# Patient Record
Sex: Male | Born: 1945
Health system: Southern US, Community
[De-identification: ages and names within clinical notes are randomized; demographics above are authoritative.]

## PROBLEM LIST (undated history)

## (undated) DIAGNOSIS — N189 Chronic kidney disease, unspecified: Secondary | ICD-10-CM

## (undated) DIAGNOSIS — M549 Dorsalgia, unspecified: Secondary | ICD-10-CM

## (undated) DIAGNOSIS — Z01810 Encounter for preprocedural cardiovascular examination: Secondary | ICD-10-CM

## (undated) DIAGNOSIS — IMO0001 Reserved for inherently not codable concepts without codable children: Secondary | ICD-10-CM

## (undated) DIAGNOSIS — J449 Chronic obstructive pulmonary disease, unspecified: Secondary | ICD-10-CM

## (undated) DIAGNOSIS — D649 Anemia, unspecified: Secondary | ICD-10-CM

## (undated) DIAGNOSIS — F0390 Unspecified dementia without behavioral disturbance: Secondary | ICD-10-CM

## (undated) DIAGNOSIS — I6529 Occlusion and stenosis of unspecified carotid artery: Secondary | ICD-10-CM

## (undated) DIAGNOSIS — J45909 Unspecified asthma, uncomplicated: Secondary | ICD-10-CM

## (undated) DIAGNOSIS — J810 Acute pulmonary edema: Secondary | ICD-10-CM

## (undated) DIAGNOSIS — M1990 Unspecified osteoarthritis, unspecified site: Secondary | ICD-10-CM

## (undated) DIAGNOSIS — E119 Type 2 diabetes mellitus without complications: Secondary | ICD-10-CM

## (undated) DIAGNOSIS — M79605 Pain in left leg: Secondary | ICD-10-CM

## (undated) DIAGNOSIS — Z8719 Personal history of other diseases of the digestive system: Secondary | ICD-10-CM

## (undated) DIAGNOSIS — J9601 Acute respiratory failure with hypoxia: Secondary | ICD-10-CM

## (undated) DIAGNOSIS — J189 Pneumonia, unspecified organism: Secondary | ICD-10-CM

## (undated) DIAGNOSIS — N183 Chronic kidney disease, stage 3 (moderate): Secondary | ICD-10-CM

## (undated) DIAGNOSIS — K219 Gastro-esophageal reflux disease without esophagitis: Secondary | ICD-10-CM

## (undated) DIAGNOSIS — I2510 Atherosclerotic heart disease of native coronary artery without angina pectoris: Secondary | ICD-10-CM

## (undated) DIAGNOSIS — R413 Other amnesia: Secondary | ICD-10-CM

## (undated) DIAGNOSIS — S0990XA Unspecified injury of head, initial encounter: Secondary | ICD-10-CM

## (undated) DIAGNOSIS — R200 Anesthesia of skin: Secondary | ICD-10-CM

## (undated) DIAGNOSIS — I10 Essential (primary) hypertension: Secondary | ICD-10-CM

## (undated) DIAGNOSIS — M109 Gout, unspecified: Secondary | ICD-10-CM

## (undated) DIAGNOSIS — I469 Cardiac arrest, cause unspecified: Secondary | ICD-10-CM

## (undated) DIAGNOSIS — I499 Cardiac arrhythmia, unspecified: Secondary | ICD-10-CM

## (undated) DIAGNOSIS — N186 End stage renal disease: Secondary | ICD-10-CM

## (undated) DIAGNOSIS — I739 Peripheral vascular disease, unspecified: Secondary | ICD-10-CM

## (undated) DIAGNOSIS — Z961 Presence of intraocular lens: Secondary | ICD-10-CM

## (undated) DIAGNOSIS — Z8711 Personal history of peptic ulcer disease: Secondary | ICD-10-CM

## (undated) DIAGNOSIS — R42 Dizziness and giddiness: Secondary | ICD-10-CM

## (undated) DIAGNOSIS — E785 Hyperlipidemia, unspecified: Secondary | ICD-10-CM

## (undated) DIAGNOSIS — Z973 Presence of spectacles and contact lenses: Secondary | ICD-10-CM

## (undated) DIAGNOSIS — A159 Respiratory tuberculosis unspecified: Secondary | ICD-10-CM

## (undated) DIAGNOSIS — D3132 Benign neoplasm of left choroid: Secondary | ICD-10-CM

## (undated) DIAGNOSIS — R570 Cardiogenic shock: Secondary | ICD-10-CM

## (undated) DIAGNOSIS — G8929 Other chronic pain: Secondary | ICD-10-CM

## (undated) DIAGNOSIS — H3322 Serous retinal detachment, left eye: Secondary | ICD-10-CM

## (undated) DIAGNOSIS — Z860100 Personal history of colon polyps, unspecified: Secondary | ICD-10-CM

## (undated) DIAGNOSIS — M79604 Pain in right leg: Secondary | ICD-10-CM

## (undated) DIAGNOSIS — F32A Depression, unspecified: Secondary | ICD-10-CM

## (undated) DIAGNOSIS — M4727 Other spondylosis with radiculopathy, lumbosacral region: Secondary | ICD-10-CM

## (undated) DIAGNOSIS — F329 Major depressive disorder, single episode, unspecified: Secondary | ICD-10-CM

## (undated) DIAGNOSIS — Z48812 Encounter for surgical aftercare following surgery on the circulatory system: Secondary | ICD-10-CM

## (undated) DIAGNOSIS — G9340 Encephalopathy, unspecified: Secondary | ICD-10-CM

## (undated) HISTORY — DX: Chronic kidney disease, stage 3 (moderate): N18.3

## (undated) HISTORY — DX: Occlusion and stenosis of unspecified carotid artery: I65.29

## (undated) HISTORY — PX: LUMBAR EPIDURAL INJECTION: SHX1980

## (undated) HISTORY — DX: Cardiac arrest, cause unspecified: I46.9

## (undated) HISTORY — DX: Encounter for surgical aftercare following surgery on the circulatory system: Z48.812

## (undated) HISTORY — DX: Serous retinal detachment, left eye: H33.22

## (undated) HISTORY — DX: Cardiogenic shock: R57.0

## (undated) HISTORY — DX: Presence of intraocular lens: Z96.1

## (undated) HISTORY — DX: Other spondylosis with radiculopathy, lumbosacral region: M47.27

## (undated) HISTORY — DX: Type 2 diabetes mellitus without complications: E11.9

## (undated) HISTORY — PX: EYE SURGERY: SHX253

## (undated) HISTORY — DX: Encounter for preprocedural cardiovascular examination: Z01.810

## (undated) HISTORY — DX: Cardiac arrhythmia, unspecified: I49.9

## (undated) HISTORY — DX: Pneumonia, unspecified organism: J18.9

## (undated) HISTORY — DX: Benign neoplasm of left choroid: D31.32

## (undated) HISTORY — DX: Unspecified asthma, uncomplicated: J45.909

## (undated) HISTORY — DX: Essential (primary) hypertension: I10

## (undated) HISTORY — DX: Chronic obstructive pulmonary disease, unspecified: J44.9

## (undated) HISTORY — DX: Hyperlipidemia, unspecified: E78.5

## (undated) HISTORY — DX: Atherosclerotic heart disease of native coronary artery without angina pectoris: I25.10

## (undated) HISTORY — DX: Acute respiratory failure with hypoxia: J96.01

## (undated) HISTORY — DX: Chronic kidney disease, unspecified: N18.9

## (undated) HISTORY — DX: Peripheral vascular disease, unspecified: I73.9

## (undated) HISTORY — DX: Acute pulmonary edema: J81.0

## (undated) HISTORY — DX: Gout, unspecified: M10.9

## (undated) HISTORY — DX: Pain in left leg: M79.605

## (undated) HISTORY — DX: Dizziness and giddiness: R42

## (undated) HISTORY — PX: TONSILLECTOMY: SUR1361

## (undated) HISTORY — DX: Encephalopathy, unspecified: G93.40

## (undated) HISTORY — PX: OTHER SURGICAL HISTORY: SHX169

## (undated) HISTORY — DX: Pain in right leg: M79.604

## (undated) HISTORY — PX: COLONOSCOPY: SHX174

---

## 1978-12-17 HISTORY — PX: JOINT REPLACEMENT: SHX530

## 1997-10-17 HISTORY — PX: COLONOSCOPY: SHX174

## 1998-08-08 HISTORY — PX: OTHER SURGICAL HISTORY: SHX169

## 2004-12-17 DIAGNOSIS — A159 Respiratory tuberculosis unspecified: Secondary | ICD-10-CM | POA: Insufficient documentation

## 2004-12-17 HISTORY — DX: Respiratory tuberculosis unspecified: A15.9

## 2007-11-05 ENCOUNTER — Ambulatory Visit: Payer: Self-pay | Admitting: Vascular Surgery

## 2008-10-20 ENCOUNTER — Ambulatory Visit: Payer: Self-pay | Admitting: Vascular Surgery

## 2010-10-19 ENCOUNTER — Ambulatory Visit: Payer: Self-pay | Admitting: Vascular Surgery

## 2010-10-31 ENCOUNTER — Encounter: Payer: Self-pay | Admitting: Vascular Surgery

## 2010-10-31 ENCOUNTER — Inpatient Hospital Stay (HOSPITAL_COMMUNITY): Admission: RE | Admit: 2010-10-31 | Discharge: 2010-11-01 | Payer: Self-pay | Admitting: Vascular Surgery

## 2010-10-31 ENCOUNTER — Ambulatory Visit: Payer: Self-pay | Admitting: Vascular Surgery

## 2010-10-31 HISTORY — PX: CAROTID ENDARTERECTOMY: SUR193

## 2010-11-16 ENCOUNTER — Ambulatory Visit: Payer: Self-pay | Admitting: Vascular Surgery

## 2011-02-27 LAB — BASIC METABOLIC PANEL
CO2: 24 mEq/L (ref 19–32)
Chloride: 103 mEq/L (ref 96–112)
Glucose, Bld: 122 mg/dL — ABNORMAL HIGH (ref 70–99)
Potassium: 3.6 mEq/L (ref 3.5–5.1)
Sodium: 136 mEq/L (ref 135–145)

## 2011-02-27 LAB — CBC
HCT: 34.7 % — ABNORMAL LOW (ref 39.0–52.0)
HCT: 39.6 % (ref 39.0–52.0)
Hemoglobin: 11.5 g/dL — ABNORMAL LOW (ref 13.0–17.0)
MCH: 29.9 pg (ref 26.0–34.0)
MCHC: 33.1 g/dL (ref 30.0–36.0)
MCHC: 34.3 g/dL (ref 30.0–36.0)
MCV: 90.1 fL (ref 78.0–100.0)
RDW: 13.8 % (ref 11.5–15.5)

## 2011-02-27 LAB — COMPREHENSIVE METABOLIC PANEL
ALT: 17 U/L (ref 0–53)
Calcium: 9.3 mg/dL (ref 8.4–10.5)
GFR calc Af Amer: 48 mL/min — ABNORMAL LOW (ref >60)
Glucose, Bld: 102 mg/dL — ABNORMAL HIGH (ref 70–99)
Sodium: 140 mEq/L (ref 135–145)
Total Protein: 7.2 g/dL (ref 6.0–8.3)

## 2011-02-27 LAB — URINALYSIS, ROUTINE W REFLEX MICROSCOPIC
Bilirubin Urine: NEGATIVE
Ketones, ur: NEGATIVE mg/dL
Nitrite: NEGATIVE
Protein, ur: NEGATIVE mg/dL
Specific Gravity, Urine: 1.021 (ref 1.005–1.030)
Urobilinogen, UA: 0.2 mg/dL (ref 0.0–1.0)

## 2011-02-27 LAB — SURGICAL PCR SCREEN
MRSA, PCR: NEGATIVE
Staphylococcus aureus: NEGATIVE

## 2011-02-27 LAB — TYPE AND SCREEN: Antibody Screen: NEGATIVE

## 2011-02-27 LAB — PROTIME-INR: Prothrombin Time: 12.6 seconds (ref 11.6–15.2)

## 2011-05-01 NOTE — Procedures (Signed)
CAROTID DUPLEX EXAM   INDICATION:  Follow up carotid artery disease.   HISTORY:  Diabetes:  no  Cardiac:  arrhythmias  Hypertension:  yes  Smoking:  yes  Previous Surgery:  Bilateral femoral artery stents 07/1998  CV History:  Currently asymptomatic  Amaurosis Fugax  , Paresthesias  , Hemiparesis                                       RIGHT             LEFT  Brachial systolic pressure:  Brachial Doppler waveforms:  Vertebral direction of flow:        atypical          antegrade  DUPLEX VELOCITIES (cm/sec)  CCA peak systolic                   M=214, D=230      86  ECA peak systolic                   241               [id number]  ICA peak systolic                   218               [id number]  ICA end diastolic                   45                169  PLAQUE MORPHOLOGY:                  Heterogenous      heterogenous  PLAQUE AMOUNT:                      moderate          severe  PLAQUE LOCATION:                    ICA/ECA/CCA       ICA/ECA/CCA   IMPRESSION:  1. Right internal carotid artery velocities indicate a 40% to 59%      stenosis.  2. Left internal carotid artery velocities indicate an 80% to 99%      stenosis.  3. Right external carotid artery stenosis.   ___________________________________________  Jessy Oto Fields, MD   EM/MEDQ  D:  10/19/2010  T:  10/19/2010  Job:  [id number]

## 2011-05-01 NOTE — Procedures (Signed)
LOWER EXTREMITY ARTERIAL DUPLEX   INDICATION:  Follow up bilateral stent placement.   HISTORY:  Diabetes:  No  Cardiac:  No  Hypertension:  Yes  Smoking:  Yes  Previous Surgery:  Bilateral femoral stents, August 2009.   SINGLE LEVEL ARTERIAL EXAM                          RIGHT                LEFT  Brachial:               167                  160  Anterior tibial:        158                  160  Posterior tibial:       158                  175  Peroneal:  Ankle/Brachial Index:   0.95                 0.96   LOWER EXTREMITY ARTERIAL DUPLEX EXAM   DUPLEX:   IMPRESSION:  1. Ankle-brachial indices are within normal limits with biphasic      waveforms on the right and triphasic waveforms on the left.  2. Patent bilateral femoral artery stents with velocities shown on the      following worksheet.   ___________________________________________  Jessy Oto. Fields, MD   EM/MEDQ  D:  10/19/2010  T:  10/19/2010  Job:  [id number]

## 2011-05-01 NOTE — Procedures (Signed)
CAROTID DUPLEX EXAM   INDICATION:  Followup of known carotid artery disease.  Patient is  asymptomatic.   HISTORY:  Diabetes:  No.  Cardiac:  Arrhythmia.  Hypertension:  Yes.  Smoking:  Yes.  Less than 1/2 pack a day.  Previous Surgery:  Bilateral femoral stents in August 1999.  CV History:  Amaurosis Fugax No, Paresthesias No, Hemiparesis No                                        RIGHT             LEFT  Brachial systolic pressure:         118               118  Brachial Doppler waveforms:         WNL               WNL  Vertebral direction of flow:        Antegrade         Antegrade  DUPLEX VELOCITIES (cm/sec)  CCA peak systolic                   214 (DIST)        [id number]  ECA peak systolic                   186               123456  ICA peak systolic                   133               Q000111Q  ICA end diastolic                   46                37  PLAQUE MORPHOLOGY:                  Heterogeneous     Heterogeneous  PLAQUE AMOUNT:                      Moderate          Moderate  PLAQUE LOCATION:                    CCA, bifurcation, ICA.              Bifurcation, PICA.   IMPRESSION:  1. Bilateral 40% to 59% ICA stenosis.  2. Mild right ECA stenosis.  3. Elevated velocity with plaque noted throughout the right CCA.  4. Bilateral antegrade flow in vertebral arteries, however, right      vertebral waveform is sloped suggestive of more proximal disease.   ___________________________________________  Jessy Oto. Fields, MD   PB/MEDQ  D:  11/05/2007  T:  11/06/2007  Job:  [id number]

## 2011-05-01 NOTE — Assessment & Plan Note (Signed)
OFFICE VISIT   Joseph Hebert, Joseph Hebert  DOB:  [birth year]                                       10/19/2010  [id number]   CHIEF COMPLAINT:  Leg pain.   HISTORY OF PRESENT ILLNESS:  The patient is a 65 year old male referred  by Dr. Delena Bali for evaluation of bilateral lower extremity pain.  He  complains of a burning sensation in his right leg greater than his left  leg.  He apparently had a left superficial femoral artery stent placed  in 1999 and he had a right superficial femoral artery stent placed in  1999.  Both of these were done at the [facility].  He currently is able  to walk 30-40 yards before experiencing burning in his lower  extremities.  He can take about 15 steps sometimes before this occurs.  He is a current tobacco user and smokes 1 pack per day.  Several minutes  were spent today regarding smoking cessation counseling.  However, the  patient has stated he does not wish to quit.  He denies any rest pain or  ulcerations on his feet.   Chronic medical problems include hypertension, elevated cholesterol, and  COPD.  These are all currently controlled, followed by Dr. Delena Bali.   Of note, he does have a history of known carotid artery stenosis.  His  last carotid duplex was in 2009.  He has not had a duplex since then.   PAST SURGICAL HISTORY:  He had a right knee operation in 1980.  He had  the aforementioned stents placed.   SOCIAL HISTORY:  He is retired.  He has 2 children.  He is married.  He  smokes 1 pack of cigarettes per day.  He does not consume alcohol  regularly.   FAMILY HISTORY:  Remarkable for no significant peripheral vascular  disease or coronary artery disease at an early age.   REVIEW OF SYSTEMS:  Full 12 point review of systems was performed with  the patient today.  Please see intake referral form for details  regarding this.   MEDICATIONS:  Medications include Aricept, Crestor, Trilipix, Lovaza,  Namenda, Spiriva,  metoprolol, Sular, Pro Air, multivitamin, Celebrex,  aspirin, Bentyl and nisoldipine.   ALLERGIES:  He has no known drug allergies.   PHYSICAL EXAMINATION:  Blood pressure is 128/79 in the left arm, heart  rate 54 and regular, oxygen saturation 99%.  HEENT:  Unremarkable.  NECK:  Has 2+ carotid pulses with a right-sided carotid bruit.  CHEST:  Clear to auscultation.  CARDIAC:  Regular rate and rhythm.  ABDOMEN:  Slightly obese, soft, nontender, nondistended.  No masses.  EXTREMITIES:  He has 2+ femoral pulses bilaterally.  He has a 2+  dorsalis pedis pulse on the left.  He has a 2+ dorsalis pedis pulse on  the right.  He has a 1+ posterior tibial pulse on the left.  He has an  absent posterior tibial pulse on the right.  He has slight decreased  sensation in both feet.  NEUROLOGIC:  Exam shows symmetric upper extremity and lower extremity  motor strength and is normal except for the decreased sensation.  SKIN:  Has no open ulcers or rashes.  MUSCULOSKELETAL:  Exam shows no major joint deformities.   He had bilateral ABIs performed today which were 0.95 on the right and  0.96 on  the left.  Duplex ultrasound showed that the superficial femoral  artery stents were widely patent bilaterally.  There are no  significantly increased velocities.   He also had a carotid duplex exam today since it had been several years  since his last duplex and today's carotid duplex exam shows a 40-60%  right internal carotid artery stenosis and greater than 80% left  internal carotid artery stenosis with an end-diastolic velocity of 123XX123  cm/sec.   In summary, Mr. Mccalip has bilateral lower extremity pain.  He has ABIs  which are not really consistent with vascular disease as the cause of  his pain.  I am suspicious that he may have some component of  neuropathy.  He may have very mild PAD overall but I do not believe this  needs intervention currently.  I believe the best option for this would  again  be smoking cessation which is not interested in.  The other option  is for him to continue walking and try to walk at least 30 minutes daily  to improve his exercise tolerance overall.   As far as his high-grade left carotid stenosis is concerned, I believe  this warrants carotid endarterectomy for stroke prophylaxis.  I have  discussed with the patient today neurologic symptoms such as amaurosis,  TIA or stroke symptoms.  He denies any of these.  I believe the best  option for him would be consideration for carotid endarterectomy.  In  light of this, we will send him to [facility] her cardiology evaluation  and risk stratification.  We will refer him to Dr. Amada Jupiter for this.  We  will schedule Mr. Galey for his carotid endarterectomy on Monday  October 30, 2010 pending the results of his cardiac evaluation.     Jessy Oto. Fields, MD  Electronically Signed   CEF/MEDQ  D:  10/26/2010  T:  10/26/2010  Job:  [id number]   cc:   [facility]  Dr. Joan Flores

## 2011-05-01 NOTE — Assessment & Plan Note (Signed)
OFFICE VISIT   SHEEL, PANTHER  DOB:  1946/03/14                                       11/16/2010  [id number]   The patient returns for follow-up today.  He underwent left carotid  endarterectomy on November 15.  He had a fairly high carotid  bifurcation, which required fairly extensive mobilization of his  hypoglossal nerve but he seems to have recovered well from this.  He  denies any problems with upper extremity or lower extremity motor  deficits.  He has no trouble swallowing.  He does complain of some pain  around his posterior mandible area on the left side.  He also has a  small amount of swelling at the upper aspect of the incision and some  numbness under his chin and the anterior aspect of his left neck.  Of  note, he also was admitted postoperatively to [facility] for a  pneumonia but is now recovered from this.  He is currently taking  aspirin 81 mg once daily.   PHYSICAL EXAMINATION:  Blood pressure is 116/70 in the right arm, 131/73  in the left arm, temperature is 98, heart rate is 66 and regular.  Left  neck has a well-healed carotid incision.  There is a 2 x 2-cm fluid  collection at the upper aspect.  There is no erythema or drainage from  this.  Most likely this probably represents a small area of hematoma or  seroma.  It does not have any evidence of infection.  Chest is clear to  auscultation on exam today.  He apparently also a recent chest x-ray  which showed his pneumonia had resolved.  Upper extremity and lower  extremity strength is 5/5.  Tongue has very slight deviation to the left  but is essentially midline.   The patient has recovered well from his carotid endarterectomy.  He  still has some numbness on the anterior aspect of his left neck.  Most  likely this should resolve with time.  Some of the TMJ pain also should  continue to resolve with time.  However, due to the high nature of his  lesion, I am not  surprised that he has some of these findings.  Usually  all these should resolve over time.  He will return for follow-up for a  carotid duplex scan and an arterial duplex of his previous superficial  femoral artery stents in 6 months' time.  He will return sooner if he  has any further problems.     Jessy Oto. Fields, MD  Electronically Signed   CEF/MEDQ  D:  11/16/2010  T:  11/17/2010  Job:  [id number]   cc:   Dr. Nelda Bucks, [place]  Dr. Shirlee More, [facility]

## 2011-05-01 NOTE — Procedures (Signed)
CAROTID DUPLEX EXAM   INDICATION:  Follow up known carotid artery disease.   HISTORY:  Diabetes:  No.  Cardiac:  Arrhythmias.  Hypertension:  Yes.  Smoking:  Yes.  Previous Surgery:  Bilateral femoral stents in August, 1999.  CV History:  Amaurosis Fugax No, Paresthesias No, Hemiparesis No.                                       RIGHT             LEFT  Brachial systolic pressure:         110               140  Brachial Doppler waveforms:         Biphasic          Biphasic  Vertebral direction of flow:        Antegrade         Antegrade  DUPLEX VELOCITIES (cm/sec)  CCA peak systolic                   217 (mid)         [id number]  ECA peak systolic                   202               Q000111Q  ICA peak systolic                   234               Q000111Q  ICA end diastolic                   47                59  PLAQUE MORPHOLOGY:                  Heterogenous      Heterogenous  PLAQUE AMOUNT:                      Moderate          Moderate  PLAQUE LOCATION:                    ICA, ECA          ICA, ECA   IMPRESSION:  1. 60-79% stenosis noted in bilateral internal carotid arteries.  2. Antegrade bilateral vertebral arteries.       ___________________________________________  Jessy Oto Fields, MD   MG/MEDQ  D:  10/20/2008  T:  10/20/2008  Job:  [id number]

## 2011-06-14 ENCOUNTER — Other Ambulatory Visit: Payer: Self-pay

## 2011-06-15 ENCOUNTER — Encounter (INDEPENDENT_AMBULATORY_CARE_PROVIDER_SITE_OTHER): Payer: Medicare Other

## 2011-06-15 ENCOUNTER — Other Ambulatory Visit (INDEPENDENT_AMBULATORY_CARE_PROVIDER_SITE_OTHER): Payer: Medicare Other

## 2011-06-15 DIAGNOSIS — Z48812 Encounter for surgical aftercare following surgery on the circulatory system: Secondary | ICD-10-CM

## 2011-06-15 DIAGNOSIS — I6529 Occlusion and stenosis of unspecified carotid artery: Secondary | ICD-10-CM

## 2011-06-15 DIAGNOSIS — I739 Peripheral vascular disease, unspecified: Secondary | ICD-10-CM

## 2011-06-15 NOTE — Procedures (Unsigned)
LOWER EXTREMITY ARTERIAL DUPLEX  INDICATION:  Followup bilateral stents.  HISTORY: Diabetes:  No. Cardiac:  No. Hypertension:  Yes. Smoking:  Yes. Previous Surgery:  Bilateral iliofemoral stents 1999 in the [place]                         RIGHT                LEFT Brachial:               165                  177 Anterior tibial:        190                  196 Posterior tibial:       195                  199 Peroneal: Ankle/Brachial Index:   1.10                 1.12  LOWER EXTREMITY ARTERIAL DUPLEX EXAM  DUPLEX:  Patent iliofemoral stents with mildly increased velocities and broadened waveforms.  IMPRESSION:  Normal ankle brachial index.  Patent stents as described above.  ___________________________________________ Jessy Oto. Fields, MD  OD/MEDQ  D:  06/15/2011  T:  06/15/2011  Job:  [id number]

## 2011-06-26 NOTE — Procedures (Unsigned)
CAROTID DUPLEX EXAM  INDICATION:  Carotid artery disease.  HISTORY: Diabetes:  No. Cardiac:  Arrhythmia. Hypertension:  Yes. Smoking:  Yes. Previous Surgery:  Left carotid endarterectomy 10/31/2010 Dr. [name]. CV History:  The patient is currently asymptomatic. Amaurosis Fugax No, Paresthesias No, Hemiparesis No                                      RIGHT             LEFT Brachial systolic pressure:         165               177 Brachial Doppler waveforms:         WNL               WNL Vertebral direction of flow:        Antegrade         Antegrade DUPLEX VELOCITIES (cm/sec) CCA peak systolic                   186               90 ECA peak systolic                   161               [id number] ICA peak systolic                   172               [id number] ICA end diastolic                   43                36 PLAQUE MORPHOLOGY:                  Heterogeneous PLAQUE AMOUNT:                      Moderate PLAQUE LOCATION:                    ICA, CCA, ECA  IMPRESSION:  Right side 40%-59% stenosis within internal carotid artery. Increased velocities within the right common carotid artery suggestive of stenosis.  Right side intimal thickening within the common carotid artery.  Right external carotid artery stenosis.  Patent left endarterectomy with 40%-59%stenosis.  This could be secondary to change in vessel diameter due to post surgical changes.  Study stable compared to previous.  ___________________________________________ Jessy Oto Fields, MD  OD/MEDQ  D:  06/15/2011  T:  06/15/2011  Job:  [id number]

## 2011-12-17 ENCOUNTER — Other Ambulatory Visit (INDEPENDENT_AMBULATORY_CARE_PROVIDER_SITE_OTHER): Payer: Medicare Other | Admitting: *Deleted

## 2011-12-17 ENCOUNTER — Other Ambulatory Visit: Payer: Medicare Other

## 2011-12-17 ENCOUNTER — Ambulatory Visit (INDEPENDENT_AMBULATORY_CARE_PROVIDER_SITE_OTHER): Payer: Medicare Other | Admitting: *Deleted

## 2011-12-17 DIAGNOSIS — Z48812 Encounter for surgical aftercare following surgery on the circulatory system: Secondary | ICD-10-CM

## 2011-12-17 DIAGNOSIS — I6529 Occlusion and stenosis of unspecified carotid artery: Secondary | ICD-10-CM

## 2011-12-17 DIAGNOSIS — I739 Peripheral vascular disease, unspecified: Secondary | ICD-10-CM

## 2011-12-31 ENCOUNTER — Other Ambulatory Visit: Payer: Self-pay | Admitting: *Deleted

## 2011-12-31 ENCOUNTER — Encounter: Payer: Self-pay | Admitting: Vascular Surgery

## 2011-12-31 DIAGNOSIS — I739 Peripheral vascular disease, unspecified: Secondary | ICD-10-CM

## 2011-12-31 DIAGNOSIS — Z48812 Encounter for surgical aftercare following surgery on the circulatory system: Secondary | ICD-10-CM

## 2011-12-31 DIAGNOSIS — I6529 Occlusion and stenosis of unspecified carotid artery: Secondary | ICD-10-CM

## 2011-12-31 NOTE — Procedures (Unsigned)
CAROTID DUPLEX EXAM  INDICATION:  Follow up left CEA  HISTORY: Diabetes:  No Cardiac:  Yes Hypertension:  Yes Smoking:  Yes Previous Surgery:  Left CEA 2011; bilateral lower extremity stents in 1999 CV History: Amaurosis Fugax No, Paresthesias No, Hemiparesis No                                      RIGHT             LEFT Brachial systolic pressure:         167               171 Brachial Doppler waveforms:         WNL               WNL Vertebral direction of flow:        Antegrade         Abnormal antegrade DUPLEX VELOCITIES (cm/sec) CCA peak systolic                   150               [id number] ECA peak systolic                   159               98 ICA peak systolic                   179               [id number] ICA end diastolic                   50                20 PLAQUE MORPHOLOGY:                  Heterogeneous     Heterogeneous PLAQUE AMOUNT:                      Moderate          Mild PLAQUE LOCATION:                    ICA               Subclavian  IMPRESSION: 1. 40% to 59% right ICA stenosis. 2. Patent left CEA without evidence of restenosis or hyperplasia. 3. Abnormal antegrade left vertebral artery with abnormal left     subclavian arterial waveforms which is indicative of early     subclavian steal. 4. Right vertebral artery is within normal limits.  ___________________________________________ Jessy Oto Fields, MD  LT/MEDQ  D:  12/19/2011  T:  12/19/2011  Job:  [id number]

## 2011-12-31 NOTE — Procedures (Unsigned)
AORTA-ILIAC DUPLEX EVALUATION  INDICATION:  Follow up bilateral iliac stents.  HISTORY: Diabetes:  No. Cardiac:  Yes. Hypertension:  Yes. Smoking:  Yes. Previous Surgery:  Bilateral lower extremity stent in 1999; left CEA, 2011.              SINGLE LEVEL ARTERIAL EXAM                             RIGHT                  LEFT Brachial: Anterior tibial: Posterior tibial: Peroneal: Ankle/brachial index: Previous ABI/date:  AORTA-ILIAC DUPLEX EXAM Aorta - Proximal Aorta - Mid Aorta - Distal       83 cm/s  RIGHT                                   LEFT 184 cm/s          CIA-PROXIMAL          192 cm/s 136 cm/s          CIA-DISTAL            192 cm/s                   HYPOGASTRIC 242 cm/s          EIA-PROXIMAL          196 cm/s 194 cm/s          EIA-MID               293 cm/s 201 cm/s          EIA-DISTAL            234 cm/s  CFA MIDDLE RIGHT:  189 cm/s  CFA MIDDLE LEFT:  153 cm/s  IMPRESSION: 1. Patent reported bilateral iliac stents without evidence of     restenosis or hyperplasia. 2. Previous reports and notes are unclear about actual stent     placement.  Per patient, stents are in his iliac arteries.  ___________________________________________ Jessy Oto Fields, MD  LT/MEDQ  D:  12/19/2011  T:  12/19/2011  Job:  [id number]

## 2011-12-31 NOTE — Procedures (Unsigned)
CAROTID DUPLEX EXAM  INDICATION:  Follow up left CEA, 10/2010.  HISTORY: Diabetes:  No. Cardiac:  Yes. Hypertension:  Yes. Smoking:  Yes. Previous Surgery:  Left CEA in 2011; bilateral lower extremity stents, 1999. CV History: Amaurosis Fugax No, Paresthesias No, Hemiparesis No.                                      RIGHT             LEFT Brachial systolic pressure:         167               171 Brachial Doppler waveforms:         WNL               WNL Vertebral direction of flow:        Antegrade         Abnormal antegrade DUPLEX VELOCITIES (cm/sec) CCA peak systolic                   150               [id number] ECA peak systolic                   159               98 ICA peak systolic                   179               [id number] ICA end diastolic                   50                20 PLAQUE MORPHOLOGY:                  Heterogenous      Heterogenous PLAQUE AMOUNT:                      Moderate          Mild PLAQUE LOCATION:                    ICA               Subclavian  IMPRESSION: 1. 40% to 59% right internal carotid artery stenosis. 2. Patent left carotid endarterectomy without evidence of restenosis     or hyperplasia. 3. Abnormal antegrade left vertebral artery with abnormal left     subclavian arterial waveforms is indicative of early subclavian     steal. 4. Right vertebral artery is within normal limits. 5. Stable results compared to previous examination.  ___________________________________________ Jessy Oto [name], MD  LT/MEDQ  D:  12/17/2011  T:  12/17/2011  Job:  [id number]

## 2012-01-01 ENCOUNTER — Other Ambulatory Visit: Payer: Self-pay | Admitting: *Deleted

## 2012-01-01 DIAGNOSIS — I739 Peripheral vascular disease, unspecified: Secondary | ICD-10-CM

## 2012-01-01 DIAGNOSIS — Z48812 Encounter for surgical aftercare following surgery on the circulatory system: Secondary | ICD-10-CM

## 2012-01-01 DIAGNOSIS — I6529 Occlusion and stenosis of unspecified carotid artery: Secondary | ICD-10-CM

## 2012-01-21 DIAGNOSIS — Z125 Encounter for screening for malignant neoplasm of prostate: Secondary | ICD-10-CM | POA: Diagnosis not present

## 2012-01-21 DIAGNOSIS — I10 Essential (primary) hypertension: Secondary | ICD-10-CM | POA: Diagnosis not present

## 2012-01-21 DIAGNOSIS — J449 Chronic obstructive pulmonary disease, unspecified: Secondary | ICD-10-CM | POA: Diagnosis not present

## 2012-01-21 DIAGNOSIS — E785 Hyperlipidemia, unspecified: Secondary | ICD-10-CM | POA: Diagnosis not present

## 2012-01-21 DIAGNOSIS — Z79899 Other long term (current) drug therapy: Secondary | ICD-10-CM | POA: Diagnosis not present

## 2012-01-21 DIAGNOSIS — Z6829 Body mass index (BMI) 29.0-29.9, adult: Secondary | ICD-10-CM | POA: Diagnosis not present

## 2012-01-21 DIAGNOSIS — I6529 Occlusion and stenosis of unspecified carotid artery: Secondary | ICD-10-CM | POA: Diagnosis not present

## 2012-01-21 DIAGNOSIS — E119 Type 2 diabetes mellitus without complications: Secondary | ICD-10-CM | POA: Diagnosis not present

## 2012-01-31 DIAGNOSIS — Z79899 Other long term (current) drug therapy: Secondary | ICD-10-CM | POA: Diagnosis not present

## 2012-03-06 DIAGNOSIS — M25519 Pain in unspecified shoulder: Secondary | ICD-10-CM | POA: Diagnosis not present

## 2012-03-06 DIAGNOSIS — M75120 Complete rotator cuff tear or rupture of unspecified shoulder, not specified as traumatic: Secondary | ICD-10-CM | POA: Diagnosis not present

## 2012-03-11 DIAGNOSIS — M25519 Pain in unspecified shoulder: Secondary | ICD-10-CM | POA: Diagnosis not present

## 2012-03-11 DIAGNOSIS — M7530 Calcific tendinitis of unspecified shoulder: Secondary | ICD-10-CM | POA: Diagnosis not present

## 2012-04-23 DIAGNOSIS — Z6829 Body mass index (BMI) 29.0-29.9, adult: Secondary | ICD-10-CM | POA: Diagnosis not present

## 2012-04-23 DIAGNOSIS — I6529 Occlusion and stenosis of unspecified carotid artery: Secondary | ICD-10-CM | POA: Diagnosis not present

## 2012-04-23 DIAGNOSIS — I10 Essential (primary) hypertension: Secondary | ICD-10-CM | POA: Diagnosis not present

## 2012-04-23 DIAGNOSIS — E785 Hyperlipidemia, unspecified: Secondary | ICD-10-CM | POA: Diagnosis not present

## 2012-04-23 DIAGNOSIS — E119 Type 2 diabetes mellitus without complications: Secondary | ICD-10-CM | POA: Diagnosis not present

## 2012-06-23 DIAGNOSIS — L02219 Cutaneous abscess of trunk, unspecified: Secondary | ICD-10-CM | POA: Diagnosis not present

## 2012-06-23 DIAGNOSIS — Z6830 Body mass index (BMI) 30.0-30.9, adult: Secondary | ICD-10-CM | POA: Diagnosis not present

## 2012-07-09 DIAGNOSIS — L03319 Cellulitis of trunk, unspecified: Secondary | ICD-10-CM | POA: Diagnosis not present

## 2012-07-09 DIAGNOSIS — L089 Local infection of the skin and subcutaneous tissue, unspecified: Secondary | ICD-10-CM | POA: Diagnosis not present

## 2012-07-09 DIAGNOSIS — L723 Sebaceous cyst: Secondary | ICD-10-CM | POA: Diagnosis not present

## 2012-07-28 DIAGNOSIS — S41109A Unspecified open wound of unspecified upper arm, initial encounter: Secondary | ICD-10-CM | POA: Diagnosis not present

## 2012-07-28 DIAGNOSIS — I739 Peripheral vascular disease, unspecified: Secondary | ICD-10-CM | POA: Diagnosis not present

## 2012-07-28 DIAGNOSIS — E785 Hyperlipidemia, unspecified: Secondary | ICD-10-CM | POA: Diagnosis not present

## 2012-07-28 DIAGNOSIS — E119 Type 2 diabetes mellitus without complications: Secondary | ICD-10-CM | POA: Diagnosis not present

## 2012-07-28 DIAGNOSIS — Z23 Encounter for immunization: Secondary | ICD-10-CM | POA: Diagnosis not present

## 2012-07-28 DIAGNOSIS — I10 Essential (primary) hypertension: Secondary | ICD-10-CM | POA: Diagnosis not present

## 2012-07-28 DIAGNOSIS — Z6830 Body mass index (BMI) 30.0-30.9, adult: Secondary | ICD-10-CM | POA: Diagnosis not present

## 2012-08-06 DIAGNOSIS — H04129 Dry eye syndrome of unspecified lacrimal gland: Secondary | ICD-10-CM | POA: Diagnosis not present

## 2012-08-06 DIAGNOSIS — H2510 Age-related nuclear cataract, unspecified eye: Secondary | ICD-10-CM | POA: Diagnosis not present

## 2012-08-06 DIAGNOSIS — H2589 Other age-related cataract: Secondary | ICD-10-CM | POA: Diagnosis not present

## 2012-08-27 DIAGNOSIS — H2510 Age-related nuclear cataract, unspecified eye: Secondary | ICD-10-CM | POA: Diagnosis not present

## 2012-08-27 DIAGNOSIS — H52209 Unspecified astigmatism, unspecified eye: Secondary | ICD-10-CM | POA: Diagnosis not present

## 2012-08-27 DIAGNOSIS — H2589 Other age-related cataract: Secondary | ICD-10-CM | POA: Diagnosis not present

## 2012-11-03 DIAGNOSIS — Z23 Encounter for immunization: Secondary | ICD-10-CM | POA: Diagnosis not present

## 2012-11-03 DIAGNOSIS — Z6830 Body mass index (BMI) 30.0-30.9, adult: Secondary | ICD-10-CM | POA: Diagnosis not present

## 2012-11-03 DIAGNOSIS — E785 Hyperlipidemia, unspecified: Secondary | ICD-10-CM | POA: Diagnosis not present

## 2012-11-03 DIAGNOSIS — R5383 Other fatigue: Secondary | ICD-10-CM | POA: Diagnosis not present

## 2012-11-03 DIAGNOSIS — R5381 Other malaise: Secondary | ICD-10-CM | POA: Diagnosis not present

## 2012-11-03 DIAGNOSIS — I10 Essential (primary) hypertension: Secondary | ICD-10-CM | POA: Diagnosis not present

## 2012-11-03 DIAGNOSIS — E119 Type 2 diabetes mellitus without complications: Secondary | ICD-10-CM | POA: Diagnosis not present

## 2012-11-04 DIAGNOSIS — I10 Essential (primary) hypertension: Secondary | ICD-10-CM | POA: Diagnosis not present

## 2012-11-04 DIAGNOSIS — I6529 Occlusion and stenosis of unspecified carotid artery: Secondary | ICD-10-CM | POA: Diagnosis not present

## 2012-11-04 DIAGNOSIS — E119 Type 2 diabetes mellitus without complications: Secondary | ICD-10-CM | POA: Diagnosis not present

## 2012-11-04 DIAGNOSIS — E785 Hyperlipidemia, unspecified: Secondary | ICD-10-CM | POA: Diagnosis not present

## 2012-11-04 DIAGNOSIS — F17200 Nicotine dependence, unspecified, uncomplicated: Secondary | ICD-10-CM | POA: Diagnosis not present

## 2012-11-12 DIAGNOSIS — R9431 Abnormal electrocardiogram [ECG] [EKG]: Secondary | ICD-10-CM | POA: Diagnosis not present

## 2012-11-12 DIAGNOSIS — R0789 Other chest pain: Secondary | ICD-10-CM | POA: Diagnosis not present

## 2012-11-17 DIAGNOSIS — R55 Syncope and collapse: Secondary | ICD-10-CM | POA: Diagnosis not present

## 2012-11-17 DIAGNOSIS — J449 Chronic obstructive pulmonary disease, unspecified: Secondary | ICD-10-CM | POA: Diagnosis not present

## 2012-11-17 DIAGNOSIS — I1310 Hypertensive heart and chronic kidney disease without heart failure, with stage 1 through stage 4 chronic kidney disease, or unspecified chronic kidney disease: Secondary | ICD-10-CM | POA: Diagnosis not present

## 2012-11-17 DIAGNOSIS — N390 Urinary tract infection, site not specified: Secondary | ICD-10-CM | POA: Diagnosis not present

## 2012-11-17 DIAGNOSIS — I2510 Atherosclerotic heart disease of native coronary artery without angina pectoris: Secondary | ICD-10-CM | POA: Diagnosis not present

## 2012-11-17 DIAGNOSIS — E785 Hyperlipidemia, unspecified: Secondary | ICD-10-CM | POA: Diagnosis not present

## 2012-11-17 DIAGNOSIS — Z7982 Long term (current) use of aspirin: Secondary | ICD-10-CM | POA: Diagnosis not present

## 2012-11-17 DIAGNOSIS — R42 Dizziness and giddiness: Secondary | ICD-10-CM | POA: Diagnosis not present

## 2012-11-17 DIAGNOSIS — N189 Chronic kidney disease, unspecified: Secondary | ICD-10-CM | POA: Diagnosis not present

## 2012-11-17 DIAGNOSIS — N289 Disorder of kidney and ureter, unspecified: Secondary | ICD-10-CM | POA: Diagnosis not present

## 2012-11-17 DIAGNOSIS — Z79899 Other long term (current) drug therapy: Secondary | ICD-10-CM | POA: Diagnosis not present

## 2012-11-17 DIAGNOSIS — J9610 Chronic respiratory failure, unspecified whether with hypoxia or hypercapnia: Secondary | ICD-10-CM | POA: Diagnosis not present

## 2012-11-17 DIAGNOSIS — K219 Gastro-esophageal reflux disease without esophagitis: Secondary | ICD-10-CM | POA: Diagnosis not present

## 2012-11-17 DIAGNOSIS — N039 Chronic nephritic syndrome with unspecified morphologic changes: Secondary | ICD-10-CM | POA: Diagnosis not present

## 2012-11-17 DIAGNOSIS — I6509 Occlusion and stenosis of unspecified vertebral artery: Secondary | ICD-10-CM | POA: Diagnosis not present

## 2012-11-17 DIAGNOSIS — I130 Hypertensive heart and chronic kidney disease with heart failure and stage 1 through stage 4 chronic kidney disease, or unspecified chronic kidney disease: Secondary | ICD-10-CM | POA: Diagnosis not present

## 2012-11-17 DIAGNOSIS — I739 Peripheral vascular disease, unspecified: Secondary | ICD-10-CM | POA: Diagnosis not present

## 2012-11-17 DIAGNOSIS — R0989 Other specified symptoms and signs involving the circulatory and respiratory systems: Secondary | ICD-10-CM | POA: Diagnosis not present

## 2012-11-17 DIAGNOSIS — I6529 Occlusion and stenosis of unspecified carotid artery: Secondary | ICD-10-CM | POA: Diagnosis not present

## 2012-11-17 DIAGNOSIS — F17200 Nicotine dependence, unspecified, uncomplicated: Secondary | ICD-10-CM | POA: Diagnosis not present

## 2012-11-18 DIAGNOSIS — E785 Hyperlipidemia, unspecified: Secondary | ICD-10-CM | POA: Diagnosis not present

## 2012-11-18 DIAGNOSIS — R42 Dizziness and giddiness: Secondary | ICD-10-CM | POA: Diagnosis not present

## 2012-11-18 DIAGNOSIS — K219 Gastro-esophageal reflux disease without esophagitis: Secondary | ICD-10-CM | POA: Diagnosis not present

## 2012-11-18 DIAGNOSIS — R55 Syncope and collapse: Secondary | ICD-10-CM | POA: Diagnosis not present

## 2012-11-18 DIAGNOSIS — I10 Essential (primary) hypertension: Secondary | ICD-10-CM | POA: Diagnosis not present

## 2012-11-18 DIAGNOSIS — R9431 Abnormal electrocardiogram [ECG] [EKG]: Secondary | ICD-10-CM | POA: Diagnosis not present

## 2012-11-18 DIAGNOSIS — J449 Chronic obstructive pulmonary disease, unspecified: Secondary | ICD-10-CM | POA: Diagnosis not present

## 2012-11-18 DIAGNOSIS — I369 Nonrheumatic tricuspid valve disorder, unspecified: Secondary | ICD-10-CM | POA: Diagnosis not present

## 2012-11-18 DIAGNOSIS — N390 Urinary tract infection, site not specified: Secondary | ICD-10-CM | POA: Diagnosis not present

## 2012-11-18 DIAGNOSIS — J9610 Chronic respiratory failure, unspecified whether with hypoxia or hypercapnia: Secondary | ICD-10-CM | POA: Diagnosis not present

## 2012-11-18 DIAGNOSIS — I2510 Atherosclerotic heart disease of native coronary artery without angina pectoris: Secondary | ICD-10-CM | POA: Diagnosis not present

## 2012-11-19 DIAGNOSIS — I2510 Atherosclerotic heart disease of native coronary artery without angina pectoris: Secondary | ICD-10-CM | POA: Diagnosis not present

## 2012-11-19 DIAGNOSIS — N390 Urinary tract infection, site not specified: Secondary | ICD-10-CM | POA: Diagnosis not present

## 2012-11-19 DIAGNOSIS — R55 Syncope and collapse: Secondary | ICD-10-CM | POA: Diagnosis not present

## 2012-11-19 DIAGNOSIS — I10 Essential (primary) hypertension: Secondary | ICD-10-CM | POA: Diagnosis not present

## 2012-12-03 DIAGNOSIS — R42 Dizziness and giddiness: Secondary | ICD-10-CM | POA: Diagnosis not present

## 2012-12-03 DIAGNOSIS — J019 Acute sinusitis, unspecified: Secondary | ICD-10-CM | POA: Diagnosis not present

## 2012-12-03 DIAGNOSIS — N390 Urinary tract infection, site not specified: Secondary | ICD-10-CM | POA: Diagnosis not present

## 2012-12-03 DIAGNOSIS — H8109 Meniere's disease, unspecified ear: Secondary | ICD-10-CM | POA: Diagnosis not present

## 2012-12-03 DIAGNOSIS — Z6831 Body mass index (BMI) 31.0-31.9, adult: Secondary | ICD-10-CM | POA: Diagnosis not present

## 2012-12-11 ENCOUNTER — Encounter: Payer: Self-pay | Admitting: Vascular Surgery

## 2012-12-29 DIAGNOSIS — Z6830 Body mass index (BMI) 30.0-30.9, adult: Secondary | ICD-10-CM | POA: Diagnosis not present

## 2012-12-29 DIAGNOSIS — I6529 Occlusion and stenosis of unspecified carotid artery: Secondary | ICD-10-CM | POA: Diagnosis not present

## 2012-12-29 DIAGNOSIS — I6509 Occlusion and stenosis of unspecified vertebral artery: Secondary | ICD-10-CM | POA: Diagnosis not present

## 2012-12-29 DIAGNOSIS — E785 Hyperlipidemia, unspecified: Secondary | ICD-10-CM | POA: Diagnosis not present

## 2012-12-31 ENCOUNTER — Encounter: Payer: Self-pay | Admitting: Vascular Surgery

## 2013-01-01 ENCOUNTER — Ambulatory Visit (INDEPENDENT_AMBULATORY_CARE_PROVIDER_SITE_OTHER): Payer: Medicare Other | Admitting: *Deleted

## 2013-01-01 ENCOUNTER — Ambulatory Visit (INDEPENDENT_AMBULATORY_CARE_PROVIDER_SITE_OTHER): Payer: Medicare Other | Admitting: Vascular Surgery

## 2013-01-01 ENCOUNTER — Other Ambulatory Visit (INDEPENDENT_AMBULATORY_CARE_PROVIDER_SITE_OTHER): Payer: Medicare Other | Admitting: *Deleted

## 2013-01-01 ENCOUNTER — Encounter: Payer: Self-pay | Admitting: Vascular Surgery

## 2013-01-01 VITALS — BP 178/87 | HR 70 | Ht 70.0 in | Wt 210.5 lb

## 2013-01-01 DIAGNOSIS — I739 Peripheral vascular disease, unspecified: Secondary | ICD-10-CM | POA: Diagnosis not present

## 2013-01-01 DIAGNOSIS — Z48812 Encounter for surgical aftercare following surgery on the circulatory system: Secondary | ICD-10-CM

## 2013-01-01 DIAGNOSIS — I6529 Occlusion and stenosis of unspecified carotid artery: Secondary | ICD-10-CM

## 2013-01-01 HISTORY — DX: Occlusion and stenosis of unspecified carotid artery: I65.29

## 2013-01-01 NOTE — Addendum Note (Signed)
Addended by: Mena Goes on: 01/01/2013 02:30 PM   Modules accepted: Orders

## 2013-01-01 NOTE — Progress Notes (Signed)
[facility] [place] HISTORY AND PHYSICAL   History of Present Illness:  Patient is a 67 y.o. year old male who presents for follow-up evaluation for carotid stenosis.  He is on Aspirin for antiplatelet therapy.  His atherosclerotic risk factors remain elevated cholesterol, hypertension, smoking.  These are all currently stable and followed by his primary care physician.  He denies any new neurologic events including amaurosis, numbness, or weakness.  He has had some dizziness episodes recently and is currently under evaluation by an ear nose and throat physician. Unfortunately he continues to smoke. He currently is smoking about 3 cigarettes per day. Greater than 3 minutes they spent regarding smoking cessation counseling.  Past Medical History  Diagnosis Date  . COPD (chronic obstructive pulmonary disease)   . Hypertension   . Hyperlipidemia   . Arrhythmia   . Carotid artery occlusion     with Claudication  . Asthma   . Vertigo     Past Surgical History  Procedure Date  . Carotid endarterectomy Nov. 15,2011    LEFT cea  . Joint replacement 1980    RIGHT  knee  . Stents Aug.  23, 1999    Bilateral iliofemoral  stents, [place].    Review of Systems:  Neurologic: as above Cardiac:denies shortness of breath or chest pain Pulmonary: denies cough or wheeze  Social History History  Substance Use Topics  . Smoking status: Current Every Day Smoker -- 0.3 packs/day    Types: Cigarettes  . Smokeless tobacco: Never Used     Comment: pt stating that he trying his best to quit  . Alcohol Use: No    Allergies  No Known Allergies   Current Outpatient Prescriptions  Medication Sig Dispense Refill  . albuterol (PROVENTIL HFA;VENTOLIN HFA) 108 (90 BASE) MCG/ACT inhaler Inhale 2 puffs into the lungs every 6 (six) hours as needed.      [name] amLODipine (NORVASC) 10 MG tablet Take 10 mg by mouth daily.      [name] aspirin 81 MG tablet Take 81 mg by mouth daily.      .  Choline Fenofibrate (TRILIPIX) 135 MG capsule Take 135 mg by mouth daily.      . cilostazol (PLETAL) 100 MG tablet Take 100 mg by mouth 2 (two) times daily.      . memantine (NAMENDA) 10 MG tablet Take 10 mg by mouth 2 (two) times daily.      . metoprolol succinate (TOPROL-XL) 50 MG 24 hr tablet Take 50 mg by mouth daily. Take with or immediately following a meal.      . Multiple Vitamin (MULTIVITAMIN) tablet Take 1 tablet by mouth daily.      [name] omega-3 acid ethyl esters (LOVAZA) 1 G capsule Take 1 g by mouth 2 (two) times daily.      . rosuvastatin (CRESTOR) 20 MG tablet Take 20 mg by mouth daily.      [name] tiotropium (SPIRIVA) 18 MCG inhalation capsule Place 18 mcg into inhaler and inhale daily.      [name] albuterol (PROVENTIL) (2.5 MG/3ML) 0.083% nebulizer solution Take 2.5 mg by nebulization every 6 (six) hours as needed.      . celecoxib (CELEBREX) 200 MG capsule Take 200 mg by mouth 2 (two) times daily.      [name] dicyclomine (BENTYL) 20 MG tablet Take 20 mg by mouth every 6 (six) hours.      [name] donepezil (ARICEPT) 10 MG tablet Take 10 mg by mouth at  bedtime as needed.      . nisoldipine (SULAR) 25.5 MG 24 hr tablet Take 25.5 mg by mouth daily.      . nisoldipine (SULAR) 25.5 MG 24 hr tablet Take 25.5 mg by mouth daily.        Physical Examination  Filed Vitals:   01/01/13 1133 01/01/13 1135  BP: 185/81 178/87  Pulse: 70   Height: 5\' 10"  (1.778 m)   Weight: 210 lb 8 oz (95.482 kg)   SpO2: 99%     Body mass index is 30.20 kg/(m^2).  General:  Alert and oriented, no acute distress HEENT: Normal Neck: No bruit or JVD Pulmonary: Clear to auscultation bilaterally Cardiac: Regular Rate and Rhythm without murmur Neurologic: Upper and lower extremity motor 5/5 and symmetric Extremities: 2+ femoral pulses bilaterally  DATA: Patient had bilateral carotid duplex exam today which I reviewed and interpreted. Showed a widely patent left carotid endarterectomy. Right carotid was 40-60%. He did have  some narrowing of the right subclavian artery but velocities were less than 300 cm/s. He had antegrade vertebral flow bilaterally. He also had bilateral ABIs which were greater than 1 bilaterally. He also had triphasic waveforms. Patient had patent iliac stents bilaterally.   ASSESSMENT: Stable peripheral arterial disease of the lower extremities. Normal ABIs at this point. Stable right moderate carotid stenosis with patent left carotid endarterectomy.   PLAN:  Continue aspirin therapy daily. Followup carotid duplex in 1 year. Try to quit smoking  Ruta Hinds, MD [facility] [place] Office: [phone] Pager: [phone]

## 2013-01-23 DIAGNOSIS — Q618 Other cystic kidney diseases: Secondary | ICD-10-CM | POA: Diagnosis not present

## 2013-01-23 DIAGNOSIS — K7689 Other specified diseases of liver: Secondary | ICD-10-CM | POA: Diagnosis not present

## 2013-01-23 DIAGNOSIS — R9389 Abnormal findings on diagnostic imaging of other specified body structures: Secondary | ICD-10-CM | POA: Diagnosis not present

## 2013-02-16 DIAGNOSIS — I10 Essential (primary) hypertension: Secondary | ICD-10-CM | POA: Diagnosis not present

## 2013-05-22 DIAGNOSIS — E119 Type 2 diabetes mellitus without complications: Secondary | ICD-10-CM | POA: Diagnosis not present

## 2013-05-22 DIAGNOSIS — E785 Hyperlipidemia, unspecified: Secondary | ICD-10-CM | POA: Diagnosis not present

## 2013-05-22 DIAGNOSIS — Z9181 History of falling: Secondary | ICD-10-CM | POA: Diagnosis not present

## 2013-05-22 DIAGNOSIS — Z125 Encounter for screening for malignant neoplasm of prostate: Secondary | ICD-10-CM | POA: Diagnosis not present

## 2013-05-22 DIAGNOSIS — Z6828 Body mass index (BMI) 28.0-28.9, adult: Secondary | ICD-10-CM | POA: Diagnosis not present

## 2013-05-22 DIAGNOSIS — I10 Essential (primary) hypertension: Secondary | ICD-10-CM | POA: Diagnosis not present

## 2013-05-22 DIAGNOSIS — Z1331 Encounter for screening for depression: Secondary | ICD-10-CM | POA: Diagnosis not present

## 2013-06-15 DIAGNOSIS — M7730 Calcaneal spur, unspecified foot: Secondary | ICD-10-CM | POA: Diagnosis not present

## 2013-06-15 DIAGNOSIS — M79609 Pain in unspecified limb: Secondary | ICD-10-CM | POA: Diagnosis not present

## 2013-06-15 DIAGNOSIS — Z6828 Body mass index (BMI) 28.0-28.9, adult: Secondary | ICD-10-CM | POA: Diagnosis not present

## 2013-08-28 DIAGNOSIS — E119 Type 2 diabetes mellitus without complications: Secondary | ICD-10-CM | POA: Diagnosis not present

## 2013-08-28 DIAGNOSIS — I10 Essential (primary) hypertension: Secondary | ICD-10-CM | POA: Diagnosis not present

## 2013-08-28 DIAGNOSIS — E785 Hyperlipidemia, unspecified: Secondary | ICD-10-CM | POA: Diagnosis not present

## 2013-08-28 DIAGNOSIS — Z6827 Body mass index (BMI) 27.0-27.9, adult: Secondary | ICD-10-CM | POA: Diagnosis not present

## 2013-09-03 DIAGNOSIS — H26499 Other secondary cataract, unspecified eye: Secondary | ICD-10-CM | POA: Diagnosis not present

## 2013-09-03 DIAGNOSIS — H04129 Dry eye syndrome of unspecified lacrimal gland: Secondary | ICD-10-CM | POA: Diagnosis not present

## 2013-10-08 DIAGNOSIS — Z23 Encounter for immunization: Secondary | ICD-10-CM | POA: Diagnosis not present

## 2013-12-04 DIAGNOSIS — I10 Essential (primary) hypertension: Secondary | ICD-10-CM | POA: Diagnosis not present

## 2013-12-04 DIAGNOSIS — E785 Hyperlipidemia, unspecified: Secondary | ICD-10-CM | POA: Diagnosis not present

## 2013-12-04 DIAGNOSIS — Z6828 Body mass index (BMI) 28.0-28.9, adult: Secondary | ICD-10-CM | POA: Diagnosis not present

## 2013-12-04 DIAGNOSIS — E119 Type 2 diabetes mellitus without complications: Secondary | ICD-10-CM | POA: Diagnosis not present

## 2013-12-04 DIAGNOSIS — J329 Chronic sinusitis, unspecified: Secondary | ICD-10-CM | POA: Diagnosis not present

## 2013-12-31 ENCOUNTER — Encounter: Payer: Self-pay | Admitting: Family

## 2014-01-01 ENCOUNTER — Ambulatory Visit (INDEPENDENT_AMBULATORY_CARE_PROVIDER_SITE_OTHER): Payer: Medicare Other | Admitting: Family

## 2014-01-01 ENCOUNTER — Encounter: Payer: Self-pay | Admitting: Family

## 2014-01-01 ENCOUNTER — Ambulatory Visit (HOSPITAL_COMMUNITY)
Admission: RE | Admit: 2014-01-01 | Discharge: 2014-01-01 | Disposition: A | Discharge status: Home / Self Care | Payer: Medicare Other | Source: Ambulatory Visit | Attending: Vascular Surgery | Admitting: Vascular Surgery

## 2014-01-01 VITALS — BP 158/86 | HR 57 | Resp 16 | Ht 70.5 in | Wt 191.0 lb

## 2014-01-01 DIAGNOSIS — I499 Cardiac arrhythmia, unspecified: Secondary | ICD-10-CM | POA: Insufficient documentation

## 2014-01-01 DIAGNOSIS — I6529 Occlusion and stenosis of unspecified carotid artery: Secondary | ICD-10-CM | POA: Insufficient documentation

## 2014-01-01 DIAGNOSIS — Z48812 Encounter for surgical aftercare following surgery on the circulatory system: Secondary | ICD-10-CM | POA: Insufficient documentation

## 2014-01-01 HISTORY — DX: Encounter for surgical aftercare following surgery on the circulatory system: Z48.812

## 2014-01-01 NOTE — Patient Instructions (Signed)
Stroke Prevention Some medical conditions and behaviors are associated with an increased chance of having a stroke. You may prevent a stroke by making healthy choices and managing medical conditions. HOW CAN I REDUCE MY RISK OF HAVING A STROKE?   Stay physically active. Get at least 30 minutes of activity on most or all days.  Do not smoke. It may also be helpful to avoid exposure to secondhand smoke.  Limit alcohol use. Moderate alcohol use is considered to be:  No more than 2 drinks per day for men.  No more than 1 drink per day for nonpregnant women.  Eat healthy foods. This involves  Eating 5 or more servings of fruits and vegetables a day.  Following a diet that addresses high blood pressure (hypertension), high cholesterol, diabetes, or obesity.  Manage your cholesterol levels.  A diet low in saturated fat, trans fat, and cholesterol and high in fiber may control cholesterol levels.  Take any prescribed medicines to control cholesterol as directed by your health care provider.  Manage your diabetes.  A controlled-carbohydrate, controlled-sugar diet is recommended to manage diabetes.  Take any prescribed medicines to control diabetes as directed by your health care provider.  Control your hypertension.  A low-salt (sodium), low-saturated fat, low-trans fat, and low-cholesterol diet is recommended to manage hypertension.  Take any prescribed medicines to control hypertension as directed by your health care provider.  Maintain a healthy weight.  A reduced-calorie, low-sodium, low-saturated fat, low-trans fat, low-cholesterol diet is recommended to manage weight.  Stop drug abuse.  Avoid taking birth control pills.  Talk to your health care provider about the risks of taking birth control pills if you are over 35 years old, smoke, get migraines, or have ever had a blood clot.  Get evaluated for sleep disorders (sleep apnea).  Talk to your health care provider about  getting a sleep evaluation if you snore a lot or have excessive sleepiness.  Take medicines as directed by your health care provider.  For some people, aspirin or blood thinners (anticoagulants) are helpful in reducing the risk of forming abnormal blood clots that can lead to stroke. If you have the irregular heart rhythm of atrial fibrillation, you should be on a blood thinner unless there is a good reason you cannot take them.  Understand all your medicine instructions.  Make sure that other other conditions (such as anemia or atherosclerosis) are addressed. SEEK IMMEDIATE MEDICAL CARE IF:   You have sudden weakness or numbness of the face, arm, or leg, especially on one side of the body.  Your face or eyelid droops to one side.  You have sudden confusion.  You have trouble speaking (aphasia) or understanding.  You have sudden trouble seeing in one or both eyes.  You have sudden trouble walking.  You have dizziness.  You have a loss of balance or coordination.  You have a sudden, severe headache with no known cause.  You have new chest pain or an irregular heartbeat. Any of these symptoms may represent a serious problem that is an emergency. Do not wait to see if the symptoms will go away. Get medical help at once. Call your local emergency services  (911 in [place] Do not drive yourself to the hospital. Document Released: 01/10/2005 Document Revised: 09/23/2013 Document Reviewed: 06/05/2013 ExitCare Patient Information 2014 [employer].   Smoking Cessation Quitting smoking is important to your health and has many advantages. However, it is not always easy to quit since nicotine is a   very addictive drug. Often times, people try 3 times or more before being able to quit. This document explains the best ways for you to prepare to quit smoking. Quitting takes hard work and a lot of effort, but you can do it. ADVANTAGES OF QUITTING SMOKING  You will live longer, feel better,  and live better.  Your body will feel the impact of quitting smoking almost immediately.  Within 20 minutes, blood pressure decreases. Your pulse returns to its normal level.  After 8 hours, carbon monoxide levels in the blood return to normal. Your oxygen level increases.  After 24 hours, the chance of having a heart attack starts to decrease. Your breath, hair, and body stop smelling like smoke.  After 48 hours, damaged nerve endings begin to recover. Your sense of taste and smell improve.  After 72 hours, the body is virtually free of nicotine. Your bronchial tubes relax and breathing becomes easier.  After 2 to 12 weeks, lungs can hold more air. Exercise becomes easier and circulation improves.  The risk of having a heart attack, stroke, cancer, or lung disease is greatly reduced.  After 1 year, the risk of coronary heart disease is cut in half.  After 5 years, the risk of stroke falls to the same as a nonsmoker.  After 10 years, the risk of lung cancer is cut in half and the risk of other cancers decreases significantly.  After 15 years, the risk of coronary heart disease drops, usually to the level of a nonsmoker.  If you are pregnant, quitting smoking will improve your chances of having a healthy baby.  The people you live with, especially any children, will be healthier.  You will have extra money to spend on things other than cigarettes. QUESTIONS TO THINK ABOUT BEFORE ATTEMPTING TO QUIT You may want to talk about your answers with your caregiver.  Why do you want to quit?  If you tried to quit in the past, what helped and what did not?  What will be the most difficult situations for you after you quit? How will you plan to handle them?  Who can help you through the tough times? Your family? Friends? A caregiver?  What pleasures do you get from smoking? What ways can you still get pleasure if you quit? Here are some questions to ask your caregiver:  How can you  help me to be successful at quitting?  What medicine do you think would be best for me and how should I take it?  What should I do if I need more help?  What is smoking withdrawal like? How can I get information on withdrawal? GET READY  Set a quit date.  Change your environment by getting rid of all cigarettes, ashtrays, matches, and lighters in your home, car, or work. Do not let people smoke in your home.  Review your past attempts to quit. Think about what worked and what did not. GET SUPPORT AND ENCOURAGEMENT You have a better chance of being successful if you have help. You can get support in many ways.  Tell your family, friends, and co-workers that you are going to quit and need their support. Ask them not to smoke around you.  Get individual, group, or telephone counseling and support. Programs are available at local hospitals and health centers. Call your local health department for information about programs in your area.  Spiritual beliefs and practices may help some smokers quit.  Download a "quit meter" on your computer   to keep track of quit statistics, such as how long you have gone without smoking, cigarettes not smoked, and money saved.  Get a self-help book about quitting smoking and staying off of tobacco. LEARN NEW SKILLS AND BEHAVIORS  Distract yourself from urges to smoke. Talk to someone, go for a walk, or occupy your time with a task.  Change your normal routine. Take a different route to work. Drink tea instead of coffee. Eat breakfast in a different place.  Reduce your stress. Take a hot bath, exercise, or read a book.  Plan something enjoyable to do every day. Reward yourself for not smoking.  Explore interactive web-based programs that specialize in helping you quit. GET MEDICINE AND USE IT CORRECTLY Medicines can help you stop smoking and decrease the urge to smoke. Combining medicine with the above behavioral methods and support can greatly increase  your chances of successfully quitting smoking.  Nicotine replacement therapy helps deliver nicotine to your body without the negative effects and risks of smoking. Nicotine replacement therapy includes nicotine gum, lozenges, inhalers, nasal sprays, and skin patches. Some may be available over-the-counter and others require a prescription.  Antidepressant medicine helps people abstain from smoking, but how this works is unknown. This medicine is available by prescription.  Nicotinic receptor partial agonist medicine simulates the effect of nicotine in your brain. This medicine is available by prescription. Ask your caregiver for advice about which medicines to use and how to use them based on your health history. Your caregiver will tell you what side effects to look out for if you choose to be on a medicine or therapy. Carefully read the information on the package. Do not use any other product containing nicotine while using a nicotine replacement product.  RELAPSE OR DIFFICULT SITUATIONS Most relapses occur within the first 3 months after quitting. Do not be discouraged if you start smoking again. Remember, most people try several times before finally quitting. You may have symptoms of withdrawal because your body is used to nicotine. You may crave cigarettes, be irritable, feel very hungry, cough often, get headaches, or have difficulty concentrating. The withdrawal symptoms are only temporary. They are strongest when you first quit, but they will go away within 10 14 days. To reduce the chances of relapse, try to:  Avoid drinking alcohol. Drinking lowers your chances of successfully quitting.  Reduce the amount of caffeine you consume. Once you quit smoking, the amount of caffeine in your body increases and can give you symptoms, such as a rapid heartbeat, sweating, and anxiety.  Avoid smokers because they can make you want to smoke.  Do not let weight gain distract you. Many smokers will gain  weight when they quit, usually less than 10 pounds. Eat a healthy diet and stay active. You can always lose the weight gained after you quit.  Find ways to improve your mood other than smoking. FOR MORE INFORMATION  www.smokefree.gov  Document Released: 11/27/2001 Document Revised: 06/03/2012 Document Reviewed: 03/13/2012 ExitCare Patient Information 2014 [employer].  

## 2014-01-01 NOTE — Progress Notes (Signed)
Established Carotid Patient  History of Present Illness  Joseph Hebert is a 68 y.o. male patient of Dr. [name] who is s/p left CEA on 10/31/10 and also has a history of bilateral iliofemoral stents, [place].on 08/08/1998. He returns today for carotid artery surveillance.  Patient has Negative history of TIA or stroke symptom.  The patient denies amaurosis fugax or monocular blindness.  The patient  denies facial drooping.  Pt. denies hemiplegia.  The patient denies receptive or expressive aphasia.  Pt. denies extremity weakness. Pt denies claudication symptoms.   Pt reports New Medical or Surgical History: sinusitis last month, treated by his PCP. Pt denies cardiac problems.  Pt Diabetic: Yes, diet controlled, last A1C was 6.3% per pt. Pt smoker: smoker  (1/2 ppd x 50+ yrs, decreased from 2 ppd)  Pt meds include: Statin : Yes ASA: Yes Other anticoagulants/antiplatelets: Plavix   Past Medical History  Diagnosis Date  . COPD (chronic obstructive pulmonary disease)   . Hypertension   . Hyperlipidemia   . Arrhythmia   . Carotid artery occlusion     with Claudication  . Asthma   . Vertigo   . Diabetes mellitus without complication     Type II  Diet and  Exercise    Social History History  Substance Use Topics  . Smoking status: Current Every Day Smoker -- 0.30 packs/day    Types: Cigarettes  . Smokeless tobacco: Never Used     Comment: pt stating that he trying his best to quit  . Alcohol Use: No    Family History Family History  Problem Relation Age of Onset  . Diabetes Mother   . Hyperlipidemia Father   . Hypertension Father   . Heart disease Sister     Aneyrism     Surgical History Past Surgical History  Procedure Laterality Date  . Carotid endarterectomy  Nov. 15,2011    LEFT cea  . Joint replacement  1980    RIGHT  knee  . Stents  Aug.  23, 1999    Bilateral iliofemoral  stents, [employer].    No Known Allergies  Current Outpatient Prescriptions   Medication Sig Dispense Refill  . amLODipine (NORVASC) 10 MG tablet Take 10 mg by mouth daily.      [name] aspirin 81 MG tablet Take 81 mg by mouth daily.      [name] atorvastatin (LIPITOR) 80 MG tablet Take 80 mg by mouth daily.      [name] azelastine (ASTELIN) 137 MCG/SPRAY nasal spray Place 2 sprays into both nostrils 2 (two) times daily. Use in each nostril as directed      . clopidogrel (PLAVIX) 75 MG tablet Take 75 mg by mouth daily with breakfast.      . fenofibrate (TRICOR) 145 MG tablet Take 145 mg by mouth daily.      [name] lisinopril (PRINIVIL,ZESTRIL) 10 MG tablet Take 10 mg by mouth daily.      . memantine (NAMENDA) 10 MG tablet Take 10 mg by mouth 2 (two) times daily.      . metoprolol succinate (TOPROL-XL) 50 MG 24 hr tablet Take 50 mg by mouth daily. Take with or immediately following a meal.      . Multiple Vitamin (MULTIVITAMIN) tablet Take 1 tablet by mouth daily.      [name] tiotropium (SPIRIVA) 18 MCG inhalation capsule Place 18 mcg into inhaler and inhale daily.      [name] albuterol (PROVENTIL HFA;VENTOLIN HFA) 108 (90 BASE) MCG/ACT inhaler  Inhale 2 puffs into the lungs every 6 (six) hours as needed.      [name] albuterol (PROVENTIL) (2.5 MG/3ML) 0.083% nebulizer solution Take 2.5 mg by nebulization every 6 (six) hours as needed.      . celecoxib (CELEBREX) 200 MG capsule Take 200 mg by mouth 2 (two) times daily.      . Choline Fenofibrate (TRILIPIX) 135 MG capsule Take 135 mg by mouth daily.      . cilostazol (PLETAL) 100 MG tablet Take 100 mg by mouth 2 (two) times daily.      [name] dicyclomine (BENTYL) 20 MG tablet Take 20 mg by mouth every 6 (six) hours.      [name] donepezil (ARICEPT) 10 MG tablet Take 10 mg by mouth at bedtime as needed.      . nisoldipine (SULAR) 25.5 MG 24 hr tablet Take 25.5 mg by mouth daily.      . nisoldipine (SULAR) 25.5 MG 24 hr tablet Take 25.5 mg by mouth daily.      [name] omega-3 acid ethyl esters (LOVAZA) 1 G capsule Take 1 g by mouth 2 (two) times daily.      . Omega-3 Fatty Acids  (FISH OIL) 1000 MG CAPS Take by mouth. Mega Red      . rosuvastatin (CRESTOR) 20 MG tablet Take 20 mg by mouth daily.       No current facility-administered medications for this visit.    Review of Systems : See HPI for pertinent positives and negatives.  Physical Examination  Filed Vitals:   01/01/14 1220  BP: 158/86  Pulse: 57  Resp: 16   Filed Weights   01/01/14 1220  Weight: 191 lb (86.637 kg)   Body mass index is 27.01 kg/(m^2).  General: WDWN male in NAD GAIT: normal Eyes: PERRLA Pulmonary:  CTAB, Negative  Rales, Negative rhonchi, & Negative wheezing.  Cardiac: regular Rhythm ,  Negative detected Murmurs.  VASCULAR EXAM Carotid Bruits Left Right   Negative Negative    Aorta is not palpable. Radial pulses are 3+ palpable and equal.                                                                                                                            LE Pulses LEFT RIGHT       FEMORAL   palpable   palpable        POPLITEAL  not palpable   not palpable       POSTERIOR TIBIAL   palpable   palpable        DORSALIS PEDIS      ANTERIOR TIBIAL  palpable  palpable     Gastrointestinal: soft, nontender, BS WNL, no r/g,  negative masses.  Musculoskeletal: Negative muscle atrophy/wasting. M/S 5/5 throughout, Extremities without ischemic changes.  Neurologic: A&O X 3; Appropriate Affect ; SENSATION ;normal;  Speech is normal CN 2-12 intact, Pain and light touch intact in extremities, Motor exam as listed above.  Non-Invasive Vascular Imaging CAROTID DUPLEX 01/01/2014   Right ICA: 40 - 59 % stenosis. Left ICA: patent, s/p CEA.  These findings are Unchanged from previous exam.  Assessment: Joseph Hebert is a 68 y.o. male who who is s/p left CEA on 10/31/10 and also has a history of bilateral iliofemoral stents, [place].on 08/08/1998. He presents with asymptomatic mild/moderate right ICA stenosis and patent left ICA which is a CEA site. The  ICA  stenosis is  Unchanged from previous exam. His ABI's a year ago were normal. His atherosclerotic risk factors are smoking and well controlled DM. He is working on weaning and stopping smoking.  Plan: Follow-up in 1 year with Carotid Duplex scan. Patient counseled re smoking cessation.   I discussed in depth with the patient the nature of atherosclerosis, and emphasized the importance of maximal medical management including strict control of blood pressure, blood glucose, and lipid levels, obtaining regular exercise, and cessation of smoking.  The patient is aware that without maximal medical management the underlying atherosclerotic disease process will progress, limiting the benefit of any interventions.                                                                                                                                      The patient was given information about stroke prevention and what symptoms should prompt the patient to seek immediate medical care.  Thank you for allowing [place] to participate in this patient's care.  Clemon Chambers, RN, MSN, FNP-C [facility] [place] Office: [phone]  Clinic Physician: Bridgett Larsson  01/01/2014 12:31 PM

## 2014-01-01 NOTE — Addendum Note (Signed)
Addended by: Mena Goes on: 01/01/2014 04:57 PM   Modules accepted: Orders

## 2014-03-04 DIAGNOSIS — I10 Essential (primary) hypertension: Secondary | ICD-10-CM | POA: Diagnosis not present

## 2014-03-04 DIAGNOSIS — E119 Type 2 diabetes mellitus without complications: Secondary | ICD-10-CM | POA: Diagnosis not present

## 2014-03-04 DIAGNOSIS — Z6828 Body mass index (BMI) 28.0-28.9, adult: Secondary | ICD-10-CM | POA: Diagnosis not present

## 2014-03-04 DIAGNOSIS — E785 Hyperlipidemia, unspecified: Secondary | ICD-10-CM | POA: Diagnosis not present

## 2014-03-04 DIAGNOSIS — N183 Chronic kidney disease, stage 3 unspecified: Secondary | ICD-10-CM | POA: Diagnosis not present

## 2014-03-30 DIAGNOSIS — N453 Epididymo-orchitis: Secondary | ICD-10-CM | POA: Diagnosis not present

## 2014-03-30 DIAGNOSIS — R112 Nausea with vomiting, unspecified: Secondary | ICD-10-CM | POA: Diagnosis not present

## 2014-03-30 DIAGNOSIS — Z6828 Body mass index (BMI) 28.0-28.9, adult: Secondary | ICD-10-CM | POA: Diagnosis not present

## 2014-06-04 DIAGNOSIS — Z125 Encounter for screening for malignant neoplasm of prostate: Secondary | ICD-10-CM | POA: Diagnosis not present

## 2014-06-04 DIAGNOSIS — E119 Type 2 diabetes mellitus without complications: Secondary | ICD-10-CM | POA: Diagnosis not present

## 2014-06-04 DIAGNOSIS — I10 Essential (primary) hypertension: Secondary | ICD-10-CM | POA: Diagnosis not present

## 2014-06-04 DIAGNOSIS — N183 Chronic kidney disease, stage 3 unspecified: Secondary | ICD-10-CM | POA: Diagnosis not present

## 2014-06-04 DIAGNOSIS — E785 Hyperlipidemia, unspecified: Secondary | ICD-10-CM | POA: Diagnosis not present

## 2014-06-04 DIAGNOSIS — N529 Male erectile dysfunction, unspecified: Secondary | ICD-10-CM | POA: Diagnosis not present

## 2014-06-04 DIAGNOSIS — J449 Chronic obstructive pulmonary disease, unspecified: Secondary | ICD-10-CM | POA: Diagnosis not present

## 2014-07-07 DIAGNOSIS — L820 Inflamed seborrheic keratosis: Secondary | ICD-10-CM | POA: Diagnosis not present

## 2014-07-07 DIAGNOSIS — L578 Other skin changes due to chronic exposure to nonionizing radiation: Secondary | ICD-10-CM | POA: Diagnosis not present

## 2014-07-07 DIAGNOSIS — L821 Other seborrheic keratosis: Secondary | ICD-10-CM | POA: Diagnosis not present

## 2014-09-06 DIAGNOSIS — E119 Type 2 diabetes mellitus without complications: Secondary | ICD-10-CM | POA: Diagnosis not present

## 2014-09-06 DIAGNOSIS — I10 Essential (primary) hypertension: Secondary | ICD-10-CM | POA: Diagnosis not present

## 2014-09-06 DIAGNOSIS — E785 Hyperlipidemia, unspecified: Secondary | ICD-10-CM | POA: Diagnosis not present

## 2014-09-06 DIAGNOSIS — H26499 Other secondary cataract, unspecified eye: Secondary | ICD-10-CM | POA: Diagnosis not present

## 2014-09-06 DIAGNOSIS — Z6829 Body mass index (BMI) 29.0-29.9, adult: Secondary | ICD-10-CM | POA: Diagnosis not present

## 2014-09-07 DIAGNOSIS — E119 Type 2 diabetes mellitus without complications: Secondary | ICD-10-CM | POA: Diagnosis not present

## 2014-09-07 DIAGNOSIS — L820 Inflamed seborrheic keratosis: Secondary | ICD-10-CM | POA: Diagnosis not present

## 2014-09-07 DIAGNOSIS — D1739 Benign lipomatous neoplasm of skin and subcutaneous tissue of other sites: Secondary | ICD-10-CM | POA: Diagnosis not present

## 2014-09-07 DIAGNOSIS — E785 Hyperlipidemia, unspecified: Secondary | ICD-10-CM | POA: Diagnosis not present

## 2014-09-07 DIAGNOSIS — N183 Chronic kidney disease, stage 3 unspecified: Secondary | ICD-10-CM | POA: Diagnosis not present

## 2014-09-07 DIAGNOSIS — L821 Other seborrheic keratosis: Secondary | ICD-10-CM | POA: Diagnosis not present

## 2014-09-07 DIAGNOSIS — D235 Other benign neoplasm of skin of trunk: Secondary | ICD-10-CM | POA: Diagnosis not present

## 2014-12-07 DIAGNOSIS — N183 Chronic kidney disease, stage 3 (moderate): Secondary | ICD-10-CM | POA: Diagnosis not present

## 2014-12-07 DIAGNOSIS — E785 Hyperlipidemia, unspecified: Secondary | ICD-10-CM | POA: Diagnosis not present

## 2014-12-07 DIAGNOSIS — Z1389 Encounter for screening for other disorder: Secondary | ICD-10-CM | POA: Diagnosis not present

## 2014-12-07 DIAGNOSIS — Z9181 History of falling: Secondary | ICD-10-CM | POA: Diagnosis not present

## 2014-12-07 DIAGNOSIS — I10 Essential (primary) hypertension: Secondary | ICD-10-CM | POA: Diagnosis not present

## 2014-12-07 DIAGNOSIS — E119 Type 2 diabetes mellitus without complications: Secondary | ICD-10-CM | POA: Diagnosis not present

## 2014-12-07 DIAGNOSIS — Z6828 Body mass index (BMI) 28.0-28.9, adult: Secondary | ICD-10-CM | POA: Diagnosis not present

## 2014-12-07 DIAGNOSIS — Z720 Tobacco use: Secondary | ICD-10-CM | POA: Diagnosis not present

## 2014-12-08 DIAGNOSIS — D3132 Benign neoplasm of left choroid: Secondary | ICD-10-CM | POA: Insufficient documentation

## 2014-12-08 DIAGNOSIS — H3322 Serous retinal detachment, left eye: Secondary | ICD-10-CM

## 2014-12-08 DIAGNOSIS — Z961 Presence of intraocular lens: Secondary | ICD-10-CM

## 2014-12-08 HISTORY — DX: Benign neoplasm of left choroid: D31.32

## 2014-12-08 HISTORY — DX: Serous retinal detachment, left eye: H33.22

## 2014-12-08 HISTORY — DX: Presence of intraocular lens: Z96.1

## 2015-01-03 DIAGNOSIS — Z6828 Body mass index (BMI) 28.0-28.9, adult: Secondary | ICD-10-CM | POA: Diagnosis not present

## 2015-01-03 DIAGNOSIS — M79671 Pain in right foot: Secondary | ICD-10-CM | POA: Diagnosis not present

## 2015-01-03 DIAGNOSIS — M109 Gout, unspecified: Secondary | ICD-10-CM | POA: Diagnosis not present

## 2015-01-06 ENCOUNTER — Encounter: Payer: Self-pay | Admitting: Family

## 2015-01-07 ENCOUNTER — Other Ambulatory Visit (HOSPITAL_COMMUNITY): Payer: Medicare Other

## 2015-01-07 ENCOUNTER — Ambulatory Visit: Payer: Medicare Other | Admitting: Family

## 2015-01-24 ENCOUNTER — Encounter: Payer: Self-pay | Admitting: Family

## 2015-01-26 ENCOUNTER — Ambulatory Visit (HOSPITAL_COMMUNITY)
Admission: RE | Admit: 2015-01-26 | Discharge: 2015-01-26 | Disposition: A | Discharge status: Home / Self Care | Payer: Medicare Other | Source: Ambulatory Visit | Attending: Family | Admitting: Family

## 2015-01-26 ENCOUNTER — Encounter: Payer: Self-pay | Admitting: Family

## 2015-01-26 ENCOUNTER — Ambulatory Visit (INDEPENDENT_AMBULATORY_CARE_PROVIDER_SITE_OTHER): Payer: Medicare Other | Admitting: Family

## 2015-01-26 VITALS — BP 146/84 | HR 53 | Resp 14 | Ht 70.0 in | Wt 194.3 lb

## 2015-01-26 DIAGNOSIS — Z48812 Encounter for surgical aftercare following surgery on the circulatory system: Secondary | ICD-10-CM

## 2015-01-26 DIAGNOSIS — Z720 Tobacco use: Secondary | ICD-10-CM | POA: Diagnosis not present

## 2015-01-26 DIAGNOSIS — Z9889 Other specified postprocedural states: Secondary | ICD-10-CM | POA: Diagnosis not present

## 2015-01-26 DIAGNOSIS — F17200 Nicotine dependence, unspecified, uncomplicated: Secondary | ICD-10-CM

## 2015-01-26 DIAGNOSIS — I6523 Occlusion and stenosis of bilateral carotid arteries: Secondary | ICD-10-CM | POA: Diagnosis not present

## 2015-01-26 NOTE — Patient Instructions (Addendum)
Stroke Prevention Some medical conditions and behaviors are associated with an increased chance of having a stroke. You may prevent a stroke by making healthy choices and managing medical conditions. HOW CAN I REDUCE MY RISK OF HAVING A STROKE?   Stay physically active. Get at least 30 minutes of activity on most or all days.  Do not smoke. It may also be helpful to avoid exposure to secondhand smoke.  Limit alcohol use. Moderate alcohol use is considered to be:  No more than 2 drinks per day for men.  No more than 1 drink per day for nonpregnant women.  Eat healthy foods. This involves:  Eating 5 or more servings of fruits and vegetables a day.  Making dietary changes that address high blood pressure (hypertension), high cholesterol, diabetes, or obesity.  Manage your cholesterol levels.  Making food choices that are high in fiber and low in saturated fat, trans fat, and cholesterol may control cholesterol levels.  Take any prescribed medicines to control cholesterol as directed by your health care provider.  Manage your diabetes.  Controlling your carbohydrate and sugar intake is recommended to manage diabetes.  Take any prescribed medicines to control diabetes as directed by your health care provider.  Control your hypertension.  Making food choices that are low in salt (sodium), saturated fat, trans fat, and cholesterol is recommended to manage hypertension.  Take any prescribed medicines to control hypertension as directed by your health care provider.  Maintain a healthy weight.  Reducing calorie intake and making food choices that are low in sodium, saturated fat, trans fat, and cholesterol are recommended to manage weight.  Stop drug abuse.  Avoid taking birth control pills.  Talk to your health care provider about the risks of taking birth control pills if you are over 35 years old, smoke, get migraines, or have ever had a blood clot.  Get evaluated for sleep  disorders (sleep apnea).  Talk to your health care provider about getting a sleep evaluation if you snore a lot or have excessive sleepiness.  Take medicines only as directed by your health care provider.  For some people, aspirin or blood thinners (anticoagulants) are helpful in reducing the risk of forming abnormal blood clots that can lead to stroke. If you have the irregular heart rhythm of atrial fibrillation, you should be on a blood thinner unless there is a good reason you cannot take them.  Understand all your medicine instructions.  Make sure that other conditions (such as anemia or atherosclerosis) are addressed. SEEK IMMEDIATE MEDICAL CARE IF:   You have sudden weakness or numbness of the face, arm, or leg, especially on one side of the body.  Your face or eyelid droops to one side.  You have sudden confusion.  You have trouble speaking (aphasia) or understanding.  You have sudden trouble seeing in one or both eyes.  You have sudden trouble walking.  You have dizziness.  You have a loss of balance or coordination.  You have a sudden, severe headache with no known cause.  You have new chest pain or an irregular heartbeat. Any of these symptoms may represent a serious problem that is an emergency. Do not wait to see if the symptoms will go away. Get medical help at once. Call your local emergency services (911 in [place] Do not drive yourself to the hospital. Document Released: 01/10/2005 Document Revised: 04/19/2014 Document Reviewed: 06/05/2013 ExitCare Patient Information 2015 [employer]. This information is not intended to replace advice given   to you by your health care provider. Make sure you discuss any questions you have with your health care provider.    Smoking Cessation Quitting smoking is important to your health and has many advantages. However, it is not always easy to quit since nicotine is a very addictive drug. Oftentimes, people try 3 times or  more before being able to quit. This document explains the best ways for you to prepare to quit smoking. Quitting takes hard work and a lot of effort, but you can do it. ADVANTAGES OF QUITTING SMOKING  You will live longer, feel better, and live better.  Your body will feel the impact of quitting smoking almost immediately.  Within 20 minutes, blood pressure decreases. Your pulse returns to its normal level.  After 8 hours, carbon monoxide levels in the blood return to normal. Your oxygen level increases.  After 24 hours, the chance of having a heart attack starts to decrease. Your breath, hair, and body stop smelling like smoke.  After 48 hours, damaged nerve endings begin to recover. Your sense of taste and smell improve.  After 72 hours, the body is virtually free of nicotine. Your bronchial tubes relax and breathing becomes easier.  After 2 to 12 weeks, lungs can hold more air. Exercise becomes easier and circulation improves.  The risk of having a heart attack, stroke, cancer, or lung disease is greatly reduced.  After 1 year, the risk of coronary heart disease is cut in half.  After 5 years, the risk of stroke falls to the same as a nonsmoker.  After 10 years, the risk of lung cancer is cut in half and the risk of other cancers decreases significantly.  After 15 years, the risk of coronary heart disease drops, usually to the level of a nonsmoker.  If you are pregnant, quitting smoking will improve your chances of having a healthy baby.  The people you live with, especially any children, will be healthier.  You will have extra money to spend on things other than cigarettes. QUESTIONS TO THINK ABOUT BEFORE ATTEMPTING TO QUIT You may want to talk about your answers with your health care provider.  Why do you want to quit?  If you tried to quit in the past, what helped and what did not?  What will be the most difficult situations for you after you quit? How will you plan to  handle them?  Who can help you through the tough times? Your family? Friends? A health care provider?  What pleasures do you get from smoking? What ways can you still get pleasure if you quit? Here are some questions to ask your health care provider:  How can you help me to be successful at quitting?  What medicine do you think would be best for me and how should I take it?  What should I do if I need more help?  What is smoking withdrawal like? How can I get information on withdrawal? GET READY  Set a quit date.  Change your environment by getting rid of all cigarettes, ashtrays, matches, and lighters in your home, car, or work. Do not let people smoke in your home.  Review your past attempts to quit. Think about what worked and what did not. GET SUPPORT AND ENCOURAGEMENT You have a better chance of being successful if you have help. You can get support in many ways.  Tell your family, friends, and coworkers that you are going to quit and need their support. Ask them   not to smoke around you.  Get individual, group, or telephone counseling and support. Programs are available at local hospitals and health centers. Call your local health department for information about programs in your area.  Spiritual beliefs and practices may help some smokers quit.  Download a "quit meter" on your computer to keep track of quit statistics, such as how long you have gone without smoking, cigarettes not smoked, and money saved.  Get a self-help book about quitting smoking and staying off tobacco. LEARN NEW SKILLS AND BEHAVIORS  Distract yourself from urges to smoke. Talk to someone, go for a walk, or occupy your time with a task.  Change your normal routine. Take a different route to work. Drink tea instead of coffee. Eat breakfast in a different place.  Reduce your stress. Take a hot bath, exercise, or read a book.  Plan something enjoyable to do every day. Reward yourself for not  smoking.  Explore interactive web-based programs that specialize in helping you quit. GET MEDICINE AND USE IT CORRECTLY Medicines can help you stop smoking and decrease the urge to smoke. Combining medicine with the above behavioral methods and support can greatly increase your chances of successfully quitting smoking.  Nicotine replacement therapy helps deliver nicotine to your body without the negative effects and risks of smoking. Nicotine replacement therapy includes nicotine gum, lozenges, inhalers, nasal sprays, and skin patches. Some may be available over-the-counter and others require a prescription.  Antidepressant medicine helps people abstain from smoking, but how this works is unknown. This medicine is available by prescription.  Nicotinic receptor partial agonist medicine simulates the effect of nicotine in your brain. This medicine is available by prescription. Ask your health care provider for advice about which medicines to use and how to use them based on your health history. Your health care provider will tell you what side effects to look out for if you choose to be on a medicine or therapy. Carefully read the information on the package. Do not use any other product containing nicotine while using a nicotine replacement product.  RELAPSE OR DIFFICULT SITUATIONS Most relapses occur within the first 3 months after quitting. Do not be discouraged if you start smoking again. Remember, most people try several times before finally quitting. You may have symptoms of withdrawal because your body is used to nicotine. You may crave cigarettes, be irritable, feel very hungry, cough often, get headaches, or have difficulty concentrating. The withdrawal symptoms are only temporary. They are strongest when you first quit, but they will go away within 10-14 days. To reduce the chances of relapse, try to:  Avoid drinking alcohol. Drinking lowers your chances of successfully quitting.  Reduce the  amount of caffeine you consume. Once you quit smoking, the amount of caffeine in your body increases and can give you symptoms, such as a rapid heartbeat, sweating, and anxiety.  Avoid smokers because they can make you want to smoke.  Do not let weight gain distract you. Many smokers will gain weight when they quit, usually less than 10 pounds. Eat a healthy diet and stay active. You can always lose the weight gained after you quit.  Find ways to improve your mood other than smoking. FOR MORE INFORMATION  www.smokefree.gov  Document Released: 11/27/2001 Document Revised: 04/19/2014 Document Reviewed: 03/13/2012 ExitCare Patient Information 2015 [employer]. This information is not intended to replace advice given to you by your health care provider. Make sure you discuss any questions you have with your health   care provider.    Smoking Cessation, Tips for Success If you are ready to quit smoking, congratulations! You have chosen to help yourself be healthier. Cigarettes bring nicotine, tar, carbon monoxide, and other irritants into your body. Your lungs, heart, and blood vessels will be able to work better without these poisons. There are many different ways to quit smoking. Nicotine gum, nicotine patches, a nicotine inhaler, or nicotine nasal spray can help with physical craving. Hypnosis, support groups, and medicines help break the habit of smoking. WHAT THINGS CAN I DO TO MAKE QUITTING EASIER?  Here are some tips to help you quit for good:  Pick a date when you will quit smoking completely. Tell all of your friends and family about your plan to quit on that date.  Do not try to slowly cut down on the number of cigarettes you are smoking. Pick a quit date and quit smoking completely starting on that day.  Throw away all cigarettes.   Clean and remove all ashtrays from your home, work, and car.  On a card, write down your reasons for quitting. Carry the card with you and read it  when you get the urge to smoke.  Cleanse your body of nicotine. Drink enough water and fluids to keep your urine clear or pale yellow. Do this after quitting to flush the nicotine from your body.  Learn to predict your moods. Do not let a bad situation be your excuse to have a cigarette. Some situations in your life might tempt you into wanting a cigarette.  Never have "just one" cigarette. It leads to wanting another and another. Remind yourself of your decision to quit.  Change habits associated with smoking. If you smoked while driving or when feeling stressed, try other activities to replace smoking. Stand up when drinking your coffee. Brush your teeth after eating. Sit in a different chair when you read the paper. Avoid alcohol while trying to quit, and try to drink fewer caffeinated beverages. Alcohol and caffeine may urge you to smoke.  Avoid foods and drinks that can trigger a desire to smoke, such as sugary or spicy foods and alcohol.  Ask people who smoke not to smoke around you.  Have something planned to do right after eating or having a cup of coffee. For example, plan to take a walk or exercise.  Try a relaxation exercise to calm you down and decrease your stress. Remember, you may be tense and nervous for the first 2 weeks after you quit, but this will pass.  Find new activities to keep your hands busy. Play with a pen, coin, or rubber band. Doodle or draw things on paper.  Brush your teeth right after eating. This will help cut down on the craving for the taste of tobacco after meals. You can also try mouthwash.   Use oral substitutes in place of cigarettes. Try using lemon drops, carrots, cinnamon sticks, or chewing gum. Keep them handy so they are available when you have the urge to smoke.  When you have the urge to smoke, try deep breathing.  Designate your home as a nonsmoking area.  If you are a heavy smoker, ask your health care provider about a prescription for  nicotine chewing gum. It can ease your withdrawal from nicotine.  Reward yourself. Set aside the cigarette money you save and buy yourself something nice.  Look for support from others. Join a support group or smoking cessation program. Ask someone at home or at work   to help you with your plan to quit smoking.  Always ask yourself, "Do I need this cigarette or is this just a reflex?" Tell yourself, "Today, I choose not to smoke," or "I do not want to smoke." You are reminding yourself of your decision to quit.  Do not replace cigarette smoking with electronic cigarettes (commonly called e-cigarettes). The safety of e-cigarettes is unknown, and some may contain harmful chemicals.  If you relapse, do not give up! Plan ahead and think about what you will do the next time you get the urge to smoke. HOW WILL I FEEL WHEN I QUIT SMOKING? You may have symptoms of withdrawal because your body is used to nicotine (the addictive substance in cigarettes). You may crave cigarettes, be irritable, feel very hungry, cough often, get headaches, or have difficulty concentrating. The withdrawal symptoms are only temporary. They are strongest when you first quit but will go away within 10-14 days. When withdrawal symptoms occur, stay in control. Think about your reasons for quitting. Remind yourself that these are signs that your body is healing and getting used to being without cigarettes. Remember that withdrawal symptoms are easier to treat than the major diseases that smoking can cause.  Even after the withdrawal is over, expect periodic urges to smoke. However, these cravings are generally short lived and will go away whether you smoke or not. Do not smoke! WHAT RESOURCES ARE AVAILABLE TO HELP ME QUIT SMOKING? Your health care provider can direct you to community resources or hospitals for support, which may include:  Group support.  Education.  Hypnosis.  Therapy. Document Released: 08/31/2004 Document  Revised: 04/19/2014 Document Reviewed: 05/21/2013 ExitCare Patient Information 2015 [employer]. This information is not intended to replace advice given to you by your health care provider. Make sure you discuss any questions you have with your health care provider.  

## 2015-01-26 NOTE — Progress Notes (Signed)
Established Carotid Patient   History of Present Illness  Joseph Hebert is a 69 y.o. male  patient of Dr. [name] who is s/p left CEA on 10/31/10 and also has a history of bilateral iliofemoral stents, [place].on 08/08/1998. He returns today for carotid artery surveillance.  Patient has Negative history of TIA or stroke symptom. The patient denies amaurosis fugax or monocular blindness. The patient denies facial drooping. Pt. denies hemiplegia. The patient denies receptive or expressive aphasia. Pt. denies extremity weakness. Pt reports claudication symptoms, burning in calves, after walking about 100 yards, relieved by rest in 2-3 minutes, denies non healing wounds.  Pt reports New Medical or Surgical History: diagnosed with gout in January 2016, right great toe and right ankle. Pt denies cardiac problems.  Pt Diabetic: Yes, diet controlled, last A1C was 6.3% per pt. Pt smoker: smoker (1/2 ppd x 50+ yrs, decreased from 2 ppd)  Pt meds include: Statin : Yes ASA: Yes Other anticoagulants/antiplatelets: Plavix  Past Medical History  Diagnosis Date  . COPD (chronic obstructive pulmonary disease)   . Hypertension   . Hyperlipidemia   . Arrhythmia   . Carotid artery occlusion     with Claudication  . Asthma   . Vertigo   . Diabetes mellitus without complication     Type II  Diet and  Exercise    Social History History  Substance Use Topics  . Smoking status: Current Every Day Smoker -- 0.30 packs/day    Types: Cigarettes  . Smokeless tobacco: Never Used     Comment: pt stating that he trying his best to quit  . Alcohol Use: No    Family History Family History  Problem Relation Age of Onset  . Diabetes Mother   . Hyperlipidemia Father   . Hypertension Father   . Heart disease Sister     Aneyrism     Surgical History Past Surgical History  Procedure Laterality Date  . Carotid endarterectomy  Nov. 15,2011    LEFT cea  . Joint replacement  1980    RIGHT   knee  . Stents  Aug.  23, 1999    Bilateral iliofemoral  stents, [place].    No Known Allergies  Current Outpatient Prescriptions  Medication Sig Dispense Refill  . albuterol (PROVENTIL HFA;VENTOLIN HFA) 108 (90 BASE) MCG/ACT inhaler Inhale 2 puffs into the lungs every 6 (six) hours as needed.    [name] albuterol (PROVENTIL) (2.5 MG/3ML) 0.083% nebulizer solution Take 2.5 mg by nebulization every 6 (six) hours as needed.    [name] amLODipine (NORVASC) 10 MG tablet Take 10 mg by mouth daily.    [name] aspirin 81 MG tablet Take 81 mg by mouth daily.    [name] atorvastatin (LIPITOR) 80 MG tablet Take 80 mg by mouth daily.    [name] azelastine (ASTELIN) 137 MCG/SPRAY nasal spray Place 2 sprays into both nostrils 2 (two) times daily. Use in each nostril as directed    . celecoxib (CELEBREX) 200 MG capsule Take 200 mg by mouth 2 (two) times daily.    . Choline Fenofibrate (TRILIPIX) 135 MG capsule Take 135 mg by mouth daily.    . cilostazol (PLETAL) 100 MG tablet Take 100 mg by mouth 2 (two) times daily.    . clopidogrel (PLAVIX) 75 MG tablet Take 75 mg by mouth daily with breakfast.    . dicyclomine (BENTYL) 20 MG tablet Take 20 mg by mouth every 6 (six) hours.    [name] donepezil (ARICEPT) 10  MG tablet Take 10 mg by mouth at bedtime as needed.    . fenofibrate (TRICOR) 145 MG tablet Take 145 mg by mouth daily.    [name] lisinopril (PRINIVIL,ZESTRIL) 10 MG tablet Take 10 mg by mouth daily.    . memantine (NAMENDA) 10 MG tablet Take 10 mg by mouth 2 (two) times daily.    . metoprolol succinate (TOPROL-XL) 50 MG 24 hr tablet Take 50 mg by mouth daily. Take with or immediately following a meal.    . Multiple Vitamin (MULTIVITAMIN) tablet Take 1 tablet by mouth daily.    . nisoldipine (SULAR) 25.5 MG 24 hr tablet Take 25.5 mg by mouth daily.    . nisoldipine (SULAR) 25.5 MG 24 hr tablet Take 25.5 mg by mouth daily.    [name] omega-3 acid ethyl esters (LOVAZA) 1 G capsule Take 1 g by mouth 2 (two) times daily.    . Omega-3 Fatty Acids  (FISH OIL) 1000 MG CAPS Take by mouth. Mega Red    . rosuvastatin (CRESTOR) 20 MG tablet Take 20 mg by mouth daily.    [name] tiotropium (SPIRIVA) 18 MCG inhalation capsule Place 18 mcg into inhaler and inhale daily.     No current facility-administered medications for this visit.    Review of Systems : See HPI for pertinent positives and negatives.  Physical Examination  Filed Vitals:   01/26/15 1108 01/26/15 1116  BP: 170/90 146/84  Pulse: 53 53  Resp:  14  Height:  5\' 10"  (1.778 m)  Weight:  194 lb 4.8 oz (88.134 kg)  SpO2:  98%   Body mass index is 27.88 kg/(m^2).   General: WDWN male in NAD GAIT: normal Eyes: PERRLA Pulmonary: CTAB, Negative Rales, Negative rhonchi, & Negative wheezing.  Cardiac: regular Rhythm, no detected murmurs.  VASCULAR EXAM Carotid Bruits Left Right   Negative positive   Aorta is not palpable. Radial pulses are 2+ palpable and equal.      LE Pulses LEFT RIGHT   FEMORAL  palpable  palpable    POPLITEAL not palpable  not palpable   POSTERIOR TIBIAL  not palpable  not palpable    DORSALIS PEDIS  ANTERIOR TIBIAL not palpable  Not palpable     Gastrointestinal: soft, nontender, BS WNL, no r/g, no palpable masses.  Musculoskeletal: Negative muscle atrophy/wasting. M/S 5/5 throughout, Extremities without ischemic changes.  Neurologic: A&O X 3; Appropriate Affect ; SENSATION ;normal;  Speech is normal CN 2-12 intact, Pain and light touch intact in extremities, Motor exam as listed above.        Non-Invasive Vascular Imaging CAROTID DUPLEX 01/26/2015   CEREBROVASCULAR DUPLEX EVALUATION    INDICATION: Follow-up carotid disease     PREVIOUS INTERVENTION(S): Left carotid endarterectomy 10/31/2010    DUPLEX EXAM:     RIGHT  LEFT  Peak Systolic Velocities  (cm/s) End Diastolic Velocities (cm/s) Plaque LOCATION Peak Systolic Velocities (cm/s) End Diastolic Velocities (cm/s) Plaque  129 25 HT CCA PROXIMAL 98 26 HT  126 30 HT CCA MID 103 26   161 33 HT CCA DISTAL 84 19   159 15 HT ECA 92 12   189 48 HT ICA PROXIMAL 77 14   167 44  ICA MID 101 28   100 34  ICA DISTAL 115 35     1.2 ICA / CCA Ratio (PSV) NA  Antegrade  Vertebral Flow Antegrade    Brachial Systolic Pressure (mmHg)   Within normal limits  Brachial Artery Waveforms Within normal limits  Plaque Morphology:  HM = Homogeneous, HT = Heterogeneous, CP = Calcific Plaque, SP = Smooth Plaque, IP = Irregular Plaque     ADDITIONAL FINDINGS:     IMPRESSION: 1. Evidence of 40%-59% stenosis of the right internal carotid artery. 2. Widely patent left carotid endarterectomy without evidence of restenosis or hyperplasia. 3. Bilateral vertebral artery is antegrade      Compared to the previous exam:  No significant change compared to prior exam.       Assessment: Joseph Hebert is a 69 y.o. male who is s/p left CEA on 10/31/10 and also has a history of bilateral iliofemoral stents, [place].on 08/08/1998. He presents with asymptomatic mild/moderate right ICA stenosis and a widely patent left ICA which is a CEA site. The ICA stenosis isunchanged from previous exam. His ABI's in 2014 were normal. Unfortunately he continues to smoke and indicates he does not intend to quit. See Plan.   Plan: Follow-up in 1 year with Carotid Duplex.  The patient was counseled re smoking cessation and given several free resources re smoking cessation.   I discussed in depth with the patient the nature of atherosclerosis, and emphasized the importance of maximal medical management including strict control of blood pressure, blood glucose, and lipid levels, obtaining regular exercise, and cessation of smoking.  The patient is aware that without maximal medical management the underlying atherosclerotic  disease process will progress, limiting the benefit of any interventions. The patient was given information about stroke prevention and what symptoms should prompt the patient to seek immediate medical care. Thank you for allowing [place] to participate in this patient's care.  Clemon Chambers, RN, MSN, FNP-C [facility] Office: [phone]  Clinic Physician: Scot Dock  01/26/2015 10:38 AM

## 2015-01-27 NOTE — Addendum Note (Signed)
Addended by: Mena Goes on: 01/27/2015 02:03 PM   Modules accepted: Orders

## 2015-03-14 DIAGNOSIS — K219 Gastro-esophageal reflux disease without esophagitis: Secondary | ICD-10-CM | POA: Diagnosis not present

## 2015-03-14 DIAGNOSIS — E1122 Type 2 diabetes mellitus with diabetic chronic kidney disease: Secondary | ICD-10-CM | POA: Diagnosis not present

## 2015-03-14 DIAGNOSIS — M1A0710 Idiopathic chronic gout, right ankle and foot, without tophus (tophi): Secondary | ICD-10-CM | POA: Diagnosis not present

## 2015-03-14 DIAGNOSIS — Z6828 Body mass index (BMI) 28.0-28.9, adult: Secondary | ICD-10-CM | POA: Diagnosis not present

## 2015-03-14 DIAGNOSIS — I10 Essential (primary) hypertension: Secondary | ICD-10-CM | POA: Diagnosis not present

## 2015-03-14 DIAGNOSIS — N183 Chronic kidney disease, stage 3 (moderate): Secondary | ICD-10-CM | POA: Diagnosis not present

## 2015-03-14 DIAGNOSIS — E785 Hyperlipidemia, unspecified: Secondary | ICD-10-CM | POA: Diagnosis not present

## 2015-05-17 DIAGNOSIS — Z6830 Body mass index (BMI) 30.0-30.9, adult: Secondary | ICD-10-CM | POA: Diagnosis not present

## 2015-05-17 DIAGNOSIS — I10 Essential (primary) hypertension: Secondary | ICD-10-CM | POA: Diagnosis not present

## 2015-05-26 DIAGNOSIS — Z6830 Body mass index (BMI) 30.0-30.9, adult: Secondary | ICD-10-CM | POA: Diagnosis not present

## 2015-05-26 DIAGNOSIS — Z1389 Encounter for screening for other disorder: Secondary | ICD-10-CM | POA: Diagnosis not present

## 2015-05-26 DIAGNOSIS — I10 Essential (primary) hypertension: Secondary | ICD-10-CM | POA: Diagnosis not present

## 2015-05-26 DIAGNOSIS — I739 Peripheral vascular disease, unspecified: Secondary | ICD-10-CM | POA: Diagnosis not present

## 2015-05-31 ENCOUNTER — Other Ambulatory Visit: Payer: Self-pay | Admitting: *Deleted

## 2015-05-31 DIAGNOSIS — I739 Peripheral vascular disease, unspecified: Secondary | ICD-10-CM

## 2015-06-15 ENCOUNTER — Encounter: Payer: Self-pay | Admitting: Family

## 2015-06-16 ENCOUNTER — Ambulatory Visit (INDEPENDENT_AMBULATORY_CARE_PROVIDER_SITE_OTHER): Payer: Medicare Other | Admitting: Family

## 2015-06-16 ENCOUNTER — Encounter: Payer: Self-pay | Admitting: Family

## 2015-06-16 ENCOUNTER — Other Ambulatory Visit: Payer: Self-pay

## 2015-06-16 ENCOUNTER — Other Ambulatory Visit: Payer: Self-pay | Admitting: Family

## 2015-06-16 ENCOUNTER — Ambulatory Visit (HOSPITAL_COMMUNITY)
Admission: RE | Admit: 2015-06-16 | Discharge: 2015-06-16 | Disposition: A | Discharge status: Home / Self Care | Payer: Medicare Other | Source: Ambulatory Visit | Attending: Family | Admitting: Family

## 2015-06-16 VITALS — BP 142/80 | HR 54 | Resp 14 | Ht 70.5 in | Wt 210.0 lb

## 2015-06-16 DIAGNOSIS — I70219 Atherosclerosis of native arteries of extremities with intermittent claudication, unspecified extremity: Secondary | ICD-10-CM

## 2015-06-16 DIAGNOSIS — Z48812 Encounter for surgical aftercare following surgery on the circulatory system: Secondary | ICD-10-CM

## 2015-06-16 DIAGNOSIS — Z9889 Other specified postprocedural states: Secondary | ICD-10-CM

## 2015-06-16 DIAGNOSIS — R0989 Other specified symptoms and signs involving the circulatory and respiratory systems: Secondary | ICD-10-CM | POA: Diagnosis not present

## 2015-06-16 DIAGNOSIS — I739 Peripheral vascular disease, unspecified: Secondary | ICD-10-CM

## 2015-06-16 DIAGNOSIS — M79604 Pain in right leg: Secondary | ICD-10-CM | POA: Insufficient documentation

## 2015-06-16 DIAGNOSIS — Z720 Tobacco use: Secondary | ICD-10-CM

## 2015-06-16 DIAGNOSIS — F17200 Nicotine dependence, unspecified, uncomplicated: Secondary | ICD-10-CM

## 2015-06-16 DIAGNOSIS — M79605 Pain in left leg: Secondary | ICD-10-CM

## 2015-06-16 DIAGNOSIS — Z95828 Presence of other vascular implants and grafts: Secondary | ICD-10-CM

## 2015-06-16 HISTORY — DX: Pain in right leg: M79.604

## 2015-06-16 NOTE — Patient Instructions (Signed)
Peripheral Vascular Disease Peripheral Vascular Disease (PVD), also called Peripheral Arterial Disease (PAD), is a circulation problem caused by cholesterol (atherosclerotic plaque) deposits in the arteries. PVD commonly occurs in the lower extremities (legs) but it can occur in other areas of the body, such as your arms. The cholesterol buildup in the arteries reduces blood flow which can cause pain and other serious problems. The presence of PVD can place a person at risk for Coronary Artery Disease (CAD).  CAUSES  Causes of PVD can be many. It is usually associated with more than one risk factor such as:   High Cholesterol.  Smoking.  Diabetes.  Lack of exercise or inactivity.  High blood pressure (hypertension).  Obesity.  Family history. SYMPTOMS   When the lower extremities are affected, patients with PVD may experience:  Leg pain with exertion or physical activity. This is called INTERMITTENT CLAUDICATION. This may present as cramping or numbness with physical activity. The location of the pain is associated with the level of blockage. For example, blockage at the abdominal level (distal abdominal aorta) may result in buttock or hip pain. Lower leg arterial blockage may result in calf pain.  As PVD becomes more severe, pain can develop with less physical activity.  In people with severe PVD, leg pain may occur at rest.  Other PVD signs and symptoms:  Leg numbness or weakness.  Coldness in the affected leg or foot, especially when compared to the other leg.  A change in leg color.  Patients with significant PVD are more prone to ulcers or sores on toes, feet or legs. These may take longer to heal or may reoccur. The ulcers or sores can become infected.  If signs and symptoms of PVD are ignored, gangrene may occur. This can result in the loss of toes or loss of an entire limb.  Not all leg pain is related to PVD. Other medical conditions can cause leg pain such  as:  Blood clots (embolism) or Deep Vein Thrombosis.  Inflammation of the blood vessels (vasculitis).  Spinal stenosis. DIAGNOSIS  Diagnosis of PVD can involve several different types of tests. These can include:  Pulse Volume Recording Method (PVR). This test is simple, painless and does not involve the use of X-rays. PVR involves measuring and comparing the blood pressure in the arms and legs. An ABI (Ankle-Brachial Index) is calculated. The normal ratio of blood pressures is 1. As this number becomes smaller, it indicates more severe disease.  < 0.95 - indicates significant narrowing in one or more leg vessels.  <0.8 - there will usually be pain in the foot, leg or buttock with exercise.  <0.4 - will usually have pain in the legs at rest.  <0.25 - usually indicates limb threatening PVD.  Doppler detection of pulses in the legs. This test is painless and checks to see if you have a pulses in your legs/feet.  A dye or contrast material (a substance that highlights the blood vessels so they show up on x-ray) may be given to help your caregiver better see the arteries for the following tests. The dye is eliminated from your body by the kidney's. Your caregiver may order blood work to check your kidney function and other laboratory values before the following tests are performed:  Magnetic Resonance Angiography (MRA). An MRA is a picture study of the blood vessels and arteries. The MRA machine uses a large magnet to produce images of the blood vessels.  Computed Tomography Angiography (CTA). A CTA   is a specialized x-ray that looks at how the blood flows in your blood vessels. An IV may be inserted into your arm so contrast dye can be injected.  Angiogram. Is a procedure that uses x-rays to look at your blood vessels. This procedure is minimally invasive, meaning a small incision (cut) is made in your groin. A small tube (catheter) is then inserted into the artery of your groin. The catheter  is guided to the blood vessel or artery your caregiver wants to examine. Contrast dye is injected into the catheter. X-rays are then taken of the blood vessel or artery. After the images are obtained, the catheter is taken out. TREATMENT  Treatment of PVD involves many interventions which may include:  Lifestyle changes:  Quitting smoking.  Exercise.  Following a low fat, low cholesterol diet.  Control of diabetes.  Foot care is very important to the PVD patient. Good foot care can help prevent infection.  Medication:  Cholesterol-lowering medicine.  Blood pressure medicine.  Anti-platelet drugs.  Certain medicines may reduce symptoms of Intermittent Claudication.  Interventional/Surgical options:  Angioplasty. An Angioplasty is a procedure that inflates a balloon in the blocked artery. This opens the blocked artery to improve blood flow.  Stent Implant. A wire mesh tube (stent) is placed in the artery. The stent expands and stays in place, allowing the artery to remain open.  Peripheral Bypass Surgery. This is a surgical procedure that reroutes the blood around a blocked artery to help improve blood flow. This type of procedure may be performed if Angioplasty or stent implants are not an option. SEEK IMMEDIATE MEDICAL CARE IF:   You develop pain or numbness in your arms or legs.  Your arm or leg turns cold, becomes blue in color.  You develop redness, warmth, swelling and pain in your arms or legs. MAKE SURE YOU:   Understand these instructions.  Will watch your condition.  Will get help right away if you are not doing well or get worse. Document Released: 01/10/2005 Document Revised: 02/25/2012 Document Reviewed: 12/07/2008 [employer] Patient Information 2015 [employer]. This information is not intended to replace advice given to you by your health care provider. Make sure you discuss any questions you have with your health care provider.    Smoking  Cessation Quitting smoking is important to your health and has many advantages. However, it is not always easy to quit since nicotine is a very addictive drug. Oftentimes, people try 3 times or more before being able to quit. This document explains the best ways for you to prepare to quit smoking. Quitting takes hard work and a lot of effort, but you can do it. ADVANTAGES OF QUITTING SMOKING  You will live longer, feel better, and live better.  Your body will feel the impact of quitting smoking almost immediately.  Within 20 minutes, blood pressure decreases. Your pulse returns to its normal level.  After 8 hours, carbon monoxide levels in the blood return to normal. Your oxygen level increases.  After 24 hours, the chance of having a heart attack starts to decrease. Your breath, hair, and body stop smelling like smoke.  After 48 hours, damaged nerve endings begin to recover. Your sense of taste and smell improve.  After 72 hours, the body is virtually free of nicotine. Your bronchial tubes relax and breathing becomes easier.  After 2 to 12 weeks, lungs can hold more air. Exercise becomes easier and circulation improves.  The risk of having a heart attack, stroke,   cancer, or lung disease is greatly reduced.  After 1 year, the risk of coronary heart disease is cut in half.  After 5 years, the risk of stroke falls to the same as a nonsmoker.  After 10 years, the risk of lung cancer is cut in half and the risk of other cancers decreases significantly.  After 15 years, the risk of coronary heart disease drops, usually to the level of a nonsmoker.  If you are pregnant, quitting smoking will improve your chances of having a healthy baby.  The people you live with, especially any children, will be healthier.  You will have extra money to spend on things other than cigarettes. QUESTIONS TO THINK ABOUT BEFORE ATTEMPTING TO QUIT You may want to talk about your answers with your health care  provider.  Why do you want to quit?  If you tried to quit in the past, what helped and what did not?  What will be the most difficult situations for you after you quit? How will you plan to handle them?  Who can help you through the tough times? Your family? Friends? A health care provider?  What pleasures do you get from smoking? What ways can you still get pleasure if you quit? Here are some questions to ask your health care provider:  How can you help me to be successful at quitting?  What medicine do you think would be best for me and how should I take it?  What should I do if I need more help?  What is smoking withdrawal like? How can I get information on withdrawal? GET READY  Set a quit date.  Change your environment by getting rid of all cigarettes, ashtrays, matches, and lighters in your home, car, or work. Do not let people smoke in your home.  Review your past attempts to quit. Think about what worked and what did not. GET SUPPORT AND ENCOURAGEMENT You have a better chance of being successful if you have help. You can get support in many ways.  Tell your family, friends, and coworkers that you are going to quit and need their support. Ask them not to smoke around you.  Get individual, group, or telephone counseling and support. Programs are available at local hospitals and health centers. Call your local health department for information about programs in your area.  Spiritual beliefs and practices may help some smokers quit.  Download a "quit meter" on your computer to keep track of quit statistics, such as how long you have gone without smoking, cigarettes not smoked, and money saved.  Get a self-help book about quitting smoking and staying off tobacco. LEARN NEW SKILLS AND BEHAVIORS  Distract yourself from urges to smoke. Talk to someone, go for a walk, or occupy your time with a task.  Change your normal routine. Take a different route to work. Drink tea  instead of coffee. Eat breakfast in a different place.  Reduce your stress. Take a hot bath, exercise, or read a book.  Plan something enjoyable to do every day. Reward yourself for not smoking.  Explore interactive web-based programs that specialize in helping you quit. GET MEDICINE AND USE IT CORRECTLY Medicines can help you stop smoking and decrease the urge to smoke. Combining medicine with the above behavioral methods and support can greatly increase your chances of successfully quitting smoking.  Nicotine replacement therapy helps deliver nicotine to your body without the negative effects and risks of smoking. Nicotine replacement therapy includes nicotine gum, lozenges,   inhalers, nasal sprays, and skin patches. Some may be available over-the-counter and others require a prescription.  Antidepressant medicine helps people abstain from smoking, but how this works is unknown. This medicine is available by prescription.  Nicotinic receptor partial agonist medicine simulates the effect of nicotine in your brain. This medicine is available by prescription. Ask your health care provider for advice about which medicines to use and how to use them based on your health history. Your health care provider will tell you what side effects to look out for if you choose to be on a medicine or therapy. Carefully read the information on the package. Do not use any other product containing nicotine while using a nicotine replacement product.  RELAPSE OR DIFFICULT SITUATIONS Most relapses occur within the first 3 months after quitting. Do not be discouraged if you start smoking again. Remember, most people try several times before finally quitting. You may have symptoms of withdrawal because your body is used to nicotine. You may crave cigarettes, be irritable, feel very hungry, cough often, get headaches, or have difficulty concentrating. The withdrawal symptoms are only temporary. They are strongest when you  first quit, but they will go away within 10-14 days. To reduce the chances of relapse, try to:  Avoid drinking alcohol. Drinking lowers your chances of successfully quitting.  Reduce the amount of caffeine you consume. Once you quit smoking, the amount of caffeine in your body increases and can give you symptoms, such as a rapid heartbeat, sweating, and anxiety.  Avoid smokers because they can make you want to smoke.  Do not let weight gain distract you. Many smokers will gain weight when they quit, usually less than 10 pounds. Eat a healthy diet and stay active. You can always lose the weight gained after you quit.  Find ways to improve your mood other than smoking. FOR MORE INFORMATION  www.smokefree.gov  Document Released: 11/27/2001 Document Revised: 04/19/2014 Document Reviewed: 03/13/2012 [employer] Patient Information 2015 [employer]. This information is not intended to replace advice given to you by your health care provider. Make sure you discuss any questions you have with your health care provider.    Smoking Cessation, Tips for Success If you are ready to quit smoking, congratulations! You have chosen to help yourself be healthier. Cigarettes bring nicotine, tar, carbon monoxide, and other irritants into your body. Your lungs, heart, and blood vessels will be able to work better without these poisons. There are many different ways to quit smoking. Nicotine gum, nicotine patches, a nicotine inhaler, or nicotine nasal spray can help with physical craving. Hypnosis, support groups, and medicines help break the habit of smoking. WHAT THINGS CAN I DO TO MAKE QUITTING EASIER?  Here are some tips to help you quit for good:  Pick a date when you will quit smoking completely. Tell all of your friends and family about your plan to quit on that date.  Do not try to slowly cut down on the number of cigarettes you are smoking. Pick a quit date and quit smoking completely starting on that  day.  Throw away all cigarettes.   Clean and remove all ashtrays from your home, work, and car.  On a card, write down your reasons for quitting. Carry the card with you and read it when you get the urge to smoke.  Cleanse your body of nicotine. Drink enough water and fluids to keep your urine clear or pale yellow. Do this after quitting to flush the nicotine from   your body.  Learn to predict your moods. Do not let a bad situation be your excuse to have a cigarette. Some situations in your life might tempt you into wanting a cigarette.  Never have "just one" cigarette. It leads to wanting another and another. Remind yourself of your decision to quit.  Change habits associated with smoking. If you smoked while driving or when feeling stressed, try other activities to replace smoking. Stand up when drinking your coffee. Brush your teeth after eating. Sit in a different chair when you read the paper. Avoid alcohol while trying to quit, and try to drink fewer caffeinated beverages. Alcohol and caffeine may urge you to smoke.  Avoid foods and drinks that can trigger a desire to smoke, such as sugary or spicy foods and alcohol.  Ask people who smoke not to smoke around you.  Have something planned to do right after eating or having a cup of coffee. For example, plan to take a walk or exercise.  Try a relaxation exercise to calm you down and decrease your stress. Remember, you may be tense and nervous for the first 2 weeks after you quit, but this will pass.  Find new activities to keep your hands busy. Play with a pen, coin, or rubber band. Doodle or draw things on paper.  Brush your teeth right after eating. This will help cut down on the craving for the taste of tobacco after meals. You can also try mouthwash.   Use oral substitutes in place of cigarettes. Try using lemon drops, carrots, cinnamon sticks, or chewing gum. Keep them handy so they are available when you have the urge to  smoke.  When you have the urge to smoke, try deep breathing.  Designate your home as a nonsmoking area.  If you are a heavy smoker, ask your health care provider about a prescription for nicotine chewing gum. It can ease your withdrawal from nicotine.  Reward yourself. Set aside the cigarette money you save and buy yourself something nice.  Look for support from others. Join a support group or smoking cessation program. Ask someone at home or at work to help you with your plan to quit smoking.  Always ask yourself, "Do I need this cigarette or is this just a reflex?" Tell yourself, "Today, I choose not to smoke," or "I do not want to smoke." You are reminding yourself of your decision to quit.  Do not replace cigarette smoking with electronic cigarettes (commonly called e-cigarettes). The safety of e-cigarettes is unknown, and some may contain harmful chemicals.  If you relapse, do not give up! Plan ahead and think about what you will do the next time you get the urge to smoke. HOW WILL I FEEL WHEN I QUIT SMOKING? You may have symptoms of withdrawal because your body is used to nicotine (the addictive substance in cigarettes). You may crave cigarettes, be irritable, feel very hungry, cough often, get headaches, or have difficulty concentrating. The withdrawal symptoms are only temporary. They are strongest when you first quit but will go away within 10-14 days. When withdrawal symptoms occur, stay in control. Think about your reasons for quitting. Remind yourself that these are signs that your body is healing and getting used to being without cigarettes. Remember that withdrawal symptoms are easier to treat than the major diseases that smoking can cause.  Even after the withdrawal is over, expect periodic urges to smoke. However, these cravings are generally short lived and will go away whether you   smoke or not. Do not smoke! WHAT RESOURCES ARE AVAILABLE TO HELP ME QUIT SMOKING? Your health care  provider can direct you to community resources or hospitals for support, which may include:  Group support.  Education.  Hypnosis.  Therapy. Document Released: 08/31/2004 Document Revised: 04/19/2014 Document Reviewed: 05/21/2013 [employer] Patient Information 2015 [employer]. This information is not intended to replace advice given to you by your health care provider. Make sure you discuss any questions you have with your health care provider.  

## 2015-06-16 NOTE — Progress Notes (Signed)
[facility] [place] HISTORY AND PHYSICAL -PAD  History of Present Illness Reason Joseph Hebert is a 69 y.o. male patient of Joseph Hebert who is s/p left CEA on 10/31/10 and also has a history of bilateral iliofemoral stents, Joseph Hebert.on 08/08/1998. He was last seen in our office in February 2016 for carotid artery Duplex and evaluation.  He returns today with c/o bilateral calf and thigh pain after walking about 100 yards, relieved by rest; this started gradually about March or April 2016, is not worsening.  Patient has Negative history of TIA or stroke symptom. The patient denies amaurosis fugax or monocular blindness. The patient denies facial drooping. Pt. denies hemiplegia. The patient denies receptive or expressive aphasia. Pt. denies extremity weakness.  Pt reports Joseph Medical or Surgical History: diagnosed with gout in January 2016, right great toe and right ankle. His blood pressure keeps going up, started a third antihypertensive recently.  Pt denies cardiac problems.  Pt Diabetic: Yes, diet controlled, last A1C was 6.3% per pt. Pt smoker: smoker (4-6 cigarettes/day x 50+ yrs, decreased from 2 ppd)  Pt meds include: Statin : Yes ASA: Yes Other anticoagulants/antiplatelets: Plavix     Past Medical History  Diagnosis Date  . COPD (chronic obstructive pulmonary disease)   . Hypertension   . Hyperlipidemia   . Arrhythmia   . Carotid artery occlusion     with Claudication  . Asthma   . Vertigo   . Diabetes mellitus without complication     Type II  Diet and  Exercise  . Gout Jan. 2016    Right Ankle    Social History History  Substance Use Topics  . Smoking status: Current Every Day Smoker -- 0.30 packs/day    Types: Cigarettes  . Smokeless tobacco: Never Used     Comment: pt stating that he trying his best to quit  . Alcohol Use: No    Family History Family History  Problem Relation Age of Onset  . Diabetes Mother   . Hyperlipidemia  Father   . Hypertension Father   . Heart disease Sister     Joseph Hebert     Past Surgical History  Procedure Laterality Date  . Carotid endarterectomy  Nov. 15,2011    LEFT cea  . Joint replacement  1980    RIGHT  knee  . Stents  Aug.  23, 1999    Bilateral iliofemoral  stents, [place].    No Known Allergies  Current Outpatient Prescriptions  Medication Sig Dispense Refill  . albuterol (PROVENTIL HFA;VENTOLIN HFA) 108 (90 BASE) MCG/ACT inhaler Inhale 2 puffs into the lungs every 6 (six) hours as needed.    [name] albuterol (PROVENTIL) (2.5 MG/3ML) 0.083% nebulizer solution Take 2.5 mg by nebulization every 6 (six) hours as needed.    [name] amLODipine (NORVASC) 10 MG tablet Take 10 mg by mouth daily.    [name] aspirin 81 MG tablet Take 81 mg by mouth daily.    [name] atorvastatin (LIPITOR) 80 MG tablet Take 80 mg by mouth daily.    [name] azelastine (ASTELIN) 137 MCG/SPRAY nasal spray Place 2 sprays into both nostrils as needed. Use in each nostril as directed    . celecoxib (CELEBREX) 200 MG capsule Take 200 mg by mouth 2 (two) times daily.    . Choline Fenofibrate (TRILIPIX) 135 MG capsule Take 135 mg by mouth daily.    . cilostazol (PLETAL) 100 MG tablet Take 100 mg by mouth 2 (two) times daily.    [name]  clopidogrel (PLAVIX) 75 MG tablet Take 75 mg by mouth daily with breakfast.    . dicyclomine (BENTYL) 20 MG tablet Take 20 mg by mouth every 6 (six) hours.    [name] donepezil (ARICEPT) 10 MG tablet Take 10 mg by mouth at bedtime as needed.    . fenofibrate (TRICOR) 145 MG tablet Take 145 mg by mouth daily.    [name] lisinopril (PRINIVIL,ZESTRIL) 10 MG tablet Take 20 mg by mouth daily.     . memantine (NAMENDA) 10 MG tablet Take 10 mg by mouth 2 (two) times daily.    . metoprolol succinate (TOPROL-XL) 50 MG 24 hr tablet Take 50 mg by mouth daily. Take with or immediately following a meal.    . Multiple Vitamin (MULTIVITAMIN) tablet Take 1 tablet by mouth daily.    . nisoldipine (SULAR) 25.5 MG 24 hr tablet Take 25.5  mg by mouth daily.    . nisoldipine (SULAR) 25.5 MG 24 hr tablet Take 25.5 mg by mouth daily.    [name] omega-3 acid ethyl esters (LOVAZA) 1 G capsule Take 1 g by mouth 2 (two) times daily. MegaRed/ Omega-3 Krill Oil  350 mg    . Omega-3 Fatty Acids (FISH OIL) 1000 MG CAPS Take by mouth. Mega Red    . omeprazole (PRILOSEC) 40 MG capsule Take 40 mg by mouth daily.  2  . rosuvastatin (CRESTOR) 20 MG tablet Take 20 mg by mouth daily.    [name] tiotropium (SPIRIVA) 18 MCG inhalation capsule Place 18 mcg into inhaler and inhale daily.    [name] ULORIC 40 MG tablet Take 40 mg by mouth daily.  2  . VIAGRA 100 MG tablet Take 100 mg by mouth daily as needed.  5   No current facility-administered medications for this visit.    ROS: See HPI for pertinent positives and negatives.   Physical Examination  Filed Vitals:   06/16/15 1040  BP: 142/80  Pulse: 54  Resp: 14  Height: 5' 10.5" (1.791 m)  Weight: 210 lb (95.255 kg)  SpO2: 98%   Body mass index is 29.7 kg/(m^2).   General: WDWN male in NAD GAIT: normal Eyes: PERRLA Pulmonary: CTAB, Negative Rales, Negative rhonchi, & Negative wheezing.  Cardiac: regular Rhythm, no detected murmurs.  VASCULAR EXAM Carotid Bruits Left Right   Negative positive   Aorta is not palpable. Radial pulses are 2+ palpable and equal.      LE Pulses LEFT RIGHT   FEMORAL 2+ palpable 1+ palpable    POPLITEAL not palpable  not palpable   POSTERIOR TIBIAL  not palpable   palpable    DORSALIS PEDIS  ANTERIOR TIBIAL not palpable  not palpable     Gastrointestinal: soft, nontender, BS WNL, no r/g, no palpable masses.  Musculoskeletal: Negative muscle atrophy/wasting. M/S 5/5 throughout, Extremities without ischemic changes.  Neurologic: A&O X 3; Appropriate  Affect, Speech is normal CN 2-12 intact, Pain and light touch intact in extremities, Motor exam as listed above.               Non-Invasive Vascular Imaging: DATE: 06/16/2015 ABI: RIGHT: 0.94 (1.11, 01/01/13), Waveforms: triphasic, TBI: 0.78;  LEFT: 0.67 (1.13), Waveforms: biphasic; TBI: 0.48    ASSESSMENT: Joseph Angeles is a 69 y.o. male who is s/p left CEA on 10/31/10 and also has a history of bilateral iliofemoral stents, [facility]on 08/08/1998. His ABI's in January 2014 were normal. He returns today with c/o life limiting claudication of both legs that started in  March or April this year, has not worsened. He has no signs of ischemia in his feet or legs. ABI's today: right LE remains in the normal range but has decreased, triphasic waveforms present; left LE has decreased from normal to moderate arterial occlusive disease with biphasic waveforms.  Discussed the above with Joseph Hebert. Pt was given the options of graduated walking program, addressing lifestyle measures, and continued maximal medical management or arteriogram with possible intervention; he chose to have the arteriogram.    PLAN:  The patient was counseled re smoking cessation and given several free resources re smoking cessation.  Based on the patient's vascular studies and examination, and after discussing with Joseph Hebert, pt will be scheduled for arteriogram with Joseph Hebert, possible intervention of both legs, Friday July 1.  He is scheduled to return in February 2017 for carotid artery Duplex and NP evaluation.  I discussed in depth with the patient the nature of atherosclerosis, and emphasized the importance of maximal medical management including strict control of blood pressure, blood glucose, and lipid levels, obtaining regular exercise, and cessation of smoking.  The patient is aware that without maximal medical management the underlying atherosclerotic disease process will progress, limiting the benefit  of any interventions.  The patient was given information about PAD including signs, symptoms, treatment, what symptoms should prompt the patient to seek immediate medical care, and risk reduction measures to take.  Clemon Chambers, RN, MSN, FNP-C [facility] Phone: [phone]  Clinic MD: [facility]  06/16/2015 10:09 AM

## 2015-06-17 DIAGNOSIS — Z1389 Encounter for screening for other disorder: Secondary | ICD-10-CM | POA: Diagnosis not present

## 2015-06-17 DIAGNOSIS — M109 Gout, unspecified: Secondary | ICD-10-CM | POA: Diagnosis not present

## 2015-06-17 DIAGNOSIS — N183 Chronic kidney disease, stage 3 (moderate): Secondary | ICD-10-CM | POA: Diagnosis not present

## 2015-06-17 DIAGNOSIS — E785 Hyperlipidemia, unspecified: Secondary | ICD-10-CM | POA: Diagnosis not present

## 2015-06-17 DIAGNOSIS — I10 Essential (primary) hypertension: Secondary | ICD-10-CM | POA: Diagnosis not present

## 2015-06-17 DIAGNOSIS — Z125 Encounter for screening for malignant neoplasm of prostate: Secondary | ICD-10-CM | POA: Diagnosis not present

## 2015-06-17 DIAGNOSIS — E1122 Type 2 diabetes mellitus with diabetic chronic kidney disease: Secondary | ICD-10-CM | POA: Diagnosis not present

## 2015-06-17 DIAGNOSIS — Z6830 Body mass index (BMI) 30.0-30.9, adult: Secondary | ICD-10-CM | POA: Diagnosis not present

## 2015-06-24 ENCOUNTER — Ambulatory Visit (HOSPITAL_COMMUNITY)
Admission: RE | Admit: 2015-06-24 | Discharge: 2015-06-24 | Disposition: A | Discharge status: Home / Self Care | Payer: Medicare Other | Source: Ambulatory Visit | Attending: Vascular Surgery | Admitting: Vascular Surgery

## 2015-06-24 ENCOUNTER — Other Ambulatory Visit: Payer: Self-pay | Admitting: *Deleted

## 2015-06-24 ENCOUNTER — Encounter (HOSPITAL_COMMUNITY)
Admission: RE | Disposition: A | Discharge status: Home / Self Care | Payer: Self-pay | Source: Ambulatory Visit | Attending: Vascular Surgery

## 2015-06-24 DIAGNOSIS — I70202 Unspecified atherosclerosis of native arteries of extremities, left leg: Secondary | ICD-10-CM | POA: Diagnosis present

## 2015-06-24 DIAGNOSIS — F17210 Nicotine dependence, cigarettes, uncomplicated: Secondary | ICD-10-CM | POA: Insufficient documentation

## 2015-06-24 DIAGNOSIS — Z7982 Long term (current) use of aspirin: Secondary | ICD-10-CM | POA: Insufficient documentation

## 2015-06-24 DIAGNOSIS — E119 Type 2 diabetes mellitus without complications: Secondary | ICD-10-CM | POA: Diagnosis not present

## 2015-06-24 DIAGNOSIS — Z7902 Long term (current) use of antithrombotics/antiplatelets: Secondary | ICD-10-CM | POA: Insufficient documentation

## 2015-06-24 DIAGNOSIS — I7092 Chronic total occlusion of artery of the extremities: Secondary | ICD-10-CM | POA: Diagnosis not present

## 2015-06-24 DIAGNOSIS — I10 Essential (primary) hypertension: Secondary | ICD-10-CM | POA: Insufficient documentation

## 2015-06-24 DIAGNOSIS — I70292 Other atherosclerosis of native arteries of extremities, left leg: Secondary | ICD-10-CM | POA: Insufficient documentation

## 2015-06-24 DIAGNOSIS — I739 Peripheral vascular disease, unspecified: Secondary | ICD-10-CM

## 2015-06-24 DIAGNOSIS — Z01818 Encounter for other preprocedural examination: Secondary | ICD-10-CM

## 2015-06-24 DIAGNOSIS — I70212 Atherosclerosis of native arteries of extremities with intermittent claudication, left leg: Secondary | ICD-10-CM | POA: Diagnosis not present

## 2015-06-24 HISTORY — PX: PERIPHERAL VASCULAR CATHETERIZATION: SHX172C

## 2015-06-24 LAB — POCT I-STAT, CHEM 8
BUN: 27 mg/dL — ABNORMAL HIGH (ref 6–20)
Calcium, Ion: 1.17 mmol/L (ref 1.13–1.30)
Chloride: 108 mmol/L (ref 101–111)
Creatinine, Ser: 1.8 mg/dL — ABNORMAL HIGH (ref 0.61–1.24)
Glucose, Bld: 94 mg/dL (ref 65–99)
HCT: 41 % (ref 39.0–52.0)
HEMOGLOBIN: 13.9 g/dL (ref 13.0–17.0)
POTASSIUM: 4.1 mmol/L (ref 3.5–5.1)
SODIUM: 141 mmol/L (ref 135–145)
TCO2: 23 mmol/L (ref 0–100)

## 2015-06-24 LAB — GLUCOSE, CAPILLARY: Glucose-Capillary: 97 mg/dL (ref 65–99)

## 2015-06-24 SURGERY — ABDOMINAL AORTOGRAM
Anesthesia: LOCAL

## 2015-06-24 MED ORDER — ONDANSETRON HCL 4 MG/2ML IJ SOLN: 4.0000 mg | Freq: Four times a day (QID) | INTRAMUSCULAR | Status: DC | PRN

## 2015-06-24 MED ORDER — LIDOCAINE HCL (PF) 1 % IJ SOLN
INTRAMUSCULAR | Status: AC
  Filled 2015-06-24: qty 30

## 2015-06-24 MED ORDER — ACETAMINOPHEN 325 MG RE SUPP
325.0000 mg | RECTAL | Status: DC | PRN
  Filled 2015-06-24: qty 2

## 2015-06-24 MED ORDER — HEPARIN (PORCINE) IN NACL 2-0.9 UNIT/ML-% IJ SOLN
INTRAMUSCULAR | Status: AC
  Filled 2015-06-24: qty 1000

## 2015-06-24 MED ORDER — LABETALOL HCL 5 MG/ML IV SOLN: 10.0000 mg | INTRAVENOUS | Status: DC | PRN

## 2015-06-24 MED ORDER — MORPHINE SULFATE 10 MG/ML IJ SOLN: 2.0000 mg | INTRAMUSCULAR | Status: DC | PRN

## 2015-06-24 MED ORDER — OXYCODONE HCL 5 MG PO TABS: 5.0000 mg | ORAL_TABLET | ORAL | Status: DC | PRN

## 2015-06-24 MED ORDER — HYDRALAZINE HCL 20 MG/ML IJ SOLN: 5.0000 mg | INTRAMUSCULAR | Status: DC | PRN

## 2015-06-24 MED ORDER — SODIUM CHLORIDE 0.9 % IV SOLN
500.0000 mL | Freq: Once | INTRAVENOUS | Status: DC | PRN
Start: 2015-06-24 — End: 2015-06-24

## 2015-06-24 MED ORDER — IODIXANOL 320 MG/ML IV SOLN
INTRAVENOUS | Status: DC | PRN
  Administered 2015-06-24: 126 mL via INTRAVENOUS

## 2015-06-24 MED ORDER — SODIUM CHLORIDE 0.9 % IV SOLN
INTRAVENOUS | Status: DC
  Administered 2015-06-24: 08:00:00 via INTRAVENOUS

## 2015-06-24 MED ORDER — ACETAMINOPHEN 325 MG PO TABS
325.0000 mg | ORAL_TABLET | ORAL | Status: DC | PRN
  Filled 2015-06-24: qty 2

## 2015-06-24 MED ORDER — SODIUM CHLORIDE 0.45 % IV SOLN: INTRAVENOUS | Status: DC

## 2015-06-24 MED ORDER — NITROGLYCERIN 1 MG/10 ML FOR IR/CATH LAB
INTRA_ARTERIAL | Status: DC | PRN
  Administered 2015-06-24: 11:00:00

## 2015-06-24 MED ORDER — METOPROLOL TARTRATE 1 MG/ML IV SOLN: 2.0000 mg | INTRAVENOUS | Status: DC | PRN

## 2015-06-24 SURGICAL SUPPLY — 9 items
CATH ANGIO 5F PIGTAIL 65CM (CATHETERS) ×1 IMPLANT
COVER PRB 48X5XTLSCP FOLD TPE (BAG) IMPLANT
COVER PROBE 5X48 (BAG) ×2
KIT PV (KITS) ×2 IMPLANT
SHEATH PINNACLE 5F 10CM (SHEATH) ×1 IMPLANT
SYR MEDRAD MARK V 150ML (SYRINGE) ×2 IMPLANT
TRANSDUCER W/STOPCOCK (MISCELLANEOUS) ×2 IMPLANT
TRAY PV CATH (CUSTOM PROCEDURE TRAY) ×2 IMPLANT
WIRE HITORQ VERSACORE ST 145CM (WIRE) ×1 IMPLANT

## 2015-06-24 NOTE — H&P (View-Only) (Signed)
[facility] [place] HISTORY AND PHYSICAL -PAD  History of Present Illness Joseph Hebert is a 69 y.o. male patient of Dr. [name] who is s/p left CEA on 10/31/10 and also has a history of bilateral iliofemoral stents, [place].on 08/08/1998. He was last seen in our office in February 2016 for carotid artery Duplex and evaluation.  He returns today with c/o bilateral calf and thigh pain after walking about 100 yards, relieved by rest; this started gradually about March or April 2016, is not worsening.  Patient has Negative history of TIA or stroke symptom. The patient denies amaurosis fugax or monocular blindness. The patient denies facial drooping. Pt. denies hemiplegia. The patient denies receptive or expressive aphasia. Pt. denies extremity weakness.  Pt reports New Medical or Surgical History: diagnosed with gout in January 2016, right great toe and right ankle. His blood pressure keeps going up, started a third antihypertensive recently.  Pt denies cardiac problems.  Pt Diabetic: Yes, diet controlled, last A1C was 6.3% per pt. Pt smoker: smoker (4-6 cigarettes/day x 50+ yrs, decreased from 2 ppd)  Pt meds include: Statin : Yes ASA: Yes Other anticoagulants/antiplatelets: Plavix     Past Medical History  Diagnosis Date  . COPD (chronic obstructive pulmonary disease)   . Hypertension   . Hyperlipidemia   . Arrhythmia   . Carotid artery occlusion     with Claudication  . Asthma   . Vertigo   . Diabetes mellitus without complication     Type II  Diet and  Exercise  . Gout Jan. 2016    Right Ankle    Social History History  Substance Use Topics  . Smoking status: Current Every Day Smoker -- 0.30 packs/day    Types: Cigarettes  . Smokeless tobacco: Never Used     Comment: pt stating that he trying his best to quit  . Alcohol Use: No    Family History Family History  Problem Relation Age of Onset  . Diabetes Mother   . Hyperlipidemia  Father   . Hypertension Father   . Heart disease Sister     Aneyrism     Past Surgical History  Procedure Laterality Date  . Carotid endarterectomy  Nov. 15,2011    LEFT cea  . Joint replacement  1980    RIGHT  knee  . Stents  Aug.  23, 1999    Bilateral iliofemoral  stents, [place].    No Known Allergies  Current Outpatient Prescriptions  Medication Sig Dispense Refill  . albuterol (PROVENTIL HFA;VENTOLIN HFA) 108 (90 BASE) MCG/ACT inhaler Inhale 2 puffs into the lungs every 6 (six) hours as needed.    [name] albuterol (PROVENTIL) (2.5 MG/3ML) 0.083% nebulizer solution Take 2.5 mg by nebulization every 6 (six) hours as needed.    [name] amLODipine (NORVASC) 10 MG tablet Take 10 mg by mouth daily.    [name] aspirin 81 MG tablet Take 81 mg by mouth daily.    [name] atorvastatin (LIPITOR) 80 MG tablet Take 80 mg by mouth daily.    [name] azelastine (ASTELIN) 137 MCG/SPRAY nasal spray Place 2 sprays into both nostrils as needed. Use in each nostril as directed    . celecoxib (CELEBREX) 200 MG capsule Take 200 mg by mouth 2 (two) times daily.    . Choline Fenofibrate (TRILIPIX) 135 MG capsule Take 135 mg by mouth daily.    . cilostazol (PLETAL) 100 MG tablet Take 100 mg by mouth 2 (two) times daily.    [name]  clopidogrel (PLAVIX) 75 MG tablet Take 75 mg by mouth daily with breakfast.    . dicyclomine (BENTYL) 20 MG tablet Take 20 mg by mouth every 6 (six) hours.    [name] donepezil (ARICEPT) 10 MG tablet Take 10 mg by mouth at bedtime as needed.    . fenofibrate (TRICOR) 145 MG tablet Take 145 mg by mouth daily.    [name] lisinopril (PRINIVIL,ZESTRIL) 10 MG tablet Take 20 mg by mouth daily.     . memantine (NAMENDA) 10 MG tablet Take 10 mg by mouth 2 (two) times daily.    . metoprolol succinate (TOPROL-XL) 50 MG 24 hr tablet Take 50 mg by mouth daily. Take with or immediately following a meal.    . Multiple Vitamin (MULTIVITAMIN) tablet Take 1 tablet by mouth daily.    . nisoldipine (SULAR) 25.5 MG 24 hr tablet Take 25.5  mg by mouth daily.    . nisoldipine (SULAR) 25.5 MG 24 hr tablet Take 25.5 mg by mouth daily.    [name] omega-3 acid ethyl esters (LOVAZA) 1 G capsule Take 1 g by mouth 2 (two) times daily. MegaRed/ Omega-3 Krill Oil  350 mg    . Omega-3 Fatty Acids (FISH OIL) 1000 MG CAPS Take by mouth. Mega Red    . omeprazole (PRILOSEC) 40 MG capsule Take 40 mg by mouth daily.  2  . rosuvastatin (CRESTOR) 20 MG tablet Take 20 mg by mouth daily.    [name] tiotropium (SPIRIVA) 18 MCG inhalation capsule Place 18 mcg into inhaler and inhale daily.    [name] ULORIC 40 MG tablet Take 40 mg by mouth daily.  2  . VIAGRA 100 MG tablet Take 100 mg by mouth daily as needed.  5   No current facility-administered medications for this visit.    ROS: See HPI for pertinent positives and negatives.   Physical Examination  Filed Vitals:   06/16/15 1040  BP: 142/80  Pulse: 54  Resp: 14  Height: 5' 10.5" (1.791 m)  Weight: 210 lb (95.255 kg)  SpO2: 98%   Body mass index is 29.7 kg/(m^2).   General: WDWN male in NAD GAIT: normal Eyes: PERRLA Pulmonary: CTAB, Negative Rales, Negative rhonchi, & Negative wheezing.  Cardiac: regular Rhythm, no detected murmurs.  VASCULAR EXAM Carotid Bruits Left Right   Negative positive   Aorta is not palpable. Radial pulses are 2+ palpable and equal.      LE Pulses LEFT RIGHT   FEMORAL 2+ palpable 1+ palpable    POPLITEAL not palpable  not palpable   POSTERIOR TIBIAL  not palpable   palpable    DORSALIS PEDIS  ANTERIOR TIBIAL not palpable  not palpable     Gastrointestinal: soft, nontender, BS WNL, no r/g, no palpable masses.  Musculoskeletal: Negative muscle atrophy/wasting. M/S 5/5 throughout, Extremities without ischemic changes.  Neurologic: A&O X 3; Appropriate  Affect, Speech is normal CN 2-12 intact, Pain and light touch intact in extremities, Motor exam as listed above.               Non-Invasive Vascular Imaging: DATE: 06/16/2015 ABI: RIGHT: 0.94 (1.11, 01/01/13), Waveforms: triphasic, TBI: 0.78;  LEFT: 0.67 (1.13), Waveforms: biphasic; TBI: 0.48    ASSESSMENT: [name] is a 69 y.o. male who is s/p left CEA on 10/31/10 and also has a history of bilateral iliofemoral stents, [facility]on 08/08/[facility]. His ABI's in January 2014 were normal. He returns today with c/o life limiting claudication of both legs that started in  March or April this year, has not worsened. He has no signs of ischemia in his feet or legs. ABI's today: right LE remains in the normal range but has decreased, triphasic waveforms present; left LE has decreased from normal to moderate arterial occlusive disease with biphasic waveforms.  Discussed the above with Dr. [name]. Pt was given the options of graduated walking program, addressing lifestyle measures, and continued maximal medical management or arteriogram with possible intervention; he chose to have the arteriogram.    PLAN:  The patient was counseled re smoking cessation and given several free resources re smoking cessation.  Based on the patient's vascular studies and examination, and after discussing with Dr. [name], pt will be scheduled for arteriogram with Dr. [name], possible intervention of both legs, Friday July 1.  He is scheduled to return in February 2017 for carotid artery Duplex and NP evaluation.  I discussed in depth with the patient the nature of atherosclerosis, and emphasized the importance of maximal medical management including strict control of blood pressure, blood glucose, and lipid levels, obtaining regular exercise, and cessation of smoking.  The patient is aware that without maximal medical management the underlying atherosclerotic disease process will progress, limiting the benefit  of any interventions.  The patient was given information about PAD including signs, symptoms, treatment, what symptoms should prompt the patient to seek immediate medical care, and risk reduction measures to take.  Clemon Chambers, RN, MSN, FNP-C [facility] Phone: [phone]  Clinic MD: [facility]  06/16/2015 10:09 AM

## 2015-06-24 NOTE — Progress Notes (Signed)
Pt c/o right thigh numbness. No color changes, not cool to the touch, right dp unchanged +1,  Dr [name] was notified, continue to monitor.

## 2015-06-24 NOTE — Discharge Instructions (Signed)

## 2015-06-24 NOTE — Interval H&P Note (Signed)
History and Physical Interval Note:  06/24/2015 9:16 AM  Joseph Hebert  has presented today for surgery, with the diagnosis of pad  The various methods of treatment have been discussed with the patient and family. After consideration of risks, benefits and other options for treatment, the patient has consented to  Procedure(s): Abdominal Aortogram (N/A) as a surgical intervention .  The patient's history has been reviewed, patient examined, no change in status, stable for surgery.  I have reviewed the patient's chart and labs.  Questions were answered to the patient's satisfaction.     Ruta Hinds

## 2015-06-27 ENCOUNTER — Encounter (HOSPITAL_COMMUNITY): Payer: Self-pay | Admitting: Vascular Surgery

## 2015-06-27 ENCOUNTER — Telehealth: Payer: Self-pay | Admitting: Vascular Surgery

## 2015-06-27 NOTE — Telephone Encounter (Addendum)
-----   Message from Mena Goes, RN sent at 06/24/2015  9:51 AM EDT ----- Regarding: Schedule   ----- Message -----    From: [name], MD    Sent: 06/24/2015   9:09 AM      To: [facility] Charge Pool  Aortogram with bilat runoff Right SFA stent  Pt needs follow up appt in 1 month with bilateral ABIs at that point and office visit to consider treating left leg  Ruta Hinds  notified patient of appt. with dr. Bettina Gavia on 07-18-15 10:15 and fu appt. on 08-04-15 at 11 am with dr. [name]

## 2015-07-17 DIAGNOSIS — Z01810 Encounter for preprocedural cardiovascular examination: Secondary | ICD-10-CM | POA: Insufficient documentation

## 2015-07-17 HISTORY — DX: Encounter for preprocedural cardiovascular examination: Z01.810

## 2015-07-18 DIAGNOSIS — I739 Peripheral vascular disease, unspecified: Secondary | ICD-10-CM | POA: Diagnosis present

## 2015-07-18 DIAGNOSIS — N183 Chronic kidney disease, stage 3 unspecified: Secondary | ICD-10-CM

## 2015-07-18 DIAGNOSIS — Z01810 Encounter for preprocedural cardiovascular examination: Secondary | ICD-10-CM | POA: Diagnosis not present

## 2015-07-18 DIAGNOSIS — I10 Essential (primary) hypertension: Secondary | ICD-10-CM | POA: Diagnosis not present

## 2015-07-18 HISTORY — DX: Chronic kidney disease, stage 3 unspecified: N18.30

## 2015-07-18 HISTORY — DX: Peripheral vascular disease, unspecified: I73.9

## 2015-07-21 DIAGNOSIS — I2510 Atherosclerotic heart disease of native coronary artery without angina pectoris: Secondary | ICD-10-CM | POA: Diagnosis not present

## 2015-07-21 DIAGNOSIS — Z01810 Encounter for preprocedural cardiovascular examination: Secondary | ICD-10-CM | POA: Diagnosis not present

## 2015-07-21 DIAGNOSIS — R001 Bradycardia, unspecified: Secondary | ICD-10-CM | POA: Diagnosis not present

## 2015-08-02 ENCOUNTER — Encounter: Payer: Self-pay | Admitting: Vascular Surgery

## 2015-08-03 ENCOUNTER — Encounter: Payer: Self-pay | Admitting: Vascular Surgery

## 2015-08-04 ENCOUNTER — Ambulatory Visit (HOSPITAL_COMMUNITY)
Admission: RE | Admit: 2015-08-04 | Discharge: 2015-08-04 | Disposition: A | Discharge status: Home / Self Care | Payer: Medicare Other | Source: Ambulatory Visit | Attending: Vascular Surgery | Admitting: Vascular Surgery

## 2015-08-04 ENCOUNTER — Ambulatory Visit (INDEPENDENT_AMBULATORY_CARE_PROVIDER_SITE_OTHER): Payer: Medicare Other | Admitting: Vascular Surgery

## 2015-08-04 ENCOUNTER — Encounter: Payer: Self-pay | Admitting: Vascular Surgery

## 2015-08-04 ENCOUNTER — Other Ambulatory Visit: Payer: Self-pay | Admitting: Vascular Surgery

## 2015-08-04 VITALS — BP 142/75 | HR 56 | Ht 70.5 in | Wt 220.0 lb

## 2015-08-04 DIAGNOSIS — Z48812 Encounter for surgical aftercare following surgery on the circulatory system: Secondary | ICD-10-CM | POA: Diagnosis not present

## 2015-08-04 DIAGNOSIS — E119 Type 2 diabetes mellitus without complications: Secondary | ICD-10-CM | POA: Diagnosis not present

## 2015-08-04 DIAGNOSIS — I6523 Occlusion and stenosis of bilateral carotid arteries: Secondary | ICD-10-CM

## 2015-08-04 DIAGNOSIS — E785 Hyperlipidemia, unspecified: Secondary | ICD-10-CM | POA: Insufficient documentation

## 2015-08-04 DIAGNOSIS — Z95820 Peripheral vascular angioplasty status with implants and grafts: Secondary | ICD-10-CM | POA: Diagnosis not present

## 2015-08-04 DIAGNOSIS — I10 Essential (primary) hypertension: Secondary | ICD-10-CM | POA: Diagnosis not present

## 2015-08-04 DIAGNOSIS — I739 Peripheral vascular disease, unspecified: Secondary | ICD-10-CM

## 2015-08-04 NOTE — Progress Notes (Signed)
[facility] [place] HISTORY AND PHYSICAL   History of Present Illness:  Joseph Hebert is a 69 y.o. year old male who presents for evaluation of left leg claudication. The Joseph Hebert previously had an arteriogram several weeks ago. This showed left SFA occlusion with reconstitution of the above-knee popliteal artery. He also previously had a right SFA short segment stenosis. He is here today for preoperative evaluation to consider left femoropopliteal bypass..  Other medical problems include COPD, hypertension, hyperlipidemia, diabetes, coronary artery disease, CK D3 all of which are currently stable. The Joseph Hebert recently saw Dr. Bettina Gavia and had a cardiac stress test performed. The Joseph Hebert is on Plavix and aspirin.  Past Medical History  Diagnosis Date  . COPD (chronic obstructive pulmonary disease)   . Hypertension   . Hyperlipidemia   . Arrhythmia   . Carotid artery occlusion     with Claudication  . Asthma   . Vertigo   . Diabetes mellitus without complication     Type II  Diet and  Exercise  . Gout Jan. 2016    Right Ankle  . CAD (coronary artery disease)     Past Surgical History  Procedure Laterality Date  . Carotid endarterectomy  Nov. 15,2011    LEFT cea  . Joint replacement  1980    RIGHT  knee  . Stents  Aug.  23, 1999    Bilateral iliofemoral  stents, [place].  . Peripheral vascular catheterization N/A 06/24/2015    Procedure: Abdominal Aortogram;  Surgeon: [name], MD;  Location: [facility];  Service: Cardiovascular;  Laterality: N/A;    Social History Social History  Substance Use Topics  . Smoking status: Former Smoker -- 0.30 packs/day    Types: Cigarettes    Quit date: 06/23/2015  . Smokeless tobacco: Never Used     Comment: pt stating that he trying his best to quit  . Alcohol Use: No    Family History Family History  Problem Relation Age of Onset  . Diabetes Mother   . Hyperlipidemia Father   . Hypertension Father   .  Other Father     Right Leg Amputation  . Heart disease Sister     Aneyrism     Allergies  No Known Allergies   Current Outpatient Prescriptions  Medication Sig Dispense Refill  . amLODipine (NORVASC) 10 MG tablet Take 10 mg by mouth daily.    [name] aspirin EC 81 MG tablet Take 81 mg by mouth daily.    [name] atorvastatin (LIPITOR) 80 MG tablet Take 80 mg by mouth every evening.     [name] azelastine (ASTELIN) 137 MCG/SPRAY nasal spray Place 1 spray into both nostrils 2 (two) times daily as needed. Use in each nostril as needed for allergy symptoms    . clopidogrel (PLAVIX) 75 MG tablet Take 75 mg by mouth daily with breakfast.    . fenofibrate (TRICOR) 145 MG tablet Take 145 mg by mouth daily.    . hydrALAZINE (APRESOLINE) 25 MG tablet Take 25 mg by mouth 2 (two) times daily.  0  . Krill Oil Omega-3 300 MG CAPS Take 300 mg by mouth 2 (two) times daily.    [name] lisinopril (PRINIVIL,ZESTRIL) 40 MG tablet Take 40 mg by mouth daily.    . memantine (NAMENDA XR) 28 MG CP24 24 hr capsule Take 28 mg by mouth daily.    . metoprolol succinate (TOPROL-XL) 50 MG 24 hr tablet Take 50 mg by mouth daily. Take  with or immediately following a meal.    . Multiple Vitamin (MULTIVITAMIN) tablet Take 1 tablet by mouth daily.    . pantoprazole (PROTONIX) 40 MG tablet Take 40 mg by mouth daily.  12  . tiotropium (SPIRIVA) 18 MCG inhalation capsule Place 18 mcg into inhaler and inhale daily.    [name] albuterol (PROVENTIL HFA;VENTOLIN HFA) 108 (90 BASE) MCG/ACT inhaler Inhale 2 puffs into the lungs every 6 (six) hours as needed.    [name] albuterol (PROVENTIL) (2.5 MG/3ML) 0.083% nebulizer solution Take 2.5 mg by nebulization every 6 (six) hours as needed.    [name] ULORIC 40 MG tablet Take 40 mg by mouth daily.  2  . VIAGRA 100 MG tablet Take 100 mg by mouth daily as needed.  5   No current facility-administered medications for this visit.    ROS:   General:  No weight loss, Fever, chills  HEENT: No recent headaches, no nasal  bleeding, no visual changes, no sore throat  Neurologic: No dizziness, blackouts, seizures. No recent symptoms of stroke or mini- stroke. No recent episodes of slurred speech, or temporary blindness.  Cardiac: No recent episodes of chest pain/pressure, no shortness of breath at rest.  No shortness of breath with exertion.  Denies history of atrial fibrillation or irregular heartbeat  Vascular: No history of rest pain in feet.  No history of claudication.  No history of non-healing ulcer, No history of DVT   Pulmonary: No home oxygen, no productive cough, no hemoptysis,  No asthma or wheezing  Musculoskeletal:  [ ]  Arthritis, [ ]  Low back pain,  [ ]  Joint pain  Hematologic:No history of hypercoagulable state.  No history of easy bleeding.  No history of anemia  Gastrointestinal: No hematochezia or melena,  No gastroesophageal reflux, no trouble swallowing  Urinary: [ ]  chronic Kidney disease, [ ]  on HD - [ ]  MWF or [ ]  TTHS, [ ]  Burning with urination, [ ]  Frequent urination, [ ]  Difficulty urinating;   Skin: No rashes  Psychological: No history of anxiety,  No history of depression   Physical Examination  Filed Vitals:   08/04/15 1143 08/04/15 1149  BP: 152/68 142/75  Pulse: 56   Height: 5' 10.5" (1.791 m)   Weight: 220 lb (99.791 kg)   SpO2: 98%     Body mass index is 31.11 kg/(m^2).  General:  Alert and oriented, no acute distress HEENT: Normal Neck: No bruit or JVD Pulmonary: Clear to auscultation bilaterally Cardiac: Regular Rate and Rhythm without murmur Abdomen: Soft, non-tender, non-distended, no mass, no scars Skin: No rash Extremity Pulses:  2+ radial, brachial, femoral, absent dorsalis pedis, posterior tibial pulses bilaterally Musculoskeletal: No deformity or edema  Neurologic: Upper and lower extremity motor 5/5 and symmetric  DATA:  Bilateral ABIs dated 08/04/2015 today are reviewed and interpreted. Right side was 1.03 left 0.76   ASSESSMENT:  Joseph Hebert  with left superficial femoral artery occlusion and claudication symptoms. He states that his pain starts in the left leg after walking 100 feet. He feels disabled by this. He feels it is lifestyle limiting.   PLAN:  Risks benefits possible complications and procedure details of left femoropopliteal bypass were discussed with the Joseph Hebert today. These include but are not limited to bleeding infection graft thrombosis possible limb loss. He understands and agrees to proceed. Operation will be scheduled for 08/16/2015. He will need to stop his Plavix 10 days prior to this. We will also obtain the results of his stress test from  Dr. Bettina Gavia.  Ruta Hinds, MD [facility] [place] Office: [phone] Pager: [phone]

## 2015-08-08 ENCOUNTER — Other Ambulatory Visit: Payer: Self-pay

## 2015-08-16 NOTE — Pre-Procedure Instructions (Signed)
    [name]  08/16/2015     Your procedure is scheduled on Tuesday, September 6.  Report to [facility] Admitting at 5:30 A.M.                Your surgery is scheduled for 7:30 A.M.             Call this number if you have problems the morning of surgery:[phone]                                                                                                                                                                                                     For any other questions, please call [phone], Monday - Friday 8 AM - 4 PM.   Remember:  Do not eat food or drink liquids after midnight Monday, September 5.  Take these medicines the morning of surgery with A SIP OF WATER:amLODipine (NORVASC), hydrALAZINE (APRESOLINE), metoprolol succinate (TOPROL-XL), pantoprazole (PROTONIX).              May use Inhalers and bring Albuterol Inhaler to the hospital with you.                 Stop taking Plavix and Aspirin as instructed by Dr [name].                    Stop taking Vitamins, Herbal Medication (Krill Oil) today.       Do not wear jewelry, make-up or nail polish.  Do not wear lotions, powders, or perfumes.     Men may shave face and neck.  Do not bring valuables to the hospital.  [facility] is not responsible for any belongings or valuables.  Contacts, dentures or bridgework may not be worn into surgery.  Leave your suitcase in the car.  After surgery it may be brought to your room.  For patients admitted to the hospital, discharge time will be determined by your treatment team.   Special instructions:  Review  [facility] - Preparing For Surgery.  Please read over the following fact sheets that you were given. Pain Booklet, Coughing and Deep Breathing, Blood Transfusion Information and Surgical Site Infection Prevention

## 2015-08-17 ENCOUNTER — Encounter (HOSPITAL_COMMUNITY)
Admission: RE | Admit: 2015-08-17 | Discharge: 2015-08-17 | Disposition: A | Discharge status: Home / Self Care | Payer: Medicare Other | Source: Ambulatory Visit | Attending: Vascular Surgery | Admitting: Vascular Surgery

## 2015-08-17 ENCOUNTER — Encounter (HOSPITAL_COMMUNITY): Payer: Self-pay

## 2015-08-17 DIAGNOSIS — Z79899 Other long term (current) drug therapy: Secondary | ICD-10-CM | POA: Insufficient documentation

## 2015-08-17 DIAGNOSIS — I739 Peripheral vascular disease, unspecified: Secondary | ICD-10-CM | POA: Diagnosis not present

## 2015-08-17 DIAGNOSIS — Z01812 Encounter for preprocedural laboratory examination: Secondary | ICD-10-CM | POA: Diagnosis not present

## 2015-08-17 LAB — SURGICAL PCR SCREEN
MRSA, PCR: NEGATIVE
Staphylococcus aureus: POSITIVE — AB

## 2015-08-17 LAB — PROTIME-INR
INR: 0.93 (ref 0.00–1.49)
Prothrombin Time: 12.7 seconds (ref 11.6–15.2)

## 2015-08-17 LAB — URINALYSIS, ROUTINE W REFLEX MICROSCOPIC
BILIRUBIN URINE: NEGATIVE
Glucose, UA: NEGATIVE mg/dL
HGB URINE DIPSTICK: NEGATIVE
Ketones, ur: NEGATIVE mg/dL
Leukocytes, UA: NEGATIVE
Nitrite: NEGATIVE
PH: 5.5 (ref 5.0–8.0)
Protein, ur: NEGATIVE mg/dL
SPECIFIC GRAVITY, URINE: 1.016 (ref 1.005–1.030)
UROBILINOGEN UA: 0.2 mg/dL (ref 0.0–1.0)

## 2015-08-17 LAB — COMPREHENSIVE METABOLIC PANEL
ALBUMIN: 4.3 g/dL (ref 3.5–5.0)
ALT: 26 U/L (ref 17–63)
ANION GAP: 7 (ref 5–15)
AST: 25 U/L (ref 15–41)
Alkaline Phosphatase: 66 U/L (ref 38–126)
BILIRUBIN TOTAL: 0.6 mg/dL (ref 0.3–1.2)
BUN: 23 mg/dL — ABNORMAL HIGH (ref 6–20)
CHLORIDE: 111 mmol/L (ref 101–111)
CO2: 23 mmol/L (ref 22–32)
Calcium: 10.3 mg/dL (ref 8.9–10.3)
Creatinine, Ser: 1.9 mg/dL — ABNORMAL HIGH (ref 0.61–1.24)
GFR calc Af Amer: 40 mL/min — ABNORMAL LOW (ref >60)
GFR calc non Af Amer: 34 mL/min — ABNORMAL LOW (ref >60)
GLUCOSE: 105 mg/dL — AB (ref 65–99)
POTASSIUM: 4.1 mmol/L (ref 3.5–5.1)
SODIUM: 141 mmol/L (ref 135–145)
TOTAL PROTEIN: 7.3 g/dL (ref 6.5–8.1)

## 2015-08-17 LAB — CBC
HCT: 40.9 % (ref 39.0–52.0)
Hemoglobin: 13.4 g/dL (ref 13.0–17.0)
MCH: 30.5 pg (ref 26.0–34.0)
MCHC: 32.8 g/dL (ref 30.0–36.0)
MCV: 93.2 fL (ref 78.0–100.0)
PLATELETS: 359 10*3/uL (ref 150–400)
RBC: 4.39 MIL/uL (ref 4.22–5.81)
RDW: 14.1 % (ref 11.5–15.5)
WBC: 9.8 10*3/uL (ref 4.0–10.5)

## 2015-08-17 LAB — TYPE AND SCREEN
ABO/RH(D): O POS
ANTIBODY SCREEN: NEGATIVE

## 2015-08-17 LAB — APTT: APTT: 30 s (ref 24–37)

## 2015-08-17 LAB — GLUCOSE, CAPILLARY: Glucose-Capillary: 100 mg/dL — ABNORMAL HIGH (ref 65–99)

## 2015-08-17 NOTE — Progress Notes (Signed)
Per pt last dose of aspirin and plavix taken on 08/12/2015 as he states he was instructed.

## 2015-08-17 NOTE — Progress Notes (Signed)
This patient scored at an elevated risk for obstructive sleep apnea using the STOP BANG TOOL during a pre surgical testing 

## 2015-08-17 NOTE — Pre-Procedure Instructions (Signed)
    [name]  08/17/2015      [id number] [address], [place] [address] [place] [address] Phone: [phone] Fax: [phone]    Your procedure is scheduled on August 23, 2015.  Report to [facility] Admitting at 5:30 A.M.  Call this number if you have problems the morning of surgery:  [phone]   Remember:  Do not eat food or drink liquids after midnight September 5.  Take these medicines the morning of surgery with A SIP OF WATER : metoprolol succinate (TOPROL-XL), amLODipine (NORVASC) , hydrALAZINE (APRESOLINE),   memantine (NAMENDA XR), pantoprazole (PROTONIX), tiotropium (SPIRIVA), ULORIC, Eye drops,    INHALER-BRING WITH YOU, NEBULIZER IF NEEDED   PLAVIX AND ASPIRIN AS DIRECTED   STOP HERBAL MEDICATIONS, NSAIDS (ALEVE, ADVIL, IBUPROFEN) ONE WEEK PRIOR TO SURGERY       Do not wear jewelry.  Do not wear lotions, powders, or colognes.  You may NOT wear deodorant.  Men may shave face and neck.  Do not bring valuables to the hospital.  [facility] is not responsible for any belongings or valuables.  Contacts, dentures or bridgework may not be worn into surgery.  Leave your suitcase in the car.  After surgery it may be brought to your room.  For patients admitted to the hospital, discharge time will be determined by your treatment team.    Name and phone number of your driver:    Special instructions:  "PREPARING FOR SURGERY"  Please read over the following fact sheets that you were given. Pain Booklet, Coughing and Deep Breathing, Blood Transfusion Information and Surgical Site Infection Prevention

## 2015-08-23 ENCOUNTER — Encounter (HOSPITAL_COMMUNITY)
Admission: AD | Disposition: A | Discharge status: Home / Self Care | Payer: Self-pay | Source: Ambulatory Visit | Attending: Vascular Surgery

## 2015-08-23 ENCOUNTER — Inpatient Hospital Stay (HOSPITAL_COMMUNITY): Payer: Medicare Other | Admitting: Certified Registered"

## 2015-08-23 ENCOUNTER — Inpatient Hospital Stay (HOSPITAL_COMMUNITY)
Admission: AD | Admit: 2015-08-23 | Discharge: 2015-08-26 | DRG: 253 | Disposition: A | Discharge status: Home / Self Care | Payer: Medicare Other | Source: Ambulatory Visit | Attending: Vascular Surgery | Admitting: Vascular Surgery

## 2015-08-23 ENCOUNTER — Encounter (HOSPITAL_COMMUNITY): Payer: Self-pay | Admitting: *Deleted

## 2015-08-23 DIAGNOSIS — I2510 Atherosclerotic heart disease of native coronary artery without angina pectoris: Secondary | ICD-10-CM | POA: Diagnosis present

## 2015-08-23 DIAGNOSIS — Z833 Family history of diabetes mellitus: Secondary | ICD-10-CM | POA: Diagnosis not present

## 2015-08-23 DIAGNOSIS — D62 Acute posthemorrhagic anemia: Secondary | ICD-10-CM | POA: Diagnosis not present

## 2015-08-23 DIAGNOSIS — I70212 Atherosclerosis of native arteries of extremities with intermittent claudication, left leg: Secondary | ICD-10-CM | POA: Diagnosis not present

## 2015-08-23 DIAGNOSIS — Z7982 Long term (current) use of aspirin: Secondary | ICD-10-CM | POA: Diagnosis not present

## 2015-08-23 DIAGNOSIS — F17210 Nicotine dependence, cigarettes, uncomplicated: Secondary | ICD-10-CM | POA: Diagnosis present

## 2015-08-23 DIAGNOSIS — Z96651 Presence of right artificial knee joint: Secondary | ICD-10-CM | POA: Diagnosis present

## 2015-08-23 DIAGNOSIS — Z9889 Other specified postprocedural states: Secondary | ICD-10-CM | POA: Diagnosis not present

## 2015-08-23 DIAGNOSIS — E785 Hyperlipidemia, unspecified: Secondary | ICD-10-CM | POA: Diagnosis present

## 2015-08-23 DIAGNOSIS — J449 Chronic obstructive pulmonary disease, unspecified: Secondary | ICD-10-CM | POA: Diagnosis present

## 2015-08-23 DIAGNOSIS — I748 Embolism and thrombosis of other arteries: Secondary | ICD-10-CM | POA: Diagnosis not present

## 2015-08-23 DIAGNOSIS — I10 Essential (primary) hypertension: Secondary | ICD-10-CM | POA: Diagnosis present

## 2015-08-23 DIAGNOSIS — Z87891 Personal history of nicotine dependence: Secondary | ICD-10-CM

## 2015-08-23 DIAGNOSIS — K219 Gastro-esophageal reflux disease without esophagitis: Secondary | ICD-10-CM | POA: Diagnosis present

## 2015-08-23 DIAGNOSIS — Z8249 Family history of ischemic heart disease and other diseases of the circulatory system: Secondary | ICD-10-CM

## 2015-08-23 DIAGNOSIS — I739 Peripheral vascular disease, unspecified: Secondary | ICD-10-CM | POA: Diagnosis present

## 2015-08-23 DIAGNOSIS — J45909 Unspecified asthma, uncomplicated: Secondary | ICD-10-CM | POA: Diagnosis present

## 2015-08-23 DIAGNOSIS — Z6831 Body mass index (BMI) 31.0-31.9, adult: Secondary | ICD-10-CM

## 2015-08-23 DIAGNOSIS — Z7902 Long term (current) use of antithrombotics/antiplatelets: Secondary | ICD-10-CM

## 2015-08-23 DIAGNOSIS — Z79899 Other long term (current) drug therapy: Secondary | ICD-10-CM

## 2015-08-23 DIAGNOSIS — E1151 Type 2 diabetes mellitus with diabetic peripheral angiopathy without gangrene: Secondary | ICD-10-CM | POA: Diagnosis present

## 2015-08-23 DIAGNOSIS — I771 Stricture of artery: Secondary | ICD-10-CM | POA: Diagnosis not present

## 2015-08-23 HISTORY — PX: FEMORAL-POPLITEAL BYPASS GRAFT: SHX937

## 2015-08-23 LAB — GLUCOSE, CAPILLARY
GLUCOSE-CAPILLARY: 104 mg/dL — AB (ref 65–99)
Glucose-Capillary: 122 mg/dL — ABNORMAL HIGH (ref 65–99)
Glucose-Capillary: 133 mg/dL — ABNORMAL HIGH (ref 65–99)

## 2015-08-23 SURGERY — BYPASS GRAFT FEMORAL-POPLITEAL ARTERY
Anesthesia: General | Site: Leg Upper | Laterality: Left

## 2015-08-23 MED ORDER — PROPOFOL 10 MG/ML IV BOLUS
INTRAVENOUS | Status: AC
  Filled 2015-08-23: qty 20

## 2015-08-23 MED ORDER — SODIUM CHLORIDE 0.9 % IV SOLN: 500.0000 mL | Freq: Once | INTRAVENOUS | Status: DC | PRN

## 2015-08-23 MED ORDER — ONDANSETRON HCL 4 MG/2ML IJ SOLN
INTRAMUSCULAR | Status: DC | PRN
Start: 2015-08-23 — End: 2015-08-23
  Administered 2015-08-23: 8 mg via INTRAVENOUS

## 2015-08-23 MED ORDER — ROCURONIUM BROMIDE 100 MG/10ML IV SOLN
INTRAVENOUS | Status: DC | PRN
  Administered 2015-08-23: 50 mg via INTRAVENOUS

## 2015-08-23 MED ORDER — METOPROLOL TARTRATE 1 MG/ML IV SOLN
2.0000 mg | INTRAVENOUS | Status: DC | PRN
Start: 2015-08-23 — End: 2015-08-26

## 2015-08-23 MED ORDER — WHITE PETROLATUM GEL
Status: AC
  Administered 2015-08-23: 0.2
  Filled 2015-08-23: qty 1

## 2015-08-23 MED ORDER — FENOFIBRATE 160 MG PO TABS
160.0000 mg | ORAL_TABLET | Freq: Every day | ORAL | Status: DC
  Administered 2015-08-24 – 2015-08-26 (×3): 160 mg via ORAL
  Filled 2015-08-23 (×5): qty 1

## 2015-08-23 MED ORDER — PHENOL 1.4 % MT LIQD
1.0000 | OROMUCOSAL | Status: DC | PRN
  Administered 2015-08-23: 1 via OROMUCOSAL
  Filled 2015-08-23: qty 177

## 2015-08-23 MED ORDER — MEPERIDINE HCL 25 MG/ML IJ SOLN: 6.2500 mg | INTRAMUSCULAR | Status: DC | PRN

## 2015-08-23 MED ORDER — GLYCOPYRROLATE 0.2 MG/ML IJ SOLN
INTRAMUSCULAR | Status: DC | PRN
  Administered 2015-08-23: 0.2 mg via INTRAVENOUS

## 2015-08-23 MED ORDER — FENTANYL CITRATE (PF) 100 MCG/2ML IJ SOLN
INTRAMUSCULAR | Status: DC | PRN
  Administered 2015-08-23: 250 ug via INTRAVENOUS

## 2015-08-23 MED ORDER — SODIUM CHLORIDE 0.9 % IR SOLN
Status: DC | PRN
  Administered 2015-08-23: 500 mL

## 2015-08-23 MED ORDER — THROMBIN 20000 UNITS EX SOLR
CUTANEOUS | Status: AC
  Filled 2015-08-23: qty 20000

## 2015-08-23 MED ORDER — SODIUM CHLORIDE 0.45 % IV SOLN: INTRAVENOUS | Status: DC

## 2015-08-23 MED ORDER — POLYVINYL ALCOHOL-POVIDONE 1.4-0.6 % OP SOLN: 1.0000 [drp] | Freq: Four times a day (QID) | OPHTHALMIC | Status: DC

## 2015-08-23 MED ORDER — ENOXAPARIN SODIUM 30 MG/0.3ML ~~LOC~~ SOLN
30.0000 mg | SUBCUTANEOUS | Status: DC
  Administered 2015-08-24 – 2015-08-26 (×3): 30 mg via SUBCUTANEOUS
  Filled 2015-08-23 (×5): qty 0.3

## 2015-08-23 MED ORDER — CHLORHEXIDINE GLUCONATE CLOTH 2 % EX PADS: 6.0000 | MEDICATED_PAD | Freq: Once | CUTANEOUS | Status: DC

## 2015-08-23 MED ORDER — HYDROMORPHONE HCL 1 MG/ML IJ SOLN
0.2500 mg | INTRAMUSCULAR | Status: DC | PRN
  Administered 2015-08-23 (×2): 0.5 mg via INTRAVENOUS

## 2015-08-23 MED ORDER — ROCURONIUM BROMIDE 100 MG/10ML IV SOLN: INTRAVENOUS | Status: DC | PRN

## 2015-08-23 MED ORDER — EPHEDRINE SULFATE 50 MG/ML IJ SOLN
INTRAMUSCULAR | Status: DC | PRN
  Administered 2015-08-23: 15 mg via INTRAVENOUS

## 2015-08-23 MED ORDER — AMLODIPINE BESYLATE 10 MG PO TABS
10.0000 mg | ORAL_TABLET | Freq: Every day | ORAL | Status: DC
  Administered 2015-08-24 – 2015-08-26 (×3): 10 mg via ORAL
  Filled 2015-08-23 (×5): qty 1

## 2015-08-23 MED ORDER — HYDROMORPHONE HCL 1 MG/ML IJ SOLN
INTRAMUSCULAR | Status: AC
  Filled 2015-08-23: qty 1

## 2015-08-23 MED ORDER — LACTATED RINGERS IV SOLN
INTRAVENOUS | Status: DC | PRN
  Administered 2015-08-23: 07:00:00 via INTRAVENOUS

## 2015-08-23 MED ORDER — ACETAMINOPHEN 325 MG PO TABS: 325.0000 mg | ORAL_TABLET | ORAL | Status: DC | PRN

## 2015-08-23 MED ORDER — VANCOMYCIN HCL IN DEXTROSE 1-5 GM/200ML-% IV SOLN
INTRAVENOUS | Status: AC
  Administered 2015-08-23: 1000 mg via INTRAVENOUS
  Filled 2015-08-23: qty 200

## 2015-08-23 MED ORDER — 0.9 % SODIUM CHLORIDE (POUR BTL) OPTIME
TOPICAL | Status: DC | PRN
  Administered 2015-08-23: 2000 mL

## 2015-08-23 MED ORDER — SUCCINYLCHOLINE CHLORIDE 200 MG/10ML IV SOSY
PREFILLED_SYRINGE | INTRAVENOUS | Status: DC | PRN
  Administered 2015-08-23: 80 mg via INTRAVENOUS

## 2015-08-23 MED ORDER — ATORVASTATIN CALCIUM 80 MG PO TABS
80.0000 mg | ORAL_TABLET | Freq: Every evening | ORAL | Status: DC
  Administered 2015-08-23 – 2015-08-25 (×3): 80 mg via ORAL
  Filled 2015-08-23 (×6): qty 1

## 2015-08-23 MED ORDER — DOCUSATE SODIUM 100 MG PO CAPS
100.0000 mg | ORAL_CAPSULE | Freq: Every day | ORAL | Status: DC
  Administered 2015-08-24 – 2015-08-26 (×3): 100 mg via ORAL
  Filled 2015-08-23 (×5): qty 1

## 2015-08-23 MED ORDER — HYDROMORPHONE HCL 1 MG/ML IJ SOLN
INTRAMUSCULAR | Status: DC | PRN
  Administered 2015-08-23 (×5): .4 mg via INTRAVENOUS

## 2015-08-23 MED ORDER — SUGAMMADEX SODIUM 500 MG/5ML IV SOLN
INTRAVENOUS | Status: DC | PRN
  Administered 2015-08-23: 200 mg via INTRAVENOUS

## 2015-08-23 MED ORDER — HYDRALAZINE HCL 20 MG/ML IJ SOLN: 5.0000 mg | INTRAMUSCULAR | Status: DC | PRN

## 2015-08-23 MED ORDER — TIOTROPIUM BROMIDE MONOHYDRATE 18 MCG IN CAPS: 18.0000 ug | ORAL_CAPSULE | Freq: Every day | RESPIRATORY_TRACT | Status: DC

## 2015-08-23 MED ORDER — PAPAVERINE HCL 30 MG/ML IJ SOLN
INTRAMUSCULAR | Status: AC
  Filled 2015-08-23: qty 2

## 2015-08-23 MED ORDER — ONDANSETRON HCL 4 MG/2ML IJ SOLN: 4.0000 mg | Freq: Four times a day (QID) | INTRAMUSCULAR | Status: DC | PRN

## 2015-08-23 MED ORDER — LABETALOL HCL 5 MG/ML IV SOLN: 10.0000 mg | INTRAVENOUS | Status: DC | PRN

## 2015-08-23 MED ORDER — SUGAMMADEX SODIUM 200 MG/2ML IV SOLN
INTRAVENOUS | Status: AC
  Filled 2015-08-23: qty 2

## 2015-08-23 MED ORDER — MORPHINE SULFATE (PF) 2 MG/ML IV SOLN
2.0000 mg | INTRAVENOUS | Status: DC | PRN
  Administered 2015-08-23 – 2015-08-24 (×2): 2 mg via INTRAVENOUS
  Administered 2015-08-24 – 2015-08-25 (×2): 4 mg via INTRAVENOUS
  Filled 2015-08-23: qty 2
  Filled 2015-08-23 (×2): qty 1
  Filled 2015-08-23: qty 2

## 2015-08-23 MED ORDER — FEBUXOSTAT 40 MG PO TABS
40.0000 mg | ORAL_TABLET | Freq: Every day | ORAL | Status: DC
  Administered 2015-08-24 – 2015-08-26 (×3): 40 mg via ORAL
  Filled 2015-08-23 (×5): qty 1

## 2015-08-23 MED ORDER — ASPIRIN EC 81 MG PO TBEC
81.0000 mg | DELAYED_RELEASE_TABLET | Freq: Every day | ORAL | Status: DC
  Administered 2015-08-23 – 2015-08-26 (×4): 81 mg via ORAL
  Filled 2015-08-23 (×6): qty 1

## 2015-08-23 MED ORDER — DEXTROSE 5 % IV SOLN
INTRAVENOUS | Status: AC
  Administered 2015-08-23: 1.5 g via INTRAVENOUS
  Filled 2015-08-23: qty 1.5

## 2015-08-23 MED ORDER — DEXTROSE 5 % IV SOLN: 1.5000 g | INTRAVENOUS | Status: DC

## 2015-08-23 MED ORDER — ACETAMINOPHEN 650 MG RE SUPP: 325.0000 mg | RECTAL | Status: DC | PRN

## 2015-08-23 MED ORDER — PROPOFOL 10 MG/ML IV BOLUS
INTRAVENOUS | Status: DC | PRN
  Administered 2015-08-23: 50 mg via INTRAVENOUS
  Administered 2015-08-23: 150 mg via INTRAVENOUS

## 2015-08-23 MED ORDER — THROMBIN 20000 UNITS EX SOLR
CUTANEOUS | Status: DC | PRN
  Administered 2015-08-23: 20 mL via TOPICAL

## 2015-08-23 MED ORDER — PANTOPRAZOLE SODIUM 40 MG PO TBEC
40.0000 mg | DELAYED_RELEASE_TABLET | Freq: Every day | ORAL | Status: DC
  Administered 2015-08-24 – 2015-08-26 (×3): 40 mg via ORAL
  Filled 2015-08-23 (×3): qty 1

## 2015-08-23 MED ORDER — POTASSIUM CHLORIDE CRYS ER 20 MEQ PO TBCR
20.0000 meq | EXTENDED_RELEASE_TABLET | Freq: Every day | ORAL | Status: DC | PRN
Start: 2015-08-23 — End: 2015-08-26

## 2015-08-23 MED ORDER — DEXAMETHASONE SODIUM PHOSPHATE 4 MG/ML IJ SOLN
INTRAMUSCULAR | Status: DC | PRN
  Administered 2015-08-23: 4 mg via INTRAVENOUS

## 2015-08-23 MED ORDER — MIDAZOLAM HCL 2 MG/2ML IJ SOLN: 0.5000 mg | Freq: Once | INTRAMUSCULAR | Status: DC | PRN

## 2015-08-23 MED ORDER — MAGNESIUM SULFATE 2 GM/50ML IV SOLN
2.0000 g | Freq: Every day | INTRAVENOUS | Status: DC | PRN
  Filled 2015-08-23: qty 50

## 2015-08-23 MED ORDER — POLYVINYL ALCOHOL 1.4 % OP SOLN
1.0000 [drp] | Freq: Four times a day (QID) | OPHTHALMIC | Status: DC
  Administered 2015-08-24 – 2015-08-25 (×4): 1 [drp] via OPHTHALMIC
  Filled 2015-08-23 (×3): qty 15

## 2015-08-23 MED ORDER — OXYCODONE-ACETAMINOPHEN 5-325 MG PO TABS
1.0000 | ORAL_TABLET | ORAL | Status: DC | PRN
  Administered 2015-08-24: 1 via ORAL
  Administered 2015-08-24 – 2015-08-25 (×2): 2 via ORAL
  Filled 2015-08-23 (×2): qty 2
  Filled 2015-08-23: qty 1

## 2015-08-23 MED ORDER — VANCOMYCIN HCL IN DEXTROSE 1-5 GM/200ML-% IV SOLN: 1000.0000 mg | Freq: Two times a day (BID) | INTRAVENOUS | Status: DC

## 2015-08-23 MED ORDER — LIDOCAINE HCL (CARDIAC) 20 MG/ML IV SOLN: INTRAVENOUS | Status: DC | PRN

## 2015-08-23 MED ORDER — MEMANTINE HCL ER 28 MG PO CP24
28.0000 mg | ORAL_CAPSULE | Freq: Every day | ORAL | Status: DC
  Administered 2015-08-24 – 2015-08-26 (×3): 28 mg via ORAL
  Filled 2015-08-23 (×3): qty 1

## 2015-08-23 MED ORDER — PROMETHAZINE HCL 25 MG/ML IJ SOLN: 6.2500 mg | INTRAMUSCULAR | Status: DC | PRN

## 2015-08-23 MED ORDER — METOPROLOL SUCCINATE ER 50 MG PO TB24
50.0000 mg | ORAL_TABLET | Freq: Every day | ORAL | Status: DC
  Administered 2015-08-25 – 2015-08-26 (×2): 50 mg via ORAL
  Filled 2015-08-23 (×5): qty 1

## 2015-08-23 MED ORDER — HEPARIN SODIUM (PORCINE) 1000 UNIT/ML IJ SOLN
INTRAMUSCULAR | Status: DC | PRN
  Administered 2015-08-23: 8000 [IU] via INTRAVENOUS

## 2015-08-23 MED ORDER — SODIUM CHLORIDE 0.9 % IV SOLN: INTRAVENOUS | Status: DC

## 2015-08-23 MED ORDER — ALBUTEROL SULFATE HFA 108 (90 BASE) MCG/ACT IN AERS: 2.0000 | INHALATION_SPRAY | Freq: Four times a day (QID) | RESPIRATORY_TRACT | Status: DC | PRN

## 2015-08-23 MED ORDER — LIDOCAINE HCL (CARDIAC) 20 MG/ML IV SOLN
INTRAVENOUS | Status: DC | PRN
  Administered 2015-08-23: 80 mg via INTRAVENOUS

## 2015-08-23 MED ORDER — FENTANYL CITRATE (PF) 250 MCG/5ML IJ SOLN
INTRAMUSCULAR | Status: AC
  Filled 2015-08-23: qty 5

## 2015-08-23 MED ORDER — ALUM & MAG HYDROXIDE-SIMETH 200-200-20 MG/5ML PO SUSP
15.0000 mL | ORAL | Status: DC | PRN
  Administered 2015-08-24 (×2): 30 mL via ORAL
  Filled 2015-08-23 (×2): qty 30

## 2015-08-23 MED ORDER — VANCOMYCIN HCL 10 G IV SOLR
1500.0000 mg | Freq: Once | INTRAVENOUS | Status: AC
  Administered 2015-08-24: 1500 mg via INTRAVENOUS
  Filled 2015-08-23: qty 1500

## 2015-08-23 MED ORDER — HYDRALAZINE HCL 25 MG PO TABS
25.0000 mg | ORAL_TABLET | Freq: Two times a day (BID) | ORAL | Status: DC
  Administered 2015-08-23 – 2015-08-26 (×6): 25 mg via ORAL
  Filled 2015-08-23 (×10): qty 1

## 2015-08-23 MED ORDER — LISINOPRIL 40 MG PO TABS
40.0000 mg | ORAL_TABLET | Freq: Every day | ORAL | Status: DC
  Administered 2015-08-24 – 2015-08-25 (×2): 40 mg via ORAL
  Filled 2015-08-23 (×3): qty 1

## 2015-08-23 MED ORDER — GUAIFENESIN-DM 100-10 MG/5ML PO SYRP: 15.0000 mL | ORAL_SOLUTION | ORAL | Status: DC | PRN

## 2015-08-23 MED ORDER — CLOPIDOGREL BISULFATE 75 MG PO TABS
75.0000 mg | ORAL_TABLET | Freq: Every day | ORAL | Status: DC
  Administered 2015-08-24 – 2015-08-26 (×3): 75 mg via ORAL
  Filled 2015-08-23 (×5): qty 1

## 2015-08-23 MED FILL — Cefuroxime Sodium For Inj 1.5 GM: INTRAMUSCULAR | Qty: 1.5 | Status: AC

## 2015-08-23 SURGICAL SUPPLY — 55 items
BANDAGE ESMARK 6X9 LF (GAUZE/BANDAGES/DRESSINGS) IMPLANT
BNDG CMPR 9X6 STRL LF SNTH (GAUZE/BANDAGES/DRESSINGS)
BNDG ESMARK 6X9 LF (GAUZE/BANDAGES/DRESSINGS)
CANISTER SUCTION 2500CC (MISCELLANEOUS) ×2 IMPLANT
CANNULA VESSEL 3MM 2 BLNT TIP (CANNULA) ×3 IMPLANT
CLIP TI MEDIUM 24 (CLIP) ×2 IMPLANT
CLIP TI WIDE RED SMALL 24 (CLIP) ×2 IMPLANT
CUFF TOURNIQUET SINGLE 24IN (TOURNIQUET CUFF) IMPLANT
CUFF TOURNIQUET SINGLE 34IN LL (TOURNIQUET CUFF) IMPLANT
CUFF TOURNIQUET SINGLE 44IN (TOURNIQUET CUFF) IMPLANT
DRAIN SNY WOU (WOUND CARE) IMPLANT
DRAPE PROXIMA HALF (DRAPES) ×1 IMPLANT
DRAPE X-RAY CASS 24X20 (DRAPES) IMPLANT
DRSG COVADERM 4X14 (GAUZE/BANDAGES/DRESSINGS) ×1 IMPLANT
DRSG COVADERM 4X6 (GAUZE/BANDAGES/DRESSINGS) ×2 IMPLANT
ELECT REM PT RETURN 9FT ADLT (ELECTROSURGICAL) ×2
ELECTRODE REM PT RTRN 9FT ADLT (ELECTROSURGICAL) ×1 IMPLANT
EVACUATOR SILICONE 100CC (DRAIN) IMPLANT
GLOVE BIO SURGEON STRL SZ7.5 (GLOVE) ×2 IMPLANT
GLOVE BIOGEL PI IND STRL 6.5 (GLOVE) ×2 IMPLANT
GLOVE BIOGEL PI IND STRL 7.0 (GLOVE) IMPLANT
GLOVE BIOGEL PI INDICATOR 6.5 (GLOVE) ×2
GLOVE BIOGEL PI INDICATOR 7.0 (GLOVE) ×2
GLOVE ECLIPSE 6.5 STRL STRAW (GLOVE) ×1 IMPLANT
GOWN STRL REUS W/ TWL LRG LVL3 (GOWN DISPOSABLE) ×3 IMPLANT
GOWN STRL REUS W/TWL LRG LVL3 (GOWN DISPOSABLE) ×6
GRAFT PROPATEN W/RING 6X80X60 (Vascular Products) ×2 IMPLANT
KIT BASIN OR (CUSTOM PROCEDURE TRAY) ×2 IMPLANT
KIT ROOM TURNOVER OR (KITS) ×2 IMPLANT
LIQUID BAND (GAUZE/BANDAGES/DRESSINGS) ×2 IMPLANT
NS IRRIG 1000ML POUR BTL (IV SOLUTION) ×4 IMPLANT
PACK PERIPHERAL VASCULAR (CUSTOM PROCEDURE TRAY) ×2 IMPLANT
PAD ARMBOARD 7.5X6 YLW CONV (MISCELLANEOUS) ×4 IMPLANT
PADDING CAST COTTON 6X4 STRL (CAST SUPPLIES) IMPLANT
SET COLLECT BLD 21X3/4 12 (NEEDLE) IMPLANT
SPONGE SURGIFOAM ABS GEL 100 (HEMOSTASIS) IMPLANT
STAPLER VISISTAT 35W (STAPLE) IMPLANT
STOPCOCK 4 WAY LG BORE MALE ST (IV SETS) IMPLANT
SUT PROLENE 5 0 C 1 24 (SUTURE) ×2 IMPLANT
SUT PROLENE 6 0 CC (SUTURE) ×3 IMPLANT
SUT PROLENE 7 0 BV 1 (SUTURE) IMPLANT
SUT PROLENE 7 0 BV1 MDA (SUTURE) IMPLANT
SUT SILK 2 0 SH (SUTURE) ×2 IMPLANT
SUT SILK 3 0 (SUTURE) ×4
SUT SILK 3-0 18XBRD TIE 12 (SUTURE) IMPLANT
SUT VIC AB 2-0 CTX 36 (SUTURE) ×4 IMPLANT
SUT VIC AB 3-0 SH 27 (SUTURE) ×10
SUT VIC AB 3-0 SH 27X BRD (SUTURE) ×2 IMPLANT
SUT VIC AB 4-0 PS2 27 (SUTURE) ×4 IMPLANT
SUT VICRYL 4-0 PS2 18IN ABS (SUTURE) ×2 IMPLANT
TAPE UMBILICAL COTTON 1/8X30 (MISCELLANEOUS) IMPLANT
TRAY FOLEY W/METER SILVER 16FR (SET/KITS/TRAYS/PACK) ×2 IMPLANT
TUBING EXTENTION W/L.L. (IV SETS) IMPLANT
UNDERPAD 30X30 INCONTINENT (UNDERPADS AND DIAPERS) ×2 IMPLANT
WATER STERILE IRR 1000ML POUR (IV SOLUTION) ×2 IMPLANT

## 2015-08-23 NOTE — Anesthesia Preprocedure Evaluation (Signed)
Anesthesia Evaluation  Patient identified by MRN, date of birth, ID band Patient awake    Reviewed: Allergy & Precautions, NPO status , Patient's Chart, lab work & pertinent test results, reviewed documented beta blocker date and time   History of Anesthesia Complications Negative for: history of anesthetic complications  Airway Mallampati: I  TM Distance: >3 FB Neck ROM: Full    Dental  (+) Dental Advisory Given   Pulmonary asthma , COPD COPD inhaler, former smoker (Quit 7/16),  breath sounds clear to auscultation        Cardiovascular hypertension, Pt. on medications and Pt. on home beta blockers - angina+ CAD and + Peripheral Vascular Disease Rhythm:Regular Rate:Normal  8/16 Stress test: EF 63%, normal perfusion, no ischemia   Neuro/Psych negative neurological ROS     GI/Hepatic Neg liver ROS, GERD-  Medicated and Controlled,  Endo/Other  diabetes (diet controlled, glu 104)Morbid obesity  Renal/GU Renal InsufficiencyRenal disease (creat 1.90)     Musculoskeletal   Abdominal (+) + obese,   Peds  Hematology  (+) Blood dyscrasia (plavix), ,   Anesthesia Other Findings   Reproductive/Obstetrics                             Anesthesia Physical Anesthesia Plan  ASA: III  Anesthesia Plan: General   Post-op Pain Management:    Induction: Intravenous  Airway Management Planned: Oral ETT  Additional Equipment:   Intra-op Plan:   Post-operative Plan: Extubation in OR  Informed Consent: I have reviewed the patients History and Physical, chart, labs and discussed the procedure including the risks, benefits and alternatives for the proposed anesthesia with the patient or authorized representative who has indicated his/her understanding and acceptance.   Dental advisory given  Plan Discussed with: CRNA and Surgeon  Anesthesia Plan Comments: (Plan routine monitors, GETA)         Anesthesia Quick Evaluation

## 2015-08-23 NOTE — Op Note (Signed)
Procedure: Left femoral to above-knee popliteal bypass, Propaten Preoperative diagnosis: Claudication Postoperative diagnosis: Same  Anesthesia: General  Asst.: Silva Bandy, PA-C  Operative findings: #1 Poor quality saphenous vein #2 6 mm ring supported propaten common femoral to above knee popliteal artery  Operative details: After obtaining informed consent, the patient was taken to the operating room. The patient was placed in supine position on the operating room table. After induction of general anesthesia and endotracheal intubation, a Foley catheter was placed. Next, the patient's entire left lower extremity was prepped and draped in the usual sterile fashion. A longitudinal incision was then made in the left groin and carried down through the subcutaneous tissues to expose the left common femoral artery. The common femoral artery was dissected free circumferentially. There was a pulse within the common femoral artery. The distal external iliac artery was dissected free circumferentially underneath the inguinal ligament. A vessel loop was also placed around the distal external iliac artery. Dissection was then carried out the level of the femoral bifurcation. The superficial femoral and profunda femoris arteries were dissected free circumferentially and vessel loops placed around these.  Next the right greater saphenous vein was harvested through several skip incisions in the medial right leg. The vein was of good quality approximately 3 mm and uniform diameter initially but it then had 3 branches in the mid thigh and was too small to be usable.  Next, a longitudinal incision was made on the medial aspect of the left leg above the knee. The incision was deepened down to the level of the fascia. The fascia was opened and the sartorius muscle reflected posteriorly. The above-knee popliteal space was entered. Dissection was carried down to the level of the above-knee popliteal artery this was dissected  free circumferentially.Several centimeters of the popliteal artery was dissected free to make a reasonable area for grafting of the above-knee popliteal distal anastomosis.  A subsartorial tunnel was created connecting the above-knee incision to the groin. The patient was then given 8000 units of intravenous heparin. The greater saphenous vein was ligated distally and transected. The saphenofemoral junction was then ligated with a 2 0 silk tie. The vein was set aside in case it was needed for a patch.  After appropriate circulation time of the heparin, the distal left external iliac artery was controlled with a vessel loop. The profunda femoris and superficial femoral arteries were controlled with Vesseloops. A longitudinal opening was made in the common femoral artery just above the femoral bifurcation. A 6 mm ring supported Propaten graft was beveled and sewn end of graft to side of artery using a running 5-0 Prolene suture.  The graft was then brought through the subsartorial tunnel down to the above-knee popliteal artery. The above-knee popliteal artery was controlled proximally with a Henley clamp and distally with a small Henley clamp. A longitudinal opening was made in the distal above-knee popliteal artery in an area that was fairly free of calcification. The graft was then cut to length and spatulated and signed end of graft to side of artery using running 6-0 Prolene suture. At completion of the anastomosis everything was forebled backbled and thoroughly flushed. The remainder of the anastomosis was completed and all clamps were removed restoring pulsatile flow to the above-knee popliteal artery.   The patient had biphasic Doppler flow in the dorsalis pedis and posterior tibial area of the foot. This augmented approximately 75% with unclamping the graft.  After hemostasis was obtained, the deep layers and subcutaneous layers of  the above-knee popliteal incision were closed with running 3-0 Vicryl  suture. The skin was closed with a 4-0 Vicryl subcuticular stitch. The groin was inspected and found to be hemostatic. This was then closed in multiple layers of running 2 0 and 3-0 Vicryl suture and 4-0 subcuticular stitch. The patient tolerated the procedure well and there were no complications. Instrument sponge and needle counts correct in the case. Patient was taken to the recovery in stable condition.   Ruta Hinds, MD  [facility] [place]  Office: [phone]  Pager: [phone]

## 2015-08-23 NOTE — Progress Notes (Signed)
Pharmacy note: vancomycin  69 yo male s/p bypass graft to begin vancomycin for surgical prophylaxis. Vancomycin 1000mg  IV q12 hr has been ordered.  -SCr= 1.9 on 8/31, CrCl ~ 40-45 -Vancomycin 1000mg  IV given at 7:30am today  Plan -Will adjust vancomycin to 1500mg  IV x1 (next dose due at 7:30am) -Will sign off for now. Please contact pharmacy with any other needs  Thank you, Hildred Laser, Pharm D 08/23/2015 6:00 PM

## 2015-08-23 NOTE — Progress Notes (Signed)
[facility] [name]  Subjective  - Doing well.   Objective 131/64 63 97.5 F (36.4 C) () 14 94%  Intake/Output Summary (Last 24 hours) at 08/23/15 1307 Last data filed at 08/23/15 1100  Gross per 24 hour  Intake   2200 ml  Output    450 ml  Net   1750 ml    Palpable DP/PT left foot Dressings area clean and dry Activity range of motion of toes and ankle intact  Assessment/Planning: Post op  1 Poor quality saphenous vein #2 6 mm ring supported propaten common femoral to above knee popliteal artery Disposition stable   Bailey Faiella MAUREEN 08/23/2015 1:07 PM --  Laboratory Lab Results: No results for input(s): WBC, HGB, HCT, PLT in the last 72 hours. BMET No results for input(s): NA, K, CL, CO2, GLUCOSE, BUN, CREATININE, CALCIUM in the last 72 hours.  COAG Lab Results  Component Value Date   INR 0.93 08/17/2015   INR 0.92 10/26/2010   No results found for: PTT

## 2015-08-23 NOTE — Anesthesia Procedure Notes (Signed)
Procedure Name: Intubation Date/Time: 08/23/2015 7:43 AM Performed by: Duke Salvia Pre-anesthesia Checklist: Patient identified, Emergency Drugs available, Suction available and Patient being monitored Patient Re-evaluated:Patient Re-evaluated prior to inductionOxygen Delivery Method: Circle system utilized Preoxygenation: Pre-oxygenation with 100% oxygen Intubation Type: IV induction Ventilation: Mask ventilation without difficulty Laryngoscope Size: Mac and 4 Grade View: Grade III Tube type: Oral Tube size: 7.5 mm Number of attempts: 2 Airway Equipment and Method: Rigid stylet Placement Confirmation: ETT inserted through vocal cords under direct vision,  breath sounds checked- equal and bilateral and positive ETCO2 Secured at: 22 cm Tube secured with: Tape Dental Injury: Injury to lip  Difficulty Due To: Difficulty was unanticipated, Difficult Airway- due to reduced neck mobility, Difficult Airway- due to anterior larynx and Difficult Airway- due to limited oral opening Future Recommendations: Recommend- induction with short-acting agent, and alternative techniques readily available Comments: Would recommend starting with a indirect laryngoscopy such as glidescope

## 2015-08-23 NOTE — Transfer of Care (Signed)
Immediate Anesthesia Transfer of Care Note  Patient: Joseph Hebert.  Procedure(s) Performed: Procedure(s): LEFT FEMORAL-POPLITEAL ARTERY BYPASS WITH GORETEX GRAFT (Left)  Patient Location: PACU  Anesthesia Type:General  Level of Consciousness: awake, oriented, sedated and patient cooperative  Airway & Oxygen Therapy: Patient Spontanous Breathing and Patient connected to face mask oxygen  Post-op Assessment: Report given to RN and Post -op Vital signs reviewed and stable  Post vital signs: Reviewed  Last Vitals:  Filed Vitals:   08/23/15 0550  BP: 145/69  Temp: 36.6 C  Resp: 20    Complications: No apparent anesthesia complications

## 2015-08-23 NOTE — H&P (View-Only) (Signed)
[facility] [place] HISTORY AND PHYSICAL   History of Present Illness:  Patient is a 69 y.o. year old male who presents for evaluation of left leg claudication. The patient previously had an arteriogram several weeks ago. This showed left SFA occlusion with reconstitution of the above-knee popliteal artery. He also previously had a right SFA short segment stenosis. He is here today for preoperative evaluation to consider left femoropopliteal bypass..  Other medical problems include COPD, hypertension, hyperlipidemia, diabetes, coronary artery disease, CK D3 all of which are currently stable. The patient recently saw Dr. Bettina Gavia and had a cardiac stress test performed. The patient is on Plavix and aspirin.  Past Medical History  Diagnosis Date  . COPD (chronic obstructive pulmonary disease)   . Hypertension   . Hyperlipidemia   . Arrhythmia   . Carotid artery occlusion     with Claudication  . Asthma   . Vertigo   . Diabetes mellitus without complication     Type II  Diet and  Exercise  . Gout Jan. 2016    Right Ankle  . CAD (coronary artery disease)     Past Surgical History  Procedure Laterality Date  . Carotid endarterectomy  Nov. 15,2011    LEFT cea  . Joint replacement  1980    RIGHT  knee  . Stents  Aug.  23, 1999    Bilateral iliofemoral  stents, [place].  . Peripheral vascular catheterization N/A 06/24/2015    Procedure: Abdominal Aortogram;  Surgeon: [name], MD;  Location: [facility];  Service: Cardiovascular;  Laterality: N/A;    Social History Social History  Substance Use Topics  . Smoking status: Former Smoker -- 0.30 packs/day    Types: Cigarettes    Quit date: 06/23/2015  . Smokeless tobacco: Never Used     Comment: pt stating that he trying his best to quit  . Alcohol Use: No    Family History Family History  Problem Relation Age of Onset  . Diabetes Mother   . Hyperlipidemia Father   . Hypertension Father   .  Other Father     Right Leg Amputation  . Heart disease Sister     Aneyrism     Allergies  No Known Allergies   Current Outpatient Prescriptions  Medication Sig Dispense Refill  . amLODipine (NORVASC) 10 MG tablet Take 10 mg by mouth daily.    [name] aspirin EC 81 MG tablet Take 81 mg by mouth daily.    [name] atorvastatin (LIPITOR) 80 MG tablet Take 80 mg by mouth every evening.     [name] azelastine (ASTELIN) 137 MCG/SPRAY nasal spray Place 1 spray into both nostrils 2 (two) times daily as needed. Use in each nostril as needed for allergy symptoms    . clopidogrel (PLAVIX) 75 MG tablet Take 75 mg by mouth daily with breakfast.    . fenofibrate (TRICOR) 145 MG tablet Take 145 mg by mouth daily.    . hydrALAZINE (APRESOLINE) 25 MG tablet Take 25 mg by mouth 2 (two) times daily.  0  . Krill Oil Omega-3 300 MG CAPS Take 300 mg by mouth 2 (two) times daily.    [name] lisinopril (PRINIVIL,ZESTRIL) 40 MG tablet Take 40 mg by mouth daily.    . memantine (NAMENDA XR) 28 MG CP24 24 hr capsule Take 28 mg by mouth daily.    . metoprolol succinate (TOPROL-XL) 50 MG 24 hr tablet Take 50 mg by mouth daily. Take  with or immediately following a meal.    . Multiple Vitamin (MULTIVITAMIN) tablet Take 1 tablet by mouth daily.    . pantoprazole (PROTONIX) 40 MG tablet Take 40 mg by mouth daily.  12  . tiotropium (SPIRIVA) 18 MCG inhalation capsule Place 18 mcg into inhaler and inhale daily.    [name] albuterol (PROVENTIL HFA;VENTOLIN HFA) 108 (90 BASE) MCG/ACT inhaler Inhale 2 puffs into the lungs every 6 (six) hours as needed.    [name] albuterol (PROVENTIL) (2.5 MG/3ML) 0.083% nebulizer solution Take 2.5 mg by nebulization every 6 (six) hours as needed.    [name] ULORIC 40 MG tablet Take 40 mg by mouth daily.  2  . VIAGRA 100 MG tablet Take 100 mg by mouth daily as needed.  5   No current facility-administered medications for this visit.    ROS:   General:  No weight loss, Fever, chills  HEENT: No recent headaches, no nasal  bleeding, no visual changes, no sore throat  Neurologic: No dizziness, blackouts, seizures. No recent symptoms of stroke or mini- stroke. No recent episodes of slurred speech, or temporary blindness.  Cardiac: No recent episodes of chest pain/pressure, no shortness of breath at rest.  No shortness of breath with exertion.  Denies history of atrial fibrillation or irregular heartbeat  Vascular: No history of rest pain in feet.  No history of claudication.  No history of non-healing ulcer, No history of DVT   Pulmonary: No home oxygen, no productive cough, no hemoptysis,  No asthma or wheezing  Musculoskeletal:  [ ]  Arthritis, [ ]  Low back pain,  [ ]  Joint pain  Hematologic:No history of hypercoagulable state.  No history of easy bleeding.  No history of anemia  Gastrointestinal: No hematochezia or melena,  No gastroesophageal reflux, no trouble swallowing  Urinary: [ ]  chronic Kidney disease, [ ]  on HD - [ ]  MWF or [ ]  TTHS, [ ]  Burning with urination, [ ]  Frequent urination, [ ]  Difficulty urinating;   Skin: No rashes  Psychological: No history of anxiety,  No history of depression   Physical Examination  Filed Vitals:   08/04/15 1143 08/04/15 1149  BP: 152/68 142/75  Pulse: 56   Height: 5' 10.5" (1.791 m)   Weight: 220 lb (99.791 kg)   SpO2: 98%     Body mass index is 31.11 kg/(m^2).  General:  Alert and oriented, no acute distress HEENT: Normal Neck: No bruit or JVD Pulmonary: Clear to auscultation bilaterally Cardiac: Regular Rate and Rhythm without murmur Abdomen: Soft, non-tender, non-distended, no mass, no scars Skin: No rash Extremity Pulses:  2+ radial, brachial, femoral, absent dorsalis pedis, posterior tibial pulses bilaterally Musculoskeletal: No deformity or edema  Neurologic: Upper and lower extremity motor 5/5 and symmetric  DATA:  Bilateral ABIs dated 08/04/2015 today are reviewed and interpreted. Right side was 1.03 left 0.76   ASSESSMENT:  Patient  with left superficial femoral artery occlusion and claudication symptoms. He states that his pain starts in the left leg after walking 100 feet. He feels disabled by this. He feels it is lifestyle limiting.   PLAN:  Risks benefits possible complications and procedure details of left femoropopliteal bypass were discussed with the patient today. These include but are not limited to bleeding infection graft thrombosis possible limb loss. He understands and agrees to proceed. Operation will be scheduled for 08/16/2015. He will need to stop his Plavix 10 days prior to this. We will also obtain the results of his stress test from  Dr. Bettina Gavia.  Ruta Hinds, MD [facility] [place] Office: [phone] Pager: [phone]

## 2015-08-23 NOTE — Anesthesia Postprocedure Evaluation (Signed)
  Anesthesia Post-op Note  Patient: Joseph Hebert.  Procedure(s) Performed: Procedure(s): LEFT FEMORAL-POPLITEAL ARTERY BYPASS WITH GORETEX GRAFT (Left)  Patient Location: PACU  Anesthesia Type:General  Level of Consciousness: awake, alert , oriented and patient cooperative  Airway and Oxygen Therapy: Patient Spontanous Breathing and Patient connected to nasal cannula oxygen  Post-op Pain: none  Post-op Assessment: Post-op Vital signs reviewed, Patient's Cardiovascular Status Stable, Respiratory Function Stable, Patent Airway, No signs of Nausea or vomiting and Pain level controlled              Post-op Vital Signs: Reviewed and stable  Last Vitals:  Filed Vitals:   08/23/15 1423  BP: 129/55  Pulse: 55  Temp:   Resp: 12    Complications: No apparent anesthesia complications

## 2015-08-23 NOTE — Interval H&P Note (Signed)
History and Physical Interval Note:  08/23/2015 7:28 AM  [name]  has presented today for surgery, with the diagnosis of Peripheral vascular disease with left lower extremity claudication I70.212  The various methods of treatment have been discussed with the patient and family. After consideration of risks, benefits and other options for treatment, the patient has consented to  Procedure(s): LEFT FEMORAL-POPLITEAL ARTERY BYPASS GRAFT (Left) as a surgical intervention .  The patient's history has been reviewed, patient examined, no change in status, stable for surgery.  I have reviewed the patient's chart and labs.  Questions were answered to the patient's satisfaction.     Ruta Hinds

## 2015-08-24 ENCOUNTER — Inpatient Hospital Stay (HOSPITAL_COMMUNITY): Payer: Medicare Other

## 2015-08-24 ENCOUNTER — Encounter (HOSPITAL_COMMUNITY): Payer: Self-pay | Admitting: Vascular Surgery

## 2015-08-24 DIAGNOSIS — Z9889 Other specified postprocedural states: Secondary | ICD-10-CM | POA: Diagnosis not present

## 2015-08-24 LAB — BASIC METABOLIC PANEL
ANION GAP: 11 (ref 5–15)
BUN: 29 mg/dL — ABNORMAL HIGH (ref 6–20)
CALCIUM: 8.7 mg/dL — AB (ref 8.9–10.3)
CO2: 21 mmol/L — AB (ref 22–32)
Chloride: 105 mmol/L (ref 101–111)
Creatinine, Ser: 2.26 mg/dL — ABNORMAL HIGH (ref 0.61–1.24)
GFR, EST AFRICAN AMERICAN: 32 mL/min — AB (ref >60)
GFR, EST NON AFRICAN AMERICAN: 28 mL/min — AB (ref >60)
Glucose, Bld: 145 mg/dL — ABNORMAL HIGH (ref 65–99)
POTASSIUM: 4.5 mmol/L (ref 3.5–5.1)
Sodium: 137 mmol/L (ref 135–145)

## 2015-08-24 LAB — CBC
HEMATOCRIT: 36.2 % — AB (ref 39.0–52.0)
Hemoglobin: 11.7 g/dL — ABNORMAL LOW (ref 13.0–17.0)
MCH: 29.8 pg (ref 26.0–34.0)
MCHC: 32.3 g/dL (ref 30.0–36.0)
MCV: 92.3 fL (ref 78.0–100.0)
Platelets: 336 10*3/uL (ref 150–400)
RBC: 3.92 MIL/uL — AB (ref 4.22–5.81)
RDW: 14.3 % (ref 11.5–15.5)
WBC: 10.3 10*3/uL (ref 4.0–10.5)

## 2015-08-24 MED ORDER — INFLUENZA VAC SPLIT QUAD 0.5 ML IM SUSY
0.5000 mL | PREFILLED_SYRINGE | INTRAMUSCULAR | Status: AC
  Administered 2015-08-26: 0.5 mL via INTRAMUSCULAR
  Filled 2015-08-24: qty 0.5

## 2015-08-24 NOTE — Progress Notes (Signed)
   Pt arrived to the unit from 2S, oriented to the unit and room, VSS, telemetry applied and verified; pt currently denies pain, pt skin intact with no pressure ulcer or wounds noted. Pt resting comfortably in chair with call light within reach and family in the room. Will continue to monitor pt.

## 2015-08-24 NOTE — Progress Notes (Addendum)
  [facility] Progress Note  Subjective  - POD #1  Soreness with groin incisions.   Objective Filed Vitals:   08/24/15 0740  BP: 129/65  Pulse: 58  Temp: 97.9 F (36.6 C)  Resp: 20    Intake/Output Summary (Last 24 hours) at 08/24/15 0803 Last data filed at 08/24/15 0658  Gross per 24 hour  Intake   3740 ml  Output   1200 ml  Net   2540 ml   Left leg wounds clean and intact. Some mild drainage on middle incision dressing.  1+ palpable left DP pulse. Biphasic left PT doppler flow  Assessment/Planning: 69 y.o. male is s/p: left femoral to above-knee popliteal bypass with propaten 1 Day Post-Op   Bypass patent.  Elevated creatinine at baseline. 1.9 at admission. Today 2.26. Continue gentle hydration.  ABLA: tolerating. Ok to restart plavix today.  Mobilize today.  Transfer to [facility] 08/24/2015 8:03 AM --  Laboratory CBC    Component Value Date/Time   WBC 10.3 08/24/2015 0043   HGB 11.7* 08/24/2015 0043   HCT 36.2* 08/24/2015 0043   PLT 336 08/24/2015 0043    BMET    Component Value Date/Time   NA 137 08/24/2015 0043   K 4.5 08/24/2015 0043   CL 105 08/24/2015 0043   CO2 21* 08/24/2015 0043   GLUCOSE 145* 08/24/2015 0043   BUN 29* 08/24/2015 0043   CREATININE 2.26* 08/24/2015 0043   CALCIUM 8.7* 08/24/2015 0043   GFRNONAA 28* 08/24/2015 0043   GFRAA 32* 08/24/2015 0043    COAG Lab Results  Component Value Date   INR 0.93 08/17/2015   INR 0.92 10/26/2010   No results found for: PTT  Antibiotics Anti-infectives    Start     Dose/Rate Route Frequency Ordered Stop   08/24/15 0730  vancomycin (VANCOCIN) 1,500 mg in sodium chloride 0.9 % 500 mL IVPB     1,500 mg 250 mL/hr over 120 Minutes Intravenous  Once 08/23/15 1756     08/23/15 1800  vancomycin (VANCOCIN) IVPB 1000 mg/200 mL premix  Status:  Discontinued     1,000 mg 200 mL/hr over 60 Minutes Intravenous Every 12 hours 08/23/15 1747 08/23/15 1755   08/23/15  0713  vancomycin (VANCOCIN) 1 GM/200ML IVPB    Comments:  Wandalee Ferdinand   : cabinet override      08/23/15 0713 08/23/15 0815   08/23/15 0559  dextrose 5 % with cefUROXime (ZINACEF) ADS Med    Comments:  Starleen Arms   : cabinet override      08/23/15 0559 08/23/15 0727   08/23/15 0558  cefUROXime (ZINACEF) 1.5 g in dextrose 5 % 50 mL IVPB  Status:  Discontinued     1.5 g 100 mL/hr over 30 Minutes Intravenous 30 min pre-op 08/23/15 0558 08/23/15 1729       Virgina Jock, PA-C [facility] Office: [phone] Pager: [phone] 08/24/2015 8:03 AM   Agree with above.  Will follow creatinine for now.  Needs to get out of bed and ambulate. Possible d/c tomorrow if renal function stable and pain controlled.  Ruta Hinds, MD [facility] [place] Office: [phone] Pager: [phone]

## 2015-08-24 NOTE — Evaluation (Signed)
Physical Therapy Evaluation Patient Details Name: Joseph Hebert. MRN: [id number] DOB: 1946/07/09 Today's Date: 08/24/2015   History of Present Illness  pt is a 69 y/o male admitted with L LE claudication s/p common femoral to above knee popliteal BPG.  Clinical Impression  Pt admitted with/for claudication with activity s/p L LE fem-pop BPGing..  Pt currently limited functionally due to the problems listed below.  (see problems list.)  Pt will benefit from PT to maximize function and safety to be able to get home safely with available assist of family.     Follow Up Recommendations No PT follow up    Equipment Recommendations  Rolling walker with 5" wheels    Recommendations for Other Services       Precautions / Restrictions Precautions Precautions: Fall      Mobility  Bed Mobility               General bed mobility comments: sitting up in the chair on arrival  Transfers Overall transfer level: Needs assistance   Transfers: Sit to/from Stand Sit to Stand: Supervision            Ambulation/Gait Ambulation/Gait assistance: Supervision Ambulation Distance (Feet): 75 Feet Assistive device: None Gait Pattern/deviations: Step-through pattern;Decreased step length - right;Decreased step length - left;Wide base of support Gait velocity: slow Gait velocity interpretation: Below normal speed for age/gender General Gait Details: short step-length waddling gait  Stairs            Wheelchair Mobility    Modified Rankin (Stroke Patients Only)       Balance Overall balance assessment: Needs assistance Sitting-balance support: Feet supported;No upper extremity supported Sitting balance-Leahy Scale: Good     Standing balance support: No upper extremity supported Standing balance-Leahy Scale: Fair                               Pertinent Vitals/Pain Pain Assessment: 0-10 Pain Score: 10-Worst pain ever Pain Location: groin and L thigh  incisions Pain Descriptors / Indicators: Burning;Aching Pain Intervention(s): Monitored during session;Limited activity within patient's tolerance    Home Living Family/patient expects to be discharged to:: Private residence Living Arrangements: Spouse/significant other Available Help at Discharge: Family;Available 24 hours/day Type of Home: House Home Access: Stairs to enter Entrance Stairs-Rails: None Entrance Stairs-Number of Steps: 2 Home Layout: Two level;Laundry or work area in basement;Able to live on main level with bedroom/bathroom        Prior Function Level of Independence: Independent         Comments: lately it has been difficult to walk more than 50-100 feet without pain.     Hand Dominance        Extremity/Trunk Assessment   Upper Extremity Assessment: Defer to OT evaluation           Lower Extremity Assessment: Overall WFL for tasks assessed;LLE deficits/detail         Communication   Communication: No difficulties  Cognition Arousal/Alertness: Awake/alert Behavior During Therapy: WFL for tasks assessed/performed Overall Cognitive Status: Within Functional Limits for tasks assessed                      General Comments      Exercises        Assessment/Plan    PT Assessment Patient needs continued PT services  PT Diagnosis Difficulty walking;Acute pain   PT Problem List Decreased activity tolerance;Decreased mobility;Decreased knowledge of  use of DME;Decreased knowledge of precautions;Pain  PT Treatment Interventions Gait training;Functional mobility training;Therapeutic activities;Therapeutic exercise;Patient/family education   PT Goals (Current goals can be found in the Care Plan section) Acute Rehab PT Goals Patient Stated Goal: Able to get around independently and do the things he wanted to do. PT Goal Formulation: With patient Time For Goal Achievement: 08/31/15 Potential to Achieve Goals: Good    Frequency Min  3X/week   Barriers to discharge        Co-evaluation               End of Session   Activity Tolerance: Patient tolerated treatment well;Patient limited by pain Patient left: in chair;with call bell/phone within reach;with family/visitor present Nurse Communication: Mobility status         Time: [id number] PT Time Calculation (min) (ACUTE ONLY): 19 min   Charges:   PT Evaluation $Initial PT Evaluation Tier I: 1 Procedure     PT G Codes:        Seyon Strader, Tessie Fass 08/24/2015, 4:04 PM 08/24/2015  Donnella Sham, [place] [phone]  (pager)

## 2015-08-24 NOTE — Evaluation (Signed)
Occupational Therapy Evaluation Patient Details Name: Joseph Hebert. MRN: [id number] DOB: 02/18/1946 Today's Date: 08/24/2015    History of Present Illness pt is a 69 y/o male admitted with L LE claudication s/p common femoral to above knee popliteal BPG.   Clinical Impression   Pt admitted with above. He demonstrates the below listed deficits and will benefit from continued OT to maximize safety and independence with BADLs.  Pt requires mod A for LB ADLs due to increased pain.  Recommend continued OT while in hospital.       Follow Up Recommendations  No OT follow up;Supervision - Intermittent    Equipment Recommendations  3 in 1 bedside comode    Recommendations for Other Services       Precautions / Restrictions Precautions Precautions: Fall      Mobility Bed Mobility               General bed mobility comments: sitting up in the chair on arrival  Transfers Overall transfer level: Needs assistance Equipment used: Rolling walker (2 wheeled) Transfers: Sit to/from [employer] Sit to Stand: Supervision Stand pivot transfers: Supervision            Balance Overall balance assessment: Needs assistance Sitting-balance support: Feet supported Sitting balance-Leahy Scale: Good     Standing balance support: No upper extremity supported Standing balance-Leahy Scale: Fair                              ADL Overall ADL's : Needs assistance/impaired Eating/Feeding: Independent   Grooming: Wash/dry hands;Wash/dry face;Oral care;Brushing hair;Set up;Sitting   Upper Body Bathing: Set up;Sitting   Lower Body Bathing: Moderate assistance;Sit to/from stand   Upper Body Dressing : Set up;Sitting   Lower Body Dressing: Moderate assistance;Sit to/from stand   Toilet Transfer: Ambulation;Comfort height toilet;Supervision/safety   Toileting- Clothing Manipulation and Hygiene: Minimal assistance;Sit to/from stand        Functional mobility during ADLs: Rolling walker;Supervision/safety General ADL Comments: Pt unable to access Lt food due to increased pain      Vision     Perception     Praxis      Pertinent Vitals/Pain Pain Assessment: Faces Pain Score: 10-Worst pain ever Faces Pain Scale: Hurts whole lot Pain Location: when attempting to access Lt foot for LB ADLs  Pain Descriptors / Indicators: Aching;Constant Pain Intervention(s): Limited activity within patient's tolerance;Monitored during session     Hand Dominance Right   Extremity/Trunk Assessment Upper Extremity Assessment Upper Extremity Assessment: Overall WFL for tasks assessed   Lower Extremity Assessment Lower Extremity Assessment: Defer to PT evaluation LLE: Unable to fully assess due to pain   Cervical / Trunk Assessment Cervical / Trunk Assessment: Normal   Communication Communication Communication: No difficulties   Cognition Arousal/Alertness: Awake/alert Behavior During Therapy: WFL for tasks assessed/performed Overall Cognitive Status: Within Functional Limits for tasks assessed                     General Comments       Exercises       Shoulder Instructions      Home Living Family/patient expects to be discharged to:: Private residence Living Arrangements: Spouse/significant other Available Help at Discharge: Family;Available 24 hours/day Type of Home: House Home Access: Stairs to enter [employer] of Steps: 2 Entrance Stairs-Rails: None Home Layout: Two level;Laundry or work area in basement;Able to live on main level with bedroom/bathroom  Alternate Level Stairs-Number of Steps: flight Alternate Level Stairs-Rails: Right Bathroom Shower/Tub: Occupational psychologist: Standard     Home Equipment: None          Prior Functioning/Environment Level of Independence: Independent        Comments: lately it has been difficult to walk more than 50-100 feet without  pain.    OT Diagnosis: Generalized weakness;Acute pain   OT Problem List: Decreased strength;Decreased activity tolerance;Impaired balance (sitting and/or standing);Pain;Decreased knowledge of use of DME or AE   OT Treatment/Interventions: Self-care/ADL training;DME and/or AE instruction;Therapeutic activities;Patient/family education;Balance training    OT Goals(Current goals can be found in the care plan section) Acute Rehab OT Goals Patient Stated Goal: Able to get around independently and do the things he wanted to do. OT Goal Formulation: With patient Time For Goal Achievement: 09/07/15 Potential to Achieve Goals: Good ADL Goals Pt Will Perform Grooming: with modified independence;standing Pt Will Perform Lower Body Bathing: with modified independence;sit to/from stand Pt Will Perform Lower Body Dressing: with modified independence;sit to/from stand Pt Will Transfer to Toilet: with modified independence;ambulating;regular height toilet;bedside commode;grab bars Pt Will Perform Toileting - Clothing Manipulation and hygiene: with modified independence;sit to/from stand Pt Will Perform Tub/Shower Transfer: Shower transfer;with modified independence;ambulating;rolling walker  OT Frequency: Min 2X/week   Barriers to D/C:            Co-evaluation              End of Session Equipment Utilized During Treatment: Surveyor, mining Communication: Mobility status  Activity Tolerance: Patient limited by pain Patient left: in chair;with call bell/phone within reach;with family/visitor present   Time: 1550-1609 OT Time Calculation (min): 19 min Charges:  OT General Charges $OT Visit: 1 Procedure OT Evaluation $Initial OT Evaluation Tier I: 1 Procedure G-Codes:    Lucille Passy M 09-21-2015, 4:46 PM

## 2015-08-24 NOTE — Progress Notes (Signed)
VASCULAR LAB PRELIMINARY  ARTERIAL  ABI completed:    RIGHT    LEFT    PRESSURE WAVEFORM  PRESSURE WAVEFORM  BRACHIAL 150 Triphasic BRACHIAL 166 Triphasic  DP 127 Biphasic DP 132 Biphasic  AT   AT    PT 122 Biphasic PT 146 Triphasic  PER   PER    GREAT TOE  NA GREAT TOE  NA    RIGHT LEFT  ABI 0.77 0.88    The right ABI is suggestive of moderate arterial insufficiency at rest. The left ABI is suggestive of mild arterial insufficiency at rest.  08/24/2015 12:41 PM Maudry Mayhew, RVT, RDCS, RDMS

## 2015-08-24 NOTE — Progress Notes (Signed)
Utilization review completed. Casie Sturgeon, RN, BSN. 

## 2015-08-25 LAB — CREATININE, SERUM
Creatinine, Ser: 2.32 mg/dL — ABNORMAL HIGH (ref 0.61–1.24)
GFR calc Af Amer: 31 mL/min — ABNORMAL LOW (ref >60)
GFR calc non Af Amer: 27 mL/min — ABNORMAL LOW (ref >60)

## 2015-08-25 MED ORDER — SODIUM CHLORIDE 0.45 % IV SOLN
INTRAVENOUS | Status: DC
  Administered 2015-08-25: 22:00:00 via INTRAVENOUS

## 2015-08-25 NOTE — Care Management Note (Signed)
Case Management Note Marvetta Gibbons RN, BSN Unit 2W-Case Manager [phone]  Patient Details  Name: Quentavious Zombek. MRN: [id number] Date of Birth: February 11, 1946  Subjective/Objective:       Pt s/p fempop bypass graft             Action/Plan: PTA pt lived at home with spouse- plan is to return home with spouse- per PT/OT no recommendations for any HH f/u- however OT recommending 3n1 for home- spoke with pt and wife at bedside and pt would like 3n1 for home- states that he already has RW- MD please place DME order for 3n1- will arrange with [facility] once order placed-   Expected Discharge Date:       08/26/15           Expected Discharge Plan:  Home/Self Care  In-House Referral:     Discharge planning Services  CM Consult  Post Acute Care Choice:  Durable Medical Equipment Choice offered to:  Patient  DME Arranged:  3-N-1 DME Agency:  [place]:  NA [place] Agency:  NA  Status of Service:  In process, will continue to follow  Medicare Important Message Given:    Date Medicare IM Given:    Medicare IM give by:    Date Additional Medicare IM Given:    Additional Medicare Important Message give by:     If discussed at [place] of Stay Meetings, dates discussed:    Additional Comments:  Dawayne [name], RN 08/25/2015, 2:48 PM

## 2015-08-25 NOTE — Progress Notes (Signed)
Physical Therapy Treatment Patient Details Name: Joseph Hebert. MRN: [id number] DOB: 03/24/1946 Today's Date: 08/25/2015    History of Present Illness pt is a 69 y/o male admitted with L LE claudication s/p common femoral to above knee popliteal BPG.    PT Comments    Patient continues to require education regarding safe and proper use of DME. Once ambulating with RW, he is steady and beginning to incr stride length.  Follow Up Recommendations  No PT follow up     Equipment Recommendations  None recommended by PT (pt reports has RW)    Recommendations for Other Services       Precautions / Restrictions Precautions Precautions: Fall    Mobility  Bed Mobility Overal bed mobility: Modified Independent Bed Mobility: Supine to Sit;Sit to Supine     Supine to sit: Modified independent (Device/Increase time) Sit to supine: Modified independent (Device/Increase time)   General bed mobility comments: with rail; returned to bed in a.m. for RN to put in IV; on return assisted OOB  Transfers Overall transfer level: Needs assistance Equipment used: Rolling walker (2 wheeled);None Transfers: Sit to/from Stand Sit to Stand: Supervision;Min guard         General transfer comment: x2; wide stance slightly unsteady without device; vc for use of RW  Ambulation/Gait Ambulation/Gait assistance: Supervision Ambulation Distance (Feet): 180 Feet Assistive device: Rolling walker (2 wheeled) Gait Pattern/deviations: Step-through pattern;Decreased stride length;Antalgic Gait velocity: slow   General Gait Details: short step-length waddling gait; attempted without RW and pt with incr "limp" and discussed strain this causes on his back and other joints; agreed to use RW   Stairs            Wheelchair Mobility    Modified Rankin (Stroke Patients Only)       Balance     Sitting balance-Leahy Scale: Good       Standing balance-Leahy Scale: Fair                      Cognition Arousal/Alertness: Awake/alert Behavior During Therapy: WFL for tasks assessed/performed Overall Cognitive Status: Within Functional Limits for tasks assessed                      Exercises General Exercises - Lower Extremity Hip Flexion/Marching: AROM;Both;10 reps;Standing;Limitations (as precursor to stair training) Hip Flexion/Marching Limitations: LLE with incr effort/time; instructed pt he will not use this leg to step up, however he said the exercise would do it good    General Comments        Pertinent Vitals/Pain Pain Assessment: 0-10 Pain Score: 5  Pain Location: Lt groin Pain Intervention(s): Limited activity within patient's tolerance;Monitored during session;Repositioned    Home Living                      Prior Function            PT Goals (current goals can now be found in the care plan section) Acute Rehab PT Goals Patient Stated Goal: Able to get around independently and do the things he wanted to do. Time For Goal Achievement: 08/31/15 Progress towards PT goals: Progressing toward goals    Frequency  Min 3X/week    PT Plan Current plan remains appropriate    Co-evaluation             End of Session Equipment Utilized During Treatment: Gait belt Activity Tolerance: Patient tolerated treatment well Patient left:  in chair;with call bell/phone within reach;with family/visitor present     Time: 1344-1406 PT Time Calculation (min) (ACUTE ONLY): 22 min  Charges:  $Gait Training: 8-22 mins                    G Codes:      Joseph Hebert 2015/09/11, 2:14 PM Pager [phone]

## 2015-08-25 NOTE — Progress Notes (Signed)
Occupational Therapy Treatment Patient Details Name: Joseph Hebert. MRN: [id number] DOB: 01/20/1946 Today's Date: 08/25/2015    History of present illness pt is a 69 y/o male admitted with L LE claudication s/p common femoral to above knee popliteal BPG.   OT comments  Patient progressing nicely towards OT goals, continue plan of care for now. Pt very motivated to go home in the next day or so. Pt ambulated around room without RW and performed BR transfers using 3-in-1, encouraged use of 3-in-1 over toilet and in walk-in shower for safe transfers and showers. Pt reports his wife will be available to assist post acute d/c and states she can assist with LB ADLs prn. Encouraged use of LH sponge for LB bathing.  Pt with shortness of breath during OT session, however 02 sats remained 92% or greater entire session.   Follow Up Recommendations  No OT follow up;Supervision - Intermittent    Equipment Recommendations  3 in 1 bedside comode    Recommendations for Other Services  None at this time   Precautions / Restrictions Precautions Precautions: Fall Restrictions Weight Bearing Restrictions: No    Mobility Bed Mobility Overal bed mobility: Needs Assistance Bed Mobility: Supine to Sit     Supine to sit: Supervision     General bed mobility comments: Supervision for safety. HOB down and no use of bed rails to simulate this transfer like at home  Transfers Overall transfer level: Needs assistance Equipment used: None Transfers: Sit to/from Stand Sit to Stand: Supervision         General transfer comment: Supervision for safety    Balance Overall balance assessment: Needs assistance Sitting-balance support: No upper extremity supported;Feet supported Sitting balance-Leahy Scale: Good     Standing balance support: No upper extremity supported;During functional activity Standing balance-Leahy Scale: Fair   ADL Overall ADL's : Needs assistance/impaired Eating/Feeding:  Independent   Grooming: Modified independent;Standing;Wash/dry hands;Oral care   Upper Body Bathing: Set up;Sitting   Lower Body Bathing: Moderate assistance;Sit to/from stand Lower Body Bathing Details (indicate cue type and reason): encouraged use of LH sponge Upper Body Dressing : Set up;Sitting;Standing Upper Body Dressing Details (indicate cue type and reason): robe Lower Body Dressing: Moderate assistance;Sit to/from stand   Toilet Transfer: Ambulation;Comfort height toilet;Supervision/safety       Tub/ Shower Transfer: Walk-in shower;3 in 1;Grab bars;Supervision/safety;Cueing for safety;Cueing for sequencing   Functional mobility during ADLs: Supervision/safety General ADL Comments: Pt continues to be unable to access Lt food due to increased pain, pt reports that his wife can assist with this post acute d/c. Pt able to ambulate without AD and close supervision. Pt ambulated into BR for toilet transfer and walk-in shower transfer using 3-in-1. Encouraged pt to use 3-in-1 at home.       Cognition   Behavior During Therapy: WFL for tasks assessed/performed Overall Cognitive Status: Within Functional Limits for tasks assessed                 Pertinent Vitals/ Pain       Pain Assessment: 0-10 Pain Score: 7  Pain Location: LLE during mobility/movement Pain Descriptors / Indicators: Aching;Burning;Tender Pain Intervention(s): Monitored during session;Repositioned   Frequency Min 2X/week     Progress Toward Goals  OT Goals(current goals can now befound in the care plan section)  Progress towards OT goals: Progressing toward goals     Plan Discharge plan remains appropriate    End of Session   Activity Tolerance Patient tolerated treatment well  Patient Left in chair;with call bell/phone within reach  Nurse Communication Other (comment) (Talked with NT about encourating pt to ambulate every 2 hours, reiterated importance of staff present to assist to pt and  NT)      Time: [id number] OT Time Calculation (min): 23 min  Charges: OT General Charges $OT Visit: 1 Procedure OT Treatments $Self Care/Home Management : 23-37 mins  Rosaleigh Brazzel , MS, OTR/L, CLT Pager: [phone]  08/25/2015, 9:10 AM

## 2015-08-25 NOTE — Progress Notes (Addendum)
[facility] Progress Note  Subjective  - POD #2  Having burning pain right anterior thigh. This has been present well before surgery. Left groin soreness  Objective Filed Vitals:   08/25/15 0451  BP: 150/65  Pulse: 69  Temp: 97.9 F (36.6 C)  Resp: 20    Intake/Output Summary (Last 24 hours) at 08/25/15 0736 Last data filed at 08/25/15 0600  Gross per 24 hour  Intake   1200 ml  Output   1600 ml  Net   -400 ml   Incisions healing well Palpable left PT pulse.   ABIs    RIGHT    LEFT    PRESSURE WAVEFORM  PRESSURE WAVEFORM  BRACHIAL 150 Triphasic BRACHIAL 166 Triphasic  DP 127 Biphasic DP 132 Biphasic  AT   AT    PT 122 Biphasic PT 146 Triphasic  PER   PER    GREAT TOE  NA GREAT TOE  NA    RIGHT LEFT  ABI 0.77 0.88        Assessment/Planning: 69 y.o. male is s/p: left femoral to above-knee popliteal bypass with propaten 2 Days Post-Op   ABIs left improved. Palpable left PT pulse. Recheck creatinine this am.  Continue to mobilize. Patient doesn't feel ready for discharge today. Said he left too early last surgery and had to come back. Likely discharge tomorrow.   Alvia Grove 08/25/2015 7:36 AM --  Laboratory CBC    Component Value Date/Time   WBC 10.3 08/24/2015 0043   HGB 11.7* 08/24/2015 0043   HCT 36.2* 08/24/2015 0043   PLT 336 08/24/2015 0043    BMET    Component Value Date/Time   NA 137 08/24/2015 0043   K 4.5 08/24/2015 0043   CL 105 08/24/2015 0043   CO2 21* 08/24/2015 0043   GLUCOSE 145* 08/24/2015 0043   BUN 29* 08/24/2015 0043   CREATININE 2.26* 08/24/2015 0043   CALCIUM 8.7* 08/24/2015 0043   GFRNONAA 28* 08/24/2015 0043   GFRAA 32* 08/24/2015 0043    COAG Lab Results  Component Value Date   INR 0.93 08/17/2015   INR 0.92 10/26/2010   No results found for: PTT  Antibiotics Anti-infectives    Start     Dose/Rate Route Frequency Ordered  Stop   08/24/15 0730  vancomycin (VANCOCIN) 1,500 mg in sodium chloride 0.9 % 500 mL IVPB     1,500 mg 250 mL/hr over 120 Minutes Intravenous  Once 08/23/15 1756 08/24/15 0858   08/23/15 1800  vancomycin (VANCOCIN) IVPB 1000 mg/200 mL premix  Status:  Discontinued     1,000 mg 200 mL/hr over 60 Minutes Intravenous Every 12 hours 08/23/15 1747 08/23/15 1755   08/23/15 0713  vancomycin (VANCOCIN) 1 GM/200ML IVPB    Comments:  Wandalee Ferdinand   : cabinet override      08/23/15 0713 08/23/15 0815   08/23/15 0559  dextrose 5 % with cefUROXime (ZINACEF) ADS Med    Comments:  Starleen Arms   : cabinet override      08/23/15 0559 08/23/15 0727   08/23/15 0558  cefUROXime (ZINACEF) 1.5 g in dextrose 5 % 50 mL IVPB  Status:  Discontinued     1.5 g 100 mL/hr over 30 Minutes Intravenous 30 min pre-op 08/23/15 0558 08/23/15 1729       Virgina Jock, PA-C [facility] Office: [phone] Pager: [phone] 08/25/2015 7:36 AM  Easily palpable pulse left foot.  Incisions clean.  Still with pain issues.  Walk more today.  Recheck renal function. Most likely d/c tomorrow  Gentle IV hydration and stop lisinopril to see if renal function improves  Ruta Hinds, MD [facility] [place]: [phone] Pager: [phone]

## 2015-08-26 ENCOUNTER — Telehealth: Payer: Self-pay | Admitting: Vascular Surgery

## 2015-08-26 DIAGNOSIS — I70212 Atherosclerosis of native arteries of extremities with intermittent claudication, left leg: Secondary | ICD-10-CM | POA: Diagnosis not present

## 2015-08-26 LAB — BASIC METABOLIC PANEL
ANION GAP: 7 (ref 5–15)
BUN: 29 mg/dL — AB (ref 6–20)
CHLORIDE: 106 mmol/L (ref 101–111)
CO2: 24 mmol/L (ref 22–32)
Calcium: 8.5 mg/dL — ABNORMAL LOW (ref 8.9–10.3)
Creatinine, Ser: 1.78 mg/dL — ABNORMAL HIGH (ref 0.61–1.24)
GFR calc Af Amer: 43 mL/min — ABNORMAL LOW (ref >60)
GFR, EST NON AFRICAN AMERICAN: 37 mL/min — AB (ref >60)
GLUCOSE: 87 mg/dL (ref 65–99)
POTASSIUM: 4.2 mmol/L (ref 3.5–5.1)
Sodium: 137 mmol/L (ref 135–145)

## 2015-08-26 MED ORDER — OXYCODONE-ACETAMINOPHEN 5-325 MG PO TABS: 1.0000 | ORAL_TABLET | Freq: Four times a day (QID) | ORAL | Status: DC | PRN

## 2015-08-26 NOTE — Telephone Encounter (Signed)
Patient is still in the hospital at this time, wcb next week, dpm

## 2015-08-26 NOTE — Progress Notes (Signed)
Physical Therapy Treatment Patient Details Name: Joseph Hebert. MRN: [id number] DOB: March 17, 1946 Today's Date: 08/26/2015    History of Present Illness pt is a 69 y/o male admitted with L LE claudication s/p common femoral to above knee popliteal BPG.    PT Comments    Spoke with patient re: why he feels unable to d/c home today. He reports he can't bend far enough to put his socks on and he's afraid he will fall when walking. Agrees his wife can assist with his socks and will be with him, however she normally sleeps late into AM and he's afraid she won't be available when he needs her. Pt ambulated modified independent with RW in hall and pt room (tighter spaces). Pt went up/down 2 steps without rail or any UE support (pt's choice) with minguard assist. By end of session, he noted his leg felt better after walking and if he walks more today he will feel ready. Instructed pt not to wait until someone offers to help him walk and to call and request assistance. RN informed that pt is modified independent and could walk alone with RW in my opinion. He may need assist accessing RW, as it often out of reach.   Follow Up Recommendations  No PT follow up     Equipment Recommendations  None recommended by PT (pt reports has RW)    Recommendations for Other Services       Precautions / Restrictions Precautions Precautions: Fall Restrictions Weight Bearing Restrictions: No    Mobility  Bed Mobility Overal bed mobility: Modified Independent Bed Mobility: Supine to Sit     Supine to sit: Modified independent (Device/Increase time)        Transfers Overall transfer level: Needs assistance Equipment used: Rolling walker (2 wheeled) Transfers: Sit to/from Stand Sit to Stand: Supervision;Min guard         General transfer comment: wide stance; remembers to advance LLE prior to initiating sitting;  vc for use of RW; minguard to sit due to uncontrolled  descent  Ambulation/Gait Ambulation/Gait assistance: Modified independent (Device/Increase time) Ambulation Distance (Feet): 260 Feet Assistive device: Rolling walker (2 wheeled) Gait Pattern/deviations: Step-through pattern;Decreased stride length;Trunk flexed Gait velocity: slow   General Gait Details: able to lengthen stride with cues as leg "limbered up"; vc for upright posture to decr strain on low back (h/o chronic LBP)   Stairs Stairs: Yes Stairs assistance: Min guard Stair Management: No rails;Step to pattern;Forwards Number of Stairs: 2 General stair comments: pt insisted he didn't even want to hold PTs hand and completed without UE support  Wheelchair Mobility    Modified Rankin (Stroke Patients Only)       Balance           Standing balance support: No upper extremity supported Standing balance-Leahy Scale: Fair (able to static stand without UE support)                      Cognition Arousal/Alertness: Awake/alert Behavior During Therapy: WFL for tasks assessed/performed Overall Cognitive Status: Within Functional Limits for tasks assessed                      Exercises General Exercises - Lower Extremity Heel Slides: AROM;Left;20 reps (min ROM at first prog to max ROM can tolerate)    General Comments        Pertinent Vitals/Pain Pain Assessment: 0-10 Pain Score: 4  (after walking, reported pain felt better) Pain  Location: Lt groin Pain Intervention(s): Limited activity within patient's tolerance;Monitored during session;Repositioned (warm-up heelslides prior to get OOB)    Home Living                      Prior Function            PT Goals (current goals can now be found in the care plan section) Acute Rehab PT Goals Patient Stated Goal: Able to get around independently and do the things he wanted to do. Time For Goal Achievement: 08/31/15 Progress towards PT goals: Progressing toward goals (some goals surpassed;  )    Frequency  Min 3X/week    PT Plan Current plan remains appropriate    Co-evaluation             End of Session Equipment Utilized During Treatment: Gait belt Activity Tolerance: Patient tolerated treatment well Patient left: in chair;with call bell/phone within reach;with family/visitor present     Time: [id number] PT Time Calculation (min) (ACUTE ONLY): 24 min  Charges:  $Gait Training: 23-37 mins                    G Codes:      Joseph Hebert 09-11-2015, 9:43 AM Pager [phone]

## 2015-08-26 NOTE — Care Management Note (Signed)
Case Management Note Marvetta Gibbons RN, BSN Unit 2W-Case Manager [phone]  Patient Details  Name: Joseph Hebert. MRN: [id number] Date of Birth: [birth year]  Subjective/Objective:       Pt s/p fempop bypass graft             Action/Plan: PTA pt lived at home with spouse- plan is to return home with spouse- per PT/OT no recommendations for any HH f/u- however OT recommending 3n1 for home- spoke with pt and wife at bedside and pt would like 3n1 for home- states that he already has RW- MD please place DME order for 3n1- will arrange with [facility] once order placed-   Expected Discharge Date:       08/26/15           Expected Discharge Plan:  Home/Self Care  In-House Referral:     Discharge planning Services  CM Consult  Post Acute Care Choice:  Durable Medical Equipment Choice offered to:  Patient  DME Arranged:  3-N-1 DME Agency:  [place]:  NA [place] Agency:  NA  Status of Service:  Completed, signed off  Medicare Important Message Given:    Date Medicare IM Given:    Medicare IM give by:    Date Additional Medicare IM Given:    Additional Medicare Important Message give by:     If discussed at [place] of Stay Meetings, dates discussed:    Additional Comments:  08/26/15- pt for d/c later today- call made to [facility] with [facility] for DME need 3n1- to be delivered to room prior to discharge-   Dahlia Client Romeo Rabon, RN 08/26/2015, 11:21 AM

## 2015-08-26 NOTE — Care Management Important Message (Signed)
Important Message  Patient Details  Name: Joseph Hebert. MRN: [id number] Date of Birth: 1946/02/16   Medicare Important Message Given:  [facility] notification given    Nathen May 08/26/2015, 11:41 AMImportant Message  Patient Details  Name: Joseph Hebert. MRN: [id number] Date of Birth: May 24, 1946   Medicare Important Message Given:  Yes-second notification given    Nathen May 08/26/2015, 11:41 AM

## 2015-08-26 NOTE — Telephone Encounter (Signed)
-----   Message from Mena Goes, RN sent at 08/26/2015  9:19 AM EDT ----- Regarding: schedule   ----- Message -----    From: Alvia Grove, PA-C    Sent: 08/26/2015   7:49 AM      To: Vvs Charge Pool  S/p left femoral to above-knee popliteal bypass with propaten 08/23/15  F/u with Dr. [name] in 2 weeks.  Thanks Maudie Mercury

## 2015-08-26 NOTE — Progress Notes (Signed)
  [facility] Progress Note  Subjective  - POD #3  Pain improved. Still complaining of soreness with left groin incision.   Objective Filed Vitals:   08/26/15 0508  BP: 140/61  Pulse: 60  Temp: 98 F (36.7 C)  Resp: 20    Intake/Output Summary (Last 24 hours) at 08/26/15 0745 Last data filed at 08/26/15 0600  Gross per 24 hour  Intake   1080 ml  Output   3600 ml  Net  -2520 ml   Left leg incisions clean and intact.  Palpable left PT pulse.   Assessment/Planning: 69 y.o. male is s/p: left femoral to above-knee popliteal bypass with propaten 3 Days Post-Op   Renal function improved. Back to baseline creatinine. Saline lock IV.  Has ambulated well in the halls but does not feel ready for home. Says he needs to walk more.  Plan discharge this weekend.   Alvia Grove 08/26/2015 7:45 AM --  Laboratory CBC    Component Value Date/Time   WBC 10.3 08/24/2015 0043   HGB 11.7* 08/24/2015 0043   HCT 36.2* 08/24/2015 0043   PLT 336 08/24/2015 0043    BMET    Component Value Date/Time   NA 137 08/26/2015 0455   K 4.2 08/26/2015 0455   CL 106 08/26/2015 0455   CO2 24 08/26/2015 0455   GLUCOSE 87 08/26/2015 0455   BUN 29* 08/26/2015 0455   CREATININE 1.78* 08/26/2015 0455   CALCIUM 8.5* 08/26/2015 0455   GFRNONAA 37* 08/26/2015 0455   GFRAA 43* 08/26/2015 0455    COAG Lab Results  Component Value Date   INR 0.93 08/17/2015   INR 0.92 10/26/2010   No results found for: PTT  Antibiotics Anti-infectives    Start     Dose/Rate Route Frequency Ordered Stop   08/24/15 0730  vancomycin (VANCOCIN) 1,500 mg in sodium chloride 0.9 % 500 mL IVPB     1,500 mg 250 mL/hr over 120 Minutes Intravenous  Once 08/23/15 1756 08/24/15 0858   08/23/15 1800  vancomycin (VANCOCIN) IVPB 1000 mg/200 mL premix  Status:  Discontinued     1,000 mg 200 mL/hr over 60 Minutes Intravenous Every 12 hours 08/23/15 1747 08/23/15 1755   08/23/15 0713  vancomycin  (VANCOCIN) 1 GM/200ML IVPB    Comments:  Wandalee Ferdinand   : cabinet override      08/23/15 0713 08/23/15 0815   08/23/15 0559  dextrose 5 % with cefUROXime (ZINACEF) ADS Med    Comments:  Starleen Arms   : cabinet override      08/23/15 0559 08/23/15 0727   08/23/15 0558  cefUROXime (ZINACEF) 1.5 g in dextrose 5 % 50 mL IVPB  Status:  Discontinued     1.5 g 100 mL/hr over 30 Minutes Intravenous 30 min pre-op 08/23/15 0558 08/23/15 1729       Virgina Jock, PA-C [facility] Office: [phone] Pager: [phone] 08/26/2015 7:45 AM

## 2015-08-26 NOTE — Discharge Summary (Signed)
Discharge Summary     Joseph Hebert. 10-Jun-1946 69 y.o. male  [id number]  Admission Date: 08/23/2015  Discharge Date: 08/26/15  Physician: [name], MD  Admission Diagnosis: Peripheral vascular disease with left lower extremity claudication I70.212   HPI:   This is a 69 y.o. male patient of Dr. [name] who is s/p left CEA on 10/31/10 and also has a history of bilateral iliofemoral stents, [place].on 08/08/1998. He was last seen in our office in February 2016 for carotid artery Duplex and evaluation.  He returns today with c/o bilateral calf and thigh pain after walking about 100 yards, relieved by rest; this started gradually about March or April 2016, is not worsening.  Patient has Negative history of TIA or stroke symptom. The patient denies amaurosis fugax or monocular blindness. The patient denies facial drooping. Pt. denies hemiplegia. The patient denies receptive or expressive aphasia. Pt. denies extremity weakness.  Pt reports New Medical or Surgical History: diagnosed with gout in January 2016, right great toe and right ankle. His blood pressure keeps going up, started a third antihypertensive recently.  Pt denies cardiac problems.  Pt Diabetic: Yes, diet controlled, last A1C was 6.3% per pt. Pt smoker: smoker (4-6 cigarettes/day x 50+ yrs, decreased from 2 ppd)  Pt meds include: Statin : Yes ASA: Yes Other anticoagulants/antiplatelets: Plavix   Hospital Course:  The patient was admitted to the hospital and taken to the operating room on 08/23/2015 and underwent: Left femoral to above-knee popliteal bypass, Propaten    The pt tolerated the procedure well and was transported to the PACU in good condition.   By POD 1, his bypass graft was patent.   The pt did have an elevated creatinine of 2.26, which was up from 1.9 at admission.  By discharge, his creatinine was down to 1.78.   He continued gentle hydration.  He did have acute blood loss anemia,  which he was tolerating and his Plavix was restarted on POD 1.  He was transferred to the floor that day.  Post operative ABI's are as follows:  RIGHT    LEFT    PRESSURE WAVEFORM  PRESSURE WAVEFORM  BRACHIAL 150 Triphasic BRACHIAL 166 Triphasic  DP 127 Biphasic DP 132 Biphasic  AT   AT    PT 122 Biphasic PT 146 Triphasic  PER   PER    GREAT TOE  NA GREAT TOE  NA    RIGHT LEFT  ABI 0.77 0.88       By POD 3, the pt was doing well and ambulating without difficulty.  He is discharged later in the day.  The remainder of the hospital course consisted of increasing mobilization and increasing intake of solids without difficulty.  CBC    Component Value Date/Time   WBC 10.3 08/24/2015 0043   RBC 3.92* 08/24/2015 0043   HGB 11.7* 08/24/2015 0043   HCT 36.2* 08/24/2015 0043   PLT 336 08/24/2015 0043   MCV 92.3 08/24/2015 0043   MCH 29.8 08/24/2015 0043   MCHC 32.3 08/24/2015 0043   RDW 14.3 08/24/2015 0043    BMET    Component Value Date/Time   NA 137 08/26/2015 0455   K 4.2 08/26/2015 0455   CL 106 08/26/2015 0455   CO2 24 08/26/2015 0455   GLUCOSE 87 08/26/2015 0455   BUN 29* 08/26/2015 0455   CREATININE 1.78* 08/26/2015 0455   CALCIUM 8.5* 08/26/2015 0455   GFRNONAA 37* 08/26/2015 0455   GFRAA 43* 08/26/2015  0455       Discharge Instructions    Call MD for:  redness, tenderness, or signs of infection (pain, swelling, bleeding, redness, odor or green/yellow discharge around incision site)    Complete by:  As directed      Call MD for:  severe or increased pain, loss or decreased feeling  in affected limb(s)    Complete by:  As directed      Call MD for:  temperature >100.5    Complete by:  As directed      Discharge wound care:    Complete by:  As directed   Wash the groin wound with soap and water daily and pat dry. (No tub bath-only shower)  Then put a dry gauze or washcloth there to keep this area dry  daily and as needed.  Do not use Vaseline or neosporin on your incisions.  Only use soap and water on your incisions and then protect and keep dry.  Wash remaining wounds daily with soap and water and pat dry.     Driving Restrictions    Complete by:  As directed   No driving for 2 weeks     Increase activity slowly    Complete by:  As directed   Walk with assistance use walker or cane as needed     Lifting restrictions    Complete by:  As directed   No lifting for 2 weeks     Resume previous diet    Complete by:  As directed            Discharge Diagnosis:  Peripheral vascular disease with left lower extremity claudication I70.212  Secondary Diagnosis: Patient Active Problem List   Diagnosis Date Noted  . 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 Bilateral leg pain 06/16/2015  . Aftercare following surgery of the circulatory system, [place] 01/01/2014  . Occlusion and stenosis of carotid artery without mention of cerebral infarction 01/01/2013   Past Medical History  Diagnosis Date  . COPD (chronic obstructive pulmonary disease)   . Hypertension   . Hyperlipidemia   . Arrhythmia   . Carotid artery occlusion     with Claudication  . Asthma   . Vertigo   . Diabetes mellitus without complication     Type II  Diet and  Exercise  . Gout Jan. 2016    Right Ankle  . CAD (coronary artery disease)        Medication List    TAKE these medications        amLODipine 10 MG tablet  Commonly known as:  NORVASC  Take 10 mg by mouth daily.     aspirin EC 81 MG tablet  Take 81 mg by mouth daily.     atorvastatin 80 MG tablet  Commonly known as:  LIPITOR  Take 80 mg by mouth every evening.     azelastine 0.1 % nasal spray  Commonly known as:  ASTELIN  Place 1 spray into both nostrils 2 (two) times daily as needed. Use in each nostril as needed for allergy symptoms     clopidogrel 75 MG tablet  Commonly known as:  PLAVIX  Take 75  mg by mouth daily with breakfast.     fenofibrate 145 MG tablet  Commonly known as:  TRICOR  Take 145 mg by mouth at bedtime.     hydrALAZINE 25 MG tablet  Commonly known as:  APRESOLINE  Take 25 mg by mouth 2 (two)  times daily.     Krill Oil Omega-3 300 MG Caps  Take 300 mg by mouth 2 (two) times daily.     lisinopril 40 MG tablet  Commonly known as:  PRINIVIL,ZESTRIL  Take 40 mg by mouth daily.     metoprolol succinate 50 MG 24 hr tablet  Commonly known as:  TOPROL-XL  Take 50 mg by mouth daily. Take with or immediately following a meal.     multivitamin tablet  Take 1 tablet by mouth daily.     NAMENDA XR 28 MG Cp24 24 hr capsule  Generic drug:  memantine  Take 28 mg by mouth daily.     oxyCODONE-acetaminophen 5-325 MG per tablet  Commonly known as:  PERCOCET/ROXICET  Take 1-2 tablets by mouth every 6 (six) hours as needed for moderate pain.     pantoprazole 40 MG tablet  Commonly known as:  PROTONIX  Take 40 mg by mouth daily.     REFRESH 1.4-0.6 % ophthalmic solution  Generic drug:  polyvinyl alcohol-povidone  Place 1-2 drops into both eyes 4 (four) times daily.     tiotropium 18 MCG inhalation capsule  Commonly known as:  SPIRIVA  Place 18 mcg into inhaler and inhale daily.     ULORIC 40 MG tablet  Generic drug:  febuxostat  Take 40 mg by mouth daily.     VIAGRA 100 MG tablet  Generic drug:  sildenafil  Take 100 mg by mouth daily as needed.        Prescriptions given: 1.  Roxicet #30 No Refill  Instructions: 1.  No heavy lifting for 4 weeks 2.  No driving x 2 weeks  Disposition: home  Patient's condition: is Good  Follow up: 1. Dr. [name] in 2 weeks   Leontine Locket, PA-C [facility] [phone] 08/26/2015  10:48 AM  - For VQI Registry use --- Instructions: Press F2 to tab through selections.  Delete question if not applicable.   Post-op:  Wound infection: No  Graft infection: No  Transfusion: No  If yes, n/a  units given New Arrhythmia: No Ipsilateral amputation: No, [ ]  Minor, [ ]  BKA, [ ]  AKA Discharge patency: [x ] Primary, [ ]  Primary assisted, [ ]  Secondary, [ ]  Occluded Patency judged by: [ ]  Dopper only, [ ]  Palpable graft pulse, [ x] Palpable distal pulse, [ ]  ABI inc. > 0.15, [ ]  Duplex Discharge ABI: R 0.77, L 0.88 Discharge TBI: R , L  D/C Ambulatory Status: Ambulatory  Complications: MI: No, [ ]  Troponin only, [ ]  EKG or Clinical CHF: No Resp failure:No, [ ]  Pneumonia, [ ]  Ventilator Chg in renal function: No, [ ]  Inc. Cr > 0.5, [ ]  Temp. Dialysis, [ ]  Permanent dialysis Stroke: No, [ ]  Minor, [ ]  Major Return to OR: No  Reason for return to OR: [ ]  Bleeding, [ ]  Infection, [ ]  Thrombosis, [ ]  Revision  Discharge medications: Statin use:  yes ASA use:  yes Plavix use:  yes Beta blocker use: yes Coumadin use: no ACEI use:  yes

## 2015-08-26 NOTE — Progress Notes (Signed)
Pt/family given discharge instructions, medication lists, follow up appointments, and when to call the doctor.  Pt/family verbalizes understanding. Pt given signs and symptoms of infection. Cindia Hustead McClintock, RN    

## 2015-09-08 DIAGNOSIS — H26493 Other secondary cataract, bilateral: Secondary | ICD-10-CM | POA: Diagnosis not present

## 2015-09-13 ENCOUNTER — Encounter: Payer: Self-pay | Admitting: Vascular Surgery

## 2015-09-15 ENCOUNTER — Encounter: Payer: Self-pay | Admitting: Vascular Surgery

## 2015-09-15 ENCOUNTER — Other Ambulatory Visit: Payer: Self-pay

## 2015-09-15 ENCOUNTER — Ambulatory Visit (INDEPENDENT_AMBULATORY_CARE_PROVIDER_SITE_OTHER): Payer: Medicare Other | Admitting: Vascular Surgery

## 2015-09-15 VITALS — BP 155/78 | HR 62 | Temp 97.9°F | Ht 70.5 in | Wt 220.0 lb

## 2015-09-15 DIAGNOSIS — R221 Localized swelling, mass and lump, neck: Secondary | ICD-10-CM

## 2015-09-15 DIAGNOSIS — I739 Peripheral vascular disease, unspecified: Secondary | ICD-10-CM

## 2015-09-15 NOTE — Progress Notes (Signed)
Patient is a 69 year old male who returns for follow-up today. He recently underwent Femoral to above-knee popliteal bypass with propaten. This was done for claudication. He states his claudication symptoms have resolved. He still has some soreness in his left groin. He also complains of numbness and tingling on the medial aspect of his left calf. He denies incisional drainage. He has had no fever or chills.  He has previously undergone left carotid endarterectomy and complains of a nodule in the central aspect of his left neck incision. He states this is been gradually increasing in size. He denies any drainage from this.  Physical exam:  Filed Vitals:   09/15/15 0825 09/15/15 0828  BP: 154/76 155/78  Pulse: 62   Temp: 97.9 F (36.6 C)   TempSrc: Oral   Height: 5' 10.5" (1.791 m)   Weight: 220 lb (99.791 kg)   SpO2: 100%     Neck: 2 cm nodule subcutaneous in nature with whitish central appearance no drainage no erythema, mobile  Extremities: Left leg healing groin and leg incisions no drainage or erythema 2+ left dorsalis pedis pulse, no palpable pedal pulses right foot  Assessment: #1 doing well status post left femoropopliteal bypass with resolution of claudication symptoms palpable dorsalis pedis pulse and healing incisions                        #2 known peripheral arterial disease right lower extremity with SFA and tibial stenoses but currently asymptomatic with normal ABI                        #3 previous bilateral common iliac artery stents patent                        #4 subcutaneous nodule left neck incision appears to be consistent with a sebaceous cyst no evidence of underlying deep infection. We will schedule him for excision of this nodule on 09/21/2015 as an outpatient procedure.  Plan: See above  Ruta Hinds, MD [facility] [place] Office: [phone] Pager: [phone]

## 2015-09-15 NOTE — Addendum Note (Signed)
Addended by: Dorthula Rue L on: 09/15/2015 10:13 AM   Modules accepted: Orders

## 2015-09-19 DIAGNOSIS — Z1389 Encounter for screening for other disorder: Secondary | ICD-10-CM | POA: Diagnosis not present

## 2015-09-19 DIAGNOSIS — Z139 Encounter for screening, unspecified: Secondary | ICD-10-CM | POA: Diagnosis not present

## 2015-09-19 DIAGNOSIS — M5416 Radiculopathy, lumbar region: Secondary | ICD-10-CM | POA: Diagnosis not present

## 2015-09-19 DIAGNOSIS — I10 Essential (primary) hypertension: Secondary | ICD-10-CM | POA: Diagnosis not present

## 2015-09-19 DIAGNOSIS — N183 Chronic kidney disease, stage 3 (moderate): Secondary | ICD-10-CM | POA: Diagnosis not present

## 2015-09-19 DIAGNOSIS — M5136 Other intervertebral disc degeneration, lumbar region: Secondary | ICD-10-CM | POA: Diagnosis not present

## 2015-09-19 DIAGNOSIS — E785 Hyperlipidemia, unspecified: Secondary | ICD-10-CM | POA: Diagnosis not present

## 2015-09-19 DIAGNOSIS — E1122 Type 2 diabetes mellitus with diabetic chronic kidney disease: Secondary | ICD-10-CM | POA: Diagnosis not present

## 2015-09-19 DIAGNOSIS — Z9181 History of falling: Secondary | ICD-10-CM | POA: Diagnosis not present

## 2015-09-19 DIAGNOSIS — M5137 Other intervertebral disc degeneration, lumbosacral region: Secondary | ICD-10-CM | POA: Diagnosis not present

## 2015-09-19 DIAGNOSIS — M109 Gout, unspecified: Secondary | ICD-10-CM | POA: Diagnosis not present

## 2015-09-20 ENCOUNTER — Encounter (HOSPITAL_COMMUNITY): Payer: Self-pay | Admitting: *Deleted

## 2015-09-20 MED ORDER — CHLORHEXIDINE GLUCONATE CLOTH 2 % EX PADS: 6.0000 | MEDICATED_PAD | Freq: Once | CUTANEOUS | Status: DC

## 2015-09-20 MED ORDER — SODIUM CHLORIDE 0.9 % IV SOLN
INTRAVENOUS | Status: DC
  Administered 2015-09-21: 13:00:00 via INTRAVENOUS

## 2015-09-20 MED ORDER — DEXTROSE 5 % IV SOLN
1.5000 g | INTRAVENOUS | Status: AC
  Administered 2015-09-21: 1.5 g via INTRAVENOUS
  Filled 2015-09-20: qty 1.5

## 2015-09-21 ENCOUNTER — Encounter (HOSPITAL_COMMUNITY)
Admission: RE | Disposition: A | Discharge status: Home / Self Care | Payer: Self-pay | Source: Ambulatory Visit | Attending: Vascular Surgery

## 2015-09-21 ENCOUNTER — Ambulatory Visit (HOSPITAL_COMMUNITY): Payer: Medicare Other | Admitting: Anesthesiology

## 2015-09-21 ENCOUNTER — Ambulatory Visit (HOSPITAL_COMMUNITY)
Admission: RE | Admit: 2015-09-21 | Discharge: 2015-09-21 | Disposition: A | Discharge status: Home / Self Care | Payer: Medicare Other | Source: Ambulatory Visit | Attending: Vascular Surgery | Admitting: Vascular Surgery

## 2015-09-21 ENCOUNTER — Encounter (HOSPITAL_COMMUNITY): Payer: Self-pay | Admitting: *Deleted

## 2015-09-21 DIAGNOSIS — J449 Chronic obstructive pulmonary disease, unspecified: Secondary | ICD-10-CM | POA: Insufficient documentation

## 2015-09-21 DIAGNOSIS — E119 Type 2 diabetes mellitus without complications: Secondary | ICD-10-CM | POA: Insufficient documentation

## 2015-09-21 DIAGNOSIS — N189 Chronic kidney disease, unspecified: Secondary | ICD-10-CM | POA: Diagnosis not present

## 2015-09-21 DIAGNOSIS — I2510 Atherosclerotic heart disease of native coronary artery without angina pectoris: Secondary | ICD-10-CM | POA: Diagnosis not present

## 2015-09-21 DIAGNOSIS — J45909 Unspecified asthma, uncomplicated: Secondary | ICD-10-CM | POA: Diagnosis not present

## 2015-09-21 DIAGNOSIS — I10 Essential (primary) hypertension: Secondary | ICD-10-CM | POA: Insufficient documentation

## 2015-09-21 DIAGNOSIS — L720 Epidermal cyst: Secondary | ICD-10-CM | POA: Diagnosis not present

## 2015-09-21 DIAGNOSIS — Z87891 Personal history of nicotine dependence: Secondary | ICD-10-CM | POA: Insufficient documentation

## 2015-09-21 DIAGNOSIS — R221 Localized swelling, mass and lump, neck: Secondary | ICD-10-CM | POA: Diagnosis not present

## 2015-09-21 DIAGNOSIS — I70201 Unspecified atherosclerosis of native arteries of extremities, right leg: Secondary | ICD-10-CM | POA: Diagnosis not present

## 2015-09-21 HISTORY — DX: Chronic kidney disease, unspecified: N18.9

## 2015-09-21 HISTORY — DX: Unspecified injury of head, initial encounter: S09.90XA

## 2015-09-21 HISTORY — DX: Unspecified osteoarthritis, unspecified site: M19.90

## 2015-09-21 HISTORY — PX: CAROTID BODY TUMOR EXCISION: SHX5156

## 2015-09-21 HISTORY — DX: Respiratory tuberculosis unspecified: A15.9

## 2015-09-21 LAB — POCT I-STAT 4, (NA,K, GLUC, HGB,HCT)
GLUCOSE: 96 mg/dL (ref 65–99)
HEMATOCRIT: 39 % (ref 39.0–52.0)
HEMOGLOBIN: 13.3 g/dL (ref 13.0–17.0)
POTASSIUM: 4.9 mmol/L (ref 3.5–5.1)
SODIUM: 143 mmol/L (ref 135–145)

## 2015-09-21 LAB — GLUCOSE, CAPILLARY: Glucose-Capillary: 100 mg/dL — ABNORMAL HIGH (ref 65–99)

## 2015-09-21 SURGERY — EXCISION, NEOPLASM, CAROTID BODY
Anesthesia: Monitor Anesthesia Care | Site: Neck | Laterality: Left

## 2015-09-21 MED ORDER — PROMETHAZINE HCL 25 MG/ML IJ SOLN: 6.2500 mg | INTRAMUSCULAR | Status: DC | PRN

## 2015-09-21 MED ORDER — FENTANYL CITRATE (PF) 100 MCG/2ML IJ SOLN
INTRAMUSCULAR | Status: DC | PRN
  Administered 2015-09-21: 50 ug via INTRAVENOUS

## 2015-09-21 MED ORDER — LIDOCAINE HCL (CARDIAC) 20 MG/ML IV SOLN
INTRAVENOUS | Status: AC
  Filled 2015-09-21: qty 5

## 2015-09-21 MED ORDER — ROCURONIUM BROMIDE 50 MG/5ML IV SOLN
INTRAVENOUS | Status: AC
  Filled 2015-09-21: qty 1

## 2015-09-21 MED ORDER — ONDANSETRON HCL 4 MG/2ML IJ SOLN
INTRAMUSCULAR | Status: DC | PRN
  Administered 2015-09-21: 4 mg via INTRAVENOUS

## 2015-09-21 MED ORDER — MEPERIDINE HCL 25 MG/ML IJ SOLN: 6.2500 mg | INTRAMUSCULAR | Status: DC | PRN

## 2015-09-21 MED ORDER — FENTANYL CITRATE (PF) 100 MCG/2ML IJ SOLN: 25.0000 ug | INTRAMUSCULAR | Status: DC | PRN

## 2015-09-21 MED ORDER — ONDANSETRON HCL 4 MG/2ML IJ SOLN
INTRAMUSCULAR | Status: AC
  Filled 2015-09-21: qty 2

## 2015-09-21 MED ORDER — LIDOCAINE-EPINEPHRINE (PF) 1 %-1:200000 IJ SOLN
INTRAMUSCULAR | Status: DC | PRN
  Administered 2015-09-21: 8 mL

## 2015-09-21 MED ORDER — PROPOFOL 10 MG/ML IV BOLUS
INTRAVENOUS | Status: AC
  Filled 2015-09-21: qty 20

## 2015-09-21 MED ORDER — SODIUM CHLORIDE 0.9 % IV SOLN: INTRAVENOUS | Status: DC

## 2015-09-21 MED ORDER — ACETAMINOPHEN-CODEINE #3 300-30 MG PO TABS: 1.0000 | ORAL_TABLET | ORAL | Status: DC | PRN

## 2015-09-21 MED ORDER — MIDAZOLAM HCL 5 MG/5ML IJ SOLN
INTRAMUSCULAR | Status: DC | PRN
  Administered 2015-09-21: 2 mg via INTRAVENOUS

## 2015-09-21 MED ORDER — BUPIVACAINE HCL (PF) 0.25 % IJ SOLN
INTRAMUSCULAR | Status: AC
  Filled 2015-09-21: qty 30

## 2015-09-21 MED ORDER — ACETAMINOPHEN-CODEINE #2 300-15 MG PO TABS: 1.0000 | ORAL_TABLET | ORAL | Status: DC | PRN

## 2015-09-21 MED ORDER — MIDAZOLAM HCL 2 MG/2ML IJ SOLN
INTRAMUSCULAR | Status: AC
  Filled 2015-09-21: qty 4

## 2015-09-21 MED ORDER — FENTANYL CITRATE (PF) 250 MCG/5ML IJ SOLN
INTRAMUSCULAR | Status: AC
  Filled 2015-09-21: qty 5

## 2015-09-21 MED ORDER — PROPOFOL 500 MG/50ML IV EMUL
INTRAVENOUS | Status: DC | PRN
  Administered 2015-09-21: 75 ug/kg/min via INTRAVENOUS

## 2015-09-21 MED ORDER — 0.9 % SODIUM CHLORIDE (POUR BTL) OPTIME
TOPICAL | Status: DC | PRN
  Administered 2015-09-21: 2000 mL

## 2015-09-21 MED ORDER — LIDOCAINE-EPINEPHRINE (PF) 1 %-1:200000 IJ SOLN
INTRAMUSCULAR | Status: AC
  Filled 2015-09-21: qty 30

## 2015-09-21 MED ORDER — BUPIVACAINE HCL 0.25 % IJ SOLN
INTRAMUSCULAR | Status: DC | PRN
  Administered 2015-09-21: 8 mL

## 2015-09-21 MED ORDER — THROMBIN 20000 UNITS EX SOLR
CUTANEOUS | Status: AC
  Filled 2015-09-21: qty 20000

## 2015-09-21 MED ORDER — LIDOCAINE HCL (CARDIAC) 20 MG/ML IV SOLN
INTRAVENOUS | Status: DC | PRN
  Administered 2015-09-21: 100 mg via INTRAVENOUS

## 2015-09-21 SURGICAL SUPPLY — 39 items
CANISTER SUCTION 2500CC (MISCELLANEOUS) ×2 IMPLANT
CANNULA VESSEL 3MM 2 BLNT TIP (CANNULA) ×2 IMPLANT
CATH ROBINSON RED A/P 18FR (CATHETERS) ×2 IMPLANT
CLIP TI MEDIUM 6 (CLIP) ×2 IMPLANT
CLIP TI WIDE RED SMALL 6 (CLIP) ×2 IMPLANT
CRADLE DONUT ADULT HEAD (MISCELLANEOUS) ×1 IMPLANT
DECANTER SPIKE VIAL GLASS SM (MISCELLANEOUS) IMPLANT
DRAIN HEMOVAC 1/8 X 5 (WOUND CARE) IMPLANT
ELECT REM PT RETURN 9FT ADLT (ELECTROSURGICAL) ×2
ELECTRODE REM PT RTRN 9FT ADLT (ELECTROSURGICAL) ×1 IMPLANT
EVACUATOR SILICONE 100CC (DRAIN) IMPLANT
GAUZE SPONGE 4X4 12PLY STRL (GAUZE/BANDAGES/DRESSINGS) ×2 IMPLANT
GEL ULTRASOUND 20GR AQUASONIC (MISCELLANEOUS) IMPLANT
GLOVE BIO SURGEON STRL SZ7.5 (GLOVE) ×2 IMPLANT
GOWN STRL REUS W/ TWL LRG LVL3 (GOWN DISPOSABLE) ×3 IMPLANT
GOWN STRL REUS W/TWL LRG LVL3 (GOWN DISPOSABLE) ×6
KIT BASIN OR (CUSTOM PROCEDURE TRAY) ×2 IMPLANT
KIT ROOM TURNOVER OR (KITS) ×2 IMPLANT
LIQUID BAND (GAUZE/BANDAGES/DRESSINGS) ×2 IMPLANT
LOOP VESSEL MINI RED (MISCELLANEOUS) IMPLANT
NDL HYPO 25GX1X1/2 BEV (NEEDLE) IMPLANT
NEEDLE HYPO 25GX1X1/2 BEV (NEEDLE) IMPLANT
NS IRRIG 1000ML POUR BTL (IV SOLUTION) ×4 IMPLANT
PACK CAROTID (CUSTOM PROCEDURE TRAY) ×2 IMPLANT
PAD ARMBOARD 7.5X6 YLW CONV (MISCELLANEOUS) ×4 IMPLANT
SHUNT CAROTID BYPASS 10 (VASCULAR PRODUCTS) IMPLANT
SHUNT CAROTID BYPASS 12FRX15.5 (VASCULAR PRODUCTS) IMPLANT
SPONGE INTESTINAL PEANUT (DISPOSABLE) ×2 IMPLANT
SPONGE SURGIFOAM ABS GEL 100 (HEMOSTASIS) IMPLANT
SUT ETHILON 3 0 PS 1 (SUTURE) IMPLANT
SUT PROLENE 6 0 CC (SUTURE) ×2 IMPLANT
SUT PROLENE 7 0 BV 1 (SUTURE) IMPLANT
SUT SILK 3 0 TIES 17X18 (SUTURE)
SUT SILK 3-0 18XBRD TIE BLK (SUTURE) IMPLANT
SUT VIC AB 3-0 SH 27 (SUTURE) ×2
SUT VIC AB 3-0 SH 27X BRD (SUTURE) ×1 IMPLANT
SUT VICRYL 4-0 PS2 18IN ABS (SUTURE) ×2 IMPLANT
SYR CONTROL 10ML LL (SYRINGE) IMPLANT
WATER STERILE IRR 1000ML POUR (IV SOLUTION) ×2 IMPLANT

## 2015-09-21 NOTE — Anesthesia Postprocedure Evaluation (Signed)
  Anesthesia Post-op Note  Patient: Joseph Hebert  Procedure(s) Performed: Procedure(s): EXCISE LEFT NECK NODULE WITH LOCAL  (Left)  Patient Location: PACU  Anesthesia Type:MAC  Level of Consciousness: awake  Airway and Oxygen Therapy: Patient Spontanous Breathing  Post-op Pain: none  Post-op Assessment: Post-op Vital signs reviewed, Patient's Cardiovascular Status Stable, Respiratory Function Stable, Patent Airway and No signs of Nausea or vomiting              Post-op Vital Signs: Reviewed and stable  Last Vitals:  Filed Vitals:   09/21/15 1444  BP: 138/60  Pulse: 54  Temp:   Resp: 11    Complications: No apparent anesthesia complications

## 2015-09-21 NOTE — Anesthesia Preprocedure Evaluation (Addendum)
Anesthesia Evaluation  Patient identified by MRN, date of birth, ID band Patient awake    Reviewed: Allergy & Precautions, NPO status , Patient's Chart, lab work & pertinent test results, reviewed documented beta blocker date and time   Airway Mallampati: II       Dental  (+) Dental Advisory Given   Pulmonary asthma , COPD,  COPD inhaler, former smoker,    breath sounds clear to auscultation       Cardiovascular hypertension, Pt. on medications + CAD and + Peripheral Vascular Disease  + dysrhythmias  Rhythm:Regular  Normal Stress with normal EF 07/2015   Neuro/Psych R carotid 59% stenosis, L clear    GI/Hepatic   Endo/Other  diabetes, Type 2  Renal/GU Renal InsufficiencyRenal disease     Musculoskeletal   Abdominal (+)  Abdomen: soft.    Peds  Hematology   Anesthesia Other Findings   Reproductive/Obstetrics                            Anesthesia Physical Anesthesia Plan  ASA: III  Anesthesia Plan: MAC   Post-op Pain Management:    Induction: Intravenous  Airway Management Planned: Nasal Cannula  Additional Equipment:   Intra-op Plan:   Post-operative Plan: Extubation in OR  Informed Consent: I have reviewed the patients History and Physical, chart, labs and discussed the procedure including the risks, benefits and alternatives for the proposed anesthesia with the patient or authorized representative who has indicated his/her understanding and acceptance.     Plan Discussed with:   Anesthesia Plan Comments: (Posted as MAC, could do GA if wanted or needed)        Anesthesia Quick Evaluation

## 2015-09-21 NOTE — Interval H&P Note (Signed)
History and Physical Interval Note:  09/21/2015 1:14 PM  Joseph Hebert  has presented today for surgery, with the diagnosis of Subcutaneous nodule left neck R22.1  The various methods of treatment have been discussed with the patient and family. After consideration of risks, benefits and other options for treatment, the patient has consented to  Procedure(s): EXCISE NECK NODULE WITH LOCAL  (Left) as a surgical intervention .  The patient's history has been reviewed, patient examined, no change in status, stable for surgery.  I have reviewed the patient's chart and labs.  Questions were answered to the patient's satisfaction.     Ruta Hinds

## 2015-09-21 NOTE — Progress Notes (Signed)
Dr Tresa Moore called about what labs he wants to get today. Order for I stat noted. Dr Tresa Moore states he is ok with that.

## 2015-09-21 NOTE — Anesthesia Procedure Notes (Signed)
Procedure Name: MAC Date/Time: 09/21/2015 1:45 PM Performed by: Jenne Campus Pre-anesthesia Checklist: Patient identified, Emergency Drugs available, Suction available, Patient being monitored and Timeout performed Patient Re-evaluated:Patient Re-evaluated prior to inductionOxygen Delivery Method: Nasal cannula

## 2015-09-21 NOTE — H&P (View-Only) (Signed)
Patient is a 69 year old male who returns for follow-up today. He recently underwent Femoral to above-knee popliteal bypass with propaten. This was done for claudication. He states his claudication symptoms have resolved. He still has some soreness in his left groin. He also complains of numbness and tingling on the medial aspect of his left calf. He denies incisional drainage. He has had no fever or chills.  He has previously undergone left carotid endarterectomy and complains of a nodule in the central aspect of his left neck incision. He states this is been gradually increasing in size. He denies any drainage from this.  Physical exam:  Filed Vitals:   09/15/15 0825 09/15/15 0828  BP: 154/76 155/78  Pulse: 62   Temp: 97.9 F (36.6 C)   TempSrc: Oral   Height: 5' 10.5" (1.791 m)   Weight: 220 lb (99.791 kg)   SpO2: 100%     Neck: 2 cm nodule subcutaneous in nature with whitish central appearance no drainage no erythema, mobile  Extremities: Left leg healing groin and leg incisions no drainage or erythema 2+ left dorsalis pedis pulse, no palpable pedal pulses right foot  Assessment: #1 doing well status post left femoropopliteal bypass with resolution of claudication symptoms palpable dorsalis pedis pulse and healing incisions                        #2 known peripheral arterial disease right lower extremity with SFA and tibial stenoses but currently asymptomatic with normal ABI                        #3 previous bilateral common iliac artery stents patent                        #4 subcutaneous nodule left neck incision appears to be consistent with a sebaceous cyst no evidence of underlying deep infection. We will schedule him for excision of this nodule on 09/21/2015 as an outpatient procedure.  Plan: See above  Ruta Hinds, MD [facility] [place] Office: [phone] Pager: [phone]

## 2015-09-21 NOTE — Transfer of Care (Signed)
Immediate Anesthesia Transfer of Care Note  Patient: Joseph Hebert  Procedure(s) Performed: Procedure(s): EXCISE LEFT NECK NODULE WITH LOCAL  (Left)  Patient Location: PACU  Anesthesia Type:MAC  Level of Consciousness: awake, oriented and patient cooperative  Airway & Oxygen Therapy: Patient Spontanous Breathing and Patient connected to nasal cannula oxygen  Post-op Assessment: Report given to RN and Post -op Vital signs reviewed and stable  Post vital signs: Reviewed  Last Vitals:  Filed Vitals:   09/21/15 1105  BP: 159/70  Pulse: 63  Temp: 36.4 C  Resp: 18    Complications: No apparent anesthesia complications

## 2015-09-21 NOTE — Op Note (Signed)
Procedure: Excisional biopsy left neck nodule he  Preoperative diagnosis: Left neck nodule  Postoperative diagnosis: Same  Anesthesia: Local with IV sedation  Specimens: Left neck nodule and surrounding skin  Operative details: After obtaining informed consent, the patient was taken to the operating room. The patient was placed supine position on operating room table. After adequate sedation the patient's entire left neck and chest were prepped and draped in usual sterile fashion. Local anesthesia was infiltrated around a pre-existing left neck nodule. An ellipse of skin was raised after incising this area. Incision was deepened through the subcutaneous tissues and the ellipse of skin and the central neck nodule removed en bloc. This was sent to pathology as specimen. There is no surrounding purulent material. The neck nodule did not penetrate below the subcutaneous tissues. The wound was irrigated with normal saline solution. Hemostasis was obtained. The subcutaneous tissues were reapproximated using a running 3-0 Vicryl suture. The skin was closed with a 4-0 Vicryl subcuticular stitch. Patient tolerated the procedure well and there were no complications. Instrument sponge and needle count was correct in the case. The patient was taken to recovery room in stable condition.  Ruta Hinds, MD [facility] [place] Office: [phone] Pager: [phone]

## 2015-09-22 ENCOUNTER — Encounter (HOSPITAL_COMMUNITY): Payer: Self-pay | Admitting: Vascular Surgery

## 2015-09-22 ENCOUNTER — Telehealth: Payer: Self-pay | Admitting: Vascular Surgery

## 2015-09-22 NOTE — Telephone Encounter (Addendum)
-----   Message from Mena Goes, RN sent at 09/21/2015  2:42 PM EDT ----- Regarding: Schedule   ----- Message -----    From: [name], MD    Sent: 09/21/2015   2:21 PM      To: [facility] Charge Pool  Excision left neck nodule No asst  Follow up with me in 2 weeks  Ruta Hinds  notified patient of post op appt. with dr. [name] on 10-05-15 at [facility]

## 2015-09-23 DIAGNOSIS — N183 Chronic kidney disease, stage 3 (moderate): Secondary | ICD-10-CM | POA: Diagnosis not present

## 2015-09-27 DIAGNOSIS — N189 Chronic kidney disease, unspecified: Secondary | ICD-10-CM | POA: Diagnosis not present

## 2015-09-27 DIAGNOSIS — N183 Chronic kidney disease, stage 3 (moderate): Secondary | ICD-10-CM | POA: Diagnosis not present

## 2015-09-27 DIAGNOSIS — Q6102 Congenital multiple renal cysts: Secondary | ICD-10-CM | POA: Diagnosis not present

## 2015-09-28 DIAGNOSIS — M4806 Spinal stenosis, lumbar region: Secondary | ICD-10-CM | POA: Diagnosis not present

## 2015-09-28 DIAGNOSIS — M5137 Other intervertebral disc degeneration, lumbosacral region: Secondary | ICD-10-CM | POA: Diagnosis not present

## 2015-09-28 DIAGNOSIS — M4807 Spinal stenosis, lumbosacral region: Secondary | ICD-10-CM | POA: Diagnosis not present

## 2015-09-28 DIAGNOSIS — M5126 Other intervertebral disc displacement, lumbar region: Secondary | ICD-10-CM | POA: Diagnosis not present

## 2015-10-03 DIAGNOSIS — D235 Other benign neoplasm of skin of trunk: Secondary | ICD-10-CM | POA: Diagnosis not present

## 2015-10-03 DIAGNOSIS — L578 Other skin changes due to chronic exposure to nonionizing radiation: Secondary | ICD-10-CM | POA: Diagnosis not present

## 2015-10-03 DIAGNOSIS — D485 Neoplasm of uncertain behavior of skin: Secondary | ICD-10-CM | POA: Diagnosis not present

## 2015-10-03 DIAGNOSIS — L821 Other seborrheic keratosis: Secondary | ICD-10-CM | POA: Diagnosis not present

## 2015-10-03 DIAGNOSIS — L219 Seborrheic dermatitis, unspecified: Secondary | ICD-10-CM | POA: Diagnosis not present

## 2015-10-04 ENCOUNTER — Encounter: Payer: Self-pay | Admitting: Vascular Surgery

## 2015-10-05 ENCOUNTER — Encounter: Payer: Self-pay | Admitting: Vascular Surgery

## 2015-10-05 ENCOUNTER — Ambulatory Visit (INDEPENDENT_AMBULATORY_CARE_PROVIDER_SITE_OTHER): Payer: Medicare Other | Admitting: Vascular Surgery

## 2015-10-05 VITALS — BP 166/89 | HR 68 | Temp 97.7°F | Resp 16 | Ht 70.5 in | Wt 219.0 lb

## 2015-10-05 DIAGNOSIS — I739 Peripheral vascular disease, unspecified: Secondary | ICD-10-CM

## 2015-10-05 NOTE — Progress Notes (Signed)
Filed Vitals:   10/05/15 0905 10/05/15 0910 10/05/15 0912 10/05/15 0913  BP: 157/81 153/88 158/86 166/89  Pulse: 65 68 66 68  Temp: 97.7 F (36.5 C)     TempSrc: Oral     Resp: 16     Height: 5' 10.5" (1.791 m)     Weight: 219 lb (99.338 kg)     SpO2: 98%

## 2015-10-05 NOTE — Progress Notes (Signed)
Patient is a 69 year old male who returns for follow-up today. He recently underwent Femoral to above-knee popliteal bypass with propaten August 23 2015. This was done for claudication. He states his claudication symptoms have resolved. He still has some soreness in his left groin. He also complains of numbness and tingling on the medial aspect of his left calf. He denies incisional drainage. He has had no fever or chills.  He has previously undergone left carotid endarterectomy and complained of a nodule in the central aspect of his left neck incision. This was excised 2 weeks ago. Pathology showed inclusion cyst. He denies any incisional drainage from this.  Physical exam:     Filed Vitals:   10/05/15 0905 10/05/15 0910 10/05/15 0912 10/05/15 0913  BP: 157/81 153/88 158/86 166/89  Pulse: 65 68 66 68  Temp: 97.7 F (36.5 C)     TempSrc: Oral     Resp: 16     Height: 5' 10.5" (1.791 m)     Weight: 219 lb (99.338 kg)     SpO2: 98%       Neck: Well-healed left neck incision no drainage no erythema  Extremities: Left leg healing groin and leg incisions no drainage or erythema 2+ left dorsalis pedis pulse, no palpable pedal pulses right foot  Assessment: #1 doing well status post left femoropopliteal bypass with resolution of claudication symptoms palpable dorsalis pedis pulse and healing incisions.  Patient will have a graft duplex and follow-up ABIs in December 2016.                         #2 known peripheral arterial disease right lower extremity with SFA and tibial stenoses but currently asymptomatic with normal ABI                         #3 previous bilateral common iliac artery stents patent                         #4 subcutaneous nodule left neck incision sebaceous cyst resolved, recent left carotid endarterectomy will have follow-up carotid duplex scan in December 2016. Plan: See above  Ruta Hinds, MD [facility] [place] Office: [phone] Pager:  [phone]

## 2015-10-11 NOTE — Addendum Note (Signed)
Addended by: Dorthula Rue L on: 10/11/2015 03:28 PM   Modules accepted: Orders

## 2015-10-13 DIAGNOSIS — D485 Neoplasm of uncertain behavior of skin: Secondary | ICD-10-CM | POA: Diagnosis not present

## 2015-10-21 DIAGNOSIS — Z6831 Body mass index (BMI) 31.0-31.9, adult: Secondary | ICD-10-CM | POA: Diagnosis not present

## 2015-10-21 DIAGNOSIS — I10 Essential (primary) hypertension: Secondary | ICD-10-CM | POA: Diagnosis not present

## 2015-10-21 DIAGNOSIS — M5416 Radiculopathy, lumbar region: Secondary | ICD-10-CM | POA: Diagnosis not present

## 2015-11-18 DIAGNOSIS — I10 Essential (primary) hypertension: Secondary | ICD-10-CM | POA: Diagnosis not present

## 2015-11-18 DIAGNOSIS — Z6831 Body mass index (BMI) 31.0-31.9, adult: Secondary | ICD-10-CM | POA: Diagnosis not present

## 2015-11-18 DIAGNOSIS — M5416 Radiculopathy, lumbar region: Secondary | ICD-10-CM | POA: Diagnosis not present

## 2015-12-02 ENCOUNTER — Encounter: Payer: Self-pay | Admitting: Vascular Surgery

## 2015-12-06 ENCOUNTER — Ambulatory Visit (INDEPENDENT_AMBULATORY_CARE_PROVIDER_SITE_OTHER)
Admission: RE | Admit: 2015-12-06 | Discharge: 2015-12-06 | Disposition: A | Discharge status: Home / Self Care | Payer: Medicare Other | Source: Ambulatory Visit | Attending: Family | Admitting: Family

## 2015-12-06 ENCOUNTER — Ambulatory Visit (HOSPITAL_COMMUNITY)
Admission: RE | Admit: 2015-12-06 | Discharge: 2015-12-06 | Disposition: A | Discharge status: Home / Self Care | Payer: Medicare Other | Source: Ambulatory Visit | Attending: Vascular Surgery | Admitting: Vascular Surgery

## 2015-12-06 ENCOUNTER — Ambulatory Visit (INDEPENDENT_AMBULATORY_CARE_PROVIDER_SITE_OTHER)
Admission: RE | Admit: 2015-12-06 | Discharge: 2015-12-06 | Disposition: A | Discharge status: Home / Self Care | Payer: Medicare Other | Source: Ambulatory Visit | Attending: Vascular Surgery | Admitting: Vascular Surgery

## 2015-12-06 DIAGNOSIS — R221 Localized swelling, mass and lump, neck: Secondary | ICD-10-CM

## 2015-12-06 DIAGNOSIS — Z720 Tobacco use: Secondary | ICD-10-CM

## 2015-12-06 DIAGNOSIS — I739 Peripheral vascular disease, unspecified: Secondary | ICD-10-CM

## 2015-12-06 DIAGNOSIS — Z48812 Encounter for surgical aftercare following surgery on the circulatory system: Secondary | ICD-10-CM

## 2015-12-06 DIAGNOSIS — F17200 Nicotine dependence, unspecified, uncomplicated: Secondary | ICD-10-CM

## 2015-12-08 ENCOUNTER — Encounter: Payer: Self-pay | Admitting: Vascular Surgery

## 2015-12-08 ENCOUNTER — Ambulatory Visit (INDEPENDENT_AMBULATORY_CARE_PROVIDER_SITE_OTHER): Payer: Self-pay | Admitting: Vascular Surgery

## 2015-12-08 VITALS — BP 169/85 | HR 60 | Ht 70.5 in | Wt 226.0 lb

## 2015-12-08 DIAGNOSIS — I739 Peripheral vascular disease, unspecified: Secondary | ICD-10-CM

## 2015-12-08 DIAGNOSIS — I6523 Occlusion and stenosis of bilateral carotid arteries: Secondary | ICD-10-CM

## 2015-12-08 NOTE — Progress Notes (Signed)
[facility] [place]  Patient name: Joseph Hebert MRN: [id number] DOB: 15-Apr-1946 Sex: male  REASON FOR VISIT: Follow-up  HPI: Joseph Hebert is a 69 y.o. male who returns for follow-up today. He previously underwent left femoral to above-knee popliteal bypass with propaten on 08/23/2015. He also previously went left carotid endarterectomy on 10/31/2010. Back in October of this year, he had a nodule excised from his left neck incision. This was found to be a sebaceous cyst.   Today he complains of numbness in his lower left leg. He denies any numbness in his left foot. He also complains of persistent swelling of his left leg. Elevation makes this better. He is not wearing compression stockings. He is not very active. He ambulates a few yards before having to rest secondary to generalized weakness.    Past Medical History  Diagnosis Date  . COPD (chronic obstructive pulmonary disease) ([place])   . Hypertension   . Hyperlipidemia   . Arrhythmia   . Carotid artery occlusion     with Claudication  . Asthma   . Vertigo   . Gout Jan. 2016    Right Ankle  . CAD (coronary artery disease)   . Head injury     as a child  . Diabetes mellitus without complication (HCC)     Type II  Diet and  Exercise  . Tuberculosis 2006     9 months   . Chronic kidney disease     'Numbers increasing"   . Arthritis     Family History  Problem Relation Age of Onset  . Diabetes Mother   . Hyperlipidemia Father   . Hypertension Father   . Other Father     Right Leg Amputation  . Heart disease Sister     Aneyrism     SOCIAL HISTORY: Social History  Substance Use Topics  . Smoking status: Former Smoker -- 0.30 packs/day    Types: Cigarettes    Quit date: 06/23/2015  . Smokeless tobacco: Never Used     Comment: pt stating that he trying his best to quit  . Alcohol Use: No    No Known Allergies  Current Outpatient Prescriptions  Medication Sig Dispense Refill  .  acetaminophen-codeine (TYLENOL #2) 300-15 MG tablet Take 1 tablet by mouth every 4 (four) hours as needed for moderate pain. 10 tablet 0  . amLODipine (NORVASC) 10 MG tablet Take 10 mg by mouth daily.    [name] aspirin EC 81 MG tablet Take 81 mg by mouth daily.    [name] atorvastatin (LIPITOR) 80 MG tablet Take 80 mg by mouth every evening.     [name] azelastine (ASTELIN) 137 MCG/SPRAY nasal spray Place 1 spray into both nostrils 2 (two) times daily as needed. Use in each nostril as needed for allergy symptoms    . clopidogrel (PLAVIX) 75 MG tablet Take 75 mg by mouth daily with breakfast.    . fenofibrate (TRICOR) 145 MG tablet Take 145 mg by mouth at bedtime.     . hydrALAZINE (APRESOLINE) 25 MG tablet Take 25 mg by mouth 2 (two) times daily.  0  . Krill Oil Omega-3 300 MG CAPS Take 300 mg by mouth 2 (two) times daily.    [name] lisinopril (PRINIVIL,ZESTRIL) 40 MG tablet Take 40 mg by mouth daily.    . memantine (NAMENDA XR) 28 MG CP24 24 hr capsule Take 28 mg by mouth daily.    . metoprolol succinate (TOPROL-XL) 50 MG  24 hr tablet Take 50 mg by mouth daily. Take with or immediately following a meal.    . Multiple Vitamin (MULTIVITAMIN) tablet Take 1 tablet by mouth daily.    . pantoprazole (PROTONIX) 40 MG tablet Take 40 mg by mouth daily.  12  . polyvinyl alcohol-povidone (REFRESH) 1.4-0.6 % ophthalmic solution Place 1-2 drops into both eyes 4 (four) times daily.    [name] tiotropium (SPIRIVA) 18 MCG inhalation capsule Place 18 mcg into inhaler and inhale daily.    [name] ULORIC 40 MG tablet Take 40 mg by mouth daily.  2  . VIAGRA 100 MG tablet Take 100 mg by mouth daily as needed.  5   No current facility-administered medications for this visit.    REVIEW OF SYSTEMS:  [X]  denotes positive finding, [ ]  denotes negative finding Cardiac  Comments:  Chest pain or chest pressure:    Shortness of breath upon exertion:    Short of breath when lying flat:    Irregular heart rhythm:        Vascular    Pain in calf,  thigh, or hip brought on by ambulation: x   Pain in feet at night that wakes you up from your sleep:     Blood clot in your veins:    Leg swelling:  x       Pulmonary    Oxygen at home:    Productive cough:     Wheezing:         Neurologic    Sudden weakness in arms or legs:     Sudden numbness in arms or legs:     Sudden onset of difficulty speaking or slurred speech:    Temporary loss of vision in one eye:     Problems with dizziness:         Gastrointestinal    Blood in stool:     Vomited blood:         Genitourinary    Burning when urinating:     Blood in urine:        Psychiatric    Major depression:         Hematologic    Bleeding problems:    Problems with blood clotting too easily:        Skin    Rashes or ulcers:        Constitutional    Fever or chills:      PHYSICAL EXAM: Filed Vitals:   12/08/15 1254 12/08/15 1258  BP: 161/84 169/85  Pulse: 60   Height: 5' 10.5" (1.791 m)   Weight: 226 lb (102.513 kg)   SpO2: 98%     GENERAL: The patient is a well-nourished male, in no acute distress. The vital signs are documented above. VASCULAR: 2+ dorsalis pedis pulses palpable bilaterally. PULMONARY: Non labored breathing MUSCULOSKELETAL: Left leg swelling. Left leg approximately 20% larger than right. NEUROLOGIC: No focal weakness or paresthesias are detected. SKIN: There are no ulcers or rashes noted. PSYCHIATRIC: The patient has a normal affect.  DATA:  ABIs (12/06/15) Right: 0.99 with biphasic and triphasic PT and DP waveforms, TBI 0.76 Left: 1.1 with triphasic PT and DP waveforms, TBI: 0.81  Lower extremity arterial duplex (12/06/2015) Patent left femoral to above-knee artery bypass  Carotid duplex (12/06/2015)  40-59% right internal carotid artery stenosis Patent left carotid endarterectomy site   MEDICAL ISSUES: Peripheral vascular disease S/p left femoral to above-knee popliteal bypass with propaten 08/23/2015 His bypass is patent today on  duplex  studies. His ABIs are normal. He has palpable dorsalis pedis pulses. Encouraged him to be as active as possible. He still has some swelling postoperatively. Advised him to wear 20-30 mmHg compression stockings. He will follow-up in 3 months with a bypass graft duplex and ABIs.  Carotid artery disease Left carotid endarterectomy site is patent. Right internal carotid artery with 40-59% stenosis. He has been asymptomatic. He is on maximal medical management with antihypertensive medications, aspirin, Plavix, and a statin. Follow-up in 1 year with carotid duplex.  Alvia Grove, PA-C [facility] [place]     History and exam findings as above. Patient primarily complains of swelling in his left lower extremity as well as some numbness consistent with saphenous nerve neuropraxia. Reassured him that hopefully this will improve somewhat time. He will wear a compression stocking in the meanwhile to improve the swelling. Bypass graft was patent and no evidence of recurrent carotid disease. He will follow-up in 3 months for graft duplex scan and in one year for carotid duplex scan  Ruta Hinds, MD [facility] [place]: [phone] Pager: [phone]

## 2015-12-09 NOTE — Addendum Note (Signed)
Addended by: Dorthula Rue L on: 12/09/2015 04:18 PM   Modules accepted: Orders

## 2015-12-22 ENCOUNTER — Encounter (HOSPITAL_COMMUNITY): Payer: Medicare Other

## 2015-12-22 ENCOUNTER — Other Ambulatory Visit (HOSPITAL_COMMUNITY): Payer: Medicare Other

## 2015-12-22 ENCOUNTER — Ambulatory Visit: Payer: Medicare Other | Admitting: Vascular Surgery

## 2016-01-13 DIAGNOSIS — M5416 Radiculopathy, lumbar region: Secondary | ICD-10-CM | POA: Diagnosis not present

## 2016-01-17 ENCOUNTER — Other Ambulatory Visit: Payer: Self-pay | Admitting: Neurosurgery

## 2016-01-17 DIAGNOSIS — M5416 Radiculopathy, lumbar region: Secondary | ICD-10-CM

## 2016-01-23 ENCOUNTER — Other Ambulatory Visit: Payer: Self-pay

## 2016-01-23 ENCOUNTER — Ambulatory Visit
Admission: RE | Admit: 2016-01-23 | Discharge: 2016-01-23 | Disposition: A | Discharge status: Home / Self Care | Payer: Medicare Other | Source: Ambulatory Visit | Attending: Neurosurgery | Admitting: Neurosurgery

## 2016-01-23 DIAGNOSIS — M5416 Radiculopathy, lumbar region: Secondary | ICD-10-CM

## 2016-01-23 DIAGNOSIS — M4806 Spinal stenosis, lumbar region: Secondary | ICD-10-CM | POA: Diagnosis not present

## 2016-01-23 MED ORDER — ONDANSETRON HCL 4 MG/2ML IJ SOLN
4.0000 mg | Freq: Once | INTRAMUSCULAR | Status: AC
  Administered 2016-01-23: 4 mg via INTRAMUSCULAR

## 2016-01-23 MED ORDER — IOHEXOL 180 MG/ML  SOLN
15.0000 mL | Freq: Once | INTRAMUSCULAR | Status: AC | PRN
  Administered 2016-01-23: 15 mL via INTRATHECAL

## 2016-01-23 MED ORDER — DIAZEPAM 5 MG PO TABS
5.0000 mg | ORAL_TABLET | Freq: Once | ORAL | Status: AC
  Administered 2016-01-23: 5 mg via ORAL

## 2016-01-23 MED ORDER — MEPERIDINE HCL 100 MG/ML IJ SOLN
75.0000 mg | Freq: Once | INTRAMUSCULAR | Status: AC
  Administered 2016-01-23: 75 mg via INTRAMUSCULAR

## 2016-01-23 NOTE — Discharge Instructions (Signed)

## 2016-01-27 ENCOUNTER — Other Ambulatory Visit (HOSPITAL_COMMUNITY): Payer: Self-pay | Admitting: Neurosurgery

## 2016-01-27 DIAGNOSIS — M5416 Radiculopathy, lumbar region: Secondary | ICD-10-CM | POA: Diagnosis not present

## 2016-01-27 DIAGNOSIS — I10 Essential (primary) hypertension: Secondary | ICD-10-CM | POA: Diagnosis not present

## 2016-01-27 DIAGNOSIS — Z6832 Body mass index (BMI) 32.0-32.9, adult: Secondary | ICD-10-CM | POA: Diagnosis not present

## 2016-02-02 ENCOUNTER — Encounter (HOSPITAL_COMMUNITY): Payer: Medicare Other

## 2016-02-02 ENCOUNTER — Ambulatory Visit: Payer: Medicare Other | Admitting: Family

## 2016-02-08 DIAGNOSIS — E669 Obesity, unspecified: Secondary | ICD-10-CM | POA: Diagnosis not present

## 2016-02-08 DIAGNOSIS — Z6832 Body mass index (BMI) 32.0-32.9, adult: Secondary | ICD-10-CM | POA: Diagnosis not present

## 2016-02-08 DIAGNOSIS — G309 Alzheimer's disease, unspecified: Secondary | ICD-10-CM | POA: Diagnosis not present

## 2016-02-08 DIAGNOSIS — M159 Polyosteoarthritis, unspecified: Secondary | ICD-10-CM | POA: Diagnosis not present

## 2016-02-08 DIAGNOSIS — N183 Chronic kidney disease, stage 3 (moderate): Secondary | ICD-10-CM | POA: Diagnosis not present

## 2016-02-08 DIAGNOSIS — I10 Essential (primary) hypertension: Secondary | ICD-10-CM | POA: Diagnosis not present

## 2016-02-08 DIAGNOSIS — E1122 Type 2 diabetes mellitus with diabetic chronic kidney disease: Secondary | ICD-10-CM | POA: Diagnosis not present

## 2016-02-08 DIAGNOSIS — E785 Hyperlipidemia, unspecified: Secondary | ICD-10-CM | POA: Diagnosis not present

## 2016-02-08 DIAGNOSIS — J309 Allergic rhinitis, unspecified: Secondary | ICD-10-CM | POA: Diagnosis not present

## 2016-02-08 DIAGNOSIS — M109 Gout, unspecified: Secondary | ICD-10-CM | POA: Diagnosis not present

## 2016-02-10 ENCOUNTER — Encounter (HOSPITAL_COMMUNITY): Payer: Self-pay

## 2016-02-10 ENCOUNTER — Encounter (HOSPITAL_COMMUNITY)
Admission: RE | Admit: 2016-02-10 | Discharge: 2016-02-10 | Disposition: A | Discharge status: Home / Self Care | Payer: Medicare Other | Source: Ambulatory Visit | Attending: Neurosurgery | Admitting: Neurosurgery

## 2016-02-10 DIAGNOSIS — I739 Peripheral vascular disease, unspecified: Secondary | ICD-10-CM | POA: Insufficient documentation

## 2016-02-10 DIAGNOSIS — K219 Gastro-esophageal reflux disease without esophagitis: Secondary | ICD-10-CM | POA: Diagnosis not present

## 2016-02-10 DIAGNOSIS — Z96651 Presence of right artificial knee joint: Secondary | ICD-10-CM | POA: Insufficient documentation

## 2016-02-10 DIAGNOSIS — Z79899 Other long term (current) drug therapy: Secondary | ICD-10-CM | POA: Insufficient documentation

## 2016-02-10 DIAGNOSIS — Z01812 Encounter for preprocedural laboratory examination: Secondary | ICD-10-CM | POA: Diagnosis not present

## 2016-02-10 DIAGNOSIS — M549 Dorsalgia, unspecified: Secondary | ICD-10-CM | POA: Insufficient documentation

## 2016-02-10 DIAGNOSIS — M4806 Spinal stenosis, lumbar region: Secondary | ICD-10-CM | POA: Insufficient documentation

## 2016-02-10 DIAGNOSIS — Z95828 Presence of other vascular implants and grafts: Secondary | ICD-10-CM | POA: Diagnosis not present

## 2016-02-10 DIAGNOSIS — I129 Hypertensive chronic kidney disease with stage 1 through stage 4 chronic kidney disease, or unspecified chronic kidney disease: Secondary | ICD-10-CM | POA: Insufficient documentation

## 2016-02-10 DIAGNOSIS — N183 Chronic kidney disease, stage 3 (moderate): Secondary | ICD-10-CM | POA: Insufficient documentation

## 2016-02-10 DIAGNOSIS — J45909 Unspecified asthma, uncomplicated: Secondary | ICD-10-CM | POA: Diagnosis not present

## 2016-02-10 DIAGNOSIS — J449 Chronic obstructive pulmonary disease, unspecified: Secondary | ICD-10-CM | POA: Diagnosis not present

## 2016-02-10 DIAGNOSIS — G8929 Other chronic pain: Secondary | ICD-10-CM | POA: Diagnosis not present

## 2016-02-10 DIAGNOSIS — Z87891 Personal history of nicotine dependence: Secondary | ICD-10-CM | POA: Insufficient documentation

## 2016-02-10 DIAGNOSIS — Z01818 Encounter for other preprocedural examination: Secondary | ICD-10-CM | POA: Insufficient documentation

## 2016-02-10 HISTORY — DX: Other amnesia: R41.3

## 2016-02-10 HISTORY — DX: Gastro-esophageal reflux disease without esophagitis: K21.9

## 2016-02-10 HISTORY — DX: Dorsalgia, unspecified: M54.9

## 2016-02-10 HISTORY — DX: Anesthesia of skin: R20.0

## 2016-02-10 HISTORY — DX: Other chronic pain: G89.29

## 2016-02-10 HISTORY — DX: Personal history of colon polyps, unspecified: Z86.0100

## 2016-02-10 HISTORY — DX: Reserved for inherently not codable concepts without codable children: IMO0001

## 2016-02-10 HISTORY — DX: Pneumonia, unspecified organism: J18.9

## 2016-02-10 HISTORY — DX: Personal history of peptic ulcer disease: Z87.11

## 2016-02-10 HISTORY — DX: Personal history of colonic polyps: Z86.010

## 2016-02-10 HISTORY — DX: Personal history of other diseases of the digestive system: Z87.19

## 2016-02-10 LAB — BASIC METABOLIC PANEL
ANION GAP: 10 (ref 5–15)
BUN: 23 mg/dL — ABNORMAL HIGH (ref 6–20)
CHLORIDE: 111 mmol/L (ref 101–111)
CO2: 22 mmol/L (ref 22–32)
Calcium: 9.9 mg/dL (ref 8.9–10.3)
Creatinine, Ser: 1.91 mg/dL — ABNORMAL HIGH (ref 0.61–1.24)
GFR calc non Af Amer: 34 mL/min — ABNORMAL LOW (ref >60)
GFR, EST AFRICAN AMERICAN: 39 mL/min — AB (ref >60)
Glucose, Bld: 114 mg/dL — ABNORMAL HIGH (ref 65–99)
POTASSIUM: 4.7 mmol/L (ref 3.5–5.1)
SODIUM: 143 mmol/L (ref 135–145)

## 2016-02-10 LAB — CBC
HEMATOCRIT: 41.2 % (ref 39.0–52.0)
HEMOGLOBIN: 13.6 g/dL (ref 13.0–17.0)
MCH: 30 pg (ref 26.0–34.0)
MCHC: 33 g/dL (ref 30.0–36.0)
MCV: 90.7 fL (ref 78.0–100.0)
Platelets: 334 10*3/uL (ref 150–400)
RBC: 4.54 MIL/uL (ref 4.22–5.81)
RDW: 14.9 % (ref 11.5–15.5)
WBC: 7.6 10*3/uL (ref 4.0–10.5)

## 2016-02-10 LAB — SURGICAL PCR SCREEN
MRSA, PCR: NEGATIVE
Staphylococcus aureus: NEGATIVE

## 2016-02-10 LAB — GLUCOSE, CAPILLARY: GLUCOSE-CAPILLARY: 111 mg/dL — AB (ref 65–99)

## 2016-02-10 NOTE — Pre-Procedure Instructions (Signed)
Joseph Hebert  02/10/2016      [id number], [place] [address] [place] [address] Phone: [phone] Fax: [phone]    Your procedure is scheduled on Fri, Mar 3 @ 7:30 AM  Report to [facility] Admitting at 5:30 AM  Call this number if you have problems the morning of surgery:  [phone]   Remember:  Do not eat food or drink liquids after midnight.  Take these medicines the morning of surgery with A SIP OF WATER Albuterol<Bring Your Inhaler With You>,Astelin Nasal Spray,Apresoline(Hydralazine),Namenda(Memantine),Metoprolol(Toprol),Protonix(Pantoprazole),Uloric,and Spriva.              Stop taking your Krill Oil along with any Vitamins or Herbal Medications. No Goody's,BC's,Aleve,Advil,Motrin,or Fish Oil.   Do not wear jewelry.  Do not wear lotions, powders, or colognes.  You may wear deodorant.  Men may shave face and neck.  Do not bring valuables to the hospital.  [facility] is not responsible for any belongings or valuables.  Contacts, dentures or bridgework may not be worn into surgery.  Leave your suitcase in the car.  After surgery it may be brought to your room.  For patients admitted to the hospital, discharge time will be determined by your treatment team.  Patients discharged the day of surgery will not be allowed to drive home.    Special instructions:   - Preparing for Surgery  Before surgery, you can play an important role.  Because skin is not sterile, your skin needs to be as free of germs as possible.  You can reduce the number of germs on you skin by washing with CHG (chlorahexidine gluconate) soap before surgery.  CHG is an antiseptic cleaner which kills germs and bonds with the skin to continue killing germs even after washing.  Please DO NOT use if you have an allergy to CHG or antibacterial soaps.  If your skin becomes reddened/irritated stop using the CHG and inform your nurse  when you arrive at Short Stay.  Do not shave (including legs and underarms) for at least 48 hours prior to the first CHG shower.  You may shave your face.  Please follow these instructions carefully:   1.  Shower with CHG Soap the night before surgery and the                                morning of Surgery.  2.  If you choose to wash your hair, wash your hair first as usual with your       normal shampoo.  3.  After you shampoo, rinse your hair and body thoroughly to remove the                      Shampoo.  4.  Use CHG as you would any other liquid soap.  You can apply chg directly       to the skin and wash gently with scrungie or a clean washcloth.  5.  Apply the CHG Soap to your body ONLY FROM THE NECK DOWN.        Do not use on open wounds or open sores.  Avoid contact with your eyes,       ears, mouth and genitals (private parts).  Wash genitals (private parts)       with your normal soap.  6.  Wash thoroughly,  paying special attention to the area where your surgery        will be performed.  7.  Thoroughly rinse your body with warm water from the neck down.  8.  DO NOT shower/wash with your normal soap after using and rinsing off       the CHG Soap.  9.  Pat yourself dry with a clean towel.            10.  Wear clean pajamas.            11.  Place clean sheets on your bed the night of your first shower and do not        sleep with pets.  Day of Surgery  Do not apply any lotions/deoderants the morning of surgery.  Please wear clean clothes to the hospital/surgery center.    Please read over the following fact sheets that you were given. Pain Booklet, Coughing and Deep Breathing, MRSA Information and Surgical Site Infection Prevention

## 2016-02-10 NOTE — Progress Notes (Signed)
Pt doesn't have a scheduled time for surgery in epic. Spoke with Lorriane Shire at [place] office who states pt is scheduled for 0730 on 02/17/16 and arrival time of 0530. Working to get this into the system

## 2016-02-10 NOTE — Progress Notes (Signed)
Cardiologist is [place] with last visit in epic from 07/2015  Medical Md is Dr.Douglas Katrinka Blazing denies  Stress test report in epic from 07-21-15  EKG in epic from 06-24-15  CXR denies in past yr  Heart cath > 10 yrs ago

## 2016-02-10 NOTE — Progress Notes (Signed)
   02/10/16 1110  OBSTRUCTIVE SLEEP APNEA  Have you ever been diagnosed with sleep apnea through a sleep study? No  Do you snore loudly (loud enough to be heard through closed doors)?  1  Do you often feel tired, fatigued, or sleepy during the daytime (such as falling asleep during driving or talking to someone)? 0  Has anyone observed you stop breathing during your sleep? 0  Do you have, or are you being treated for high blood pressure? 1  BMI more than 35 kg/m2? 0  Age > 50 (1-yes) 1  Neck circumference greater than:Male 16 inches or larger, Male 17inches or larger? 1  Male Gender (Yes=1) 1  Obstructive Sleep Apnea Score 5  Score 5 or greater  Results sent to PCP

## 2016-02-11 LAB — HEMOGLOBIN A1C
Hgb A1c MFr Bld: 6.4 % — ABNORMAL HIGH (ref 4.8–5.6)
Mean Plasma Glucose: 137 mg/dL

## 2016-02-13 NOTE — Progress Notes (Signed)
Anesthesia Chart Review: Patient is a 69 year old male scheduled for L4-5 laminectomy and foraminotomy on 02/17/16 by Dr. Hal Neer.  History includes former smoker, COPD, exertional dyspnea, arrhythmia (not specified), GERD, hiatal hernia, HTN, HLD, asthma, vertigo, head injury as a child, DM2, TB, CKD stage III, arthritis, chronic back pain, gastric ulcer, impaired memory, right TKA, PAD s/p left FPBG 08/23/15, carotid occlusive diseaes s/p left CEA 10/31/10, s/p excision left neck nodule 09/21/15 (sebaceous cyst). CAD is listed on his history, but in review of Dr. Joya Gaskins 07/18/15 office note (Care Everywhere), he writes that "He has a history of having had a heart catheterization by me some more in the range of 10-15 years ago or greater that was normal. He had both an echocardiogram with normal ejection fraction perfusion study 3 years ago that were normal." He recommended a stress test prior to patient having his FPBG last year, since patient had known CAD risk factor with exercise tolerance < 4 METS. Stress test was non-ischemic.   BMI is consistent with obesity. OSA screening score is 5.    PCP is Dr. Nelda Bucks. Cardiologist is Dr. Shirlee More.  Vascular surgeon is Dr. Ruta Hinds who signed a clearance note from a vascular standpoint ONLY.  06/24/15 EKG: SB at 48 bpm, T wave abnormality, consider lateral ischemia. HR 61 bpm at PAT.   07/21/15 Nuclear stress test [facility]): Impression: 1. Functional capacity is not assessed. 2. Normal resting left ventricular size and function, with end-systolic volume of 35 mL and ejection fraction is 63%. 3. No perfusion defects--rest or vasodilators stress.  Conclusion: Negative pharmacologic perfusion stress test for potential ischemia.  12/06/15 Carotid U/S: Impression: 40-59% right internal carotid artery stenosis. Patent left carotid endarterectomy site without evidence of restenosis. Abnormal antegrade right vertebral artery waveform. No  significant change since prior exam. Additional findings: Biphasic subclavian arteries. The left external carotid artery is noted to be tortuous.   Preoperative labs noted. BUN/Cr 23/1.91, previous Cr 1.78-2.32. (Cr was ~ 1.80 even dating back to 2011; known CKD stage III history) Glucose 114. CBC WNL. A1c 6.4.   If no acute changes then I anticipate that he can proceed as planned.  [facility] Short Stay Center/Anesthesiology Phone [phone] 02/13/2016 3:05 PM

## 2016-02-15 DIAGNOSIS — D485 Neoplasm of uncertain behavior of skin: Secondary | ICD-10-CM | POA: Diagnosis not present

## 2016-02-16 MED ORDER — CEFAZOLIN SODIUM-DEXTROSE 2-3 GM-% IV SOLR
2.0000 g | INTRAVENOUS | Status: AC
  Administered 2016-02-17: 2 g via INTRAVENOUS
  Filled 2016-02-16: qty 50

## 2016-02-16 MED ORDER — DEXAMETHASONE SODIUM PHOSPHATE 10 MG/ML IJ SOLN
10.0000 mg | INTRAMUSCULAR | Status: DC
  Filled 2016-02-16: qty 1

## 2016-02-17 ENCOUNTER — Encounter (HOSPITAL_COMMUNITY): Payer: Self-pay | Admitting: *Deleted

## 2016-02-17 ENCOUNTER — Inpatient Hospital Stay (HOSPITAL_COMMUNITY): Payer: Medicare Other | Admitting: Vascular Surgery

## 2016-02-17 ENCOUNTER — Inpatient Hospital Stay (HOSPITAL_COMMUNITY): Payer: Medicare Other

## 2016-02-17 ENCOUNTER — Ambulatory Visit (HOSPITAL_COMMUNITY)
Admission: RE | Admit: 2016-02-17 | Discharge: 2016-02-18 | Disposition: A | Discharge status: Home / Self Care | Payer: Medicare Other | Source: Ambulatory Visit | Attending: Neurological Surgery | Admitting: Neurological Surgery

## 2016-02-17 ENCOUNTER — Encounter (HOSPITAL_COMMUNITY)
Admission: RE | Disposition: A | Discharge status: Home / Self Care | Payer: Self-pay | Source: Ambulatory Visit | Attending: Neurological Surgery

## 2016-02-17 ENCOUNTER — Inpatient Hospital Stay (HOSPITAL_COMMUNITY): Payer: Medicare Other | Admitting: Anesthesiology

## 2016-02-17 DIAGNOSIS — M4727 Other spondylosis with radiculopathy, lumbosacral region: Secondary | ICD-10-CM | POA: Diagnosis not present

## 2016-02-17 DIAGNOSIS — Z7982 Long term (current) use of aspirin: Secondary | ICD-10-CM | POA: Insufficient documentation

## 2016-02-17 DIAGNOSIS — J45909 Unspecified asthma, uncomplicated: Secondary | ICD-10-CM | POA: Diagnosis not present

## 2016-02-17 DIAGNOSIS — K219 Gastro-esophageal reflux disease without esophagitis: Secondary | ICD-10-CM | POA: Insufficient documentation

## 2016-02-17 DIAGNOSIS — J449 Chronic obstructive pulmonary disease, unspecified: Secondary | ICD-10-CM | POA: Insufficient documentation

## 2016-02-17 DIAGNOSIS — Z79899 Other long term (current) drug therapy: Secondary | ICD-10-CM | POA: Insufficient documentation

## 2016-02-17 DIAGNOSIS — E785 Hyperlipidemia, unspecified: Secondary | ICD-10-CM | POA: Insufficient documentation

## 2016-02-17 DIAGNOSIS — I2510 Atherosclerotic heart disease of native coronary artery without angina pectoris: Secondary | ICD-10-CM | POA: Diagnosis not present

## 2016-02-17 DIAGNOSIS — M549 Dorsalgia, unspecified: Secondary | ICD-10-CM | POA: Diagnosis not present

## 2016-02-17 DIAGNOSIS — Z87891 Personal history of nicotine dependence: Secondary | ICD-10-CM | POA: Diagnosis not present

## 2016-02-17 DIAGNOSIS — M4726 Other spondylosis with radiculopathy, lumbar region: Secondary | ICD-10-CM | POA: Diagnosis not present

## 2016-02-17 DIAGNOSIS — M4806 Spinal stenosis, lumbar region: Secondary | ICD-10-CM | POA: Insufficient documentation

## 2016-02-17 DIAGNOSIS — E1151 Type 2 diabetes mellitus with diabetic peripheral angiopathy without gangrene: Secondary | ICD-10-CM | POA: Insufficient documentation

## 2016-02-17 DIAGNOSIS — I499 Cardiac arrhythmia, unspecified: Secondary | ICD-10-CM | POA: Diagnosis not present

## 2016-02-17 DIAGNOSIS — I10 Essential (primary) hypertension: Secondary | ICD-10-CM | POA: Insufficient documentation

## 2016-02-17 DIAGNOSIS — Z9889 Other specified postprocedural states: Secondary | ICD-10-CM | POA: Diagnosis not present

## 2016-02-17 DIAGNOSIS — M109 Gout, unspecified: Secondary | ICD-10-CM | POA: Diagnosis not present

## 2016-02-17 DIAGNOSIS — Z419 Encounter for procedure for purposes other than remedying health state, unspecified: Secondary | ICD-10-CM

## 2016-02-17 HISTORY — PX: LUMBAR LAMINECTOMY/DECOMPRESSION MICRODISCECTOMY: SHX5026

## 2016-02-17 HISTORY — DX: Other spondylosis with radiculopathy, lumbosacral region: M47.27

## 2016-02-17 LAB — GLUCOSE, CAPILLARY
GLUCOSE-CAPILLARY: 93 mg/dL (ref 65–99)
GLUCOSE-CAPILLARY: 97 mg/dL (ref 65–99)
Glucose-Capillary: 111 mg/dL — ABNORMAL HIGH (ref 65–99)
Glucose-Capillary: 99 mg/dL (ref 65–99)
Glucose-Capillary: 156 mg/dL — ABNORMAL HIGH (ref 65–99)

## 2016-02-17 SURGERY — LUMBAR LAMINECTOMY/DECOMPRESSION MICRODISCECTOMY 1 LEVEL
Anesthesia: General | Site: Spine Lumbar | Laterality: Right

## 2016-02-17 MED ORDER — BUPIVACAINE HCL (PF) 0.25 % IJ SOLN
INTRAMUSCULAR | Status: DC | PRN
  Administered 2016-02-17: 15 mL

## 2016-02-17 MED ORDER — ACETAMINOPHEN 325 MG PO TABS: 650.0000 mg | ORAL_TABLET | ORAL | Status: DC | PRN

## 2016-02-17 MED ORDER — HYDROMORPHONE HCL 1 MG/ML IJ SOLN
INTRAMUSCULAR | Status: AC
  Filled 2016-02-17: qty 1

## 2016-02-17 MED ORDER — SODIUM CHLORIDE 0.9 % IV SOLN: INTRAVENOUS | Status: DC

## 2016-02-17 MED ORDER — METHOCARBAMOL 1000 MG/10ML IJ SOLN
500.0000 mg | Freq: Four times a day (QID) | INTRAVENOUS | Status: DC | PRN
  Filled 2016-02-17: qty 5

## 2016-02-17 MED ORDER — PROMETHAZINE HCL 25 MG/ML IJ SOLN: 6.2500 mg | INTRAMUSCULAR | Status: DC | PRN

## 2016-02-17 MED ORDER — ONDANSETRON HCL 4 MG/2ML IJ SOLN
INTRAMUSCULAR | Status: DC | PRN
  Administered 2016-02-17: 4 mg via INTRAVENOUS

## 2016-02-17 MED ORDER — FENOFIBRATE 160 MG PO TABS
160.0000 mg | ORAL_TABLET | Freq: Every day | ORAL | Status: DC
  Filled 2016-02-17: qty 1

## 2016-02-17 MED ORDER — DOCUSATE SODIUM 100 MG PO CAPS
100.0000 mg | ORAL_CAPSULE | Freq: Two times a day (BID) | ORAL | Status: DC
  Administered 2016-02-17 (×2): 100 mg via ORAL
  Filled 2016-02-17 (×2): qty 1

## 2016-02-17 MED ORDER — BISACODYL 5 MG PO TBEC: 5.0000 mg | DELAYED_RELEASE_TABLET | Freq: Every day | ORAL | Status: DC | PRN

## 2016-02-17 MED ORDER — MEMANTINE HCL ER 28 MG PO CP24
28.0000 mg | ORAL_CAPSULE | Freq: Every day | ORAL | Status: DC
  Filled 2016-02-17 (×2): qty 1

## 2016-02-17 MED ORDER — ONDANSETRON HCL 4 MG/2ML IJ SOLN
INTRAMUSCULAR | Status: AC
  Filled 2016-02-17: qty 2

## 2016-02-17 MED ORDER — OXYCODONE-ACETAMINOPHEN 5-325 MG PO TABS
1.0000 | ORAL_TABLET | ORAL | Status: DC | PRN
  Administered 2016-02-17 – 2016-02-18 (×3): 2 via ORAL
  Filled 2016-02-17 (×3): qty 2

## 2016-02-17 MED ORDER — LIDOCAINE-EPINEPHRINE 1 %-1:100000 IJ SOLN
INTRAMUSCULAR | Status: DC | PRN
  Administered 2016-02-17: 15 mL

## 2016-02-17 MED ORDER — THROMBIN 5000 UNITS EX SOLR
CUTANEOUS | Status: DC | PRN
  Administered 2016-02-17 (×2): 5000 [IU] via TOPICAL

## 2016-02-17 MED ORDER — PHENOL 1.4 % MT LIQD: 1.0000 | OROMUCOSAL | Status: DC | PRN

## 2016-02-17 MED ORDER — HYDRALAZINE HCL 25 MG PO TABS
25.0000 mg | ORAL_TABLET | Freq: Two times a day (BID) | ORAL | Status: DC
Start: 2016-02-17 — End: 2016-02-18
  Administered 2016-02-17 (×2): 25 mg via ORAL
  Filled 2016-02-17 (×4): qty 1

## 2016-02-17 MED ORDER — PROPOFOL 10 MG/ML IV BOLUS
INTRAVENOUS | Status: DC | PRN
  Administered 2016-02-17: 150 mg via INTRAVENOUS

## 2016-02-17 MED ORDER — ASPIRIN EC 81 MG PO TBEC
81.0000 mg | DELAYED_RELEASE_TABLET | Freq: Every day | ORAL | Status: DC
  Filled 2016-02-17: qty 1

## 2016-02-17 MED ORDER — HYPROMELLOSE (GONIOSCOPIC) 2.5 % OP SOLN
1.0000 [drp] | Freq: Four times a day (QID) | OPHTHALMIC | Status: DC
  Administered 2016-02-17: 1 [drp] via OPHTHALMIC
  Filled 2016-02-17: qty 15

## 2016-02-17 MED ORDER — LACTATED RINGERS IV SOLN: INTRAVENOUS | Status: DC

## 2016-02-17 MED ORDER — ATORVASTATIN CALCIUM 80 MG PO TABS
80.0000 mg | ORAL_TABLET | Freq: Every evening | ORAL | Status: DC
  Administered 2016-02-17: 80 mg via ORAL
  Filled 2016-02-17 (×2): qty 1

## 2016-02-17 MED ORDER — PHENYLEPHRINE HCL 10 MG/ML IJ SOLN
10.0000 mg | INTRAVENOUS | Status: DC | PRN
  Administered 2016-02-17: 15 ug/min via INTRAVENOUS

## 2016-02-17 MED ORDER — PANTOPRAZOLE SODIUM 40 MG PO TBEC: 40.0000 mg | DELAYED_RELEASE_TABLET | Freq: Every day | ORAL | Status: DC

## 2016-02-17 MED ORDER — METHOCARBAMOL 500 MG PO TABS
500.0000 mg | ORAL_TABLET | Freq: Four times a day (QID) | ORAL | Status: DC | PRN
  Administered 2016-02-17 (×2): 500 mg via ORAL
  Filled 2016-02-17 (×2): qty 1

## 2016-02-17 MED ORDER — FLEET ENEMA 7-19 GM/118ML RE ENEM: 1.0000 | ENEMA | Freq: Once | RECTAL | Status: DC | PRN

## 2016-02-17 MED ORDER — MIDAZOLAM HCL 5 MG/5ML IJ SOLN
INTRAMUSCULAR | Status: DC | PRN
Start: 2016-02-17 — End: 2016-02-17
  Administered 2016-02-17: 1 mg via INTRAVENOUS

## 2016-02-17 MED ORDER — STERILE WATER FOR IRRIGATION IR SOLN
Status: DC | PRN
  Administered 2016-02-17: 1000 mL

## 2016-02-17 MED ORDER — MIDAZOLAM HCL 2 MG/2ML IJ SOLN
INTRAMUSCULAR | Status: AC
  Filled 2016-02-17: qty 2

## 2016-02-17 MED ORDER — HEMOSTATIC AGENTS (NO CHARGE) OPTIME
TOPICAL | Status: DC | PRN
  Administered 2016-02-17: 1 via TOPICAL

## 2016-02-17 MED ORDER — METHYLPREDNISOLONE ACETATE 80 MG/ML IJ SUSP
INTRAMUSCULAR | Status: DC | PRN
  Administered 2016-02-17: 80 mg

## 2016-02-17 MED ORDER — GLYCOPYRROLATE 0.2 MG/ML IJ SOLN
INTRAMUSCULAR | Status: DC | PRN
  Administered 2016-02-17: 0.4 mg via INTRAVENOUS

## 2016-02-17 MED ORDER — LIDOCAINE HCL (CARDIAC) 20 MG/ML IV SOLN
INTRAVENOUS | Status: DC | PRN
  Administered 2016-02-17: 40 mg via INTRAVENOUS

## 2016-02-17 MED ORDER — PANTOPRAZOLE SODIUM 40 MG PO TBEC
40.0000 mg | DELAYED_RELEASE_TABLET | Freq: Every day | ORAL | Status: DC
  Administered 2016-02-17 (×2): 40 mg via ORAL
  Filled 2016-02-17 (×2): qty 1

## 2016-02-17 MED ORDER — CEFAZOLIN SODIUM 1-5 GM-% IV SOLN
1.0000 g | Freq: Three times a day (TID) | INTRAVENOUS | Status: AC
  Administered 2016-02-17 (×2): 1 g via INTRAVENOUS
  Filled 2016-02-17 (×2): qty 50

## 2016-02-17 MED ORDER — METOPROLOL SUCCINATE ER 50 MG PO TB24
50.0000 mg | ORAL_TABLET | Freq: Every day | ORAL | Status: DC
  Filled 2016-02-17 (×3): qty 1

## 2016-02-17 MED ORDER — LISINOPRIL 40 MG PO TABS
40.0000 mg | ORAL_TABLET | Freq: Every day | ORAL | Status: DC
  Administered 2016-02-17: 40 mg via ORAL
  Filled 2016-02-17 (×2): qty 1
  Filled 2016-02-17: qty 2
  Filled 2016-02-17: qty 1

## 2016-02-17 MED ORDER — AMLODIPINE BESYLATE 10 MG PO TABS
10.0000 mg | ORAL_TABLET | Freq: Every day | ORAL | Status: DC
  Administered 2016-02-17: 10 mg via ORAL
  Filled 2016-02-17 (×3): qty 1

## 2016-02-17 MED ORDER — SUGAMMADEX SODIUM 200 MG/2ML IV SOLN
INTRAVENOUS | Status: DC | PRN
  Administered 2016-02-17: 200 mg via INTRAVENOUS

## 2016-02-17 MED ORDER — NABUMETONE 500 MG PO TABS
500.0000 mg | ORAL_TABLET | Freq: Two times a day (BID) | ORAL | Status: DC
  Administered 2016-02-17 (×2): 500 mg via ORAL
  Filled 2016-02-17 (×4): qty 1

## 2016-02-17 MED ORDER — LACTATED RINGERS IV SOLN
INTRAVENOUS | Status: DC | PRN
  Administered 2016-02-17: 07:00:00 via INTRAVENOUS

## 2016-02-17 MED ORDER — THROMBIN 5000 UNITS EX SOLR
OROMUCOSAL | Status: DC | PRN
  Administered 2016-02-17: 5 mL via TOPICAL

## 2016-02-17 MED ORDER — FENTANYL CITRATE (PF) 250 MCG/5ML IJ SOLN
INTRAMUSCULAR | Status: AC
  Filled 2016-02-17: qty 5

## 2016-02-17 MED ORDER — AZELASTINE HCL 0.1 % NA SOLN: 1.0000 | Freq: Two times a day (BID) | NASAL | Status: DC | PRN

## 2016-02-17 MED ORDER — ROCURONIUM BROMIDE 50 MG/5ML IV SOLN
INTRAVENOUS | Status: AC
  Filled 2016-02-17: qty 1

## 2016-02-17 MED ORDER — ZOLPIDEM TARTRATE 5 MG PO TABS: 5.0000 mg | ORAL_TABLET | Freq: Every evening | ORAL | Status: DC | PRN

## 2016-02-17 MED ORDER — HYDROCODONE-ACETAMINOPHEN 5-325 MG PO TABS: 1.0000 | ORAL_TABLET | ORAL | Status: DC | PRN

## 2016-02-17 MED ORDER — MENTHOL 3 MG MT LOZG
1.0000 | LOZENGE | OROMUCOSAL | Status: DC | PRN
  Filled 2016-02-17: qty 9

## 2016-02-17 MED ORDER — PANTOPRAZOLE SODIUM 40 MG IV SOLR: 40.0000 mg | Freq: Every day | INTRAVENOUS | Status: DC

## 2016-02-17 MED ORDER — SUGAMMADEX SODIUM 200 MG/2ML IV SOLN
INTRAVENOUS | Status: AC
  Filled 2016-02-17: qty 2

## 2016-02-17 MED ORDER — ACETAMINOPHEN 650 MG RE SUPP: 650.0000 mg | RECTAL | Status: DC | PRN

## 2016-02-17 MED ORDER — MEPERIDINE HCL 25 MG/ML IJ SOLN: 6.2500 mg | INTRAMUSCULAR | Status: DC | PRN

## 2016-02-17 MED ORDER — ACETAMINOPHEN-CODEINE #2 300-15 MG PO TABS: 1.0000 | ORAL_TABLET | ORAL | Status: DC | PRN

## 2016-02-17 MED ORDER — SODIUM CHLORIDE 0.9 % IR SOLN
Status: DC | PRN
  Administered 2016-02-17: 500 mL

## 2016-02-17 MED ORDER — LIDOCAINE HCL (CARDIAC) 20 MG/ML IV SOLN
INTRAVENOUS | Status: AC
  Filled 2016-02-17: qty 5

## 2016-02-17 MED ORDER — ADULT MULTIVITAMIN W/MINERALS CH
1.0000 | ORAL_TABLET | Freq: Every day | ORAL | Status: DC
Start: 2016-02-17 — End: 2016-02-18
  Administered 2016-02-17: 1 via ORAL
  Filled 2016-02-17: qty 1

## 2016-02-17 MED ORDER — 0.9 % SODIUM CHLORIDE (POUR BTL) OPTIME
TOPICAL | Status: DC | PRN
  Administered 2016-02-17: 1000 mL

## 2016-02-17 MED ORDER — FEBUXOSTAT 40 MG PO TABS
40.0000 mg | ORAL_TABLET | Freq: Every day | ORAL | Status: DC
  Filled 2016-02-17 (×2): qty 1

## 2016-02-17 MED ORDER — HYDROMORPHONE HCL 1 MG/ML IJ SOLN
0.5000 mg | INTRAMUSCULAR | Status: DC | PRN
  Administered 2016-02-17: 1 mg via INTRAVENOUS
  Filled 2016-02-17: qty 1

## 2016-02-17 MED ORDER — SENNA 8.6 MG PO TABS
1.0000 | ORAL_TABLET | Freq: Two times a day (BID) | ORAL | Status: DC
  Administered 2016-02-17 (×2): 8.6 mg via ORAL
  Filled 2016-02-17 (×2): qty 1

## 2016-02-17 MED ORDER — PROPOFOL 10 MG/ML IV BOLUS
INTRAVENOUS | Status: AC
  Filled 2016-02-17: qty 20

## 2016-02-17 MED ORDER — HYDROMORPHONE HCL 1 MG/ML IJ SOLN
0.2500 mg | INTRAMUSCULAR | Status: DC | PRN
  Administered 2016-02-17: 0.5 mg via INTRAVENOUS

## 2016-02-17 MED ORDER — ONDANSETRON HCL 4 MG/2ML IJ SOLN: 4.0000 mg | INTRAMUSCULAR | Status: DC | PRN

## 2016-02-17 MED ORDER — ROCURONIUM BROMIDE 100 MG/10ML IV SOLN
INTRAVENOUS | Status: DC | PRN
  Administered 2016-02-17: 40 mg via INTRAVENOUS

## 2016-02-17 MED ORDER — SODIUM CHLORIDE 0.9% FLUSH: 3.0000 mL | INTRAVENOUS | Status: DC | PRN

## 2016-02-17 MED ORDER — EPHEDRINE SULFATE 50 MG/ML IJ SOLN
INTRAMUSCULAR | Status: DC | PRN
  Administered 2016-02-17: 10 mg via INTRAVENOUS
  Administered 2016-02-17: 5 mg via INTRAVENOUS

## 2016-02-17 MED ORDER — FENTANYL CITRATE (PF) 100 MCG/2ML IJ SOLN
INTRAMUSCULAR | Status: DC | PRN
  Administered 2016-02-17 (×2): 50 ug via INTRAVENOUS

## 2016-02-17 MED ORDER — SODIUM CHLORIDE 0.9% FLUSH
3.0000 mL | Freq: Two times a day (BID) | INTRAVENOUS | Status: DC
  Administered 2016-02-17 (×2): 3 mL via INTRAVENOUS

## 2016-02-17 SURGICAL SUPPLY — 67 items
ADH SKN CLS APL DERMABOND .7 (GAUZE/BANDAGES/DRESSINGS) ×1
APL SKNCLS STERI-STRIP NONHPOA (GAUZE/BANDAGES/DRESSINGS)
BENZOIN TINCTURE PRP APPL 2/3 (GAUZE/BANDAGES/DRESSINGS) IMPLANT
BIT DRILL NEURO 2X3.1 SFT TUCH (MISCELLANEOUS) ×1 IMPLANT
BLADE CLIPPER SURG (BLADE) ×2 IMPLANT
BLADE SURG 11 STRL SS (BLADE) ×3 IMPLANT
BUR ROUND FLUTED 5 RND (BURR) ×2 IMPLANT
BUR ROUND FLUTED 5MM RND (BURR) ×1
CANISTER SUCT 3000ML PPV (MISCELLANEOUS) ×4 IMPLANT
CHLORAPREP W/TINT 26ML (MISCELLANEOUS) ×3 IMPLANT
CLOSURE WOUND 1/2 X4 (GAUZE/BANDAGES/DRESSINGS)
DECANTER SPIKE VIAL GLASS SM (MISCELLANEOUS) ×3 IMPLANT
DERMABOND ADVANCED (GAUZE/BANDAGES/DRESSINGS) ×2
DERMABOND ADVANCED .7 DNX12 (GAUZE/BANDAGES/DRESSINGS) ×1 IMPLANT
DRAPE MICROSCOPE LEICA (MISCELLANEOUS) ×3 IMPLANT
DRAPE POUCH INSTRU U-SHP 10X18 (DRAPES) ×3 IMPLANT
DRAPE SURG 17X23 STRL (DRAPES) ×3 IMPLANT
DRILL NEURO 2X3.1 SOFT TOUCH (MISCELLANEOUS) ×3
DRSG OPSITE POSTOP 4X6 (GAUZE/BANDAGES/DRESSINGS) ×2 IMPLANT
ELECT REM PT RETURN 9FT ADLT (ELECTROSURGICAL) ×3
ELECTRODE REM PT RTRN 9FT ADLT (ELECTROSURGICAL) ×1 IMPLANT
GAUZE SPONGE 4X4 12PLY STRL (GAUZE/BANDAGES/DRESSINGS) IMPLANT
GAUZE SPONGE 4X4 16PLY XRAY LF (GAUZE/BANDAGES/DRESSINGS) IMPLANT
GLOVE BIOGEL PI IND STRL 7.0 (GLOVE) IMPLANT
GLOVE BIOGEL PI IND STRL 7.5 (GLOVE) ×2 IMPLANT
GLOVE BIOGEL PI INDICATOR 7.0 (GLOVE) ×4
GLOVE BIOGEL PI INDICATOR 7.5 (GLOVE) ×4
GLOVE ECLIPSE 6.5 STRL STRAW (GLOVE) ×2 IMPLANT
GLOVE EXAM NITRILE LRG STRL (GLOVE) IMPLANT
GLOVE EXAM NITRILE MD LF STRL (GLOVE) IMPLANT
GLOVE EXAM NITRILE XL STR (GLOVE) IMPLANT
GLOVE EXAM NITRILE XS STR PU (GLOVE) IMPLANT
GLOVE SS BIOGEL STRL SZ 7 (GLOVE) ×2 IMPLANT
GLOVE SUPERSENSE BIOGEL SZ 7 (GLOVE) ×4
GLOVE SURG SS PI 7.0 STRL IVOR (GLOVE) ×2 IMPLANT
GOWN STRL REUS W/ TWL LRG LVL3 (GOWN DISPOSABLE) ×1 IMPLANT
GOWN STRL REUS W/ TWL XL LVL3 (GOWN DISPOSABLE) IMPLANT
GOWN STRL REUS W/TWL LRG LVL3 (GOWN DISPOSABLE) ×9
GOWN STRL REUS W/TWL XL LVL3 (GOWN DISPOSABLE)
HEMOSTAT POWDER KIT SURGIFOAM (HEMOSTASIS) ×3 IMPLANT
KIT BASIN OR (CUSTOM PROCEDURE TRAY) ×3 IMPLANT
KIT ROOM TURNOVER OR (KITS) ×3 IMPLANT
NDL BLUNT 18X1 FOR OR ONLY (NEEDLE) IMPLANT
NDL HYPO 21X1.5 SAFETY (NEEDLE) ×1 IMPLANT
NDL HYPO 25X1 1.5 SAFETY (NEEDLE) ×1 IMPLANT
NEEDLE BLUNT 18X1 FOR OR ONLY (NEEDLE) ×3 IMPLANT
NEEDLE HYPO 21X1.5 SAFETY (NEEDLE) ×3 IMPLANT
NEEDLE HYPO 25X1 1.5 SAFETY (NEEDLE) ×3 IMPLANT
NS IRRIG 1000ML POUR BTL (IV SOLUTION) ×3 IMPLANT
PACK LAMINECTOMY NEURO (CUSTOM PROCEDURE TRAY) ×3 IMPLANT
PACK UNIVERSAL I (CUSTOM PROCEDURE TRAY) ×3 IMPLANT
PAD ARMBOARD 7.5X6 YLW CONV (MISCELLANEOUS) ×13 IMPLANT
PATTIES SURGICAL .5X1.5 (GAUZE/BANDAGES/DRESSINGS) ×1 IMPLANT
RUBBERBAND STERILE (MISCELLANEOUS) ×6 IMPLANT
SPONGE SURGIFOAM ABS GEL SZ50 (HEMOSTASIS) ×3 IMPLANT
STRIP CLOSURE SKIN 1/2X4 (GAUZE/BANDAGES/DRESSINGS) IMPLANT
SUT VIC AB 0 CT1 18XCR BRD8 (SUTURE) ×1 IMPLANT
SUT VIC AB 0 CT1 8-18 (SUTURE) ×3
SUT VIC AB 2-0 CT1 18 (SUTURE) ×3 IMPLANT
SUT VIC AB 3-0 SH 8-18 (SUTURE) ×3 IMPLANT
SYR 30ML LL (SYRINGE) ×3 IMPLANT
SYR 3ML LL SCALE MARK (SYRINGE) ×3 IMPLANT
TOWEL OR 17X24 6PK STRL BLUE (TOWEL DISPOSABLE) ×3 IMPLANT
TOWEL OR 17X26 10 PK STRL BLUE (TOWEL DISPOSABLE) ×3 IMPLANT
TUBE CONNECTING 12'X1/4 (SUCTIONS)
TUBE CONNECTING 12X1/4 (SUCTIONS) ×1 IMPLANT
WATER STERILE IRR 1000ML POUR (IV SOLUTION) ×3 IMPLANT
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
## 2016-02-17 NOTE — Brief Op Note (Signed)
02/17/2016  9:02 AM  PATIENT:  Joseph Hebert  70 y.o. male  PRE-OPERATIVE DIAGNOSIS:  Lumbosacral spondylosis with radiculopathy, lumbar stenosis right L4-5  POST-OPERATIVE DIAGNOSIS:  Same  PROCEDURE:  Procedure(s) with comments: Laminectomy and Foraminotomy - Lumbar four-five right (Right) - right  SURGEON:  Surgeon(s) and Role:    * Tamala Fothergill, MD - Primary    * Ashok Pall, MD - Assisting  PHYSICIAN ASSISTANT:   ASSISTANTS: Ashok Pall, MD  ANESTHESIA:   general  EBL:  Total I/O In: -  Out: 25 [Blood:25]  BLOOD ADMINISTERED:none  DRAINS: none   LOCAL MEDICATIONS USED:  MARCAINE    and LIDOCAINE   SPECIMEN:  No Specimen  DISPOSITION OF SPECIMEN:  N/A  COUNTS:  YES  TOURNIQUET:  * No tourniquets in log *  DICTATION: .Dragon Dictation  PLAN OF CARE: Admit for overnight observation  PATIENT DISPOSITION:  PACU - hemodynamically stable.   Delay start of Pharmacological VTE agent (>24hrs) due to surgical blood loss or risk of bleeding: yes

## 2016-02-17 NOTE — H&P (Signed)
CC:  No chief complaint on file.   HPI: Joseph Hebert is a 70 y.o. male with right lower extremity radiculopathy refractory to aggressive medical management.  He was originally scheduled for Dr. Hal Neer but because he was unavailable I was asked to see him.  PMH: Past Medical History  Diagnosis Date  . Hyperlipidemia     takes Atorvastatin and Fenofibrate daily  . Carotid artery occlusion     with Claudication  . Asthma   . Vertigo   . Gout     takes Uloric daily  . CAD (coronary artery disease)   . Head injury     as a child  . Tuberculosis 2006     9 months   . Arthritis   . COPD (chronic obstructive pulmonary disease) (HCC)     Spirva daily and Albuterol as needed  . Hypertension     takes Lisinopril,Amlodipine,and Hydralazine daily  . GERD (gastroesophageal reflux disease)     takes Protonix daily  . Arrhythmia     takes metoprolol daily  . Chronic back pain     stenosis  . Numbness     lower left leg and upper right thigh  . Shortness of breath dyspnea     with exertion  . Pneumonia     hx of-couple of yrs ago  . History of hiatal hernia   . History of gastric ulcer   . History of colon polyps     benign  . Diabetes mellitus without complication (HCC)     Type 2 diet controlled.Never been on meds  . Impaired memory     takes Namenda daily    PSH: Past Surgical History  Procedure Laterality Date  . Carotid endarterectomy  Nov. 15,2011    LEFT cea  . Joint replacement  1980    RIGHT  knee  . Stents  Aug.  23, 1999    Bilateral iliofemoral  stents, [place].  . Peripheral vascular catheterization N/A 06/24/2015    Procedure: Abdominal Aortogram;  Surgeon: [name], MD;  Location: [facility];  Service: Cardiovascular;  Laterality: N/A;  . Femoral-popliteal bypass graft Left 08/23/2015    Procedure: LEFT FEMORAL-POPLITEAL ARTERY BYPASS WITH GORETEX GRAFT;  Surgeon: [name], MD;  Location: [facility];  Service: Vascular;  Laterality: Left;   . Colonoscopy    . Carotid body tumor excision Left 09/21/2015    Procedure: EXCISE LEFT NECK NODULE WITH LOCAL ;  Surgeon: [name], MD;  Location: [place];  Service: Vascular;  Laterality: Left;  . Lumbar epidural injection    . Cataract surgery Bilateral     SH: Social History  Substance Use Topics  . Smoking status: Former Smoker -- 0.30 packs/day    Types: Cigarettes  . Smokeless tobacco: Never Used     Comment: hasn't smoked in 3 days  . Alcohol Use: No    MEDS: Prior to Admission medications   Medication Sig Start Date End Date Taking? Authorizing Provider  amLODipine (NORVASC) 10 MG tablet Take 10 mg by mouth daily.   Yes Historical Provider, MD  aspirin EC 81 MG tablet Take 81 mg by mouth daily.   Yes Historical Provider, MD  atorvastatin (LIPITOR) 80 MG tablet Take 80 mg by mouth every evening.    Yes Historical Provider, MD  azelastine (ASTELIN) 137 MCG/SPRAY nasal spray Place 1 spray into both nostrils 2 (two) times daily as needed for rhinitis or allergies. Use in each  nostril as needed for allergy symptoms   Yes Historical Provider, MD  fenofibrate (TRICOR) 145 MG tablet Take 145 mg by mouth at bedtime.    Yes Historical Provider, MD  hydrALAZINE (APRESOLINE) 25 MG tablet Take 25 mg by mouth 2 (two) times daily. 05/17/15  Yes Historical Provider, MD  hydroxypropyl methylcellulose / hypromellose (ISOPTO TEARS / GONIOVISC) 2.5 % ophthalmic solution Place 1 drop into both eyes 4 (four) times daily.   Yes Historical Provider, MD  lisinopril (PRINIVIL,ZESTRIL) 40 MG tablet Take 40 mg by mouth daily.   Yes Historical Provider, MD  memantine (NAMENDA XR) 28 MG CP24 24 hr capsule Take 28 mg by mouth daily.   Yes Historical Provider, MD  metoprolol succinate (TOPROL-XL) 50 MG 24 hr tablet Take 50 mg by mouth daily. Take with or immediately following a meal.   Yes Historical Provider, MD  Multiple Vitamin (MULTIVITAMIN) tablet Take 1 tablet by mouth daily.   Yes Historical  Provider, MD  nabumetone (RELAFEN) 500 MG tablet Take 500 mg by mouth 2 (two) times daily.  02/04/16  Yes Historical Provider, MD  pantoprazole (PROTONIX) 40 MG tablet Take 40 mg by mouth daily. 05/31/15  Yes Historical Provider, MD  ULORIC 40 MG tablet Take 40 mg by mouth daily. 01/05/15  Yes Historical Provider, MD  acetaminophen-codeine (TYLENOL #2) 300-15 MG tablet Take 1 tablet by mouth every 4 (four) hours as needed for moderate pain. Patient not taking: Reported on 02/10/2016 09/21/15   [name], MD    ALLERGY: No Known Allergies  ROS: ROS  NEUROLOGIC EXAM: Awake, alert, oriented Memory and concentration grossly intact Speech fluent, appropriate CN grossly intact Motor exam: Upper Extremities Deltoid Bicep Tricep Grip  Right 5/5 5/5 5/5 5/5  Left 5/5 5/5 5/5 5/5   Lower Extremity IP Quad PF DF EHL  Right 5/5 5/5 5/5 5/5 5/5  Left 5/5 5/5 5/5 5/5 5/5   Sensation grossly intact to LT  IMAGING: Right L4-5 lateral recess stenosis.  Multilevel spondylosis.  IMPRESSION: - 70 y.o. male with lumbosacral spondylosis and radiculopathy, right L4-5 stenosis.  PLAN: - Right L4-5 laminoforaminotomy - Patient understands risks and benefits of surgery, including failure to accomplish the goal of surgery, requiring additional procedures - All questions answered - Ready for surgery

## 2016-02-17 NOTE — Op Note (Signed)
02/17/2016  2:24 PM  PATIENT:  Joseph Hebert  70 y.o. male  PRE-OPERATIVE DIAGNOSIS:  Lumbosacral spondylosis with radiculopathy, lumbar stenosis right L4-5  POST-OPERATIVE DIAGNOSIS:  Same  PROCEDURE:  Right L4-5 lamina foraminotomy, use of operating microscope  SURGEON:  Aldean Ast, MD  ASSISTANTS: Ashok Pall, M.D.  ANESTHESIA:   General  DRAINS: None  SPECIMEN:  None  INDICATION FOR PROCEDURE: 70 year old male with lumbar radiculopathy affecting his right lower extremity. He has had aggressive medical therapy and has not improved. His preoperative imaging reveals lateral recess stenosis at L4-5 on the right due to ligamentous and facet hypertrophy. Patient understood the risks, benefits, and alternatives and potential outcomes and wished to proceed.  PROCEDURE DETAILS: After smooth induction of general endotracheal anesthesia the patient was turned prone on a regular OR table with a Wilson frame. His low back was clipped of hair. It was wiped down with alcohol. Lidocaine with epinephrine was injected along the midline. A needle was used to localize the L4-5 disc space using lateral x-ray. When the appropriate level was confirmed the skin was prepped and draped in the usual sterile fashion.  A midline incision was made around the previously localized point. Soft tissue was dissected with monopolar cautery. Unilateral subperiosteal dissection was performed on the right side of the spinous process of L4 and the superior laminar surface of L5. The McCullough retractor was inserted into the field with the post through a small fascial incision on the contralateral side. A second film was then taken which showed that the instrument was at the trailing edge of the L4 lamina.  Using a high-speed bur the trailing edge of the L4 lamina was thinned. This was then further removed with Kerrison rongeurs. The superior edge of L5 lamina was removed as well. Ligamentum flavum was removed and  the L5 nerve root was identified. The hemilaminectomy was continued laterally. A medial facetectomy was performed. A foraminotomy was performed at L4-5 on the right. I palpated on the surface of the L5 nerve root and confirmed that there was no compression of the foramen. I was satisfied with the decompression at this point. Hemostasis was obtained.  80 mg of Depo-Medrol was injected around the L5 nerve root and thecal sac. The wound was then closed in routine anatomic layers using interrupted Vicryl sutures. The subdermal tissues were injected with plain Marcaine. The skin was closed with a running Vicryl suture and sealed with Dermabond. The patient was returned to the supine position on the stretcher.  PATIENT DISPOSITION:  PACU - hemodynamically stable.   Delay start of Pharmacological VTE agent (>24hrs) due to surgical blood loss or risk of bleeding:  yes

## 2016-02-17 NOTE — Anesthesia Preprocedure Evaluation (Addendum)
Anesthesia Evaluation  Patient identified by MRN, date of birth, ID band Patient awake    Reviewed: Allergy & Precautions, NPO status , Patient's Chart, lab work & pertinent test results  Airway Mallampati: III  TM Distance: >3 FB Neck ROM: Full    Dental  (+) Teeth Intact   Pulmonary asthma , COPD, former smoker,    breath sounds clear to auscultation       Cardiovascular hypertension, Pt. on medications and Pt. on home beta blockers + CAD and + Peripheral Vascular Disease  + dysrhythmias  Rhythm:Regular Rate:Normal     Neuro/Psych negative neurological ROS  negative psych ROS   GI/Hepatic Neg liver ROS, hiatal hernia, GERD  Medicated,  Endo/Other  diabetes  Renal/GU Renal disease  negative genitourinary   Musculoskeletal  (+) Arthritis ,   Abdominal   Peds negative pediatric ROS (+)  Hematology negative hematology ROS (+)   Anesthesia Other Findings   Reproductive/Obstetrics negative OB ROS                            Lab Results  Component Value Date   WBC 7.6 02/10/2016   HGB 13.6 02/10/2016   HCT 41.2 02/10/2016   MCV 90.7 02/10/2016   PLT 334 02/10/2016   Lab Results  Component Value Date   CREATININE 1.91* 02/10/2016   BUN 23* 02/10/2016   NA 143 02/10/2016   K 4.7 02/10/2016   CL 111 02/10/2016   CO2 22 02/10/2016   Lab Results  Component Value Date   INR 0.93 08/17/2015   INR 0.92 10/26/2010   06/2015 EKG: sinus bradycardia.    Anesthesia Physical Anesthesia Plan  ASA: III  Anesthesia Plan: General   Post-op Pain Management:    Induction: Intravenous  Airway Management Planned: Oral ETT  Additional Equipment:   Intra-op Plan:   Post-operative Plan: Extubation in OR  Informed Consent: I have reviewed the patients History and Physical, chart, labs and discussed the procedure including the risks, benefits and alternatives for the proposed anesthesia  with the patient or authorized representative who has indicated his/her understanding and acceptance.   Dental advisory given  Plan Discussed with: CRNA  Anesthesia Plan Comments:         Anesthesia Quick Evaluation

## 2016-02-17 NOTE — Transfer of Care (Signed)
Immediate Anesthesia Transfer of Care Note  Patient: Joseph Hebert  Procedure(s) Performed: Procedure(s) with comments: Laminectomy and Foraminotomy - Lumbar four-five right (Right) - right  Patient Location: PACU  Anesthesia Type:General  Level of Consciousness: awake, alert  and oriented  Airway & Oxygen Therapy: Patient Spontanous Breathing and Patient connected to nasal cannula oxygen  Post-op Assessment: Report given to RN, Post -op Vital signs reviewed and stable and Patient moving all extremities X 4  Post vital signs: Reviewed and stable  Last Vitals:  Filed Vitals:   02/17/16 0622  BP: 178/71  Pulse: 57  Temp: [id number] C    Complications: No apparent anesthesia complications

## 2016-02-17 NOTE — Discharge Summary (Signed)
Date of Admission: 02/17/2016  Date of Discharge: 02/18/2016  Admission Diagnosis: Lumbosacral spondylosis with radiculopathy, lateral recess stenosis right L4-5  Discharge Diagnosis: Same   Procedure Performed: Right L4-5 laminoforaminotomy  Attending: [facility] Course:  The patient was admitted to the hospital on the afternoon of surgery and taken for the above listed operation. He had a uneventful postoperative course and was discharged home on postoperative day 1 in stable medical neurological condition.  Discharged Medications: Resume prior medications  Follow up: With me in 2 weeks

## 2016-02-17 NOTE — Anesthesia Postprocedure Evaluation (Signed)
Anesthesia Post Note  Patient: Joseph Hebert  Procedure(s) Performed: Procedure(s) (LRB): Laminectomy and Foraminotomy - Lumbar four-five right (Right)  Patient location during evaluation: PACU Anesthesia Type: General Level of consciousness: awake and alert Pain management: pain level controlled Vital Signs Assessment: post-procedure vital signs reviewed and stable Respiratory status: spontaneous breathing, nonlabored ventilation, respiratory function stable and patient connected to nasal cannula oxygen Cardiovascular status: blood pressure returned to baseline and stable Postop Assessment: no signs of nausea or vomiting Anesthetic complications: no    Last Vitals:  Filed Vitals:   02/17/16 1032 02/17/16 1047  BP: 152/87 150/82  Pulse: 66 61  Temp:  36.6 C  Resp: 19 12    Last Pain:  Filed Vitals:   02/17/16 1104  PainSc: 0-No pain                 Effie Berkshire

## 2016-02-17 NOTE — Progress Notes (Signed)
Awake and alert Moving right leg well, good dorsi and plantarflexion HDS To floor when room available

## 2016-02-17 NOTE — Anesthesia Procedure Notes (Signed)
Procedure Name: Intubation Date/Time: 02/17/2016 7:42 AM Performed by: Clearnce Sorrel Pre-anesthesia Checklist: Patient identified, Timeout performed, Emergency Drugs available, Suction available and Patient being monitored Patient Re-evaluated:Patient Re-evaluated prior to inductionOxygen Delivery Method: Circle system utilized Preoxygenation: Pre-oxygenation with 100% oxygen Intubation Type: IV induction Ventilation: Mask ventilation without difficulty Laryngoscope Size: Glidescope and 4 (limited neck ROM, previous difficult airway) Grade View: Grade I Tube type: Oral Tube size: 7.5 mm Number of attempts: 1 Airway Equipment and Method: Stylet Placement Confirmation: ETT inserted through vocal cords under direct vision,  breath sounds checked- equal and bilateral and positive ETCO2 Secured at: 24 cm Tube secured with: Tape Dental Injury: Teeth and Oropharynx as per pre-operative assessment

## 2016-02-18 DIAGNOSIS — M109 Gout, unspecified: Secondary | ICD-10-CM | POA: Diagnosis not present

## 2016-02-18 DIAGNOSIS — E785 Hyperlipidemia, unspecified: Secondary | ICD-10-CM | POA: Diagnosis not present

## 2016-02-18 DIAGNOSIS — M4806 Spinal stenosis, lumbar region: Secondary | ICD-10-CM | POA: Diagnosis not present

## 2016-02-18 DIAGNOSIS — I2510 Atherosclerotic heart disease of native coronary artery without angina pectoris: Secondary | ICD-10-CM | POA: Diagnosis not present

## 2016-02-18 DIAGNOSIS — J449 Chronic obstructive pulmonary disease, unspecified: Secondary | ICD-10-CM | POA: Diagnosis not present

## 2016-02-18 DIAGNOSIS — M4727 Other spondylosis with radiculopathy, lumbosacral region: Secondary | ICD-10-CM | POA: Diagnosis not present

## 2016-02-18 LAB — BASIC METABOLIC PANEL
Anion gap: 14 (ref 5–15)
BUN: 27 mg/dL — AB (ref 6–20)
CALCIUM: 9.7 mg/dL (ref 8.9–10.3)
CO2: 21 mmol/L — AB (ref 22–32)
CREATININE: 1.85 mg/dL — AB (ref 0.61–1.24)
Chloride: 103 mmol/L (ref 101–111)
GFR calc non Af Amer: 35 mL/min — ABNORMAL LOW (ref >60)
GFR, EST AFRICAN AMERICAN: 41 mL/min — AB (ref >60)
GLUCOSE: 122 mg/dL — AB (ref 65–99)
Potassium: 4.7 mmol/L (ref 3.5–5.1)
Sodium: 138 mmol/L (ref 135–145)

## 2016-02-18 LAB — CBC
HEMATOCRIT: 36.7 % — AB (ref 39.0–52.0)
Hemoglobin: 12.4 g/dL — ABNORMAL LOW (ref 13.0–17.0)
MCH: 30 pg (ref 26.0–34.0)
MCHC: 33.8 g/dL (ref 30.0–36.0)
MCV: 88.6 fL (ref 78.0–100.0)
Platelets: 285 10*3/uL (ref 150–400)
RBC: 4.14 MIL/uL — ABNORMAL LOW (ref 4.22–5.81)
RDW: 15 % (ref 11.5–15.5)
WBC: 14.5 10*3/uL — ABNORMAL HIGH (ref 4.0–10.5)

## 2016-02-18 LAB — GLUCOSE, CAPILLARY: Glucose-Capillary: 150 mg/dL — ABNORMAL HIGH (ref 65–99)

## 2016-02-18 MED ORDER — OXYCODONE-ACETAMINOPHEN 5-325 MG PO TABS: 1.0000 | ORAL_TABLET | Freq: Four times a day (QID) | ORAL | Status: DC | PRN

## 2016-02-18 MED ORDER — TIZANIDINE HCL 4 MG PO TABS: 4.0000 mg | ORAL_TABLET | Freq: Four times a day (QID) | ORAL | Status: DC | PRN

## 2016-02-18 NOTE — Discharge Instructions (Signed)
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
## 2016-02-18 NOTE — Discharge Summary (Signed)
Physician Discharge Summary  Patient ID: Joseph Hebert MRN: [id number] DOB/AGE: 03/15/1946 70 y.o.  Admit date: 02/17/2016 Discharge date: 02/18/2016  Admission Diagnoses:Lumbosacral spondylosis with radiculopathy, lumbar stenosis right    Discharge Diagnoses: Lumbosacral spondylosis with radiculopathy, lumbar stenosis right   Active Problems:   Lumbosacral spondylosis with radiculopathy   Discharged Condition: good  Hospital Course: Joseph Hebert was taken to the operating room and underwent an uncomplicated procedure to decompress the spinal canal and lateral recess at L4/5 on the right side. Post op he is ambulating, voiding, and tolerating a regular diet. His wound is clean, dry, and without signs of infection. He has normal strength in the lower extremities.   Consults: None  Significant Diagnostic Studies: none  Treatments: surgery: Right L4-5 lamina foraminotomy   Discharge Exam: Blood pressure 164/72, pulse 60, temperature 97.5 F (36.4 C), temperature source Oral, resp. rate 18, height 5' 10.5" (1.791 m), weight 224 lb (101.606 kg), SpO2 95 %. General appearance: alert, cooperative, appears stated age and no distress  Disposition: 01-Home or Self Care     Medication List    TAKE these medications        acetaminophen-codeine 300-15 MG tablet  Commonly known as:  TYLENOL #2  Take 1 tablet by mouth every 4 (four) hours as needed for moderate pain.     amLODipine 10 MG tablet  Commonly known as:  NORVASC  Take 10 mg by mouth daily.     aspirin EC 81 MG tablet  Take 81 mg by mouth daily.     atorvastatin 80 MG tablet  Commonly known as:  LIPITOR  Take 80 mg by mouth every evening.     azelastine 0.1 % nasal spray  Commonly known as:  ASTELIN  Place 1 spray into both nostrils 2 (two) times daily as needed for rhinitis or allergies. Use in each nostril as needed for allergy symptoms     fenofibrate 145 MG tablet  Commonly known as:  TRICOR  Take 145 mg by mouth  at bedtime.     hydrALAZINE 25 MG tablet  Commonly known as:  APRESOLINE  Take 25 mg by mouth 2 (two) times daily.     hydroxypropyl methylcellulose / hypromellose 2.5 % ophthalmic solution  Commonly known as:  ISOPTO TEARS / GONIOVISC  Place 1 drop into both eyes 4 (four) times daily.     lisinopril 40 MG tablet  Commonly known as:  PRINIVIL,ZESTRIL  Take 40 mg by mouth daily.     metoprolol succinate 50 MG 24 hr tablet  Commonly known as:  TOPROL-XL  Take 50 mg by mouth daily. Take with or immediately following a meal.     multivitamin tablet  Take 1 tablet by mouth daily.     nabumetone 500 MG tablet  Commonly known as:  RELAFEN  Take 500 mg by mouth 2 (two) times daily.     NAMENDA XR 28 MG Cp24 24 hr capsule  Generic drug:  memantine  Take 28 mg by mouth daily.     oxyCODONE-acetaminophen 5-325 MG tablet  Commonly known as:  PERCOCET/ROXICET  Take 1-2 tablets by mouth every 6 (six) hours as needed for moderate pain.     pantoprazole 40 MG tablet  Commonly known as:  PROTONIX  Take 40 mg by mouth daily.     tiZANidine 4 MG tablet  Commonly known as:  ZANAFLEX  Take 1 tablet (4 mg total) by mouth every 6 (six) hours as needed for muscle  spasms.     ULORIC 40 MG tablet  Generic drug:  febuxostat  Take 40 mg by mouth daily.         Signed: Camil Hausmann L 02/18/2016, 10:38 AM

## 2016-02-18 NOTE — Progress Notes (Signed)
Patient alert and oriented, mae's well, voiding adequate amount of urine, swallowing without difficulty, c/o mild pain. Patient discharged home with family. Script and discharged instructions given to patient. Patient and family stated understanding of instructions given.   

## 2016-02-20 ENCOUNTER — Encounter (HOSPITAL_COMMUNITY): Payer: Self-pay | Admitting: Neurological Surgery

## 2016-03-08 ENCOUNTER — Ambulatory Visit: Payer: Medicare Other | Admitting: Vascular Surgery

## 2016-03-08 ENCOUNTER — Ambulatory Visit (HOSPITAL_COMMUNITY)
Admission: RE | Admit: 2016-03-08 | Discharge: 2016-03-08 | Disposition: A | Discharge status: Home / Self Care | Payer: Medicare Other | Source: Ambulatory Visit | Attending: Vascular Surgery | Admitting: Vascular Surgery

## 2016-03-08 ENCOUNTER — Ambulatory Visit (INDEPENDENT_AMBULATORY_CARE_PROVIDER_SITE_OTHER)
Admission: RE | Admit: 2016-03-08 | Discharge: 2016-03-08 | Disposition: A | Discharge status: Home / Self Care | Payer: Medicare Other | Source: Ambulatory Visit | Attending: Vascular Surgery | Admitting: Vascular Surgery

## 2016-03-08 DIAGNOSIS — I739 Peripheral vascular disease, unspecified: Secondary | ICD-10-CM | POA: Diagnosis not present

## 2016-03-16 ENCOUNTER — Encounter: Payer: Self-pay | Admitting: Vascular Surgery

## 2016-03-22 ENCOUNTER — Encounter: Payer: Self-pay | Admitting: Vascular Surgery

## 2016-03-22 ENCOUNTER — Ambulatory Visit (INDEPENDENT_AMBULATORY_CARE_PROVIDER_SITE_OTHER): Payer: Medicare Other | Admitting: Vascular Surgery

## 2016-03-22 VITALS — BP 170/83 | HR 66 | Temp 97.5°F | Resp 18 | Ht 70.5 in | Wt 225.0 lb

## 2016-03-22 DIAGNOSIS — I739 Peripheral vascular disease, unspecified: Secondary | ICD-10-CM

## 2016-03-22 DIAGNOSIS — I6523 Occlusion and stenosis of bilateral carotid arteries: Secondary | ICD-10-CM | POA: Diagnosis not present

## 2016-03-22 NOTE — Progress Notes (Signed)
Patient is a 70 year old male who returns for follow-up today. He underwent left femoral to below-knee popliteal bypass in September 2016. He has also had a previous left carotid endarterectomy in 2011. He recently underwent back surgery several weeks ago. He is still recovering from this. He does not really describe any claudication symptoms in the right or left leg. He does have a known 70% stenosis of the mid right SFA. He denies rest pain. He has no nonhealing wounds. He denies any symptoms of TIA amaurosis or stroke. Chronic medical problems include hyperlipidemia, asthma, coronary artery disease, diabetes. All of these have been stable. He did quit smoking 1 month ago.  Past Medical History  Diagnosis Date  . Hyperlipidemia     takes Atorvastatin and Fenofibrate daily  . Carotid artery occlusion     with Claudication  . Asthma   . Vertigo   . Gout     takes Uloric daily  . CAD (coronary artery disease)   . Head injury     as a child  . Tuberculosis 2006     9 months   . Arthritis   . COPD (chronic obstructive pulmonary disease) (HCC)     Spirva daily and Albuterol as needed  . Hypertension     takes Lisinopril,Amlodipine,and Hydralazine daily  . GERD (gastroesophageal reflux disease)     takes Protonix daily  . Arrhythmia     takes metoprolol daily  . Chronic back pain     stenosis  . Numbness     lower left leg and upper right thigh  . Shortness of breath dyspnea     with exertion  . Pneumonia     hx of-couple of yrs ago  . History of hiatal hernia   . History of gastric ulcer   . History of colon polyps     benign  . Diabetes mellitus without complication (HCC)     Type 2 diet controlled.Never been on meds  . Impaired memory     takes Namenda daily    Current Outpatient Prescriptions on File Prior to Visit  Medication Sig Dispense Refill  . acetaminophen-codeine (TYLENOL #2) 300-15 MG tablet Take 1 tablet by mouth every 4 (four) hours as needed for moderate pain.  10 tablet 0  . amLODipine (NORVASC) 10 MG tablet Take 10 mg by mouth daily.    [name] aspirin EC 81 MG tablet Take 81 mg by mouth daily.    [name] atorvastatin (LIPITOR) 80 MG tablet Take 80 mg by mouth every evening.     [name] azelastine (ASTELIN) 137 MCG/SPRAY nasal spray Place 1 spray into both nostrils 2 (two) times daily as needed for rhinitis or allergies. Use in each nostril as needed for allergy symptoms    . fenofibrate (TRICOR) 145 MG tablet Take 145 mg by mouth at bedtime.     . hydrALAZINE (APRESOLINE) 25 MG tablet Take 25 mg by mouth 2 (two) times daily.  0  . hydroxypropyl methylcellulose / hypromellose (ISOPTO TEARS / GONIOVISC) 2.5 % ophthalmic solution Place 1 drop into both eyes 4 (four) times daily.    [name] lisinopril (PRINIVIL,ZESTRIL) 40 MG tablet Take 40 mg by mouth daily.    . memantine (NAMENDA XR) 28 MG CP24 24 hr capsule Take 28 mg by mouth daily.    . metoprolol succinate (TOPROL-XL) 50 MG 24 hr tablet Take 50 mg by mouth daily. Take with or immediately following a meal.    . Multiple Vitamin (MULTIVITAMIN) tablet Take 1  tablet by mouth daily.    . nabumetone (RELAFEN) 500 MG tablet Take 500 mg by mouth 2 (two) times daily.   1  . pantoprazole (PROTONIX) 40 MG tablet Take 40 mg by mouth daily.  12  . ULORIC 40 MG tablet Take 40 mg by mouth daily.  2   No current facility-administered medications on file prior to visit.    Past Surgical History  Procedure Laterality Date  . Carotid endarterectomy  Nov. 15,2011    LEFT cea  . Joint replacement  1980    RIGHT  knee  . Stents  Aug.  23, 1999    Bilateral iliofemoral  stents, [place].  . Peripheral vascular catheterization N/A 06/24/2015    Procedure: Abdominal Aortogram;  Surgeon: [name], MD;  Location: [place];  Service: Cardiovascular;  Laterality: N/A;  . Femoral-popliteal bypass graft Left 08/23/2015    Procedure: LEFT FEMORAL-POPLITEAL ARTERY BYPASS WITH GORETEX GRAFT;  Surgeon: [name], MD;   Location: [place];  Service: Vascular;  Laterality: Left;  . Colonoscopy    . Carotid body tumor excision Left 09/21/2015    Procedure: EXCISE LEFT NECK NODULE WITH LOCAL ;  Surgeon: [name], MD;  Location: [facility];  Service: Vascular;  Laterality: Left;  . Lumbar epidural injection    . Cataract surgery Bilateral   . Lumbar laminectomy/decompression microdiscectomy Right 02/17/2016    Procedure: Laminectomy and Foraminotomy - Lumbar four-five right;  Surgeon: [name], MD;  Location: [facility];  Service: Neurosurgery;  Laterality: Right;  right    Review of systems: He denies shortness of breath. He denies chest pain.  Physical exam:  Filed Vitals:   03/22/16 1109  BP: 170/83  Pulse: 66  Temp: 97.5 F (36.4 C)  TempSrc: Oral  Resp: 18  Height: 5' 10.5" (1.791 m)  Weight: 225 lb (102.059 kg)  SpO2: 97%    Extremities: No palpable pedal pulses feet pink warm bilaterally incisions left leg well-healed Chest: Clear to auscultation bilaterally Neck: Left-sided carotid bruit no right bruit. Cardiac: Regular rate and rhythm without murmur  Data: Patient had a duplex ultrasound of his left leg bypass graft on 03/08/2016. This showed a patent bypass graft with ABI of 0.97 right side was 0.96. There is no internal narrowing or stenosis.  Assessment: Patent left leg bypass no claudication symptoms right or left leg at this point. No carotid symptoms currently.  Plan: Patient will follow-up in September 2017 with carotid duplex graft duplex of his bypass and bilateral ABIs.  Ruta Hinds, MD [facility] Office: [phone] Pager: [phone]

## 2016-04-18 NOTE — Addendum Note (Signed)
Addended by: Mena Goes on: 04/18/2016 12:49 PM   Modules accepted: Orders

## 2016-05-08 DIAGNOSIS — M109 Gout, unspecified: Secondary | ICD-10-CM | POA: Diagnosis not present

## 2016-05-08 DIAGNOSIS — N183 Chronic kidney disease, stage 3 (moderate): Secondary | ICD-10-CM | POA: Diagnosis not present

## 2016-05-08 DIAGNOSIS — E669 Obesity, unspecified: Secondary | ICD-10-CM | POA: Diagnosis not present

## 2016-05-08 DIAGNOSIS — Z6832 Body mass index (BMI) 32.0-32.9, adult: Secondary | ICD-10-CM | POA: Diagnosis not present

## 2016-05-08 DIAGNOSIS — E785 Hyperlipidemia, unspecified: Secondary | ICD-10-CM | POA: Diagnosis not present

## 2016-05-08 DIAGNOSIS — E1122 Type 2 diabetes mellitus with diabetic chronic kidney disease: Secondary | ICD-10-CM | POA: Diagnosis not present

## 2016-05-08 DIAGNOSIS — I10 Essential (primary) hypertension: Secondary | ICD-10-CM | POA: Diagnosis not present

## 2016-05-10 DIAGNOSIS — E1122 Type 2 diabetes mellitus with diabetic chronic kidney disease: Secondary | ICD-10-CM | POA: Diagnosis not present

## 2016-06-08 DIAGNOSIS — E1129 Type 2 diabetes mellitus with other diabetic kidney complication: Secondary | ICD-10-CM | POA: Diagnosis not present

## 2016-06-08 DIAGNOSIS — N183 Chronic kidney disease, stage 3 (moderate): Secondary | ICD-10-CM | POA: Diagnosis not present

## 2016-06-08 DIAGNOSIS — N2581 Secondary hyperparathyroidism of renal origin: Secondary | ICD-10-CM | POA: Diagnosis not present

## 2016-06-08 DIAGNOSIS — E785 Hyperlipidemia, unspecified: Secondary | ICD-10-CM | POA: Diagnosis not present

## 2016-06-08 DIAGNOSIS — I6509 Occlusion and stenosis of unspecified vertebral artery: Secondary | ICD-10-CM | POA: Diagnosis not present

## 2016-06-08 DIAGNOSIS — I739 Peripheral vascular disease, unspecified: Secondary | ICD-10-CM | POA: Diagnosis not present

## 2016-06-08 DIAGNOSIS — G309 Alzheimer's disease, unspecified: Secondary | ICD-10-CM | POA: Diagnosis not present

## 2016-06-08 DIAGNOSIS — I129 Hypertensive chronic kidney disease with stage 1 through stage 4 chronic kidney disease, or unspecified chronic kidney disease: Secondary | ICD-10-CM | POA: Diagnosis not present

## 2016-06-08 DIAGNOSIS — N184 Chronic kidney disease, stage 4 (severe): Secondary | ICD-10-CM | POA: Diagnosis not present

## 2016-06-08 DIAGNOSIS — D631 Anemia in chronic kidney disease: Secondary | ICD-10-CM | POA: Diagnosis not present

## 2016-06-08 DIAGNOSIS — M109 Gout, unspecified: Secondary | ICD-10-CM | POA: Diagnosis not present

## 2016-06-08 DIAGNOSIS — I6529 Occlusion and stenosis of unspecified carotid artery: Secondary | ICD-10-CM | POA: Diagnosis not present

## 2016-06-08 DIAGNOSIS — F0280 Dementia in other diseases classified elsewhere without behavioral disturbance: Secondary | ICD-10-CM | POA: Diagnosis not present

## 2016-06-11 ENCOUNTER — Other Ambulatory Visit: Payer: Self-pay | Admitting: Nephrology

## 2016-06-11 DIAGNOSIS — N184 Chronic kidney disease, stage 4 (severe): Secondary | ICD-10-CM | POA: Diagnosis not present

## 2016-06-11 DIAGNOSIS — I159 Secondary hypertension, unspecified: Secondary | ICD-10-CM

## 2016-06-21 ENCOUNTER — Ambulatory Visit
Admission: RE | Admit: 2016-06-21 | Discharge: 2016-06-21 | Disposition: A | Discharge status: Home / Self Care | Payer: Medicare Other | Source: Ambulatory Visit | Attending: Nephrology | Admitting: Nephrology

## 2016-06-21 DIAGNOSIS — I159 Secondary hypertension, unspecified: Secondary | ICD-10-CM

## 2016-06-21 DIAGNOSIS — N184 Chronic kidney disease, stage 4 (severe): Secondary | ICD-10-CM

## 2016-06-22 DIAGNOSIS — D509 Iron deficiency anemia, unspecified: Secondary | ICD-10-CM | POA: Diagnosis not present

## 2016-07-16 DIAGNOSIS — N184 Chronic kidney disease, stage 4 (severe): Secondary | ICD-10-CM | POA: Diagnosis not present

## 2016-07-16 DIAGNOSIS — N2581 Secondary hyperparathyroidism of renal origin: Secondary | ICD-10-CM | POA: Diagnosis not present

## 2016-07-16 DIAGNOSIS — I6529 Occlusion and stenosis of unspecified carotid artery: Secondary | ICD-10-CM | POA: Diagnosis not present

## 2016-07-16 DIAGNOSIS — D631 Anemia in chronic kidney disease: Secondary | ICD-10-CM | POA: Diagnosis not present

## 2016-07-16 DIAGNOSIS — I739 Peripheral vascular disease, unspecified: Secondary | ICD-10-CM | POA: Diagnosis not present

## 2016-07-16 DIAGNOSIS — G309 Alzheimer's disease, unspecified: Secondary | ICD-10-CM | POA: Diagnosis not present

## 2016-07-16 DIAGNOSIS — M109 Gout, unspecified: Secondary | ICD-10-CM | POA: Diagnosis not present

## 2016-07-16 DIAGNOSIS — I6509 Occlusion and stenosis of unspecified vertebral artery: Secondary | ICD-10-CM | POA: Diagnosis not present

## 2016-07-16 DIAGNOSIS — E669 Obesity, unspecified: Secondary | ICD-10-CM | POA: Diagnosis not present

## 2016-07-16 DIAGNOSIS — E785 Hyperlipidemia, unspecified: Secondary | ICD-10-CM | POA: Diagnosis not present

## 2016-07-16 DIAGNOSIS — I129 Hypertensive chronic kidney disease with stage 1 through stage 4 chronic kidney disease, or unspecified chronic kidney disease: Secondary | ICD-10-CM | POA: Diagnosis not present

## 2016-07-16 DIAGNOSIS — E1129 Type 2 diabetes mellitus with other diabetic kidney complication: Secondary | ICD-10-CM | POA: Diagnosis not present

## 2016-07-19 DIAGNOSIS — R195 Other fecal abnormalities: Secondary | ICD-10-CM | POA: Diagnosis not present

## 2016-07-25 DIAGNOSIS — K5730 Diverticulosis of large intestine without perforation or abscess without bleeding: Secondary | ICD-10-CM | POA: Diagnosis not present

## 2016-07-25 DIAGNOSIS — R195 Other fecal abnormalities: Secondary | ICD-10-CM | POA: Diagnosis not present

## 2016-07-25 DIAGNOSIS — D124 Benign neoplasm of descending colon: Secondary | ICD-10-CM | POA: Diagnosis not present

## 2016-07-25 DIAGNOSIS — Z86010 Personal history of colonic polyps: Secondary | ICD-10-CM | POA: Diagnosis not present

## 2016-07-25 DIAGNOSIS — K635 Polyp of colon: Secondary | ICD-10-CM | POA: Diagnosis not present

## 2016-07-25 DIAGNOSIS — Z791 Long term (current) use of non-steroidal anti-inflammatories (NSAID): Secondary | ICD-10-CM | POA: Diagnosis not present

## 2016-07-25 DIAGNOSIS — D125 Benign neoplasm of sigmoid colon: Secondary | ICD-10-CM | POA: Diagnosis not present

## 2016-08-14 DIAGNOSIS — D509 Iron deficiency anemia, unspecified: Secondary | ICD-10-CM | POA: Diagnosis not present

## 2016-08-14 DIAGNOSIS — N184 Chronic kidney disease, stage 4 (severe): Secondary | ICD-10-CM | POA: Diagnosis not present

## 2016-08-14 DIAGNOSIS — E1122 Type 2 diabetes mellitus with diabetic chronic kidney disease: Secondary | ICD-10-CM | POA: Diagnosis not present

## 2016-08-14 DIAGNOSIS — I129 Hypertensive chronic kidney disease with stage 1 through stage 4 chronic kidney disease, or unspecified chronic kidney disease: Secondary | ICD-10-CM | POA: Diagnosis not present

## 2016-08-14 DIAGNOSIS — E785 Hyperlipidemia, unspecified: Secondary | ICD-10-CM | POA: Diagnosis not present

## 2016-08-14 DIAGNOSIS — M109 Gout, unspecified: Secondary | ICD-10-CM | POA: Diagnosis not present

## 2016-08-14 DIAGNOSIS — Z125 Encounter for screening for malignant neoplasm of prostate: Secondary | ICD-10-CM | POA: Diagnosis not present

## 2016-08-30 DIAGNOSIS — M5416 Radiculopathy, lumbar region: Secondary | ICD-10-CM | POA: Diagnosis not present

## 2016-08-30 DIAGNOSIS — Z6831 Body mass index (BMI) 31.0-31.9, adult: Secondary | ICD-10-CM | POA: Diagnosis not present

## 2016-08-30 DIAGNOSIS — M4727 Other spondylosis with radiculopathy, lumbosacral region: Secondary | ICD-10-CM | POA: Diagnosis not present

## 2016-08-30 DIAGNOSIS — I10 Essential (primary) hypertension: Secondary | ICD-10-CM | POA: Diagnosis not present

## 2016-09-10 DIAGNOSIS — E113293 Type 2 diabetes mellitus with mild nonproliferative diabetic retinopathy without macular edema, bilateral: Secondary | ICD-10-CM | POA: Diagnosis not present

## 2016-09-18 ENCOUNTER — Encounter: Payer: Self-pay | Admitting: Vascular Surgery

## 2016-09-20 ENCOUNTER — Ambulatory Visit (INDEPENDENT_AMBULATORY_CARE_PROVIDER_SITE_OTHER)
Admission: RE | Admit: 2016-09-20 | Discharge: 2016-09-20 | Disposition: A | Discharge status: Home / Self Care | Payer: Medicare Other | Source: Ambulatory Visit | Attending: Vascular Surgery | Admitting: Vascular Surgery

## 2016-09-20 ENCOUNTER — Ambulatory Visit (INDEPENDENT_AMBULATORY_CARE_PROVIDER_SITE_OTHER): Payer: Medicare Other | Admitting: Vascular Surgery

## 2016-09-20 ENCOUNTER — Encounter: Payer: Self-pay | Admitting: Vascular Surgery

## 2016-09-20 ENCOUNTER — Ambulatory Visit (HOSPITAL_COMMUNITY)
Admission: RE | Admit: 2016-09-20 | Discharge: 2016-09-20 | Disposition: A | Discharge status: Home / Self Care | Payer: Medicare Other | Source: Ambulatory Visit | Attending: Vascular Surgery | Admitting: Vascular Surgery

## 2016-09-20 VITALS — BP 189/84 | HR 73 | Temp 97.9°F | Resp 16 | Ht 70.0 in | Wt 221.0 lb

## 2016-09-20 DIAGNOSIS — I739 Peripheral vascular disease, unspecified: Secondary | ICD-10-CM

## 2016-09-20 DIAGNOSIS — I6521 Occlusion and stenosis of right carotid artery: Secondary | ICD-10-CM | POA: Insufficient documentation

## 2016-09-20 DIAGNOSIS — Z95828 Presence of other vascular implants and grafts: Secondary | ICD-10-CM | POA: Insufficient documentation

## 2016-09-20 DIAGNOSIS — I6523 Occlusion and stenosis of bilateral carotid arteries: Secondary | ICD-10-CM

## 2016-09-20 NOTE — Progress Notes (Signed)
Patient is a 70 year old male who returns for follow-up today. He underwent left femoral to below-knee popliteal bypass in September 2016. He has also had a previous left carotid endarterectomy in 2011.  He does not really describe any claudication symptoms in the right or left leg. He does have a known 70% stenosis of the mid right SFA. He denies rest pain. He has no nonhealing wounds. He denies any symptoms of TIA amaurosis or stroke. He is able to walk about a half a mile before experiencing any fatigue in his legs. This occurs primarily in his hips. He was only able to walk 200 feet preoperatively. He still has some occasional numbness and tingling in the anterior aspect of his left calf which has been present since his operation. Chronic medical problems include hyperlipidemia, asthma, coronary artery disease, diabetes. All of these have been stable. He did quit smoking.        Past Medical History  Diagnosis Date  . Hyperlipidemia        takes Atorvastatin and Fenofibrate daily  . Carotid artery occlusion        with Claudication  . Asthma    . Vertigo    . Gout        takes Uloric daily  . CAD (coronary artery disease)    . Head injury        as a child  . Tuberculosis 2006       9 months   . Arthritis    . COPD (chronic obstructive pulmonary disease) (HCC)        Spirva daily and Albuterol as needed  . Hypertension        takes Lisinopril,Amlodipine,and Hydralazine daily  . GERD (gastroesophageal reflux disease)        takes Protonix daily  . Arrhythmia        takes metoprolol daily  . Chronic back pain        stenosis  . Numbness        lower left leg and upper right thigh  . Shortness of breath dyspnea        with exertion  . Pneumonia        hx of-couple of yrs ago  . History of hiatal hernia    . History of gastric ulcer    . History of colon polyps        benign  . Diabetes mellitus without complication (HCC)        Type 2 diet controlled.Never been on meds  .  Impaired memory        takes Namenda daily            Current Outpatient Prescriptions on File Prior to Visit  Medication Sig Dispense Refill  . acetaminophen-codeine (TYLENOL #2) 300-15 MG tablet Take 1 tablet by mouth every 4 (four) hours as needed for moderate pain. 10 tablet 0  . amLODipine (NORVASC) 10 MG tablet Take 10 mg by mouth daily.      [name] aspirin EC 81 MG tablet Take 81 mg by mouth daily.      [name] atorvastatin (LIPITOR) 80 MG tablet Take 80 mg by mouth every evening.       [name] azelastine (ASTELIN) 137 MCG/SPRAY nasal spray Place 1 spray into both nostrils 2 (two) times daily as needed for rhinitis or allergies. Use in each nostril as needed for allergy symptoms      . fenofibrate (TRICOR) 145 MG tablet Take 145 mg by mouth at bedtime.       [name]  hydrALAZINE (APRESOLINE) 25 MG tablet Take 25 mg by mouth 2 (two) times daily.   0  . hydroxypropyl methylcellulose / hypromellose (ISOPTO TEARS / GONIOVISC) 2.5 % ophthalmic solution Place 1 drop into both eyes 4 (four) times daily.      [name] lisinopril (PRINIVIL,ZESTRIL) 40 MG tablet Take 40 mg by mouth daily.      . memantine (NAMENDA XR) 28 MG CP24 24 hr capsule Take 28 mg by mouth daily.      . metoprolol succinate (TOPROL-XL) 50 MG 24 hr tablet Take 50 mg by mouth daily. Take with or immediately following a meal.      . Multiple Vitamin (MULTIVITAMIN) tablet Take 1 tablet by mouth daily.      . nabumetone (RELAFEN) 500 MG tablet Take 500 mg by mouth 2 (two) times daily.    1  . pantoprazole (PROTONIX) 40 MG tablet Take 40 mg by mouth daily.   12  . ULORIC 40 MG tablet Take 40 mg by mouth daily.   2    No current facility-administered medications on file prior to visit.            Past Surgical History  Procedure Laterality Date  . Carotid endarterectomy   Nov. 15,2011      LEFT cea  . Joint replacement   1980      RIGHT  knee  . Stents   Aug.  23, 1999      Bilateral iliofemoral  stents, [place].  . Peripheral vascular  catheterization N/A 06/24/2015      Procedure: Abdominal Aortogram;  Surgeon: [name], MD;  Location: [place];  Service: Cardiovascular;  Laterality: N/A;  . Femoral-popliteal bypass graft Left 08/23/2015      Procedure: LEFT FEMORAL-POPLITEAL ARTERY BYPASS WITH GORETEX GRAFT;  Surgeon: [name], MD;  Location: [facility];  Service: Vascular;  Laterality: Left;  . Colonoscopy      . Carotid body tumor excision Left 09/21/2015      Procedure: EXCISE LEFT NECK NODULE WITH LOCAL ;  Surgeon: [name], MD;  Location: [facility];  Service: Vascular;  Laterality: Left;  . Lumbar epidural injection      . Cataract surgery Bilateral    . Lumbar laminectomy/decompression microdiscectomy Right 02/17/2016      Procedure: Laminectomy and Foraminotomy - Lumbar four-five right;  Surgeon: [name], MD;  Location: [place] [facility];  Service: Neurosurgery;  Laterality: Right;  right      Review of systems: He denies shortness of breath. He denies chest pain.   Physical exam:    Vitals:   09/20/16 1228 09/20/16 1234  BP: (!) 180/84 (!) 189/84  Pulse: 72 73  Resp: 16   Temp: 97.9 F (36.6 C)   TempSrc: Oral   SpO2: 97%   Weight: 221 lb (100.2 kg)   Height: 5\' 10"  (1.778 m)      Extremities: No palpable pedal pulses feet pink warm bilaterally incisions left leg well-healed Chest: Clear to auscultation bilaterally Neck: Left-sided carotid bruit no right bruit. Cardiac: Regular rate and rhythm without murmur   Data: Patient had a duplex ultrasound of his left leg bypass graft on 09/20/2016. This showed a patent bypass graft in the left leg with no significant narrowing, ABI of 0.85 right side left side was 0.75. There is no internal narrowing or stenosis.  Carotid duplex scan showed 40-60% right internal carotid artery stenosis widely patent left carotid endarterectomy  Assessment: Patent left leg bypass no claudication symptoms right or left leg at this point. No carotid  symptoms currently.   Plan: Patient will follow-up in September 2018 with carotid duplex graft duplex of his bypass and bilateral ABIs.   Ruta Hinds, MD [facility] Office: [phone] Pager: [phone]

## 2016-09-21 NOTE — Addendum Note (Signed)
Addended by: Kaleen Mask on: 09/21/2016 04:48 PM   Modules accepted: Orders

## 2016-10-03 DIAGNOSIS — L578 Other skin changes due to chronic exposure to nonionizing radiation: Secondary | ICD-10-CM | POA: Diagnosis not present

## 2016-10-03 DIAGNOSIS — D485 Neoplasm of uncertain behavior of skin: Secondary | ICD-10-CM | POA: Diagnosis not present

## 2016-10-03 DIAGNOSIS — B079 Viral wart, unspecified: Secondary | ICD-10-CM | POA: Diagnosis not present

## 2016-10-03 DIAGNOSIS — L820 Inflamed seborrheic keratosis: Secondary | ICD-10-CM | POA: Diagnosis not present

## 2016-10-17 DIAGNOSIS — M4727 Other spondylosis with radiculopathy, lumbosacral region: Secondary | ICD-10-CM | POA: Diagnosis not present

## 2016-10-17 DIAGNOSIS — Z6831 Body mass index (BMI) 31.0-31.9, adult: Secondary | ICD-10-CM | POA: Diagnosis not present

## 2016-10-22 DIAGNOSIS — E785 Hyperlipidemia, unspecified: Secondary | ICD-10-CM | POA: Diagnosis not present

## 2016-10-22 DIAGNOSIS — I6529 Occlusion and stenosis of unspecified carotid artery: Secondary | ICD-10-CM | POA: Diagnosis not present

## 2016-10-22 DIAGNOSIS — I6509 Occlusion and stenosis of unspecified vertebral artery: Secondary | ICD-10-CM | POA: Diagnosis not present

## 2016-10-22 DIAGNOSIS — N184 Chronic kidney disease, stage 4 (severe): Secondary | ICD-10-CM | POA: Diagnosis not present

## 2016-10-22 DIAGNOSIS — N2581 Secondary hyperparathyroidism of renal origin: Secondary | ICD-10-CM | POA: Diagnosis not present

## 2016-10-22 DIAGNOSIS — E1129 Type 2 diabetes mellitus with other diabetic kidney complication: Secondary | ICD-10-CM | POA: Diagnosis not present

## 2016-10-22 DIAGNOSIS — I739 Peripheral vascular disease, unspecified: Secondary | ICD-10-CM | POA: Diagnosis not present

## 2016-10-22 DIAGNOSIS — D631 Anemia in chronic kidney disease: Secondary | ICD-10-CM | POA: Diagnosis not present

## 2016-10-22 DIAGNOSIS — I129 Hypertensive chronic kidney disease with stage 1 through stage 4 chronic kidney disease, or unspecified chronic kidney disease: Secondary | ICD-10-CM | POA: Diagnosis not present

## 2016-10-22 DIAGNOSIS — E669 Obesity, unspecified: Secondary | ICD-10-CM | POA: Diagnosis not present

## 2016-10-22 DIAGNOSIS — N183 Chronic kidney disease, stage 3 (moderate): Secondary | ICD-10-CM | POA: Diagnosis not present

## 2016-10-22 DIAGNOSIS — G309 Alzheimer's disease, unspecified: Secondary | ICD-10-CM | POA: Diagnosis not present

## 2016-10-22 DIAGNOSIS — M109 Gout, unspecified: Secondary | ICD-10-CM | POA: Diagnosis not present

## 2016-10-26 ENCOUNTER — Other Ambulatory Visit: Payer: Self-pay | Admitting: Neurological Surgery

## 2016-10-29 ENCOUNTER — Telehealth: Payer: Self-pay | Admitting: Vascular Surgery

## 2016-10-29 ENCOUNTER — Other Ambulatory Visit: Payer: Self-pay

## 2016-10-29 NOTE — Telephone Encounter (Signed)
Sched appt 11/07/16 at 12:15. Spoke to pt to inform them of appt.

## 2016-10-29 NOTE — Telephone Encounter (Signed)
-----   Message from Gregery Na, RN sent at 10/29/2016 10:11 AM EST ----- Regarding: OV with [facility] Mr. Fread is scheduled for ALIF L4-5 with Drs. [name] / Ditty on 11/29/16. Will you please schedule patient an OV with Dr. [name] at least 2 weeks prior to his surgery? Also, please remind patient to bring LS spine films to the appt.  Thanks,  Colletta [place]

## 2016-10-31 ENCOUNTER — Encounter: Payer: Self-pay | Admitting: Vascular Surgery

## 2016-11-07 ENCOUNTER — Encounter: Payer: Self-pay | Admitting: Vascular Surgery

## 2016-11-07 ENCOUNTER — Ambulatory Visit (INDEPENDENT_AMBULATORY_CARE_PROVIDER_SITE_OTHER): Payer: Medicare Other | Admitting: Vascular Surgery

## 2016-11-07 VITALS — BP 146/80 | HR 62 | Temp 97.9°F | Resp 18 | Ht 69.5 in | Wt 220.0 lb

## 2016-11-07 DIAGNOSIS — M5136 Other intervertebral disc degeneration, lumbar region: Secondary | ICD-10-CM | POA: Diagnosis not present

## 2016-11-07 NOTE — Progress Notes (Signed)
Patient ID: DONTAY HARM, male   DOB: 03/08/1946, 70 y.o.   MRN: [id number]  Reason for Consult: New Evaluation (ALIF)   Referred by Ditty, Kevan Ny, *  Subjective:     HPI:  Joseph Hebert is a 70 y.o. male with history of left lower extremity bypass and bilateral common iliac artery stents. He is now indicated for spinal surgery for back pain. He has not had any issues with his bypass surgeries and remains on aspirin. Today in the office his only complaint is his back pain.  Past Medical History:  Diagnosis Date  . Arrhythmia    takes metoprolol daily  . Arthritis   . Asthma   . CAD (coronary artery disease)   . Carotid artery occlusion    with Claudication  . Chronic back pain    stenosis  . COPD (chronic obstructive pulmonary disease) (HCC)    Spirva daily and Albuterol as needed  . Diabetes mellitus without complication (HCC)    Type 2 diet controlled.Never been on meds  . GERD (gastroesophageal reflux disease)    takes Protonix daily  . Gout    takes Uloric daily  . Head injury    as a child  . History of colon polyps    benign  . History of gastric ulcer   . History of hiatal hernia   . Hyperlipidemia    takes Atorvastatin and Fenofibrate daily  . Hypertension    takes Lisinopril,Amlodipine,and Hydralazine daily  . Impaired memory    takes Namenda daily  . Numbness    lower left leg and upper right thigh  . Pneumonia    hx of-couple of yrs ago  . Shortness of breath dyspnea    with exertion  . Tuberculosis 2006    9 months   . Vertigo    Family History  Problem Relation Age of Onset  . Diabetes Mother   . Hyperlipidemia Father   . Hypertension Father   . Other Father     Right Leg Amputation  . Heart disease Sister     Aneyrism    Past Surgical History:  Procedure Laterality Date  . CAROTID BODY TUMOR EXCISION Left 09/21/2015   Procedure: EXCISE LEFT NECK NODULE WITH LOCAL ;  Surgeon: [name], MD;  Location: [facility];  Service:  Vascular;  Laterality: Left;  . CAROTID ENDARTERECTOMY  Nov. 15,2011   LEFT cea  . cataract surgery Bilateral   . COLONOSCOPY    . FEMORAL-POPLITEAL BYPASS GRAFT Left 08/23/2015   Procedure: LEFT FEMORAL-POPLITEAL ARTERY BYPASS WITH GORETEX GRAFT;  Surgeon: [name], MD;  Location: [facility];  Service: Vascular;  Laterality: Left;  . JOINT REPLACEMENT  1980   RIGHT  knee  . LUMBAR EPIDURAL INJECTION    . LUMBAR LAMINECTOMY/DECOMPRESSION MICRODISCECTOMY Right 02/17/2016   Procedure: Laminectomy and Foraminotomy - Lumbar four-five right;  Surgeon: [name], MD;  Location: [facility];  Service: Neurosurgery;  Laterality: Right;  right  . PERIPHERAL VASCULAR CATHETERIZATION N/A 06/24/2015   Procedure: Abdominal Aortogram;  Surgeon: [name], MD;  Location: [facility];  Service: Cardiovascular;  Laterality: N/A;  . Stents  Aug.  23, 1999   Bilateral iliofemoral  stents, [place].    Short Social History:  Social History  Substance Use Topics  . Smoking status: Former Smoker    Packs/day: 0.30    Types: Cigarettes  . Smokeless tobacco: Never Used  Comment: hasn't smoked in 3 days  . Alcohol use No    No Known Allergies  Current Outpatient Prescriptions  Medication Sig Dispense Refill  . amLODipine (NORVASC) 10 MG tablet Take 10 mg by mouth daily.    [name] aspirin EC 81 MG tablet Take 81 mg by mouth daily.    [name] atorvastatin (LIPITOR) 80 MG tablet Take 80 mg by mouth every evening.     . fenofibrate (TRICOR) 145 MG tablet Take 145 mg by mouth at bedtime.     . furosemide (LASIX) 40 MG tablet Take 40 mg by mouth daily.    . hydrALAZINE (APRESOLINE) 25 MG tablet Take 25 mg by mouth 2 (two) times daily.  0  . lisinopril (PRINIVIL,ZESTRIL) 40 MG tablet Take 40 mg by mouth daily.    . memantine (NAMENDA XR) 28 MG CP24 24 hr capsule Take 28 mg by mouth daily.    . metoprolol succinate (TOPROL-XL) 50 MG 24 hr tablet Take 50 mg by mouth daily. Take with or  immediately following a meal.    . ULORIC 40 MG tablet Take 40 mg by mouth daily.  2  . acetaminophen-codeine (TYLENOL #2) 300-15 MG tablet Take 1 tablet by mouth every 4 (four) hours as needed for moderate pain. (Patient not taking: Reported on 11/07/2016) 10 tablet 0  . azelastine (ASTELIN) 137 MCG/SPRAY nasal spray Place 1 spray into both nostrils 2 (two) times daily as needed for rhinitis or allergies. Use in each nostril as needed for allergy symptoms    . DOCOSAHEXAENOIC ACID PO Take by mouth.    . hydroxypropyl methylcellulose / hypromellose (ISOPTO TEARS / GONIOVISC) 2.5 % ophthalmic solution Place 1 drop into both eyes 4 (four) times daily.    . Influenza Vac Split High-Dose 0.5 ML SUSY     . Multiple Vitamin (MULTIVITAMIN) tablet Take 1 tablet by mouth daily.    . nabumetone (RELAFEN) 500 MG tablet Take 500 mg by mouth 2 (two) times daily.   1  . pantoprazole (PROTONIX) 40 MG tablet Take 40 mg by mouth daily.  12   No current facility-administered medications for this visit.     Review of Systems  Constitutional:  Constitutional negative. HENT: HENT negative.  Eyes: Eyes negative.  Respiratory: Respiratory negative.  Cardiovascular: Cardiovascular negative.  GI: Gastrointestinal negative.  Musculoskeletal: Positive for back pain.  Skin: Skin negative.  Neurological: Neurological negative. Hematologic: Hematologic/lymphatic negative.  Psychiatric: Psychiatric negative.        Objective:  Objective   Vitals:   11/07/16 1207 11/07/16 1209  BP: (!) 154/74 (!) 146/80  Pulse: 66 62  Resp: 18   Temp: 97.9 F (36.6 C)   SpO2: 97%   Weight: 220 lb (99.8 kg)   Height: 5' 9.5" (1.765 m)    Body mass index is 32.02 kg/m.  Physical Exam  Constitutional: He is oriented to person, place, and time. He appears well-developed.  HENT:  Head: Atraumatic.  Neck: Normal range of motion.  Cardiovascular: Normal rate.   2+ femoral pulses  Pulmonary/Chest: Breath sounds normal.    Abdominal: Bowel sounds are normal.  Musculoskeletal: Normal range of motion. He exhibits no edema.  Neurological: He is alert and oriented to person, place, and time.  Skin: Skin is warm and dry.  Psychiatric: He has a normal mood and affect. His behavior is normal. Judgment and thought content normal.    Data: MRI reviewed.     Assessment/Plan:     70 year old white male  known to practice followed by Dr. [name] for carotid and lower extremity are 20 disease. He is now indicated for exposure for spine surgery. I discussed with him the difficulty given his history of bilateral common iliac artery stents. He has recently quit smoking and I commended him on this. From my standpoint he should continue aspirin although if Dr. Cyndy Freeze request otherwise that is okay. He will be at higher risk for an L4-5 exposure given the stenting and consultation was aorta he understands this and is willing to proceed surgery schedule for 11/29/2016.     [name] MD [facility]

## 2016-11-20 DIAGNOSIS — M109 Gout, unspecified: Secondary | ICD-10-CM | POA: Diagnosis not present

## 2016-11-20 DIAGNOSIS — I129 Hypertensive chronic kidney disease with stage 1 through stage 4 chronic kidney disease, or unspecified chronic kidney disease: Secondary | ICD-10-CM | POA: Diagnosis not present

## 2016-11-20 DIAGNOSIS — D509 Iron deficiency anemia, unspecified: Secondary | ICD-10-CM | POA: Diagnosis not present

## 2016-11-20 DIAGNOSIS — Z9181 History of falling: Secondary | ICD-10-CM | POA: Diagnosis not present

## 2016-11-20 DIAGNOSIS — Z1389 Encounter for screening for other disorder: Secondary | ICD-10-CM | POA: Diagnosis not present

## 2016-11-20 DIAGNOSIS — E1122 Type 2 diabetes mellitus with diabetic chronic kidney disease: Secondary | ICD-10-CM | POA: Diagnosis not present

## 2016-11-20 DIAGNOSIS — E785 Hyperlipidemia, unspecified: Secondary | ICD-10-CM | POA: Diagnosis not present

## 2016-11-20 DIAGNOSIS — N184 Chronic kidney disease, stage 4 (severe): Secondary | ICD-10-CM | POA: Diagnosis not present

## 2016-11-20 DIAGNOSIS — Z6831 Body mass index (BMI) 31.0-31.9, adult: Secondary | ICD-10-CM | POA: Diagnosis not present

## 2016-11-20 DIAGNOSIS — Z23 Encounter for immunization: Secondary | ICD-10-CM | POA: Diagnosis not present

## 2016-11-21 ENCOUNTER — Encounter (HOSPITAL_COMMUNITY): Payer: Self-pay

## 2016-11-21 ENCOUNTER — Encounter (HOSPITAL_COMMUNITY)
Admission: RE | Admit: 2016-11-21 | Discharge: 2016-11-21 | Disposition: A | Discharge status: Home / Self Care | Payer: Medicare Other | Source: Ambulatory Visit | Attending: Neurological Surgery | Admitting: Neurological Surgery

## 2016-11-21 ENCOUNTER — Other Ambulatory Visit: Payer: Self-pay

## 2016-11-21 DIAGNOSIS — F329 Major depressive disorder, single episode, unspecified: Secondary | ICD-10-CM | POA: Diagnosis not present

## 2016-11-21 DIAGNOSIS — E785 Hyperlipidemia, unspecified: Secondary | ICD-10-CM | POA: Insufficient documentation

## 2016-11-21 DIAGNOSIS — M4727 Other spondylosis with radiculopathy, lumbosacral region: Secondary | ICD-10-CM | POA: Diagnosis not present

## 2016-11-21 DIAGNOSIS — I6529 Occlusion and stenosis of unspecified carotid artery: Secondary | ICD-10-CM | POA: Diagnosis not present

## 2016-11-21 DIAGNOSIS — I70219 Atherosclerosis of native arteries of extremities with intermittent claudication, unspecified extremity: Secondary | ICD-10-CM | POA: Diagnosis not present

## 2016-11-21 DIAGNOSIS — N183 Chronic kidney disease, stage 3 (moderate): Secondary | ICD-10-CM | POA: Diagnosis not present

## 2016-11-21 DIAGNOSIS — I129 Hypertensive chronic kidney disease with stage 1 through stage 4 chronic kidney disease, or unspecified chronic kidney disease: Secondary | ICD-10-CM | POA: Insufficient documentation

## 2016-11-21 DIAGNOSIS — Z01812 Encounter for preprocedural laboratory examination: Secondary | ICD-10-CM | POA: Insufficient documentation

## 2016-11-21 DIAGNOSIS — E1122 Type 2 diabetes mellitus with diabetic chronic kidney disease: Secondary | ICD-10-CM | POA: Diagnosis not present

## 2016-11-21 DIAGNOSIS — M549 Dorsalgia, unspecified: Secondary | ICD-10-CM | POA: Diagnosis not present

## 2016-11-21 DIAGNOSIS — G8929 Other chronic pain: Secondary | ICD-10-CM | POA: Insufficient documentation

## 2016-11-21 HISTORY — DX: Anemia, unspecified: D64.9

## 2016-11-21 HISTORY — DX: Unspecified dementia, unspecified severity, without behavioral disturbance, psychotic disturbance, mood disturbance, and anxiety: F03.90

## 2016-11-21 HISTORY — DX: Major depressive disorder, single episode, unspecified: F32.9

## 2016-11-21 HISTORY — DX: Depression, unspecified: F32.A

## 2016-11-21 LAB — BASIC METABOLIC PANEL
Anion gap: 12 (ref 5–15)
BUN: 35 mg/dL — ABNORMAL HIGH (ref 6–20)
CHLORIDE: 106 mmol/L (ref 101–111)
CO2: 20 mmol/L — AB (ref 22–32)
CREATININE: 2.38 mg/dL — AB (ref 0.61–1.24)
Calcium: 9.9 mg/dL (ref 8.9–10.3)
GFR calc non Af Amer: 26 mL/min — ABNORMAL LOW (ref >60)
GFR, EST AFRICAN AMERICAN: 30 mL/min — AB (ref >60)
Glucose, Bld: 100 mg/dL — ABNORMAL HIGH (ref 65–99)
POTASSIUM: 4.1 mmol/L (ref 3.5–5.1)
Sodium: 138 mmol/L (ref 135–145)

## 2016-11-21 LAB — CBC
HEMATOCRIT: 41.5 % (ref 39.0–52.0)
Hemoglobin: 13.5 g/dL (ref 13.0–17.0)
MCH: 29.2 pg (ref 26.0–34.0)
MCHC: 32.5 g/dL (ref 30.0–36.0)
MCV: 89.8 fL (ref 78.0–100.0)
PLATELETS: 383 10*3/uL (ref 150–400)
RBC: 4.62 MIL/uL (ref 4.22–5.81)
RDW: 14.3 % (ref 11.5–15.5)
WBC: 9.8 10*3/uL (ref 4.0–10.5)

## 2016-11-21 LAB — TYPE AND SCREEN
ABO/RH(D): O POS
Antibody Screen: NEGATIVE

## 2016-11-21 LAB — GLUCOSE, CAPILLARY: Glucose-Capillary: 103 mg/dL — ABNORMAL HIGH (ref 65–99)

## 2016-11-21 LAB — SURGICAL PCR SCREEN
MRSA, PCR: NEGATIVE
STAPHYLOCOCCUS AUREUS: NEGATIVE

## 2016-11-21 NOTE — Progress Notes (Addendum)
PCP is Dr. Nelda Bucks  Cardiologist is Dr. Bettina Gavia   Stress test noted form 07-21-15 States cath was many years ago. Denies ever having an echo. Denies a recent EKG or Chest xray. Reports that he doesn't check his CBG often, but when he does they run in the 90's.

## 2016-11-21 NOTE — Pre-Procedure Instructions (Signed)
Joseph Hebert  11/21/2016      [facility], [address] [place] [address] Phone: [phone] Fax: [phone]    Your procedure is scheduled on Dec. 14.  Report to [facility] at [phone].  Call this number if you have problems the morning of surgery:  [phone]   Remember:  Do not eat food or drink liquids after midnight.  Take these medicines the morning of surgery with A SIP OF WATER- Amlodipine (Norvasc), Metoprolol succinate (Toprol-XL), Uloric, memantine (Namenda) Stop taking aspirin, BC's, Goody's, Herbal medications, Fish Oil, Aleve, Ibuprofen, Advil, Motrin   Do not wear jewelry, make-up or nail polish.  Do not wear lotions, powders, or perfumes, or deoderant.  Do not shave 48 hours prior to surgery.  Men may shave face and neck.  Do not bring valuables to the hospital.  [facility] is not responsible for any belongings or valuables.  Contacts, dentures or bridgework may not be worn into surgery.  Leave your suitcase in the car.  After surgery it may be brought to your room.  For patients admitted to the hospital, discharge time will be determined by your treatment team.  Patients discharged the day of surgery will not be allowed to drive home.  Special instructions:  [facility] - Preparing for Surgery  Before surgery, you can play an important role.  Because skin is not sterile, your skin needs to be as free of germs as possible.  You can reduce the number of germs on you skin by washing with CHG (chlorahexidine gluconate) soap before surgery.  CHG is an antiseptic cleaner which kills germs and bonds with the skin to continue killing germs even after washing.  Please DO NOT use if you have an allergy to CHG or antibacterial soaps.  If your skin becomes reddened/irritated stop using the CHG and inform your nurse when you arrive at Short  Stay.  Do not shave (including legs and underarms) for at least 48 hours prior to the first CHG shower.  You may shave your face.  Please follow these instructions carefully:   1.  Shower with CHG Soap the night before surgery and the    morning of Surgery.  2.  If you choose to wash your hair, wash your hair first as usual with your     normal shampoo.  3.  After you shampoo, rinse your hair and body thoroughly to remove the   Shampoo.  4.  Use CHG as you would any other liquid soap.  You can apply chg directly       to the skin and wash gently with scrungie or a clean washcloth.  5.  Apply the CHG Soap to your body ONLY FROM THE NECK DOWN.        Do not use on open wounds or open sores.  Avoid contact with your eyes,   ears, mouth and genitals (private parts).  Wash genitals (private parts)  with your normal soap.  6.  Wash thoroughly, paying special attention to the area where your surgery will be performed.  7.  Thoroughly rinse your body with warm water from the neck down.  8.  DO NOT shower/wash with your normal soap after using and rinsing off the CHG Soap.  9.  Pat yourself dry with a clean towel.  10.  Wear clean pajamas.            11.  Place clean sheets on your bed the night of your first shower and do not   sleep with pets.  Day of Surgery  Do not apply any lotions/deoderants the morning of surgery.  Please wear clean clothes to the hospital/surgery center.     Please read over the following fact sheets that you were given. Coughing and Deep Breathing, MRSA Information and Surgical Site Infection Prevention

## 2016-11-22 ENCOUNTER — Encounter (HOSPITAL_COMMUNITY): Payer: Self-pay | Admitting: Emergency Medicine

## 2016-11-22 LAB — HEMOGLOBIN A1C
Hgb A1c MFr Bld: 6.3 % — ABNORMAL HIGH (ref 4.8–5.6)
MEAN PLASMA GLUCOSE: 134 mg/dL

## 2016-11-22 NOTE — Progress Notes (Signed)
Anesthesia Chart Review: Patient is a 70 year old male scheduled for L5-S1 lateral position ALIF, L4-5 lateral interbody fusion with L4-S1 posterolateral fusion by Dr. Cyndy Freeze with abdominal exposure by Dr. [name] on 11/29/16.  History includes former smoker, COPD, exertional dyspnea, arrhythmia (not specified), GERD, hiatal hernia, HTN, HLD, asthma, vertigo, head injury as a child, DM2, TB '06, CKD stage III, arthritis, chronic back pain, gastric ulcer, impaired memory, right TKA, PAD s/p left FPBG 08/23/15, carotid occlusive disease s/p left CEA 10/31/10, s/p excision left neck nodule 09/21/15 (sebaceous cyst), L4-5 laminectomy/foraminotomy 02/17/16. CAD is listed on his history, but in review of Dr. Joya Gaskins 07/18/15 office note (Care Everywhere), he writes that "He has a history of having had a heart catheterization by me some more in the range of 10-15 years ago or greater that was normal. He had both an echocardiogram with normal ejection fraction perfusion study 3 years ago that were normal." He recommended a stress test prior to patient having his FPBG last year, since patient had known CAD risk factor with exercise tolerance < 4 METS. 2016 Stress test was non-ischemic. BMI is consistent with obesity.   - PCP is Dr. Nelda Bucks. - Nephrologist is Dr. Jeneen Rinks Deterding at [facility] last visit 10/22/16 with Larina Earthly, PA-C. Patient notified her of surgery plans. Cr 2.23 at that visit. Some of her impressions do not include a plan (incomplete note ?), but four month follow-up planned.  - Vascular surgeon is Dr. Ruta Hinds. - He is not routinely followed by a cardiologist, but Dr. [name] sent him to Dr. Shirlee More ([facility]; see Care Everywhere) in August 2016 for cardiology pre-operative evaluation prior to FPBG due to need for vascular surgery in a patient with CAD risk factors and abnormal EKG. 07/2015 stress test was non-ischemic. By notes, normal coronaries by  cath > 10 years ago.   11/21/16 EKG: SB at 56 bpm, occasional PVC, T wave abnormality, consider anterolateral ischemia. Compared to 06/24/15, PVC is now present, otherwise no significant changes.   07/21/15 Nuclear stress test [facility]): Impression: 1. Functional capacity is not assessed. 2. Normal resting left ventricular size and function, with end-systolic volume of 35 mL and ejection fraction is 63%. 3. No perfusion defects--rest or vasodilators stress.  Conclusion: Negative pharmacologic perfusion stress test for potential ischemia.  09/20/16 Carotid U/S: Impression: RICA velocities suggest 40-59% stenosis (top end of range). Patent left carotid endarterectomy site without evidence of restenosis.   06/21/16 Renal U/S: IMPRESSION: 1. Elevated peak systolic velocities in both renal arteries at the origins. There are also elevated renal artery ratios in both kidneys. Findings are suggestive for greater than 60% stenosis and potentially hemodynamically significant stenosis in bilateral renal arteries. 2. Bilateral renal cysts. 3. Slightly increased echogenicity in both kidneys with mild cortical thinning. Findings compatible with chronic medical renal disease. No hydronephrosis.  Preoperative labs noted. BUN/Cr 35/2.38, previously Cr 2.23 at [facility] on 10/22/16 (with range of 1.78-2.32 since at least 06/2015). Known CKD history. Had renal U/S this year by Dr. Deterding--records requested.  (Cr was ~ 1.80 even dating back to 2011; known CKD stage III history) Glucose 134. CBC WNL. A1c 6.3.   EKG appears stable. Stress test last year was non-ischemic. Overall, renal function also appears stable (at higher end of baseline). He is actively being followed by nephrology. Reviewed with anesthesiologist Dr. Conrad [place]. If no acute changes then it is anticipated that he can proceed as planned. Surgeon can follow renal function  and consult nephrology during hospitalization as appropriate.    [facility] Short Stay Center/Anesthesiology Phone [phone] 11/22/2016 5:10 PM

## 2016-11-29 ENCOUNTER — Inpatient Hospital Stay (HOSPITAL_COMMUNITY): Admission: RE | Admit: 2016-11-29 | Payer: Medicare Other | Source: Ambulatory Visit | Admitting: Neurological Surgery

## 2016-11-29 ENCOUNTER — Encounter (HOSPITAL_COMMUNITY): Admission: RE | Payer: Self-pay | Source: Ambulatory Visit

## 2016-11-29 SURGERY — ANTERIOR LUMBAR FUSION 1 LEVEL
Anesthesia: General

## 2016-12-05 DIAGNOSIS — M4727 Other spondylosis with radiculopathy, lumbosacral region: Secondary | ICD-10-CM | POA: Diagnosis not present

## 2016-12-05 DIAGNOSIS — R2689 Other abnormalities of gait and mobility: Secondary | ICD-10-CM | POA: Diagnosis not present

## 2016-12-05 DIAGNOSIS — M545 Low back pain: Secondary | ICD-10-CM | POA: Diagnosis not present

## 2016-12-11 DIAGNOSIS — R2689 Other abnormalities of gait and mobility: Secondary | ICD-10-CM | POA: Diagnosis not present

## 2016-12-11 DIAGNOSIS — M4727 Other spondylosis with radiculopathy, lumbosacral region: Secondary | ICD-10-CM | POA: Diagnosis not present

## 2016-12-11 DIAGNOSIS — M545 Low back pain: Secondary | ICD-10-CM | POA: Diagnosis not present

## 2016-12-13 DIAGNOSIS — M4727 Other spondylosis with radiculopathy, lumbosacral region: Secondary | ICD-10-CM | POA: Diagnosis not present

## 2016-12-13 DIAGNOSIS — M545 Low back pain: Secondary | ICD-10-CM | POA: Diagnosis not present

## 2016-12-13 DIAGNOSIS — R2689 Other abnormalities of gait and mobility: Secondary | ICD-10-CM | POA: Diagnosis not present

## 2017-01-08 ENCOUNTER — Other Ambulatory Visit: Payer: Self-pay | Admitting: Neurological Surgery

## 2017-01-11 ENCOUNTER — Other Ambulatory Visit (HOSPITAL_COMMUNITY): Payer: Self-pay | Admitting: Orthopedic Surgery

## 2017-01-11 DIAGNOSIS — M4727 Other spondylosis with radiculopathy, lumbosacral region: Secondary | ICD-10-CM

## 2017-01-14 ENCOUNTER — Other Ambulatory Visit (HOSPITAL_COMMUNITY): Payer: Self-pay | Admitting: Neurological Surgery

## 2017-01-14 DIAGNOSIS — M4727 Other spondylosis with radiculopathy, lumbosacral region: Secondary | ICD-10-CM

## 2017-01-15 ENCOUNTER — Other Ambulatory Visit: Payer: Self-pay

## 2017-01-15 ENCOUNTER — Ambulatory Visit (HOSPITAL_COMMUNITY)
Admission: RE | Admit: 2017-01-15 | Discharge: 2017-01-15 | Disposition: A | Discharge status: Home / Self Care | Payer: Medicare Other | Source: Ambulatory Visit | Attending: Neurological Surgery | Admitting: Neurological Surgery

## 2017-01-15 DIAGNOSIS — I723 Aneurysm of iliac artery: Secondary | ICD-10-CM | POA: Diagnosis not present

## 2017-01-15 DIAGNOSIS — M4727 Other spondylosis with radiculopathy, lumbosacral region: Secondary | ICD-10-CM | POA: Diagnosis present

## 2017-01-15 DIAGNOSIS — I700 Atherosclerosis of aorta: Secondary | ICD-10-CM | POA: Diagnosis not present

## 2017-01-15 DIAGNOSIS — I77811 Abdominal aortic ectasia: Secondary | ICD-10-CM | POA: Insufficient documentation

## 2017-01-16 ENCOUNTER — Encounter (HOSPITAL_COMMUNITY): Payer: Self-pay

## 2017-01-16 ENCOUNTER — Encounter (HOSPITAL_COMMUNITY)
Admission: RE | Admit: 2017-01-16 | Discharge: 2017-01-16 | Disposition: A | Discharge status: Home / Self Care | Payer: Medicare Other | Source: Ambulatory Visit | Attending: Neurological Surgery | Admitting: Neurological Surgery

## 2017-01-16 DIAGNOSIS — M4727 Other spondylosis with radiculopathy, lumbosacral region: Secondary | ICD-10-CM | POA: Diagnosis not present

## 2017-01-16 DIAGNOSIS — Z01812 Encounter for preprocedural laboratory examination: Secondary | ICD-10-CM | POA: Insufficient documentation

## 2017-01-16 DIAGNOSIS — J449 Chronic obstructive pulmonary disease, unspecified: Secondary | ICD-10-CM | POA: Diagnosis not present

## 2017-01-16 DIAGNOSIS — G8929 Other chronic pain: Secondary | ICD-10-CM | POA: Insufficient documentation

## 2017-01-16 DIAGNOSIS — M109 Gout, unspecified: Secondary | ICD-10-CM | POA: Diagnosis not present

## 2017-01-16 DIAGNOSIS — I129 Hypertensive chronic kidney disease with stage 1 through stage 4 chronic kidney disease, or unspecified chronic kidney disease: Secondary | ICD-10-CM | POA: Diagnosis not present

## 2017-01-16 DIAGNOSIS — M545 Low back pain: Secondary | ICD-10-CM | POA: Insufficient documentation

## 2017-01-16 DIAGNOSIS — F329 Major depressive disorder, single episode, unspecified: Secondary | ICD-10-CM | POA: Insufficient documentation

## 2017-01-16 DIAGNOSIS — N183 Chronic kidney disease, stage 3 (moderate): Secondary | ICD-10-CM | POA: Insufficient documentation

## 2017-01-16 DIAGNOSIS — E1122 Type 2 diabetes mellitus with diabetic chronic kidney disease: Secondary | ICD-10-CM | POA: Insufficient documentation

## 2017-01-16 DIAGNOSIS — E785 Hyperlipidemia, unspecified: Secondary | ICD-10-CM | POA: Insufficient documentation

## 2017-01-16 DIAGNOSIS — I2510 Atherosclerotic heart disease of native coronary artery without angina pectoris: Secondary | ICD-10-CM | POA: Diagnosis not present

## 2017-01-16 DIAGNOSIS — F0390 Unspecified dementia without behavioral disturbance: Secondary | ICD-10-CM | POA: Diagnosis not present

## 2017-01-16 DIAGNOSIS — R200 Anesthesia of skin: Secondary | ICD-10-CM | POA: Diagnosis not present

## 2017-01-16 DIAGNOSIS — D649 Anemia, unspecified: Secondary | ICD-10-CM | POA: Insufficient documentation

## 2017-01-16 DIAGNOSIS — R42 Dizziness and giddiness: Secondary | ICD-10-CM | POA: Diagnosis not present

## 2017-01-16 DIAGNOSIS — D3130 Benign neoplasm of unspecified choroid: Secondary | ICD-10-CM | POA: Diagnosis not present

## 2017-01-16 LAB — BASIC METABOLIC PANEL
Anion gap: 10 (ref 5–15)
BUN: 37 mg/dL — AB (ref 6–20)
CHLORIDE: 105 mmol/L (ref 101–111)
CO2: 25 mmol/L (ref 22–32)
CREATININE: 2.63 mg/dL — AB (ref 0.61–1.24)
Calcium: 9.4 mg/dL (ref 8.9–10.3)
GFR calc Af Amer: 27 mL/min — ABNORMAL LOW (ref >60)
GFR calc non Af Amer: 23 mL/min — ABNORMAL LOW (ref >60)
GLUCOSE: 266 mg/dL — AB (ref 65–99)
Potassium: 3.8 mmol/L (ref 3.5–5.1)
Sodium: 140 mmol/L (ref 135–145)

## 2017-01-16 LAB — CBC
HEMATOCRIT: 38.5 % — AB (ref 39.0–52.0)
Hemoglobin: 12.5 g/dL — ABNORMAL LOW (ref 13.0–17.0)
MCH: 29.1 pg (ref 26.0–34.0)
MCHC: 32.5 g/dL (ref 30.0–36.0)
MCV: 89.7 fL (ref 78.0–100.0)
PLATELETS: 400 10*3/uL (ref 150–400)
RBC: 4.29 MIL/uL (ref 4.22–5.81)
RDW: 14.9 % (ref 11.5–15.5)
WBC: 8.6 10*3/uL (ref 4.0–10.5)

## 2017-01-16 LAB — SURGICAL PCR SCREEN
MRSA, PCR: NEGATIVE
Staphylococcus aureus: NEGATIVE

## 2017-01-16 NOTE — Progress Notes (Signed)
   01/16/17 1430  OBSTRUCTIVE SLEEP APNEA  Have you ever been diagnosed with sleep apnea through a sleep study? No  Do you snore loudly (loud enough to be heard through closed doors)?  1  Do you often feel tired, fatigued, or sleepy during the daytime (such as falling asleep during driving or talking to someone)? 0  Has anyone observed you stop breathing during your sleep? 0  Do you have, or are you being treated for high blood pressure? 1  BMI more than 35 kg/m2? 0  Age > 50 (1-yes) 1  Neck circumference greater than:Male 16 inches or larger, Male 17inches or larger? 1 (76.5)  Male Gender (Yes=1) 1  Obstructive Sleep Apnea Score 5  Score 5 or greater  Results sent to PCP

## 2017-01-16 NOTE — Pre-Procedure Instructions (Addendum)
Joseph Hebert  01/16/2017      [facility] [place], [address] AT [address] [place] [address] Phone: [phone] Fax: [phone]    Your procedure is scheduled on 01/22/17  Report to [facility] Admitting at 530 A.M.  Call this number if you have problems the morning of surgery:  [phone]   Remember:  Do not eat food or drink liquids after midnight.  Take these medicines the morning of surgery with A SIP OF WATER     Amlodipine(norvasc),hydralazine(apresoline),metoprolol,memantine(nameda), uloric  STOP all herbel meds, nsaids (aleve,naproxen,advil,ibuprofen) starting Now including aspirin, all vitamins/supplements     Do not wear jewelry, make-up or nail polish.  Do not wear lotions, powders, or perfumes, or deoderant.  Do not shave 48 hours prior to surgery.  Men may shave face and neck.  Do not bring valuables to the hospital.  [facility] is not responsible for any belongings or valuables.  Contacts, dentures or bridgework may not be worn into surgery.  Leave your suitcase in the car.  After surgery it may be brought to your room.  For patients admitted to the hospital, discharge time will be determined by your treatment team.  Patients discharged the day of surgery will not be allowed to drive home.   Special instructions  Special Instructions: [place] - Preparing for Surgery  Before surgery, you can play an important role.  Because skin is not sterile, your skin needs to be as free of germs as possible.  You can reduce the number of germs on you skin by washing with CHG (chlorahexidine gluconate) soap before surgery.  CHG is an antiseptic cleaner which kills germs and bonds with the skin to continue killing germs even after washing.  Please DO NOT use if you have an allergy to CHG or antibacterial soaps.  If your skin becomes reddened/irritated stop using the CHG  and inform your nurse when you arrive at Short Stay.  Do not shave (including legs and underarms) for at least 48 hours prior to the first CHG shower.  You may shave your face.  Please follow these instructions carefully:   1.  Shower with CHG Soap the night before surgery and the morning of Surgery.  2.  If you choose to wash your hair, wash your hair first as usual with your normal shampoo.  3.  After you shampoo, rinse your hair and body thoroughly to remove the Shampoo.  4.  Use CHG as you would any other liquid soap.  You can apply chg directly  to the skin and wash gently with scrungie or a clean washcloth.  5.  Apply the CHG Soap to your body ONLY FROM THE NECK DOWN.  Do not use on open wounds or open sores.  Avoid contact with your eyes ears, mouth and genitals (private parts).  Wash genitals (private parts)       with your normal soap.  6.  Wash thoroughly, paying special attention to the area where your surgery will be performed.  7.  Thoroughly rinse your body with warm water from the neck down.  8.  DO NOT shower/wash with your normal soap after using and rinsing off the CHG Soap.  9.  Pat yourself dry with a clean towel.            10.  Wear clean pajamas.  11.  Place clean sheets on your bed the night of your first shower and do not sleep with pets.  Day of Surgery  Do not apply any lotions/deodorants the morning of surgery.  Please wear clean clothes to the hospital/surgery center.  Please read over the fact sheets that you were given.

## 2017-01-17 NOTE — Progress Notes (Addendum)
Anesthesia Chart Review:  Pt is a 71 year old male scheduled for L5-S1 lateral position PLIF, L3-4, L4-5 lateral interbody fusion, L3-S1 percutaneous pedicle screw fixation, abdominal exposure on 01/22/2017 with Cyndy Freeze, M.D. and Servando Snare, M.D.  Surgery was originally scheduled 11/29/16 but was postponed for insurance reasons.  History includes former smoker, COPD, exertional dyspnea, arrhythmia (not specified), GERD, hiatal hernia, HTN, HLD, asthma, vertigo, head injury as a child, DM2, TB '06, CKD stage III, arthritis, chronic back pain, gastric ulcer, impaired memory, right TKA, PAD s/p left FPBG 08/23/15, carotid occlusive disease s/p left CEA 10/31/10, s/p excision left neck nodule 09/21/15 (sebaceous cyst), L4-5 laminectomy/foraminotomy 02/17/16. CAD is listed on his history, but in review of Dr. Joya Gaskins 07/18/15 office note (Care Everywhere), he writes that "He has a history of having had a heart catheterization by me some more in the range of 10-15 years ago or greater that was normal. He had both an echocardiogram with normal ejection fraction perfusion study 3 years ago that were normal." He recommended a stress test prior to patient having his FPBG last year, since patient had known CAD risk factor with exercise tolerance < 4 METS. 2016 Stress test was non-ischemic. BMI is consistent with obesity.  - PCP is Dr. Nelda Bucks. - Nephrologist is Dr. Jeneen Rinks Deterding at [facility] ([facility]), last visit 10/22/16 with Larina Earthly, PA-C. Patient notified her of surgery plans. Cr 2.23 at that visit. Some of her impressions do not include a plan (incomplete note ?), but four month follow-up planned.  - Vascular surgeon is Dr. Ruta Hinds. - He is not routinely followed by a cardiologist, but Dr. [name] sent him to Dr. Shirlee More ([facility]; see Care Everywhere) in August 2016 for cardiology pre-operative evaluation prior to FPBG due to need for vascular surgery in  a patient with CAD risk factors and abnormal EKG. 07/2015 stress test was non-ischemic. By notes, normal coronaries by cath > 10 years ago.   11/21/16 EKG: SB at 56 bpm, occasional PVC, T wave abnormality, consider anterolateral ischemia. Compared to 06/24/15, PVC is now present, otherwise no significant changes.   07/21/15 Nuclear stress test [facility]): Impression: 1. Functional capacity is not assessed. 2. Normal resting left ventricular size and function, with end-systolic volume of 35 mL and ejection fraction is 63%. 3. No perfusion defects--rest or vasodilators stress.  Conclusion: Negative pharmacologic perfusion stress test for potential ischemia.  09/20/16 Carotid U/S: Impression: RICA velocities suggest 40-59% stenosis (top end of range). Patent left carotid endarterectomy site without evidence of restenosis.   06/21/16 Renal U/S: IMPRESSION: 1. Elevated peak systolic velocities in both renal arteries at the origins. There are also elevated renal artery ratios in both kidneys. Findings are suggestive for greater than 60% stenosis and potentially hemodynamically significant stenosis in bilateral renal arteries. 2. Bilateral renal cysts. 3. Slightly increased echogenicity in both kidneys with mild cortical thinning. Findings compatible with chronic medical renal disease. No hydronephrosis.  Preoperative labs noted.  - PT/PTT will be obtained DOS.  - Glucose 266. CBC WNL. A1c was 6.3 on 11/21/16.  - BUN/Cr 37/2.63, previously Cr 2.38 on 11/21/16 and 2.23 at [facility] on 10/22/16 (with range of 1.78-2.32 since at least 06/2015). I called and spoke with Catalina [place], Dr. Deterding's assistant about slightly increased Cr from baseline; per [facility], Dr. Jimmy Footman feels pt can proceed with surgery.   EKG appears stable. Stress test last year was non-ischemic. Renal function also acceptable per Dr. Jimmy Footman. He is actively being followed by  nephrology. Reviewed with anesthesiologist Dr. Conrad [place].  If no acute changes then it is anticipated that he can proceed as planned. Surgeon can follow renal function and consult nephrology during hospitalization as appropriate.   If no changes, I anticipate pt can proceed with surgery as scheduled.   Willeen Cass, FNP-BC [facility] Short Stay Surgical Center/Anesthesiology Phone: [phone] 01/17/2017 3:06 PM

## 2017-01-22 ENCOUNTER — Inpatient Hospital Stay (HOSPITAL_COMMUNITY)
Admission: RE | Admit: 2017-01-22 | Discharge: 2017-02-08 | DRG: 453 | Disposition: A | Discharge status: Skilled Nursing Facility | Payer: Medicare Other | Source: Ambulatory Visit | Attending: Neurological Surgery | Admitting: Neurological Surgery

## 2017-01-22 ENCOUNTER — Inpatient Hospital Stay (HOSPITAL_COMMUNITY): Payer: Medicare Other | Admitting: Certified Registered Nurse Anesthetist

## 2017-01-22 ENCOUNTER — Encounter (HOSPITAL_COMMUNITY): Payer: Self-pay | Admitting: *Deleted

## 2017-01-22 ENCOUNTER — Encounter (HOSPITAL_COMMUNITY)
Admission: RE | Disposition: A | Discharge status: Skilled Nursing Facility | Payer: Self-pay | Source: Ambulatory Visit | Attending: Neurological Surgery

## 2017-01-22 ENCOUNTER — Inpatient Hospital Stay (HOSPITAL_COMMUNITY): Payer: Medicare Other | Admitting: Emergency Medicine

## 2017-01-22 ENCOUNTER — Inpatient Hospital Stay (HOSPITAL_COMMUNITY): Payer: Medicare Other

## 2017-01-22 DIAGNOSIS — M4727 Other spondylosis with radiculopathy, lumbosacral region: Principal | ICD-10-CM | POA: Diagnosis present

## 2017-01-22 DIAGNOSIS — Z96651 Presence of right artificial knee joint: Secondary | ICD-10-CM | POA: Diagnosis present

## 2017-01-22 DIAGNOSIS — E1151 Type 2 diabetes mellitus with diabetic peripheral angiopathy without gangrene: Secondary | ICD-10-CM | POA: Diagnosis present

## 2017-01-22 DIAGNOSIS — Z87891 Personal history of nicotine dependence: Secondary | ICD-10-CM

## 2017-01-22 DIAGNOSIS — M5117 Intervertebral disc disorders with radiculopathy, lumbosacral region: Secondary | ICD-10-CM | POA: Diagnosis present

## 2017-01-22 DIAGNOSIS — E870 Hyperosmolality and hypernatremia: Secondary | ICD-10-CM | POA: Diagnosis not present

## 2017-01-22 DIAGNOSIS — Z781 Physical restraint status: Secondary | ICD-10-CM

## 2017-01-22 DIAGNOSIS — J9811 Atelectasis: Secondary | ICD-10-CM | POA: Diagnosis not present

## 2017-01-22 DIAGNOSIS — R34 Anuria and oliguria: Secondary | ICD-10-CM | POA: Diagnosis not present

## 2017-01-22 DIAGNOSIS — Z7982 Long term (current) use of aspirin: Secondary | ICD-10-CM | POA: Diagnosis not present

## 2017-01-22 DIAGNOSIS — J810 Acute pulmonary edema: Secondary | ICD-10-CM | POA: Diagnosis not present

## 2017-01-22 DIAGNOSIS — Z01818 Encounter for other preprocedural examination: Secondary | ICD-10-CM

## 2017-01-22 DIAGNOSIS — J156 Pneumonia due to other aerobic Gram-negative bacteria: Secondary | ICD-10-CM | POA: Diagnosis not present

## 2017-01-22 DIAGNOSIS — R339 Retention of urine, unspecified: Secondary | ICD-10-CM | POA: Diagnosis not present

## 2017-01-22 DIAGNOSIS — K661 Hemoperitoneum: Secondary | ICD-10-CM | POA: Diagnosis not present

## 2017-01-22 DIAGNOSIS — R0902 Hypoxemia: Secondary | ICD-10-CM

## 2017-01-22 DIAGNOSIS — I471 Supraventricular tachycardia: Secondary | ICD-10-CM | POA: Diagnosis not present

## 2017-01-22 DIAGNOSIS — G9340 Encephalopathy, unspecified: Secondary | ICD-10-CM

## 2017-01-22 DIAGNOSIS — J9601 Acute respiratory failure with hypoxia: Secondary | ICD-10-CM | POA: Diagnosis not present

## 2017-01-22 DIAGNOSIS — Z95828 Presence of other vascular implants and grafts: Secondary | ICD-10-CM

## 2017-01-22 DIAGNOSIS — Z4659 Encounter for fitting and adjustment of other gastrointestinal appliance and device: Secondary | ICD-10-CM

## 2017-01-22 DIAGNOSIS — D62 Acute posthemorrhagic anemia: Secondary | ICD-10-CM | POA: Diagnosis not present

## 2017-01-22 DIAGNOSIS — Z79899 Other long term (current) drug therapy: Secondary | ICD-10-CM

## 2017-01-22 DIAGNOSIS — E119 Type 2 diabetes mellitus without complications: Secondary | ICD-10-CM | POA: Diagnosis not present

## 2017-01-22 DIAGNOSIS — R1110 Vomiting, unspecified: Secondary | ICD-10-CM

## 2017-01-22 DIAGNOSIS — I469 Cardiac arrest, cause unspecified: Secondary | ICD-10-CM

## 2017-01-22 DIAGNOSIS — J449 Chronic obstructive pulmonary disease, unspecified: Secondary | ICD-10-CM | POA: Diagnosis present

## 2017-01-22 DIAGNOSIS — M109 Gout, unspecified: Secondary | ICD-10-CM | POA: Diagnosis present

## 2017-01-22 DIAGNOSIS — E876 Hypokalemia: Secondary | ICD-10-CM | POA: Diagnosis present

## 2017-01-22 DIAGNOSIS — E1122 Type 2 diabetes mellitus with diabetic chronic kidney disease: Secondary | ICD-10-CM | POA: Diagnosis present

## 2017-01-22 DIAGNOSIS — R570 Cardiogenic shock: Secondary | ICD-10-CM | POA: Diagnosis not present

## 2017-01-22 DIAGNOSIS — F0390 Unspecified dementia without behavioral disturbance: Secondary | ICD-10-CM | POA: Diagnosis present

## 2017-01-22 DIAGNOSIS — Z8611 Personal history of tuberculosis: Secondary | ICD-10-CM

## 2017-01-22 DIAGNOSIS — Z6834 Body mass index (BMI) 34.0-34.9, adult: Secondary | ICD-10-CM

## 2017-01-22 DIAGNOSIS — J9690 Respiratory failure, unspecified, unspecified whether with hypoxia or hypercapnia: Secondary | ICD-10-CM

## 2017-01-22 DIAGNOSIS — Z833 Family history of diabetes mellitus: Secondary | ICD-10-CM

## 2017-01-22 DIAGNOSIS — R001 Bradycardia, unspecified: Secondary | ICD-10-CM | POA: Diagnosis not present

## 2017-01-22 DIAGNOSIS — Z9841 Cataract extraction status, right eye: Secondary | ICD-10-CM

## 2017-01-22 DIAGNOSIS — G522 Disorders of vagus nerve: Secondary | ICD-10-CM | POA: Diagnosis present

## 2017-01-22 DIAGNOSIS — N179 Acute kidney failure, unspecified: Secondary | ICD-10-CM | POA: Diagnosis not present

## 2017-01-22 DIAGNOSIS — Z9842 Cataract extraction status, left eye: Secondary | ICD-10-CM

## 2017-01-22 DIAGNOSIS — Z8249 Family history of ischemic heart disease and other diseases of the circulatory system: Secondary | ICD-10-CM | POA: Diagnosis not present

## 2017-01-22 DIAGNOSIS — E1129 Type 2 diabetes mellitus with other diabetic kidney complication: Secondary | ICD-10-CM | POA: Diagnosis not present

## 2017-01-22 DIAGNOSIS — N390 Urinary tract infection, site not specified: Secondary | ICD-10-CM | POA: Diagnosis not present

## 2017-01-22 DIAGNOSIS — R0600 Dyspnea, unspecified: Secondary | ICD-10-CM | POA: Diagnosis not present

## 2017-01-22 DIAGNOSIS — I959 Hypotension, unspecified: Secondary | ICD-10-CM | POA: Diagnosis not present

## 2017-01-22 DIAGNOSIS — R0603 Acute respiratory distress: Secondary | ICD-10-CM

## 2017-01-22 DIAGNOSIS — D649 Anemia, unspecified: Secondary | ICD-10-CM

## 2017-01-22 DIAGNOSIS — T4275XA Adverse effect of unspecified antiepileptic and sedative-hypnotic drugs, initial encounter: Secondary | ICD-10-CM | POA: Diagnosis not present

## 2017-01-22 DIAGNOSIS — I2510 Atherosclerotic heart disease of native coronary artery without angina pectoris: Secondary | ICD-10-CM | POA: Diagnosis present

## 2017-01-22 DIAGNOSIS — J189 Pneumonia, unspecified organism: Secondary | ICD-10-CM | POA: Diagnosis not present

## 2017-01-22 DIAGNOSIS — I10 Essential (primary) hypertension: Secondary | ICD-10-CM | POA: Diagnosis not present

## 2017-01-22 DIAGNOSIS — K219 Gastro-esophageal reflux disease without esophagitis: Secondary | ICD-10-CM | POA: Diagnosis present

## 2017-01-22 DIAGNOSIS — E669 Obesity, unspecified: Secondary | ICD-10-CM | POA: Diagnosis present

## 2017-01-22 DIAGNOSIS — Z9911 Dependence on respirator [ventilator] status: Secondary | ICD-10-CM

## 2017-01-22 DIAGNOSIS — I495 Sick sinus syndrome: Secondary | ICD-10-CM | POA: Diagnosis not present

## 2017-01-22 DIAGNOSIS — D631 Anemia in chronic kidney disease: Secondary | ICD-10-CM | POA: Diagnosis not present

## 2017-01-22 DIAGNOSIS — Z419 Encounter for procedure for purposes other than remedying health state, unspecified: Secondary | ICD-10-CM

## 2017-01-22 DIAGNOSIS — I6523 Occlusion and stenosis of bilateral carotid arteries: Secondary | ICD-10-CM | POA: Diagnosis present

## 2017-01-22 DIAGNOSIS — Y95 Nosocomial condition: Secondary | ICD-10-CM | POA: Diagnosis present

## 2017-01-22 DIAGNOSIS — Z452 Encounter for adjustment and management of vascular access device: Secondary | ICD-10-CM

## 2017-01-22 DIAGNOSIS — N183 Chronic kidney disease, stage 3 (moderate): Secondary | ICD-10-CM | POA: Diagnosis not present

## 2017-01-22 DIAGNOSIS — I129 Hypertensive chronic kidney disease with stage 1 through stage 4 chronic kidney disease, or unspecified chronic kidney disease: Secondary | ICD-10-CM | POA: Diagnosis not present

## 2017-01-22 DIAGNOSIS — N184 Chronic kidney disease, stage 4 (severe): Secondary | ICD-10-CM | POA: Diagnosis present

## 2017-01-22 DIAGNOSIS — E8770 Fluid overload, unspecified: Secondary | ICD-10-CM | POA: Diagnosis present

## 2017-01-22 DIAGNOSIS — G92 Toxic encephalopathy: Secondary | ICD-10-CM | POA: Diagnosis not present

## 2017-01-22 DIAGNOSIS — M7989 Other specified soft tissue disorders: Secondary | ICD-10-CM | POA: Diagnosis not present

## 2017-01-22 DIAGNOSIS — E785 Hyperlipidemia, unspecified: Secondary | ICD-10-CM | POA: Diagnosis present

## 2017-01-22 DIAGNOSIS — M4800 Spinal stenosis, site unspecified: Secondary | ICD-10-CM | POA: Diagnosis present

## 2017-01-22 HISTORY — PX: ANTERIOR LUMBAR FUSION: SHX1170

## 2017-01-22 HISTORY — PX: ABDOMINAL EXPOSURE: SHX5708

## 2017-01-22 HISTORY — PX: LUMBAR PERCUTANEOUS PEDICLE SCREW 3 LEVEL: SHX5562

## 2017-01-22 HISTORY — PX: ANTERIOR LAT LUMBAR FUSION: SHX1168

## 2017-01-22 LAB — GLUCOSE, CAPILLARY
GLUCOSE-CAPILLARY: 120 mg/dL — AB (ref 65–99)
Glucose-Capillary: 129 mg/dL — ABNORMAL HIGH (ref 65–99)
Glucose-Capillary: 135 mg/dL — ABNORMAL HIGH (ref 65–99)

## 2017-01-22 LAB — PREPARE RBC (CROSSMATCH)

## 2017-01-22 LAB — PROTIME-INR
INR: 0.99
Prothrombin Time: 13.1 seconds (ref 11.4–15.2)

## 2017-01-22 LAB — APTT: APTT: 33 s (ref 24–36)

## 2017-01-22 SURGERY — ANTERIOR LUMBAR FUSION 1 LEVEL
Anesthesia: General | Site: Spine Lumbar

## 2017-01-22 MED ORDER — PHENOL 1.4 % MT LIQD
1.0000 | OROMUCOSAL | Status: DC | PRN
Start: 2017-01-22 — End: 2017-01-26

## 2017-01-22 MED ORDER — FUROSEMIDE 10 MG/ML IJ SOLN
INTRAMUSCULAR | Status: DC | PRN
  Administered 2017-01-22 (×2): 20 mg via INTRAMUSCULAR

## 2017-01-22 MED ORDER — SODIUM CHLORIDE 0.9 % IR SOLN
Status: DC | PRN
  Administered 2017-01-22: 500 mL

## 2017-01-22 MED ORDER — BUPIVACAINE-EPINEPHRINE 0.5% -1:200000 IJ SOLN
INTRAMUSCULAR | Status: DC | PRN
  Administered 2017-01-22: 6.5 mL
  Administered 2017-01-22: 7.5 mL

## 2017-01-22 MED ORDER — SUGAMMADEX SODIUM 200 MG/2ML IV SOLN
INTRAVENOUS | Status: DC | PRN
  Administered 2017-01-22: 200 mg via INTRAVENOUS

## 2017-01-22 MED ORDER — THROMBIN 5000 UNITS EX SOLR
OROMUCOSAL | Status: DC | PRN
  Administered 2017-01-22 (×2): 5 mL via TOPICAL

## 2017-01-22 MED ORDER — CEFAZOLIN SODIUM-DEXTROSE 2-4 GM/100ML-% IV SOLN
2.0000 g | Freq: Three times a day (TID) | INTRAVENOUS | Status: AC
  Administered 2017-01-22 – 2017-01-23 (×2): 2 g via INTRAVENOUS
  Filled 2017-01-22 (×2): qty 100

## 2017-01-22 MED ORDER — LISINOPRIL 20 MG PO TABS
40.0000 mg | ORAL_TABLET | Freq: Every day | ORAL | Status: DC
  Administered 2017-01-22 – 2017-01-24 (×2): 40 mg via ORAL
  Filled 2017-01-22 (×3): qty 2

## 2017-01-22 MED ORDER — LIDOCAINE 2% (20 MG/ML) 5 ML SYRINGE
INTRAMUSCULAR | Status: AC
  Filled 2017-01-22: qty 5

## 2017-01-22 MED ORDER — GABAPENTIN 600 MG PO TABS
300.0000 mg | ORAL_TABLET | Freq: Three times a day (TID) | ORAL | Status: DC
  Administered 2017-01-22 – 2017-01-24 (×7): 300 mg via ORAL
  Filled 2017-01-22 (×5): qty 1
  Filled 2017-01-22: qty 0.5
  Filled 2017-01-22 (×2): qty 1

## 2017-01-22 MED ORDER — SODIUM CHLORIDE 0.9 % IV SOLN: 250.0000 mL | INTRAVENOUS | Status: DC

## 2017-01-22 MED ORDER — THROMBIN 20000 UNITS EX SOLR
CUTANEOUS | Status: AC
  Filled 2017-01-22: qty 20000

## 2017-01-22 MED ORDER — HYDROMORPHONE HCL 1 MG/ML IJ SOLN
0.2500 mg | INTRAMUSCULAR | Status: DC | PRN
Start: 2017-01-22 — End: 2017-01-22
  Administered 2017-01-22 (×2): 0.5 mg via INTRAVENOUS

## 2017-01-22 MED ORDER — SODIUM CHLORIDE 0.9% FLUSH
3.0000 mL | Freq: Two times a day (BID) | INTRAVENOUS | Status: DC
  Administered 2017-01-23 – 2017-01-26 (×5): 3 mL via INTRAVENOUS
  Administered 2017-01-27: 10 mL via INTRAVENOUS
  Administered 2017-01-27: 3 mL via INTRAVENOUS
  Administered 2017-01-28: 30 mL via INTRAVENOUS
  Administered 2017-01-28 – 2017-01-29 (×2): 3 mL via INTRAVENOUS
  Administered 2017-01-29: 10 mL via INTRAVENOUS
  Administered 2017-01-30 – 2017-02-02 (×6): 3 mL via INTRAVENOUS
  Administered 2017-02-02: 20 mL via INTRAVENOUS
  Administered 2017-02-03 – 2017-02-05 (×5): 3 mL via INTRAVENOUS

## 2017-01-22 MED ORDER — FENTANYL CITRATE (PF) 100 MCG/2ML IJ SOLN
INTRAMUSCULAR | Status: DC | PRN
  Administered 2017-01-22 (×3): 50 ug via INTRAVENOUS
  Administered 2017-01-22: 150 ug via INTRAVENOUS
  Administered 2017-01-22 (×2): 50 ug via INTRAVENOUS

## 2017-01-22 MED ORDER — ONDANSETRON HCL 4 MG/2ML IJ SOLN: 4.0000 mg | Freq: Once | INTRAMUSCULAR | Status: DC | PRN

## 2017-01-22 MED ORDER — FENOFIBRATE 160 MG PO TABS
160.0000 mg | ORAL_TABLET | Freq: Every day | ORAL | Status: DC
  Administered 2017-01-22 – 2017-01-25 (×4): 160 mg via ORAL
  Filled 2017-01-22 (×6): qty 1

## 2017-01-22 MED ORDER — FUROSEMIDE 40 MG PO TABS
40.0000 mg | ORAL_TABLET | Freq: Every day | ORAL | Status: DC
  Administered 2017-01-22 – 2017-01-24 (×3): 40 mg via ORAL
  Filled 2017-01-22 (×3): qty 1

## 2017-01-22 MED ORDER — SODIUM CHLORIDE 0.9 % IJ SOLN
INTRAMUSCULAR | Status: AC
  Filled 2017-01-22: qty 10

## 2017-01-22 MED ORDER — BUPIVACAINE LIPOSOME 1.3 % IJ SUSP
20.0000 mL | Freq: Once | INTRAMUSCULAR | Status: AC
  Administered 2017-01-22: 20 mL
  Filled 2017-01-22: qty 20

## 2017-01-22 MED ORDER — SODIUM CHLORIDE 0.9 % IJ SOLN
INTRAMUSCULAR | Status: DC | PRN
  Administered 2017-01-22 (×2): 10 mL

## 2017-01-22 MED ORDER — FEBUXOSTAT 40 MG PO TABS
80.0000 mg | ORAL_TABLET | Freq: Every day | ORAL | Status: DC
  Administered 2017-01-23 – 2017-01-25 (×3): 80 mg via ORAL
  Filled 2017-01-22 (×5): qty 2

## 2017-01-22 MED ORDER — ROCURONIUM BROMIDE 50 MG/5ML IV SOSY
PREFILLED_SYRINGE | INTRAVENOUS | Status: AC
  Filled 2017-01-22: qty 5

## 2017-01-22 MED ORDER — SODIUM CHLORIDE 0.9 % IV SOLN
INTRAVENOUS | Status: DC
  Administered 2017-01-22: 20:00:00 via INTRAVENOUS
  Administered 2017-01-23 – 2017-01-24 (×4): 75 mL/h via INTRAVENOUS
  Administered 2017-01-26 (×2): via INTRAVENOUS

## 2017-01-22 MED ORDER — AMLODIPINE BESYLATE 10 MG PO TABS
10.0000 mg | ORAL_TABLET | Freq: Every day | ORAL | Status: DC
  Administered 2017-01-24: 10 mg via ORAL
  Filled 2017-01-22 (×2): qty 1

## 2017-01-22 MED ORDER — CEFAZOLIN SODIUM-DEXTROSE 2-4 GM/100ML-% IV SOLN
INTRAVENOUS | Status: AC
  Filled 2017-01-22: qty 100

## 2017-01-22 MED ORDER — HYDROMORPHONE HCL 1 MG/ML IJ SOLN
INTRAMUSCULAR | Status: AC
  Filled 2017-01-22: qty 0.5

## 2017-01-22 MED ORDER — LIDOCAINE-EPINEPHRINE (PF) 2 %-1:200000 IJ SOLN
INTRAMUSCULAR | Status: DC | PRN
  Administered 2017-01-22: 6.5 mL
  Administered 2017-01-22: 7.5 mL
  Administered 2017-01-22: 20 mL

## 2017-01-22 MED ORDER — METOPROLOL SUCCINATE ER 50 MG PO TB24
50.0000 mg | ORAL_TABLET | Freq: Every day | ORAL | Status: DC
  Administered 2017-01-24: 50 mg via ORAL
  Filled 2017-01-22 (×2): qty 2

## 2017-01-22 MED ORDER — ACETAMINOPHEN 500 MG PO TABS
1000.0000 mg | ORAL_TABLET | Freq: Four times a day (QID) | ORAL | Status: DC
  Administered 2017-01-22 – 2017-02-04 (×38): 1000 mg via ORAL
  Filled 2017-01-22 (×45): qty 2

## 2017-01-22 MED ORDER — MIDAZOLAM HCL 2 MG/2ML IJ SOLN
INTRAMUSCULAR | Status: AC
  Filled 2017-01-22: qty 2

## 2017-01-22 MED ORDER — CEFAZOLIN SODIUM-DEXTROSE 2-3 GM-% IV SOLR
INTRAVENOUS | Status: DC | PRN
  Administered 2017-01-22: 2 g via INTRAVENOUS

## 2017-01-22 MED ORDER — OXYCODONE HCL 5 MG PO TABS
5.0000 mg | ORAL_TABLET | ORAL | Status: DC | PRN
  Administered 2017-01-22: 5 mg via ORAL
  Administered 2017-01-22: 10 mg via ORAL
  Administered 2017-01-23 (×2): 5 mg via ORAL
  Administered 2017-01-24 (×2): 10 mg via ORAL
  Filled 2017-01-22 (×2): qty 2
  Filled 2017-01-22: qty 1
  Filled 2017-01-22: qty 2
  Filled 2017-01-22: qty 1
  Filled 2017-01-22: qty 2
  Filled 2017-01-22: qty 1

## 2017-01-22 MED ORDER — FUROSEMIDE 10 MG/ML IJ SOLN: 20.0000 mg | INTRAMUSCULAR | Status: DC

## 2017-01-22 MED ORDER — LACTATED RINGERS IV SOLN
INTRAVENOUS | Status: DC | PRN
  Administered 2017-01-22 (×4): via INTRAVENOUS

## 2017-01-22 MED ORDER — CHLORHEXIDINE GLUCONATE CLOTH 2 % EX PADS: 6.0000 | MEDICATED_PAD | Freq: Once | CUTANEOUS | Status: DC

## 2017-01-22 MED ORDER — ONDANSETRON HCL 4 MG/2ML IJ SOLN
4.0000 mg | INTRAMUSCULAR | Status: DC | PRN
  Administered 2017-01-23 – 2017-02-05 (×5): 4 mg via INTRAVENOUS
  Filled 2017-01-22 (×5): qty 2

## 2017-01-22 MED ORDER — ONDANSETRON HCL 4 MG/2ML IJ SOLN
INTRAMUSCULAR | Status: AC
  Filled 2017-01-22: qty 2

## 2017-01-22 MED ORDER — SENNA 8.6 MG PO TABS
1.0000 | ORAL_TABLET | Freq: Two times a day (BID) | ORAL | Status: DC
  Administered 2017-01-22 – 2017-01-25 (×7): 8.6 mg via ORAL
  Filled 2017-01-22 (×7): qty 1

## 2017-01-22 MED ORDER — SODIUM CHLORIDE 0.9 % IV SOLN: 10.0000 mL/h | Freq: Once | INTRAVENOUS | Status: DC

## 2017-01-22 MED ORDER — HYDRALAZINE HCL 25 MG PO TABS
25.0000 mg | ORAL_TABLET | Freq: Two times a day (BID) | ORAL | Status: DC
  Administered 2017-01-22 – 2017-01-24 (×3): 25 mg via ORAL
  Filled 2017-01-22 (×5): qty 1

## 2017-01-22 MED ORDER — OXYCODONE HCL ER 20 MG PO T12A
20.0000 mg | EXTENDED_RELEASE_TABLET | Freq: Two times a day (BID) | ORAL | Status: DC
  Administered 2017-01-22 – 2017-01-24 (×5): 20 mg via ORAL
  Filled 2017-01-22 (×5): qty 1

## 2017-01-22 MED ORDER — THROMBIN 20000 UNITS EX SOLR
CUTANEOUS | Status: DC | PRN
  Administered 2017-01-22: 20 mL via TOPICAL

## 2017-01-22 MED ORDER — ATORVASTATIN CALCIUM 80 MG PO TABS
80.0000 mg | ORAL_TABLET | Freq: Every day | ORAL | Status: DC
  Administered 2017-01-22 – 2017-01-25 (×4): 80 mg via ORAL
  Filled 2017-01-22 (×4): qty 1

## 2017-01-22 MED ORDER — PROPOFOL 10 MG/ML IV BOLUS
INTRAVENOUS | Status: AC
  Filled 2017-01-22: qty 40

## 2017-01-22 MED ORDER — SODIUM CHLORIDE 0.9 % IJ SOLN
INTRAMUSCULAR | Status: AC
  Filled 2017-01-22: qty 20

## 2017-01-22 MED ORDER — METHOCARBAMOL 500 MG PO TABS
750.0000 mg | ORAL_TABLET | Freq: Four times a day (QID) | ORAL | Status: DC
  Administered 2017-01-22 – 2017-01-25 (×9): 750 mg via ORAL
  Filled 2017-01-22 (×6): qty 1
  Filled 2017-01-22: qty 2
  Filled 2017-01-22 (×3): qty 1

## 2017-01-22 MED ORDER — PROPOFOL 10 MG/ML IV BOLUS
INTRAVENOUS | Status: DC | PRN
  Administered 2017-01-22: 110 mg via INTRAVENOUS

## 2017-01-22 MED ORDER — LIDOCAINE HCL (CARDIAC) 20 MG/ML IV SOLN
INTRAVENOUS | Status: DC | PRN
  Administered 2017-01-22: 100 mg via INTRAVENOUS

## 2017-01-22 MED ORDER — CELECOXIB 200 MG PO CAPS
200.0000 mg | ORAL_CAPSULE | Freq: Two times a day (BID) | ORAL | Status: DC
  Administered 2017-01-22 – 2017-01-23 (×3): 200 mg via ORAL
  Filled 2017-01-22 (×4): qty 1

## 2017-01-22 MED ORDER — CHLORHEXIDINE GLUCONATE 4 % EX LIQD: 60.0000 mL | Freq: Once | CUTANEOUS | Status: DC

## 2017-01-22 MED ORDER — ROCURONIUM BROMIDE 100 MG/10ML IV SOLN
INTRAVENOUS | Status: DC | PRN
  Administered 2017-01-22 (×2): 50 mg via INTRAVENOUS

## 2017-01-22 MED ORDER — FLEET ENEMA 7-19 GM/118ML RE ENEM: 1.0000 | ENEMA | Freq: Once | RECTAL | Status: DC | PRN

## 2017-01-22 MED ORDER — PHENYLEPHRINE HCL 10 MG/ML IJ SOLN
INTRAVENOUS | Status: DC | PRN
  Administered 2017-01-22: 10 ug/min via INTRAVENOUS

## 2017-01-22 MED ORDER — MIDAZOLAM HCL 5 MG/5ML IJ SOLN
INTRAMUSCULAR | Status: DC | PRN
Start: 2017-01-22 — End: 2017-01-22
  Administered 2017-01-22: 2 mg via INTRAVENOUS

## 2017-01-22 MED ORDER — SENNOSIDES-DOCUSATE SODIUM 8.6-50 MG PO TABS
1.0000 | ORAL_TABLET | Freq: Every evening | ORAL | Status: DC | PRN
Start: 2017-01-22 — End: 2017-01-26

## 2017-01-22 MED ORDER — BISACODYL 10 MG RE SUPP: 10.0000 mg | Freq: Every day | RECTAL | Status: DC | PRN

## 2017-01-22 MED ORDER — ZOLPIDEM TARTRATE 5 MG PO TABS: 5.0000 mg | ORAL_TABLET | Freq: Every evening | ORAL | Status: DC | PRN

## 2017-01-22 MED ORDER — ALBUMIN HUMAN 5 % IV SOLN
INTRAVENOUS | Status: DC | PRN
  Administered 2017-01-22: 11:00:00 via INTRAVENOUS

## 2017-01-22 MED ORDER — PROPOFOL 10 MG/ML IV BOLUS
INTRAVENOUS | Status: AC
  Filled 2017-01-22: qty 20

## 2017-01-22 MED ORDER — FENTANYL CITRATE (PF) 100 MCG/2ML IJ SOLN
INTRAMUSCULAR | Status: AC
  Filled 2017-01-22: qty 4

## 2017-01-22 MED ORDER — CELECOXIB 200 MG PO CAPS
200.0000 mg | ORAL_CAPSULE | ORAL | Status: AC
  Administered 2017-01-22: 200 mg via ORAL

## 2017-01-22 MED ORDER — MENTHOL 3 MG MT LOZG: 1.0000 | LOZENGE | OROMUCOSAL | Status: DC | PRN

## 2017-01-22 MED ORDER — ASPIRIN EC 81 MG PO TBEC
81.0000 mg | DELAYED_RELEASE_TABLET | Freq: Every day | ORAL | Status: DC
  Administered 2017-01-22 – 2017-01-25 (×3): 81 mg via ORAL
  Filled 2017-01-22 (×4): qty 1

## 2017-01-22 MED ORDER — LIDOCAINE-EPINEPHRINE (PF) 2 %-1:200000 IJ SOLN
INTRAMUSCULAR | Status: AC
  Filled 2017-01-22: qty 20

## 2017-01-22 MED ORDER — THROMBIN 5000 UNITS EX SOLR
CUTANEOUS | Status: AC
  Filled 2017-01-22: qty 10000

## 2017-01-22 MED ORDER — DIAZEPAM 5 MG PO TABS
5.0000 mg | ORAL_TABLET | Freq: Four times a day (QID) | ORAL | Status: DC | PRN
Start: 2017-01-22 — End: 2017-01-26

## 2017-01-22 MED ORDER — CEFAZOLIN SODIUM-DEXTROSE 2-4 GM/100ML-% IV SOLN
2.0000 g | INTRAVENOUS | Status: AC
  Administered 2017-01-22 (×2): 2 g via INTRAVENOUS

## 2017-01-22 MED ORDER — CELECOXIB 200 MG PO CAPS
ORAL_CAPSULE | ORAL | Status: AC
  Administered 2017-01-22: 200 mg via ORAL
  Filled 2017-01-22: qty 1

## 2017-01-22 MED ORDER — 0.9 % SODIUM CHLORIDE (POUR BTL) OPTIME
TOPICAL | Status: DC | PRN
  Administered 2017-01-22: 1000 mL

## 2017-01-22 MED ORDER — SODIUM CHLORIDE 0.9% FLUSH: 3.0000 mL | INTRAVENOUS | Status: DC | PRN

## 2017-01-22 MED ORDER — GLYCOPYRROLATE 0.2 MG/ML IJ SOLN
INTRAMUSCULAR | Status: DC | PRN
Start: 2017-01-22 — End: 2017-01-22
  Administered 2017-01-22 (×4): 0.2 mg via INTRAVENOUS

## 2017-01-22 MED ORDER — MEPERIDINE HCL 25 MG/ML IJ SOLN: 6.2500 mg | INTRAMUSCULAR | Status: DC | PRN

## 2017-01-22 MED ORDER — ONDANSETRON HCL 4 MG/2ML IJ SOLN
INTRAMUSCULAR | Status: DC | PRN
  Administered 2017-01-22: 4 mg via INTRAVENOUS

## 2017-01-22 MED ORDER — DOCUSATE SODIUM 100 MG PO CAPS
100.0000 mg | ORAL_CAPSULE | Freq: Two times a day (BID) | ORAL | Status: DC
  Administered 2017-01-22 – 2017-01-25 (×7): 100 mg via ORAL
  Filled 2017-01-22 (×7): qty 1

## 2017-01-22 MED ORDER — MEMANTINE HCL ER 28 MG PO CP24
28.0000 mg | ORAL_CAPSULE | Freq: Every day | ORAL | Status: DC
  Administered 2017-01-23 – 2017-01-25 (×3): 28 mg via ORAL
  Filled 2017-01-22 (×6): qty 1

## 2017-01-22 MED ORDER — BUPIVACAINE-EPINEPHRINE (PF) 0.5% -1:200000 IJ SOLN
INTRAMUSCULAR | Status: AC
  Filled 2017-01-22: qty 30

## 2017-01-22 MED ORDER — ARTIFICIAL TEARS OP OINT
TOPICAL_OINTMENT | OPHTHALMIC | Status: DC | PRN
  Administered 2017-01-22: 1 via OPHTHALMIC

## 2017-01-22 MED ORDER — INSULIN ASPART 100 UNIT/ML ~~LOC~~ SOLN
0.0000 [IU] | Freq: Three times a day (TID) | SUBCUTANEOUS | Status: DC
  Administered 2017-01-23: 2 [IU] via SUBCUTANEOUS
  Administered 2017-01-23 – 2017-01-29 (×11): 1 [IU] via SUBCUTANEOUS

## 2017-01-22 MED FILL — Sodium Chloride IV Soln 0.9%: INTRAVENOUS | Qty: 1000 | Status: AC

## 2017-01-22 MED FILL — Heparin Sodium (Porcine) Inj 1000 Unit/ML: INTRAMUSCULAR | Qty: 30 | Status: AC

## 2017-01-22 SURGICAL SUPPLY — 100 items
ADH SKN CLS APL DERMABOND .7 (GAUZE/BANDAGES/DRESSINGS) ×8
BIT DRILL LONG 3.0X30 (BIT) IMPLANT
BIT DRILL LONG 3X80 (BIT) IMPLANT
BIT DRILL LONG 4X80 (BIT) IMPLANT
BIT DRILL SHORT 3.0X30 (BIT) IMPLANT
BIT DRILL SHORT 3X80 (BIT) IMPLANT
BIT DRILL SHORT 3X80MM (BIT)
BLADE SURG 11 STRL SS (BLADE) ×2 IMPLANT
BONE MATRIX OSTEOCEL PRO MED (Bone Implant) ×4 IMPLANT
BUR MATCHSTICK NEURO 3.0X3.8 (BURR) ×2 IMPLANT
CAGE SPINAL 12X42X30 12D (Cage) ×1 IMPLANT
CAGE SPINAL 12X42X30MM 12DEG (Cage) ×1 IMPLANT
CANISTER SUCT 3000ML PPV (MISCELLANEOUS) ×4 IMPLANT
CHLORAPREP W/TINT 26ML (MISCELLANEOUS) ×8 IMPLANT
CORENT WIDE 10X22X55 (Orthopedic Implant) ×8 IMPLANT
COROENT WIDE 10X22X55 (Orthopedic Implant) IMPLANT
COROENT XL-W 8X22X55-10 (Orthopedic Implant) ×2 IMPLANT
COUNTER NEEDLE 20 DBL MAG RED (NEEDLE) ×4 IMPLANT
DECANTER SPIKE VIAL GLASS SM (MISCELLANEOUS) ×6 IMPLANT
DERMABOND ADVANCED (GAUZE/BANDAGES/DRESSINGS) ×8
DERMABOND ADVANCED .7 DNX12 (GAUZE/BANDAGES/DRESSINGS) ×2 IMPLANT
DRAPE C-ARM 42X72 X-RAY (DRAPES) ×12 IMPLANT
DRAPE C-ARMOR (DRAPES) ×4 IMPLANT
DRAPE HALF SHEET 40X57 (DRAPES) ×2 IMPLANT
DRAPE MICROSCOPE LEICA (MISCELLANEOUS) ×2 IMPLANT
DRAPE POUCH INSTRU U-SHP 10X18 (DRAPES) ×8 IMPLANT
DRAPE SHEET LG 3/4 BI-LAMINATE (DRAPES) ×4 IMPLANT
DRSG OPSITE POSTOP 4X8 (GAUZE/BANDAGES/DRESSINGS) ×4 IMPLANT
ELECT BLADE 4.0 EZ CLEAN MEGAD (MISCELLANEOUS) ×4
ELECT REM PT RETURN 9FT ADLT (ELECTROSURGICAL) ×8
ELECTRODE BLDE 4.0 EZ CLN MEGD (MISCELLANEOUS) ×2 IMPLANT
ELECTRODE REM PT RTRN 9FT ADLT (ELECTROSURGICAL) ×2 IMPLANT
GAUZE SPONGE 4X4 12PLY STRL (GAUZE/BANDAGES/DRESSINGS) IMPLANT
GAUZE SPONGE 4X4 16PLY XRAY LF (GAUZE/BANDAGES/DRESSINGS) IMPLANT
GLOVE BIO SURGEON STRL SZ7 (GLOVE) ×4 IMPLANT
GLOVE BIO SURGEON STRL SZ8 (GLOVE) ×4 IMPLANT
GLOVE BIO SURGEON STRL SZ8.5 (GLOVE) ×4 IMPLANT
GLOVE BIOGEL PI IND STRL 7.0 (GLOVE) IMPLANT
GLOVE BIOGEL PI IND STRL 7.5 (GLOVE) ×2 IMPLANT
GLOVE BIOGEL PI INDICATOR 7.0 (GLOVE) ×8
GLOVE BIOGEL PI INDICATOR 7.5 (GLOVE) ×12
GLOVE SS BIOGEL STRL SZ 7.5 (GLOVE) ×4 IMPLANT
GLOVE SUPERSENSE BIOGEL SZ 7.5 (GLOVE) ×16
GLOVE SURG SS PI 7.0 STRL IVOR (GLOVE) ×14 IMPLANT
GOWN STRL REUS W/ TWL LRG LVL3 (GOWN DISPOSABLE) IMPLANT
GOWN STRL REUS W/ TWL XL LVL3 (GOWN DISPOSABLE) IMPLANT
GOWN STRL REUS W/TWL 2XL LVL3 (GOWN DISPOSABLE) IMPLANT
GOWN STRL REUS W/TWL LRG LVL3 (GOWN DISPOSABLE) ×20
GOWN STRL REUS W/TWL XL LVL3 (GOWN DISPOSABLE) ×16
GUIDEWIRE NITINOL BEVEL TIP (WIRE) ×16 IMPLANT
HEMOSTAT POWDER KIT SURGIFOAM (HEMOSTASIS) ×4 IMPLANT
KIT BASIN OR (CUSTOM PROCEDURE TRAY) ×6 IMPLANT
KIT DILATOR XLIF 5 (KITS) IMPLANT
KIT INFUSE X SMALL 1.4CC (Orthopedic Implant) ×2 IMPLANT
KIT INFUSE XX SMALL 0.7CC (Orthopedic Implant) ×2 IMPLANT
KIT ROOM TURNOVER OR (KITS) ×4 IMPLANT
KIT SPINE MAZOR X ROBO DISP (MISCELLANEOUS) ×3 IMPLANT
KIT SURGICAL ACCESS MAXCESS 4 (KITS) ×2 IMPLANT
KIT XLIF (KITS) ×2
MODULE NVM5 NEXT GEN EMG (NEEDLE) ×2 IMPLANT
NDL DIAMOND SPRINGLESS III (NEEDLE) IMPLANT
NDL HYPO 21X1.5 SAFETY (NEEDLE) ×2 IMPLANT
NDL SPNL 18GX3.5 QUINCKE PK (NEEDLE) ×2 IMPLANT
NEEDLE DIAMOND SPRINGLESS III (NEEDLE) IMPLANT
NEEDLE HYPO 21X1.5 SAFETY (NEEDLE) ×12 IMPLANT
NEEDLE SPNL 18GX3.5 QUINCKE PK (NEEDLE) ×4 IMPLANT
NS IRRIG 1000ML POUR BTL (IV SOLUTION) ×4 IMPLANT
PACK LAMINECTOMY NEURO (CUSTOM PROCEDURE TRAY) ×8 IMPLANT
PACK UNIVERSAL I (CUSTOM PROCEDURE TRAY) ×8 IMPLANT
PIN HEAD 2.5X60MM (PIN) IMPLANT
PUTTY BONE ATTRAX 10CC STRIP (Putty) ×2 IMPLANT
PUTTY BONE ATTRAX 5CC STRIP (Putty) ×2 IMPLANT
ROD RELINE MAS LORD 5.5X100MM (Rod) ×2 IMPLANT
ROD RELINE MAS LORD 5.5X120MM (Rod) ×2 IMPLANT
RUBBERBAND STERILE (MISCELLANEOUS) ×4 IMPLANT
SCREW BONE 4.5X27MM (Screw) ×4 IMPLANT
SCREW LOCK RELINE 5.5 TULIP (Screw) ×16 IMPLANT
SCREW RELINE MAS RED 7.5X50MM (Screw) ×1 IMPLANT
SCREW RELINE MASS RED 7.5X55MM (Screw) ×1 IMPLANT
SCREW RELINE RED 6.5X45MM POLY (Screw) ×10 IMPLANT
SCREW RELINE RED 6.5X50MM POLY (Screw) ×2 IMPLANT
SCREW SCHANZ SA 4.0MM IMPLANT
SPONGE INTESTINAL PEANUT (DISPOSABLE) ×10 IMPLANT
SPONGE LAP 18X18 X RAY DECT (DISPOSABLE) ×6 IMPLANT
SPONGE SURGIFOAM ABS GEL 100 (HEMOSTASIS) ×2 IMPLANT
STAPLER VISISTAT 35W (STAPLE) ×2 IMPLANT
SUT PROLENE 5 0 C1 (SUTURE) ×2 IMPLANT
SUT STRATAFIX 1PDS 45CM VIOLET (SUTURE) ×2 IMPLANT
SUT STRATAFIX MNCRL+ 3-0 PS-2 (SUTURE) ×8
SUT STRATAFIX MONOCRYL 3-0 (SUTURE) ×8
SUT VIC AB 0 CT1 27 (SUTURE) ×16
SUT VIC AB 0 CT1 27XBRD ANBCTR (SUTURE) ×4 IMPLANT
SUT VIC AB 2-0 CT1 27 (SUTURE) ×16
SUT VIC AB 2-0 CT1 27XBRD (SUTURE) ×2 IMPLANT
SUTURE STRATFX MNCRL+ 3-0 PS-2 (SUTURE) IMPLANT
SYR 30ML LL (SYRINGE) ×6 IMPLANT
TOWEL OR 17X24 6PK STRL BLUE (TOWEL DISPOSABLE) ×4 IMPLANT
TOWEL OR 17X26 10 PK STRL BLUE (TOWEL DISPOSABLE) ×4 IMPLANT
TRAY FOLEY W/METER SILVER 16FR (SET/KITS/TRAYS/PACK) ×4 IMPLANT
WATER STERILE IRR 1000ML POUR (IV SOLUTION) ×8 IMPLANT

## 2017-01-22 NOTE — Transfer of Care (Cosign Needed)
Immediate Anesthesia Transfer of Care Note  Patient: Joseph Hebert  Procedure(s) Performed: Procedure(s) with comments: LUMBAR FIVE-SACRUM ONE ANTERIOR LUMBAR INTERBODY FUSION WITH ABDOMINAL APPROACH BY DR CAIN (N/A) LUMBAR THREE-FOUR, LUMBAR FOUR-FIVE ANTERIOR LATERAL INTERBODY FUSION (N/A) - L3-4 L4-5 Lateral interbody fusion LUMBAR THREE-SACRAL ONE PERCUTANEOUS PEDICLE SCREW FIXATION WITH ROBOTIC ASSISTANCE (N/A) - L3 to S1 Percutaneous pedicle screw fixation APPLICATION OF ROBOTIC ASSISTANCE FOR SPINAL PROCEDURE (N/A) ABDOMINAL EXPOSURE (N/A)  Patient Location: PACU  Anesthesia Type:General  Level of Consciousness: awake, alert  and oriented  Airway & Oxygen Therapy: Patient Spontanous Breathing and Patient connected to nasal cannula oxygen  Post-op Assessment: Report given to RN and Post -op Vital signs reviewed and stable  Post vital signs: Reviewed and stable  Last Vitals:  Vitals:   01/22/17 0620 01/22/17 1642  BP: (!) 173/74   Pulse: (!) 57   Resp: 18   Temp: 36.4 C (P) 36.3 C    Last Pain:  Vitals:   01/22/17 0620  TempSrc: Oral         Complications: No apparent anesthesia complications

## 2017-01-22 NOTE — Anesthesia Preprocedure Evaluation (Signed)
Anesthesia Evaluation  Patient identified by MRN, date of birth, ID band  Airway Mallampati: I  TM Distance: >3 FB Neck ROM: Full    Dental   Pulmonary COPD, former smoker,    Pulmonary exam normal        Cardiovascular hypertension, Pt. on medications + CAD  Normal cardiovascular exam     Neuro/Psych    GI/Hepatic   Endo/Other  diabetes  Renal/GU Renal InsufficiencyRenal disease     Musculoskeletal   Abdominal   Peds  Hematology   Anesthesia Other Findings   Reproductive/Obstetrics                             Anesthesia Physical Anesthesia Plan  ASA: III  Anesthesia Plan: General   Post-op Pain Management:    Induction: Intravenous  Airway Management Planned: Oral ETT  Additional Equipment: Arterial line  Intra-op Plan:   Post-operative Plan: Extubation in OR  Informed Consent: I have reviewed the patients History and Physical, chart, labs and discussed the procedure including the risks, benefits and alternatives for the proposed anesthesia with the patient or authorized representative who has indicated his/her understanding and acceptance.     Plan Discussed with: CRNA and Surgeon  Anesthesia Plan Comments:         Anesthesia Quick Evaluation

## 2017-01-22 NOTE — Brief Op Note (Signed)
01/22/2017  4:15 PM  PATIENT:  Vicenta Aly  71 y.o. male  PRE-OPERATIVE DIAGNOSIS:  Other spondylosis with radiculopathy, Lumbosacral  POST-OPERATIVE DIAGNOSIS:  Other spondylosis with radiculopathy, Lumbosacral  PROCEDURE:  Procedure(s) with comments: LUMBAR FIVE-SACRUM ONE ANTERIOR LUMBAR INTERBODY FUSION WITH ABDOMINAL APPROACH BY DR CAIN (N/A) LUMBAR THREE-FOUR, LUMBAR FOUR-FIVE ANTERIOR LATERAL INTERBODY FUSION (N/A) - L3-4 L4-5 Lateral interbody fusion LUMBAR THREE-SACRAL ONE PERCUTANEOUS PEDICLE SCREW FIXATION WITH ROBOTIC ASSISTANCE (N/A) - L3 to S1 Percutaneous pedicle screw fixation APPLICATION OF ROBOTIC ASSISTANCE FOR SPINAL PROCEDURE (N/A) ABDOMINAL EXPOSURE (N/A)  SURGEON:  Surgeon(s) and Role: Panel 1:    * Kevan Ny Eknoor Novack, MD - Primary    * Newman Pies, MD - Assisting  Panel 2:    * [name], MD - Primary  PHYSICIAN ASSISTANT:   ASSISTANTS: Newman Pies, MD; Ferne Reus, MD   ANESTHESIA:   general  EBL:  Total I/O In: 3403 [I.V.:3800; IV Piggyback:250] Out: 28 [Urine:215; Blood:600]  BLOOD ADMINISTERED:none  DRAINS: none   LOCAL MEDICATIONS USED:  MARCAINE    and LIDOCAINE   SPECIMEN:  No Specimen  DISPOSITION OF SPECIMEN:  N/A  COUNTS:  YES  TOURNIQUET:  * No tourniquets in log *  DICTATION: .Dragon Dictation  PLAN OF CARE: Admit to inpatient   PATIENT DISPOSITION:  PACU - hemodynamically stable.   Delay start of Pharmacological VTE agent (>24hrs) due to surgical blood loss or risk of bleeding: yes

## 2017-01-22 NOTE — Anesthesia Procedure Notes (Cosign Needed)
Date/Time: 01/22/2017 7:36 AM Performed by: Tamala Fothergill Pre-anesthesia Checklist: Patient identified, Emergency Drugs available, Suction available, Patient being monitored and Timeout performed Patient Re-evaluated:Patient Re-evaluated prior to inductionOxygen Delivery Method: Circle system utilized Preoxygenation: Pre-oxygenation with 100% oxygen Intubation Type: IV induction Ventilation: Mask ventilation without difficulty Laryngoscope Size: Glidescope Grade View: Grade I Tube type: Oral Tube size: 7.5 mm Number of attempts: 1 Airway Equipment and Method: Stylet and Video-laryngoscopy Placement Confirmation: ETT inserted through vocal cords under direct vision,  positive ETCO2 and breath sounds checked- equal and bilateral Secured at: 23 cm Tube secured with: Tape Dental Injury: Teeth and Oropharynx as per pre-operative assessment  Difficulty Due To: Difficulty was unanticipated Comments: Intubation by Elsie Ra. Glidescope used due to hx of it being used when previous surgery.

## 2017-01-22 NOTE — Op Note (Signed)
    Patient name: Joseph Hebert MRN: [id number] DOB: [birth year] Sex: male  01/22/2017 Pre-operative Diagnosis: lumbosacral spondylosis with radiculopathy  Post-operative diagnosis:  Same Surgeon:  Erlene Quan C. [name], MD Procedure Performed: exposure for L5-S1 alif  Indications:  71yo WM with history of bilateral common iliac artery stent. He now has back pain with numbness in his right lower extremity is indicated for surgery with exposure of L5-S1 for a left.  Findings: Stents were palpable in place with pulsatility noted. The vein was densely adherent to this dense at the bifurcation. Following exposure and repair there were palpable femoral pulses.   Procedure:  The patient was identified in the holding area and taken to the operating room where his placed supine on the operative table general anesthesia was induced he was sterilely prepped and draped in usual fashion clipped of hair given antibiotics and sterilely prepped and draped in usual fashion. We then marked out our incision under fluoroscopy. A vertical incision was then made and dissected down through skin and subcutaneous tissue to the anterior rectus sheath. This was divided. The rectus muscle was then mobilized medially and laterally. Posterior rectus sheath was divided. There was a rent in the peritoneum repaired with 2-0 Vicryl suture. The retrograde peroneal was then mobilized bluntly over to the level of the spine. Stents were easily palpable there was pulsatility throughout. We then placed our self-retaining retractor. There was difficulty retracting the vein given its relationship to the iliac artery stents. There was a rent made in the vein repaired with 5-0 Prolene suture. Ultimately we were able to gain exposure and Dr. Cyndy Freeze then prepared L5-S1 interbody space. The retractors were released was noted to be hemostatic. Fascia was closed with #1 suture to obtain his tissue and skin to be closed by Dr. Cyndy Freeze. Patient tolerated procedure  well without immediate complication.     Mariame Rybolt C. [name], MD [facility] Office: [phone] Pager: [phone]

## 2017-01-22 NOTE — H&P (Signed)
Patient ID: Joseph Hebert, male   DOB: 01-14-1946, 71 y.o.   MRN: [id number]  Reason for Consult: New Evaluation (ALIF)   Referred by Ditty, Kevan Ny, *  Subjective:     HPI:  Joseph Hebert is a 71 y.o. male with history of left lower extremity bypass and bilateral common iliac artery stents. He is now indicated for spinal surgery for back pain. He has not had any issues with his bypass surgeries and remains on aspirin. Today in the office his only complaint is his back pain.      Past Medical History:  Diagnosis Date  . Arrhythmia    takes metoprolol daily  . Arthritis   . Asthma   . CAD (coronary artery disease)   . Carotid artery occlusion    with Claudication  . Chronic back pain    stenosis  . COPD (chronic obstructive pulmonary disease) (HCC)    Spirva daily and Albuterol as needed  . Diabetes mellitus without complication (HCC)    Type 2 diet controlled.Never been on meds  . GERD (gastroesophageal reflux disease)    takes Protonix daily  . Gout    takes Uloric daily  . Head injury    as a child  . History of colon polyps    benign  . History of gastric ulcer   . History of hiatal hernia   . Hyperlipidemia    takes Atorvastatin and Fenofibrate daily  . Hypertension    takes Lisinopril,Amlodipine,and Hydralazine daily  . Impaired memory    takes Namenda daily  . Numbness    lower left leg and upper right thigh  . Pneumonia    hx of-couple of yrs ago  . Shortness of breath dyspnea    with exertion  . Tuberculosis 2006    9 months   . Vertigo          Family History  Problem Relation Age of Onset  . Diabetes Mother   . Hyperlipidemia Father   . Hypertension Father   . Other Father     Right Leg Amputation  . Heart disease Sister     Aneyrism         Past Surgical History:  Procedure Laterality Date  . CAROTID BODY TUMOR EXCISION Left 09/21/2015   Procedure: EXCISE LEFT NECK NODULE  WITH LOCAL ;  Surgeon: [name], MD;  Location: [facility];  Service: Vascular;  Laterality: Left;  . CAROTID ENDARTERECTOMY  Nov. 15,2011   LEFT cea  . cataract surgery Bilateral   . COLONOSCOPY    . FEMORAL-POPLITEAL BYPASS GRAFT Left 08/23/2015   Procedure: LEFT FEMORAL-POPLITEAL ARTERY BYPASS WITH GORETEX GRAFT;  Surgeon: [name], MD;  Location: [facility];  Service: Vascular;  Laterality: Left;  . JOINT REPLACEMENT  1980   RIGHT  knee  . LUMBAR EPIDURAL INJECTION    . LUMBAR LAMINECTOMY/DECOMPRESSION MICRODISCECTOMY Right 02/17/2016   Procedure: Laminectomy and Foraminotomy - Lumbar four-five right;  Surgeon: [name], MD;  Location: [facility];  Service: Neurosurgery;  Laterality: Right;  right  . PERIPHERAL VASCULAR CATHETERIZATION N/A 06/24/2015   Procedure: Abdominal Aortogram;  Surgeon: [name], MD;  Location: [facility];  Service: Cardiovascular;  Laterality: N/A;  . Stents  Aug.  23, 1999   Bilateral iliofemoral  stents, [place].    Short Social History:        Social History  Substance Use Topics  . Smoking status: Former  Smoker    Packs/day: 0.30    Types: Cigarettes  . Smokeless tobacco: Never Used     Comment: hasn't smoked in 3 days  . Alcohol use No    No Known Allergies        Current Outpatient Prescriptions  Medication Sig Dispense Refill  . amLODipine (NORVASC) 10 MG tablet Take 10 mg by mouth daily.    [name] aspirin EC 81 MG tablet Take 81 mg by mouth daily.    [name] atorvastatin (LIPITOR) 80 MG tablet Take 80 mg by mouth every evening.     . fenofibrate (TRICOR) 145 MG tablet Take 145 mg by mouth at bedtime.     . furosemide (LASIX) 40 MG tablet Take 40 mg by mouth daily.    . hydrALAZINE (APRESOLINE) 25 MG tablet Take 25 mg by mouth 2 (two) times daily.  0  . lisinopril (PRINIVIL,ZESTRIL) 40 MG tablet Take 40 mg by mouth daily.    . memantine (NAMENDA XR) 28 MG CP24 24 hr capsule Take 28  mg by mouth daily.    . metoprolol succinate (TOPROL-XL) 50 MG 24 hr tablet Take 50 mg by mouth daily. Take with or immediately following a meal.    . ULORIC 40 MG tablet Take 40 mg by mouth daily.  2  . acetaminophen-codeine (TYLENOL #2) 300-15 MG tablet Take 1 tablet by mouth every 4 (four) hours as needed for moderate pain. (Patient not taking: Reported on 11/07/2016) 10 tablet 0  . azelastine (ASTELIN) 137 MCG/SPRAY nasal spray Place 1 spray into both nostrils 2 (two) times daily as needed for rhinitis or allergies. Use in each nostril as needed for allergy symptoms    . DOCOSAHEXAENOIC ACID PO Take by mouth.    . hydroxypropyl methylcellulose / hypromellose (ISOPTO TEARS / GONIOVISC) 2.5 % ophthalmic solution Place 1 drop into both eyes 4 (four) times daily.    . Influenza Vac Split High-Dose 0.5 ML SUSY     . Multiple Vitamin (MULTIVITAMIN) tablet Take 1 tablet by mouth daily.    . nabumetone (RELAFEN) 500 MG tablet Take 500 mg by mouth 2 (two) times daily.   1  . pantoprazole (PROTONIX) 40 MG tablet Take 40 mg by mouth daily.  12   No current facility-administered medications for this visit.     Review of Systems  Constitutional:  Constitutional negative. HENT: HENT negative.  Eyes: Eyes negative.  Respiratory: Respiratory negative.  Cardiovascular: Cardiovascular negative.  GI: Gastrointestinal negative.  Musculoskeletal: Positive for back pain.  Skin: Skin negative.  Neurological: Neurological negative. Hematologic: Hematologic/lymphatic negative.  Psychiatric: Psychiatric negative.        Objective:  Objective       Vitals:   11/07/16 1207 11/07/16 1209  BP: (!) 154/74 (!) 146/80  Pulse: 66 62  Resp: 18   Temp: 97.9 F (36.6 C)   SpO2: 97%   Weight: 220 lb (99.8 kg)   Height: 5' 9.5" (1.765 m)    Body mass index is 32.02 kg/m.  Physical Exam  Constitutional: He is oriented to person, place, and time. He appears well-developed.    HENT:  Head: Atraumatic.  Neck: Normal range of motion.  Cardiovascular: Normal rate.   2+ femoral pulses  Pulmonary/Chest: Breath sounds normal.  Abdominal: Bowel sounds are normal.  Musculoskeletal: Normal range of motion. He exhibits no edema.  Neurological: He is alert and oriented to person, place, and time.  Skin: Skin is warm and dry.  Psychiatric: He has a  normal mood and affect. His behavior is normal. Judgment and thought content normal.    Data: MRI reviewed.     Assessment/Plan:   71 year old white male known to practice followed by Dr. fields for carotid and lower extremity arterial disease. He is now indicated for exposure for spine surgery. I again discussed with him the difficulty given his history of bilateral common iliac artery stents. Will proceed with exposure for L5-S1 and possible L4-L5  [name] MD [facility]

## 2017-01-22 NOTE — H&P (Signed)
CC:  No chief complaint on file. Back and leg pain  HPI: Joseph Hebert is a 71 y.o. male with lumbosacral spondylosis with radiculopathy presents for anterior lumbar interbody fusion and pedicle screw fixation with posterolateral arthrodesis.  No changes since his last visit with me in clinic.  PMH: Past Medical History:  Diagnosis Date  . Anemia   . Arrhythmia    takes metoprolol daily  . Arthritis   . Asthma   . CAD (coronary artery disease)   . Carotid artery occlusion    with Claudication  . Chronic back pain    stenosis  . Chronic kidney disease   . COPD (chronic obstructive pulmonary disease) (HCC)    Spirva daily and Albuterol as needed  . Dementia   . Depression   . Diabetes mellitus without complication (HCC)    Type 2 diet controlled.Never been on meds  . Gout    takes Uloric daily  . Head injury    as a child  . History of colon polyps    benign  . History of gastric ulcer   . History of hiatal hernia   . Hyperlipidemia    takes Atorvastatin and Fenofibrate daily  . Hypertension    takes Lisinopril,Amlodipine,and Hydralazine daily  . Impaired memory    takes Namenda daily  . Numbness    lower left leg and upper right thigh  . Pneumonia    hx of-couple of yrs ago  . Shortness of breath dyspnea    with exertion  . Tuberculosis 2006    9 months   . Vertigo     PSH: Past Surgical History:  Procedure Laterality Date  . CAROTID BODY TUMOR EXCISION Left 09/21/2015   Procedure: EXCISE LEFT NECK NODULE WITH LOCAL ;  Surgeon: [name], MD;  Location: [facility];  Service: Vascular;  Laterality: Left;  . CAROTID ENDARTERECTOMY  Nov. 15,2011   LEFT cea  . cataract surgery Bilateral   . COLONOSCOPY    . EYE SURGERY    . FEMORAL-POPLITEAL BYPASS GRAFT Left 08/23/2015   Procedure: LEFT FEMORAL-POPLITEAL ARTERY BYPASS WITH GORETEX GRAFT;  Surgeon: [name], MD;  Location: [facility];  Service: Vascular;  Laterality: Left;  . JOINT REPLACEMENT  1980   RIGHT  knee  . LUMBAR EPIDURAL INJECTION    . LUMBAR LAMINECTOMY/DECOMPRESSION MICRODISCECTOMY Right 02/17/2016   Procedure: Laminectomy and Foraminotomy - Lumbar four-five right;  Surgeon: Kevan Ny Franci Oshana, MD;  Location: [facility];  Service: Neurosurgery;  Laterality: Right;  right  . PERIPHERAL VASCULAR CATHETERIZATION N/A 06/24/2015   Procedure: Abdominal Aortogram;  Surgeon: [name], MD;  Location: [facility];  Service: Cardiovascular;  Laterality: N/A;  . Stents  Aug.  23, 1999   Bilateral iliofemoral  stents, [place].  . TONSILLECTOMY      SH: Social History  Substance Use Topics  . Smoking status: Former Smoker    Packs/day: 0.30    Types: Cigarettes    Quit date: 07/02/2016  . Smokeless tobacco: Never Used     Comment: hasn't smoked in 3 days  . Alcohol use No    MEDS: Prior to Admission medications   Medication Sig Start Date End Date Taking? Authorizing Provider  amLODipine (NORVASC) 10 MG tablet Take 10 mg by mouth daily.   Yes Historical Provider, MD  aspirin EC 81 MG tablet Take 81 mg by mouth daily.   Yes Historical Provider, MD  atorvastatin (LIPITOR) 80 MG  tablet Take 80 mg by mouth at bedtime.    Yes Historical Provider, MD  fenofibrate (TRICOR) 145 MG tablet Take 145 mg by mouth at bedtime.    Yes Historical Provider, MD  furosemide (LASIX) 40 MG tablet Take 40 mg by mouth daily.   Yes Historical Provider, MD  hydrALAZINE (APRESOLINE) 25 MG tablet Take 25 mg by mouth 2 (two) times daily. 05/17/15  Yes Historical Provider, MD  lisinopril (PRINIVIL,ZESTRIL) 40 MG tablet Take 40 mg by mouth daily.   Yes Historical Provider, MD  memantine (NAMENDA XR) 28 MG CP24 24 hr capsule Take 28 mg by mouth daily.   Yes Historical Provider, MD  metoprolol succinate (TOPROL-XL) 50 MG 24 hr tablet Take 50 mg by mouth daily. Take with or immediately following a meal.   Yes Historical Provider, MD  ULORIC 80 MG TABS Take 80 mg by mouth daily. 11/12/16  Yes  Historical Provider, MD    ALLERGY: Allergies  Allergen Reactions  . No Known Allergies     ROS: ROS  NEUROLOGIC EXAM: Awake, alert, oriented Memory and concentration grossly intact Speech fluent, appropriate CN grossly intact Motor exam: Upper Extremities Deltoid Bicep Tricep Grip  Right 5/5 5/5 5/5 5/5  Left 5/5 5/5 5/5 5/5   Lower Extremity IP Quad PF DF EHL  Right 5/5 5/5 5/5 5/5 5/5  Left 5/5 5/5 5/5 5/5 5/5   Sensation grossly intact to LT  IMAGING: No new imaging  IMPRESSION: - 71 y.o. male with lumbosacral spondylosis and radiculopathy.  He is neurologically intact.  PLAN: - We have had a long discussion about the risks, benefits, and alternatives to surgery. - L3-S1 anterior lumbar interbody fusion with posterior pedicle screw fixation and posterolateral arthrodesis.

## 2017-01-23 ENCOUNTER — Encounter (HOSPITAL_COMMUNITY): Payer: Self-pay | Admitting: Neurological Surgery

## 2017-01-23 LAB — POCT I-STAT 7, (LYTES, BLD GAS, ICA,H+H)
ACID-BASE DEFICIT: 4 mmol/L — AB (ref 0.0–2.0)
Acid-base deficit: 3 mmol/L — ABNORMAL HIGH (ref 0.0–2.0)
BICARBONATE: 21.8 mmol/L (ref 20.0–28.0)
Bicarbonate: 23 mmol/L (ref 20.0–28.0)
Calcium, Ion: 1.2 mmol/L (ref 1.15–1.40)
Calcium, Ion: 1.17 mmol/L (ref 1.15–1.40)
HCT: 30 % — ABNORMAL LOW (ref 39.0–52.0)
HEMATOCRIT: 30 % — AB (ref 39.0–52.0)
HEMOGLOBIN: 10.2 g/dL — AB (ref 13.0–17.0)
Hemoglobin: 10.2 g/dL — ABNORMAL LOW (ref 13.0–17.0)
O2 Saturation: 96 %
O2 Saturation: 100 %
PCO2 ART: 42.3 mmHg (ref 32.0–48.0)
PH ART: 7.332 — AB (ref 7.350–7.450)
PH ART: 7.336 — AB (ref 7.350–7.450)
PO2 ART: 82 mmHg — AB (ref 83.0–108.0)
POTASSIUM: 4.8 mmol/L (ref 3.5–5.1)
Patient temperature: 35.1
Potassium: 5.1 mmol/L (ref 3.5–5.1)
SODIUM: 140 mmol/L (ref 135–145)
Sodium: 141 mmol/L (ref 135–145)
TCO2: 24 mmol/L (ref 0–100)
TCO2: 23 mmol/L (ref 0–100)
pCO2 arterial: 39.9 mmHg (ref 32.0–48.0)
pO2, Arterial: 232 mmHg — ABNORMAL HIGH (ref 83.0–108.0)

## 2017-01-23 LAB — CBC
HCT: 29.4 % — ABNORMAL LOW (ref 39.0–52.0)
Hemoglobin: 9.3 g/dL — ABNORMAL LOW (ref 13.0–17.0)
MCH: 29.2 pg (ref 26.0–34.0)
MCHC: 31.6 g/dL (ref 30.0–36.0)
MCV: 92.2 fL (ref 78.0–100.0)
Platelets: 259 10*3/uL (ref 150–400)
RBC: 3.19 MIL/uL — ABNORMAL LOW (ref 4.22–5.81)
RDW: 15.3 % (ref 11.5–15.5)
WBC: 8.7 10*3/uL (ref 4.0–10.5)

## 2017-01-23 LAB — BASIC METABOLIC PANEL
Anion gap: 12 (ref 5–15)
BUN: 38 mg/dL — ABNORMAL HIGH (ref 6–20)
CO2: 23 mmol/L (ref 22–32)
Calcium: 8 mg/dL — ABNORMAL LOW (ref 8.9–10.3)
Chloride: 103 mmol/L (ref 101–111)
Creatinine, Ser: 3.08 mg/dL — ABNORMAL HIGH (ref 0.61–1.24)
GFR calc Af Amer: 22 mL/min — ABNORMAL LOW (ref >60)
GFR calc non Af Amer: 19 mL/min — ABNORMAL LOW (ref >60)
Glucose, Bld: 113 mg/dL — ABNORMAL HIGH (ref 65–99)
Potassium: 4.5 mmol/L (ref 3.5–5.1)
Sodium: 138 mmol/L (ref 135–145)

## 2017-01-23 LAB — GLUCOSE, CAPILLARY
GLUCOSE-CAPILLARY: 136 mg/dL — AB (ref 65–99)
Glucose-Capillary: 154 mg/dL — ABNORMAL HIGH (ref 65–99)
Glucose-Capillary: 122 mg/dL — ABNORMAL HIGH (ref 65–99)
Glucose-Capillary: 138 mg/dL — ABNORMAL HIGH (ref 65–99)

## 2017-01-23 LAB — APTT: aPTT: 37 s — ABNORMAL HIGH (ref 24–36)

## 2017-01-23 LAB — PROTIME-INR
INR: 1.16
Prothrombin Time: 14.8 seconds (ref 11.4–15.2)

## 2017-01-23 MED ORDER — SODIUM CHLORIDE 0.9 % IV BOLUS (SEPSIS)
1000.0000 mL | Freq: Once | INTRAVENOUS | Status: AC
  Administered 2017-01-23: 1000 mL via INTRAVENOUS

## 2017-01-23 NOTE — Consult Note (Signed)
[facility] CM Primary Care Navigator  01/23/2017  Joseph Hebert 07/12/1946 [id number]  Met with patient at the bedside to identify possible discharge needs. Patient reports having worsening lower back pain that had led to this admission/surgery.  Patient endorses Dr Douglas Schultz with [facility] as the primary care provider.    Patient shared using [facility] pharmacy in [place] to obtain medications without any problem.   Patient manages his own medications at home using "pill box" system every week.   He states that he drives prior to admission/ surgery, however, wife ([name]) will be providing transportation to his doctors' appointments as needed after discharge.  Wife is his primary caregiver at home.   Anticipated discharge plan is skilled nursing facility per PT recommendation. He mentioned that wife will not able to physically assist him to get up, therefore, will need further strengthening before returning back home.  Patient voiced understanding to call primary care provider's office when he goes back home, for a post discharge follow-up appointment within a week or sooner if needed. Patient letter (with PCP's contact number) was provided as a reminder.  Patient reports that he has Type 2 DM with recent A1c of 6.3. Primary care provider monitors him every 3 months as stated. He states being in DM classes for 6 weeks and not currently on any DM medications.   He denies further needs with health management at the moment. [facility] care management contact information provided for future needs that may arise.  For additional questions please contact:   A. , BSN, RN-BC [facility] PRIMARY CARE Navigator Cell: [phone] 

## 2017-01-23 NOTE — Evaluation (Signed)
Occupational Therapy Evaluation Patient Details Name: Joseph Hebert MRN: [id number] DOB: 04/10/1946 Today's Date: 01/23/2017    History of Present Illness 71 yo male s/p L 3-4 L4-5 ALIF L 3-s1 pedicule screw fixation with robotic assistance   Past Medical History:  Diagnosis Date  . Anemia   . Arrhythmia    takes metoprolol daily  . Arthritis   . Asthma   . CAD (coronary artery disease)   . Carotid artery occlusion    with Claudication  . Chronic back pain    stenosis  . Chronic kidney disease   . COPD (chronic obstructive pulmonary disease) (HCC)    Spirva daily and Albuterol as needed  . Dementia   . Depression   . Diabetes mellitus without complication (HCC)    Type 2 diet controlled.Never been on meds  . Gout    takes Uloric daily  . Head injury    as a child  . History of colon polyps    benign  . History of gastric ulcer   . History of hiatal hernia   . Hyperlipidemia    takes Atorvastatin and Fenofibrate daily  . Hypertension    takes Lisinopril,Amlodipine,and Hydralazine daily  . Impaired memory    takes Namenda daily  . Numbness    lower left leg and upper right thigh  . Pneumonia    hx of-couple of yrs ago  . Shortness of breath dyspnea    with exertion  . Tuberculosis 2006    9 months   . Vertigo       Clinical Impression   Patient is s/p ALIF L3-4 L4-5 with L3-s1 pedicule screw fixation surgery resulting in functional limitations due to the deficits listed below (see OT problem list). Pt noted to have BP 73/41 after attempting to stand x3. Pt reports "yes" when asked if patient was dizzy. Pt just reports feeling like "I need to get back in the bed" Pt does not provide verbalization of symptoms and with closed answer responses when asked. Pt requires close monitoring by staff for symptoms.  Patient will benefit from skilled OT acutely to increase independence and safety with ADLS to allow discharge SNF. Pt will likely decline SNF level care as  he is already requesting to go home today. Pt unable to complete bed level mobility at this time.      Follow Up Recommendations  SNF    Equipment Recommendations  Other (comment) (defer to next venue)    Recommendations for Other Services       Precautions / Restrictions Precautions Precautions: Back Precaution Comments: handout provided and reviewed to adls  Required Braces or Orthoses: Spinal Brace Spinal Brace: Lumbar corset;Applied in sitting position      Mobility Bed Mobility Overal bed mobility: Needs Assistance Bed Mobility: Supine to Sit;Sit to Supine     Supine to sit: Max assist Sit to supine: Max assist   General bed mobility comments: Pt requires BIL LE positioned to EOB and then long sit due to pt c/o pain radiating down L leg at groining area  Transfers Overall transfer level: Needs assistance   Transfers: Sit to/from Stand Sit to Stand: From elevated surface;Max assist         General transfer comment: pt completed sit<>Stand x3 this session with encouragement. pt states "let me try. I can't do a damn thing with this leg right here. I am sorry I am just weak" pt noted to have BP decr  Balance Overall balance assessment: Needs assistance Sitting-balance support: Bilateral upper extremity supported;Feet supported Sitting balance-Leahy Scale: Poor     Standing balance support: Bilateral upper extremity supported;During functional activity Standing balance-Leahy Scale: Poor                              ADL Overall ADL's : Needs assistance/impaired Eating/Feeding: Set up;Bed level   Grooming: Wash/dry face;Bed level   Upper Body Bathing: Maximal assistance   Lower Body Bathing: Total assistance                         General ADL Comments: Pt required bed elevated with hip extension and sara stedy to come to static standing     Vision     Perception     Praxis      Pertinent Vitals/Pain Pain Assessment:  0-10 Pain Score: 4  Pain Location: belly button to groining area Pain Descriptors / Indicators: Discomfort Pain Intervention(s): Monitored during session;Repositioned     Hand Dominance Right   Extremity/Trunk Assessment Upper Extremity Assessment Upper Extremity Assessment: Overall WFL for tasks assessed   Lower Extremity Assessment Lower Extremity Assessment: Generalized weakness;Defer to PT evaluation;LLE deficits/detail LLE Deficits / Details: pt demonstrates plantar flexion/ dorsiflexion but weakness with hip flexion upon bed level and EOB assessment   Cervical / Trunk Assessment Cervical / Trunk Assessment: Other exceptions (s/p surg)   Communication Communication Communication: No difficulties   Cognition Arousal/Alertness: Awake/alert Behavior During Therapy: WFL for tasks assessed/performed Overall Cognitive Status: Within Functional Limits for tasks assessed                     General Comments       Exercises       Shoulder Instructions      Home Living Family/patient expects to be discharged to:: Private residence Living Arrangements: Spouse/significant other Available Help at Discharge: Family;Available 24 hours/day Type of Home: House Home Access: Stairs to enter [employer] of Steps: 2 Entrance Stairs-Rails: None Home Layout: Two level;Laundry or work area in basement;Able to live on main level with bedroom/bathroom Alternate Therapist, sports of Steps: flight Alternate Level Stairs-Rails: Left;Right;Can reach both Bathroom Shower/Tub: Gaffer;Door   [employer] Toilet: Standard     Home Equipment: Environmental consultant - 2 wheels;Cane - single point;Bedside commode;Hand held shower head          Prior Functioning/Environment Level of Independence: Independent        Comments: driving         OT Problem List: Decreased strength;Decreased activity tolerance;Impaired balance (sitting and/or standing);Decreased safety  awareness;Decreased knowledge of use of DME or AE;Decreased knowledge of precautions;Cardiopulmonary status limiting activity;Obesity;Pain   OT Treatment/Interventions: Self-care/ADL training;Therapeutic exercise;Energy conservation;DME and/or AE instruction;Therapeutic activities;Patient/family education;Balance training    OT Goals(Current goals can be found in the care plan section) Acute Rehab OT Goals Patient Stated Goal: ready to go home OT Goal Formulation: With patient Time For Goal Achievement: 02/06/17 Potential to Achieve Goals: Good  OT Frequency: Min 3X/week   Barriers to D/C:            Co-evaluation              End of Session Equipment Utilized During Treatment: Gait belt;Back brace;Oxygen Nurse Communication: Mobility status;Precautions;Need for lift equipment  Activity Tolerance: Patient limited by pain;Other (comment) (BP decr) Patient left: in bed;with call bell/phone within reach;with bed alarm set  Time: 4734-0370 OT Time Calculation (min): 31 min Charges:  OT General Charges $OT Visit: 1 Procedure OT Evaluation $OT Eval High Complexity: 1 Procedure OT Treatments $Self Care/Home Management : 8-22 mins G-Codes:    Peri Maris 02/17/17, 7:35 AM    Jeri Modena   OTR/L Pager: [phone] Office: [phone] .

## 2017-01-23 NOTE — Progress Notes (Signed)
Pt received a 1000 ml/ Saline bonus. [name] bp dropped to 73/41. MD notified during rounding. Pt does not report any dizziness or being light headed.

## 2017-01-23 NOTE — Progress Notes (Signed)
  Progress Note    01/23/2017 9:37 AM 1 Day Post-Op  Subjective:  No acute issues  Vitals:   01/23/17 0059 01/23/17 0600  BP: (!) 121/50 (!) 131/53  Pulse: 65 80  Resp: 18 18  Temp: 97.9 F (36.6 C) 97.9 F (36.6 C)    Physical Exam: aaox3 Abdomen soft with mild distension Incision cdi with dermabond Both feet are warm and femoral pulses palpable  CBC    Component Value Date/Time   WBC 8.7 01/23/2017 0338   RBC 3.19 (L) 01/23/2017 0338   HGB 9.3 (L) 01/23/2017 0338   HCT 29.4 (L) 01/23/2017 0338   PLT 259 01/23/2017 0338   MCV 92.2 01/23/2017 0338   MCH 29.2 01/23/2017 0338   MCHC 31.6 01/23/2017 0338   RDW 15.3 01/23/2017 0338    BMET    Component Value Date/Time   NA 138 01/23/2017 0338   K 4.5 01/23/2017 0338   CL 103 01/23/2017 0338   CO2 23 01/23/2017 0338   GLUCOSE 113 (H) 01/23/2017 0338   BUN 38 (H) 01/23/2017 0338   CREATININE 3.08 (H) 01/23/2017 0338   CALCIUM 8.0 (L) 01/23/2017 0338   GFRNONAA 19 (L) 01/23/2017 0338   GFRAA 22 (L) 01/23/2017 0338    INR    Component Value Date/Time   INR 1.16 01/23/2017 0338     Intake/Output Summary (Last 24 hours) at 01/23/17 0937 Last data filed at 01/23/17 8657  Gross per 24 hour  Intake             3250 ml  Output             1665 ml  Net             1585 ml     Assessment:  71 y.o. male is s/p exposure for L5-S1 alif, previous history of kissing iliac stents.   Plan: Encouraged to be slow with diet given distension Ambulation per Dr. Cyndy Freeze He will keep his regular f/u with Dr. [name] unless issues arise  Erlene Quan C. [name], MD [facility] Office: [phone] Pager: [phone]  01/23/2017 9:37 AM

## 2017-01-23 NOTE — Anesthesia Postprocedure Evaluation (Signed)
Anesthesia Post Note  Patient: KACY CONELY  Procedure(s) Performed: Procedure(s) (LRB): LUMBAR FIVE-SACRUM ONE ANTERIOR LUMBAR INTERBODY FUSION WITH ABDOMINAL APPROACH BY DR CAIN (N/A) LUMBAR THREE-FOUR, LUMBAR FOUR-FIVE ANTERIOR LATERAL INTERBODY FUSION (N/A) LUMBAR THREE-SACRAL ONE PERCUTANEOUS PEDICLE SCREW FIXATION WITH ROBOTIC ASSISTANCE (N/A) APPLICATION OF ROBOTIC ASSISTANCE FOR SPINAL PROCEDURE (N/A) ABDOMINAL EXPOSURE (N/A)  Patient location during evaluation: PACU Anesthesia Type: General Level of consciousness: awake and alert Pain management: pain level controlled Vital Signs Assessment: post-procedure vital signs reviewed and stable Respiratory status: spontaneous breathing, nonlabored ventilation, respiratory function stable and patient connected to nasal cannula oxygen Cardiovascular status: blood pressure returned to baseline and stable Postop Assessment: no signs of nausea or vomiting Anesthetic complications: no       Last Vitals:  Vitals:   01/23/17 0059 01/23/17 0600  BP: (!) 121/50 (!) 131/53  Pulse: 65 80  Resp: 18 18  Temp: 36.6 C 36.6 C    Last Pain:  Vitals:   01/23/17 0620  TempSrc:   PainSc: 4                  [name]

## 2017-01-23 NOTE — Progress Notes (Signed)
Pt was scanned for the 2nd time after not urinateting for 6 hr post foley removed. Bladder scan volume was 250 ml. RN IN & OUT cath and 300 was returned from the cathter. Pt tolerated well. Will continue to monitor.

## 2017-01-23 NOTE — Evaluation (Signed)
Physical Therapy Evaluation Patient Details Name: Joseph Hebert MRN: [id number] DOB: 07/04/1946 Today's Date: 01/23/2017   History of Present Illness  71 yo male s/p L 3-4 L4-5 ALIF L 3-s1 pedicule screw fixation with robotic assistance   Clinical Impression  Patient presents with decreased mobility secondary to low BP, pain, L LE weakness and decreased activity tolerance.  Currently mod A for sitting on EOB.  BP still 82/53 after 1 L bolus and pt not tolerating well with symptoms of nausea and light headedness.  Feel he will benefit from skilled PT in the acute setting to allow return home with wife assist.  May need STSNF stay prior to d/c home depending on progress.     Follow Up Recommendations SNF    Equipment Recommendations  None recommended by PT    Recommendations for Other Services       Precautions / Restrictions Precautions Precautions: Fall;Back Required Braces or Orthoses: Spinal Brace Spinal Brace: Lumbar corset;Applied in sitting position      Mobility  Bed Mobility Overal bed mobility: Needs Assistance Bed Mobility: Sidelying to Sit;Sit to Sidelying   Sidelying to sit: Mod assist     Sit to sidelying: Mod assist;+2 for safety/equipment General bed mobility comments: assist for lifting trunk to upright and scooting hips to EOB; assist for legs into bed and guiding trunk to sidelying, assist +2 to scoot to [facility]  Transfers                 General transfer comment: NT pt reports unable at this time due to weakness from decr BP  Ambulation/Gait                Stairs            Wheelchair Mobility    Modified Rankin (Stroke Patients Only)       Balance Overall balance assessment: Needs assistance Sitting-balance support: Bilateral upper extremity supported;Feet supported Sitting balance-Leahy Scale: Poor Sitting balance - Comments: assist for balance at EOB                                     Pertinent  Vitals/Pain Pain Assessment: Faces Faces Pain Scale: Hurts little more Pain Location: L groin (reports they said they cut the muscle) Pain Descriptors / Indicators: Sore Pain Intervention(s): Monitored during session;Repositioned    Home Living Family/patient expects to be discharged to:: Private residence Living Arrangements: Spouse/significant other Available Help at Discharge: Family;Available 24 hours/day Type of Home: House Home Access: Stairs to enter Entrance Stairs-Rails: None Entrance Stairs-Number of Steps: 2 Home Layout: Two level;Laundry or work area in basement;Able to live on main level with bedroom/bathroom Home Equipment: Environmental consultant - 2 wheels;Cane - single point;Bedside commode;Hand held shower head      Prior Function Level of Independence: Independent         Comments: driving      Hand Dominance   Dominant Hand: Right    Extremity/Trunk Assessment        Lower Extremity Assessment Lower Extremity Assessment: Generalized weakness LLE Deficits / Details: limted by pain L hip flexion       Communication   Communication: No difficulties  Cognition Arousal/Alertness: Awake/alert Behavior During Therapy: WFL for tasks assessed/performed Overall Cognitive Status: Within Functional Limits for tasks assessed  General Comments General comments (skin integrity, edema, etc.): supine BP 88/49 HR 69; sitting BP 82/53    Exercises     Assessment/Plan    PT Assessment Patient needs continued PT services  PT Problem List Decreased activity tolerance;Decreased knowledge of use of DME;Decreased knowledge of precautions;Decreased strength;Decreased mobility;Decreased balance          PT Treatment Interventions DME instruction;Gait training;Stair training;Functional mobility training;Balance training;Therapeutic exercise;Patient/family education;Therapeutic activities    PT Goals (Current goals can be found in the Care Plan  section)  Acute Rehab PT Goals Patient Stated Goal: ready to go home PT Goal Formulation: With patient Time For Goal Achievement: 01/30/17 Potential to Achieve Goals: Good    Frequency Min 5X/week   Barriers to discharge        Co-evaluation               End of Session   Activity Tolerance: Treatment limited secondary to medical complications (Comment) (orthotstatic and low BP) Patient left: in bed;with call bell/phone within reach;with bed alarm set           Time: 9476-5465 PT Time Calculation (min) (ACUTE ONLY): 19 min   Charges:   PT Evaluation $PT Eval Moderate Complexity: 1 Procedure     PT G CodesReginia Naas 2017/02/18, 11:33 AM  Magda Kiel, [place] 18-Feb-2017

## 2017-01-23 NOTE — Progress Notes (Signed)
During rounding, pt refused pain med and repositioning.

## 2017-01-23 NOTE — Progress Notes (Signed)
[name] bladder earlier scanned at 2135, only about 83ml of urine was gotten, re-scanned at 2330, still read about 80 ml, pt's abdomen also observed to be distended with hypoactive bowel sounds, Dr Ronnald Ramp (on call) paged and notified, okayed to re-insert the foley catheter for output monitoring, same done at 2345, pt reassured, will however continue to monitor. Obasogie-Asidi, Hever Castilleja Efe

## 2017-01-23 NOTE — Progress Notes (Signed)
Pt seen and examined. No issues overnight.  Expected level of pain.  EXAM: Temp:  [97.1 F (36.2 C)-97.9 F (36.6 C)] 97.9 F (36.6 C) (02/07 0600) Pulse Rate:  [61-80] 80 (02/07 0600) Resp:  [10-21] 18 (02/07 0600) BP: (101-131)/(50-66) 131/53 (02/07 0600) SpO2:  [89 %-100 %] 93 % (02/07 0600) Arterial Line BP: (143-152)/(50-69) 150/50 (02/06 1755) FiO2 (%):  [1 %] 1 % (02/06 1843) Intake/Output      02/06 0701 - 02/07 0700 02/07 0701 - 02/08 0700   I.V. (mL/kg) 4000 (39.4)    IV Piggyback 250    Total Intake(mL/kg) 4250 (41.8)    Urine (mL/kg/hr) 1115 (0.5)    Blood 600 (0.2)    Total Output 1715     Net +2535           Awake and alert Follows commands throughout Full strength except for pain limitation of left iliopsoas.    Stable 1L NS bolus Encouraged ambulation

## 2017-01-23 NOTE — Progress Notes (Signed)
D/C foley. Attempted to ambulate pt, but pt has a difficult time standing up. Pt states don't have good control of left leg and feeling weak. Pt back in bed.

## 2017-01-24 LAB — BASIC METABOLIC PANEL
Anion gap: 11 (ref 5–15)
BUN: 62 mg/dL — ABNORMAL HIGH (ref 6–20)
CO2: 22 mmol/L (ref 22–32)
Calcium: 7.4 mg/dL — ABNORMAL LOW (ref 8.9–10.3)
Chloride: 103 mmol/L (ref 101–111)
Creatinine, Ser: 4.33 mg/dL — ABNORMAL HIGH (ref 0.61–1.24)
GFR calc Af Amer: 15 mL/min — ABNORMAL LOW (ref >60)
GFR calc non Af Amer: 13 mL/min — ABNORMAL LOW (ref >60)
Glucose, Bld: 138 mg/dL — ABNORMAL HIGH (ref 65–99)
Potassium: 4.3 mmol/L (ref 3.5–5.1)
Sodium: 136 mmol/L (ref 135–145)

## 2017-01-24 LAB — CBC
HCT: 25.1 % — ABNORMAL LOW (ref 39.0–52.0)
HEMATOCRIT: 28.1 % — AB (ref 39.0–52.0)
HEMOGLOBIN: 8.9 g/dL — AB (ref 13.0–17.0)
Hemoglobin: 7.8 g/dL — ABNORMAL LOW (ref 13.0–17.0)
MCH: 28.7 pg (ref 26.0–34.0)
MCH: 29.1 pg (ref 26.0–34.0)
MCHC: 31.1 g/dL (ref 30.0–36.0)
MCHC: 31.7 g/dL (ref 30.0–36.0)
MCV: 92.3 fL (ref 78.0–100.0)
MCV: 91.8 fL (ref 78.0–100.0)
Platelets: 271 10*3/uL (ref 150–400)
Platelets: 260 10*3/uL (ref 150–400)
RBC: 2.72 MIL/uL — ABNORMAL LOW (ref 4.22–5.81)
RBC: 3.06 MIL/uL — AB (ref 4.22–5.81)
RDW: 15.4 % (ref 11.5–15.5)
RDW: 15.4 % (ref 11.5–15.5)
WBC: 11.4 10*3/uL — ABNORMAL HIGH (ref 4.0–10.5)
WBC: 12.5 10*3/uL — AB (ref 4.0–10.5)

## 2017-01-24 LAB — GLUCOSE, CAPILLARY
GLUCOSE-CAPILLARY: 118 mg/dL — AB (ref 65–99)
GLUCOSE-CAPILLARY: 122 mg/dL — AB (ref 65–99)
GLUCOSE-CAPILLARY: 143 mg/dL — AB (ref 65–99)
GLUCOSE-CAPILLARY: 110 mg/dL — AB (ref 65–99)

## 2017-01-24 LAB — PREPARE RBC (CROSSMATCH)

## 2017-01-24 MED ORDER — TAMSULOSIN HCL 0.4 MG PO CAPS: 0.4000 mg | ORAL_CAPSULE | Freq: Every day | ORAL | Status: DC

## 2017-01-24 MED ORDER — SODIUM CHLORIDE 0.9 % IV SOLN: Freq: Once | INTRAVENOUS | Status: AC

## 2017-01-24 NOTE — Progress Notes (Addendum)
Issues with urinary retention last night. Catheter was placed.  Pain is as expected. He does not voice any concerns.   EXAM: Temp:  [97.5 F (36.4 C)-97.9 F (36.6 C)] 97.9 F (36.6 C) (02/08 0651) Pulse Rate:  [65-83] 65 (02/08 0651) Resp:  [16-18] 16 (02/08 0651) BP: (82-128)/(44-70) 113/44 (02/08 0651) SpO2:  [90 %-93 %] 91 % (02/08 0651) Intake/Output      02/07 0701 - 02/08 0700 02/08 0701 - 02/09 0700   P.O. 600    I.V. (mL/kg) 2376.3 (23.4)    Other 1000    IV Piggyback     Total Intake(mL/kg) 3976.3 (39.1)    Urine (mL/kg/hr) 650 (0.3)    Blood     Total Output 650     Net +3326.3           Awake and alert Follows commands throughout Full strength with exception of pain limitation of left    Stable Start on Flomax 0.4mg  daily. Monitor BP and report hypotension. Obtain CBC and BMP today Will attempt to remove catheter tomorrow

## 2017-01-24 NOTE — Progress Notes (Signed)
PT Cancellation Note  Patient Details Name: ALUCARD FEARNOW MRN: [id number] DOB: 1946-01-14   Cancelled Treatment:    Reason Eval/Treat Not Completed: Medical issues which prohibited therapy (Rn deferral this am waiting for call from MD in regards to low lab values.  Will await input from MD before mobilizing patient this am.  )   Fahed Morten Eli Hose 01/24/2017, 10:30 AM Governor Rooks, PTA pager [phone]

## 2017-01-24 NOTE — Progress Notes (Signed)
No acute issues Moving legs well Creatinine up; appreciate Nephrology assistance Therapy

## 2017-01-24 NOTE — Progress Notes (Signed)
RN called MD office to inform about abnormal lab values of BUN 62, Creatinine 4.33, Hemoglobin 7.8. MD order 1 Unit of blood. RN administered 1 unit of blood, Pt is tolerating well. Wife is at the bedside.

## 2017-01-24 NOTE — Consult Note (Addendum)
[facility] Consultation Note Requesting Physician:  Neurosurgery Primary Nephrologist: Dr. Clair Gulling Deterding Reason for Consult:  AKI on CKD 3/4  HPI: The patient is a 71 y.o. year-old with PMH significant for DM2, HTN (on ACE), CAD/PAD (prior LLE bypass and bilateral common iliac stents - Dr. [name]), COPD, GERD, gout, obesity, Alzheimer's, baseline CKD 3/4 (followed by Dr. Jimmy Footman at [facility] - creatinine 10/2016 2.23), ? bilateral RAS (abnormal renal artery duplex 06/2016).  Admitted for anterior lumbar fusion surgery. Required vascular surgery exposure for the anterior L5S1 procedure and is s/p L 3-4 L4-5 ALIF L 3-s1 pedicule screw fixation with robotic assistance on 2/6.   Long surgery. Hypotension noted 2/7 received fluid bolus (BP into 70's). Received 3 doses of celebrex post op and wife says was also given this pre-op. Was continued on lisinopril and lasix post op as well as IVF at 75/hour.  We are asked to see because of reduced UOP, AKI on CKD with creatinine increase from 3.08 1 day post op to 4.33 today (pre-op creatinine was done 1/31 and was 2.63; Nov 2017 was 2.23).  Currently somewhat confused, wife in the room, says more so than baseline, she thinks related to pain meds. He says "can't get a good deep breath", but looks comfortable on nasal cannula. Has foley, dark yellow urine draining. Incisions painful.   Creatinine, Ser  Date/Time Value Ref Range Status  01/24/2017 09:01 AM 4.33 (H) 0.61 - 1.24 mg/dL Final  01/23/2017 03:38 AM 3.08 (H) 0.61 - 1.24 mg/dL Final  01/16/2017 02:45 PM 2.63 (H) 0.61 - 1.24 mg/dL Final  11/21/2016 11:50 AM 2.38 (H) 0.61 - 1.24 mg/dL Final  02/18/2016 01:16 AM 1.85 (H) 0.61 - 1.24 mg/dL Final  02/10/2016 11:31 AM 1.91 (H) 0.61 - 1.24 mg/dL Final  08/26/2015 04:55 AM 1.78 (H) 0.61 - 1.24 mg/dL Final  08/25/2015 09:10 AM 2.32 (H) 0.61 - 1.24 mg/dL Final  08/24/2015 12:43 AM 2.26 (H) 0.61 - 1.24 mg/dL Final  08/17/2015 11:32 AM 1.90 (H) 0.61 - 1.24 mg/dL  Final  06/24/2015 08:12 AM 1.80 (H) 0.61 - 1.24 mg/dL Final  11/01/2010 05:00 AM 1.86 (H) 0.4 - 1.5 mg/dL Final  10/26/2010 03:55 PM 1.74 (H) 0.4 - 1.5 mg/dL Final     Past Medical History:  Diagnosis Date  . Anemia   . Arrhythmia    takes metoprolol daily  . Arthritis   . Asthma   . CAD (coronary artery disease)   . Carotid artery occlusion    with Claudication  . Chronic back pain    stenosis  . Chronic kidney disease   . COPD (chronic obstructive pulmonary disease) (HCC)    Spirva daily and Albuterol as needed  . Dementia   . Depression   . Diabetes mellitus without complication (HCC)    Type 2 diet controlled.Never been on meds  . Gout    takes Uloric daily  . Head injury    as a child  . History of colon polyps    benign  . History of gastric ulcer   . History of hiatal hernia   . Hyperlipidemia    takes Atorvastatin and Fenofibrate daily  . Hypertension    takes Lisinopril,Amlodipine,and Hydralazine daily  . Impaired memory    takes Namenda daily  . Numbness    lower left leg and upper right thigh  . Pneumonia    hx of-couple of yrs ago  . Shortness of breath dyspnea    with exertion  . Tuberculosis  2006    9 months   . Vertigo     Past Surgical History:  Procedure Laterality Date  . ABDOMINAL EXPOSURE N/A 01/22/2017   Procedure: ABDOMINAL EXPOSURE;  Surgeon: [name], MD;  Location: [place];  Service: Vascular;  Laterality: N/A;  . ANTERIOR LAT LUMBAR FUSION N/A 01/22/2017   Procedure: LUMBAR THREE-FOUR, LUMBAR FOUR-FIVE ANTERIOR LATERAL INTERBODY FUSION;  Surgeon: [name], MD;  Location: [place];  Service: Neurosurgery;  Laterality: N/A;  L3-4 L4-5 Lateral interbody fusion  . ANTERIOR LUMBAR FUSION N/A 01/22/2017   Procedure: LUMBAR FIVE-SACRUM ONE ANTERIOR LUMBAR INTERBODY FUSION WITH ABDOMINAL APPROACH BY DR [name];  Surgeon: [name], MD;  Location: [place];  Service: Neurosurgery;  Laterality: N/A;  . CAROTID BODY TUMOR  EXCISION Left 09/21/2015   Procedure: EXCISE LEFT NECK NODULE WITH LOCAL ;  Surgeon: [name], MD;  Location: [place];  Service: Vascular;  Laterality: Left;  . CAROTID ENDARTERECTOMY  Nov. 15,2011   LEFT cea  . cataract surgery Bilateral   . COLONOSCOPY    . EYE SURGERY    . FEMORAL-POPLITEAL BYPASS GRAFT Left 08/23/2015   Procedure: LEFT FEMORAL-POPLITEAL ARTERY BYPASS WITH GORETEX GRAFT;  Surgeon: [name], MD;  Location: [place];  Service: Vascular;  Laterality: Left;  . JOINT REPLACEMENT  1980   RIGHT  knee  . LUMBAR EPIDURAL INJECTION    . LUMBAR LAMINECTOMY/DECOMPRESSION MICRODISCECTOMY Right 02/17/2016   Procedure: Laminectomy and Foraminotomy - Lumbar four-five right;  Surgeon: [name], MD;  Location: [facility];  Service: Neurosurgery;  Laterality: Right;  right  . LUMBAR PERCUTANEOUS PEDICLE SCREW 3 LEVEL N/A 01/22/2017   Procedure: LUMBAR THREE-SACRAL ONE PERCUTANEOUS PEDICLE SCREW FIXATION WITH ROBOTIC ASSISTANCE;  Surgeon: [name], MD;  Location: [place];  Service: Neurosurgery;  Laterality: N/A;  L3 to S1 Percutaneous pedicle screw fixation  . PERIPHERAL VASCULAR CATHETERIZATION N/A 06/24/2015   Procedure: Abdominal Aortogram;  Surgeon: [name], MD;  Location: [facility];  Service: Cardiovascular;  Laterality: N/A;  . Stents  Aug.  23, 1999   Bilateral iliofemoral  stents, [place].  . TONSILLECTOMY      Family History  Problem Relation Age of Onset  . Diabetes Mother   . Hyperlipidemia Father   . Hypertension Father   . Other Father     Right Leg Amputation  . Heart disease Sister     Aneyrism    Social History:  reports that he quit smoking about 6 months ago. His smoking use included Cigarettes. He smoked 0.30 packs per day. He has never used smokeless tobacco. He reports that he does not drink alcohol or use drugs. Allergies  Allergen Reactions  . No Known Allergies     Home medications: Prior to Admission  medications   Medication Sig Start Date End Date Taking? Authorizing Provider  amLODipine (NORVASC) 10 MG tablet Take 10 mg by mouth daily.   Yes Historical Provider, MD  aspirin EC 81 MG tablet Take 81 mg by mouth daily.   Yes Historical Provider, MD  atorvastatin (LIPITOR) 80 MG tablet Take 80 mg by mouth at bedtime.    Yes Historical Provider, MD  fenofibrate (TRICOR) 145 MG tablet Take 145 mg by mouth at bedtime.    Yes Historical Provider, MD  furosemide (LASIX) 40 MG tablet Take 40 mg by mouth daily.   Yes Historical Provider, MD  hydrALAZINE (APRESOLINE) 25 MG tablet Take 25 mg  by mouth 2 (two) times daily. 05/17/15  Yes Historical Provider, MD  lisinopril (PRINIVIL,ZESTRIL) 40 MG tablet Take 40 mg by mouth daily.   Yes Historical Provider, MD  memantine (NAMENDA XR) 28 MG CP24 24 hr capsule Take 28 mg by mouth daily.   Yes Historical Provider, MD  metoprolol succinate (TOPROL-XL) 50 MG 24 hr tablet Take 50 mg by mouth daily. Take with or immediately following a meal.   Yes Historical Provider, MD  ULORIC 80 MG TABS Take 80 mg by mouth daily. 11/12/16  Yes Historical Provider, MD    Inpatient medications: . sodium chloride   Intravenous Once  . acetaminophen  1,000 mg Oral Q6H  . amLODipine  10 mg Oral Daily  . aspirin EC  81 mg Oral Daily  . atorvastatin  80 mg Oral QHS  . celecoxib  200 mg Oral BID  . docusate sodium  100 mg Oral BID  . febuxostat  80 mg Oral Daily  . fenofibrate  160 mg Oral Daily  . furosemide  40 mg Oral Daily  . gabapentin  300 mg Oral TID  . hydrALAZINE  25 mg Oral BID  . insulin aspart  0-9 Units Subcutaneous TID WC  . lisinopril  40 mg Oral Daily  . memantine  28 mg Oral Daily  . methocarbamol  750 mg Oral QID  . metoprolol succinate  50 mg Oral Daily  . oxyCODONE  20 mg Oral Q12H  . senna  1 tablet Oral BID  . sodium chloride flush  3 mL Intravenous Q12H  . tamsulosin  0.4 mg Oral QPC supper   . sodium chloride 75 mL/hr (01/24/17 1322)  . sodium  chloride      Review of Systems See HPI  Physical Exam:  Blood pressure (!) 126/45, pulse 68, temperature 98 F (36.7 C), temperature source Oral, resp. rate 16, height 5' 9.5" (1.765 m), weight 101.6 kg (224 lb), SpO2 92 %.  Gen: MO pale appearing WM. Blood infusing. Mildly confused but pleasant. Wife with him Skin: no rash, cyanosis Neck: Large and beefy, but no discernible JVD at this time. Scars from prior carotid surgeries. Bilateral carotid bruits. Chest: Anteriorly clear but breath sounds reduced. No definite crackles. Heart: Cardiac rhythm regular. S1S2 No S3 or S4. Soft 1/6 systolic murmur LSB No diastolic murmur or rub. Abd quite protuberant. Vertical incision left of midline and below umbilicus clean and dry. + BS. Appropriate incisional tenderness. Ext: SCD's in place. Trace-1+ pitting edema bilaterally. Neuro: Awake, sleepy, mild confusion. Oriented to person, place and situation.  Neuro: alert, Ox3, no focal deficit Heme/Lymph: no bruising or LAN   Labs:  Recent Labs Lab 01/22/17 1334 01/22/17 1547 01/23/17 0338 01/24/17 0901  NA 141 140 138 136  K 4.8 5.1 4.5 4.3  CL  --   --  103 103  CO2  --   --  23 22  GLUCOSE  --   --  113* 138*  BUN  --   --  38* 62*  CREATININE  --   --  3.08* 4.33*  CALCIUM  --   --  8.0* 7.4*     Recent Labs Lab 01/22/17 1334 01/22/17 1547 01/23/17 0338 01/24/17 0901  WBC  --   --  8.7 11.4*  HGB 10.2* 10.2* 9.3* 7.8*  HCT 30.0* 30.0* 29.4* 25.1*  MCV  --   --  92.2 92.3  PLT  --   --  259 271    Recent  Labs Lab 01/23/17 1145 01/23/17 1632 01/23/17 2143 01/24/17 0651 01/24/17 1153  GLUCAP 154* 122* 138* 118* 122*    Xrays/Other Studies: Dg Lumbar Spine 2-3 Views  Result Date: 01/22/2017 CLINICAL DATA:  Lumbar fusion EXAM: LUMBAR SPINE - 2-3 VIEW; DG C-ARM GT 120 MIN COMPARISON:  01/15/2017 FLUOROSCOPY TIME:  Fluoroscopy Time:  3 minutes 14 seconds Radiation Exposure Index (if provided by the fluoroscopic  device): Un known Number of Acquired Spot Images: 2 FINDINGS: Pedicle screws are now seen at L3, L4, L5 and S1 with interbody fusion at all 3 levels. Posterior fixation is noted. Changes of prior iliac stents are seen. IMPRESSION: Lumbar fusion from L3-S1. Electronically Signed   By: Inez Catalina M.D.   On: 01/22/2017 15:34   Dg C-arm Gt 120 Min  Result Date: 01/22/2017 CLINICAL DATA:  Lumbar fusion EXAM: LUMBAR SPINE - 2-3 VIEW; DG C-ARM GT 120 MIN COMPARISON:  01/15/2017 FLUOROSCOPY TIME:  Fluoroscopy Time:  3 minutes 14 seconds Radiation Exposure Index (if provided by the fluoroscopic device): Un known Number of Acquired Spot Images: 2 FINDINGS: Pedicle screws are now seen at L3, L4, L5 and S1 with interbody fusion at all 3 levels. Posterior fixation is noted. Changes of prior iliac stents are seen. IMPRESSION: Lumbar fusion from L3-S1. Electronically Signed   By: Inez Catalina M.D.   On: 01/22/2017 15:34    Background: 71 y.o. year-old with PMH significant for DM2, HTN (on ACE), CAD/PAD (prior LLE bypass and bilateral common iliac stents - Dr. [name]), COPD, GERD, gout, baseline CKD 3/4 (followed by Dr. Jimmy Footman at [facility]), ? Bilateral RAS (abnormal renal artery duplex 06/2016).  Admitted for anterior lumbar fusion surgery. Required vascular surgery exposure for the anterior L5S1 procedure and is s/p L 3-4 L4-5 ALIF L 3-s1 pedicule screw fixation with robotic assistance on 2/6. Long surgery. Hypotension noted 2/7 and received fluid bolus (BP into 70's). Received 3 doses of celebrex post op, continued on lisinopril and lasix post op.  We are asked to see because of reduced UOP, AKI on CKD with creatinine increase from 3.08 1 day post op to 4.33 today (pre-op creatinine was done 1/31 and was 2.63)  Assessment/Recommendations  1. AKI on CKD3/4 - ischemic AKI related to hypotension, ABLA, with ACE and NSAID on board. Making some urine. Recommend stopping lasix (can use lasix prn until turns  around), lisinopril, and no more NSAID. Stop NS. Hopefully will turn around with these changes, but could require dialysis if continued rapid worsening. So far K and acid base look OK. Not overtly volume overloaded. No dialysis indications at this time. Trend labs, monitor UOP. Has foley in. Stop tamsulosin for now.  2. Anemia - ABLA + anemia of CKD. Getting PRBC's now for Hb 7.8. Has h/o Fe def in the outpt setting. Check Fe studies, replete if indicated 3. HTN - Hold ACE in face of AKI.  4. DM - per primary service 5. S/p LS fusion procedure, pedicle screw fixation, anterior approach with abdominal exposure  - per neurosurgery (Dr. Cyndy Freeze) and vascular surgery (Dr. [name]) teams.  Avoid further NSAID use. 6. PAD/CAD/? RAS - I need to clarify with Dr. Jimmy Footman if any intervention or further eval was entertained for abnormal RA doppler results from 06/2016.  7. Gout - OK to continue Elizabeth,  MD [facility] [phone] pager 01/24/2017, 1:29 PM

## 2017-01-25 ENCOUNTER — Inpatient Hospital Stay (HOSPITAL_COMMUNITY): Payer: Medicare Other

## 2017-01-25 DIAGNOSIS — G9340 Encephalopathy, unspecified: Secondary | ICD-10-CM

## 2017-01-25 DIAGNOSIS — I959 Hypotension, unspecified: Secondary | ICD-10-CM

## 2017-01-25 DIAGNOSIS — J9811 Atelectasis: Secondary | ICD-10-CM

## 2017-01-25 DIAGNOSIS — D649 Anemia, unspecified: Secondary | ICD-10-CM

## 2017-01-25 LAB — BLOOD GAS, ARTERIAL
Acid-base deficit: 5.9 mmol/L — ABNORMAL HIGH (ref 0.0–2.0)
Bicarbonate: 19.2 mmol/L — ABNORMAL LOW (ref 20.0–28.0)
FIO2: 50
O2 Saturation: 90.9 %
PATIENT TEMPERATURE: 98.2
PH ART: 7.314 — AB (ref 7.350–7.450)
pCO2 arterial: 38.8 mmHg (ref 32.0–48.0)
pO2, Arterial: 64.3 mmHg — ABNORMAL LOW (ref 83.0–108.0)

## 2017-01-25 LAB — RENAL FUNCTION PANEL
ALBUMIN: 2.4 g/dL — AB (ref 3.5–5.0)
ANION GAP: 11 (ref 5–15)
BUN: 68 mg/dL — ABNORMAL HIGH (ref 6–20)
CO2: 19 mmol/L — ABNORMAL LOW (ref 22–32)
Calcium: 7.3 mg/dL — ABNORMAL LOW (ref 8.9–10.3)
Chloride: 104 mmol/L (ref 101–111)
Creatinine, Ser: 4.64 mg/dL — ABNORMAL HIGH (ref 0.61–1.24)
GFR, EST AFRICAN AMERICAN: 13 mL/min — AB (ref >60)
GFR, EST NON AFRICAN AMERICAN: 12 mL/min — AB (ref >60)
Glucose, Bld: 104 mg/dL — ABNORMAL HIGH (ref 65–99)
Phosphorus: 4.4 mg/dL (ref 2.5–4.6)
Potassium: 4.4 mmol/L (ref 3.5–5.1)
Sodium: 134 mmol/L — ABNORMAL LOW (ref 135–145)

## 2017-01-25 LAB — EXPECTORATED SPUTUM ASSESSMENT W REFEX TO RESP CULTURE: SPECIAL REQUESTS: NORMAL

## 2017-01-25 LAB — CBC
HCT: 26.2 % — ABNORMAL LOW (ref 39.0–52.0)
Hemoglobin: 8.3 g/dL — ABNORMAL LOW (ref 13.0–17.0)
MCH: 28.9 pg (ref 26.0–34.0)
MCHC: 31.7 g/dL (ref 30.0–36.0)
MCV: 91.3 fL (ref 78.0–100.0)
Platelets: 250 10*3/uL (ref 150–400)
RBC: 2.87 MIL/uL — ABNORMAL LOW (ref 4.22–5.81)
RDW: 15.4 % (ref 11.5–15.5)
WBC: 10.7 10*3/uL — ABNORMAL HIGH (ref 4.0–10.5)

## 2017-01-25 LAB — GLUCOSE, CAPILLARY
GLUCOSE-CAPILLARY: 105 mg/dL — AB (ref 65–99)
GLUCOSE-CAPILLARY: 105 mg/dL — AB (ref 65–99)
GLUCOSE-CAPILLARY: 116 mg/dL — AB (ref 65–99)
Glucose-Capillary: 150 mg/dL — ABNORMAL HIGH (ref 65–99)

## 2017-01-25 LAB — IRON AND TIBC
IRON: 23 ug/dL — AB (ref 45–182)
SATURATION RATIOS: 10 % — AB (ref 17.9–39.5)
TIBC: 231 ug/dL — AB (ref 250–450)
UIBC: 208 ug/dL

## 2017-01-25 LAB — STREP PNEUMONIAE URINARY ANTIGEN: Strep Pneumo Urinary Antigen: NEGATIVE

## 2017-01-25 LAB — EXPECTORATED SPUTUM ASSESSMENT W GRAM STAIN, RFLX TO RESP C

## 2017-01-25 MED ORDER — OXYCODONE HCL 5 MG PO TABS: 5.0000 mg | ORAL_TABLET | ORAL | Status: DC | PRN

## 2017-01-25 MED ORDER — VANCOMYCIN HCL 10 G IV SOLR
2000.0000 mg | Freq: Once | INTRAVENOUS | Status: AC
  Administered 2017-01-25: 2000 mg via INTRAVENOUS
  Filled 2017-01-25: qty 2000

## 2017-01-25 MED ORDER — FUROSEMIDE 10 MG/ML IJ SOLN
80.0000 mg | Freq: Once | INTRAMUSCULAR | Status: AC
  Administered 2017-01-25: 80 mg via INTRAVENOUS
  Filled 2017-01-25: qty 8

## 2017-01-25 MED ORDER — METOPROLOL SUCCINATE ER 50 MG PO TB24: 50.0000 mg | ORAL_TABLET | Freq: Every day | ORAL | Status: DC

## 2017-01-25 MED ORDER — WHITE PETROLATUM GEL
Status: AC
  Administered 2017-01-25: 11:00:00
  Filled 2017-01-25: qty 1

## 2017-01-25 MED ORDER — ORAL CARE MOUTH RINSE
15.0000 mL | Freq: Two times a day (BID) | OROMUCOSAL | Status: DC
  Administered 2017-01-25 – 2017-01-27 (×5): 15 mL via OROMUCOSAL

## 2017-01-25 MED ORDER — GABAPENTIN 300 MG PO CAPS
300.0000 mg | ORAL_CAPSULE | Freq: Three times a day (TID) | ORAL | Status: DC
  Administered 2017-01-25: 300 mg via ORAL
  Filled 2017-01-25: qty 1

## 2017-01-25 MED ORDER — HYDRALAZINE HCL 25 MG PO TABS: 25.0000 mg | ORAL_TABLET | Freq: Two times a day (BID) | ORAL | Status: DC

## 2017-01-25 MED ORDER — PIPERACILLIN-TAZOBACTAM IN DEX 2-0.25 GM/50ML IV SOLN
2.2500 g | Freq: Three times a day (TID) | INTRAVENOUS | Status: DC
  Administered 2017-01-25 – 2017-01-28 (×10): 2.25 g via INTRAVENOUS
  Filled 2017-01-25 (×11): qty 50

## 2017-01-25 NOTE — Progress Notes (Signed)
Patient ID: Joseph Hebert, male   DOB: [birth year], 71 y.o.   MRN: [id number]  BP (!) 113/43 (BP Location: Left Arm)   Pulse 67   Temp 99.6 F (37.6 C) (Oral)   Resp 17   Ht 5' 9.5" (1.765 m)   Wt 101.6 kg (224 lb)   SpO2 92%   BMI 32.60 kg/m  Lethargic, confused. Will follow commands. Called overnight secondary to hypotension, low Hct. Wound is clean and dry, both abdominal and lumbar incisions Abdomen is tense, tympanitic Breathing is shallow, but not labored. Moving all extremities Has had low BP since OR but constellation of findings, and need for intense medical care guides decision to move him to the ICU at this time.  Results for orders placed or performed during the hospital encounter of 01/22/17 (from the past 48 hour(s))  Glucose, capillary     Status: Abnormal   Collection Time: 01/23/17 11:45 AM  Result Value Ref Range   Glucose-Capillary 154 (H) 65 - 99 mg/dL  Glucose, capillary     Status: Abnormal   Collection Time: 01/23/17  4:32 PM  Result Value Ref Range   Glucose-Capillary 122 (H) 65 - 99 mg/dL  Glucose, capillary     Status: Abnormal   Collection Time: 01/23/17  9:43 PM  Result Value Ref Range   Glucose-Capillary 138 (H) 65 - 99 mg/dL   Comment 1 Notify RN    Comment 2 Document in Chart   Glucose, capillary     Status: Abnormal   Collection Time: 01/24/17  6:51 AM  Result Value Ref Range   Glucose-Capillary 118 (H) 65 - 99 mg/dL   Comment 1 Notify RN    Comment 2 Document in Chart   CBC     Status: Abnormal   Collection Time: 01/24/17  9:01 AM  Result Value Ref Range   WBC 11.4 (H) 4.0 - 10.5 K/uL   RBC 2.72 (L) 4.22 - 5.81 MIL/uL   Hemoglobin 7.8 (L) 13.0 - 17.0 g/dL   HCT 25.1 (L) 39.0 - 52.0 %   MCV 92.3 78.0 - 100.0 fL   MCH 28.7 26.0 - 34.0 pg   MCHC 31.1 30.0 - 36.0 g/dL   RDW 15.4 11.5 - 15.5 %   Platelets 271 150 - 400 K/uL  Basic metabolic panel     Status: Abnormal   Collection Time: 01/24/17  9:01 AM  Result Value Ref Range   Sodium  136 135 - 145 mmol/L   Potassium 4.3 3.5 - 5.1 mmol/L   Chloride 103 101 - 111 mmol/L   CO2 22 22 - 32 mmol/L   Glucose, Bld 138 (H) 65 - 99 mg/dL   BUN 62 (H) 6 - 20 mg/dL   Creatinine, Ser 4.33 (H) 0.61 - 1.24 mg/dL   Calcium 7.4 (L) 8.9 - 10.3 mg/dL   GFR calc non Af Amer 13 (L) >60 mL/min   GFR calc Af Amer 15 (L) >60 mL/min    Comment: (NOTE) The eGFR has been calculated using the CKD EPI equation. This calculation has not been validated in all clinical situations. eGFR's persistently <60 mL/min signify possible Chronic Kidney Disease.    Anion gap 11 5 - 15  Prepare RBC     Status: None   Collection Time: 01/24/17 10:57 AM  Result Value Ref Range   Order Confirmation ORDER PROCESSED BY BLOOD BANK   Glucose, capillary     Status: Abnormal   Collection Time: 01/24/17 11:53 AM  Result Value Ref Range   Glucose-Capillary 122 (H) 65 - 99 mg/dL  Glucose, capillary     Status: Abnormal   Collection Time: 01/24/17  5:18 PM  Result Value Ref Range   Glucose-Capillary 143 (H) 65 - 99 mg/dL   Comment 1 Notify RN   CBC     Status: Abnormal   Collection Time: 01/24/17  6:36 PM  Result Value Ref Range   WBC 12.5 (H) 4.0 - 10.5 K/uL    Comment: REPEATED TO VERIFY WHITE COUNT CONFIRMED ON SMEAR    RBC 3.06 (L) 4.22 - 5.81 MIL/uL   Hemoglobin 8.9 (L) 13.0 - 17.0 g/dL   HCT 28.1 (L) 39.0 - 52.0 %   MCV 91.8 78.0 - 100.0 fL   MCH 29.1 26.0 - 34.0 pg   MCHC 31.7 30.0 - 36.0 g/dL   RDW 15.4 11.5 - 15.5 %   Platelets 260 150 - 400 K/uL  Glucose, capillary     Status: Abnormal   Collection Time: 01/24/17  9:22 PM  Result Value Ref Range   Glucose-Capillary 110 (H) 65 - 99 mg/dL   Comment 1 Notify RN    Comment 2 Document in Chart   Renal function panel     Status: Abnormal   Collection Time: 01/25/17  2:24 AM  Result Value Ref Range   Sodium 134 (L) 135 - 145 mmol/L   Potassium 4.4 3.5 - 5.1 mmol/L   Chloride 104 101 - 111 mmol/L   CO2 19 (L) 22 - 32 mmol/L   Glucose, Bld 104  (H) 65 - 99 mg/dL   BUN 68 (H) 6 - 20 mg/dL   Creatinine, Ser 4.64 (H) 0.61 - 1.24 mg/dL   Calcium 7.3 (L) 8.9 - 10.3 mg/dL   Phosphorus 4.4 2.5 - 4.6 mg/dL   Albumin 2.4 (L) 3.5 - 5.0 g/dL   GFR calc non Af Amer 12 (L) >60 mL/min   GFR calc Af Amer 13 (L) >60 mL/min    Comment: (NOTE) The eGFR has been calculated using the CKD EPI equation. This calculation has not been validated in all clinical situations. eGFR's persistently <60 mL/min signify possible Chronic Kidney Disease.    Anion gap 11 5 - 15  Iron and TIBC     Status: Abnormal   Collection Time: 01/25/17  2:24 AM  Result Value Ref Range   Iron 23 (L) 45 - 182 ug/dL   TIBC 231 (L) 250 - 450 ug/dL   Saturation Ratios 10 (L) 17.9 - 39.5 %   UIBC 208 ug/dL  CBC     Status: Abnormal   Collection Time: 01/25/17  2:24 AM  Result Value Ref Range   WBC 10.7 (H) 4.0 - 10.5 K/uL   RBC 2.87 (L) 4.22 - 5.81 MIL/uL   Hemoglobin 8.3 (L) 13.0 - 17.0 g/dL   HCT 26.2 (L) 39.0 - 52.0 %   MCV 91.3 78.0 - 100.0 fL   MCH 28.9 26.0 - 34.0 pg   MCHC 31.7 30.0 - 36.0 g/dL   RDW 15.4 11.5 - 15.5 %   Platelets 250 150 - 400 K/uL  Glucose, capillary     Status: Abnormal   Collection Time: 01/25/17  6:29 AM  Result Value Ref Range   Glucose-Capillary 105 (H) 65 - 99 mg/dL  history of CHF, rising creatnine also.

## 2017-01-25 NOTE — Consult Note (Signed)
PULMONARY / CRITICAL CARE MEDICINE   Name: Joseph Hebert MRN: [id number] DOB: Dec 13, 1946    ADMISSION DATE:  01/22/2017 CONSULTATION DATE:  2/9  REFERRING MD:  NS  CHIEF COMPLAINT:  Hypoxia  HISTORY OF PRESENT ILLNESS:   71 yo male with refractory hypertension, PVD post left fem pop, CAD, Dyspnea on excerption who had anterior abd approach l3-5 fusion. He has a plethora of health issues as they are well documented. Early am 2/9 he was hypotensive and hypoxic and RRT was called to bedside. He was transferred to nsicu and promptly improved with stimulation. PCCM called to evaluate, he was on 4 l [place], sats good, awake and follows commands. Abd was distended , surgical site was well approximated. He is for CT abd am 2/9. We will follow for now. Note cxr reveals possible LLL pna therefore will add abx 2/9.  PAST MEDICAL HISTORY :  He  has a past medical history of Anemia; Arrhythmia; Arthritis; Asthma; CAD (coronary artery disease); Carotid artery occlusion; Chronic back pain; Chronic kidney disease; COPD (chronic obstructive pulmonary disease) ([place]); Dementia; Depression; Diabetes mellitus without complication ([place]); Gout; Head injury; History of colon polyps; History of gastric ulcer; History of hiatal hernia; Hyperlipidemia; Hypertension; Impaired memory; Numbness; Pneumonia; Shortness of breath dyspnea; Tuberculosis (2006); and Vertigo.  PAST SURGICAL HISTORY: He  has a past surgical history that includes Carotid endarterectomy (Nov. 15,2011); Joint replacement (1980); Stents (Aug.  23, 1999); Cardiac catheterization (N/A, 06/24/2015); Femoral-popliteal Bypass Graft (Left, 08/23/2015); Colonoscopy; Carotid body tumor excision (Left, 09/21/2015); Lumbar epidural injection; cataract surgery (Bilateral); Lumbar laminectomy/decompression microdiscectomy (Right, 02/17/2016); Tonsillectomy; Eye surgery; Anterior lumbar fusion (N/A, 01/22/2017); Anterior lat lumbar fusion (N/A, 01/22/2017); Lumbar percutaneous pedicle  screw 3 level (N/A, 01/22/2017); and Abdominal exposure (N/A, 01/22/2017).  Allergies  Allergen Reactions  . No Known Allergies     No current facility-administered medications on file prior to encounter.    Current Outpatient Prescriptions on File Prior to Encounter  Medication Sig  . amLODipine (NORVASC) 10 MG tablet Take 10 mg by mouth daily.  [name] aspirin EC 81 MG tablet Take 81 mg by mouth daily.  [name] atorvastatin (LIPITOR) 80 MG tablet Take 80 mg by mouth at bedtime.   . fenofibrate (TRICOR) 145 MG tablet Take 145 mg by mouth at bedtime.   . furosemide (LASIX) 40 MG tablet Take 40 mg by mouth daily.  . hydrALAZINE (APRESOLINE) 25 MG tablet Take 25 mg by mouth 2 (two) times daily.  [name] lisinopril (PRINIVIL,ZESTRIL) 40 MG tablet Take 40 mg by mouth daily.  . memantine (NAMENDA XR) 28 MG CP24 24 hr capsule Take 28 mg by mouth daily.  . metoprolol succinate (TOPROL-XL) 50 MG 24 hr tablet Take 50 mg by mouth daily. Take with or immediately following a meal.  . ULORIC 80 MG TABS Take 80 mg by mouth daily.    FAMILY HISTORY:  His indicated that his mother is deceased. He indicated that his father is deceased. He indicated that his sister is deceased.    SOCIAL HISTORY: He  reports that he quit smoking about 6 months ago. His smoking use included Cigarettes. He smoked 0.30 packs per day. He has never used smokeless tobacco. He reports that he does not drink alcohol or use drugs.  REVIEW OF SYSTEMS:   10 point review of system taken, please see HPI for positives and negatives.   SUBJECTIVE:  Lethargic wm NAD  VITAL SIGNS: BP (!) 83/42 (BP Location: Right Arm)   Pulse 76  Temp 99.4 F (37.4 C) (Rectal)   Resp 16   Ht 5' 9.5" (1.765 m)   Wt 242 lb 4.6 oz (109.9 kg)   SpO2 90%   BMI 35.27 kg/m   HEMODYNAMICS:    VENTILATOR SETTINGS: FiO2 (%):  [50 %] 50 %  INTAKE / OUTPUT: I/O last 3 completed shifts: In: 1784.3 [P.O.:480; I.V.:931.8; Blood:372.5] Out: 1400  [Urine:1400]  PHYSICAL EXAMINATION: General:  WNWD WM  Neuro:  Follows commands, dull affect, but intact HEENT:  No jvd/lan Cardiovascular:  HSR RRR Lungs:  Decreased bs bases, poor cough Abdomen:  incision site unremarkable, mild distension, + bs Musculoskeletal:  Intact Skin:  Warm and dry  LABS:  BMET  Recent Labs Lab 01/23/17 0338 01/24/17 0901 01/25/17 0224  NA 138 136 134*  K 4.5 4.3 4.4  CL 103 103 104  CO2 23 22 19*  BUN 38* 62* 68*  CREATININE 3.08* 4.33* 4.64*  GLUCOSE 113* 138* 104*    Electrolytes  Recent Labs Lab 01/23/17 0338 01/24/17 0901 01/25/17 0224  CALCIUM 8.0* 7.4* 7.3*  PHOS  --   --  4.4    CBC  Recent Labs Lab 01/24/17 0901 01/24/17 1836 01/25/17 0224  WBC 11.4* 12.5* 10.7*  HGB 7.8* 8.9* 8.3*  HCT 25.1* 28.1* 26.2*  PLT 271 260 250    Coag's  Recent Labs Lab 01/22/17 0610 01/23/17 0338  APTT 33 37*  INR 0.99 1.16    Sepsis Markers No results for input(s): LATICACIDVEN, PROCALCITON, O2SATVEN in the last 168 hours.  ABG  Recent Labs Lab 01/22/17 1334 01/22/17 1547 01/25/17 0624  PHART 7.332* 7.336* 7.314*  PCO2ART 42.3 39.9 38.8  PO2ART 82.0* 232.0* 64.3*    Liver Enzymes  Recent Labs Lab 01/25/17 0224  ALBUMIN 2.4*    Cardiac Enzymes No results for input(s): TROPONINI, PROBNP in the last 168 hours.  Glucose  Recent Labs Lab 01/23/17 2143 01/24/17 0651 01/24/17 1153 01/24/17 1718 01/24/17 2122 01/25/17 0629  GLUCAP 138* 118* 122* 143* 110* 105*    Imaging No results found.   STUDIES:  2/9 ct abd>>  CULTURES: 2/9 bc x 2>> 2/0 uc>> 2/9 sputum>>  ANTIBIOTICS: 2/9 vanc>> 2/9 Zoysyn>>  SIGNIFICANT EVENTS: 2/6 lumbar fusion ant abd approach. 2/9 transferred to ICU with hypotension and hypoxia  LINES/TUBES:   DISCUSSION: 71 yo male with refractory hypertension, PVD post left fem pop, CAD, Dyspnea on excerption who had anterior abd approach l3-5 fusion. He has a plethora of  health issues as they are well documented. Early am 2/9 he was hypotensive and hypoxic and RRT was called to bedside. He was transferred to nsicu and promptly improved with stimulation. PCCM called to evaluate, he was on 4 l [place], sats good, awake and follows commands. Abd was distended , surgical site was well approximated. He is for CT abd am 2/9. We will follow for now. Note cxr reveals possible LLL pna therefore will add abx 2/9.   ASSESSMENT / PLAN:  PULMONARY A: Mild desaturation , better with stimulation ?lll pna P:   CxR Pulmoary toilet DC OxyContin  abx  CARDIOVASCULAR A:  Hypotension in setting of post op lumbar surgery on multiple antihypertensives P:  Hold antihypertensives till satble  RENAL Lab Results  Component Value Date   CREATININE 4.64 (H) 01/25/2017   CREATININE 4.33 (H) 01/24/2017   CREATININE 3.08 (H) 01/23/2017    A:   CKD base 3.0 P:   Check CT ro blockage Minimize nephrotoxins   GASTROINTESTINAL  A:   Abd distension #3 post op P:   CT abd mild rp hematoma  HEMATOLOGIC  Recent Labs  01/24/17 1836 01/25/17 0224  HGB 8.9* 8.3*    A:   Transfused per NS for Hgb 7.8 P:  Follow cbc  INFECTIOUS A:   Elevated WBC,99.4 temp No ABX P:   Check cxr ?LLLpna Pulmonary toilet 2/9 start V/Z 2/9 pan culture  ENDOCRINE A:   DM P:   Monitor labs  NEUROLOGIC A:   Lethargic Rass 0 Post 2/6 ant approach L3-5 fusion Note on OxyContin 20 mg bid P:   RASS goal: 0 Minimize sedation DC OxyContin 2/9 use prn oxycodone    FAMILY  - Updates: No family at bedside  - Inter-disciplinary family meet or Palliative Care meeting due by:  day [place] ACNP Maryanna Shape PCCM Pager [phone] till 3 pm If no answer page [phone] 01/25/2017, 9:27 AM  Attending Note:  70 year old male s/p lumbar surgery who was noted to be confused, hypotensive and anemic.  PCCM was consulted for said problems.  On exam, he is much more alert now and BP  is 102.  I reviewed CXR myself, LLL atelectasis noted.  Discussed with PCCM-NP.  AMS: due to narcotics  - Decrease dose by half  - Change to PRN  Hypotension: likely related to medications (home anti-HTN)  - Holding parameter in place  - Hold home anti-HTN til stable  Anemia: small hematoma noted on CT  - Transfuse  - F/U CBC  Atelectasis  - IS per RT protocol  - Mobilize as able  - PT  PCCM will follow while in the ICU  Patient seen and examined, agree with above note.  I dictated the care and orders written for this patient under my direction.  Rush Farmer, MD [phone]

## 2017-01-25 NOTE — Progress Notes (Signed)
abd ct and chest xray results back.  Retroperitoneal hematoma noted and atelectasis vs pna noted on cxray.  Tomasa Rand PA for nsgy states, "Aware of results.  No surgical intervention needed at this time."  Notified Richardson Landry Minor NP of xray results and new orders for pan cx and pulmonary toiletting noted.

## 2017-01-25 NOTE — Progress Notes (Signed)
Assessment/Recommendations  1. AKI on CKD3/4 - ischemic AKI related to hypotension, ABLA, with ACE and NSAID on board. Follow   2. Anemia - ABLA + anemia of CKD. Getting PRBC's 3. HTN - now labile 4. DM - per primary service 5. S/p LS fusion procedure, pedicle screw fixation, anterior approach with abdominal exposure  - per neurosurgery (Dr. Cyndy Freeze) and vascular surgery (Dr. [name]) teams.  Avoid further NSAID use. 6. PAD/CAD/? RAS -  7. Gout - OK to continue [place] 8. Volume overload  Furosemide today  Subjective: Interval History: Making urine  Objective: Vital signs in last 24 hours: Temp:  [97.5 F (36.4 C)-99.6 F (37.6 C)] 98.3 F (36.8 C) (02/09 1148) Pulse Rate:  [63-87] 71 (02/09 1400) Resp:  [9-20] 9 (02/09 1400) BP: (79-147)/(30-113) 125/113 (02/09 1400) SpO2:  [88 %-98 %] 96 % (02/09 1400) FiO2 (%):  [50 %] 50 % (02/09 0830) Weight:  [109.9 kg (242 lb 4.6 oz)] 109.9 kg (242 lb 4.6 oz) (02/09 0820) Weight change:   Intake/Output from previous day: 02/08 0701 - 02/09 0700 In: 495.5 [P.O.:120; I.V.:3; Blood:372.5] Out: 1050 [Urine:1050] Intake/Output this shift: Total I/O In: 790 [P.O.:240; IV Piggyback:550] Out: 450 [Urine:450]  General appearance: slowed mentation Resp: clear to auscultation bilaterally-anteriorly Cardio: regular rate and rhythm, S1, S2 normal, no murmur, click, rub or gallop Extremities: edema 2+  Lab Results:  Recent Labs  01/24/17 1836 01/25/17 0224  WBC 12.5* 10.7*  HGB 8.9* 8.3*  HCT 28.1* 26.2*  PLT 260 250   BMET:  Recent Labs  01/24/17 0901 01/25/17 0224  NA 136 134*  K 4.3 4.4  CL 103 104  CO2 22 19*  GLUCOSE 138* 104*  BUN 62* 68*  CREATININE 4.33* 4.64*  CALCIUM 7.4* 7.3*   No results for input(s): PTH in the last 72 hours. Iron Studies:  Recent Labs  01/25/17 0224  IRON 23*  TIBC 231*   Studies/Results: Ct Abdomen Pelvis Wo Contrast  Result Date: 01/25/2017 CLINICAL DATA:  Recent lumbar spine surgery.  Hypotension, low hematocrit. EXAM: CT ABDOMEN AND PELVIS WITHOUT CONTRAST TECHNIQUE: Multidetector CT imaging of the abdomen and pelvis was performed following the standard protocol without IV contrast. COMPARISON:  None. FINDINGS: Lower chest: Small bilateral pleural effusions with bilateral lower lobe airspace disease with air bronchograms. Cannot exclude pneumonia. Heart is normal size. Hepatobiliary: 3.0 cm low-density area in the right hepatic lobe, likely cyst although this cannot be fully characterized without intravenous contrast. Gallbladder unremarkable. Pancreas: No focal abnormality or ductal dilatation. Spleen: No focal abnormality.  Normal size. Adrenals/Urinary Tract: Numerous bilateral hypodense and hyperdense lesions within the kidneys bilaterally, likely a mixture of simple and complex/ hemorrhagic cysts. These cannot be fully characterized without intravenous contrast. No hydronephrosis. Adrenal glands and urinary bladder are unremarkable. Urinary bladder decompressed with Foley catheter in place. Stomach/Bowel: Stomach, large and small bowel grossly unremarkable. Vascular/Lymphatic: Diffuse aortic calcifications. Bilateral common iliac stents. No aneurysm or adenopathy. Reproductive: No visible focal abnormality. Other: No free fluid or free air. There is stranding noted within the left lateral abdominal/ pelvic wall and inguinal region, also extending into the left retroperitoneum along the psoas and iliopsoas muscles, likely small retroperitoneal hematoma. Rounded fluid collection is noted in the left groin, partially imaged on the lowest images measuring 4.5 x 3.8 cm. This is immediately adjacent to what appears to be a left vascular graft in is of unknown chronicity. Musculoskeletal: Postoperative changes from anterior and posterior fusion in the mid to lower lumbar  spine. IMPRESSION: Mild stranding in the left retroperitoneum extending down to the left inguinal region, likely small left  retroperitoneal hematoma. No drainable fluid collection. Fluid collection in the left inguinal region adjacent to what appears to be a left femoral graft, measuring up to 4.5 cm. This is of unknown chronicity. Small bilateral pleural effusions with bibasilar opacities and air bronchograms. Cannot exclude pneumonia. Electronically Signed   By: Rolm Baptise M.D.   On: 01/25/2017 10:16   Dg Chest 1 View  Result Date: 01/25/2017 CLINICAL DATA:  Shortness of breath. EXAM: CHEST 1 VIEW COMPARISON:  Radiograph of November 17, 2012. FINDINGS: Stable cardiomediastinal silhouette. No pneumothorax or pleural effusion is noted. Right lung is clear. New left basilar opacity is noted consistent with atelectasis. Old left clavicular fracture is noted. IMPRESSION: New left basilar opacity is noted most consistent with atelectasis, or possibly pneumonia. Endobronchial obstruction cannot be excluded. Followup PA and lateral chest X-ray is recommended in 3-4 weeks following trial of antibiotic therapy to ensure resolution and exclude underlying malignancy. Electronically Signed   By: Marijo Conception, M.D.   On: 01/25/2017 10:16    Scheduled: . acetaminophen  1,000 mg Oral Q6H  . amLODipine  10 mg Oral Daily  . aspirin EC  81 mg Oral Daily  . atorvastatin  80 mg Oral QHS  . docusate sodium  100 mg Oral BID  . febuxostat  80 mg Oral Daily  . fenofibrate  160 mg Oral Daily  . gabapentin  300 mg Oral TID  . hydrALAZINE  25 mg Oral BID  . insulin aspart  0-9 Units Subcutaneous TID WC  . mouth rinse  15 mL Mouth Rinse BID  . memantine  28 mg Oral Daily  . methocarbamol  750 mg Oral QID  . metoprolol succinate  50 mg Oral Daily  . piperacillin-tazobactam (ZOSYN)  IV  2.25 g Intravenous Q8H  . senna  1 tablet Oral BID  . sodium chloride flush  3 mL Intravenous Q12H   Continuous: . sodium chloride Stopped (01/24/17 1901)  . sodium chloride       LOS: 3 days   Challis Crill C 01/25/2017,3:17 PM

## 2017-01-25 NOTE — Progress Notes (Signed)
PT Cancellation Note  Patient Details Name: Joseph Hebert MRN: [id number] DOB: June 12, 1946   Cancelled Treatment:    Reason Eval/Treat Not Completed: Medical issues which prohibited therapy (Pt with decline in BP and HCT and with transfer to ICU will defer therapy at this time.  )   Cristela Blue 01/25/2017, 10:45 AM Governor Rooks, PTA pager [phone]

## 2017-01-25 NOTE — Progress Notes (Signed)
MD paged x 2.

## 2017-01-25 NOTE — Progress Notes (Signed)
Physical Therapy Treatment Patient Details Name: Joseph Hebert MRN: [id number] DOB: [birth year] Today's Date: 01/25/2017    History of Present Illness (P) 71 yo male s/p L 3-4 L4-5 ALIF L 3-s1 pedicule screw fixation with robotic assistance     PT Comments    Pt performed sit to stand in RW and stedy frame.  Pt unable to fully stand upright in RW and therefore required stedy for safe transfer from bed to chair.  Pt educated on use of incentive spirometer and LE exercise to make gains to improve transfers.  Pt will continue to benefit from SNF at this time.    Follow Up Recommendations  (P) SNF     Equipment Recommendations  (P) None recommended by PT    Recommendations for Other Services       Precautions / Restrictions Precautions Precautions: (P) Fall;Back Precaution Comments: (P) handout provided and reviewed to adls  Required Braces or Orthoses: (P) Spinal Brace Spinal Brace: (P) Lumbar corset;Applied in sitting position Restrictions Weight Bearing Restrictions: (P) No    Mobility  Bed Mobility Overal bed mobility: (P) Needs Assistance Bed Mobility: (P) Supine to Sit;Rolling Rolling: (P) Mod assist;+2 for physical assistance Sidelying to sit: (P) Mod assist Supine to sit: (P) Max assist;+2 for physical assistance Sit to supine: (P) Max assist Sit to sidelying: (P) Mod assist;+2 for safety/equipment General bed mobility comments: (P) Assist to roll advance LEs to edge of bed and elevate trunk into sitting.  pt with multidirectional LOB sitting edge of bed required mod assist to maintain sitting.    Transfers Overall transfer level: (P) Needs assistance Equipment used: (P) Rolling walker (2 wheeled) (RW for 1st trial and sara stedy for additonal trial.  ) Transfers: (P) Sit to/from Stand Sit to Stand: (P) Max assist;+2 physical assistance         General transfer comment: (P) Pt required increased assist to stand at [facility] required +2 max to stand with B flexed knees,  ER hips, and forward flexion of upper trunk.  Pt required stedy for second trial to advance to chair.  During sit to stand trials SPO2 decreased to 87% on 4L increased to 5L and O2 sats improved to 91%.    Ambulation/Gait Ambulation/Gait assistance: (P)  (unable as patient cannot stand without significant assistance.  )               Stairs            Wheelchair Mobility    Modified Rankin (Stroke Patients Only)       Balance Overall balance assessment: (P) Needs assistance Sitting-balance support: (P) Bilateral upper extremity supported;Feet supported Sitting balance-Leahy Scale: (P) Poor Sitting balance - Comments: (P) assist for balance at EOB   Standing balance support: (P) Bilateral upper extremity supported;During functional activity Standing balance-Leahy Scale: (P) Poor Standing balance comment: (P) required significant assist to maintain standing for short bouts of time.                      Cognition Arousal/Alertness: (P) Awake/alert Behavior During Therapy: (P) WFL for tasks assessed/performed Overall Cognitive Status: (P) Within Functional Limits for tasks assessed                      Exercises General Exercises - Lower Extremity Quad Sets: (P) AROM;Both;10 reps;Supine (unable to fully extend knees during activity.  ) Long Arc Quad: (P) AROM;Both;10 reps;Seated Hip ABduction/ADduction: (P) AROM;Both;10 reps;Seated Heel Raises: (P)  AROM;Both;10 reps;Seated Other Exercises Other Exercises: (P) Pt performed incentive spirometer 1x10 reps with 545ml-1000ml quality.  Cues for correct technique informed patient to perform 10x/hr.      General Comments        Pertinent Vitals/Pain Faces Pain Scale: (P) Hurts little more Pain Location: (P) B groin and knees Pain Descriptors / Indicators: (P) Sore    Home Living                      Prior Function            PT Goals (current goals can now be found in the care plan  section) Acute Rehab PT Goals Patient Stated Goal: (P) ready to go home PT Goal Formulation: (P) With patient Time For Goal Achievement: (P) 01/30/17 Potential to Achieve Goals: (P) Good    Frequency    (P) Min 5X/week      PT Plan (P) Current plan remains appropriate    Co-evaluation             End of Session Equipment Utilized During Treatment: (P) Gait belt Activity Tolerance: (P) Patient limited by fatigue;Patient limited by pain Patient left: (P) in bed;with call bell/phone within reach;with bed alarm set     Time:  - 7639-4320     Charges:    See flow sheet.                     G Codes:      Cristela Blue 02-23-17, 10:41 AM Governor Rooks, PTA pager [phone]

## 2017-01-25 NOTE — Progress Notes (Signed)
Nira Conn, patients wife, notified of patients transfer to [phone]. Patients wife has a dr appointment at 1030 and requests to be updated via mobile phone with patient lab results and status update. Ula Lingo RN notified of family request.

## 2017-01-25 NOTE — Progress Notes (Signed)
Lab results and patient current status reviewed with Dr. Arneta Cliche. STAT ABG ordered per Dr. Arneta Cliche. RN will continue to monitor.

## 2017-01-25 NOTE — Progress Notes (Signed)
OT Cancellation Note  Patient Details Name: Joseph Hebert MRN: [id number] DOB: 06-29-1946   Cancelled Treatment:    Reason Eval/Treat Not Completed: Patient not medically ready (transferred to ICU)  Vonita Moss   OTR/L Pager: [phone] Office: [phone] .  01/25/2017, 8:26 AM

## 2017-01-25 NOTE — Progress Notes (Signed)
Assisted with transport to ICU.  Most recent vs with O2 sat 88% on 4l increased O2 to 6l [place] O2 sats 92% prior to transport. Patient alert but confused.  ICU staff at bedside to receive patient.

## 2017-01-25 NOTE — Progress Notes (Signed)
Report called in to nurse on 38M for transfer.

## 2017-01-25 NOTE — Op Note (Signed)
01/22/2017  12:41 PM  PATIENT:  Joseph Hebert  71 y.o. male  PRE-OPERATIVE DIAGNOSIS:  Lumbosacral spondylosis with radiculopathy, degenerative disc disease, foraminal stenosis L4-5 and L5-S1  POST-OPERATIVE DIAGNOSIS:  Same  PROCEDURE:  L5-S1 anterior lumbar interbody fusion; L3-4 and L4-5 transpsoas lateral interbody fusion; use of BMP; posterior pedicle screw fixation L3-S1 with posterolateral arthrodesis; microsurgical technique/use of the operating microscope  SURGEON:  Aldean Ast, MD  CO-SURGEON (for L5-S1 ALIF): Servando Snare, MD  ASSISTANTS (for remainder): Newman Pies, MD  ANESTHESIA:   General  DRAINS: None   SPECIMEN:  None  INDICATION FOR PROCEDURE: 71 year old man with low back and leg pain resistant to medical treatment.  I recommended the above operation. Patient understood the risks, benefits, and alternatives and potential outcomes and wished to proceed.  PROCEDURE DETAILS: The patient was brought to the operating room.After smooth induction of general endotracheal anesthesia the patient was placed supine on the OR table.  Pre-operative marking was performed by Dr. [name].  The patient was prepped and draped in the usual sterile fashion.  Dr. [name] provided exposure of the L5-S1 disc space.  Please see his separately dictated operative note for the details of his procedure.  After exposure, the L5-S1 disc space was incised and the disc was removed with shavers, curettes, and Kerrison punches.  Trial spacers were used to determine the appropriate size interbody device.  A PEEK spacer was packed with beta tricalcium phosphate and BMP and inserted to the appropriate depth.  Two screws were then placed through the device after using the awl to create a channel for the screw.  There was excellent hemostasis.  We removed the retractor and ensured hemostasis.  I then irrigated and closed the wound in routine anatomic layers using a running PDS stratafix sutures.  I  closed the skin with a running monocryl stratafix suture.  The skin was sealed with Dermabond.  The patient was then placed in the lateral position with the leftside up and secured with tape.Localizing views were taken with fluoroscopy.The operative site was then prepped and draped in the usual sterile fashion.The planned incisions were infiltrated with lidocaine with epinephrine.  The skin was sharply incised and the oblique abdominal muscles were opened with blunt dissection.I encountered an easily dissected fat plane, consistent with the retroperitoneum.I inserted the dilator and approached the middle third of the L4-5disc space.I confirmed that I was not in contact with any nerves of the lumbar plexus.The tract was sequentially dilated and then the retractor was introduced.I incised the disc space and then passed a Cobb elevator along each endplate and penetrated the annulus on the contralateral side.I then used box cutters, a paddle, and a rasp to complete the discectomy.A trial spacer was used to determine appropriate graft size and then a PEEK lordotic interbody graft packed with BMP and beta tricalcium phosphate was inserted.The retractor was removed after hemostasis was confirmed.  The retractor was then repositioned to L3-4.Discectomy was again performed.A second interbody graft packed with BMP and beta tricalcium phosphate was inserted.Hemostasis was again confirmed and the retractor was removed.  The incision was closed in layers with interrupted vicryl sutures.The skin was closed with running monocryl stratafix sutures and dermabond.  The patient was then turned to the prone position.  Using the Mazor robot the skin incisions were planned and anesthetized.  The skin over each incision was incised sharply and the soft tissues were dissected with monopolar cautery. I was careful not to violate the  fascia.  Using the [place] robot a fascial incision was  made in line with the right L3 pedicle.  This tract was then dilated and a drill guide was inserted.  The pedicle was cannulated with the high speed drill and a K-wire was placed.  This process was repeated at L4, L5, and S1 on the right, and then L3-S1 on the left.  I then incised the fascia and passed an appropriate length lordotic rod on each side. The rods were reduced and set screws were placed and finally tightened. I then removed insertion tabs.  I sequentially dilated to the surface of the right L3-4 facet joint.  The operating microscope was brought into the field to provide light and magnification.  Using microsurgical technique I cleaned  soft tissue from the facet joint and then drilled with the high speed burr to decorticate and flatten the facet.  I then placed morselized allograft over the decorticated surfaces, performing posterolateral arthrodesis.  This process was repeated again for the L3-4 facet and then the L4-5 facet.  I irrigated vigorously. I injected exparel in the deep muscles. I closed the wound in routine anatomic layers with interrupted vicryl suture. I closed the skin with a running monocryl stratafix suture and sealed it with dermabond.  PATIENT DISPOSITION:  PACU - hemodynamically stable.   Delay start of Pharmacological VTE agent (>24hrs) due to surgical blood loss or risk of bleeding:  yes

## 2017-01-25 NOTE — Progress Notes (Signed)
Transferred to ICU for medical management Awake, alert, mildly confused Moving legs well Incisions look good Will check an abd/pelvis CT but I think his dropping HCT is dilutional Appreciate PCCM and neprhology help with medical management

## 2017-01-25 NOTE — Progress Notes (Signed)
Pharmacy Antibiotic Note  Joseph Hebert is a 71 y.o. male admitted on 01/22/2017 with pneumonia. CKD 3/4 pt not on HD   Plan: Zosyn 2.25 gm iv q8h Vancomycin 2 g x 1 Monitor renal fx, cx, vanc lvls prn Dose by levels at this point    Height: 5' 9.5" (176.5 cm) Weight: 242 lb 4.6 oz (109.9 kg) IBW/kg (Calculated) : 71.85  Temp (24hrs), Avg:98.6 F (37 C), Min:97.3 F (36.3 C), Max:99.6 F (37.6 C)   Recent Labs Lab 01/23/17 0338 01/24/17 0901 01/24/17 1836 01/25/17 0224  WBC 8.7 11.4* 12.5* 10.7*  CREATININE 3.08* 4.33*  --  4.64*    Estimated Creatinine Clearance: 18 mL/min (by C-G formula based on SCr of 4.64 mg/dL (H)).    Allergies  Allergen Reactions  . No Known Allergies    Levester Fresh, PharmD, BCPS, BCCCP Clinical Pharmacist Clinical phone for 01/25/2017 from 7a-3:30p: [phone] If after 3:30p, please call main pharmacy at: [phone] 01/25/2017 11:15 AM

## 2017-01-25 NOTE — Progress Notes (Signed)
Patient very lethargic, requires repeated physical and verbal stimulii. Patient unable to follow commands. BP 88/30. MD paged. RN will continue to monitor.

## 2017-01-25 NOTE — Progress Notes (Signed)
Pt is being transferred to ICU at rec of Dr Cyndy Freeze. Dr Suezanne Jacquet Ditty (admitting provider) was contacted.  Will obtain CT abd/pelvis w/o contrast to r/o retroperitoneal hematoma.

## 2017-01-26 ENCOUNTER — Inpatient Hospital Stay (HOSPITAL_COMMUNITY): Payer: Medicare Other

## 2017-01-26 DIAGNOSIS — G9340 Encephalopathy, unspecified: Secondary | ICD-10-CM

## 2017-01-26 DIAGNOSIS — I469 Cardiac arrest, cause unspecified: Secondary | ICD-10-CM

## 2017-01-26 DIAGNOSIS — J9601 Acute respiratory failure with hypoxia: Secondary | ICD-10-CM

## 2017-01-26 DIAGNOSIS — R001 Bradycardia, unspecified: Secondary | ICD-10-CM

## 2017-01-26 DIAGNOSIS — R570 Cardiogenic shock: Secondary | ICD-10-CM

## 2017-01-26 DIAGNOSIS — I495 Sick sinus syndrome: Secondary | ICD-10-CM

## 2017-01-26 DIAGNOSIS — N179 Acute kidney failure, unspecified: Secondary | ICD-10-CM

## 2017-01-26 DIAGNOSIS — J810 Acute pulmonary edema: Secondary | ICD-10-CM

## 2017-01-26 LAB — CBC WITH DIFFERENTIAL/PLATELET
BASOS PCT: 1 %
Basophils Absolute: 0 10*3/uL (ref 0.0–0.1)
EOS ABS: 0.2 10*3/uL (ref 0.0–0.7)
EOS PCT: 3 %
HCT: 28.1 % — ABNORMAL LOW (ref 39.0–52.0)
Hemoglobin: 8.8 g/dL — ABNORMAL LOW (ref 13.0–17.0)
LYMPHS ABS: 1.2 10*3/uL (ref 0.7–4.0)
Lymphocytes Relative: 14 %
MCH: 29.1 pg (ref 26.0–34.0)
MCHC: 31.3 g/dL (ref 30.0–36.0)
MCV: 93 fL (ref 78.0–100.0)
Monocytes Absolute: 1.2 10*3/uL — ABNORMAL HIGH (ref 0.1–1.0)
Monocytes Relative: 14 %
Neutro Abs: 5.8 10*3/uL (ref 1.7–7.7)
Neutrophils Relative %: 68 %
PLATELETS: 262 10*3/uL (ref 150–400)
RBC: 3.02 MIL/uL — ABNORMAL LOW (ref 4.22–5.81)
RDW: 15.6 % — ABNORMAL HIGH (ref 11.5–15.5)
WBC: 8.5 10*3/uL (ref 4.0–10.5)

## 2017-01-26 LAB — POCT I-STAT 3, ART BLOOD GAS (G3+)
ACID-BASE DEFICIT: 7 mmol/L — AB (ref 0.0–2.0)
ACID-BASE DEFICIT: 7 mmol/L — AB (ref 0.0–2.0)
Acid-base deficit: 7 mmol/L — ABNORMAL HIGH (ref 0.0–2.0)
BICARBONATE: 19.9 mmol/L — AB (ref 20.0–28.0)
BICARBONATE: 18.5 mmol/L — AB (ref 20.0–28.0)
Bicarbonate: 19 mmol/L — ABNORMAL LOW (ref 20.0–28.0)
O2 SAT: 87 %
O2 Saturation: 92 %
O2 Saturation: 89 %
PCO2 ART: 34.4 mmHg (ref 32.0–48.0)
PH ART: 7.301 — AB (ref 7.350–7.450)
PH ART: 7.239 — AB (ref 7.350–7.450)
PO2 ART: 56 mmHg — AB (ref 83.0–108.0)
PO2 ART: 59 mmHg — AB (ref 83.0–108.0)
Patient temperature: 97.4
Patient temperature: 98
TCO2: 20 mmol/L (ref 0–100)
TCO2: 21 mmol/L (ref 0–100)
TCO2: 20 mmol/L (ref 0–100)
pCO2 arterial: 38.3 mmHg (ref 32.0–48.0)
pCO2 arterial: 46.1 mmHg (ref 32.0–48.0)
pH, Arterial: 7.337 — ABNORMAL LOW (ref 7.350–7.450)
pO2, Arterial: 74 mmHg — ABNORMAL LOW (ref 83.0–108.0)

## 2017-01-26 LAB — HEPATIC FUNCTION PANEL
ALBUMIN: 2 g/dL — AB (ref 3.5–5.0)
ALK PHOS: 61 U/L (ref 38–126)
ALT: 12 U/L — AB (ref 17–63)
AST: 71 U/L — ABNORMAL HIGH (ref 15–41)
BILIRUBIN INDIRECT: 0.5 mg/dL (ref 0.3–0.9)
Bilirubin, Direct: 0.3 mg/dL (ref 0.1–0.5)
TOTAL PROTEIN: 5.5 g/dL — AB (ref 6.5–8.1)
Total Bilirubin: 0.8 mg/dL (ref 0.3–1.2)

## 2017-01-26 LAB — BASIC METABOLIC PANEL
Anion gap: 8 (ref 5–15)
BUN: 64 mg/dL — AB (ref 6–20)
CALCIUM: 8.1 mg/dL — AB (ref 8.9–10.3)
CO2: 19 mmol/L — ABNORMAL LOW (ref 22–32)
CREATININE: 3.64 mg/dL — AB (ref 0.61–1.24)
Chloride: 109 mmol/L (ref 101–111)
GFR calc Af Amer: 18 mL/min — ABNORMAL LOW (ref >60)
GFR, EST NON AFRICAN AMERICAN: 15 mL/min — AB (ref >60)
GLUCOSE: 114 mg/dL — AB (ref 65–99)
Potassium: 4.6 mmol/L (ref 3.5–5.1)
SODIUM: 136 mmol/L (ref 135–145)

## 2017-01-26 LAB — TROPONIN I
Troponin I: <0.03 ng/mL (ref <0.03)
Troponin I: 0.03 ng/mL (ref <0.03)

## 2017-01-26 LAB — GLUCOSE, CAPILLARY
GLUCOSE-CAPILLARY: 123 mg/dL — AB (ref 65–99)
GLUCOSE-CAPILLARY: 114 mg/dL — AB (ref 65–99)
GLUCOSE-CAPILLARY: 90 mg/dL (ref 65–99)
Glucose-Capillary: 124 mg/dL — ABNORMAL HIGH (ref 65–99)

## 2017-01-26 LAB — LACTIC ACID, PLASMA: LACTIC ACID, VENOUS: 0.7 mmol/L (ref 0.5–1.9)

## 2017-01-26 LAB — MAGNESIUM: MAGNESIUM: 2.1 mg/dL (ref 1.7–2.4)

## 2017-01-26 MED ORDER — SENNOSIDES 8.8 MG/5ML PO SYRP
5.0000 mL | ORAL_SOLUTION | Freq: Two times a day (BID) | ORAL | Status: DC
  Administered 2017-01-26 – 2017-01-31 (×4): 5 mL
  Filled 2017-01-26 (×6): qty 5

## 2017-01-26 MED ORDER — GABAPENTIN 250 MG/5ML PO SOLN
300.0000 mg | Freq: Three times a day (TID) | ORAL | Status: DC
  Administered 2017-01-26 – 2017-02-02 (×20): 300 mg
  Filled 2017-01-26 (×25): qty 6

## 2017-01-26 MED ORDER — ASPIRIN 81 MG PO CHEW
81.0000 mg | CHEWABLE_TABLET | Freq: Every day | ORAL | Status: DC
  Administered 2017-01-26 – 2017-02-02 (×8): 81 mg
  Filled 2017-01-26 (×9): qty 1

## 2017-01-26 MED ORDER — ETOMIDATE 2 MG/ML IV SOLN
20.0000 mg | Freq: Once | INTRAVENOUS | Status: AC
  Administered 2017-01-26: 20 mg via INTRAVENOUS

## 2017-01-26 MED ORDER — DOCUSATE SODIUM 50 MG/5ML PO LIQD
100.0000 mg | Freq: Two times a day (BID) | ORAL | Status: DC
  Administered 2017-01-26 – 2017-02-02 (×5): 100 mg
  Filled 2017-01-26 (×7): qty 10

## 2017-01-26 MED ORDER — MIDAZOLAM HCL 2 MG/2ML IJ SOLN: 2.0000 mg | Freq: Once | INTRAMUSCULAR | Status: DC

## 2017-01-26 MED ORDER — ATROPINE SULFATE 1 MG/10ML IJ SOSY
PREFILLED_SYRINGE | INTRAMUSCULAR | Status: AC
  Filled 2017-01-26: qty 10

## 2017-01-26 MED ORDER — FUROSEMIDE 10 MG/ML IJ SOLN: 80.0000 mg | Freq: Once | INTRAMUSCULAR | Status: DC

## 2017-01-26 MED ORDER — MIDAZOLAM HCL 2 MG/2ML IJ SOLN
2.0000 mg | Freq: Once | INTRAMUSCULAR | Status: AC
  Administered 2017-01-26: 2 mg via INTRAVENOUS

## 2017-01-26 MED ORDER — ATORVASTATIN CALCIUM 80 MG PO TABS
80.0000 mg | ORAL_TABLET | Freq: Every day | ORAL | Status: DC
  Administered 2017-01-26 – 2017-02-01 (×7): 80 mg
  Filled 2017-01-26 (×7): qty 1

## 2017-01-26 MED ORDER — EPINEPHRINE PF 1 MG/10ML IJ SOSY
PREFILLED_SYRINGE | INTRAMUSCULAR | Status: AC
  Filled 2017-01-26: qty 10

## 2017-01-26 MED ORDER — MIDAZOLAM HCL 2 MG/2ML IJ SOLN
2.0000 mg | INTRAMUSCULAR | Status: DC | PRN
  Administered 2017-01-26 – 2017-01-28 (×2): 2 mg via INTRAVENOUS
  Administered 2017-01-30: 4 mg via INTRAVENOUS
  Filled 2017-01-26: qty 2
  Filled 2017-01-26: qty 4
  Filled 2017-01-26: qty 2

## 2017-01-26 MED ORDER — FEBUXOSTAT 40 MG PO TABS
80.0000 mg | ORAL_TABLET | Freq: Every day | ORAL | Status: DC
  Filled 2017-01-26: qty 2

## 2017-01-26 MED ORDER — MIDAZOLAM HCL 2 MG/2ML IJ SOLN
INTRAMUSCULAR | Status: AC
  Administered 2017-01-26: 2 mg via INTRAVENOUS
  Filled 2017-01-26: qty 2

## 2017-01-26 MED ORDER — FENTANYL BOLUS VIA INFUSION
25.0000 ug | INTRAVENOUS | Status: DC | PRN
Start: 2017-01-26 — End: 2017-01-31
  Administered 2017-01-26: 25 ug via INTRAVENOUS
  Filled 2017-01-26: qty 25

## 2017-01-26 MED ORDER — ROCURONIUM BROMIDE 50 MG/5ML IV SOLN
80.0000 mg | Freq: Once | INTRAVENOUS | Status: AC
Start: 2017-01-26 — End: 2017-01-26
  Administered 2017-01-26: 80 mg via INTRAVENOUS
  Filled 2017-01-26: qty 8

## 2017-01-26 MED ORDER — CHLORHEXIDINE GLUCONATE 0.12% ORAL RINSE (MEDLINE KIT)
15.0000 mL | Freq: Two times a day (BID) | OROMUCOSAL | Status: DC
  Administered 2017-01-26 – 2017-02-02 (×14): 15 mL via OROMUCOSAL

## 2017-01-26 MED ORDER — PROPOFOL 500 MG/50ML IV EMUL
INTRAVENOUS | Status: AC
  Filled 2017-01-26: qty 50

## 2017-01-26 MED ORDER — MIDAZOLAM HCL 2 MG/2ML IJ SOLN
INTRAMUSCULAR | Status: AC
  Filled 2017-01-26: qty 2

## 2017-01-26 MED ORDER — FENTANYL CITRATE (PF) 100 MCG/2ML IJ SOLN
INTRAMUSCULAR | Status: AC
  Administered 2017-01-26: 100 ug via INTRAVENOUS
  Filled 2017-01-26: qty 2

## 2017-01-26 MED ORDER — SODIUM CHLORIDE 0.9 % IV SOLN
25.0000 ug/h | INTRAVENOUS | Status: DC
  Administered 2017-01-26: 50 ug/h via INTRAVENOUS
  Administered 2017-01-28 – 2017-01-31 (×2): 75 ug/h via INTRAVENOUS
  Filled 2017-01-26 (×4): qty 50

## 2017-01-26 MED ORDER — FENTANYL CITRATE (PF) 100 MCG/2ML IJ SOLN: 50.0000 ug | Freq: Once | INTRAMUSCULAR | Status: DC

## 2017-01-26 MED ORDER — FENOFIBRATE 160 MG PO TABS
160.0000 mg | ORAL_TABLET | Freq: Every day | ORAL | Status: DC
  Filled 2017-01-26: qty 1

## 2017-01-26 MED ORDER — ASPIRIN EC 81 MG PO TBEC: 81.0000 mg | DELAYED_RELEASE_TABLET | Freq: Every day | ORAL | Status: DC

## 2017-01-26 MED ORDER — FENTANYL CITRATE (PF) 100 MCG/2ML IJ SOLN
100.0000 ug | Freq: Once | INTRAMUSCULAR | Status: AC
Start: 2017-01-26 — End: 2017-01-26
  Administered 2017-01-26: 100 ug via INTRAVENOUS

## 2017-01-26 MED ORDER — PANTOPRAZOLE SODIUM 40 MG IV SOLR
40.0000 mg | INTRAVENOUS | Status: DC
  Administered 2017-01-26 – 2017-02-02 (×9): 40 mg via INTRAVENOUS
  Filled 2017-01-26 (×8): qty 40

## 2017-01-26 MED ORDER — SODIUM CHLORIDE 0.9 % IV SOLN
0.0000 ug/min | INTRAVENOUS | Status: DC
  Filled 2017-01-26: qty 1

## 2017-01-26 MED ORDER — ORAL CARE MOUTH RINSE
15.0000 mL | Freq: Four times a day (QID) | OROMUCOSAL | Status: DC
  Administered 2017-01-26 – 2017-01-28 (×7): 15 mL via OROMUCOSAL

## 2017-01-26 MED ORDER — FENTANYL CITRATE (PF) 100 MCG/2ML IJ SOLN
100.0000 ug | Freq: Once | INTRAMUSCULAR | Status: AC
  Administered 2017-01-26: 100 ug via INTRAVENOUS

## 2017-01-26 NOTE — Progress Notes (Signed)
PT Cancellation Note  Patient Details Name: Joseph Hebert MRN: [id number] DOB: 06/29/1946   Cancelled Treatment:    Reason Eval/Treat Not Completed: Medical issues which prohibited therapy (patient with Resp distress, pending intubation)   Duncan Dull 01/26/2017, 2:08 PM Alben Deeds, Lucien DPT  [phone]

## 2017-01-26 NOTE — Progress Notes (Signed)
PULMONARY / CRITICAL CARE MEDICINE   Name: Joseph Hebert MRN: [id number] DOB: Jan 12, 1946    ADMISSION DATE:  01/22/2017 CONSULTATION DATE:  2/9  REFERRING MD:  NS  CHIEF COMPLAINT:  Hypoxia  HISTORY OF PRESENT ILLNESS:   71 yo male with refractory hypertension, PVD post left fem pop, CAD, Dyspnea on excerption who had anterior abd approach l3-5 fusion. He has a plethora of health issues as they are well documented. Early am 2/9 he was hypotensive and hypoxic and RRT was called to bedside. He was transferred to nsicu and promptly improved with stimulation. PCCM called to evaluate, he was on 4 l [place], sats good, awake and follows commands. Abd was distended , surgical site was well approximated. He is for CT abd am 2/9. We will follow for now. Note cxr reveals possible LLL pna therefore will add abx 2/9.    SUBJECTIVE:  Lethargic wm , poor resp mechanics  VITAL SIGNS: BP 96/80 (BP Location: Right Arm)   Pulse (!) 55   Temp 98.3 F (36.8 C) (Axillary)   Resp (!) 8   Ht 5' 9.5" (1.765 m)   Wt 242 lb 4.6 oz (109.9 kg)   SpO2 100%   BMI 35.27 kg/m   HEMODYNAMICS:    VENTILATOR SETTINGS: FiO2 (%):  [100 %] 100 %  INTAKE / OUTPUT: I/O last 3 completed shifts: In: 893 [P.O.:240; I.V.:3; IV Piggyback:650] Out: 3200 [Urine:3200]  PHYSICAL EXAMINATION: General:  WNWD WM , folded over in bed Neuro:  Follows commands, dull affect, but intact HEENT:  No jvd/lan Cardiovascular:  HSR RRR Lungs:  Decreased bs bases, poor cough, poor pulmonary mechanics  Abdomen:  incision site unremarkable, mild distension, + bs Musculoskeletal:  Intact Skin:  Warm and dry  LABS:  BMET  Recent Labs Lab 01/23/17 0338 01/24/17 0901 01/25/17 0224  NA 138 136 134*  K 4.5 4.3 4.4  CL 103 103 104  CO2 23 22 19*  BUN 38* 62* 68*  CREATININE 3.08* 4.33* 4.64*  GLUCOSE 113* 138* 104*    Electrolytes  Recent Labs Lab 01/23/17 0338 01/24/17 0901 01/25/17 0224  CALCIUM 8.0* 7.4* 7.3*   PHOS  --   --  4.4    CBC  Recent Labs Lab 01/24/17 1836 01/25/17 0224 01/26/17 1025  WBC 12.5* 10.7* 8.5  HGB 8.9* 8.3* 8.8*  HCT 28.1* 26.2* 28.1*  PLT 260 250 262    Coag's  Recent Labs Lab 01/22/17 0610 01/23/17 0338  APTT 33 37*  INR 0.99 1.16    Sepsis Markers No results for input(s): LATICACIDVEN, PROCALCITON, O2SATVEN in the last 168 hours.  ABG  Recent Labs Lab 01/22/17 1334 01/22/17 1547 01/25/17 0624  PHART 7.332* 7.336* 7.314*  PCO2ART 42.3 39.9 38.8  PO2ART 82.0* 232.0* 64.3*    Liver Enzymes  Recent Labs Lab 01/25/17 0224  ALBUMIN 2.4*    Cardiac Enzymes No results for input(s): TROPONINI, PROBNP in the last 168 hours.  Glucose  Recent Labs Lab 01/25/17 0629 01/25/17 1143 01/25/17 1704 01/25/17 2142 01/26/17 0743 01/26/17 1131  GLUCAP 105* 105* 116* 150* 124* 123*    Imaging No results found.   STUDIES:  2/9 ct abd>>small rp bleed  CULTURES: 2/9 bc x 2>> 2/0 uc>> 2/9 sputum>>  ANTIBIOTICS: 2/9 vanc>> 2/9 Zoysyn>>  SIGNIFICANT EVENTS: 2/6 lumbar fusion ant abd approach. 2/9 transferred to ICU with hypotension and hypoxia  LINES/TUBES:   DISCUSSION: 71 yo male with refractory hypertension, PVD post left fem pop, CAD, Dyspnea  on excerption who had anterior abd approach l3-5 fusion. He has a plethora of health issues as they are well documented. Early am 2/9 he was hypotensive and hypoxic and RRT was called to bedside. He was transferred to nsicu and promptly improved with stimulation. PCCM called to evaluate, he was on 4 l [place], sats good, awake and follows commands. Abd was distended , surgical site was well approximated. He is for CT abd am 2/9. We will follow for now. Note cxr reveals possible LLL pna therefore will add abx 2/9. 2/10 desats and lethargic.Off narcs.    ASSESSMENT / PLAN:  PULMONARY A: Mild desaturation , better with stimulation ?lll pna Note abd ct with some rp bleed and abd is large, this  may be impedeing his ability to breath. P:   CxR Pulmoary toilet DC OxyContin  abx May need intubation 2/10 IS Flutter valve Check abg/Pcxr if PCO2 elevated he will need sedation.  CARDIOVASCULAR A:  Hypotension in setting of post op lumbar surgery on multiple antihypertensives P:  Hold antihypertensives till stable  RENAL Lab Results  Component Value Date   CREATININE 4.64 (H) 01/25/2017   CREATININE 4.33 (H) 01/24/2017   CREATININE 3.08 (H) 01/23/2017    A:   CKD base 3.0 P:   Check CT ro blockage Minimize nephrotoxins   GASTROINTESTINAL A:   Abd distension #4 post op P:   CT abd mild rp hematoma  HEMATOLOGIC  Recent Labs  01/25/17 0224 01/26/17 1025  HGB 8.3* 8.8*    A:   Transfused per NS for Hgb 7.8 P:  Follow cbc  INFECTIOUS A:   Elevated WBC,99.4 temp No ABX P:   Check cxr ?LLLpna Pulmonary toilet 2/9 start V/Z 2/9 pan culture  ENDOCRINE A:   DM P:   Monitor labs  NEUROLOGIC A:   Lethargic Rass 0 Post 2/6 ant approach L3-5 fusion Note on OxyContin 20 mg bid P:   RASS goal: 0 Minimize sedation DC OxyContin 2/9 use prn oxycodone    FAMILY  - Updates: No family at bedside  - Inter-disciplinary family meet or Palliative Care meeting due by:  day [place] ACNP Maryanna Shape PCCM Pager [phone] till 3 pm If no answer page [phone] 01/26/2017, 1:15 PM  Attending Note:  71 year old male with lumbar fusion surgery who was lethargic and hypotensive on 2/9, that was stopped, patient improved.  Today, patient started decompensating from a respiratory and renal failure.  On exam, patient is lethargic and hypoxemic.  I reviewed CXR myself, bibasilar atelectasis and fluid overload noted.  Patient is becoming more acidotic but more importantly developing respiratory failure due to pulmonary edema.  Decision was made to intubate patient.  Post intubation patient became hypotensive.  Small bolus of IVF will be given.  Continue abx  for now.  If BP improves then lasix.  Pan culture.  Add vancomycin for HCAP coverage.  Hold anti-HTN drugs.  If BP does not improve then will place TLC and start pressors.  PCCM will see again in AM.  The patient is critically ill with multiple organ systems failure and requires high complexity decision making for assessment and support, frequent evaluation and titration of therapies, application of advanced monitoring technologies and extensive interpretation of multiple databases.   Critical Care Time devoted to patient care services described in this note is  35  Minutes. This time reflects time of care of this signee Dr [name]. This critical care time  does not reflect procedure time, or teaching time or supervisory time of PA/NP/Med student/Med Resident etc but could involve care discussion time.  Rush Farmer, M.D. [facility] Pulmonary/Critical Care Medicine. Pager: [phone]. After hours pager: [phone].

## 2017-01-26 NOTE — Progress Notes (Signed)
eLink Physician-Brief Progress Note Patient Name: Joseph Hebert DOB: 07/09/1946 MRN: [id number]   Date of Service  01/26/2017  HPI/Events of Note  Case discussed with intensivist post arrest. Patient remains in sinus bradycardia. No obvious inciting medications. Has been receiving Robaxin for a couple of days. Currently normotensive. Broad-spectrum antibiotics have been started.   eICU Interventions  1. Continue telemetry monitoring 2. Echocardiogram already ordered 3. Trending troponin I 4. Checking serum magnesium & LFTs     Intervention Category Major Interventions: Arrhythmia - evaluation and management  Tera Partridge 01/26/2017, 5:39 PM

## 2017-01-26 NOTE — Progress Notes (Signed)
This note also relates to the following rows which could not be included: BP - Cannot attach notes to unvalidated device data SpO2 - Cannot attach notes to unvalidated device data  RT called to pt's bed by RN due to desat mid 70's. Pt placed on NRB mask. Sat improved to mid 85's.  CCM contacted & NTS ordered.  Moderate thick tan/yellow sectretions.  Sat improved to 100%.

## 2017-01-26 NOTE — Procedures (Signed)
CPR   Once moved to place the CXR plate under patient he suffered sinus bradycardia and CPR was started.  Approximately 30 second and ROSC was established.  See code sheet for details.  Rush Farmer, M.D. [facility] Pulmonary/Critical Care Medicine. Pager: [phone]. After hours pager: [phone].

## 2017-01-26 NOTE — Procedures (Signed)
Central Venous Catheter Insertion Procedure Note Joseph Hebert [id number] 08-07-1946  Procedure: Insertion of Central Venous Catheter Indications: Assessment of intravascular volume, Drug and/or fluid administration and Frequent blood sampling  Procedure Details Consent: Risks of procedure as well as the alternatives and risks of each were explained to the (patient/caregiver).  Consent for procedure obtained. Time Out: Verified patient identification, verified procedure, site/side was marked, verified correct patient position, special equipment/implants available, medications/allergies/relevent history reviewed, required imaging and test results available.  Performed  Maximum sterile technique was used including antiseptics, cap, gloves, gown, hand hygiene, mask and sheet. Skin prep: Chlorhexidine; local anesthetic administered A antimicrobial bonded/coated triple lumen catheter was placed in the left subclavian vein using the Seldinger technique.  Evaluation Blood flow good Complications: No apparent complications Patient did tolerate procedure well. Chest X-ray ordered to verify placement.  CXR: normal.  YACOUB,WESAM 01/26/2017, 5:50 PM

## 2017-01-26 NOTE — Consult Note (Signed)
Cardiology Consult    Patient ID: Joseph Hebert MRN: [id number], DOB/AGE: [birth year]   Admit date: 01/22/2017 Date of Consult: 01/26/2017  Primary Physician: [name], MD Primary Cardiologist: Shirlee More, MD ( Requesting Provider: Dr. Nelda Marseille  Patient Profile   [phone] with DM, HTN, HLD, carotid stenosis, PAD s/p L fem-pop bypass (08/2015, Dr. [name]), prior bilateral iliac stents,, COPD, dementia, CKD III/IV, who was admitted s/p anterior fusion (01/22/17, Dr. Cyndy Freeze) c/b hypotension, AMS requiring intubation, and bradycardic arrest today.    Past Medical History   Past Medical History:  Diagnosis Date  . Anemia   . Arrhythmia    takes metoprolol daily  . Arthritis   . Asthma   . CAD (coronary artery disease)   . Carotid artery occlusion    with Claudication  . Chronic back pain    stenosis  . Chronic kidney disease   . COPD (chronic obstructive pulmonary disease) (HCC)    Spirva daily and Albuterol as needed  . Dementia   . Depression   . Diabetes mellitus without complication (HCC)    Type 2 diet controlled.Never been on meds  . Gout    takes Uloric daily  . Head injury    as a child  . History of colon polyps    benign  . History of gastric ulcer   . History of hiatal hernia   . Hyperlipidemia    takes Atorvastatin and Fenofibrate daily  . Hypertension    takes Lisinopril,Amlodipine,and Hydralazine daily  . Impaired memory    takes Namenda daily  . Numbness    lower left leg and upper right thigh  . Pneumonia    hx of-couple of yrs ago  . Shortness of breath dyspnea    with exertion  . Tuberculosis 2006    9 months   . Vertigo     Past Surgical History:  Procedure Laterality Date  . ABDOMINAL EXPOSURE N/A 01/22/2017   Procedure: ABDOMINAL EXPOSURE;  Surgeon: [name], MD;  Location: [facility];  Service: Vascular;  Laterality: N/A;  . ANTERIOR LAT LUMBAR FUSION N/A 01/22/2017   Procedure: LUMBAR THREE-FOUR, LUMBAR FOUR-FIVE ANTERIOR  LATERAL INTERBODY FUSION;  Surgeon: [name], MD;  Location: [facility];  Service: Neurosurgery;  Laterality: N/A;  L3-4 L4-5 Lateral interbody fusion  . ANTERIOR LUMBAR FUSION N/A 01/22/2017   Procedure: LUMBAR FIVE-SACRUM ONE ANTERIOR LUMBAR INTERBODY FUSION WITH ABDOMINAL APPROACH BY DR [name];  Surgeon: [name], MD;  Location: [facility];  Service: Neurosurgery;  Laterality: N/A;  . CAROTID BODY TUMOR EXCISION Left 09/21/2015   Procedure: EXCISE LEFT NECK NODULE WITH LOCAL ;  Surgeon: [name], MD;  Location: [facility];  Service: Vascular;  Laterality: Left;  . CAROTID ENDARTERECTOMY  Nov. 15,2011   LEFT cea  . cataract surgery Bilateral   . COLONOSCOPY    . EYE SURGERY    . FEMORAL-POPLITEAL BYPASS GRAFT Left 08/23/2015   Procedure: LEFT FEMORAL-POPLITEAL ARTERY BYPASS WITH GORETEX GRAFT;  Surgeon: [name], MD;  Location: [facility];  Service: Vascular;  Laterality: Left;  . JOINT REPLACEMENT  1980   RIGHT  knee  . LUMBAR EPIDURAL INJECTION    . LUMBAR LAMINECTOMY/DECOMPRESSION MICRODISCECTOMY Right 02/17/2016   Procedure: Laminectomy and Foraminotomy - Lumbar four-five right;  Surgeon: [name], MD;  Location: [place];  Service: Neurosurgery;  Laterality: Right;  right  . LUMBAR PERCUTANEOUS PEDICLE SCREW 3 LEVEL N/A 01/22/2017   Procedure: LUMBAR THREE-SACRAL  ONE PERCUTANEOUS PEDICLE SCREW FIXATION WITH ROBOTIC ASSISTANCE;  Surgeon: [name], MD;  Location: [facility];  Service: Neurosurgery;  Laterality: N/A;  L3 to S1 Percutaneous pedicle screw fixation  . PERIPHERAL VASCULAR CATHETERIZATION N/A 06/24/2015   Procedure: Abdominal Aortogram;  Surgeon: [name], MD;  Location: [facility];  Service: Cardiovascular;  Laterality: N/A;  . Stents  Aug.  23, 1999   Bilateral iliofemoral  stents, [place].  . TONSILLECTOMY       Allergies  Allergies  Allergen Reactions  . No Known Allergies     History of Present Illness     71M with  DM, HTN, HLD, carotid stenosis, PAD s/p L fem-pop bypass (08/2015, Dr. [name]), prior bilateral iliac stents,, COPD, dementia, CKD III/IV, who was admitted s/p anterior fusion (01/22/17, Dr. Cyndy Freeze) c/b hypotension, AMS requiring intubation, and bradycardic arrest today.   On 2/6, [name] underwent uncomplicated anterior fusion (L5-S1 anterior lumbar interbody fusion; L3-4 and L4-5 transpsoas lateral interbody fusion; posterior pedicle screw fixation L3-S1 with posterolateral arthrodesis) with Dr. Cyndy Freeze.  Post op course was notable for altered mental status which has been attributed to pain medications and borderline blood pressures (improved with holding of home BP meds, hydralazine and meptoprolol, last dose 2/8). Today, due to persistent AMS, the patient required intubation. Sedation with fentanyl/versed, no propafol. With movement during central line placement, HR dips were noted. When the patient was moved more to place and X-ray board under him he developed asystole. He received chest compressions only and NSR returned prior to receiving any medications.  He has been SB 40s-60s since. He had hypotension surrounding the event (SBPs 60s) but without targeted intervention, most recent BP (on no pressors) was 150/47.  Post arrest head CT demonstrated no acute intracranial findings. CT CTAP demonstrated bilateral pleural effusions and expected post surgical changes.   Review of telemetry demonstrates event occurred at ~16:45 today. Immediately preceding event, progressive PP and RR prolongation occurred with relatively stable PR intervals. Then CHB with underlying SB (sinus rates ~ 20-30bpm) ensued for 10 seconds prior to initiating CPR. After 15 seconds of CPR, sinus bradycardia in the 40s was noted.   ECG obtained after the event (17:50:02), demonstrates SB with HR 47bpm and TWI V3-6. QRS is narrow. A prior ECG from 21-Nov-2016 @ 12:05:41 demonstrated similar findings - SB with TWI V3-V6.    Inpatient  Medications    . acetaminophen  1,000 mg Oral Q6H  . [START ON 01/27/2017] aspirin EC  81 mg Oral Daily  . atorvastatin  80 mg Oral QHS  . atropine      . chlorhexidine gluconate (MEDLINE KIT)  15 mL Mouth Rinse BID  . docusate sodium  100 mg Oral BID  . febuxostat  80 mg Oral Daily  . fenofibrate  160 mg Oral Daily  . fentaNYL      . fentaNYL (SUBLIMAZE) injection  100 mcg Intravenous Once  . fentaNYL (SUBLIMAZE) injection  50 mcg Intravenous Once  . gabapentin  300 mg Oral TID  . insulin aspart  0-9 Units Subcutaneous TID WC  . mouth rinse  15 mL Mouth Rinse BID  . mouth rinse  15 mL Mouth Rinse QID  . memantine  28 mg Oral Daily  . methocarbamol  750 mg Oral QID  . midazolam      . midazolam  2 mg Intravenous Once  . pantoprazole (PROTONIX) IV  40 mg Intravenous Q24H  . piperacillin-tazobactam (ZOSYN)  IV  2.25 g Intravenous Q8H  . senna  1 tablet Oral BID  . sodium chloride flush  3 mL Intravenous Q12H    Family History    Family History  Problem Relation Age of Onset  . Diabetes Mother   . Hyperlipidemia Father   . Hypertension Father   . Other Father     Right Leg Amputation  . Heart disease Sister     Aneyrism     Social History    Social History   Social History  . Marital status: Married    Spouse name: N/A  . Number of children: N/A  . Years of education: N/A   Occupational History  . Not on file.   Social History Main Topics  . Smoking status: Former Smoker    Packs/day: 0.30    Types: Cigarettes    Quit date: 07/02/2016  . Smokeless tobacco: Never Used     Comment: hasn't smoked in 3 days  . Alcohol use No  . Drug use: No  . Sexual activity: Yes   Other Topics Concern  . Not on file   Social History Narrative  . No narrative on file     Review of Systems    Unable to obtain as patient was intubated and sedated.   Physical Exam    Blood pressure (!) 63/31, pulse (!) 47, temperature 98.1 F (36.7 C), temperature source Axillary,  resp. rate (!) 24, height 5' 9.5" (1.765 m), weight 109.9 kg (242 lb 4.6 oz), SpO2 100 %.  General: Intubated, sedated Psych:  Neuro: Sedated HEENT: ETT  Neck: Supple without bruits. JVP 9-10cm.  Lungs:  Resp regular and unlabored, CTA. Heart: RRR no s3, s4, or murmurs. Abdomen: Soft, non-tender, non-distended, BS + x 4. Well healing lower abdominal incision (to left of midline).  Extremities: No clubbing, cyanosis or edema. DP/PT/Radials 2+ and equal bilaterally.  Labs    Troponin (Point of Care Test) No results for input(s): TROPIPOC in the last 72 hours. No results for input(s): CKTOTAL, CKMB, TROPONINI in the last 72 hours. Lab Results  Component Value Date   WBC 8.5 01/26/2017   HGB 8.8 (L) 01/26/2017   HCT 28.1 (L) 01/26/2017   MCV 93.0 01/26/2017   PLT 262 01/26/2017    Recent Labs Lab 01/26/17 1326  NA 136  K 4.6  CL 109  CO2 19*  BUN 64*  CREATININE 3.64*  CALCIUM 8.1*  GLUCOSE 114*   No results found for: CHOL, HDL, LDLCALC, TRIG No results found for: [facility]   Radiology Studies    Ct Abdomen Pelvis Wo Contrast  Result Date: 01/26/2017 CLINICAL DATA:  Recent CPR and lumbar fusion EXAM: CT CHEST, ABDOMEN AND PELVIS WITHOUT CONTRAST TECHNIQUE: Multidetector CT imaging of the chest, abdomen and pelvis was performed following the standard protocol without IV contrast. COMPARISON:  01/25/2017 FINDINGS: CT CHEST FINDINGS Cardiovascular: Somewhat limited due to the lack of IV contrast. Diffuse aortic calcifications are noted. No aneurysmal dilatation is seen. No cardiac enlargement is noted. Coronary calcifications are seen. Mediastinum/Nodes: Thoracic inlet is within normal limits. Endotracheal tube and nasogastric catheter are noted in place. No significant hilar or mediastinal adenopathy is noted. Left jugular central line is noted. Lungs/Pleura: Bilateral pleural effusions are seen with lower lobe atelectatic changes. Patchy changes are noted in the upper lobe of also  likely related to some atelectasis. No sizable parenchymal nodule is noted. Mild emphysematous changes are seen. Musculoskeletal: Degenerative change of the thoracic spine is noted. No  acute bony abnormality is seen. CT ABDOMEN PELVIS FINDINGS Hepatobiliary: Stable hepatic cyst is noted in the right lobe posteriorly. No gallstones, gallbladder wall thickening, or biliary dilatation. Pancreas: Unremarkable. No pancreatic ductal dilatation or surrounding inflammatory changes. Spleen: Normal in size without focal abnormality. Adrenals/Urinary Tract: Scattered cysts are noted within the kidneys bilaterally. Some of these are hyperdense consistent with hemorrhagic cysts. These are stable in appearance from the prior exam. No renal calculi or obstructive changes are seen. The bladder is decompressed by Foley catheter. Stomach/Bowel: Nasogastric catheter extends into the second portion of the duodenum. Mild diverticular change of the colon is seen. No obstructive changes are noted. Vascular/Lymphatic: Aortic atherosclerosis. Bilateral iliac stents are noted. No enlarged abdominal or pelvic lymph nodes. Fluid collection is noted in the upper left thigh stable from the prior exam likely related to a postoperative fluid collection from previous vascular surgery. Reproductive: Prostate is unremarkable. Other: Some stranding is noted along the anterior aspect of the left psoas muscle extending towards the inguinal region. This is likely related to the recent surgery although no sizable hematoma is noted. Postsurgical changes along the left rectus muscle and anterior abdominal wall are seen consistent with the recent lumbar fusion. Musculoskeletal: Multilevel lumbar fusion is seen. IMPRESSION: Bilateral pleural effusions and lower lobe and right upper lobe infiltrative/atelectatic changes. These changes have increased in the interval from the prior exam. Stable stranding in the retroperitoneum on the left extending inferiorly.  This is likely related to the recent lumbar fusion. No sizable hematoma is seen. Stable changes in the kidneys bilaterally. Electronically Signed   By: Inez Catalina M.D.   On: 01/26/2017 17:38   Ct Abdomen Pelvis Wo Contrast  Result Date: 01/25/2017 CLINICAL DATA:  Recent lumbar spine surgery. Hypotension, low hematocrit. EXAM: CT ABDOMEN AND PELVIS WITHOUT CONTRAST TECHNIQUE: Multidetector CT imaging of the abdomen and pelvis was performed following the standard protocol without IV contrast. COMPARISON:  None. FINDINGS: Lower chest: Small bilateral pleural effusions with bilateral lower lobe airspace disease with air bronchograms. Cannot exclude pneumonia. Heart is normal size. Hepatobiliary: 3.0 cm low-density area in the right hepatic lobe, likely cyst although this cannot be fully characterized without intravenous contrast. Gallbladder unremarkable. Pancreas: No focal abnormality or ductal dilatation. Spleen: No focal abnormality.  Normal size. Adrenals/Urinary Tract: Numerous bilateral hypodense and hyperdense lesions within the kidneys bilaterally, likely a mixture of simple and complex/ hemorrhagic cysts. These cannot be fully characterized without intravenous contrast. No hydronephrosis. Adrenal glands and urinary bladder are unremarkable. Urinary bladder decompressed with Foley catheter in place. Stomach/Bowel: Stomach, large and small bowel grossly unremarkable. Vascular/Lymphatic: Diffuse aortic calcifications. Bilateral common iliac stents. No aneurysm or adenopathy. Reproductive: No visible focal abnormality. Other: No free fluid or free air. There is stranding noted within the left lateral abdominal/ pelvic wall and inguinal region, also extending into the left retroperitoneum along the psoas and iliopsoas muscles, likely small retroperitoneal hematoma. Rounded fluid collection is noted in the left groin, partially imaged on the lowest images measuring 4.5 x 3.8 cm. This is immediately adjacent to  what appears to be a left vascular graft in is of unknown chronicity. Musculoskeletal: Postoperative changes from anterior and posterior fusion in the mid to lower lumbar spine. IMPRESSION: Mild stranding in the left retroperitoneum extending down to the left inguinal region, likely small left retroperitoneal hematoma. No drainable fluid collection. Fluid collection in the left inguinal region adjacent to what appears to be a left femoral graft, measuring up to 4.5 cm. This  is of unknown chronicity. Small bilateral pleural effusions with bibasilar opacities and air bronchograms. Cannot exclude pneumonia. Electronically Signed   By: Rolm Baptise M.D.   On: 01/25/2017 10:16   Dg Chest 1 View  Result Date: 01/25/2017 CLINICAL DATA:  Shortness of breath. EXAM: CHEST 1 VIEW COMPARISON:  Radiograph of November 17, 2012. FINDINGS: Stable cardiomediastinal silhouette. No pneumothorax or pleural effusion is noted. Right lung is clear. New left basilar opacity is noted consistent with atelectasis. Old left clavicular fracture is noted. IMPRESSION: New left basilar opacity is noted most consistent with atelectasis, or possibly pneumonia. Endobronchial obstruction cannot be excluded. Followup PA and lateral chest X-ray is recommended in 3-4 weeks following trial of antibiotic therapy to ensure resolution and exclude underlying malignancy. Electronically Signed   By: Marijo Conception, M.D.   On: 01/25/2017 10:16   Dg Lumbar Spine 2-3 Views  Result Date: 01/22/2017 CLINICAL DATA:  Lumbar fusion EXAM: LUMBAR SPINE - 2-3 VIEW; DG C-ARM GT 120 MIN COMPARISON:  01/15/2017 FLUOROSCOPY TIME:  Fluoroscopy Time:  3 minutes 14 seconds Radiation Exposure Index (if provided by the fluoroscopic device): Un known Number of Acquired Spot Images: 2 FINDINGS: Pedicle screws are now seen at L3, L4, L5 and S1 with interbody fusion at all 3 levels. Posterior fixation is noted. Changes of prior iliac stents are seen. IMPRESSION: Lumbar fusion  from L3-S1. Electronically Signed   By: Inez Catalina M.D.   On: 01/22/2017 15:34   Ct Head Wo Contrast  Result Date: 01/26/2017 CLINICAL DATA:  Bradycardia. Altered mental status. Recent lumbar surgery EXAM: CT HEAD WITHOUT CONTRAST TECHNIQUE: Contiguous axial images were obtained from the base of the skull through the vertex without intravenous contrast. COMPARISON:  Head CT 11/17/2012 FINDINGS: Brain: No acute intracranial hemorrhage. No focal mass lesion. No CT evidence of acute infarction. No midline shift or mass effect. No hydrocephalus. Basilar cisterns are patent. Mild deep white matter microvascular disease. Vascular: No hyperdense vessel or unexpected calcification. Skull: Normal. Negative for fracture or focal lesion. Sinuses/Orbits: Paranasal sinuses and mastoid air cells are clear. Orbits are clear. Other: None. IMPRESSION: 1. No acute intracranial findings. 2. Mild white matter microvascular disease. Electronically Signed   By: Suzy Bouchard M.D.   On: 01/26/2017 17:29   Ct Chest Wo Contrast  Result Date: 01/26/2017 CLINICAL DATA:  Recent CPR and lumbar fusion EXAM: CT CHEST, ABDOMEN AND PELVIS WITHOUT CONTRAST TECHNIQUE: Multidetector CT imaging of the chest, abdomen and pelvis was performed following the standard protocol without IV contrast. COMPARISON:  01/25/2017 FINDINGS: CT CHEST FINDINGS Cardiovascular: Somewhat limited due to the lack of IV contrast. Diffuse aortic calcifications are noted. No aneurysmal dilatation is seen. No cardiac enlargement is noted. Coronary calcifications are seen. Mediastinum/Nodes: Thoracic inlet is within normal limits. Endotracheal tube and nasogastric catheter are noted in place. No significant hilar or mediastinal adenopathy is noted. Left jugular central line is noted. Lungs/Pleura: Bilateral pleural effusions are seen with lower lobe atelectatic changes. Patchy changes are noted in the upper lobe of also likely related to some atelectasis. No sizable  parenchymal nodule is noted. Mild emphysematous changes are seen. Musculoskeletal: Degenerative change of the thoracic spine is noted. No acute bony abnormality is seen. CT ABDOMEN PELVIS FINDINGS Hepatobiliary: Stable hepatic cyst is noted in the right lobe posteriorly. No gallstones, gallbladder wall thickening, or biliary dilatation. Pancreas: Unremarkable. No pancreatic ductal dilatation or surrounding inflammatory changes. Spleen: Normal in size without focal abnormality. Adrenals/Urinary Tract: Scattered cysts are noted within  the kidneys bilaterally. Some of these are hyperdense consistent with hemorrhagic cysts. These are stable in appearance from the prior exam. No renal calculi or obstructive changes are seen. The bladder is decompressed by Foley catheter. Stomach/Bowel: Nasogastric catheter extends into the second portion of the duodenum. Mild diverticular change of the colon is seen. No obstructive changes are noted. Vascular/Lymphatic: Aortic atherosclerosis. Bilateral iliac stents are noted. No enlarged abdominal or pelvic lymph nodes. Fluid collection is noted in the upper left thigh stable from the prior exam likely related to a postoperative fluid collection from previous vascular surgery. Reproductive: Prostate is unremarkable. Other: Some stranding is noted along the anterior aspect of the left psoas muscle extending towards the inguinal region. This is likely related to the recent surgery although no sizable hematoma is noted. Postsurgical changes along the left rectus muscle and anterior abdominal wall are seen consistent with the recent lumbar fusion. Musculoskeletal: Multilevel lumbar fusion is seen. IMPRESSION: Bilateral pleural effusions and lower lobe and right upper lobe infiltrative/atelectatic changes. These changes have increased in the interval from the prior exam. Stable stranding in the retroperitoneum on the left extending inferiorly. This is likely related to the recent lumbar  fusion. No sizable hematoma is seen. Stable changes in the kidneys bilaterally. Electronically Signed   By: Inez Catalina M.D.   On: 01/26/2017 17:38   Ct Lumbar Spine Wo Contrast  Result Date: 01/15/2017 CLINICAL DATA:  71 y/o M; ongoing lumbar spine pain for years, preoperative planning. EXAM: CT LUMBAR SPINE WITHOUT CONTRAST TECHNIQUE: Multidetector CT imaging of the lumbar spine was performed without intravenous contrast administration. Multiplanar CT image reconstructions were also generated. COMPARISON:  01/23/2016 CT myelogram.  10/04/2016 lumbar MRI. FINDINGS: Segmentation: 5 lumbar type vertebrae. Alignment: Mild S-shaped curvature of lumbar spine with rightward apex at L3 and leftward apex at L5. Vertebrae: No acute fracture or loss of vertebral body height. L5 right laminectomy changes. Paraspinal and other soft tissues: Multiple renal cysts bilaterally. Severe calcific atherosclerosis of the abdominal aorta. The infrarenal abdominal aorta measures up to 27 mm. Bilateral iliac stents in situ. Right common iliac artery aneurysm measuring 17 mm. Disc levels: Mild L2-3, severe L3-4, and moderate at L4 through S1 disc space narrowing. Multilevel facet arthrosis. Disc bulges and posterior marginal osteophytes result in mild-to-moderate left-sided foraminal narrowing from L3 through S1, moderate right-sided L4-5 foraminal narrowing, and severe right L5-S1 foraminal narrowing. No high-grade bony canal stenosis. IMPRESSION: 1. No acute osseous abnormality. 2. Mild S-shaped curvature of the lumbar spine. 3. Discogenic and facet degenerative changes with multilevel foraminal narrowing greatest at the right L5-S1 level where it is severe. No high-grade bony canal stenosis. 4. Severe aortic atherosclerosis. 5. Abdominal aortic ectasia measuring up to 27 mm. Ectatic abdominal aorta at risk for aneurysm development. Recommend followup by ultrasound in 5 years. This recommendation follows ACR consensus guidelines:  White Paper of the ACR Incidental Findings Committee II on Vascular Findings. J Am Coll Radiol 2013; 10:789-794. 6. Bilateral common iliac stents. Stable right common iliac artery aneurysm. Electronically Signed   By: Kristine Garbe M.D.   On: 01/15/2017 19:26   Dg Chest Port 1 View  Result Date: 01/26/2017 CLINICAL DATA:  Central line placement EXAM: PORTABLE CHEST 1 VIEW COMPARISON:  01/26/2017 FINDINGS: Endotracheal and NG tube unchanged. Introduction of LEFT central venous line with tip in the distal SVC. No pneumothorax. LEFT basilar atelectasis persist. Improved aeration RIGHT lung base. IMPRESSION: LEFT central venous line placed without pneumothorax. Tip in the distal  SVC. Improved aeration of RIGHT lung base. Persistent LEFT basilar atelectasis. Electronically Signed   By: Suzy Bouchard M.D.   On: 01/26/2017 17:02   Dg Chest Port 1 View  Result Date: 01/26/2017 CLINICAL DATA:  Status post intubation EXAM: PORTABLE CHEST 1 VIEW COMPARISON:  01/26/2017 FINDINGS: Cardiac shadow is stable. Endotracheal tube and nasogastric catheter are now seen in satisfactory position. Bilateral pleural effusions are noted with basilar atelectasis. The overall appearance is stable. IMPRESSION: Tubes and lines in satisfactory position. The overall appearance of the chest is stable from the recent exam. Electronically Signed   By: Inez Catalina M.D.   On: 01/26/2017 16:11   Dg Chest Port 1 View  Result Date: 01/26/2017 CLINICAL DATA:  Respiratory failure EXAM: PORTABLE CHEST 1 VIEW COMPARISON:  01/25/2017 FINDINGS: Cardiomegaly with vascular congestion. Low lung volumes with bibasilar atelectasis and possible small effusions. No acute bony abnormality. IMPRESSION: Cardiomegaly with vascular congestion. Bibasilar atelectasis and small effusions. Electronically Signed   By: Rolm Baptise M.D.   On: 01/26/2017 13:39   Dg Abd Portable 1v  Result Date: 01/26/2017 CLINICAL DATA:  Orogastric tube placement  EXAM: PORTABLE ABDOMEN - 1 VIEW COMPARISON:  None. FINDINGS: There is a nasogastric tube with the tip projecting over the antrum of the stomach. There is mild gaseous distension of the stomach. There is no bowel dilatation to suggest obstruction. There is no evidence of pneumoperitoneum, portal venous gas or pneumatosis. There are no pathologic calcifications along the expected course of the ureters. There is posterior lumbar interbody fusion hardware at L3, L4 and L5. IMPRESSION: Nasogastric tube with the tip projecting over the antrum of the stomach. Electronically Signed   By: Kathreen Devoid   On: 01/26/2017 16:10   Dg C-arm Gt 120 Min  Result Date: 01/22/2017 CLINICAL DATA:  Lumbar fusion EXAM: LUMBAR SPINE - 2-3 VIEW; DG C-ARM GT 120 MIN COMPARISON:  01/15/2017 FLUOROSCOPY TIME:  Fluoroscopy Time:  3 minutes 14 seconds Radiation Exposure Index (if provided by the fluoroscopic device): Un known Number of Acquired Spot Images: 2 FINDINGS: Pedicle screws are now seen at L3, L4, L5 and S1 with interbody fusion at all 3 levels. Posterior fixation is noted. Changes of prior iliac stents are seen. IMPRESSION: Lumbar fusion from L3-S1. Electronically Signed   By: Inez Catalina M.D.   On: 01/22/2017 15:34    ECG & Cardiac Imaging    See HPI  Assessment & Plan    74M with DM, HTN, HLD, carotid stenosis, PAD s/p L fem-pop bypass (08/2015, Dr. [name]), prior bilateral iliac stents,, COPD, dementia, CKD III/IV, who was admitted s/p anterior fusion (01/22/17, Dr. Cyndy Freeze) c/b hypotension, AMS requiring intubation, and bradycardic arrest today. Post arrest, he is hemodynamically stable and has remained in SB with stable lateral T wave changes.   Mr. Imbert has no known history of arrhythmias but has demonstrated sinus bradycardia on prior ECGs. He is on metoprolol at home but this has been held since 2/8. He has an extensive history of PVD but no known history of coronary disease (normal stress prior to L fem-pop).     Based on the circumstances surrounding the bradycardic event (movement was trigger) and the nature of the rhythm disturbance (progressive PP and RR prolongation prior to asystolic event with underlying non-conducted P waves at rate of 20-30bpm) with resumption of SB without administration of epinephrine, the episode is most likely related to extreme vagal tone rather than intrinsic conduction system disease.  This can be  observed at times in the setting of critical illness but also in the setting of inferior MI (not demonstrated by ECG).   At this point given the high probability of a vagal mechanism with only one occurrence of a single significant pause, a temporary pacemaker can reasonably deferred at this time, and a permanent device may not be necessary either, although continued observation will be necessary to make this determination.    1. Avoid AV nodal agents and other agents that could cause bradycardia (including propafol)  2. Atropine at bedside with pacing pads on.  3. If significant pause recurs, will discuss temporary pacemaker placement with on-call interventionalist 4. Cycle troponins 5. TTE in AM  Signed, Lamar Sprinkles, MD 01/26/2017, 6:07 PM

## 2017-01-26 NOTE — Progress Notes (Signed)
Called by eMD, lost IV access.  Went and placed L IJ TLC.  After placement and repositioning, patient became bradycardic and began desaturating.  Lung sounds were audible in the left hemothorax so chose not to needle decompress.  Subsequently began to drop BP by cuff.  Placed a-line without difficulty and BP was noted to be in the 100's.  CXR was called stat and after placement of the plate under patient he went into bradycardic arrest.  CPR started and ROSC established within 1 minutes.  CXR performed and no PTX was noted and line and ETT were in good positions.  ABG was done and showed a PaO2 of 61.  Cr of >3.  Decision was made to take patient to CT scan of the head, chest, abdomen and pelvis.  I went with patient and RNs to scanner.  Head was clean, aspiration pneumonia was a concern.  Unable to get contrast but PE is high on the list as well as ARDS or a MI.  Cardiology consult called, EKG being performed.  Wife notified and Dr. Cyndy Freeze notified and ok to place on a heparin drip in case this a MI or PE.  Additional CC time of 90 minutes.  Rush Farmer, M.D. [facility] Pulmonary/Critical Care Medicine. Pager: [phone]. After hours pager: [phone].

## 2017-01-26 NOTE — Progress Notes (Signed)
RT NTS suctioned pt due to desat. Pt did not react to the suctioning. Moderate, thick yellow/tan secretions.  Pt on NRB mask. Sat 98%.

## 2017-01-26 NOTE — Progress Notes (Signed)
Assessment/Recommendations  1. AKI on CKD3/4- ischemic AKI related to hypotension, ABLA, with ACE and NSAID on board. Follow  creat 2. Anemia- ABLA s/p PRBCs 3. HTN- now labile; hold meds 4. S/p LS fusion procedure, pedicle screw fixation, anterior approach with abdominal exposure - per neurosurgery (Dr. Cyndy Freeze) and vascular surgery (Dr. [name]) teams. Avoid further NSAID use. 5. PAD/CAD/? RAS-  6. Volume overload Furosemide again today for volume  Subjective: Interval History: Improved UOP`  Objective: Vital signs in last 24 hours: Temp:  [98.2 F (36.8 C)-99.4 F (37.4 C)] 98.4 F (36.9 C) (02/10 0334) Pulse Rate:  [55-85] 55 (02/10 0700) Resp:  [7-16] 7 (02/10 0700) BP: (79-125)/(40-113) 97/44 (02/10 0700) SpO2:  [89 %-98 %] 91 % (02/10 0700) FiO2 (%):  [50 %] 50 % (02/09 0830) Weight:  [109.9 kg (242 lb 4.6 oz)] 109.9 kg (242 lb 4.6 oz) (02/09 0820) Weight change:   Intake/Output from previous day: 02/09 0701 - 02/10 0700 In: 890 [P.O.:240; IV Piggyback:650] Out: 2150 [Urine:2150] Intake/Output this shift: No intake/output data recorded.  General appearance: slowed mentation Chest wall: no tenderness Cardio: regular rate and rhythm, S1, S2 normal, no murmur, click, rub or gallop GI: protuberant Extremities: edema 2+  Lungs rhonchi bilat  Lab Results:  Recent Labs  01/24/17 1836 01/25/17 0224  WBC 12.5* 10.7*  HGB 8.9* 8.3*  HCT 28.1* 26.2*  PLT 260 250   BMET:  Recent Labs  01/24/17 0901 01/25/17 0224  NA 136 134*  K 4.3 4.4  CL 103 104  CO2 22 19*  GLUCOSE 138* 104*  BUN 62* 68*  CREATININE 4.33* 4.64*  CALCIUM 7.4* 7.3*   No results for input(s): PTH in the last 72 hours. Iron Studies:  Recent Labs  01/25/17 0224  IRON 23*  TIBC 231*   Studies/Results: Ct Abdomen Pelvis Wo Contrast  Result Date: 01/25/2017 CLINICAL DATA:  Recent lumbar spine surgery. Hypotension, low hematocrit. EXAM: CT ABDOMEN AND PELVIS WITHOUT CONTRAST  TECHNIQUE: Multidetector CT imaging of the abdomen and pelvis was performed following the standard protocol without IV contrast. COMPARISON:  None. FINDINGS: Lower chest: Small bilateral pleural effusions with bilateral lower lobe airspace disease with air bronchograms. Cannot exclude pneumonia. Heart is normal size. Hepatobiliary: 3.0 cm low-density area in the right hepatic lobe, likely cyst although this cannot be fully characterized without intravenous contrast. Gallbladder unremarkable. Pancreas: No focal abnormality or ductal dilatation. Spleen: No focal abnormality.  Normal size. Adrenals/Urinary Tract: Numerous bilateral hypodense and hyperdense lesions within the kidneys bilaterally, likely a mixture of simple and complex/ hemorrhagic cysts. These cannot be fully characterized without intravenous contrast. No hydronephrosis. Adrenal glands and urinary bladder are unremarkable. Urinary bladder decompressed with Foley catheter in place. Stomach/Bowel: Stomach, large and small bowel grossly unremarkable. Vascular/Lymphatic: Diffuse aortic calcifications. Bilateral common iliac stents. No aneurysm or adenopathy. Reproductive: No visible focal abnormality. Other: No free fluid or free air. There is stranding noted within the left lateral abdominal/ pelvic wall and inguinal region, also extending into the left retroperitoneum along the psoas and iliopsoas muscles, likely small retroperitoneal hematoma. Rounded fluid collection is noted in the left groin, partially imaged on the lowest images measuring 4.5 x 3.8 cm. This is immediately adjacent to what appears to be a left vascular graft in is of unknown chronicity. Musculoskeletal: Postoperative changes from anterior and posterior fusion in the mid to lower lumbar spine. IMPRESSION: Mild stranding in the left retroperitoneum extending down to the left inguinal region, likely small left retroperitoneal hematoma.  No drainable fluid collection. Fluid collection in  the left inguinal region adjacent to what appears to be a left femoral graft, measuring up to 4.5 cm. This is of unknown chronicity. Small bilateral pleural effusions with bibasilar opacities and air bronchograms. Cannot exclude pneumonia. Electronically Signed   By: Rolm Baptise M.D.   On: 01/25/2017 10:16   Dg Chest 1 View  Result Date: 01/25/2017 CLINICAL DATA:  Shortness of breath. EXAM: CHEST 1 VIEW COMPARISON:  Radiograph of November 17, 2012. FINDINGS: Stable cardiomediastinal silhouette. No pneumothorax or pleural effusion is noted. Right lung is clear. New left basilar opacity is noted consistent with atelectasis. Old left clavicular fracture is noted. IMPRESSION: New left basilar opacity is noted most consistent with atelectasis, or possibly pneumonia. Endobronchial obstruction cannot be excluded. Followup PA and lateral chest X-ray is recommended in 3-4 weeks following trial of antibiotic therapy to ensure resolution and exclude underlying malignancy. Electronically Signed   By: Marijo Conception, M.D.   On: 01/25/2017 10:16    Scheduled: . acetaminophen  1,000 mg Oral Q6H  . amLODipine  10 mg Oral Daily  . aspirin EC  81 mg Oral Daily  . atorvastatin  80 mg Oral QHS  . docusate sodium  100 mg Oral BID  . febuxostat  80 mg Oral Daily  . fenofibrate  160 mg Oral Daily  . gabapentin  300 mg Oral TID  . hydrALAZINE  25 mg Oral BID  . insulin aspart  0-9 Units Subcutaneous TID WC  . mouth rinse  15 mL Mouth Rinse BID  . memantine  28 mg Oral Daily  . methocarbamol  750 mg Oral QID  . metoprolol succinate  50 mg Oral Daily  . piperacillin-tazobactam (ZOSYN)  IV  2.25 g Intravenous Q8H  . senna  1 tablet Oral BID  . sodium chloride flush  3 mL Intravenous Q12H    LOS: 4 days   Paisleigh Maroney C 01/26/2017,7:55 AM

## 2017-01-26 NOTE — Progress Notes (Signed)
This note also relates to the following rows which could not be included: SpO2 - Cannot attach notes to unvalidated device data  RT assisted in patient transport on vent to CT and returned to 3M08. [name] vitals remained consistent with his vitals before the trip.

## 2017-01-26 NOTE — Procedures (Signed)
Intubation Procedure Note Joseph Hebert [id number] 11-07-1946  Procedure: Intubation Indications: Respiratory insufficiency  Procedure Details Consent: Risks of procedure as well as the alternatives and risks of each were explained to the (patient/caregiver).  Consent for procedure obtained. Time Out: Verified patient identification, verified procedure, site/side was marked, verified correct patient position, special equipment/implants available, medications/allergies/relevent history reviewed, required imaging and test results available.  Performed  MAC and 3 Medications:  Fentanyl 100 mcg Etomidate 20 mgg Versed 2 mg NMB 80 mg rocuronium    Evaluation Hemodynamic Status: Transient hypotension treated with fluid; O2 sats: transiently fell during during procedure Patient's Current Condition: stable Complications: No apparent complications Patient did tolerate procedure well. Chest X-ray ordered to verify placement.  CXR: pending.   Richardson Landry Minor ACNP Maryanna Shape PCCM Pager [phone] till 3 pm If no answer page [phone] 01/26/2017, 2:25 PM  I was present and supervised procedure.  Rush Farmer, M.D. [facility] Pulmonary/Critical Care Medicine. Pager: [phone]. After hours pager: [phone].

## 2017-01-26 NOTE — Progress Notes (Signed)
OT Cancellation Note  Patient Details Name: KENY DONALD MRN: [id number] DOB: [birth year]   Cancelled Treatment:    Reason Eval/Treat Not Completed: Medical issues which prohibited therapy (pt with respiratory distress; pending intubation). Will follow up as time allows  Binnie Kand M.S., OTR/L Pager: [phone]  01/26/2017, 2:06 PM

## 2017-01-26 NOTE — Procedures (Signed)
Arterial Catheter Insertion Procedure Note Joseph Hebert [id number] 07-Feb-1946  Procedure: Insertion of Arterial Catheter  Indications: Blood pressure monitoring and Frequent blood sampling  Procedure Details Consent: Unable to obtain consent because of emergent medical necessity. Time Out: Verified patient identification, verified procedure, site/side was marked, verified correct patient position, special equipment/implants available, medications/allergies/relevent history reviewed, required imaging and test results available.  Performed  Maximum sterile technique was used including antiseptics, cap, gloves, gown, hand hygiene, mask and sheet. Skin prep: Chlorhexidine; local anesthetic administered 20 gauge catheter was inserted into left radial artery using the Seldinger technique.  Evaluation Blood flow good; BP tracing good. Complications: No apparent complications.   Joseph Hebert 01/26/2017

## 2017-01-26 NOTE — Progress Notes (Signed)
Vent changed made per Dr. Nelda Marseille.

## 2017-01-26 NOTE — Progress Notes (Signed)
Pt seen and examined. No issues overnight.  EXAM: Temp:  [97.4 F (36.3 C)-98.5 F (36.9 C)] 97.4 F (36.3 C) (02/10 0800) Pulse Rate:  [55-76] 56 (02/10 0800) Resp:  [7-16] 8 (02/10 0800) BP: (81-125)/(43-113) 85/68 (02/10 0800) SpO2:  [89 %-98 %] 95 % (02/10 0800) Intake/Output      02/09 0701 - 02/10 0700 02/10 0701 - 02/11 0700   P.O. 240    I.V. (mL/kg)     Blood     IV Piggyback 650    Total Intake(mL/kg) 890 (8.1)    Urine (mL/kg/hr) 2150 (0.8)    Total Output 2150     Net -1260           Awake and alert Follows commands throughout No focal weakness  No acute surgical issues Marginal blood pressure this morning Medical management per PCCM

## 2017-01-26 NOTE — Progress Notes (Signed)
eLink Physician-Brief Progress Note Patient Name: Joseph Hebert DOB: 1946/06/28 MRN: [id number]   Date of Service  01/26/2017  HPI/Events of Note  Notified patient has limited IV access. Peripheral IV patient now has is leaking. Very difficult stick. Recently intubated.   eICU Interventions  1. Intensivist to place central venous access 2. GI prophylaxis ordered with Protonix given renal failure      Intervention Category Intermediate Interventions: Other:  Tera Partridge 01/26/2017, 4:09 PM

## 2017-01-27 ENCOUNTER — Inpatient Hospital Stay (HOSPITAL_COMMUNITY): Payer: Medicare Other

## 2017-01-27 DIAGNOSIS — R0600 Dyspnea, unspecified: Secondary | ICD-10-CM

## 2017-01-27 LAB — BLOOD GAS, ARTERIAL
ACID-BASE DEFICIT: 6.4 mmol/L — AB (ref 0.0–2.0)
BICARBONATE: 18.2 mmol/L — AB (ref 20.0–28.0)
DRAWN BY: 441371
FIO2: 80
LHR: 24 {breaths}/min
O2 Saturation: 97.3 %
PEEP/CPAP: 14 cmH2O
PH ART: 7.351 (ref 7.350–7.450)
Patient temperature: 98.6
VT: 580 mL
pCO2 arterial: 33.8 mmHg (ref 32.0–48.0)
pO2, Arterial: 104 mmHg (ref 83.0–108.0)

## 2017-01-27 LAB — CBC WITH DIFFERENTIAL/PLATELET
BASOS ABS: 0 10*3/uL (ref 0.0–0.1)
BASOS PCT: 0 %
EOS ABS: 0.2 10*3/uL (ref 0.0–0.7)
Eosinophils Relative: 2 %
HCT: 22.8 % — ABNORMAL LOW (ref 39.0–52.0)
HEMOGLOBIN: 7.3 g/dL — AB (ref 13.0–17.0)
Lymphocytes Relative: 11 %
Lymphs Abs: 0.8 10*3/uL (ref 0.7–4.0)
MCH: 29.2 pg (ref 26.0–34.0)
MCHC: 32 g/dL (ref 30.0–36.0)
MCV: 91.2 fL (ref 78.0–100.0)
MONO ABS: 1.1 10*3/uL — AB (ref 0.1–1.0)
MONOS PCT: 15 %
NEUTROS PCT: 72 %
Neutro Abs: 5.3 10*3/uL (ref 1.7–7.7)
Platelets: 293 10*3/uL (ref 150–400)
RBC: 2.5 MIL/uL — ABNORMAL LOW (ref 4.22–5.81)
RDW: 16 % — AB (ref 11.5–15.5)
WBC: 7.4 10*3/uL (ref 4.0–10.5)

## 2017-01-27 LAB — GLUCOSE, CAPILLARY
GLUCOSE-CAPILLARY: 103 mg/dL — AB (ref 65–99)
GLUCOSE-CAPILLARY: 128 mg/dL — AB (ref 65–99)
GLUCOSE-CAPILLARY: 132 mg/dL — AB (ref 65–99)
GLUCOSE-CAPILLARY: 90 mg/dL (ref 65–99)
Glucose-Capillary: 94 mg/dL (ref 65–99)

## 2017-01-27 LAB — RENAL FUNCTION PANEL
ALBUMIN: 1.8 g/dL — AB (ref 3.5–5.0)
ANION GAP: 13 (ref 5–15)
BUN: 71 mg/dL — ABNORMAL HIGH (ref 6–20)
CALCIUM: 7.7 mg/dL — AB (ref 8.9–10.3)
CO2: 19 mmol/L — ABNORMAL LOW (ref 22–32)
Chloride: 106 mmol/L (ref 101–111)
Creatinine, Ser: 4.07 mg/dL — ABNORMAL HIGH (ref 0.61–1.24)
GFR calc Af Amer: 16 mL/min — ABNORMAL LOW (ref >60)
GFR, EST NON AFRICAN AMERICAN: 13 mL/min — AB (ref >60)
GLUCOSE: 82 mg/dL (ref 65–99)
PHOSPHORUS: 3.8 mg/dL (ref 2.5–4.6)
POTASSIUM: 4.1 mmol/L (ref 3.5–5.1)
SODIUM: 138 mmol/L (ref 135–145)

## 2017-01-27 LAB — ECHOCARDIOGRAM COMPLETE
Height: 69.5 in
Weight: 3950.64 oz

## 2017-01-27 LAB — HEPARIN LEVEL (UNFRACTIONATED): Heparin Unfractionated: 0.55 IU/mL (ref 0.30–0.70)

## 2017-01-27 LAB — MAGNESIUM: MAGNESIUM: 2 mg/dL (ref 1.7–2.4)

## 2017-01-27 LAB — VANCOMYCIN, RANDOM: Vancomycin Rm: 19

## 2017-01-27 LAB — TROPONIN I: Troponin I: 0.03 ng/mL (ref <0.03)

## 2017-01-27 MED ORDER — CHLORHEXIDINE GLUCONATE CLOTH 2 % EX PADS
6.0000 | MEDICATED_PAD | Freq: Every day | CUTANEOUS | Status: DC
  Administered 2017-01-27 – 2017-02-03 (×8): 6 via TOPICAL

## 2017-01-27 MED ORDER — FUROSEMIDE 10 MG/ML IJ SOLN
80.0000 mg | Freq: Once | INTRAMUSCULAR | Status: AC
  Administered 2017-01-27: 80 mg via INTRAVENOUS
  Filled 2017-01-27: qty 8

## 2017-01-27 MED ORDER — HEPARIN (PORCINE) IN NACL 100-0.45 UNIT/ML-% IJ SOLN
1300.0000 [IU]/h | INTRAMUSCULAR | Status: DC
  Administered 2017-01-27 – 2017-01-28 (×2): 1500 [IU]/h via INTRAVENOUS
  Filled 2017-01-27 (×3): qty 250

## 2017-01-27 MED ORDER — NEPRO/CARBSTEADY PO LIQD
1000.0000 mL | ORAL | Status: DC
Start: 2017-01-27 — End: 2017-01-28
  Administered 2017-01-27 – 2017-01-28 (×2): 1000 mL
  Filled 2017-01-27 (×3): qty 1000

## 2017-01-27 MED ORDER — VANCOMYCIN HCL 10 G IV SOLR
1500.0000 mg | INTRAVENOUS | Status: DC
  Administered 2017-01-27 – 2017-01-29 (×2): 1500 mg via INTRAVENOUS
  Filled 2017-01-27 (×2): qty 1500

## 2017-01-27 NOTE — Progress Notes (Signed)
Assessment/Recommendations  1. Nonoliguric AKI on CKD3/4- ischemic AKI, recurrent hypotension, ABLA, with ACE and NSAID on board.Followcreat 2. Anemia- ABLA s/p PRBCs 3. S/p LS fusion procedure, pedicle screw fixation, anterior approach with abdominal exposure - per neurosurgery (Dr. Cyndy Freeze) and vascular surgery (Dr. [name]) teams. 4. PAD/CAD/? RAS-  5. S/p bradycardia and resp arrest 6. Hypotension on IV heparin for ?PE  Subjective: Interval History: Intubated for resp distress and bradycardia  Objective: Vital signs in last 24 hours: Temp:  [97.9 F (36.6 C)-99.3 F (37.4 C)] 99.3 F (37.4 C) (02/11 1135) Pulse Rate:  [46-61] 59 (02/11 1400) Resp:  [16-24] 24 (02/11 1400) BP: (63-147)/(18-89) 83/61 (02/11 1400) SpO2:  [65 %-100 %] 96 % (02/11 1400) Arterial Line BP: (113-153)/(44-62) 143/60 (02/11 1400) FiO2 (%):  [70 %-100 %] 70 % (02/11 1204) Weight:  [112 kg (246 lb 14.6 oz)] 112 kg (246 lb 14.6 oz) (02/11 0500) Weight change: 2.1 kg (4 lb 10.1 oz)  Intake/Output from previous day: 02/10 0701 - 02/11 0700 In: 350.4 [I.V.:200.4; IV Piggyback:150] Out: 1125 [Urine:1125] Intake/Output this shift: Total I/O In: 174.3 [I.V.:99.3; NG/GT:75] Out: 100 [Urine:100]  General appearance: unresponsive Cardio: regular rate and rhythm, S1, S2 normal, no murmur, click, rub or gallop Extremities: edema 2+  Lab Results:  Recent Labs  01/26/17 1025 01/27/17 0536  WBC 8.5 7.4  HGB 8.8* 7.3*  HCT 28.1* 22.8*  PLT 262 293   BMET:  Recent Labs  01/26/17 1326 01/27/17 0500  NA 136 138  K 4.6 4.1  CL 109 106  CO2 19* 19*  GLUCOSE 114* 82  BUN 64* 71*  CREATININE 3.64* 4.07*  CALCIUM 8.1* 7.7*   No results for input(s): PTH in the last 72 hours. Iron Studies:  Recent Labs  01/25/17 0224  IRON 23*  TIBC 231*   Studies/Results: Ct Abdomen Pelvis Wo Contrast  Result Date: 01/26/2017 CLINICAL DATA:  Recent CPR and lumbar fusion EXAM: CT CHEST, ABDOMEN AND  PELVIS WITHOUT CONTRAST TECHNIQUE: Multidetector CT imaging of the chest, abdomen and pelvis was performed following the standard protocol without IV contrast. COMPARISON:  01/25/2017 FINDINGS: CT CHEST FINDINGS Cardiovascular: Somewhat limited due to the lack of IV contrast. Diffuse aortic calcifications are noted. No aneurysmal dilatation is seen. No cardiac enlargement is noted. Coronary calcifications are seen. Mediastinum/Nodes: Thoracic inlet is within normal limits. Endotracheal tube and nasogastric catheter are noted in place. No significant hilar or mediastinal adenopathy is noted. Left jugular central line is noted. Lungs/Pleura: Bilateral pleural effusions are seen with lower lobe atelectatic changes. Patchy changes are noted in the upper lobe of also likely related to some atelectasis. No sizable parenchymal nodule is noted. Mild emphysematous changes are seen. Musculoskeletal: Degenerative change of the thoracic spine is noted. No acute bony abnormality is seen. CT ABDOMEN PELVIS FINDINGS Hepatobiliary: Stable hepatic cyst is noted in the right lobe posteriorly. No gallstones, gallbladder wall thickening, or biliary dilatation. Pancreas: Unremarkable. No pancreatic ductal dilatation or surrounding inflammatory changes. Spleen: Normal in size without focal abnormality. Adrenals/Urinary Tract: Scattered cysts are noted within the kidneys bilaterally. Some of these are hyperdense consistent with hemorrhagic cysts. These are stable in appearance from the prior exam. No renal calculi or obstructive changes are seen. The bladder is decompressed by Foley catheter. Stomach/Bowel: Nasogastric catheter extends into the second portion of the duodenum. Mild diverticular change of the colon is seen. No obstructive changes are noted. Vascular/Lymphatic: Aortic atherosclerosis. Bilateral iliac stents are noted. No enlarged abdominal or pelvic lymph nodes.  Fluid collection is noted in the upper left thigh stable from  the prior exam likely related to a postoperative fluid collection from previous vascular surgery. Reproductive: Prostate is unremarkable. Other: Some stranding is noted along the anterior aspect of the left psoas muscle extending towards the inguinal region. This is likely related to the recent surgery although no sizable hematoma is noted. Postsurgical changes along the left rectus muscle and anterior abdominal wall are seen consistent with the recent lumbar fusion. Musculoskeletal: Multilevel lumbar fusion is seen. IMPRESSION: Bilateral pleural effusions and lower lobe and right upper lobe infiltrative/atelectatic changes. These changes have increased in the interval from the prior exam. Stable stranding in the retroperitoneum on the left extending inferiorly. This is likely related to the recent lumbar fusion. No sizable hematoma is seen. Stable changes in the kidneys bilaterally. Electronically Signed   By: Inez Catalina M.D.   On: 01/26/2017 17:38   Ct Head Wo Contrast  Result Date: 01/26/2017 CLINICAL DATA:  Bradycardia. Altered mental status. Recent lumbar surgery EXAM: CT HEAD WITHOUT CONTRAST TECHNIQUE: Contiguous axial images were obtained from the base of the skull through the vertex without intravenous contrast. COMPARISON:  Head CT 11/17/2012 FINDINGS: Brain: No acute intracranial hemorrhage. No focal mass lesion. No CT evidence of acute infarction. No midline shift or mass effect. No hydrocephalus. Basilar cisterns are patent. Mild deep white matter microvascular disease. Vascular: No hyperdense vessel or unexpected calcification. Skull: Normal. Negative for fracture or focal lesion. Sinuses/Orbits: Paranasal sinuses and mastoid air cells are clear. Orbits are clear. Other: None. IMPRESSION: 1. No acute intracranial findings. 2. Mild white matter microvascular disease. Electronically Signed   By: Suzy Bouchard M.D.   On: 01/26/2017 17:29   Ct Chest Wo Contrast  Result Date:  01/26/2017 CLINICAL DATA:  Recent CPR and lumbar fusion EXAM: CT CHEST, ABDOMEN AND PELVIS WITHOUT CONTRAST TECHNIQUE: Multidetector CT imaging of the chest, abdomen and pelvis was performed following the standard protocol without IV contrast. COMPARISON:  01/25/2017 FINDINGS: CT CHEST FINDINGS Cardiovascular: Somewhat limited due to the lack of IV contrast. Diffuse aortic calcifications are noted. No aneurysmal dilatation is seen. No cardiac enlargement is noted. Coronary calcifications are seen. Mediastinum/Nodes: Thoracic inlet is within normal limits. Endotracheal tube and nasogastric catheter are noted in place. No significant hilar or mediastinal adenopathy is noted. Left jugular central line is noted. Lungs/Pleura: Bilateral pleural effusions are seen with lower lobe atelectatic changes. Patchy changes are noted in the upper lobe of also likely related to some atelectasis. No sizable parenchymal nodule is noted. Mild emphysematous changes are seen. Musculoskeletal: Degenerative change of the thoracic spine is noted. No acute bony abnormality is seen. CT ABDOMEN PELVIS FINDINGS Hepatobiliary: Stable hepatic cyst is noted in the right lobe posteriorly. No gallstones, gallbladder wall thickening, or biliary dilatation. Pancreas: Unremarkable. No pancreatic ductal dilatation or surrounding inflammatory changes. Spleen: Normal in size without focal abnormality. Adrenals/Urinary Tract: Scattered cysts are noted within the kidneys bilaterally. Some of these are hyperdense consistent with hemorrhagic cysts. These are stable in appearance from the prior exam. No renal calculi or obstructive changes are seen. The bladder is decompressed by Foley catheter. Stomach/Bowel: Nasogastric catheter extends into the second portion of the duodenum. Mild diverticular change of the colon is seen. No obstructive changes are noted. Vascular/Lymphatic: Aortic atherosclerosis. Bilateral iliac stents are noted. No enlarged abdominal or  pelvic lymph nodes. Fluid collection is noted in the upper left thigh stable from the prior exam likely related to a postoperative fluid  collection from previous vascular surgery. Reproductive: Prostate is unremarkable. Other: Some stranding is noted along the anterior aspect of the left psoas muscle extending towards the inguinal region. This is likely related to the recent surgery although no sizable hematoma is noted. Postsurgical changes along the left rectus muscle and anterior abdominal wall are seen consistent with the recent lumbar fusion. Musculoskeletal: Multilevel lumbar fusion is seen. IMPRESSION: Bilateral pleural effusions and lower lobe and right upper lobe infiltrative/atelectatic changes. These changes have increased in the interval from the prior exam. Stable stranding in the retroperitoneum on the left extending inferiorly. This is likely related to the recent lumbar fusion. No sizable hematoma is seen. Stable changes in the kidneys bilaterally. Electronically Signed   By: Inez Catalina M.D.   On: 01/26/2017 17:38   Dg Chest Port 1 View  Result Date: 01/26/2017 CLINICAL DATA:  Central line placement EXAM: PORTABLE CHEST 1 VIEW COMPARISON:  01/26/2017 FINDINGS: Endotracheal and NG tube unchanged. Introduction of LEFT central venous line with tip in the distal SVC. No pneumothorax. LEFT basilar atelectasis persist. Improved aeration RIGHT lung base. IMPRESSION: LEFT central venous line placed without pneumothorax. Tip in the distal SVC. Improved aeration of RIGHT lung base. Persistent LEFT basilar atelectasis. Electronically Signed   By: Suzy Bouchard M.D.   On: 01/26/2017 17:02   Dg Chest Port 1 View  Result Date: 01/26/2017 CLINICAL DATA:  Status post intubation EXAM: PORTABLE CHEST 1 VIEW COMPARISON:  01/26/2017 FINDINGS: Cardiac shadow is stable. Endotracheal tube and nasogastric catheter are now seen in satisfactory position. Bilateral pleural effusions are noted with basilar  atelectasis. The overall appearance is stable. IMPRESSION: Tubes and lines in satisfactory position. The overall appearance of the chest is stable from the recent exam. Electronically Signed   By: Inez Catalina M.D.   On: 01/26/2017 16:11   Dg Chest Port 1 View  Result Date: 01/26/2017 CLINICAL DATA:  Respiratory failure EXAM: PORTABLE CHEST 1 VIEW COMPARISON:  01/25/2017 FINDINGS: Cardiomegaly with vascular congestion. Low lung volumes with bibasilar atelectasis and possible small effusions. No acute bony abnormality. IMPRESSION: Cardiomegaly with vascular congestion. Bibasilar atelectasis and small effusions. Electronically Signed   By: Rolm Baptise M.D.   On: 01/26/2017 13:39   Dg Abd Portable 1v  Result Date: 01/26/2017 CLINICAL DATA:  Orogastric tube placement EXAM: PORTABLE ABDOMEN - 1 VIEW COMPARISON:  None. FINDINGS: There is a nasogastric tube with the tip projecting over the antrum of the stomach. There is mild gaseous distension of the stomach. There is no bowel dilatation to suggest obstruction. There is no evidence of pneumoperitoneum, portal venous gas or pneumatosis. There are no pathologic calcifications along the expected course of the ureters. There is posterior lumbar interbody fusion hardware at L3, L4 and L5. IMPRESSION: Nasogastric tube with the tip projecting over the antrum of the stomach. Electronically Signed   By: Kathreen Devoid   On: 01/26/2017 16:10    Scheduled: . acetaminophen  1,000 mg Oral Q6H  . aspirin  81 mg Per Tube Daily  . atorvastatin  80 mg Per Tube QHS  . chlorhexidine gluconate (MEDLINE KIT)  15 mL Mouth Rinse BID  . Chlorhexidine Gluconate Cloth  6 each Topical Q0600  . docusate  100 mg Per Tube BID  . feeding supplement (NEPRO CARB STEADY)  1,000 mL Per Tube Q24H  . gabapentin  300 mg Per Tube Q8H  . insulin aspart  0-9 Units Subcutaneous TID WC  . mouth rinse  15 mL Mouth Rinse  BID  . mouth rinse  15 mL Mouth Rinse QID  . pantoprazole (PROTONIX) IV   40 mg Intravenous Q24H  . piperacillin-tazobactam (ZOSYN)  IV  2.25 g Intravenous Q8H  . sennosides  5 mL Per Tube BID  . sodium chloride flush  3 mL Intravenous Q12H  . vancomycin  1,500 mg Intravenous Q48H   Continuous: . sodium chloride 10 mL/hr at 01/26/17 2000  . sodium chloride    . fentaNYL infusion INTRAVENOUS 50 mcg/hr (01/27/17 1400)  . heparin 1,500 Units/hr (01/27/17 1400)  . phenylephrine (NEO-SYNEPHRINE) Adult infusion       LOS: 5 days   Malessa Zartman C 01/27/2017,3:03 PM

## 2017-01-27 NOTE — Progress Notes (Addendum)
ANTICOAGULATION CONSULT NOTE - Initial Consult  Pharmacy Consult for hepairn Indication: r/o PE  Allergies  Allergen Reactions  . No Known Allergies     Patient Measurements: Height: 5' 9.5" (176.5 cm) Weight: 246 lb 14.6 oz (112 kg) IBW/kg (Calculated) : 71.85 Heparin Dosing Weight: 95kg  Vital Signs: Temp: 98.6 F (37 C) (02/11 0800) Temp Source: Axillary (02/11 0800) BP: 92/42 (02/11 0800) Pulse Rate: 52 (02/11 0800)  Labs:  Recent Labs  01/25/17 0224 01/26/17 1025 01/26/17 1326 01/26/17 1821 01/26/17 2140 01/27/17 0500 01/27/17 0536  HGB 8.3* 8.8*  --   --   --   --  7.3*  HCT 26.2* 28.1*  --   --   --   --  22.8*  PLT 250 262  --   --   --   --  293  CREATININE 4.64*  --  3.64*  --   --  4.07*  --   TROPONINI  --   --   --  <0.03 0.03*  --  0.03*    Estimated Creatinine Clearance: 20.7 mL/min (by C-G formula based on SCr of 4.07 mg/dL (H)).   Medical History: Past Medical History:  Diagnosis Date  . Anemia   . Arrhythmia    takes metoprolol daily  . Arthritis   . Asthma   . CAD (coronary artery disease)   . Carotid artery occlusion    with Claudication  . Chronic back pain    stenosis  . Chronic kidney disease   . COPD (chronic obstructive pulmonary disease) (HCC)    Spirva daily and Albuterol as needed  . Dementia   . Depression   . Diabetes mellitus without complication (HCC)    Type 2 diet controlled.Never been on meds  . Gout    takes Uloric daily  . Head injury    as a child  . History of colon polyps    benign  . History of gastric ulcer   . History of hiatal hernia   . Hyperlipidemia    takes Atorvastatin and Fenofibrate daily  . Hypertension    takes Lisinopril,Amlodipine,and Hydralazine daily  . Impaired memory    takes Namenda daily  . Numbness    lower left leg and upper right thigh  . Pneumonia    hx of-couple of yrs ago  . Shortness of breath dyspnea    with exertion  . Tuberculosis 2006    9 months   . Vertigo      Medications:  Infusions:  . sodium chloride 10 mL/hr at 01/26/17 2000  . sodium chloride    . fentaNYL infusion INTRAVENOUS 50 mcg/hr (01/27/17 0505)  . heparin    . phenylephrine (NEO-SYNEPHRINE) Adult infusion      Assessment: 65 yom presented with hypoxia after recent lumbar fusion on 2/6. Required intubation 2/10 and now initiating IV heparin for r/o PE. Per discussion with Dr. Elsworth Soho, will not bolus due to recent surgery and low hemoglobin.   Goal of Therapy:  Heparin level 0.3-0.7 units/ml Monitor platelets by anticoagulation protocol: Yes   Plan:  Heparin drip 1500 units/hr Check an 8 hr heparin level Daily heparin level and CBC  Wilberto Console, Rande Lawman 01/27/2017,8:58 AM  Addendum: Initial heparin level is therapeutic at 0.55. No bleeding noted. Continue heparin gtt 1500 units/hr, confirm dosing with AM heparin level.  Salome Arnt, PharmD, BCPS Pager # [phone] 01/27/2017 7:55 PM

## 2017-01-27 NOTE — Progress Notes (Signed)
RT attempted to wean FIO2 to 60%. Patient's sat dropped to 93%. Sat goal per Dr. Elsworth Soho is 95% or better. Pt returned to 70%, sat improved to 95%.

## 2017-01-27 NOTE — Progress Notes (Signed)
Pharmacy Antibiotic Note  Joseph Hebert is a 71 y.o. male admitted on 01/22/2017 with pneumonia.  Pharmacy has been consulted for vancomycin and zosyn dosing. Pt is afebrile and WBC is WNL. SCr up to 4.07. A vancomycin level was checked today and is at goal at 19.   Plan: Vancomycin 1500mg  IV Q48H Continue zosyn 2.25gm IV Q8H F/u renal fxn, C&S, clinical status and trough at SS  Height: 5' 9.5" (176.5 cm) Weight: 246 lb 14.6 oz (112 kg) IBW/kg (Calculated) : 71.85  Temp (24hrs), Avg:98.4 F (36.9 C), Min:97.9 F (36.6 C), Max:99.3 F (37.4 C)   Recent Labs Lab 01/23/17 0338 01/24/17 0901 01/24/17 1836 01/25/17 0224 01/26/17 1025 01/26/17 1326 01/26/17 1609 01/27/17 0500 01/27/17 0536 01/27/17 1141  WBC 8.7 11.4* 12.5* 10.7* 8.5  --   --   --  7.4  --   CREATININE 3.08* 4.33*  --  4.64*  --  3.64*  --  4.07*  --   --   LATICACIDVEN  --   --   --   --   --   --  0.7  --   --   --   VANCORANDOM  --   --   --   --   --   --   --   --   --  19    Estimated Creatinine Clearance: 20.7 mL/min (by C-G formula based on SCr of 4.07 mg/dL (H)).    Allergies  Allergen Reactions  . No Known Allergies     Antimicrobials this admission: Zosyn 2/9>> Vancomycin 2/9>>  Dose adjustments this admission: 2/11 VR = 19  Microbiology results: 2/10 TA - few GNR 2/9 Blood - NGTD  Thank you for allowing pharmacy to be a part of this patient's care.  Garwood Wentzell, Rande Lawman 01/27/2017 1:41 PM

## 2017-01-27 NOTE — Progress Notes (Signed)
PULMONARY / CRITICAL CARE MEDICINE   Name: Joseph Hebert MRN: [id number] DOB: 20-Jul-1946    ADMISSION DATE:  01/22/2017 CONSULTATION DATE:  2/9  REFERRING MD:  NS  CHIEF COMPLAINT:  Hypoxia  HISTORY OF PRESENT ILLNESS:   71 yo male with refractory hypertension, PVD post left fem pop, CAD, Dyspnea on excerption who had anterior abd approach l3-5 fusion.  Early am 2/9 he was hypotensive and hypoxic and RRT was called to bedside. He was transferred to nsicu and promptly improved with stimulation   PMH - DM-2, PAD   STUDIES:  2/9 ct abd>>small rp bleed 2/10 CT chest >> BL effusions 2/10 CT abd/ pelvis >> stable 2/10 head CT >> neg  CULTURES: 2/9 bc x 2>> 2/0 uc>> 2/9 sputum>>  ANTIBIOTICS: 2/9 vanc>> 2/9 Zoysyn>>  SIGNIFICANT EVENTS: 2/6 lumbar fusion ant abd approach. 2/9 transferred to ICU with hypotension and hypoxia 2/10 intubated , brady arrest  LINES/TUBES: ETT 2/10 >> Lt [place] CVL 2/10 >>  SUBJECTIVE:  Sedated, intubated, critically ill On 70%/ PEEP 14  VITAL SIGNS: BP (!) 86/30   Pulse (!) 52   Temp 98.5 F (36.9 C) (Oral)   Resp (!) 24   Ht 5' 9.5" (1.765 m)   Wt 246 lb 14.6 oz (112 kg)   SpO2 98%   BMI 35.94 kg/m   HEMODYNAMICS: CVP:  [12 mmHg-16 mmHg] 16 mmHg  VENTILATOR SETTINGS: Vent Mode: PRVC FiO2 (%):  [80 %-100 %] 80 % Set Rate:  [16 bmp-24 bmp] 24 bmp Vt Set:  [580 mL] 580 mL PEEP:  [5 cmH20-14 cmH20] 14 cmH20 Plateau Pressure:  [17 cmH20-30 cmH20] 24 cmH20  INTAKE / OUTPUT: I/O last 3 completed shifts: In: 450.4 [I.V.:200.4; IV Piggyback:250] Out: 2200 [Urine:2200]  PHYSICAL EXAMINATION: General:  Acutely ill, obese, sedated , intubated Neuro:  Follows commands, RASS 0 HEENT:  No jvd/lan Cardiovascular:  HSR RRR Lungs:  Decreased bs bases, poor cough, poor pulmonary mechanics  Abdomen:  incision site unremarkable, mild distension, + bs Musculoskeletal:  Intact Skin:  Warm and dry  LABS:  BMET  Recent Labs Lab  01/25/17 0224 01/26/17 1326 01/27/17 0500  NA 134* 136 138  K 4.4 4.6 4.1  CL 104 109 106  CO2 19* 19* 19*  BUN 68* 64* 71*  CREATININE 4.64* 3.64* 4.07*  GLUCOSE 104* 114* 82    Electrolytes  Recent Labs Lab 01/25/17 0224 01/26/17 1326 01/26/17 1821 01/27/17 0500  CALCIUM 7.3* 8.1*  --  7.7*  MG  --   --  2.1  --   PHOS 4.4  --   --  3.8    CBC  Recent Labs Lab 01/25/17 0224 01/26/17 1025 01/27/17 0536  WBC 10.7* 8.5 7.4  HGB 8.3* 8.8* 7.3*  HCT 26.2* 28.1* 22.8*  PLT 250 262 293    Coag's  Recent Labs Lab 01/22/17 0610 01/23/17 0338  APTT 33 37*  INR 0.99 1.16    Sepsis Markers  Recent Labs Lab 01/26/17 1609  LATICACIDVEN 0.7    ABG  Recent Labs Lab 01/26/17 1332 01/26/17 1614 01/26/17 1656  PHART 7.301* 7.239* 7.337*  PCO2ART 38.3 46.1 34.4  PO2ART 56.0* 74.0* 59.0*    Liver Enzymes  Recent Labs Lab 01/25/17 0224 01/26/17 2140 01/27/17 0500  AST  --  71*  --   ALT  --  12*  --   ALKPHOS  --  61  --   BILITOT  --  0.8  --   ALBUMIN  2.4* 2.0* 1.8*    Cardiac Enzymes  Recent Labs Lab 01/26/17 1821 01/26/17 2140  TROPONINI <0.03 0.03*    Glucose  Recent Labs Lab 01/25/17 1704 01/25/17 2142 01/26/17 0743 01/26/17 1131 01/26/17 1725 01/26/17 2139  GLUCAP 116* 150* 124* 123* 114* 90    Imaging Ct Abdomen Pelvis Wo Contrast  Result Date: 01/26/2017 CLINICAL DATA:  Recent CPR and lumbar fusion EXAM: CT CHEST, ABDOMEN AND PELVIS WITHOUT CONTRAST TECHNIQUE: Multidetector CT imaging of the chest, abdomen and pelvis was performed following the standard protocol without IV contrast. COMPARISON:  01/25/2017 FINDINGS: CT CHEST FINDINGS Cardiovascular: Somewhat limited due to the lack of IV contrast. Diffuse aortic calcifications are noted. No aneurysmal dilatation is seen. No cardiac enlargement is noted. Coronary calcifications are seen. Mediastinum/Nodes: Thoracic inlet is within normal limits. Endotracheal tube and  nasogastric catheter are noted in place. No significant hilar or mediastinal adenopathy is noted. Left jugular central line is noted. Lungs/Pleura: Bilateral pleural effusions are seen with lower lobe atelectatic changes. Patchy changes are noted in the upper lobe of also likely related to some atelectasis. No sizable parenchymal nodule is noted. Mild emphysematous changes are seen. Musculoskeletal: Degenerative change of the thoracic spine is noted. No acute bony abnormality is seen. CT ABDOMEN PELVIS FINDINGS Hepatobiliary: Stable hepatic cyst is noted in the right lobe posteriorly. No gallstones, gallbladder wall thickening, or biliary dilatation. Pancreas: Unremarkable. No pancreatic ductal dilatation or surrounding inflammatory changes. Spleen: Normal in size without focal abnormality. Adrenals/Urinary Tract: Scattered cysts are noted within the kidneys bilaterally. Some of these are hyperdense consistent with hemorrhagic cysts. These are stable in appearance from the prior exam. No renal calculi or obstructive changes are seen. The bladder is decompressed by Foley catheter. Stomach/Bowel: Nasogastric catheter extends into the second portion of the duodenum. Mild diverticular change of the colon is seen. No obstructive changes are noted. Vascular/Lymphatic: Aortic atherosclerosis. Bilateral iliac stents are noted. No enlarged abdominal or pelvic lymph nodes. Fluid collection is noted in the upper left thigh stable from the prior exam likely related to a postoperative fluid collection from previous vascular surgery. Reproductive: Prostate is unremarkable. Other: Some stranding is noted along the anterior aspect of the left psoas muscle extending towards the inguinal region. This is likely related to the recent surgery although no sizable hematoma is noted. Postsurgical changes along the left rectus muscle and anterior abdominal wall are seen consistent with the recent lumbar fusion. Musculoskeletal: Multilevel  lumbar fusion is seen. IMPRESSION: Bilateral pleural effusions and lower lobe and right upper lobe infiltrative/atelectatic changes. These changes have increased in the interval from the prior exam. Stable stranding in the retroperitoneum on the left extending inferiorly. This is likely related to the recent lumbar fusion. No sizable hematoma is seen. Stable changes in the kidneys bilaterally. Electronically Signed   By: Inez Catalina M.D.   On: 01/26/2017 17:38   Ct Head Wo Contrast  Result Date: 01/26/2017 CLINICAL DATA:  Bradycardia. Altered mental status. Recent lumbar surgery EXAM: CT HEAD WITHOUT CONTRAST TECHNIQUE: Contiguous axial images were obtained from the base of the skull through the vertex without intravenous contrast. COMPARISON:  Head CT 11/17/2012 FINDINGS: Brain: No acute intracranial hemorrhage. No focal mass lesion. No CT evidence of acute infarction. No midline shift or mass effect. No hydrocephalus. Basilar cisterns are patent. Mild deep white matter microvascular disease. Vascular: No hyperdense vessel or unexpected calcification. Skull: Normal. Negative for fracture or focal lesion. Sinuses/Orbits: Paranasal sinuses and mastoid air cells are clear. Orbits  are clear. Other: None. IMPRESSION: 1. No acute intracranial findings. 2. Mild white matter microvascular disease. Electronically Signed   By: Suzy Bouchard M.D.   On: 01/26/2017 17:29   Ct Chest Wo Contrast  Result Date: 01/26/2017 CLINICAL DATA:  Recent CPR and lumbar fusion EXAM: CT CHEST, ABDOMEN AND PELVIS WITHOUT CONTRAST TECHNIQUE: Multidetector CT imaging of the chest, abdomen and pelvis was performed following the standard protocol without IV contrast. COMPARISON:  01/25/2017 FINDINGS: CT CHEST FINDINGS Cardiovascular: Somewhat limited due to the lack of IV contrast. Diffuse aortic calcifications are noted. No aneurysmal dilatation is seen. No cardiac enlargement is noted. Coronary calcifications are seen.  Mediastinum/Nodes: Thoracic inlet is within normal limits. Endotracheal tube and nasogastric catheter are noted in place. No significant hilar or mediastinal adenopathy is noted. Left jugular central line is noted. Lungs/Pleura: Bilateral pleural effusions are seen with lower lobe atelectatic changes. Patchy changes are noted in the upper lobe of also likely related to some atelectasis. No sizable parenchymal nodule is noted. Mild emphysematous changes are seen. Musculoskeletal: Degenerative change of the thoracic spine is noted. No acute bony abnormality is seen. CT ABDOMEN PELVIS FINDINGS Hepatobiliary: Stable hepatic cyst is noted in the right lobe posteriorly. No gallstones, gallbladder wall thickening, or biliary dilatation. Pancreas: Unremarkable. No pancreatic ductal dilatation or surrounding inflammatory changes. Spleen: Normal in size without focal abnormality. Adrenals/Urinary Tract: Scattered cysts are noted within the kidneys bilaterally. Some of these are hyperdense consistent with hemorrhagic cysts. These are stable in appearance from the prior exam. No renal calculi or obstructive changes are seen. The bladder is decompressed by Foley catheter. Stomach/Bowel: Nasogastric catheter extends into the second portion of the duodenum. Mild diverticular change of the colon is seen. No obstructive changes are noted. Vascular/Lymphatic: Aortic atherosclerosis. Bilateral iliac stents are noted. No enlarged abdominal or pelvic lymph nodes. Fluid collection is noted in the upper left thigh stable from the prior exam likely related to a postoperative fluid collection from previous vascular surgery. Reproductive: Prostate is unremarkable. Other: Some stranding is noted along the anterior aspect of the left psoas muscle extending towards the inguinal region. This is likely related to the recent surgery although no sizable hematoma is noted. Postsurgical changes along the left rectus muscle and anterior abdominal wall  are seen consistent with the recent lumbar fusion. Musculoskeletal: Multilevel lumbar fusion is seen. IMPRESSION: Bilateral pleural effusions and lower lobe and right upper lobe infiltrative/atelectatic changes. These changes have increased in the interval from the prior exam. Stable stranding in the retroperitoneum on the left extending inferiorly. This is likely related to the recent lumbar fusion. No sizable hematoma is seen. Stable changes in the kidneys bilaterally. Electronically Signed   By: Inez Catalina M.D.   On: 01/26/2017 17:38   Dg Chest Port 1 View  Result Date: 01/26/2017 CLINICAL DATA:  Central line placement EXAM: PORTABLE CHEST 1 VIEW COMPARISON:  01/26/2017 FINDINGS: Endotracheal and NG tube unchanged. Introduction of LEFT central venous line with tip in the distal SVC. No pneumothorax. LEFT basilar atelectasis persist. Improved aeration RIGHT lung base. IMPRESSION: LEFT central venous line placed without pneumothorax. Tip in the distal SVC. Improved aeration of RIGHT lung base. Persistent LEFT basilar atelectasis. Electronically Signed   By: Suzy Bouchard M.D.   On: 01/26/2017 17:02   Dg Chest Port 1 View  Result Date: 01/26/2017 CLINICAL DATA:  Status post intubation EXAM: PORTABLE CHEST 1 VIEW COMPARISON:  01/26/2017 FINDINGS: Cardiac shadow is stable. Endotracheal tube and nasogastric catheter are now  seen in satisfactory position. Bilateral pleural effusions are noted with basilar atelectasis. The overall appearance is stable. IMPRESSION: Tubes and lines in satisfactory position. The overall appearance of the chest is stable from the recent exam. Electronically Signed   By: Inez Catalina M.D.   On: 01/26/2017 16:11   Dg Chest Port 1 View  Result Date: 01/26/2017 CLINICAL DATA:  Respiratory failure EXAM: PORTABLE CHEST 1 VIEW COMPARISON:  01/25/2017 FINDINGS: Cardiomegaly with vascular congestion. Low lung volumes with bibasilar atelectasis and possible small effusions. No acute  bony abnormality. IMPRESSION: Cardiomegaly with vascular congestion. Bibasilar atelectasis and small effusions. Electronically Signed   By: Rolm Baptise M.D.   On: 01/26/2017 13:39   Dg Abd Portable 1v  Result Date: 01/26/2017 CLINICAL DATA:  Orogastric tube placement EXAM: PORTABLE ABDOMEN - 1 VIEW COMPARISON:  None. FINDINGS: There is a nasogastric tube with the tip projecting over the antrum of the stomach. There is mild gaseous distension of the stomach. There is no bowel dilatation to suggest obstruction. There is no evidence of pneumoperitoneum, portal venous gas or pneumatosis. There are no pathologic calcifications along the expected course of the ureters. There is posterior lumbar interbody fusion hardware at L3, L4 and L5. IMPRESSION: Nasogastric tube with the tip projecting over the antrum of the stomach. Electronically Signed   By: Kathreen Devoid   On: 01/26/2017 16:10       DISCUSSION: 71 yo male with refractory hypertension, PVD post left fem pop, CAD, Dyspnea on excerption who had anterior abd approach l3-5 fusion. Early am 2/9 he was hypotensive and hypoxic and RRT was called to bedside. He was transferred to nsicu and promptly improved with stimulation. He was intubated 2/10 & sustained a brady arrest Extreme hypoxia out of proportion to infx - PE remains a possibility   ASSESSMENT / PLAN:  PULMONARY A: Acute hypoxic resp failure - ARDS range hypoxia ? PE P:   Duplex BLEs , start IV heparin (d/w NS) Cannot give contrast   CARDIOVASCULAR A:  Hypotension in setting of post op lumbar surgery on multiple antihypertensives P:  Hold antihypertensives till stable  RENAL Lab Results  Component Value Date   CREATININE 4.07 (H) 01/27/2017   CREATININE 3.64 (H) 01/26/2017   CREATININE 4.64 (H) 01/25/2017    A:   CKD base 3.0 P:   Lasix 80 x 1  GASTROINTESTINAL A:   Abd distension -resolved CT abd mild rp hematoma P:   Start trickle TFs  HEMATOLOGIC  Recent  Labs  01/26/17 1025 01/27/17 0536  HGB 8.8* 7.3*    A:   Transfused per NS for Hgb 7.8 P:  Follow cbc on heparin  INFECTIOUS A:    Aspiration LLL - treat as HCAP  P:   2/9 start V/Z Await cx data  ENDOCRINE A:   DM P:   Monitor labs  NEUROLOGIC A:   Post 2/6 ant approach L3-5 fusion Was on OxyContin 20 mg bid P:   RASS goal: 0 Fent gtt    FAMILY  Wife 2/11  - Inter-disciplinary family meet or Palliative Care meeting due by:  day 7  The patient is critically ill with multiple organ systems failure and requires high complexity decision making for assessment and support, frequent evaluation and titration of therapies, application of advanced monitoring technologies and extensive interpretation of multiple databases. Critical Care Time devoted to patient care services described in this note independent of APP time is 35 minutes.    Kara Mead MD.  FCCP. [facility] Pager [phone] If no response call [phone]     01/27/2017, 7:39 AM

## 2017-01-27 NOTE — Progress Notes (Signed)
CRITICAL VALUE ALERT  Critical value received:  Troponin I 0.03  Date of notification:  11/25/2017     Time of notification:  2358  Critical value read back:Yes.    Nurse who received alert:  Justin Mend, RN  MD notified (1st page):  0025  Time of first page:  0025  MD notified (2nd page):  Time of second page:  Responding MD:  CCM MD  Time MD responded:  [phone]

## 2017-01-27 NOTE — Progress Notes (Signed)
Pt seen and examined. Required intubation yesterday.  Brief PEA arrest.  EXAM: Temp:  [97.9 F (36.6 C)-98.6 F (37 C)] 98.6 F (37 C) (02/11 0800) Pulse Rate:  [46-65] 52 (02/11 0800) Resp:  [8-24] 23 (02/11 0800) BP: (63-126)/(18-89) 92/42 (02/11 0800) SpO2:  [65 %-100 %] 99 % (02/11 0800) Arterial Line BP: (113-150)/(44-55) 148/55 (02/11 0800) FiO2 (%):  [80 %-100 %] 80 % (02/11 0757) Weight:  [112 kg (246 lb 14.6 oz)] 112 kg (246 lb 14.6 oz) (02/11 0500) Intake/Output      02/10 0701 - 02/11 0700 02/11 0701 - 02/12 0700   P.O.     I.V. (mL/kg) 200.4 (1.8)    IV Piggyback 150    Total Intake(mL/kg) 350.4 (3.1)    Urine (mL/kg/hr) 1125 (0.4)    Total Output 1125     Net -774.6            Intubated Awake and alert Follows commands throughout Full strength  Looking better today Continue current care per [facility] for anticoagulation

## 2017-01-27 NOTE — Progress Notes (Signed)
  Echocardiogram 2D Echocardiogram has been performed.  Donata Clay 01/27/2017, 11:29 AM

## 2017-01-27 NOTE — Progress Notes (Signed)
   Progress Note  Reviewed cardiology fellow consultation from yesterday evening. Heart rate and rhythm remain stable, sinus bradycardia in the 50s. Agree with recommendations made in the consultation note. Follow-up echocardiogram report.  Signed, Rozann Lesches, MD  01/27/2017, 12:51 PM

## 2017-01-27 NOTE — Progress Notes (Signed)
RT attempted to wean FIO2 to 60% per Dr. Bari Mantis request, but the patient's sat dropped below 92%. Pt returned to 70%, sat recovered to 96%.

## 2017-01-28 ENCOUNTER — Inpatient Hospital Stay (HOSPITAL_COMMUNITY): Payer: Medicare Other

## 2017-01-28 DIAGNOSIS — J189 Pneumonia, unspecified organism: Secondary | ICD-10-CM

## 2017-01-28 DIAGNOSIS — M7989 Other specified soft tissue disorders: Secondary | ICD-10-CM

## 2017-01-28 DIAGNOSIS — J9601 Acute respiratory failure with hypoxia: Secondary | ICD-10-CM

## 2017-01-28 LAB — CBC
HCT: 21.9 % — ABNORMAL LOW (ref 39.0–52.0)
HEMATOCRIT: 22.5 % — AB (ref 39.0–52.0)
HEMOGLOBIN: 7.2 g/dL — AB (ref 13.0–17.0)
Hemoglobin: 7 g/dL — ABNORMAL LOW (ref 13.0–17.0)
MCH: 29 pg (ref 26.0–34.0)
MCH: 29 pg (ref 26.0–34.0)
MCHC: 32 g/dL (ref 30.0–36.0)
MCHC: 32 g/dL (ref 30.0–36.0)
MCV: 90.7 fL (ref 78.0–100.0)
MCV: 90.9 fL (ref 78.0–100.0)
Platelets: 347 10*3/uL (ref 150–400)
Platelets: 315 10*3/uL (ref 150–400)
RBC: 2.48 MIL/uL — AB (ref 4.22–5.81)
RBC: 2.41 MIL/uL — ABNORMAL LOW (ref 4.22–5.81)
RDW: 16.2 % — ABNORMAL HIGH (ref 11.5–15.5)
RDW: 16.3 % — ABNORMAL HIGH (ref 11.5–15.5)
WBC: 9.1 10*3/uL (ref 4.0–10.5)
WBC: 9.5 10*3/uL (ref 4.0–10.5)

## 2017-01-28 LAB — TYPE AND SCREEN
ABO/RH(D): O POS
Antibody Screen: NEGATIVE
UNIT DIVISION: 0
UNIT DIVISION: 0
Unit division: 0
Unit division: 0

## 2017-01-28 LAB — GLUCOSE, CAPILLARY
GLUCOSE-CAPILLARY: 126 mg/dL — AB (ref 65–99)
GLUCOSE-CAPILLARY: 131 mg/dL — AB (ref 65–99)
GLUCOSE-CAPILLARY: 131 mg/dL — AB (ref 65–99)
GLUCOSE-CAPILLARY: 133 mg/dL — AB (ref 65–99)
Glucose-Capillary: 123 mg/dL — ABNORMAL HIGH (ref 65–99)
Glucose-Capillary: 119 mg/dL — ABNORMAL HIGH (ref 65–99)

## 2017-01-28 LAB — RENAL FUNCTION PANEL
Albumin: 1.8 g/dL — ABNORMAL LOW (ref 3.5–5.0)
Anion gap: 11 (ref 5–15)
BUN: 85 mg/dL — AB (ref 6–20)
CALCIUM: 7.9 mg/dL — AB (ref 8.9–10.3)
CHLORIDE: 107 mmol/L (ref 101–111)
CO2: 18 mmol/L — AB (ref 22–32)
CREATININE: 4.84 mg/dL — AB (ref 0.61–1.24)
GFR calc Af Amer: 13 mL/min — ABNORMAL LOW (ref >60)
GFR calc non Af Amer: 11 mL/min — ABNORMAL LOW (ref >60)
GLUCOSE: 132 mg/dL — AB (ref 65–99)
Phosphorus: 4.3 mg/dL (ref 2.5–4.6)
Potassium: 4.1 mmol/L (ref 3.5–5.1)
SODIUM: 136 mmol/L (ref 135–145)

## 2017-01-28 LAB — HEPARIN LEVEL (UNFRACTIONATED)
HEPARIN UNFRACTIONATED: 0.51 [IU]/mL (ref 0.30–0.70)
Heparin Unfractionated: 0.86 IU/mL — ABNORMAL HIGH (ref 0.30–0.70)

## 2017-01-28 MED ORDER — CEFTRIAXONE SODIUM 2 G IJ SOLR
2.0000 g | INTRAMUSCULAR | Status: DC
Start: 2017-01-28 — End: 2017-02-07
  Administered 2017-01-28 – 2017-02-06 (×10): 2 g via INTRAVENOUS
  Filled 2017-01-28 (×13): qty 2

## 2017-01-28 MED ORDER — PRO-STAT SUGAR FREE PO LIQD
60.0000 mL | Freq: Four times a day (QID) | ORAL | Status: DC
  Administered 2017-01-28 – 2017-02-02 (×21): 60 mL
  Filled 2017-01-28 (×20): qty 60

## 2017-01-28 MED ORDER — NEPRO/CARBSTEADY PO LIQD
1000.0000 mL | ORAL | Status: DC
  Administered 2017-01-28 – 2017-02-01 (×5): 1000 mL
  Filled 2017-01-28 (×8): qty 1000

## 2017-01-28 MED ORDER — NEPRO/CARBSTEADY PO LIQD
1000.0000 mL | ORAL | Status: DC
  Filled 2017-01-28: qty 1000

## 2017-01-28 MED ORDER — ORAL CARE MOUTH RINSE
15.0000 mL | OROMUCOSAL | Status: DC
  Administered 2017-01-28 – 2017-02-02 (×52): 15 mL via OROMUCOSAL

## 2017-01-28 MED ORDER — HEPARIN SODIUM (PORCINE) 5000 UNIT/ML IJ SOLN
5000.0000 [IU] | Freq: Three times a day (TID) | INTRAMUSCULAR | Status: DC
  Administered 2017-01-28 – 2017-02-08 (×32): 5000 [IU] via SUBCUTANEOUS
  Filled 2017-01-28 (×33): qty 1

## 2017-01-28 NOTE — Clinical Social Work Placement (Signed)
   CLINICAL SOCIAL WORK PLACEMENT  NOTE  Date:  01/28/2017  Patient Details  Name: Joseph Hebert MRN: [id number] Date of Birth: [birth year]  Clinical Social Work is seeking post-discharge placement for this patient at the [place] level of care (*CSW will initial, date and re-position this form in  chart as items are completed):  Yes   Patient/family provided with [facility] Work Department's list of facilities offering this level of care within the geographic area requested by the patient (or if unable, by the patient's family).  Yes   Patient/family informed of their freedom to choose among providers that offer the needed level of care, that participate in Medicare, Medicaid or managed care program needed by the patient, have an available bed and are willing to accept the patient.  Yes   Patient/family informed of Home's ownership interest in [facility] and [facility], as well as of the fact that they are under no obligation to receive care at these facilities.  PASRR submitted to EDS on 01/28/17     PASRR number received on 01/28/17     Existing PASRR number confirmed on       FL2 transmitted to all facilities in geographic area requested by pt/family on 01/28/17     FL2 transmitted to all facilities within larger geographic area on       Patient informed that his/her managed care company has contracts with or will negotiate with certain facilities, including the following:            Patient/family informed of bed offers received.  Patient chooses bed at       Physician recommends and patient chooses bed at      Patient to be transferred to   on  .  Patient to be transferred to facility by       Patient family notified on   of transfer.  Name of family member notified:        PHYSICIAN Please sign FL2     Additional Comment:    _______________________________________________ Serafina Mitchell, LCSWA 01/28/2017,  12:36 PM

## 2017-01-28 NOTE — Progress Notes (Signed)
eLink Physician-Brief Progress Note Patient Name: Joseph Hebert DOB: 26-Aug-1946 MRN: [id number]   Date of Service  01/28/2017  HPI/Events of Note  Duplex neg RVSP 54 on echo  eICU Interventions  Dc IV heparin SQ heparin      Intervention Category Intermediate Interventions: Diagnostic test evaluation  Milan Perkins V. 01/28/2017, 5:38 PM

## 2017-01-28 NOTE — NC FL2 (Signed)
[place] LEVEL OF CARE SCREENING TOOL     IDENTIFICATION  Patient Name: Joseph Hebert Birthdate: 10/14/1946 Sex: male Admission Date (Current Location): 01/22/2017  [facility] and [place] Number:  Herbalist and Address:  [facility] [place], [place] [address] [place], [place] [address]      Provider Number: [id number]  Attending Physician Name and Address:  [name], MD  Relative Name and Phone Number:  Spouse [phone]    Current Level of Care: Hospital Recommended Level of Care: [place] Prior Approval Number:    Date Approved/Denied:   PASRR Number: [id number]  Discharge Plan: SNF    Current Diagnoses: Patient Active Problem List   Diagnosis Date Noted  . Acute respiratory failure with hypoxia ([place])   . Cardiac asystole ([place])   . Cardiogenic shock ([place])   . Acute encephalopathy   . Lumbosacral spondylosis with radiculopathy 02/17/2016  . Atheroscler native arteries the extremities w/intermit claudication ([place]) 08/23/2015  . Chronic kidney disease (CKD), stage III (moderate) 07/18/2015  . Encounter for preprocedural cardiovascular examination 07/17/2015  . Bilateral leg pain 06/16/2015  . History of surgical procedure 12/08/2014  . Exudative retinal detachment 12/08/2014  . Choroidal nevus 12/08/2014  . Aftercare following surgery of the circulatory system, [place] 01/01/2014  . Occlusion and stenosis of carotid artery without mention of cerebral infarction 01/01/2013    Orientation RESPIRATION BLADDER Height & Weight     Self  Normal Continent Weight: 249 lb 5.4 oz (113.1 kg) Height:  5' 9.5" (176.5 cm)  BEHAVIORAL SYMPTOMS/MOOD NEUROLOGICAL BOWEL NUTRITION STATUS      Continent    AMBULATORY STATUS COMMUNICATION OF NEEDS Skin   Limited Assist Verbally Normal                       Personal Care Assistance Level of Assistance  Bathing, Feeding, Dressing Bathing Assistance: Limited  assistance Feeding assistance: Limited assistance Dressing Assistance: Limited assistance     Functional Limitations Info  Sight, Hearing, Speech Sight Info: Adequate Hearing Info: Adequate Speech Info: Adequate    SPECIAL CARE FACTORS FREQUENCY  PT (By licensed PT), OT (By licensed OT)     PT Frequency: 5x wk OT Frequency: 5x wk            Contractures Contractures Info: Not present    Additional Factors Info  Code Status, Allergies Code Status Info: Full Allergies Info: No Known Allergies           Current Medications (01/28/2017):  This is the current hospital active medication list Current Facility-Administered Medications  Medication Dose Route Frequency Provider Last Rate Last Dose  . 0.9 %  sodium chloride infusion   Intravenous Continuous Jamal Maes, MD 10 mL/hr at 01/26/17 2000    . 0.9 %  sodium chloride infusion  250 mL Intravenous Continuous Vincent J Costella, PA-C      . acetaminophen (TYLENOL) tablet 1,000 mg  1,000 mg Oral Q6H Vincent J Costella, PA-C   1,000 mg at 01/28/17 1229  . aspirin chewable tablet 81 mg  81 mg Per Tube Daily Javier Glazier, MD   81 mg at 01/28/17 1027  . atorvastatin (LIPITOR) tablet 80 mg  80 mg Per Tube QHS Javier Glazier, MD   80 mg at 01/27/17 2300  . chlorhexidine gluconate (MEDLINE KIT) (PERIDEX) 0.12 % solution 15 mL  15 mL Mouth Rinse BID Rush Farmer, MD  15 mL at 01/28/17 0800  . Chlorhexidine Gluconate Cloth 2 % PADS 6 each  6 each Topical Q0600 Rush Farmer, MD   6 each at 01/28/17 0600  . docusate (COLACE) 50 MG/5ML liquid 100 mg  100 mg Per Tube BID Javier Glazier, MD   100 mg at 01/27/17 2300  . feeding supplement (NEPRO CARB STEADY) liquid 1,000 mL  1,000 mL Per Tube Q24H Kara Mead V, MD 30 mL/hr at 01/28/17 1100 1,000 mL at 01/28/17 1100  . fentaNYL (SUBLIMAZE) 2,500 mcg in sodium chloride 0.9 % 250 mL (10 mcg/mL) infusion  25-400 mcg/hr Intravenous Continuous Rush Farmer, MD 7.5 mL/hr at  01/28/17 1120 75 mcg/hr at 01/28/17 1120  . fentaNYL (SUBLIMAZE) bolus via infusion 25 mcg  25 mcg Intravenous Q1H PRN Rush Farmer, MD   25 mcg at 01/26/17 1900  . gabapentin (NEURONTIN) 250 MG/5ML solution 300 mg  300 mg Per Tube Q8H Javier Glazier, MD   300 mg at 01/28/17 0509  . heparin ADULT infusion 100 units/mL (25000 units/265m sodium chloride 0.45%)  1,300 Units/hr Intravenous Continuous CFranky Macho RPH 13 mL/hr at 01/28/17 1100 1,300 Units/hr at 01/28/17 1100  . insulin aspart (novoLOG) injection 0-9 Units  0-9 Units Subcutaneous TID WC VTraci Sermon PA-C   1 Units at 01/28/17 06440 . MEDLINE mouth rinse  15 mL Mouth Rinse 10 times per day BKevan NyDitty, MD   15 mL at 01/28/17 1027  . midazolam (VERSED) injection 2-4 mg  2-4 mg Intravenous Q2H PRN WRush Farmer MD   2 mg at 01/26/17 2317  . ondansetron (ZOFRAN) injection 4 mg  4 mg Intravenous Q4H PRN VTraci Sermon PA-C   4 mg at 01/23/17 1837  . pantoprazole (PROTONIX) injection 40 mg  40 mg Intravenous Q24H JJavier Glazier MD   40 mg at 01/27/17 1813  . phenylephrine (NEO-SYNEPHRINE) 10 mg in sodium chloride 0.9 % 250 mL (0.04 mg/mL) infusion  0-400 mcg/min Intravenous Titrated WGrace BushyMinor, NP      . piperacillin-tazobactam (ZOSYN) IVPB 2.25 g  2.25 g Intravenous Q8H MWynell Balloon RPH   2.25 g at 01/28/17 1229  . sennosides (SENOKOT) 8.8 MG/5ML syrup 5 mL  5 mL Per Tube BID JJavier Glazier MD   5 mL at 01/27/17 2300  . sodium chloride flush (NS) 0.9 % injection 3 mL  3 mL Intravenous Q12H Vincent J Costella, PA-C   30 mL at 01/28/17 1028  . sodium chloride flush (NS) 0.9 % injection 3 mL  3 mL Intravenous PRN Vincent J Costella, PA-C      . vancomycin (VANCOCIN) 1,500 mg in sodium chloride 0.9 % 500 mL IVPB  1,500 mg Intravenous Q48H Rachel L Rumbarger, RPH   1,500 mg at 01/27/17 1550     Discharge Medications: Please see discharge summary for a list of discharge medications.  Relevant  Imaging Results:  Relevant Lab Results:   Additional Information [place] Shantia Sanford B, LCSWA

## 2017-01-28 NOTE — Care Management Note (Signed)
Case Management Note  Patient Details  Name: Joseph Hebert MRN: [id number] Date of Birth: [birth year]  Subjective/Objective: Pt admitted on 01/22/17 for anterior abdominal approach L3-5 fusion.  He developed hypotension and hypoxia on 2/9 and was transferred to [facility].  PTA, pt independent, lives with wife, [facility].                     Action/Plan: Pt remains intubated currently.  PT/OT recommending SNF, and CSW following.  Will follow for discharge planning as pt progresses.    Expected Discharge Date:  Expected Discharge Plan:  Skilled Nursing Facility  In-House Referral:  Clinical Social Work  Discharge planning Services  CM Consult  Post Acute Care Choice:    Choice offered to:     DME Arranged:    DME Agency:     HH Arranged:    [place] Agency:     Status of Service:  In process, will continue to follow  If discussed at Long Length of Stay Meetings, dates discussed:    Additional Comments:  Reinaldo Raddle, RN, BSN  Trauma/Neuro ICU Case Manager [phone]

## 2017-01-28 NOTE — Clinical Social Work Note (Signed)
Clinical Social Work Assessment  Patient Details  Name: Joseph Hebert MRN: [id number] Date of Birth: Apr 23, 1946  Date of referral:  01/28/17               Reason for consult:  Trauma, Facility Placement, Discharge Planning                Permission sought to share information with:  Family Supports Permission granted to share information::  Yes, Verbal Permission Granted  Name::     Wife  Agency::     Relationship::     Contact Information:     Housing/Transportation Living arrangements for the past 2 months:  Single Family Home Source of Information:  Spouse Patient Interpreter Needed:  None Criminal Activity/Legal Involvement Pertinent to Current Situation/Hospitalization:  No - Comment as needed Significant Relationships:  Adult Children, Spouse Lives with:  Spouse Do you feel safe going back to the place where you live?  Yes Need for family participation in patient care:  Yes (Comment)  Care giving concerns:  Prior to hospitalization pt was living with spouse in a single family home with 1 step to enter the home. Per wife pt was in so much pain (Back) he hardly left the home. Pt/Wife have adult children who reside in other states, Wife will be the primary caregiver post DC and is agreeable to pt going to rehab to gain strength and mobility.   Social Worker assessment / plan:  CSW attempted to meet with wife @ bedside, per nurse she has was present AM, not present in room when CSW presented. CSW contacted spouse via phone. Spouse reported that prior to this hospitalization and pt having back surgery pt was homebound and hardly left there home due to severe back pain. Pt has not led an active life this past year. Pt and wife reside in a single family home with 1 step to enter the home. Spouse reports that she will be the primary caretaker for pt post DC, all adult children live in other states and feels rehab will be beneficial. Per wife [facility] is the preferred SNF. CSW will coordinate  SNF placement once pt is medically stable.   Employment status:  Retired Nurse, adult PT Recommendations:  [place] / Referral to community resources:  [place]  Patient/Family's Response to care:  Spouse very appreciative of care pt is receiving and CSW's involvement with DC planning.  Patient/Family's Understanding of and Emotional Response to Diagnosis, Current Treatment, and Prognosis:  Spouse knowledgeable of pt's current condition and the risk pt took with back surgery. Spouse optimistic pt will get back to his baseline a year ago.  Emotional Assessment Appearance:  Appears stated age Attitude/Demeanor/Rapport:  Unable to Assess Affect (typically observed):  Unable to Assess Orientation:  Oriented to Self Alcohol / Substance use:  Not Applicable Psych involvement (Current and /or in the community):  No (Comment)  Discharge Needs  Concerns to be addressed:  Discharge Planning Concerns Readmission within the last 30 days:  No Current discharge risk:  Physical Impairment Barriers to Discharge:  Continued Medical Work up   [place], [place], [place] 01/28/2017, 12:04 PM

## 2017-01-28 NOTE — Progress Notes (Signed)
PULMONARY / CRITICAL CARE MEDICINE   Name: Joseph Hebert MRN: [id number] DOB: 03-May-1946    ADMISSION DATE:  01/22/2017 CONSULTATION DATE:  2/9  REFERRING MD:  NS  CHIEF COMPLAINT:  Hypoxia  HISTORY OF PRESENT ILLNESS:   71 yo male with refractory hypertension, PVD post left fem pop, CAD, Dyspnea on excerption who had anterior abd approach l3-5 fusion.  Early am 2/9 he was hypotensive and hypoxic and RRT was called to bedside. He was transferred to nsicu and promptly improved with stimulation   PMH - DM-2, PAD   STUDIES:  2/9 ct abd>>small rp bleed 2/10 CT chest >> BL effusions 2/10 CT abd/ pelvis >> stable 2/10 head CT >> neg 2/11 echo > EF 65-70%, G1DD, PAP 54. 2/12 LE Duplex >  CULTURES: 2/9 bc x 2>> 2/0 uc>> 2/9 sputum>> Serratia marcescens  ANTIBIOTICS: 2/9 vanc>> 2/9 Zoysyn>>  SIGNIFICANT EVENTS: 2/6 lumbar fusion ant abd approach. 2/9 transferred to ICU with hypotension and hypoxia 2/10 intubated , brady arrest  LINES/TUBES: ETT 2/10 >> Lt [place] CVL 2/10 >>  SUBJECTIVE:  O2 requirements improved (50% FiO2 - now down from 70%, PEEP 12 down from 14).   VITAL SIGNS: BP (!) 102/48   Pulse (!) 57   Temp 98.8 F (37.1 C) (Axillary)   Resp (!) 24   Ht 5' 9.5" (1.765 m)   Wt 113.1 kg (249 lb 5.4 oz)   SpO2 97%   BMI 36.29 kg/m   HEMODYNAMICS: CVP:  [10 mmHg] 10 mmHg  VENTILATOR SETTINGS: Vent Mode: PRVC FiO2 (%):  [45 %-70 %] 45 % Set Rate:  [24 bmp] 24 bmp Vt Set:  [580 mL] 580 mL PEEP:  [10 cmH20-14 cmH20] 10 cmH20 Plateau Pressure:  [20 cmH20-26 cmH20] 20 cmH20  INTAKE / OUTPUT: I/O last 3 completed shifts: In: 2104.7 [I.V.:649.7; NG/GT:705; IV Piggyback:750] Out: 325 [Urine:935]  PHYSICAL EXAMINATION: General: Adult male, critically ill. Neuro: Sedated, opens eyes but not able to follow commands. HEENT: /AT. PERRL, sclerae anicteric. Cardiovascular: RRR, no M/R/G.  Lungs: Respirations even and unlabored.  CTA bilaterally, No W/R/R.   Abdomen: BS x 4, soft, NT/ND.  Musculoskeletal: No gross deformities, no edema.  Skin: Intact, warm, no rashes.     LABS:  BMET  Recent Labs Lab 01/26/17 1326 01/27/17 0500 01/28/17 0900  NA 136 138 136  K 4.6 4.1 4.1  CL 109 106 107  CO2 19* 19* 18*  BUN 64* 71* 85*  CREATININE 3.64* 4.07* 4.84*  GLUCOSE 114* 82 132*    Electrolytes  Recent Labs Lab 01/25/17 0224 01/26/17 1326 01/26/17 1821 01/27/17 0500 01/27/17 0536 01/28/17 0900  CALCIUM 7.3* 8.1*  --  7.7*  --  7.9*  MG  --   --  2.1  --  2.0  --   PHOS 4.4  --   --  3.8  --  4.3    CBC  Recent Labs Lab 01/27/17 0536 01/28/17 0548 01/28/17 0900  WBC 7.4 9.1 9.5  HGB 7.3* 7.2* 7.0*  HCT 22.8* 22.5* 21.9*  PLT 293 347 315    Coag's  Recent Labs Lab 01/22/17 0610 01/23/17 0338  APTT 33 37*  INR 0.99 1.16    Sepsis Markers  Recent Labs Lab 01/26/17 1609  LATICACIDVEN 0.7    ABG  Recent Labs Lab 01/26/17 1614 01/26/17 1656 01/27/17 0820  PHART 7.239* 7.337* 7.351  PCO2ART 46.1 34.4 33.8  PO2ART 74.0* 59.0* 104    Liver Enzymes  Recent Labs  Lab 01/26/17 2140 01/27/17 0500 01/28/17 0900  AST 71*  --   --   ALT 12*  --   --   ALKPHOS 61  --   --   BILITOT 0.8  --   --   ALBUMIN 2.0* 1.8* 1.8*    Cardiac Enzymes  Recent Labs Lab 01/26/17 1821 01/26/17 2140 01/27/17 0536  TROPONINI <0.03 0.03* 0.03*    Glucose  Recent Labs Lab 01/27/17 1537 01/27/17 1950 01/27/17 2354 01/28/17 0337 01/28/17 0829 01/28/17 1139  GLUCAP 103* 132* 128* 126* 131* 131*    Imaging Dg Chest Port 1 View  Result Date: 01/28/2017 CLINICAL DATA:  Hypoxemia. EXAM: PORTABLE CHEST 1 VIEW COMPARISON:  CT 01/26/2017. FINDINGS: Endotracheal tube and NG tube in stable position. Left subclavian line stable position . Cardiomegaly with mild bilateral interstitial prominence and bilateral pleural effusions. Findings suggest CHF. Pneumonitis cannot be excluded . IMPRESSION: 1. Lines and  tubes in stable position. 2. Cardiomegaly with mild bilateral from interstitial prominence consistent with mild CHF. Mild pneumonitis cannot be excluded . Bilateral small pleural effusions. Electronically Signed   By: Marcello Moores  Register   On: 01/28/2017 10:44       DISCUSSION: 71 yo male with refractory hypertension, PVD post left fem pop, CAD, Dyspnea on excerption who had anterior abd approach l3-5 fusion. Early am 2/9 he was hypotensive and hypoxic and RRT was called to bedside. He was transferred to nsicu and promptly improved with stimulation. He was intubated 2/10 & sustained a brady arrest Extreme hypoxia out of proportion to infx - PE remains a possibility   ASSESSMENT / PLAN:  PULMONARY A: Acute hypoxic resp failure - ARDS range hypoxia.  O2 requirements now improving 02/12 (after heparin; therefore, certainly raises suspicion for PE). ? PE - unable to get CT with contrast.  Echo with mildly increased RA pressures and elevated PAP and findings suggestive of elevated CVP.  Certainly concerning for PE given O2 requirements. LE duplex pending. Small b/l pleural effusions. P:   Continue full vent support. Wean as able. Continue empiric heparin. F/u LE duplex. CXR now and in AM.  CARDIOVASCULAR A:  Hypotension in setting of post op lumbar surgery on multiple antihypertensives.  Also some concern obstructive shock from possible PE. P:  Hold antihypertensives till stable. Continue supportive care. Assess CVP's.  RENAL A:   AoCKD - worsening and now oliguric (20cc today so far). P:   Assess chemistry now (none since 2/11). Nephrology following. Correct electrolytes as indicated.  GASTROINTESTINAL A:   Abd distension - resolved. CT abd mild rp hematoma. P:   Continue TF's.  HEMATOLOGIC A:   Mild anemia - likely ABLA. P:  Transfuse for Hgb < 7. SCD's. CBC in AM.  INFECTIOUS A:   Aspiration LLL - treat as HCAP P:   2/9 start V/Z. Await cx  data.  ENDOCRINE A:   DM. P:   Monitor labs.  NEUROLOGIC A:   Acute encephalopathy due to sedation. Post 2/6 ant approach L3-5 fusion. P:   RASS goal: 0 Fent gtt / versed PRN. Post op management per neurosurgery.    FAMILY  Wife updated 2/12.  - Inter-disciplinary family meet or Palliative Care meeting due by:  day 7  CC time: 35 min.   Montey Hora, [place] [name] Pager: [phone]  or [phone] 01/28/2017, 12:15 PM   ATTENDING NOTE / ATTESTATION NOTE :   I have discussed  the case with the resident/APP Montey Hora PA.   I agree with the resident/APP's  history, physical examination, assessment, and plans.    I have edited the above note and modified it according to our agreed history, physical examination, assessment and plan.   Briefly, 71 yo male with refractory hypertension, PVD post left fem pop, CAD, Dyspnea on excerption who had anterior abd approach l3-5 fusion.  Early am 2/9 he was hypotensive and hypoxic and RRT was called to bedside. He was transferred to nsicu. He was intubated.  He went into bradycardia-asytole and severe hypoxemia.  Fio2 slowly being tapered down, now on 45% Fio2 and PEEP 10.   Vitals:  Vitals:   01/28/17 1000 01/28/17 1100 01/28/17 1135 01/28/17 1142  BP: (!) 102/54 (!) 102/48    Pulse: 69 (!) 57    Resp: (!) 24 (!) 24    Temp:      TempSrc:      SpO2: 95% 99% 99% 97%  Weight:      Height:        Constitutional/General: well-nourished, well-developed, intubated, sedated, not in any distress  Body mass index is 36.29 kg/m. Wt Readings from Last 3 Encounters:  01/28/17 113.1 kg (249 lb 5.4 oz)  01/16/17 102.7 kg (226 lb 6 oz)  11/21/16 99.2 kg (218 lb 11.1 oz)    HEENT: PERLA, anicteric sclerae. (-) Oral thrush. Intubated, ETT in place  Neck: No masses. Midline trachea. No JVD, (-) LAD. (-) bruits appreciated.  Respiratory/Chest: Grossly normal chest. (-) deformity. (-)  Accessory muscle use.  Symmetric expansion. Diminished BS on both lower lung zones. (-) wheezing, rhonchi. Crackles at bases.  (-) egophony  Cardiovascular: Regular rate and  rhythm, heart sounds normal, no murmur or gallops,  Trace peripheral edema  Gastrointestinal:  Normal bowel sounds. Soft, non-tender. No hepatosplenomegaly.  (-) masses.   Musculoskeletal:  Normal muscle tone.   Extremities: Grossly normal. (-) clubbing, cyanosis.  (-) edema  Skin: (-) rash,lesions seen.   Neurological/Psychiatric : sedated, intubated. CN grossly intact. (-) lateralizing signs.     CBC Recent Labs     01/27/17  0536  01/28/17  0548  01/28/17  0900  WBC  7.4  9.1  9.5  HGB  7.3*  7.2*  7.0*  HCT  22.8*  22.5*  21.9*  PLT  293  347  315    Coag's No results for input(s): APTT, INR in the last 72 hours.  BMET Recent Labs     01/26/17  1326  01/27/17  0500  01/28/17  0900  NA  136  138  136  K  4.6  4.1  4.1  CL  109  106  107  CO2  19*  19*  18*  BUN  64*  71*  85*  CREATININE  3.64*  4.07*  4.84*  GLUCOSE  114*  82  132*    Electrolytes Recent Labs     01/26/17  1326  01/26/17  1821  01/27/17  0500  01/27/17  0536  01/28/17  0900  CALCIUM  8.1*   --   7.7*   --   7.9*  MG   --   2.1   --   2.0   --   PHOS   --    --   3.8   --   4.3    Sepsis Markers No results for input(s): PROCALCITON, O2SATVEN in the last 72 hours.  Invalid input(s): LACTICACIDVEN  ABG Recent Labs     01/26/17  1614  01/26/17  1656  01/27/17  0820  PHART  7.239*  7.337*  7.351  PCO2ART  46.1  34.4  33.8  PO2ART  74.0*  59.0*  104    Liver Enzymes Recent Labs     01/26/17  2140  01/27/17  0500  01/28/17  0900  AST  71*   --    --   ALT  12*   --    --   ALKPHOS  61   --    --   BILITOT  0.8   --    --   ALBUMIN  2.0*  1.8*  1.8*    Cardiac Enzymes Recent Labs     01/26/17  1821  01/26/17  2140  01/27/17  0536  TROPONINI  <0.03  0.03*  0.03*    Glucose Recent  Labs     01/27/17  1537  01/27/17  1950  01/27/17  2354  01/28/17  0337  01/28/17  0829  01/28/17  1139  GLUCAP  103*  132*  128*  126*  131*  131*    Imaging Ct Abdomen Pelvis Wo Contrast  Result Date: 01/26/2017 CLINICAL DATA:  Recent CPR and lumbar fusion EXAM: CT CHEST, ABDOMEN AND PELVIS WITHOUT CONTRAST TECHNIQUE: Multidetector CT imaging of the chest, abdomen and pelvis was performed following the standard protocol without IV contrast. COMPARISON:  01/25/2017 FINDINGS: CT CHEST FINDINGS Cardiovascular: Somewhat limited due to the lack of IV contrast. Diffuse aortic calcifications are noted. No aneurysmal dilatation is seen. No cardiac enlargement is noted. Coronary calcifications are seen. Mediastinum/Nodes: Thoracic inlet is within normal limits. Endotracheal tube and nasogastric catheter are noted in place. No significant hilar or mediastinal adenopathy is noted. Left jugular central line is noted. Lungs/Pleura: Bilateral pleural effusions are seen with lower lobe atelectatic changes. Patchy changes are noted in the upper lobe of also likely related to some atelectasis. No sizable parenchymal nodule is noted. Mild emphysematous changes are seen. Musculoskeletal: Degenerative change of the thoracic spine is noted. No acute bony abnormality is seen. CT ABDOMEN PELVIS FINDINGS Hepatobiliary: Stable hepatic cyst is noted in the right lobe posteriorly. No gallstones, gallbladder wall thickening, or biliary dilatation. Pancreas: Unremarkable. No pancreatic ductal dilatation or surrounding inflammatory changes. Spleen: Normal in size without focal abnormality. Adrenals/Urinary Tract: Scattered cysts are noted within the kidneys bilaterally. Some of these are hyperdense consistent with hemorrhagic cysts. These are stable in appearance from the prior exam. No renal calculi or obstructive changes are seen. The bladder is decompressed by Foley catheter. Stomach/Bowel: Nasogastric catheter extends into  the second portion of the duodenum. Mild diverticular change of the colon is seen. No obstructive changes are noted. Vascular/Lymphatic: Aortic atherosclerosis. Bilateral iliac stents are noted. No enlarged abdominal or pelvic lymph nodes. Fluid collection is noted in the upper left thigh stable from the prior exam likely related to a postoperative fluid collection from previous vascular surgery. Reproductive: Prostate is unremarkable. Other: Some stranding is noted along the anterior aspect of the left psoas muscle extending towards the inguinal region. This is likely related to the recent surgery although no sizable hematoma is noted. Postsurgical changes along the left rectus muscle and anterior abdominal wall are seen consistent with the recent lumbar fusion. Musculoskeletal: Multilevel lumbar fusion is seen. IMPRESSION: Bilateral pleural effusions and lower lobe and right upper lobe infiltrative/atelectatic changes. These changes have increased in the interval from the prior exam. Stable stranding  in the retroperitoneum on the left extending inferiorly. This is likely related to the recent lumbar fusion. No sizable hematoma is seen. Stable changes in the kidneys bilaterally. Electronically Signed   By: Inez Catalina M.D.   On: 01/26/2017 17:38   Ct Head Wo Contrast  Result Date: 01/26/2017 CLINICAL DATA:  Bradycardia. Altered mental status. Recent lumbar surgery EXAM: CT HEAD WITHOUT CONTRAST TECHNIQUE: Contiguous axial images were obtained from the base of the skull through the vertex without intravenous contrast. COMPARISON:  Head CT 11/17/2012 FINDINGS: Brain: No acute intracranial hemorrhage. No focal mass lesion. No CT evidence of acute infarction. No midline shift or mass effect. No hydrocephalus. Basilar cisterns are patent. Mild deep white matter microvascular disease. Vascular: No hyperdense vessel or unexpected calcification. Skull: Normal. Negative for fracture or focal lesion. Sinuses/Orbits:  Paranasal sinuses and mastoid air cells are clear. Orbits are clear. Other: None. IMPRESSION: 1. No acute intracranial findings. 2. Mild white matter microvascular disease. Electronically Signed   By: Suzy Bouchard M.D.   On: 01/26/2017 17:29   Ct Chest Wo Contrast  Result Date: 01/26/2017 CLINICAL DATA:  Recent CPR and lumbar fusion EXAM: CT CHEST, ABDOMEN AND PELVIS WITHOUT CONTRAST TECHNIQUE: Multidetector CT imaging of the chest, abdomen and pelvis was performed following the standard protocol without IV contrast. COMPARISON:  01/25/2017 FINDINGS: CT CHEST FINDINGS Cardiovascular: Somewhat limited due to the lack of IV contrast. Diffuse aortic calcifications are noted. No aneurysmal dilatation is seen. No cardiac enlargement is noted. Coronary calcifications are seen. Mediastinum/Nodes: Thoracic inlet is within normal limits. Endotracheal tube and nasogastric catheter are noted in place. No significant hilar or mediastinal adenopathy is noted. Left jugular central line is noted. Lungs/Pleura: Bilateral pleural effusions are seen with lower lobe atelectatic changes. Patchy changes are noted in the upper lobe of also likely related to some atelectasis. No sizable parenchymal nodule is noted. Mild emphysematous changes are seen. Musculoskeletal: Degenerative change of the thoracic spine is noted. No acute bony abnormality is seen. CT ABDOMEN PELVIS FINDINGS Hepatobiliary: Stable hepatic cyst is noted in the right lobe posteriorly. No gallstones, gallbladder wall thickening, or biliary dilatation. Pancreas: Unremarkable. No pancreatic ductal dilatation or surrounding inflammatory changes. Spleen: Normal in size without focal abnormality. Adrenals/Urinary Tract: Scattered cysts are noted within the kidneys bilaterally. Some of these are hyperdense consistent with hemorrhagic cysts. These are stable in appearance from the prior exam. No renal calculi or obstructive changes are seen. The bladder is decompressed  by Foley catheter. Stomach/Bowel: Nasogastric catheter extends into the second portion of the duodenum. Mild diverticular change of the colon is seen. No obstructive changes are noted. Vascular/Lymphatic: Aortic atherosclerosis. Bilateral iliac stents are noted. No enlarged abdominal or pelvic lymph nodes. Fluid collection is noted in the upper left thigh stable from the prior exam likely related to a postoperative fluid collection from previous vascular surgery. Reproductive: Prostate is unremarkable. Other: Some stranding is noted along the anterior aspect of the left psoas muscle extending towards the inguinal region. This is likely related to the recent surgery although no sizable hematoma is noted. Postsurgical changes along the left rectus muscle and anterior abdominal wall are seen consistent with the recent lumbar fusion. Musculoskeletal: Multilevel lumbar fusion is seen. IMPRESSION: Bilateral pleural effusions and lower lobe and right upper lobe infiltrative/atelectatic changes. These changes have increased in the interval from the prior exam. Stable stranding in the retroperitoneum on the left extending inferiorly. This is likely related to the recent lumbar fusion. No sizable hematoma is  seen. Stable changes in the kidneys bilaterally. Electronically Signed   By: Inez Catalina M.D.   On: 01/26/2017 17:38   Dg Chest Port 1 View  Result Date: 01/28/2017 CLINICAL DATA:  Hypoxemia. EXAM: PORTABLE CHEST 1 VIEW COMPARISON:  CT 01/26/2017. FINDINGS: Endotracheal tube and NG tube in stable position. Left subclavian line stable position . Cardiomegaly with mild bilateral interstitial prominence and bilateral pleural effusions. Findings suggest CHF. Pneumonitis cannot be excluded . IMPRESSION: 1. Lines and tubes in stable position. 2. Cardiomegaly with mild bilateral from interstitial prominence consistent with mild CHF. Mild pneumonitis cannot be excluded . Bilateral small pleural effusions. Electronically  Signed   By: Marcello Moores  Register   On: 01/28/2017 10:44   Dg Chest Port 1 View  Result Date: 01/26/2017 CLINICAL DATA:  Central line placement EXAM: PORTABLE CHEST 1 VIEW COMPARISON:  01/26/2017 FINDINGS: Endotracheal and NG tube unchanged. Introduction of LEFT central venous line with tip in the distal SVC. No pneumothorax. LEFT basilar atelectasis persist. Improved aeration RIGHT lung base. IMPRESSION: LEFT central venous line placed without pneumothorax. Tip in the distal SVC. Improved aeration of RIGHT lung base. Persistent LEFT basilar atelectasis. Electronically Signed   By: Suzy Bouchard M.D.   On: 01/26/2017 17:02   Dg Chest Port 1 View  Result Date: 01/26/2017 CLINICAL DATA:  Status post intubation EXAM: PORTABLE CHEST 1 VIEW COMPARISON:  01/26/2017 FINDINGS: Cardiac shadow is stable. Endotracheal tube and nasogastric catheter are now seen in satisfactory position. Bilateral pleural effusions are noted with basilar atelectasis. The overall appearance is stable. IMPRESSION: Tubes and lines in satisfactory position. The overall appearance of the chest is stable from the recent exam. Electronically Signed   By: Inez Catalina M.D.   On: 01/26/2017 16:11   Dg Chest Port 1 View  Result Date: 01/26/2017 CLINICAL DATA:  Respiratory failure EXAM: PORTABLE CHEST 1 VIEW COMPARISON:  01/25/2017 FINDINGS: Cardiomegaly with vascular congestion. Low lung volumes with bibasilar atelectasis and possible small effusions. No acute bony abnormality. IMPRESSION: Cardiomegaly with vascular congestion. Bibasilar atelectasis and small effusions. Electronically Signed   By: Rolm Baptise M.D.   On: 01/26/2017 13:39   Dg Abd Portable 1v  Result Date: 01/26/2017 CLINICAL DATA:  Orogastric tube placement EXAM: PORTABLE ABDOMEN - 1 VIEW COMPARISON:  None. FINDINGS: There is a nasogastric tube with the tip projecting over the antrum of the stomach. There is mild gaseous distension of the stomach. There is no bowel  dilatation to suggest obstruction. There is no evidence of pneumoperitoneum, portal venous gas or pneumatosis. There are no pathologic calcifications along the expected course of the ureters. There is posterior lumbar interbody fusion hardware at L3, L4 and L5. IMPRESSION: Nasogastric tube with the tip projecting over the antrum of the stomach. Electronically Signed   By: Kathreen Devoid   On: 01/26/2017 16:10   Assessment/Plan : Acute Hypoxemic Respiratory Failue 2/2 HCAP with Serratia marcescens on sputum ( RLL/RUL PNA)  + Pulm edema.  VTE was also a consideration over the weekend  - cont vent support - not ready for weaning but Fio2 and PEEP slowly coming down - awaiting on dopplers of BLE >> if (-) DVT, will d/c heparin drip - will switch abx to Rocephin.  - suggest to d/c Vancomycin if OK with neurosurgery.   AKI/CKD - Nephrology following.  - anticipate may need CVVH vs HD if with further decline in uo.   S/P L3-L5 fusion -per neurosurgery.   I spent  30  minutes of  Critical Care time with this patient today. This is my time spent independent of the APP or resident.   Family : No family at bedside.    Monica Becton, MD 01/28/2017, 12:15 PM [facility] Pager [phone] After 3 pm or if no answer, call [phone]

## 2017-01-28 NOTE — Progress Notes (Signed)
PT Cancellation Note  Patient Details Name: [name] MRN: [id number] DOB: 1946/11/15   Cancelled Treatment:    Reason Eval/Treat Not Completed: Patient not medically ready pt with PEEP 12 and not very responsive this AM. Will hold PT at this time and continue to follow for appropriateness.    Marguarite Arbour A Calysta Craigo 01/28/2017, 11:31 AM Wray Kearns, PT, DPT [phone]

## 2017-01-28 NOTE — Progress Notes (Signed)
Pt seen and examined. No issues overnight.  EXAM: Temp:  [98.6 F (37 C)-99.8 F (37.7 C)] 98.6 F (37 C) (02/12 0400) Pulse Rate:  [50-75] 68 (02/12 0700) Resp:  [24] 24 (02/12 0700) BP: (80-147)/(41-72) 87/43 (02/12 0700) SpO2:  [92 %-100 %] 100 % (02/12 0700) Arterial Line BP: (105-167)/(34-68) 121/43 (02/12 0700) FiO2 (%):  [50 %-70 %] 50 % (02/12 0813) Weight:  [113.1 kg (249 lb 5.4 oz)] 113.1 kg (249 lb 5.4 oz) (02/12 0431) Intake/Output      02/11 0701 - 02/12 0700 02/12 0701 - 02/13 0700   I.V. (mL/kg) 454.3 (4)    NG/GT 705    IV Piggyback 650    Total Intake(mL/kg) 1809.3 (16)    Urine (mL/kg/hr) 635 (0.2)    Stool 0 (0)    Total Output 635     Net +1174.3          Stool Occurrence 1 x     Intubated Moves legs well  Stable Continue current care No active surgical issues

## 2017-01-28 NOTE — Progress Notes (Signed)
ANTICOAGULATION CONSULT NOTE - Follow-up Consult  Pharmacy Consult for hepairn Indication: r/o PE and r/o ACS  Allergies  Allergen Reactions  . No Known Allergies     Patient Measurements: Height: 5' 9.5" (176.5 cm) Weight: 249 lb 5.4 oz (113.1 kg) IBW/kg (Calculated) : 71.85 Heparin Dosing Weight: 95kg  Vital Signs: Temp: 99.3 F (37.4 C) (02/12 1200) Temp Source: Axillary (02/12 1200) BP: 108/50 (02/12 1500) Pulse Rate: 65 (02/12 1500)  Labs:  Recent Labs  01/26/17 1326 01/26/17 1821 01/26/17 2140 01/27/17 0500 01/27/17 0536 01/27/17 1800 01/28/17 0547 01/28/17 0548 01/28/17 0900 01/28/17 1432  HGB  --   --   --   --  7.3*  --   --  7.2* 7.0*  --   HCT  --   --   --   --  22.8*  --   --  22.5* 21.9*  --   PLT  --   --   --   --  293  --   --  347 315  --   HEPARINUNFRC  --   --   --   --   --  0.55 0.86*  --   --  0.51  CREATININE 3.64*  --   --  4.07*  --   --   --   --  4.84*  --   TROPONINI  --  <0.03 0.03*  --  0.03*  --   --   --   --   --     Estimated Creatinine Clearance: 17.5 mL/min (by C-G formula based on SCr of 4.84 mg/dL (H)).  Assessment: 20 yom presented with hypoxia after recent lumbar fusion on 2/6. Required intubation 2/10 and on IV heparin for r/o PE and r/o ACS. Chest CT (no contrast) didn't show PE. Heparin level supratherapeutic (0.86) on gtt at 1500 units/hr. Hgb low but stable over past 24 hours. No bleeding noted.  Goal of Therapy:  Heparin level 0.3-0.7 units/ml Monitor platelets by anticoagulation protocol: Yes   Plan:  Heparin at 1300 units / hr  Follow up AM labs  Thank you Anette Guarneri, PharmD [phone] 01/28/2017,3:32 PM

## 2017-01-28 NOTE — Progress Notes (Signed)
Decreased FIO2 to 50% and PEEP decreased to 12 by doctor. PT sats are still 98%. PT is stable. RT will continue to monitor.

## 2017-01-28 NOTE — Progress Notes (Signed)
Progress Note  Patient Name: Joseph Hebert Date of Encounter: 01/28/2017  Primary Cardiologist: Domenic Polite (New)  Subjective   Intubated. No complaints. Vital signs are stable.  Inpatient Medications    Scheduled Meds: . acetaminophen  1,000 mg Oral Q6H  . aspirin  81 mg Per Tube Daily  . atorvastatin  80 mg Per Tube QHS  . chlorhexidine gluconate (MEDLINE KIT)  15 mL Mouth Rinse BID  . Chlorhexidine Gluconate Cloth  6 each Topical Q0600  . docusate  100 mg Per Tube BID  . feeding supplement (NEPRO CARB STEADY)  1,000 mL Per Tube Q24H  . gabapentin  300 mg Per Tube Q8H  . insulin aspart  0-9 Units Subcutaneous TID WC  . mouth rinse  15 mL Mouth Rinse 10 times per day  . pantoprazole (PROTONIX) IV  40 mg Intravenous Q24H  . piperacillin-tazobactam (ZOSYN)  IV  2.25 g Intravenous Q8H  . sennosides  5 mL Per Tube BID  . sodium chloride flush  3 mL Intravenous Q12H  . vancomycin  1,500 mg Intravenous Q48H   Continuous Infusions: . sodium chloride 10 mL/hr at 01/26/17 2000  . sodium chloride    . fentaNYL infusion INTRAVENOUS 75 mcg/hr (01/28/17 1120)  . heparin 1,300 Units/hr (01/28/17 1100)  . phenylephrine (NEO-SYNEPHRINE) Adult infusion     PRN Meds: fentaNYL, midazolam, ondansetron (ZOFRAN) IV, sodium chloride flush   Vital Signs    Vitals:   01/28/17 0900 01/28/17 0902 01/28/17 1000 01/28/17 1100  BP: (!) 77/35 (!) 114/42 (!) 102/54 (!) 102/48  Pulse: 69 64 69 (!) 57  Resp: (!) 24 (!) 24 (!) 24 (!) 24  Temp:      TempSrc:      SpO2: 97% 97% 95% 99%  Weight:      Height:        Intake/Output Summary (Last 24 hours) at 01/28/17 1140 Last data filed at 01/28/17 1100  Gross per 24 hour  Intake          1999.52 ml  Output              635 ml  Net          1364.52 ml   Filed Weights   01/25/17 0820 01/27/17 0500 01/28/17 0431  Weight: 242 lb 4.6 oz (109.9 kg) 246 lb 14.6 oz (112 kg) 249 lb 5.4 oz (113.1 kg)    Telemetry    Normal sinus rhythm at a rate  of 60 bpm. - Personally Reviewed  ECG    Severe sinus bradycardia at 47 bpm with anterolateral T-wave inversion on 01/26/17 - Personally Reviewed  Physical Exam  Intubated and not responsive GEN: No acute distress.   Neck: No JVD Cardiac: RRR, no murmurs, rubs, or gallops.  Respiratory: Clear to auscultation bilaterally. GI: Soft, nontender, non-distended  MS: No edema; No deformity. Neuro:  Nonfocal  Psych: Normal affect   Labs    Chemistry Recent Labs Lab 01/26/17 1326 01/26/17 2140 01/27/17 0500 01/28/17 0900  NA 136  --  138 136  K 4.6  --  4.1 4.1  CL 109  --  106 107  CO2 19*  --  19* 18*  GLUCOSE 114*  --  82 132*  BUN 64*  --  71* 85*  CREATININE 3.64*  --  4.07* 4.84*  CALCIUM 8.1*  --  7.7* 7.9*  PROT  --  5.5*  --   --   ALBUMIN  --  2.0* 1.8*  1.8*  AST  --  71*  --   --   ALT  --  12*  --   --   ALKPHOS  --  61  --   --   BILITOT  --  0.8  --   --   GFRNONAA 15*  --  13* 11*  GFRAA 18*  --  16* 13*  ANIONGAP 8  --  13 11     Hematology Recent Labs Lab 01/27/17 0536 01/28/17 0548 01/28/17 0900  WBC 7.4 9.1 9.5  RBC 2.50* 2.48* 2.41*  HGB 7.3* 7.2* 7.0*  HCT 22.8* 22.5* 21.9*  MCV 91.2 90.7 90.9  MCH 29.2 29.0 29.0  MCHC 32.0 32.0 32.0  RDW 16.0* 16.2* 16.3*  PLT 293 347 315    Cardiac Enzymes Recent Labs Lab 01/26/17 1821 01/26/17 2140 01/27/17 0536  TROPONINI <0.03 0.03* 0.03*   No results for input(s): TROPIPOC in the last 168 hours.   BNPNo results for input(s): BNP, PROBNP in the last 168 hours.   DDimer No results for input(s): DDIMER in the last 168 hours.   Radiology    Ct Abdomen Pelvis Wo Contrast  Result Date: 01/26/2017 CLINICAL DATA:  Recent CPR and lumbar fusion EXAM: CT CHEST, ABDOMEN AND PELVIS WITHOUT CONTRAST TECHNIQUE: Multidetector CT imaging of the chest, abdomen and pelvis was performed following the standard protocol without IV contrast. COMPARISON:  01/25/2017 FINDINGS: CT CHEST FINDINGS Cardiovascular:  Somewhat limited due to the lack of IV contrast. Diffuse aortic calcifications are noted. No aneurysmal dilatation is seen. No cardiac enlargement is noted. Coronary calcifications are seen. Mediastinum/Nodes: Thoracic inlet is within normal limits. Endotracheal tube and nasogastric catheter are noted in place. No significant hilar or mediastinal adenopathy is noted. Left jugular central line is noted. Lungs/Pleura: Bilateral pleural effusions are seen with lower lobe atelectatic changes. Patchy changes are noted in the upper lobe of also likely related to some atelectasis. No sizable parenchymal nodule is noted. Mild emphysematous changes are seen. Musculoskeletal: Degenerative change of the thoracic spine is noted. No acute bony abnormality is seen. CT ABDOMEN PELVIS FINDINGS Hepatobiliary: Stable hepatic cyst is noted in the right lobe posteriorly. No gallstones, gallbladder wall thickening, or biliary dilatation. Pancreas: Unremarkable. No pancreatic ductal dilatation or surrounding inflammatory changes. Spleen: Normal in size without focal abnormality. Adrenals/Urinary Tract: Scattered cysts are noted within the kidneys bilaterally. Some of these are hyperdense consistent with hemorrhagic cysts. These are stable in appearance from the prior exam. No renal calculi or obstructive changes are seen. The bladder is decompressed by Foley catheter. Stomach/Bowel: Nasogastric catheter extends into the second portion of the duodenum. Mild diverticular change of the colon is seen. No obstructive changes are noted. Vascular/Lymphatic: Aortic atherosclerosis. Bilateral iliac stents are noted. No enlarged abdominal or pelvic lymph nodes. Fluid collection is noted in the upper left thigh stable from the prior exam likely related to a postoperative fluid collection from previous vascular surgery. Reproductive: Prostate is unremarkable. Other: Some stranding is noted along the anterior aspect of the left psoas muscle extending  towards the inguinal region. This is likely related to the recent surgery although no sizable hematoma is noted. Postsurgical changes along the left rectus muscle and anterior abdominal wall are seen consistent with the recent lumbar fusion. Musculoskeletal: Multilevel lumbar fusion is seen. IMPRESSION: Bilateral pleural effusions and lower lobe and right upper lobe infiltrative/atelectatic changes. These changes have increased in the interval from the prior exam. Stable stranding in the retroperitoneum on the left  extending inferiorly. This is likely related to the recent lumbar fusion. No sizable hematoma is seen. Stable changes in the kidneys bilaterally. Electronically Signed   By: Inez Catalina M.D.   On: 01/26/2017 17:38   Ct Head Wo Contrast  Result Date: 01/26/2017 CLINICAL DATA:  Bradycardia. Altered mental status. Recent lumbar surgery EXAM: CT HEAD WITHOUT CONTRAST TECHNIQUE: Contiguous axial images were obtained from the base of the skull through the vertex without intravenous contrast. COMPARISON:  Head CT 11/17/2012 FINDINGS: Brain: No acute intracranial hemorrhage. No focal mass lesion. No CT evidence of acute infarction. No midline shift or mass effect. No hydrocephalus. Basilar cisterns are patent. Mild deep white matter microvascular disease. Vascular: No hyperdense vessel or unexpected calcification. Skull: Normal. Negative for fracture or focal lesion. Sinuses/Orbits: Paranasal sinuses and mastoid air cells are clear. Orbits are clear. Other: None. IMPRESSION: 1. No acute intracranial findings. 2. Mild white matter microvascular disease. Electronically Signed   By: Suzy Bouchard M.D.   On: 01/26/2017 17:29   Ct Chest Wo Contrast  Result Date: 01/26/2017 CLINICAL DATA:  Recent CPR and lumbar fusion EXAM: CT CHEST, ABDOMEN AND PELVIS WITHOUT CONTRAST TECHNIQUE: Multidetector CT imaging of the chest, abdomen and pelvis was performed following the standard protocol without IV contrast.  COMPARISON:  01/25/2017 FINDINGS: CT CHEST FINDINGS Cardiovascular: Somewhat limited due to the lack of IV contrast. Diffuse aortic calcifications are noted. No aneurysmal dilatation is seen. No cardiac enlargement is noted. Coronary calcifications are seen. Mediastinum/Nodes: Thoracic inlet is within normal limits. Endotracheal tube and nasogastric catheter are noted in place. No significant hilar or mediastinal adenopathy is noted. Left jugular central line is noted. Lungs/Pleura: Bilateral pleural effusions are seen with lower lobe atelectatic changes. Patchy changes are noted in the upper lobe of also likely related to some atelectasis. No sizable parenchymal nodule is noted. Mild emphysematous changes are seen. Musculoskeletal: Degenerative change of the thoracic spine is noted. No acute bony abnormality is seen. CT ABDOMEN PELVIS FINDINGS Hepatobiliary: Stable hepatic cyst is noted in the right lobe posteriorly. No gallstones, gallbladder wall thickening, or biliary dilatation. Pancreas: Unremarkable. No pancreatic ductal dilatation or surrounding inflammatory changes. Spleen: Normal in size without focal abnormality. Adrenals/Urinary Tract: Scattered cysts are noted within the kidneys bilaterally. Some of these are hyperdense consistent with hemorrhagic cysts. These are stable in appearance from the prior exam. No renal calculi or obstructive changes are seen. The bladder is decompressed by Foley catheter. Stomach/Bowel: Nasogastric catheter extends into the second portion of the duodenum. Mild diverticular change of the colon is seen. No obstructive changes are noted. Vascular/Lymphatic: Aortic atherosclerosis. Bilateral iliac stents are noted. No enlarged abdominal or pelvic lymph nodes. Fluid collection is noted in the upper left thigh stable from the prior exam likely related to a postoperative fluid collection from previous vascular surgery. Reproductive: Prostate is unremarkable. Other: Some stranding  is noted along the anterior aspect of the left psoas muscle extending towards the inguinal region. This is likely related to the recent surgery although no sizable hematoma is noted. Postsurgical changes along the left rectus muscle and anterior abdominal wall are seen consistent with the recent lumbar fusion. Musculoskeletal: Multilevel lumbar fusion is seen. IMPRESSION: Bilateral pleural effusions and lower lobe and right upper lobe infiltrative/atelectatic changes. These changes have increased in the interval from the prior exam. Stable stranding in the retroperitoneum on the left extending inferiorly. This is likely related to the recent lumbar fusion. No sizable hematoma is seen. Stable changes in the kidneys  bilaterally. Electronically Signed   By: Inez Catalina M.D.   On: 01/26/2017 17:38   Dg Chest Port 1 View  Result Date: 01/28/2017 CLINICAL DATA:  Hypoxemia. EXAM: PORTABLE CHEST 1 VIEW COMPARISON:  CT 01/26/2017. FINDINGS: Endotracheal tube and NG tube in stable position. Left subclavian line stable position . Cardiomegaly with mild bilateral interstitial prominence and bilateral pleural effusions. Findings suggest CHF. Pneumonitis cannot be excluded . IMPRESSION: 1. Lines and tubes in stable position. 2. Cardiomegaly with mild bilateral from interstitial prominence consistent with mild CHF. Mild pneumonitis cannot be excluded . Bilateral small pleural effusions. Electronically Signed   By: Marcello Moores  Register   On: 01/28/2017 10:44   Dg Chest Port 1 View  Result Date: 01/26/2017 CLINICAL DATA:  Central line placement EXAM: PORTABLE CHEST 1 VIEW COMPARISON:  01/26/2017 FINDINGS: Endotracheal and NG tube unchanged. Introduction of LEFT central venous line with tip in the distal SVC. No pneumothorax. LEFT basilar atelectasis persist. Improved aeration RIGHT lung base. IMPRESSION: LEFT central venous line placed without pneumothorax. Tip in the distal SVC. Improved aeration of RIGHT lung base. Persistent  LEFT basilar atelectasis. Electronically Signed   By: Suzy Bouchard M.D.   On: 01/26/2017 17:02   Dg Chest Port 1 View  Result Date: 01/26/2017 CLINICAL DATA:  Status post intubation EXAM: PORTABLE CHEST 1 VIEW COMPARISON:  01/26/2017 FINDINGS: Cardiac shadow is stable. Endotracheal tube and nasogastric catheter are now seen in satisfactory position. Bilateral pleural effusions are noted with basilar atelectasis. The overall appearance is stable. IMPRESSION: Tubes and lines in satisfactory position. The overall appearance of the chest is stable from the recent exam. Electronically Signed   By: Inez Catalina M.D.   On: 01/26/2017 16:11   Dg Chest Port 1 View  Result Date: 01/26/2017 CLINICAL DATA:  Respiratory failure EXAM: PORTABLE CHEST 1 VIEW COMPARISON:  01/25/2017 FINDINGS: Cardiomegaly with vascular congestion. Low lung volumes with bibasilar atelectasis and possible small effusions. No acute bony abnormality. IMPRESSION: Cardiomegaly with vascular congestion. Bibasilar atelectasis and small effusions. Electronically Signed   By: Rolm Baptise M.D.   On: 01/26/2017 13:39   Dg Abd Portable 1v  Result Date: 01/26/2017 CLINICAL DATA:  Orogastric tube placement EXAM: PORTABLE ABDOMEN - 1 VIEW COMPARISON:  None. FINDINGS: There is a nasogastric tube with the tip projecting over the antrum of the stomach. There is mild gaseous distension of the stomach. There is no bowel dilatation to suggest obstruction. There is no evidence of pneumoperitoneum, portal venous gas or pneumatosis. There are no pathologic calcifications along the expected course of the ureters. There is posterior lumbar interbody fusion hardware at L3, L4 and L5. IMPRESSION: Nasogastric tube with the tip projecting over the antrum of the stomach. Electronically Signed   By: Kathreen Devoid   On: 01/26/2017 16:10    Cardiac Studies   Echocardiogram 01/27/17:  Study Conclusions  - Left ventricle: The cavity size was normal. Wall  thickness was   increased in a pattern of mild LVH. Systolic function was   vigorous. The estimated ejection fraction was in the range of 65%   to 70%. Wall motion was normal; there were no regional wall   motion abnormalities. Doppler parameters are consistent with   abnormal left ventricular relaxation (grade 1 diastolic   dysfunction). - Right atrium: The atrium was mildly dilated. - Pulmonary arteries: Systolic pressure was moderately increased.   PA peak pressure: 54 mm Hg (S).  Patient Profile     71 y.o. male with Brady-asystolic  cardiac arrest following following anterior cervical fusion. History of carotid stenosis, left femoropopliteal bypass 2016, COPD, dementia, hypertension, hyperlipidemia, and stage IV chronic kidney disease. Working cardiac diagnosis is bradycardia related to vagotonia.  Assessment & Plan    1. Bradycardia-asystolic cardiac arrest likely related to vagotonia. So far, no evidence of myocardial infarction by cardiac markers. No recurrence of bradycardia. Plan is clinical observation for evidence of recurrence. We will repeat an electrocardiogram today. No indication for pacemaker at this point. 2. Peripheral arterial disease 3. Normal LV function on echo without regional wall motion abnormality is reassuring with reference to the absence of an underlying ischemic cardiac event.  Signed, Sinclair Grooms, MD  01/28/2017, 11:40 AM

## 2017-01-28 NOTE — Progress Notes (Signed)
**  Preliminary report by tech**  Bilateral lower extremity venous duplex completed. There is no obvious evidence of deep or superficial vein thrombosis involving the right and left lower extremities. All clearly visualized vessels appear patent and compressible. There is no evidence of Baker's cysts bilaterally. Incidental findings are consistent with an enlarged lymph node measuring 0.7 cm high by 1.2 cm wide by 2.1 cm long in the right groin. Also, incidental findings are consistent with a large, anechoic structure in the left groin measuring 3.1 cm high by greater than 4.8 cm wide by greater than 5 cm long of unknown etiology. Unable to rule out hematoma. Results given to the patient's nurse, Jamie.  01/28/17 3:15 PM Carlos Levering RVT

## 2017-01-28 NOTE — Progress Notes (Signed)
Admit: 01/22/2017 LOS: 6  58M s/p L3-L5 fusion ant approach,  with VDRF-Hypoxic, Brief PEA arrest, hypotension, HCAP LLL PNA; AoCKD in setting of hypotension/ACEi/NSAID  Subjective:  Not on pressors UOP 0.6L yesterday, down from previous No renal labs yet this AM  Wife at bedside, updated  02/11 0701 - 02/12 0700 In: 1809.3 [I.V.:454.3; NG/GT:705; IV Piggyback:650] Out: 635 [Urine:635]  Filed Weights   01/25/17 0820 01/27/17 0500 01/28/17 0431  Weight: 109.9 kg (242 lb 4.6 oz) 112 kg (246 lb 14.6 oz) 113.1 kg (249 lb 5.4 oz)    Scheduled Meds: . acetaminophen  1,000 mg Oral Q6H  . aspirin  81 mg Per Tube Daily  . atorvastatin  80 mg Per Tube QHS  . chlorhexidine gluconate (MEDLINE KIT)  15 mL Mouth Rinse BID  . Chlorhexidine Gluconate Cloth  6 each Topical Q0600  . docusate  100 mg Per Tube BID  . feeding supplement (NEPRO CARB STEADY)  1,000 mL Per Tube Q24H  . gabapentin  300 mg Per Tube Q8H  . insulin aspart  0-9 Units Subcutaneous TID WC  . mouth rinse  15 mL Mouth Rinse 10 times per day  . pantoprazole (PROTONIX) IV  40 mg Intravenous Q24H  . piperacillin-tazobactam (ZOSYN)  IV  2.25 g Intravenous Q8H  . sennosides  5 mL Per Tube BID  . sodium chloride flush  3 mL Intravenous Q12H  . vancomycin  1,500 mg Intravenous Q48H   Continuous Infusions: . sodium chloride 10 mL/hr at 01/26/17 2000  . sodium chloride    . fentaNYL infusion INTRAVENOUS 75 mcg/hr (01/27/17 2130)  . heparin 1,300 Units/hr (01/28/17 3536)  . phenylephrine (NEO-SYNEPHRINE) Adult infusion     PRN Meds:.fentaNYL, midazolam, ondansetron (ZOFRAN) IV, sodium chloride flush  Current Labs: reviewed    Physical Exam:  Blood pressure (!) 87/43, pulse 68, temperature 98.6 F (37 C), temperature source Axillary, resp. rate (!) 24, height 5' 9.5" (1.765 m), weight 113.1 kg (249 lb 5.4 oz), SpO2 100 %. Intubated, sedated ETT in place Good air mov't b/l Foley catheter in place, amber urine abd not tight,  quiet BS  A 1. AoCKD in setting of hypotension / anemia / NSAID / ACEi 1. BL SCr 2.2, sees Deterding CKA 2. Lisinopril, lasix, celebrex in perioperative period 3. 2/10 CT A w/o renal obstruction 2. S/p ant Lumbar fusion 01/22/17; prolonged surgery 3. Hypotension 4. VDRF - Hypoxia 5. Bradycardic Arrest 2/9 6. PVD hx/o bypass and iliac stents 7. COPD 8. Hypotension. ? PE on hep gtt 9. HCAP on Vanc/Zosyn 10. HTN on ACEi 11. CAD 51. COPD 48. Obesity 14. Anemia 15. ? Alzheimers by chart review  P 1. F/u am labs 2. Falling UOP worrisome   Pearson Grippe MD 01/28/2017, 8:30 AM   Recent Labs Lab 01/25/17 0224 01/26/17 1326 01/27/17 0500  NA 134* 136 138  K 4.4 4.6 4.1  CL 104 109 106  CO2 19* 19* 19*  GLUCOSE 104* 114* 82  BUN 68* 64* 71*  CREATININE 4.64* 3.64* 4.07*  CALCIUM 7.3* 8.1* 7.7*  PHOS 4.4  --  3.8    Recent Labs Lab 01/26/17 1025 01/27/17 0536 01/28/17 0548  WBC 8.5 7.4 9.1  NEUTROABS 5.8 5.3  --   HGB 8.8* 7.3* 7.2*  HCT 28.1* 22.8* 22.5*  MCV 93.0 91.2 90.7  PLT 262 293 347

## 2017-01-28 NOTE — Progress Notes (Signed)
Initial Nutrition Assessment  DOCUMENTATION CODES:   Obesity unspecified  INTERVENTION:   Decrease Nepro with Carb Steady to 15 ml/hr (360 ml/day) Add 60 ml Prostat QID Provides: 1448 kcal, 149 grams protein, and 261 ml H2O.    NUTRITION DIAGNOSIS:   Inadequate oral intake related to inability to eat as evidenced by NPO status.  GOAL:   Provide needs based on ASPEN/SCCM guidelines  MONITOR:   TF tolerance, I & O's, Vent status, Labs  REASON FOR ASSESSMENT:   Consult Enteral/tube feeding initiation and management  ASSESSMENT:   Pt with Brady-asystolic cardiac arrest following following anterior cervical fusion. History of carotid stenosis, left femoropopliteal bypass 2016, COPD, dementia, hypertension, hyperlipidemia, and stage IV chronic kidney disease.  Pt discussed during ICU rounds and with RN.  2/10 intubated 2/11 Started on the adult tube feeding protocol, nepro with carb steady at 30 ml/hr (1296 kcal, 58 grams protein) Nutrition-Focused physical exam completed. Findings are no fat depletion, no muscle depletion, and mild edema.  PO4, K+, magnesium have all been WNL, Cr 4.84 CBG's: 131   Diet Order:  Diet NPO time specified  Skin:  Reviewed, no issues (surgical incisions)  Last BM:  2/12  Height:   Ht Readings from Last 1 Encounters:  01/25/17 5' 9.5" (1.765 m)    Weight:   Wt Readings from Last 1 Encounters:  01/28/17 249 lb 5.4 oz (113.1 kg)    Ideal Body Weight:  74 kg  BMI:  Body mass index is 36.29 kg/m.  Estimated Nutritional Needs:   Kcal:  2111-5520  Protein:  >/= 148 grams  Fluid:  > 1.5 L/day  EDUCATION NEEDS:   No education needs identified at this time  [employer], [place], [place] Pager [phone] After Hours Pager

## 2017-01-28 NOTE — Progress Notes (Signed)
ANTICOAGULATION CONSULT NOTE - Follow-up Consult  Pharmacy Consult for hepairn Indication: r/o PE and r/o ACS  Allergies  Allergen Reactions  . No Known Allergies     Patient Measurements: Height: 5' 9.5" (176.5 cm) Weight: 249 lb 5.4 oz (113.1 kg) IBW/kg (Calculated) : 71.85 Heparin Dosing Weight: 95kg  Vital Signs: Temp: 98.6 F (37 C) (02/12 0400) Temp Source: Axillary (02/12 0400) BP: 80/47 (02/12 0500) Pulse Rate: 75 (02/12 0500)  Labs:  Recent Labs  01/26/17 1025 01/26/17 1326 01/26/17 1821 01/26/17 2140 01/27/17 0500 01/27/17 0536 01/27/17 1800 01/28/17 0547 01/28/17 0548  HGB 8.8*  --   --   --   --  7.3*  --   --  7.2*  HCT 28.1*  --   --   --   --  22.8*  --   --  22.5*  PLT 262  --   --   --   --  293  --   --  347  HEPARINUNFRC  --   --   --   --   --   --  0.55 0.86*  --   CREATININE  --  3.64*  --   --  4.07*  --   --   --   --   TROPONINI  --   --  <0.03 0.03*  --  0.03*  --   --   --     Estimated Creatinine Clearance: 20.8 mL/min (by C-G formula based on SCr of 4.07 mg/dL (H)).  Assessment: 62 yom presented with hypoxia after recent lumbar fusion on 2/6. Required intubation 2/10 and on IV heparin for r/o PE and r/o ACS. Chest CT (no contrast) didn't show PE. Heparin level supratherapeutic (0.86) on gtt at 1500 units/hr. Hgb low but stable over past 24 hours. No bleeding noted.  Goal of Therapy:  Heparin level 0.3-0.7 units/ml Monitor platelets by anticoagulation protocol: Yes   Plan:  Decrease heparin drip to 1300 units/hr Check an 8 hr heparin level  Sherlon Handing, PharmD, BCPS Clinical pharmacist, pager [phone] 01/28/2017,6:17 AM

## 2017-01-29 ENCOUNTER — Inpatient Hospital Stay (HOSPITAL_COMMUNITY): Payer: Medicare Other

## 2017-01-29 DIAGNOSIS — I495 Sick sinus syndrome: Secondary | ICD-10-CM

## 2017-01-29 LAB — POCT I-STAT 3, ART BLOOD GAS (G3+)
ACID-BASE DEFICIT: 5 mmol/L — AB (ref 0.0–2.0)
Bicarbonate: 20.2 mmol/L (ref 20.0–28.0)
O2 SAT: 96 %
PO2 ART: 83 mmHg (ref 83.0–108.0)
Patient temperature: 99.6
TCO2: 21 mmol/L (ref 0–100)
pCO2 arterial: 35.1 mmHg (ref 32.0–48.0)
pH, Arterial: 7.371 (ref 7.350–7.450)

## 2017-01-29 LAB — HEPARIN LEVEL (UNFRACTIONATED): Heparin Unfractionated: <0.1 IU/mL — ABNORMAL LOW (ref 0.30–0.70)

## 2017-01-29 LAB — OCCULT BLOOD X 1 CARD TO LAB, STOOL
Fecal Occult Bld: POSITIVE — AB
Fecal Occult Bld: POSITIVE — AB
Fecal Occult Bld: POSITIVE — AB

## 2017-01-29 LAB — GLUCOSE, CAPILLARY
GLUCOSE-CAPILLARY: 122 mg/dL — AB (ref 65–99)
GLUCOSE-CAPILLARY: 120 mg/dL — AB (ref 65–99)
GLUCOSE-CAPILLARY: 124 mg/dL — AB (ref 65–99)
Glucose-Capillary: 104 mg/dL — ABNORMAL HIGH (ref 65–99)
Glucose-Capillary: 134 mg/dL — ABNORMAL HIGH (ref 65–99)
Glucose-Capillary: 111 mg/dL — ABNORMAL HIGH (ref 65–99)

## 2017-01-29 LAB — BASIC METABOLIC PANEL
Anion gap: 11 (ref 5–15)
BUN: 96 mg/dL — AB (ref 6–20)
CALCIUM: 8.4 mg/dL — AB (ref 8.9–10.3)
CO2: 19 mmol/L — AB (ref 22–32)
CREATININE: 4.19 mg/dL — AB (ref 0.61–1.24)
Chloride: 108 mmol/L (ref 101–111)
GFR calc Af Amer: 15 mL/min — ABNORMAL LOW (ref >60)
GFR calc non Af Amer: 13 mL/min — ABNORMAL LOW (ref >60)
Glucose, Bld: 124 mg/dL — ABNORMAL HIGH (ref 65–99)
Potassium: 3.6 mmol/L (ref 3.5–5.1)
SODIUM: 138 mmol/L (ref 135–145)

## 2017-01-29 LAB — CBC
HEMATOCRIT: 23.2 % — AB (ref 39.0–52.0)
Hemoglobin: 7.4 g/dL — ABNORMAL LOW (ref 13.0–17.0)
MCH: 29.1 pg (ref 26.0–34.0)
MCHC: 31.9 g/dL (ref 30.0–36.0)
MCV: 91.3 fL (ref 78.0–100.0)
Platelets: 374 10*3/uL (ref 150–400)
RBC: 2.54 MIL/uL — ABNORMAL LOW (ref 4.22–5.81)
RDW: 16.5 % — AB (ref 11.5–15.5)
WBC: 11.5 10*3/uL — ABNORMAL HIGH (ref 4.0–10.5)

## 2017-01-29 LAB — CULTURE, RESPIRATORY

## 2017-01-29 LAB — CULTURE, RESPIRATORY W GRAM STAIN: Special Requests: NORMAL

## 2017-01-29 LAB — MAGNESIUM: Magnesium: 2.4 mg/dL (ref 1.7–2.4)

## 2017-01-29 LAB — PHOSPHORUS: PHOSPHORUS: 4.6 mg/dL (ref 2.5–4.6)

## 2017-01-29 MED ORDER — FUROSEMIDE 10 MG/ML IJ SOLN
40.0000 mg | Freq: Once | INTRAMUSCULAR | Status: AC
  Administered 2017-01-29: 40 mg via INTRAVENOUS
  Filled 2017-01-29: qty 4

## 2017-01-29 NOTE — Progress Notes (Signed)
Pt seen and examined. No issues overnight.  EXAM: Temp:  [98.6 F (37 C)-99.1 F (37.3 C)] 98.8 F (37.1 C) (02/13 1200) Pulse Rate:  [58-73] 58 (02/13 1300) Resp:  [21-24] 24 (02/13 1300) BP: (87-170)/(43-104) 95/49 (02/13 1300) SpO2:  [88 %-100 %] 93 % (02/13 1300) Arterial Line BP: (139-207)/(47-58) 159/48 (02/13 1100) FiO2 (%):  [40 %-60 %] 45 % (02/13 1041) Weight:  [111.1 kg (244 lb 14.9 oz)] 111.1 kg (244 lb 14.9 oz) (02/13 0403) Intake/Output      02/12 0701 - 02/13 0700 02/13 0701 - 02/14 0700   I.V. (mL/kg) 284 (2.6) 52.5 (0.5)   NG/GT 587.3    IV Piggyback     Total Intake(mL/kg) 871.3 (7.8) 52.5 (0.5)   Urine (mL/kg/hr) 1125 (0.4) 575 (0.7)   Stool 0 (0)    Total Output 1125 575   Net -253.8 -522.5        Stool Occurrence 6 x     Awake and alert Follows commands throughout Full strength  Stable Pulmonary and renal function improving Continue current care

## 2017-01-29 NOTE — Progress Notes (Signed)
Called to the room per RN to assess patient ETT. On arrival noted that patient is losing Expiratory volumes and decrease minute ventilation. circuit has no leaks. Cuff was completely deflated and re-inflated and noted there was slow leak around the ETT or cuff leak. MD [facility] aware. MD says he will make interventions to notify CCM. RN aware

## 2017-01-29 NOTE — Progress Notes (Signed)
eLink Physician-Brief Progress Note Patient Name: Joseph Hebert DOB: [birth year] MRN: [id number]   Date of Service  01/29/2017  HPI/Events of Note  Hypoxia - increased O2 requirement.   eICU Interventions  Will order: 1. Increase PEEP to 8 cm H2O. 2. ABG at 7:30 PM.     Intervention Category Major Interventions: Hypoxemia - evaluation and management  Lysle Dingwall 01/29/2017, 6:23 PM

## 2017-01-29 NOTE — Progress Notes (Signed)
RT not weaning the PEEP and FIO2 but there is a limitation due to trying to keep SATS 95% or above.

## 2017-01-29 NOTE — Progress Notes (Signed)
Placed Pt on wean 5/5 and 40%. [name] SATS went to 86% and his RR was 9. RT gave a 100% breath and his sats went up to 92% and then dropped again. RT placed the Pt on 10/5 and 45% and his SATS were 87% and his RR was 7. RT placed Pt on full support. RT will continue to monitor.

## 2017-01-29 NOTE — Progress Notes (Addendum)
Progress Note  Patient Name: Joseph Hebert Date of Encounter: 01/29/2017  Primary Cardiologist: Domenic Polite ([place])  Subjective   Still intubated. Wife is at bedside. Discussed care with his nurse. No significant interval arrhythmias since last visit. No complaints.   Inpatient Medications    Scheduled Meds: . acetaminophen  1,000 mg Oral Q6H  . aspirin  81 mg Per Tube Daily  . atorvastatin  80 mg Per Tube QHS  . cefTRIAXone (ROCEPHIN)  IV  2 g Intravenous Q24H  . chlorhexidine gluconate (MEDLINE KIT)  15 mL Mouth Rinse BID  . Chlorhexidine Gluconate Cloth  6 each Topical Q0600  . docusate  100 mg Per Tube BID  . feeding supplement (NEPRO CARB STEADY)  1,000 mL Per Tube Q24H  . feeding supplement (PRO-STAT SUGAR FREE 64)  60 mL Per Tube QID  . gabapentin  300 mg Per Tube Q8H  . heparin subcutaneous  5,000 Units Subcutaneous Q8H  . insulin aspart  0-9 Units Subcutaneous TID WC  . mouth rinse  15 mL Mouth Rinse 10 times per day  . pantoprazole (PROTONIX) IV  40 mg Intravenous Q24H  . sennosides  5 mL Per Tube BID  . sodium chloride flush  3 mL Intravenous Q12H  . vancomycin  1,500 mg Intravenous Q48H   Continuous Infusions: . sodium chloride 10 mL/hr at 01/26/17 2000  . sodium chloride    . fentaNYL infusion INTRAVENOUS 75 mcg/hr (01/29/17 1100)  . phenylephrine (NEO-SYNEPHRINE) Adult infusion     PRN Meds: fentaNYL, midazolam, ondansetron (ZOFRAN) IV, sodium chloride flush   Vital Signs    Vitals:   01/29/17 0600 01/29/17 0739 01/29/17 0800 01/29/17 1100  BP: (!) 140/104 (!) 115/57  (!) 126/54  Pulse: 70   66  Resp: (!) 24   (!) 24  Temp:   98.9 F (37.2 C)   TempSrc:   Axillary   SpO2: 91%   91%  Weight:      Height:        Intake/Output Summary (Last 24 hours) at 01/29/17 1237 Last data filed at 01/29/17 1215  Gross per 24 hour  Intake           699.25 ml  Output             1600 ml  Net          -900.75 ml   Filed Weights   01/27/17 0500 01/28/17 0431  01/29/17 0403  Weight: 246 lb 14.6 oz (112 kg) 249 lb 5.4 oz (113.1 kg) 244 lb 14.9 oz (111.1 kg)    Telemetry    Normal sinus rhythm at a rate of 60 bpm.No significant ventricular arrhythmias. - Personally Reviewed  ECG    Sinus Rhythm at 68 bpm with with no evidence of injury or evolutionary changes since the tracing on 01/26/17. - Personally Reviewed  Physical Exam  Intubated and not responsive. Obese. GEN: No acute distress.   Neck: No JVD Cardiac: RRR, no murmurs, rubs, or gallops.  Respiratory: Clear to auscultation bilaterally. GI: Soft, nontender, non-distended  MS: No edema; No deformity. Neuro:  Nonfocal  Psych: Normal affect   Labs    Chemistry  Recent Labs Lab 01/26/17 2140 01/27/17 0500 01/28/17 0900 01/29/17 0540  NA  --  138 136 138  K  --  4.1 4.1 3.6  CL  --  106 107 108  CO2  --  19* 18* 19*  GLUCOSE  --  82 132* 124*  BUN  --  71* 85* 96*  CREATININE  --  4.07* 4.84* 4.19*  CALCIUM  --  7.7* 7.9* 8.4*  PROT 5.5*  --   --   --   ALBUMIN 2.0* 1.8* 1.8*  --   AST 71*  --   --   --   ALT 12*  --   --   --   ALKPHOS 61  --   --   --   BILITOT 0.8  --   --   --   GFRNONAA  --  13* 11* 13*  GFRAA  --  16* 13* 15*  ANIONGAP  --  '13 11 11     ' Hematology  Recent Labs Lab 01/28/17 0548 01/28/17 0900 01/29/17 0540  WBC 9.1 9.5 11.5*  RBC 2.48* 2.41* 2.54*  HGB 7.2* 7.0* 7.4*  HCT 22.5* 21.9* 23.2*  MCV 90.7 90.9 91.3  MCH 29.0 29.0 29.1  MCHC 32.0 32.0 31.9  RDW 16.2* 16.3* 16.5*  PLT 347 315 374    Cardiac Enzymes  Recent Labs Lab 01/26/17 1821 01/26/17 2140 01/27/17 0536  TROPONINI <0.03 0.03* 0.03*   No results for input(s): TROPIPOC in the last 168 hours.   BNPNo results for input(s): BNP, PROBNP in the last 168 hours.   DDimer No results for input(s): DDIMER in the last 168 hours.   Radiology    Dg Chest Port 1 View  Result Date: 01/29/2017 CLINICAL DATA:  Hypertension, COPD and asthma.  Ventilator support. EXAM:  PORTABLE CHEST 1 VIEW COMPARISON:  01/28/2017 FINDINGS: Endotracheal tube tip is 5 cm above the carina. Left subclavian central line tip in the SVC above the right atrium. Nasogastric tube enters the abdomen. There is worsening infiltrate/ volume loss in both lower lobes. There could be a degree of fluid overload/edema coexistent. IMPRESSION: Lines and tubes well positioned. Worsening of bilateral lower lobe atelectasis and/or infiltrate. There could be coexistent edema/fluid overload as well. Electronically Signed   By: [name] Chimes M.D.   On: 01/29/2017 07:14   Dg Chest Port 1 View  Result Date: 01/28/2017 CLINICAL DATA:  Hypoxemia. EXAM: PORTABLE CHEST 1 VIEW COMPARISON:  CT 01/26/2017. FINDINGS: Endotracheal tube and NG tube in stable position. Left subclavian line stable position . Cardiomegaly with mild bilateral interstitial prominence and bilateral pleural effusions. Findings suggest CHF. Pneumonitis cannot be excluded . IMPRESSION: 1. Lines and tubes in stable position. 2. Cardiomegaly with mild bilateral from interstitial prominence consistent with mild CHF. Mild pneumonitis cannot be excluded . Bilateral small pleural effusions. Electronically Signed   By: Marcello Moores  Register   On: 01/28/2017 10:44    Cardiac Studies   Echocardiogram 01/27/17:  Study Conclusions  - Left ventricle: The cavity size was normal. Wall thickness was   increased in a pattern of mild LVH. Systolic function was   vigorous. The estimated ejection fraction was in the range of 65%   to 70%. Wall motion was normal; there were no regional wall   motion abnormalities. Doppler parameters are consistent with   abnormal left ventricular relaxation (grade 1 diastolic   dysfunction). - Right atrium: The atrium was mildly dilated. - Pulmonary arteries: Systolic pressure was moderately increased.   PA peak pressure: 54 mm Hg (S).  Patient Profile     71 y.o. male with Brady-asystolic cardiac arrest following following  anterior cervical fusion. History of carotid stenosis, left femoropopliteal bypass 2016, COPD, dementia, hypertension, hyperlipidemia, and stage IV chronic kidney disease. Working cardiac diagnosis is bradycardia related to vagotonia.  Assessment & Plan    1. Bradycardia-asystolic cardiac arrest likely related to vagotonia. So far, no evidence of myocardial infarction by cardiac markers. No recurrence of bradycardia. Plan is clinical observation for evidence of recurrence. We will repeat an electrocardiogram today. No indication for pacemaker at this point.  Stable from the cardiac viewpoint. Please notify us if we can be of further assistance.  Signed, Sinclair Grooms, MD  01/29/2017, 12:37 PM

## 2017-01-29 NOTE — Progress Notes (Signed)
Admit: 01/22/2017 LOS: 7  44M s/p L3-L5 fusion ant approach,  with VDRF-Hypoxic, Brief PEA arrest, hypotension, HCAP LLL PNA; AoCKD in setting of hypotension/ACEi/NSAID  Subjective: UOP up to 1.1L past 24h  SCr downtrending this AM Weening on vent   02/12 0701 - 02/13 0700 In: 871.3 [I.V.:284; NG/GT:587.3] Out: 1125 [Urine:1125]  Filed Weights   01/27/17 0500 01/28/17 0431 01/29/17 0403  Weight: 112 kg (246 lb 14.6 oz) 113.1 kg (249 lb 5.4 oz) 111.1 kg (244 lb 14.9 oz)    Scheduled Meds: . acetaminophen  1,000 mg Oral Q6H  . aspirin  81 mg Per Tube Daily  . atorvastatin  80 mg Per Tube QHS  . cefTRIAXone (ROCEPHIN)  IV  2 g Intravenous Q24H  . chlorhexidine gluconate (MEDLINE KIT)  15 mL Mouth Rinse BID  . Chlorhexidine Gluconate Cloth  6 each Topical Q0600  . docusate  100 mg Per Tube BID  . feeding supplement (NEPRO CARB STEADY)  1,000 mL Per Tube Q24H  . feeding supplement (PRO-STAT SUGAR FREE 64)  60 mL Per Tube QID  . furosemide  40 mg Intravenous Once  . gabapentin  300 mg Per Tube Q8H  . heparin subcutaneous  5,000 Units Subcutaneous Q8H  . insulin aspart  0-9 Units Subcutaneous TID WC  . mouth rinse  15 mL Mouth Rinse 10 times per day  . pantoprazole (PROTONIX) IV  40 mg Intravenous Q24H  . sennosides  5 mL Per Tube BID  . sodium chloride flush  3 mL Intravenous Q12H  . vancomycin  1,500 mg Intravenous Q48H   Continuous Infusions: . sodium chloride 10 mL/hr at 01/26/17 2000  . sodium chloride    . fentaNYL infusion INTRAVENOUS 75 mcg/hr (01/28/17 2010)  . phenylephrine (NEO-SYNEPHRINE) Adult infusion     PRN Meds:.fentaNYL, midazolam, ondansetron (ZOFRAN) IV, sodium chloride flush  Current Labs: reviewed    Physical Exam:  Blood pressure (!) 115/57, pulse 70, temperature 98.9 F (37.2 C), temperature source Axillary, resp. rate (!) 24, height 5' 9.5" (1.765 m), weight 111.1 kg (244 lb 14.9 oz), SpO2 91 %. Intubated, sedated ETT in place Good air mov't  b/l Foley catheter in place, amber urine abd not tight, quiet BS  A 1. AoCKD in setting of hypotension / anemia / NSAID / ACEi 1. BL SCr 2.2, sees Deterding CKA 2. Lisinopril, lasix, celebrex in perioperative period 3. 2/10 CT A w/o renal obstruction 2. S/p ant Lumbar fusion 01/22/17; prolonged surgery 3. Hypotension 4. VDRF - Hypoxia 5. Bradycardic Arrest 2/9 6. PVD hx/o bypass and iliac stents 7. COPD 8. Hypotension. ? PE on hep gtt 9. HCAP on Vanc/Zosyn 10. HTN on ACEi 11. CAD 71. COPD 48. Obesity 14. Anemia 15. ? Alzheimers by chart review  P 1. Appears stable or perhaps recovering 2. Cont current care 3. Daily weights, Daily Renal Panel, Strict I/Os, Avoid nephrotoxins (NSAIDs, judicious IV Contrast)    Pearson Grippe MD 01/29/2017, 9:47 AM   Recent Labs Lab 01/27/17 0500 01/28/17 0900 01/29/17 0540  NA 138 136 138  K 4.1 4.1 3.6  CL 106 107 108  CO2 19* 18* 19*  GLUCOSE 82 132* 124*  BUN 71* 85* 96*  CREATININE 4.07* 4.84* 4.19*  CALCIUM 7.7* 7.9* 8.4*  PHOS 3.8 4.3 4.6    Recent Labs Lab 01/26/17 1025 01/27/17 0536 01/28/17 0548 01/28/17 0900 01/29/17 0540  WBC 8.5 7.4 9.1 9.5 11.5*  NEUTROABS 5.8 5.3  --   --   --  HGB 8.8* 7.3* 7.2* 7.0* 7.4*  HCT 28.1* 22.8* 22.5* 21.9* 23.2*  MCV 93.0 91.2 90.7 90.9 91.3  PLT 262 293 347 315 374

## 2017-01-29 NOTE — Progress Notes (Signed)
PULMONARY / CRITICAL CARE MEDICINE   Name: Joseph Hebert MRN: [id number] DOB: [birth year]    ADMISSION DATE:  01/22/2017 CONSULTATION DATE:  2/9  REFERRING MD:  NS  CHIEF COMPLAINT:  Hypoxia  HISTORY OF PRESENT ILLNESS:   71 yo male with refractory hypertension, PVD post left fem pop, CAD, Dyspnea on excerption who had anterior abd approach l3-5 fusion.  Early am 2/9 he was hypotensive and hypoxic and RRT was called to bedside. He was transferred to nsicu and promptly improved with stimulation   PMH - DM-2, PAD   STUDIES:  2/9 ct abd>>small rp bleed 2/10 CT chest >> BL effusions 2/10 CT abd/ pelvis >> stable 2/10 head CT >> neg 2/11 echo > EF 65-70%, G1DD, PAP 54. 2/12 LE Duplex >  CULTURES: 2/9 bc x 2>> 2/0 uc>> 2/9 sputum>> Serratia marcescens  ANTIBIOTICS: 2/9 vanc>> 2/12 2/9 Zoysyn>> 2/12 Ceftriaxone 2/12 >  SIGNIFICANT EVENTS: 2/6 lumbar fusion ant abd approach. 2/9 transferred to ICU with hypotension and hypoxia 2/10 intubated , brady arrest  LINES/TUBES: ETT 2/10 >> Lt [place] CVL 2/10 >>  SUBJECTIVE:  Down to 5 PEEP.  CVP 14, edema worse.  UOP improved this AM, 125cc in past 2 hrs.   VITAL SIGNS: BP (!) 115/57   Pulse 70   Temp 98.6 F (37 C) (Axillary)   Resp (!) 24   Ht 5' 9.5" (1.765 m)   Wt 111.1 kg (244 lb 14.9 oz)   SpO2 91%   BMI 35.65 kg/m   HEMODYNAMICS: CVP:  [6 mmHg-25 mmHg] 13 mmHg  VENTILATOR SETTINGS: Vent Mode: PRVC FiO2 (%):  [45 %-60 %] 50 % Set Rate:  [24 bmp] 24 bmp Vt Set:  [580 mL] 580 mL PEEP:  [5 cmH20-14 cmH20] 5 cmH20 Plateau Pressure:  [18 cmH20-20 cmH20] 18 cmH20  INTAKE / OUTPUT: I/O last 3 completed shifts: In: 1756.3 [I.V.:539; NG/GT:1067.3; IV Piggyback:150] Out: 1405 [Urine:1405]  PHYSICAL EXAMINATION: General: Adult male, NAD. Neuro: Sedated, opens eyes, intermittently follows commands. HEENT: [place]/AT. PERRL, sclerae anicteric. Cardiovascular: RRR, no M/R/G.  Lungs: Respirations even and unlabored.  CTA  bilaterally, No W/R/R.  Abdomen: BS x 4, soft, NT/ND.  Musculoskeletal: No gross deformities, 1+ edema.  Skin: Intact, warm, no rashes.     LABS:  BMET  Recent Labs Lab 01/27/17 0500 01/28/17 0900 01/29/17 0540  NA 138 136 138  K 4.1 4.1 3.6  CL 106 107 108  CO2 19* 18* 19*  BUN 71* 85* 96*  CREATININE 4.07* 4.84* 4.19*  GLUCOSE 82 132* 124*    Electrolytes  Recent Labs Lab 01/26/17 1821 01/27/17 0500 01/27/17 0536 01/28/17 0900 01/29/17 0540  CALCIUM  --  7.7*  --  7.9* 8.4*  MG 2.1  --  2.0  --  2.4  PHOS  --  3.8  --  4.3 4.6    CBC  Recent Labs Lab 01/28/17 0548 01/28/17 0900 01/29/17 0540  WBC 9.1 9.5 11.5*  HGB 7.2* 7.0* 7.4*  HCT 22.5* 21.9* 23.2*  PLT 347 315 374    Coag's  Recent Labs Lab 01/23/17 0338  APTT 37*  INR 1.16    Sepsis Markers  Recent Labs Lab 01/26/17 1609  LATICACIDVEN 0.7    ABG  Recent Labs Lab 01/26/17 1614 01/26/17 1656 01/27/17 0820  PHART 7.239* 7.337* 7.351  PCO2ART 46.1 34.4 33.8  PO2ART 74.0* 59.0* 104    Liver Enzymes  Recent Labs Lab 01/26/17 2140 01/27/17 0500 01/28/17 0900  AST 71*  --   --  ALT 12*  --   --   ALKPHOS 61  --   --   BILITOT 0.8  --   --   ALBUMIN 2.0* 1.8* 1.8*    Cardiac Enzymes  Recent Labs Lab 01/26/17 1821 01/26/17 2140 01/27/17 0536  TROPONINI <0.03 0.03* 0.03*    Glucose  Recent Labs Lab 01/28/17 0829 01/28/17 1139 01/28/17 1546 01/28/17 1944 01/28/17 2319 01/29/17 0335  GLUCAP 131* 131* 133* 123* 119* 104*    Imaging Dg Chest Port 1 View  Result Date: 01/29/2017 CLINICAL DATA:  Hypertension, COPD and asthma.  Ventilator support. EXAM: PORTABLE CHEST 1 VIEW COMPARISON:  01/28/2017 FINDINGS: Endotracheal tube tip is 5 cm above the carina. Left subclavian central line tip in the SVC above the right atrium. Nasogastric tube enters the abdomen. There is worsening infiltrate/ volume loss in both lower lobes. There could be a degree of fluid  overload/edema coexistent. IMPRESSION: Lines and tubes well positioned. Worsening of bilateral lower lobe atelectasis and/or infiltrate. There could be coexistent edema/fluid overload as well. Electronically Signed   By: [name] Chimes M.D.   On: 01/29/2017 07:14   Dg Chest Port 1 View  Result Date: 01/28/2017 CLINICAL DATA:  Hypoxemia. EXAM: PORTABLE CHEST 1 VIEW COMPARISON:  CT 01/26/2017. FINDINGS: Endotracheal tube and NG tube in stable position. Left subclavian line stable position . Cardiomegaly with mild bilateral interstitial prominence and bilateral pleural effusions. Findings suggest CHF. Pneumonitis cannot be excluded . IMPRESSION: 1. Lines and tubes in stable position. 2. Cardiomegaly with mild bilateral from interstitial prominence consistent with mild CHF. Mild pneumonitis cannot be excluded . Bilateral small pleural effusions. Electronically Signed   By: Marcello Moores  Register   On: 01/28/2017 10:44       DISCUSSION: 71 yo male with refractory hypertension, PVD post left fem pop, CAD, Dyspnea on excerption who had anterior abd approach l3-5 fusion. Early am 2/9 he was hypotensive and hypoxic and RRT was called to bedside. He was transferred to nsicu and promptly improved with stimulation. He was intubated 2/10 & sustained a brady arrest Extreme hypoxia out of proportion to infx - PE remains a possibility   ASSESSMENT / PLAN:  PULMONARY A: Acute hypoxic resp failure - ARDS range hypoxia.  O2 requirements now improving 02/12 (after heparin; therefore, certainly raises suspicion for PE).  Heparin stopped 02/12 after neg LE duplex and oxygenation somewhat improved 02/13 even with lower PEEP. ? PE - unable to get CT with contrast.  Echo with mildly increased RA pressures and elevated PAP and findings suggestive of elevated CVP > ? PE.  LE duplex done 02/12, neg for DVT. Small b/l pleural effusions with interstitial edema. P:    Continue full vent support. Continue to wean O2, down to 50%  (with 5 PEEP). Not ready for weaning or extubation yet. F/u official read on LE duplex. 40mg  lasix now. CXR in AM.  CARDIOVASCULAR A:  Hypotension in setting of post op lumbar surgery on multiple antihypertensives.  Also some concern obstructive shock from possible PE.  Resolved 02/13; now hypertensive. Volume overload - +4L since admit. CVP 14. P:  Hold antihypertensives till stable. Continue supportive care. Follow CVP's. 40mg  lasix now.  RENAL A:   AoCKD - improved UOP 02/13 (125cc in past 2 hrs). Volume overload - +4L since admit, CVP 14. P:   Nephrology following; hopefully UOP and renal function will continue to improve and can avoid CRRT. 40mg  lasix now. Correct electrolytes as indicated.  GASTROINTESTINAL A:   ?  Melanotic stools - per RN, has had dark colored BM's this AM. Abd distension - resolved. CT abd mild rp hematoma. P:   Assess FOBT. Continue TF's.  HEMATOLOGIC A:   Mild anemia - likely ABLA. P:  Transfuse for Hgb < 7. SCD's. CBC in AM.  INFECTIOUS A:   Aspiration LLL ? Serratia UTI P:   Abx changed to ceftriaxone 02/12. Await final cx data.  ENDOCRINE A:   DM. P:   Monitor labs.  NEUROLOGIC A:   Acute encephalopathy due to sedation. Post 2/6 ant approach L3-5 fusion. P:   RASS goal: 0 Fent gtt / versed PRN. Post op management per neurosurgery.    FAMILY  Wife updated 2/12.  - Inter-disciplinary family meet or Palliative Care meeting due by:  day 7  CC time: 35 min.   Montey Hora, [facility] Pager: [phone]  or [phone] 01/29/2017, 7:56 AM    ATTENDING NOTE / ATTESTATION NOTE :   I have discussed the case with the resident/APP  Montey Hora PA.   I agree with the resident/APP's  history, physical examination, assessment, and plans.    I have edited the above note and modified it according to our agreed history, physical examination, assessment and plan.    Briefly, 71 yo male with refractory hypertension, PVD post left fem pop, CAD, Dyspnea on excerption who had anterior abd approach l3-5 fusion.  Early am 2/9 he was hypotensive and hypoxic and RRT was called to bedside. He was transferred to nsicu. He was intubated.  He went into bradycardia-asytole and severe hypoxemia.  Fio2 slowly being tapered down, now on 45% Fio2 and PEEP 5. Making more urine last 24 hrs.    Vitals:  Vitals:   01/29/17 0500 01/29/17 0600 01/29/17 0739 01/29/17 0800  BP: 119/81 (!) 140/104 (!) 115/57   Pulse: 67 70    Resp: (!) 24 (!) 24    Temp:    98.9 F (37.2 C)  TempSrc:    Axillary  SpO2: 93% 91%    Weight:      Height:        Constitutional/General: well-nourished, well-developed, intubated, sedated, not in any distress  Body mass index is 35.65 kg/m. Wt Readings from Last 3 Encounters:  01/29/17 111.1 kg (244 lb 14.9 oz)  01/16/17 102.7 kg (226 lb 6 oz)  11/21/16 99.2 kg (218 lb 11.1 oz)    HEENT: PERLA, anicteric sclerae. (-) Oral thrush. Intubated, ETT in place  Neck: No masses. Midline trachea. No JVD, (-) LAD. (-) bruits appreciated.  Respiratory/Chest: Grossly normal chest. (-) deformity. (-) Accessory muscle use.  Symmetric expansion. Diminished BS on both lower lung zones. (-) wheezing,  Rhonchi Crackles bibasilar (-) egophony  Cardiovascular: Regular rate and  rhythm, heart sounds normal, no murmur or gallops,  Gr 1 peripheral edema  Gastrointestinal:  Normal bowel sounds. Soft, non-tender. No hepatosplenomegaly.  (-) masses.   Musculoskeletal:  Normal muscle tone.   Extremities: Grossly normal. (-) clubbing, cyanosis.  Gr 1  edema  Skin: (-) rash,lesions seen.   Neurological/Psychiatric : sedated, intubated. CN grossly intact. (-) lateralizing signs.     CBC Recent Labs     01/28/17  0548  01/28/17  0900  01/29/17  0540  WBC  9.1  9.5  11.5*  HGB  7.2*  7.0*  7.4*  HCT  22.5*  21.9*  23.2*  PLT  347  315  374     Coag's No results for input(s): APTT, INR in the last 72 hours.  BMET Recent Labs     01/27/17  0500  01/28/17  0900  01/29/17  0540  NA  138  136  138  K  4.1  4.1  3.6  CL  106  107  108  CO2  19*  18*  19*  BUN  71*  85*  96*  CREATININE  4.07*  4.84*  4.19*  GLUCOSE  82  132*  124*    Electrolytes Recent Labs     01/26/17  1821  01/27/17  0500  01/27/17  0536  01/28/17  0900  01/29/17  0540  CALCIUM   --   7.7*   --   7.9*  8.4*  MG  2.1   --   2.0   --   2.4  PHOS   --   3.8   --   4.3  4.6    Sepsis Markers No results for input(s): PROCALCITON, O2SATVEN in the last 72 hours.  Invalid input(s): LACTICACIDVEN  ABG Recent Labs     01/26/17  1614  01/26/17  1656  01/27/17  0820  PHART  7.239*  7.337*  7.351  PCO2ART  46.1  34.4  33.8  PO2ART  74.0*  59.0*  104    Liver Enzymes Recent Labs     01/26/17  2140  01/27/17  0500  01/28/17  0900  AST  71*   --    --   ALT  12*   --    --   ALKPHOS  61   --    --   BILITOT  0.8   --    --   ALBUMIN  2.0*  1.8*  1.8*    Cardiac Enzymes Recent Labs     01/26/17  1821  01/26/17  2140  01/27/17  0536  TROPONINI  <0.03  0.03*  0.03*    Glucose Recent Labs     01/28/17  1139  01/28/17  1546  01/28/17  1944  01/28/17  2319  01/29/17  0335  01/29/17  0755  GLUCAP  131*  133*  123*  119*  104*  134*    Imaging Dg Chest Port 1 View  Result Date: 01/29/2017 CLINICAL DATA:  Hypertension, COPD and asthma.  Ventilator support. EXAM: PORTABLE CHEST 1 VIEW COMPARISON:  01/28/2017 FINDINGS: Endotracheal tube tip is 5 cm above the carina. Left subclavian central line tip in the SVC above the right atrium. Nasogastric tube enters the abdomen. There is worsening infiltrate/ volume loss in both lower lobes. There could be a degree of fluid overload/edema coexistent. IMPRESSION: Lines and tubes well positioned. Worsening of bilateral lower lobe atelectasis and/or infiltrate. There could be coexistent  edema/fluid overload as well. Electronically Signed   By: [name] Chimes M.D.   On: 01/29/2017 07:14   Dg Chest Port 1 View  Result Date: 01/28/2017 CLINICAL DATA:  Hypoxemia. EXAM: PORTABLE CHEST 1 VIEW COMPARISON:  CT 01/26/2017. FINDINGS: Endotracheal tube and NG tube in stable position. Left subclavian line stable position . Cardiomegaly with mild bilateral interstitial prominence and bilateral pleural effusions. Findings suggest CHF. Pneumonitis cannot be excluded . IMPRESSION: 1. Lines and tubes in stable position. 2. Cardiomegaly with mild bilateral from interstitial prominence consistent with mild CHF. Mild pneumonitis cannot be excluded . Bilateral small pleural effusions. Electronically Signed   By: Marcello Moores  Register   On: 01/28/2017  10:44    Assessment/Plan : Acute Hypoxemic Respiratory Failue 2/2 HCAP with Serratia marcescens on sputum ( RLL/RUL PNA)  + Pulm edema.  VTE was also a consideration over the weekend but less likely as Dopplers were (-) and pt is better on Abx and diuresis.  - cont vent support - PST when able.  - Cont Rocephin.  D/C vancomycin if OK with Neurosurgery.  - starting to diuresce.  CXR today is worse with pulm edema >> agree with lasix IV if OK with renal.    AKI/CKD - Nephrology following.  - [name] diurescing some. CXR worse today with more effusion.  Suggest diuresis if OK with renal.    S/P L3-L5 fusion -per neurosurgery.   I spent  30  minutes of Critical Care time with this patient today. This is my time spent independent of the APP or resident.   Family :  No family at bedside.    Monica Becton, MD 01/29/2017, 9:36 AM [facility] Pager [phone] After 3 pm or if no answer, call [phone]

## 2017-01-29 NOTE — Progress Notes (Signed)
Spoke with Dr.De Larkin Ina reported scrotal edema, right lower extrem cold to touch, failed wean by RT

## 2017-01-30 ENCOUNTER — Inpatient Hospital Stay (HOSPITAL_COMMUNITY): Payer: Medicare Other

## 2017-01-30 DIAGNOSIS — M4727 Other spondylosis with radiculopathy, lumbosacral region: Principal | ICD-10-CM

## 2017-01-30 LAB — BASIC METABOLIC PANEL
ANION GAP: 10 (ref 5–15)
BUN: 101 mg/dL — AB (ref 6–20)
CALCIUM: 8.6 mg/dL — AB (ref 8.9–10.3)
CO2: 20 mmol/L — AB (ref 22–32)
Chloride: 113 mmol/L — ABNORMAL HIGH (ref 101–111)
Creatinine, Ser: 3.23 mg/dL — ABNORMAL HIGH (ref 0.61–1.24)
GFR calc Af Amer: 21 mL/min — ABNORMAL LOW (ref >60)
GFR calc non Af Amer: 18 mL/min — ABNORMAL LOW (ref >60)
GLUCOSE: 128 mg/dL — AB (ref 65–99)
Potassium: 3.8 mmol/L (ref 3.5–5.1)
Sodium: 143 mmol/L (ref 135–145)

## 2017-01-30 LAB — CULTURE, BLOOD (ROUTINE X 2)
CULTURE: NO GROWTH
CULTURE: NO GROWTH

## 2017-01-30 LAB — CBC
HEMATOCRIT: 23.4 % — AB (ref 39.0–52.0)
Hemoglobin: 7.3 g/dL — ABNORMAL LOW (ref 13.0–17.0)
MCH: 29 pg (ref 26.0–34.0)
MCHC: 31.2 g/dL (ref 30.0–36.0)
MCV: 92.9 fL (ref 78.0–100.0)
Platelets: 415 10*3/uL — ABNORMAL HIGH (ref 150–400)
RBC: 2.52 MIL/uL — ABNORMAL LOW (ref 4.22–5.81)
RDW: 16.8 % — AB (ref 11.5–15.5)
WBC: 11.1 10*3/uL — AB (ref 4.0–10.5)

## 2017-01-30 LAB — GLUCOSE, CAPILLARY
GLUCOSE-CAPILLARY: 127 mg/dL — AB (ref 65–99)
Glucose-Capillary: 119 mg/dL — ABNORMAL HIGH (ref 65–99)
Glucose-Capillary: 112 mg/dL — ABNORMAL HIGH (ref 65–99)
Glucose-Capillary: 125 mg/dL — ABNORMAL HIGH (ref 65–99)
Glucose-Capillary: 125 mg/dL — ABNORMAL HIGH (ref 65–99)
Glucose-Capillary: 127 mg/dL — ABNORMAL HIGH (ref 65–99)

## 2017-01-30 LAB — PHOSPHORUS: Phosphorus: 4.1 mg/dL (ref 2.5–4.6)

## 2017-01-30 LAB — MAGNESIUM: Magnesium: 2.4 mg/dL (ref 1.7–2.4)

## 2017-01-30 MED ORDER — INSULIN ASPART 100 UNIT/ML ~~LOC~~ SOLN
2.0000 [IU] | SUBCUTANEOUS | Status: DC
  Administered 2017-01-30 – 2017-02-01 (×13): 2 [IU] via SUBCUTANEOUS
  Administered 2017-02-01: 4 [IU] via SUBCUTANEOUS
  Administered 2017-02-02 (×2): 2 [IU] via SUBCUTANEOUS
  Administered 2017-02-02: 4 [IU] via SUBCUTANEOUS
  Administered 2017-02-03: 6 [IU] via SUBCUTANEOUS

## 2017-01-30 MED ORDER — ETOMIDATE 2 MG/ML IV SOLN
20.0000 mg | Freq: Once | INTRAVENOUS | Status: AC
  Administered 2017-01-30: 20 mg via INTRAVENOUS

## 2017-01-30 MED ORDER — FUROSEMIDE 10 MG/ML IJ SOLN
40.0000 mg | Freq: Once | INTRAMUSCULAR | Status: AC
  Administered 2017-01-30: 40 mg via INTRAVENOUS
  Filled 2017-01-30: qty 4

## 2017-01-30 NOTE — Progress Notes (Signed)
OT Cancellation Note  Patient Details Name: Joseph Hebert MRN: [id number] DOB: 1946/10/12   Cancelled Treatment:    Reason Eval/Treat Not Completed: Medical issues which prohibited therapy  [place], OT/L  [phone] 01/30/2017 01/30/2017, 12:14 PM

## 2017-01-30 NOTE — Progress Notes (Signed)
PT Cancellation Note  Patient Details Name: Joseph Hebert MRN: [id number] DOB: December 20, 1945   Cancelled Treatment:    Reason Eval/Treat Not Completed: Patient not medically ready - Pt desaturating with movement, with suspected PE. Will follow-up when medically appropriate.  Enis Gash, SPT [phone]  Mabeline Caras 01/30/2017, 12:20 PM

## 2017-01-30 NOTE — Progress Notes (Signed)
ETT was removed/exchanged per MD, RRT assisted. Bougie stylet used. Pt was stable throughout procedure no complications noted. MD/NP/RN/RRT at bedside throughout procedure.

## 2017-01-30 NOTE — Progress Notes (Signed)
PULMONARY / CRITICAL CARE MEDICINE   Name: GERALDO HARIS MRN: [id number] DOB: 02-16-1946    ADMISSION DATE:  01/22/2017 CONSULTATION DATE:  01/25/2017  REFERRING MD: Ditty  CHIEF COMPLAINT: Hypoxia   Brief: 71 yo male with PMH of DM, refractory hypertension, PVD post left fem pop, CAD, Dyspnea on excerption who had anterior abd approach l3-5 fusion on 2/6. 2/9 he was hypotensive and hypoxic and transferred to ICU. Intubated 2/10 for respiratory decompensation.   SUBJECTIVE:  ETT exchanged overnight due to blown cuff.   VITAL SIGNS: BP (!) 130/57   Pulse (!) 57   Temp 98.7 F (37.1 C) (Axillary)   Resp (!) 24   Ht 5' 9.5" (1.765 m)   Wt 107.6 kg (237 lb 3.4 oz)   SpO2 96%   BMI 34.53 kg/m   HEMODYNAMICS: CVP:  [6 mmHg-18 mmHg] 13 mmHg  VENTILATOR SETTINGS: Vent Mode: PRVC FiO2 (%):  [45 %-100 %] 50 % Set Rate:  [24 bmp] 24 bmp Vt Set:  [580 mL] 580 mL PEEP:  [5 cmH20-8 cmH20] 8 cmH20 Plateau Pressure:  [18 cmH20-20 cmH20] 20 cmH20  INTAKE / OUTPUT: I/O last 3 completed shifts: In: 1051.3 [I.V.:262.5; NG/GT:788.8] Out: 2825 [Urine:2825]  PHYSICAL EXAMINATION: General: Adult male, no distress, lying in bed  Neuro: Sedated, follows commands, moves all extremities   HEENT: ETT in place  Cardiovascular: Brady, NI S1/S2, no MRG Lungs: Clear, diminished to bases, non-labored, on vent    Abdomen: obese, non-tender, hypoactive bowel sounds  Musculoskeletal: no acute   Skin: warm, dry, intact     LABS:  BMET  Recent Labs Lab 01/28/17 0900 01/29/17 0540 01/30/17 0550  NA 136 138 143  K 4.1 3.6 3.8  CL 107 108 113*  CO2 18* 19* 20*  BUN 85* 96* 101*  CREATININE 4.84* 4.19* 3.23*  GLUCOSE 132* 124* 128*    Electrolytes  Recent Labs Lab 01/27/17 0536 01/28/17 0900 01/29/17 0540 01/30/17 0550  CALCIUM  --  7.9* 8.4* 8.6*  MG 2.0  --  2.4 2.4  PHOS  --  4.3 4.6 4.1    CBC  Recent Labs Lab 01/28/17 0900 01/29/17 0540 01/30/17 0550  WBC 9.5  11.5* 11.1*  HGB 7.0* 7.4* 7.3*  HCT 21.9* 23.2* 23.4*  PLT 315 374 415*    Coag's No results for input(s): APTT, INR in the last 168 hours.  Sepsis Markers  Recent Labs Lab 01/26/17 1609  LATICACIDVEN 0.7    ABG  Recent Labs Lab 01/26/17 1656 01/27/17 0820 01/29/17 1928  PHART 7.337* 7.351 7.371  PCO2ART 34.4 33.8 35.1  PO2ART 59.0* 104 83.0    Liver Enzymes  Recent Labs Lab 01/26/17 2140 01/27/17 0500 01/28/17 0900  AST 71*  --   --   ALT 12*  --   --   ALKPHOS 61  --   --   BILITOT 0.8  --   --   ALBUMIN 2.0* 1.8* 1.8*    Cardiac Enzymes  Recent Labs Lab 01/26/17 1821 01/26/17 2140 01/27/17 0536  TROPONINI <0.03 0.03* 0.03*    Glucose  Recent Labs Lab 01/29/17 1212 01/29/17 1622 01/29/17 1944 01/29/17 2306 01/30/17 0316 01/30/17 0813  GLUCAP 122* 120* 124* 111* 119* 112*    Imaging Dg Chest Port 1 View  Result Date: 01/30/2017 CLINICAL DATA:  Repositioning of endotracheal tube EXAM: PORTABLE CHEST 1 VIEW COMPARISON:  01/29/2017 FINDINGS: Endotracheal tube tip is 4.5 cm above the carina. Left subclavian central line projects within the  mid to distal SVC. Gastric tube is seen extending below the left hemidiaphragm. Mild CHF with confluent airspace opacities at the right lung base may represent a superimposed pneumonia and/or layering right effusion. Heart is top-normal in size. There is aortic atherosclerosis with slight uncoiling of the thoracic aorta. Old left mid clavicular fracture with healing. IMPRESSION: Endotracheal tube tip 4.5 cm above the carina. Gastric tube extends below the left hemidiaphragm. Left subclavian central line in mid to distal SVC. Mild CHF with probable layering right effusion. Superimposed right basilar pneumonia is not entirely excluded. Electronically Signed   By: Ashley Royalty M.D.   On: 01/30/2017 03:02   Dg Abd Portable 1v  Result Date: 01/30/2017 CLINICAL DATA:  NGT placement EXAM: PORTABLE ABDOMEN - 1 VIEW  COMPARISON:  01/26/2017 FINDINGS: NG tube tip is seen in the right hemiabdomen possibly along the expected course of the duodenum bulb or proximal second portion of the duodenum. IMPRESSION: Gastric tube in right hemiabdomen possibly in the expected location of the duodenal bulb or proximal second portion the duodenum. Electronically Signed   By: Ashley Royalty M.D.   On: 01/30/2017 02:57     STUDIES:  2/9 ct abd>>small rp bleed 2/10 CT chest >> BL effusions 2/10 CT abd/ pelvis >> stable 2/10 head CT >> neg 2/11 echo > EF 65-70%, G1DD, PAP 54. 2/12 LE Duplex > neg   CULTURES: 2/9 bc x 2 > Negative  2/9 sputum > Serratia marcescens  ANTIBIOTICS: vanc 2/9 > 2/14 Zosyn 2/9 > 2/12 Ceftriaxone 2/12 >>   SIGNIFICANT EVENTS: 2/6 lumbar fusion ant abd approach. 2/9 transferred to ICU with hypotension and hypoxia 2/10 intubated , brady arrest  LINES/TUBES: ETT 2/10 >> Lt [place] CVL 2/10 >>  DISCUSSION: Early am 2/9 he was hypotensive and hypoxic and RRT was called to bedside. He was intubated 2/10 & sustained a brady arrest   ASSESSMENT / PLAN:  PULMONARY A: Acute Hypoxic Respiratory Failure secondary to suspected PE  -unable to get CT with Contrast, ECHO has increased RA and elevated PAP Small b/l pleural effusions with interstitial edema  Serratia PNA  P:   Vent support Wean as tolerated - remains of 50% Fio2 and 8 Peep Trend CXR   CARDIOVASCULAR A:  Bradycardia  Hypotension  P:  Cardiac Monitoring  Wean Neo to maintain MAP > 65 (Currently off)  Follow CVPs  Continue Lipitor, ASA   RENAL A:   Acute on Chronic Kidney Disease  P:   Nephrology signed off - recommend outpatient follow up  40 meq lasix now  Trend BMP Replace electrolytes as needed   GASTROINTESTINAL A:   No issues  P:   TF per nutrition  PPI  HEMATOLOGIC A:   No issues  P:  Transfuse for Hgb <7 Trend CBC SCDs  INFECTIOUS A:   Serratia PNA  P:   Trend WBC and fever curve  Continue  Ceftriaxone  D/C vancomycin   ENDOCRINE A:   DM P:   Q4H glucose checks SSI   NEUROLOGIC A:   Acute Encephalopathy secondary to sedation  Post L3-L5 Fusion  P:   Management per Neurosurgery  Wean Fentanyl gtt to achieve RASS PRN versed  Continue Neurontin  RASS goal: 0/-1   FAMILY  - Updates: no family at bedside 2/14   - Inter-disciplinary family meet or Palliative Care meeting due by:  Ongoing     Hayden Pedro, AG-ACNP [facility]  Pgr: [phone]  PCCM Pgr: [phone]  ATTENDING NOTE / ATTESTATION NOTE :   I have discussed the case with the resident/APP  Hayden Pedro NP.   I agree with the resident/APP's  history, physical examination, assessment, and plans.    I have edited the above note and modified it according to our agreed history, physical examination, assessment and plan.   Briefly, 71 yo male with refractory hypertension, PVD post left fem pop, CAD, Dyspnea on excerption who had anterior abd approach l3-5 fusion. Early am 2/9 he was hypotensive and hypoxic and RRT was called to bedside. He was transferred to nsicu. He was intubated. He went into bradycardia-asytole and severe hypoxemia.  Fio2 slowly being tapered down. [place].   Last 24 hrs, had ETT changed as his ETT cuff was busted. Tolerated change.  Failed PST this am. He was desaturating.   Vitals:  Vitals:   01/30/17 1000 01/30/17 1100 01/30/17 1132 01/30/17 1200  BP: (!) 130/57 (!) 110/52 (!) 110/52 (!) 136/55  Pulse: (!) 57 (!) 58 62 63  Resp: (!) 24 (!) 24 (!) 24 (!) 22  Temp:      TempSrc:      SpO2: 96% 95% 96% 96%  Weight:      Height:        Constitutional/General: well-nourished, well-developed, intubated, sedated, not in any distress  Body mass index is 34.53 kg/m. Wt Readings from Last 3 Encounters:  01/30/17 107.6 kg (237 lb 3.4 oz)  01/16/17 102.7 kg (226 lb 6 oz)  11/21/16 99.2 kg (218 lb 11.1 oz)    HEENT: PERLA, anicteric  sclerae. (-) Oral thrush. Intubated, ETT in place  Neck: No masses. Midline trachea. No JVD, (-) LAD. (-) bruits appreciated.  Respiratory/Chest: Grossly normal chest. (-) deformity. (-) Accessory muscle use.  Symmetric expansion. Diminished BS on both lower lung zones. (-) wheezing, rhonchi. Crackles at bases, less today.  (-) egophony  Cardiovascular: Regular rate and  rhythm, heart sounds normal, no murmur or gallops,  Gr 2 peripheral edema  Gastrointestinal:  Normal bowel sounds. Soft, non-tender. No hepatosplenomegaly.  (-) masses.   Musculoskeletal:  Normal muscle tone.   Extremities: Grossly normal. (-) clubbing, cyanosis.  Gr 2 edema  Skin: (-) rash,lesions seen.   Neurological/Psychiatric : sedated, intubated. CN grossly intact. (-) lateralizing signs.     CBC Recent Labs     01/28/17  0900  01/29/17  0540  01/30/17  0550  WBC  9.5  11.5*  11.1*  HGB  7.0*  7.4*  7.3*  HCT  21.9*  23.2*  23.4*  PLT  315  374  415*    Coag's No results for input(s): APTT, INR in the last 72 hours.  BMET Recent Labs     01/28/17  0900  01/29/17  0540  01/30/17  0550  NA  136  138  143  K  4.1  3.6  3.8  CL  107  108  113*  CO2  18*  19*  20*  BUN  85*  96*  101*  CREATININE  4.84*  4.19*  3.23*  GLUCOSE  132*  124*  128*    Electrolytes Recent Labs     01/28/17  0900  01/29/17  0540  01/30/17  0550  CALCIUM  7.9*  8.4*  8.6*  MG   --   2.4  2.4  PHOS  4.3  4.6  4.1    Sepsis Markers No results for input(s): PROCALCITON, O2SATVEN in the last 72 hours.  Invalid  input(s): LACTICACIDVEN  ABG Recent Labs     01/29/17  1928  PHART  7.371  PCO2ART  35.1  PO2ART  83.0    Liver Enzymes Recent Labs     01/28/17  0900  ALBUMIN  1.8*    Cardiac Enzymes No results for input(s): TROPONINI, PROBNP in the last 72 hours.  Glucose Recent Labs     01/29/17  1622  01/29/17  1944  01/29/17  2306  01/30/17  0316  01/30/17  0813  01/30/17  1125   GLUCAP  120*  124*  111*  119*  112*  125*    Imaging Dg Chest Port 1 View  Result Date: 01/30/2017 CLINICAL DATA:  Repositioning of endotracheal tube EXAM: PORTABLE CHEST 1 VIEW COMPARISON:  01/29/2017 FINDINGS: Endotracheal tube tip is 4.5 cm above the carina. Left subclavian central line projects within the mid to distal SVC. Gastric tube is seen extending below the left hemidiaphragm. Mild CHF with confluent airspace opacities at the right lung base may represent a superimposed pneumonia and/or layering right effusion. Heart is top-normal in size. There is aortic atherosclerosis with slight uncoiling of the thoracic aorta. Old left mid clavicular fracture with healing. IMPRESSION: Endotracheal tube tip 4.5 cm above the carina. Gastric tube extends below the left hemidiaphragm. Left subclavian central line in mid to distal SVC. Mild CHF with probable layering right effusion. Superimposed right basilar pneumonia is not entirely excluded. Electronically Signed   By: Ashley Royalty M.D.   On: 01/30/2017 03:02   Dg Chest Port 1 View  Result Date: 01/29/2017 CLINICAL DATA:  Hypertension, COPD and asthma.  Ventilator support. EXAM: PORTABLE CHEST 1 VIEW COMPARISON:  01/28/2017 FINDINGS: Endotracheal tube tip is 5 cm above the carina. Left subclavian central line tip in the SVC above the right atrium. Nasogastric tube enters the abdomen. There is worsening infiltrate/ volume loss in both lower lobes. There could be a degree of fluid overload/edema coexistent. IMPRESSION: Lines and tubes well positioned. Worsening of bilateral lower lobe atelectasis and/or infiltrate. There could be coexistent edema/fluid overload as well. Electronically Signed   By: [name] Chimes M.D.   On: 01/29/2017 07:14   Dg Abd Portable 1v  Result Date: 01/30/2017 CLINICAL DATA:  NGT placement EXAM: PORTABLE ABDOMEN - 1 VIEW COMPARISON:  01/26/2017 FINDINGS: NG tube tip is seen in the right hemiabdomen possibly along the expected course  of the duodenum bulb or proximal second portion of the duodenum. IMPRESSION: Gastric tube in right hemiabdomen possibly in the expected location of the duodenal bulb or proximal second portion the duodenum. Electronically Signed   By: Ashley Royalty M.D.   On: 01/30/2017 02:57     Assessment/Plan : Acute Hypoxemic Respiratory Failue 2/2 HCAP with Serratia marcescens on sputum ( RLL/RUL PNA) + Pulm edema. VTE was also a consideration over the weekend but less likely as Dopplers were (-) and pt is better on Abx and diuresis.  - cont vent support - PST when able.  - Cont Rocephin.   - continue with diuresis as tolerated.   AKI/CKD - Nephrology following and signed off 2/14.  - Cont lasix 40 mg IV daily. UO significantly improved.  Will be gently diurescing as BUN/creat are elevated.    S/P L3-L5 fusion -per neurosurgery.  -cont TF.   I spent 30 minutes of Critical Care time with this patient today. This is my time spent independent of the APP or resident.   Family :  No family at bedside.  Monica Becton, MD 01/30/2017, 12:17 PM [facility] Pager [phone] After 3 pm or if no answer, call [phone]

## 2017-01-30 NOTE — Progress Notes (Signed)
Admit: 01/22/2017 LOS: 8  35M s/p L3-L5 fusion ant approach,  with VDRF-Hypoxic, Brief PEA arrest, hypotension, HCAP LLL PNA; AoCKD in setting of hypotension/ACEi/NSAID  Subjective: Ongoing good UOP and downtrendign SCr BUN up, on nepro TFs SCr downtrending this AM    02/13 0701 - 02/14 0700 In: 656.3 [I.V.:180; NG/GT:476.3] Out: 2125 [Urine:2125]  Filed Weights   01/28/17 0431 01/29/17 0403 01/30/17 0422  Weight: 113.1 kg (249 lb 5.4 oz) 111.1 kg (244 lb 14.9 oz) 107.6 kg (237 lb 3.4 oz)    Scheduled Meds: . acetaminophen  1,000 mg Oral Q6H  . aspirin  81 mg Per Tube Daily  . atorvastatin  80 mg Per Tube QHS  . cefTRIAXone (ROCEPHIN)  IV  2 g Intravenous Q24H  . chlorhexidine gluconate (MEDLINE KIT)  15 mL Mouth Rinse BID  . Chlorhexidine Gluconate Cloth  6 each Topical Q0600  . docusate  100 mg Per Tube BID  . feeding supplement (NEPRO CARB STEADY)  1,000 mL Per Tube Q24H  . feeding supplement (PRO-STAT SUGAR FREE 64)  60 mL Per Tube QID  . gabapentin  300 mg Per Tube Q8H  . heparin subcutaneous  5,000 Units Subcutaneous Q8H  . insulin aspart  0-9 Units Subcutaneous TID WC  . mouth rinse  15 mL Mouth Rinse 10 times per day  . pantoprazole (PROTONIX) IV  40 mg Intravenous Q24H  . sennosides  5 mL Per Tube BID  . sodium chloride flush  3 mL Intravenous Q12H  . vancomycin  1,500 mg Intravenous Q48H   Continuous Infusions: . sodium chloride 10 mL/hr at 01/26/17 2000  . sodium chloride    . fentaNYL infusion INTRAVENOUS 75 mcg/hr (01/30/17 0800)  . phenylephrine (NEO-SYNEPHRINE) Adult infusion     PRN Meds:.fentaNYL, midazolam, ondansetron (ZOFRAN) IV, sodium chloride flush  Current Labs: reviewed    Physical Exam:  Blood pressure (!) 119/56, pulse (!) 57, temperature 98.7 F (37.1 C), temperature source Axillary, resp. rate (!) 24, height 5' 9.5" (1.765 m), weight 107.6 kg (237 lb 3.4 oz), SpO2 96 %. Intubated, sedated ETT in place Good air mov't b/l Foley catheter  in place, amber urine abd not tight, quiet BS  A 1. Resolving AoCKD in setting of hypotension / anemia / NSAID / ACEi 1. BL SCr 2.2, sees Deterding CKA 2. Lisinopril, lasix, celebrex in perioperative period 3. 2/10 CT A w/o renal obstruction 2. S/p ant Lumbar fusion 01/22/17; prolonged surgery 3. Hypotension 4. VDRF - Hypoxia 5. Bradycardic Arrest 2/9 6. PVD hx/o bypass and iliac stents 7. COPD 8. Hypotension. ? PE on hep gtt 9. HCAP on Vanc/Zosyn 10. HTN on ACEi 11. CAD 50. COPD 33. Obesity 14. Anemia 15. ? Alzheimers by chart review  P 1. Clearly regaining GFR 2. Will sign off for now.  Please call with any questions or concerns.  Pt will need follow up with nephrology and I will arrange  3. Daily weights, Daily Renal Panel, Strict I/Os, Avoid nephrotoxins (NSAIDs, judicious IV Contrast)    Pearson Grippe MD 01/30/2017, 9:05 AM   Recent Labs Lab 01/28/17 0900 01/29/17 0540 01/30/17 0550  NA 136 138 143  K 4.1 3.6 3.8  CL 107 108 113*  CO2 18* 19* 20*  GLUCOSE 132* 124* 128*  BUN 85* 96* 101*  CREATININE 4.84* 4.19* 3.23*  CALCIUM 7.9* 8.4* 8.6*  PHOS 4.3 4.6 4.1    Recent Labs Lab 01/26/17 1025 01/27/17 0536  01/28/17 0900 01/29/17 0540 01/30/17 0550  WBC  8.5 7.4  < > 9.5 11.5* 11.1*  NEUTROABS 5.8 5.3  --   --   --   --   HGB 8.8* 7.3*  < > 7.0* 7.4* 7.3*  HCT 28.1* 22.8*  < > 21.9* 23.2* 23.4*  MCV 93.0 91.2  < > 90.9 91.3 92.9  PLT 262 293  < > 315 374 415*  < > = values in this interval not displayed.

## 2017-01-30 NOTE — Progress Notes (Signed)
Pt seen and examined. Had to have ET tube replaced overnight.  EXAM: Temp:  [98.6 F (37 C)-99.6 F (37.6 C)] 98.7 F (37.1 C) (02/14 0800) Pulse Rate:  [53-70] 57 (02/14 0800) Resp:  [15-24] 24 (02/14 0800) BP: (87-172)/(44-73) 119/56 (02/14 0800) SpO2:  [89 %-100 %] 96 % (02/14 0800) Arterial Line BP: (126-214)/(38-58) 188/52 (02/14 0800) FiO2 (%):  [40 %-100 %] 50 % (02/14 0735) Weight:  [107.6 kg (237 lb 3.4 oz)] 107.6 kg (237 lb 3.4 oz) (02/14 0422) Intake/Output      02/13 0701 - 02/14 0700 02/14 0701 - 02/15 0700   I.V. (mL/kg) 180 (1.7) 15 (0.1)   NG/GT 476.3 30   Total Intake(mL/kg) 656.3 (6.1) 45 (0.4)   Urine (mL/kg/hr) 2125 (0.8)    Stool 0 (0)    Total Output 2125     Net -1468.8 +45        Stool Occurrence 4 x     Intubated, sedated, eyes open Follows commands throughout Full strength  Stable Continue current care

## 2017-01-30 NOTE — Procedures (Signed)
Intubation Procedure Note Joseph Hebert [id number] [birth year]  Procedure: Intubation Indications: ETT needed to be changed  Procedure Details Consent: Unable to obtain consent because of altered level of consciousness. Time Out: Verified patient identification, verified procedure, site/side was marked, verified correct patient position, special equipment/implants available, medications/allergies/relevent history reviewed, required imaging and test results available.  Performed  ETT changed over bougie. Good color change on end tidal O2 B/L breath sounds auscultated   Evaluation Hemodynamic Status: BP stable throughout; O2 sats: stable throughout Patient's Current Condition: stable Complications: No apparent complications Patient did tolerate procedure well. Chest X-ray ordered to verify placement.  CXR: pending.   Joseph Hebert 01/30/2017

## 2017-01-31 ENCOUNTER — Inpatient Hospital Stay (HOSPITAL_COMMUNITY): Payer: Medicare Other

## 2017-01-31 LAB — GLUCOSE, CAPILLARY
GLUCOSE-CAPILLARY: 133 mg/dL — AB (ref 65–99)
Glucose-Capillary: 125 mg/dL — ABNORMAL HIGH (ref 65–99)
Glucose-Capillary: 122 mg/dL — ABNORMAL HIGH (ref 65–99)
Glucose-Capillary: 120 mg/dL — ABNORMAL HIGH (ref 65–99)
Glucose-Capillary: 130 mg/dL — ABNORMAL HIGH (ref 65–99)
Glucose-Capillary: 129 mg/dL — ABNORMAL HIGH (ref 65–99)

## 2017-01-31 LAB — CBC
HCT: 23.4 % — ABNORMAL LOW (ref 39.0–52.0)
Hemoglobin: 7.3 g/dL — ABNORMAL LOW (ref 13.0–17.0)
MCH: 29.6 pg (ref 26.0–34.0)
MCHC: 31.2 g/dL (ref 30.0–36.0)
MCV: 94.7 fL (ref 78.0–100.0)
Platelets: 438 10*3/uL — ABNORMAL HIGH (ref 150–400)
RBC: 2.47 MIL/uL — ABNORMAL LOW (ref 4.22–5.81)
RDW: 17.4 % — AB (ref 11.5–15.5)
WBC: 11.3 10*3/uL — ABNORMAL HIGH (ref 4.0–10.5)

## 2017-01-31 LAB — BASIC METABOLIC PANEL
Anion gap: 9 (ref 5–15)
BUN: 104 mg/dL — ABNORMAL HIGH (ref 6–20)
CALCIUM: 8.9 mg/dL (ref 8.9–10.3)
CO2: 23 mmol/L (ref 22–32)
CREATININE: 2.92 mg/dL — AB (ref 0.61–1.24)
Chloride: 118 mmol/L — ABNORMAL HIGH (ref 101–111)
GFR calc Af Amer: 23 mL/min — ABNORMAL LOW (ref >60)
GFR, EST NON AFRICAN AMERICAN: 20 mL/min — AB (ref >60)
GLUCOSE: 125 mg/dL — AB (ref 65–99)
Potassium: 3.6 mmol/L (ref 3.5–5.1)
Sodium: 150 mmol/L — ABNORMAL HIGH (ref 135–145)

## 2017-01-31 LAB — PHOSPHORUS: PHOSPHORUS: 3.7 mg/dL (ref 2.5–4.6)

## 2017-01-31 LAB — MAGNESIUM: MAGNESIUM: 2.5 mg/dL — AB (ref 1.7–2.4)

## 2017-01-31 MED ORDER — FREE WATER
200.0000 mL | Freq: Four times a day (QID) | Status: DC
  Administered 2017-01-31 – 2017-02-02 (×8): 200 mL

## 2017-01-31 MED ORDER — FENTANYL CITRATE (PF) 100 MCG/2ML IJ SOLN: 50.0000 ug | INTRAMUSCULAR | Status: DC | PRN

## 2017-01-31 MED ORDER — FENTANYL CITRATE (PF) 100 MCG/2ML IJ SOLN
50.0000 ug | INTRAMUSCULAR | Status: DC | PRN
  Administered 2017-02-01 – 2017-02-03 (×6): 50 ug via INTRAVENOUS
  Filled 2017-01-31 (×6): qty 2

## 2017-01-31 NOTE — Progress Notes (Addendum)
Pt seen and examined. No issues overnight.  EXAM: Temp:  [98.8 F (37.1 C)-100 F (37.8 C)] 98.8 F (37.1 C) (02/15 0800) Pulse Rate:  [55-88] 55 (02/15 0810) Resp:  [10-25] 24 (02/15 0810) BP: (99-157)/(46-100) 125/51 (02/15 0600) SpO2:  [90 %-100 %] 96 % (02/15 0810) Arterial Line BP: (168-178)/(44-52) 178/52 (02/14 1200) FiO2 (%):  [40 %-55 %] 40 % (02/15 0810) Weight:  [106.2 kg (234 lb 2.1 oz)] 106.2 kg (234 lb 2.1 oz) (02/15 0500) Intake/Output      02/14 0701 - 02/15 0700 02/15 0701 - 02/16 0700   I.V. (mL/kg) 180 (1.7)    NG/GT 360    Total Intake(mL/kg) 540 (5.1)    Urine (mL/kg/hr) 2250 (0.9)    Stool 0 (0)    Total Output 2250     Net -1710          Stool Occurrence 3 x     Awake and alert Follows commands throughout Full strength Wounds with a little erythema but does not appear to be cellulitis  Stable No acute surgical issues Medical issues predominate Appreciate PCCM and nephrology for their assistance

## 2017-01-31 NOTE — Progress Notes (Signed)
PULMONARY / CRITICAL CARE MEDICINE   Name: Joseph Hebert MRN: [id number] DOB: February 28, 1946    ADMISSION DATE:  01/22/2017 CONSULTATION DATE:  01/25/2017  REFERRING MD:  Dr. Cyndy Freeze  CHIEF COMPLAINT:  Hypoxia   Brief:   71 yo male with PMH of DM, refractory hypertension, PVD post left fem pop, CAD, Dyspnea on excerption who had anterior abd approach l3-5 fusion on 2/6. 2/9 he was hypotensive and hypoxic and transferred to ICU. Intubated 2/10 for respiratory decompensation  SUBJECTIVE:  Remains on fentanyl gtt. No events overnight. Increase in BUN and NA  VITAL SIGNS: BP (!) 118/50   Pulse (!) 55   Temp 98.8 F (37.1 C) (Axillary)   Resp (!) 24   Ht 5' 9.5" (1.765 m)   Wt 106.2 kg (234 lb 2.1 oz)   SpO2 97%   BMI 34.08 kg/m   HEMODYNAMICS: CVP:  [1 mmHg-15 mmHg] 11 mmHg  VENTILATOR SETTINGS: Vent Mode: PRVC FiO2 (%):  [40 %-55 %] 40 % Set Rate:  [24 bmp] 24 bmp Vt Set:  [580 mL] 580 mL PEEP:  [5 cmH20-8 cmH20] 8 cmH20 Pressure Support:  [8 cmH20] 8 cmH20 Plateau Pressure:  [17 cmH20-20 cmH20] 20 cmH20  INTAKE / OUTPUT: I/O last 3 completed shifts: In: 787.5 [I.V.:262.5; NG/GT:525] Out: 3300 [Urine:3300]  PHYSICAL EXAMINATION: General:  Adult male, no distress, lying in bed  Neuro:  Alert, follows commands, moves extremities  HEENT:  ETT in place Cardiovascular:  RRR, no MRG, NI S1/S2 Lungs:  Clear, diminished at bases, unlabored  Abdomen: obese, active bowel sounds, non-tender Musculoskeletal: no acute  Skin: Warm, dry, intact   LABS:  BMET  Recent Labs Lab 01/29/17 0540 01/30/17 0550 01/31/17 0520  NA 138 143 150*  K 3.6 3.8 3.6  CL 108 113* 118*  CO2 19* 20* 23  BUN 96* 101* 104*  CREATININE 4.19* 3.23* 2.92*  GLUCOSE 124* 128* 125*    Electrolytes  Recent Labs Lab 01/29/17 0540 01/30/17 0550 01/31/17 0520  CALCIUM 8.4* 8.6* 8.9  MG 2.4 2.4 2.5*  PHOS 4.6 4.1 3.7    CBC  Recent Labs Lab 01/29/17 0540 01/30/17 0550 01/31/17 0520   WBC 11.5* 11.1* 11.3*  HGB 7.4* 7.3* 7.3*  HCT 23.2* 23.4* 23.4*  PLT 374 415* 438*    Coag's No results for input(s): APTT, INR in the last 168 hours.  Sepsis Markers  Recent Labs Lab 01/26/17 1609  LATICACIDVEN 0.7    ABG  Recent Labs Lab 01/26/17 1656 01/27/17 0820 01/29/17 1928  PHART 7.337* 7.351 7.371  PCO2ART 34.4 33.8 35.1  PO2ART 59.0* 104 83.0    Liver Enzymes  Recent Labs Lab 01/26/17 2140 01/27/17 0500 01/28/17 0900  AST 71*  --   --   ALT 12*  --   --   ALKPHOS 61  --   --   BILITOT 0.8  --   --   ALBUMIN 2.0* 1.8* 1.8*    Cardiac Enzymes  Recent Labs Lab 01/26/17 1821 01/26/17 2140 01/27/17 0536  TROPONINI <0.03 0.03* 0.03*    Glucose  Recent Labs Lab 01/30/17 1125 01/30/17 1533 01/30/17 1940 01/30/17 2311 01/31/17 0313 01/31/17 0813  GLUCAP 125* 125* 127* 127* 125* 122*    Imaging Dg Chest Port 1 View  Result Date: 01/31/2017 CLINICAL DATA:  Hypoxia EXAM: PORTABLE CHEST 1 VIEW COMPARISON:  January 30, 2017 FINDINGS: Endotracheal tube tip is 5.6 cm above the carina. Central catheter tip is in the superior vena cava. Nasogastric  tube tip and side port below the diaphragm. No pneumothorax. There are small pleural effusions bilaterally with bibasilar atelectasis. Heart is upper normal in size with pulmonary vascularity within normal limits. No adenopathy. There is an old healed fracture of the left clavicle. IMPRESSION: Tube and catheter positions as described without pneumothorax. Small pleural effusions bilaterally. Question residua of recent congestive heart failure. No edema currently present. There is bibasilar atelectatic change. Heart upper normal in size. Electronically Signed   By: Lowella Grip III M.D.   On: 01/31/2017 07:24     STUDIES: 2/9 ct abd>>small rp bleed 2/10 CT chest >> BL effusions 2/10 CT abd/ pelvis >> stable 2/10 head CT >> neg 2/11 echo > EF 65-70%, G1DD, PAP 54. 2/12 LE Duplex > neg    CULTURES: 2/9 bc x 2 > Negative  2/9 sputum > Serratia marcescens  ANTIBIOTICS: vanc 2/9 >2/14 Zosyn 2/9 > 2/12 Ceftriaxone 2/12 >>   SIGNIFICANT EVENTS: 2/6 lumbar fusion ant abd approach. 2/9 transferred to ICU with hypotension and hypoxia 2/10 intubated , brady arrest  LINES/TUBES: ETT 2/10 >> Lt [place] CVL 2/10 >>  DISCUSSION: Early am 2/9 he was hypotensive and hypoxic and RRT was called to bedside. He was intubated 2/10 & sustained a brady arrest   ASSESSMENT / PLAN:  PULMONARY A: Acute Hypoxic Respiratory Failure Secondary to ?PE vs Pulmonary Edema -unable to get CT with Contrast, ECHO has increased RA and elevated PAP Small b/l pleural effusions with interstitial edema  Serratia PNA  P:   Vent Support  Wean as tolerated - currently on 40/8, did not tolerate wean this AM Trend CXR  CARDIOVASCULAR A:  Hypotension - improving  P:  Cardiac Monitoring  Maintain MAP >65 - currently off neo  Continue Lipitor, ASA   RENAL A:   Acute on Chronic Kidney Disease  Hypernatremia  P:   Trend BMP Replace electrolytes as needed  Hold off on Diuresis today   GASTROINTESTINAL A:   No issues  P:   Continue TF PPI  HEMATOLOGIC A:   No issues  P:  Transfuse for Hgb <7 Trend CBC SCDs  INFECTIOUS A:   Serratia PNA P:   Trend WBC and Fever curve  Continue Ceftriaxone   ENDOCRINE A:   DM P:   SSI  Q4H glucose checks   NEUROLOGIC A:   Acute Encephalopathy secondary to sedation  Post L3-L5 Fusion  P:   Management per Neurosurgery  Wean Fentanyl gtt to achieve RASS > Turn off and try to only do fentanyl pushes  Continue Neurontin  RASS goal: 0  FAMILY  - Updates: no family at bedside 2/15  - Inter-disciplinary family meet or Palliative Care meeting due by:  Ongoing    Hayden Pedro, AG-ACNP [place]  Pgr: [phone]  PCCM Pgr: [phone]  ATTENDING NOTE / ATTESTATION NOTE :   I have discussed the  case with the resident/APP Hayden Pedro NP.   I agree with the resident/APP's  history, physical examination, assessment, and plans.    I have edited the above note and modified it according to our agreed history, physical examination, assessment and plan.   Briefly, 71 yo male with refractory hypertension, PVD post left fem pop, CAD, Dyspnea on excertion who had anterior abd approach L3-5 fusion. Early am 2/9 he was hypotensive and hypoxic and RRT was called to bedside. He was transferred to nsicu. He was intubated. He went into bradycardia-asytole and severe hypoxemia. Eventually diagnosed with  HCAP and pulm edema.  Fio2 slowly being tapered down. [place].   Failing PST as she is sedated and very weak. On less FiO2.  Making urine.   Vitals:  Vitals:   01/31/17 0800 01/31/17 0810 01/31/17 0900 01/31/17 1100  BP:   (!) 118/50 (!) 119/49  Pulse:  (!) 55 (!) 55 65  Resp:  (!) 24 (!) 24 (!) 24  Temp: 98.8 F (37.1 C)     TempSrc: Axillary     SpO2:  96% 97% 93%  Weight:      Height:        Constitutional/General: well-nourished, well-developed, intubated, not in any distress. Very sleepy and weak. Poor cough.   Body mass index is 34.08 kg/m. Wt Readings from Last 3 Encounters:  01/31/17 106.2 kg (234 lb 2.1 oz)  01/16/17 102.7 kg (226 lb 6 oz)  11/21/16 99.2 kg (218 lb 11.1 oz)    HEENT: PERLA, anicteric sclerae. (-) Oral thrush. Intubated, ETT in place  Neck: No masses. Midline trachea. No JVD, (-) LAD. (-) bruits appreciated.  Respiratory/Chest: Grossly normal chest. (-) deformity. (-) Accessory muscle use.  Symmetric expansion. Diminished BS on both lower lung zones. (-) wheezing, rhonchi. Crackles bibasilar.  (-) egophony  Cardiovascular: Regular rate and  rhythm, heart sounds normal, no murmur or gallops,  Gr 2 peripheral edema  Gastrointestinal:  Normal bowel sounds. Soft, non-tender. No hepatosplenomegaly.  (-) masses.   Musculoskeletal:  Normal muscle  tone.   Extremities: Grossly normal. (-) clubbing, cyanosis.  (-) edema  Skin: (-) rash,lesions seen.   Neurological/Psychiatric : sedated, intubated. CN grossly intact. (-) lateralizing signs.    CBC Recent Labs     01/29/17  0540  01/30/17  0550  01/31/17  0520  WBC  11.5*  11.1*  11.3*  HGB  7.4*  7.3*  7.3*  HCT  23.2*  23.4*  23.4*  PLT  374  415*  438*    Coag's No results for input(s): APTT, INR in the last 72 hours.  BMET Recent Labs     01/29/17  0540  01/30/17  0550  01/31/17  0520  NA  138  143  150*  K  3.6  3.8  3.6  CL  108  113*  118*  CO2  19*  20*  23  BUN  96*  101*  104*  CREATININE  4.19*  3.23*  2.92*  GLUCOSE  124*  128*  125*    Electrolytes Recent Labs     01/29/17  0540  01/30/17  0550  01/31/17  0520  CALCIUM  8.4*  8.6*  8.9  MG  2.4  2.4  2.5*  PHOS  4.6  4.1  3.7    Sepsis Markers No results for input(s): PROCALCITON, O2SATVEN in the last 72 hours.  Invalid input(s): LACTICACIDVEN  ABG Recent Labs     01/29/17  1928  PHART  7.371  PCO2ART  35.1  PO2ART  83.0    Liver Enzymes No results for input(s): AST, ALT, ALKPHOS, BILITOT, ALBUMIN in the last 72 hours.  Cardiac Enzymes No results for input(s): TROPONINI, PROBNP in the last 72 hours.  Glucose Recent Labs     01/30/17  1125  01/30/17  1533  01/30/17  1940  01/30/17  2311  01/31/17  0313  01/31/17  0813  GLUCAP  125*  125*  127*  127*  125*  122*    Imaging Dg Chest Port 1 View  Result  Date: 01/31/2017 CLINICAL DATA:  Hypoxia EXAM: PORTABLE CHEST 1 VIEW COMPARISON:  January 30, 2017 FINDINGS: Endotracheal tube tip is 5.6 cm above the carina. Central catheter tip is in the superior vena cava. Nasogastric tube tip and side port below the diaphragm. No pneumothorax. There are small pleural effusions bilaterally with bibasilar atelectasis. Heart is upper normal in size with pulmonary vascularity within normal limits. No adenopathy. There is an old healed  fracture of the left clavicle. IMPRESSION: Tube and catheter positions as described without pneumothorax. Small pleural effusions bilaterally. Question residua of recent congestive heart failure. No edema currently present. There is bibasilar atelectatic change. Heart upper normal in size. Electronically Signed   By: Lowella Grip III M.D.   On: 01/31/2017 07:24   Dg Chest Port 1 View  Result Date: 01/30/2017 CLINICAL DATA:  Repositioning of endotracheal tube EXAM: PORTABLE CHEST 1 VIEW COMPARISON:  01/29/2017 FINDINGS: Endotracheal tube tip is 4.5 cm above the carina. Left subclavian central line projects within the mid to distal SVC. Gastric tube is seen extending below the left hemidiaphragm. Mild CHF with confluent airspace opacities at the right lung base may represent a superimposed pneumonia and/or layering right effusion. Heart is top-normal in size. There is aortic atherosclerosis with slight uncoiling of the thoracic aorta. Old left mid clavicular fracture with healing. IMPRESSION: Endotracheal tube tip 4.5 cm above the carina. Gastric tube extends below the left hemidiaphragm. Left subclavian central line in mid to distal SVC. Mild CHF with probable layering right effusion. Superimposed right basilar pneumonia is not entirely excluded. Electronically Signed   By: Ashley Royalty M.D.   On: 01/30/2017 03:02   Dg Abd Portable 1v  Result Date: 01/30/2017 CLINICAL DATA:  NGT placement EXAM: PORTABLE ABDOMEN - 1 VIEW COMPARISON:  01/26/2017 FINDINGS: NG tube tip is seen in the right hemiabdomen possibly along the expected course of the duodenum bulb or proximal second portion of the duodenum. IMPRESSION: Gastric tube in right hemiabdomen possibly in the expected location of the duodenal bulb or proximal second portion the duodenum. Electronically Signed   By: Ashley Royalty M.D.   On: 01/30/2017 02:57     Assessment/Plan : Acute Hypoxemic Respiratory Failue 2/2 HCAP with Serratia marcescens on sputum  ( RLL/RUL PNA) + Pulm edema.  - cont vent support - PST when able. He is very sleepy and sedated and has a weak cough and deconditioned.  I hope it is just sedation.  - Plan to d/c fentanyl drip. Cont with versed and fentanyl pushes for now.   - Cont Rocephin. Need to determine if Serratia is sensitive to zosyn as he was on zosyn prior to rocephin.  - holding off on diuresis   AKI/CKD. Now with hyperNa and hyperCl - Nephrology following and signed off 2/14.  - I think he has diuresced well. Will hold off on diuresis today as BUN, Na, Cl are higher.  - start free water.    S/P L3-L5 fusion -per neurosurgery.  -cont TF. Add free water.    Need for sedation - try to d/c fentanyl drip and do fentanyl pushes and versed pushes.     I spent 30 minutes of Critical Care time with this patient today. This is my time spent independent of the APP or resident.   Family : Wife extensively updated at bedside. She agrees to a trache if needed.    Monica Becton, MD 01/31/2017, 11:37 AM [facility] Pager [phone]  After 3 pm or if no answer, call [phone]

## 2017-02-01 ENCOUNTER — Inpatient Hospital Stay (HOSPITAL_COMMUNITY): Payer: Medicare Other

## 2017-02-01 DIAGNOSIS — J810 Acute pulmonary edema: Secondary | ICD-10-CM

## 2017-02-01 LAB — BASIC METABOLIC PANEL
Anion gap: 9 (ref 5–15)
BUN: 108 mg/dL — AB (ref 6–20)
CALCIUM: 9.1 mg/dL (ref 8.9–10.3)
CHLORIDE: 118 mmol/L — AB (ref 101–111)
CO2: 22 mmol/L (ref 22–32)
CREATININE: 2.85 mg/dL — AB (ref 0.61–1.24)
GFR calc Af Amer: 24 mL/min — ABNORMAL LOW (ref >60)
GFR calc non Af Amer: 21 mL/min — ABNORMAL LOW (ref >60)
GLUCOSE: 119 mg/dL — AB (ref 65–99)
Potassium: 3.4 mmol/L — ABNORMAL LOW (ref 3.5–5.1)
Sodium: 149 mmol/L — ABNORMAL HIGH (ref 135–145)

## 2017-02-01 LAB — GLUCOSE, CAPILLARY
GLUCOSE-CAPILLARY: 126 mg/dL — AB (ref 65–99)
GLUCOSE-CAPILLARY: 138 mg/dL — AB (ref 65–99)
Glucose-Capillary: 120 mg/dL — ABNORMAL HIGH (ref 65–99)
Glucose-Capillary: 129 mg/dL — ABNORMAL HIGH (ref 65–99)
Glucose-Capillary: 138 mg/dL — ABNORMAL HIGH (ref 65–99)
Glucose-Capillary: 151 mg/dL — ABNORMAL HIGH (ref 65–99)

## 2017-02-01 LAB — CBC
HCT: 24 % — ABNORMAL LOW (ref 39.0–52.0)
Hemoglobin: 7.2 g/dL — ABNORMAL LOW (ref 13.0–17.0)
MCH: 28.7 pg (ref 26.0–34.0)
MCHC: 30 g/dL (ref 30.0–36.0)
MCV: 95.6 fL (ref 78.0–100.0)
PLATELETS: 517 10*3/uL — AB (ref 150–400)
RBC: 2.51 MIL/uL — AB (ref 4.22–5.81)
RDW: 17 % — ABNORMAL HIGH (ref 11.5–15.5)
WBC: 12.5 10*3/uL — ABNORMAL HIGH (ref 4.0–10.5)

## 2017-02-01 LAB — MAGNESIUM: MAGNESIUM: 2.5 mg/dL — AB (ref 1.7–2.4)

## 2017-02-01 LAB — PHOSPHORUS: Phosphorus: 3.4 mg/dL (ref 2.5–4.6)

## 2017-02-01 MED ORDER — POTASSIUM CHLORIDE 20 MEQ/15ML (10%) PO SOLN
20.0000 meq | ORAL | Status: AC
  Administered 2017-02-01 (×2): 20 meq
  Filled 2017-02-01 (×2): qty 15

## 2017-02-01 MED ORDER — DIPHENHYDRAMINE-ZINC ACETATE 2-0.1 % EX CREA
TOPICAL_CREAM | Freq: Three times a day (TID) | CUTANEOUS | Status: DC
  Administered 2017-02-01: 1 via TOPICAL
  Administered 2017-02-01: 10:00:00 via TOPICAL
  Administered 2017-02-02 (×2): 1 via TOPICAL
  Administered 2017-02-02: 14:00:00 via TOPICAL
  Administered 2017-02-03 (×3): 1 via TOPICAL
  Administered 2017-02-04 – 2017-02-05 (×4): via TOPICAL
  Administered 2017-02-05: 1 via TOPICAL
  Administered 2017-02-05: 07:00:00 via TOPICAL
  Administered 2017-02-06 (×2): 1 via TOPICAL
  Administered 2017-02-06 – 2017-02-08 (×5): via TOPICAL
  Filled 2017-02-01: qty 28

## 2017-02-01 NOTE — Progress Notes (Signed)
Nutrition Follow-up  DOCUMENTATION CODES:   Obesity unspecified  INTERVENTION:   Continue:  Nepro with Carb Steady @ 15 ml/hr (360 ml/day) 60 ml Prostat QID Provides: 1448 kcal, 149 grams protein, and 261 ml H2O.   200 ml free water every 6 hours Total free water: 1061 ml  NUTRITION DIAGNOSIS:   Inadequate oral intake related to inability to eat as evidenced by NPO status. Ongoing.   GOAL:   Provide needs based on ASPEN/SCCM guidelines Met.   MONITOR:   TF tolerance, I & O's, Vent status, Labs  ASSESSMENT:   Pt with Brady-asystolic cardiac arrest following following anterior cervical fusion. History of carotid stenosis, left femoropopliteal bypass 2016, COPD, dementia, hypertension, hyperlipidemia, and stage IV chronic kidney disease.  2/10 intubated, now weaning on vent. 2/11 started on TF  Na 149, K+ 3.4, PO4 WNL Free water added  Diet Order:  Diet NPO time specified  Skin:   (MASD buttocks and scrotum)  Last BM:  2/16  Height:   Ht Readings from Last 1 Encounters:  01/25/17 5' 9.5" (1.765 m)    Weight:   Wt Readings from Last 1 Encounters:  02/01/17 230 lb 9.6 oz (104.6 kg)    Ideal Body Weight:  74 kg  BMI:  Body mass index is 33.57 kg/m.  Estimated Nutritional Needs:   Kcal:  6270-3500  Protein:  >/= 148 grams  Fluid:  > 1.5 L/day  EDUCATION NEEDS:   No education needs identified at this time  [place], [place], [place] Pager [phone] After Hours Pager

## 2017-02-01 NOTE — Progress Notes (Addendum)
Physical Therapy Treatment Patient Details Name: Joseph Hebert MRN: [id number] DOB: August 27, 1946 Today's Date: 02/01/2017    History of Present Illness 71 yo male s/p L 3-4 L4-5 ALIF L 3-s1 pedicule screw fixation with robotic assistance 2/9 hypotensive and hypoxic and transferred to ICU. Intubated 2/10 for respiratory decompensation. Developed Serratia PNA and being treated with ceftriaxone.     PT Comments    Goals re-assessed this session.  Pt with very little tolerance of EOB on vent today, he became tachy and desated on CPAP wean mode.  RN put back on full support vent after mobilizing very breifly EOB <5 mins up.  Pt with limited tolerance to mobility at this time, but hopeful after a weekned's rest, we can attempt again on Monday 02/04/17 with better response.  PT to continue to follow acutely.   Follow Up Recommendations  SNF     Equipment Recommendations  Wheelchair (measurements PT);Wheelchair cushion (measurements PT);Hospital bed;Other (comment) (hoyer lift)    Recommendations for Other Services   NA     Precautions / Restrictions Precautions Precautions: Fall;Back Precaution Comments: ALINE/ vent on Cpap support/ multiple lines/ leads, foley Required Braces or Orthoses: Spinal Brace Spinal Brace: Lumbar corset;Applied in sitting position (since we only did EOB and multiple lines no brace used today)    Mobility  Bed Mobility Overal bed mobility: Needs Assistance Bed Mobility: Supine to Sit;Rolling;Sit to Supine Rolling: Total assist;+2 for physical assistance Sidelying to sit: Total assist;+2 for physical assistance;+2 for safety/equipment Supine to sit: +2 for physical assistance;Total assist;+2 for safety/equipment Sit to supine: Total assist;+2 for physical assistance;+2 for safety/equipment   General bed mobility comments: pt without any assistances. pt reaching with L UE toward lines in response to startle with position changes, not much movement from right  arm/hand, puffy and edematous.   Transfers                 General transfer comment: not safe to attempt at this time         Balance Overall balance assessment: Needs assistance Sitting-balance support: Single extremity supported;Feet supported Sitting balance-Leahy Scale: Zero Sitting balance - Comments: Pt did seem to prop a bit with his left hand on bed, however, <10% and most of the work preformed by therapist in bed behind pt.                             Cognition Arousal/Alertness: Awake/alert Behavior During Therapy: Flat affect Overall Cognitive Status: Difficult to assess                         General Comments General comments (skin integrity, edema, etc.): HR70-137, O2 sats decreased to 81% on CPAP vent mode, RN placed pt back on full vent support and vent tube seemed to have pulled out a bit from all of the moving (RT informed). Pt with significant amout of secreations coming out of his mouth, into vent tubing, RN made aware and helped clear secreations from tubing.       Pertinent Vitals/Pain Pain Assessment: Faces Faces Pain Scale: Hurts even more Pain Location: grimace Pain Descriptors / Indicators: Grimacing Pain Intervention(s): Limited activity within patient's tolerance;Monitored during session;Repositioned           PT Goals (current goals can now be found in the care plan section) Acute Rehab PT Goals Patient Stated Goal: unable to state PT Goal Formulation: Patient unable to  participate in goal setting Time For Goal Achievement: 02/15/17 Potential to Achieve Goals: Fair Progress towards PT goals: Not progressing toward goals - comment (very limited session today, goals updated)    Frequency    Min 5X/week      PT Plan Current plan remains appropriate       End of Session Equipment Utilized During Treatment: Oxygen;Other (comment) (vent) Activity Tolerance: Treatment limited secondary to medical complications  (Comment) (very limited) Patient left: in bed;with call bell/phone within reach;with nursing/sitter in room     Time: 7944-4619 PT Time Calculation (min) (ACUTE ONLY): 28 min  Charges:     1 re-eval                    Zakari Bathe B. [place], [place], DPT [phone]   02/01/2017, 5:23 PM

## 2017-02-01 NOTE — Progress Notes (Signed)
Occupational Therapy Treatment Patient Details Name: Joseph Hebert MRN: [id number] DOB: [birth year] Today's Date: 02/01/2017    History of present illness 71 yo male s/p L 3-4 L4-5 ALIF L 3-s1 pedicule screw fixation with robotic assistance 2/9 hypotensive and hypoxic and transferred to ICU. Intubated 2/10 for respiratory decompensation. Developed Serratia PNA and being treated with ceftriaxone.    OT comments  Pt supine to sit only this session with elevated HR 137 at highest but not sustained. Pt with copious secretions and wet breathing sounds. Pt with linen change due to smear of stool and R side of bed wet from secretions. Pt return to supine and RN in room address decr saturations 78% vent CPAP mode. RT called at this time to help address.   Follow Up Recommendations  SNF    Equipment Recommendations  Other (comment);Wheelchair cushion (measurements OT);Wheelchair (measurements OT);Hospital bed (lift)    Recommendations for Other Services      Precautions / Restrictions Precautions Precautions: Fall;Back Precaution Comments: ALINE/ vent on Cpap support/ multiple lines/ leads, foley Required Braces or Orthoses: Spinal Brace Spinal Brace: Lumbar corset;Applied in sitting position       Mobility Bed Mobility Overal bed mobility: Needs Assistance Bed Mobility: Supine to Sit;Rolling;Sit to Supine Rolling: Total assist;+2 for physical assistance Sidelying to sit: Total assist;+2 for physical assistance;+2 for safety/equipment Supine to sit: +2 for physical assistance;Total assist;+2 for safety/equipment Sit to supine: Total assist;+2 for physical assistance;+2 for safety/equipment   General bed mobility comments: pt without any assistances. pt reaching with L UE toward lines in response to startle with position changes  Transfers                 General transfer comment: not safe to attempt at this time    Balance Overall balance assessment: Needs  assistance Sitting-balance support: Single extremity supported;Feet supported Sitting balance-Leahy Scale: Zero                             ADL Overall ADL's : Needs assistance/impaired Eating/Feeding: NPO   Grooming: Total assistance   Upper Body Bathing: Total assistance   Lower Body Bathing: Total assistance   Upper Body Dressing : Total assistance   Lower Body Dressing: Total assistance                 General ADL Comments: pt supine to sit this session with tachycardia and inability to sustain EOB. pt return to supine. pt noted to have copious secretions from R side of mouth. RN called in at the end of session due to secretions and decreased saturations      Vision                     Perception     Praxis      Cognition   Behavior During Therapy: Flat affect Overall Cognitive Status: Difficult to assess                       Extremity/Trunk Assessment               Exercises     Shoulder Instructions       General Comments      Pertinent Vitals/ Pain       Pain Assessment: Faces Faces Pain Scale: Hurts even more Pain Location: grimace Pain Descriptors / Indicators: Grimacing Pain Intervention(s): Monitored during session;Premedicated before session;Repositioned;Limited activity within patient's tolerance  Home Living                                          Prior Functioning/Environment              Frequency  Min 2X/week        Progress Toward Goals  OT Goals(current goals can now be found in the care plan section)  Progress towards OT goals: Not progressing toward goals - comment  Acute Rehab OT Goals Patient Stated Goal: none stated pt now intubated OT Goal Formulation: Patient unable to participate in goal setting Time For Goal Achievement: 02/06/17 Potential to Achieve Goals: Good ADL Goals Pt Will Perform Grooming: with min guard assist;sitting Pt Will Perform Upper  Body Dressing: with min guard assist;sitting Pt Will Perform Lower Body Dressing: sit to/from stand;with mod assist;with adaptive equipment Pt Will Transfer to Toilet: with mod assist;bedside commode;stand pivot transfer Additional ADL Goal #1: Pt will complete bed mobility supervision level as precursor to adls.  Additional ADL Goal #2: Pt will don doff brace mod I as precursor to adls.   Plan Discharge plan needs to be updated    Co-evaluation    PT/OT/SLP Co-Evaluation/Treatment: Yes Reason for Co-Treatment: Complexity of the patient's impairments (multi-system involvement);Necessary to address cognition/behavior during functional activity;For patient/therapist safety;To address functional/ADL transfers   OT goals addressed during session: ADL's and self-care;Proper use of Adaptive equipment and DME;Strengthening/ROM      End of Session Equipment Utilized During Treatment: Oxygen   Activity Tolerance Treatment limited secondary to medical complications (Comment)   Patient Left in bed;with call bell/phone within reach;with nursing/sitter in room   Nurse Communication Mobility status;Precautions;Need for lift equipment        Time: 1017-5102 OT Time Calculation (min): 32 min  Charges: OT General Charges $OT Visit: 1 Procedure OT Treatments $Self Care/Home Management : 8-22 mins  Parke Poisson B 02/01/2017, 1:54 PM    Jeri [place]   OTR/L Pager: [phone] Office: [phone] .

## 2017-02-01 NOTE — Progress Notes (Signed)
Patient pushed patient's ETT 3cm from 22 @lip  to 25 @ lip where it was previously noted. No complications. Vital signs stable. RN at bedside. X-ray pending. RT will continue to monitor.

## 2017-02-01 NOTE — Progress Notes (Signed)
PULMONARY / CRITICAL CARE MEDICINE   Name: Joseph Hebert MRN: [id number] DOB: 11-12-1946    ADMISSION DATE:  01/22/2017 CONSULTATION DATE:  01/25/2017  REFERRING MD:  Dr. Cyndy Freeze   CHIEF COMPLAINT:  Hypoxia   Brief: 71 yo male with PMH of DM, refractory hypertension, PVD post left fem pop, CAD, Dyspnea on excerption who had anterior abd approach l3-5 fusion on 2/6. 2/9 he was hypotensive and hypoxic and transferred to ICU. Intubated 2/10 for respiratory decompensation. Developed Serratia PNA and being treated with ceftriaxone.   SUBJECTIVE:  Off fentanyl gtt. Weaning on 5/8.   VITAL SIGNS: BP (!) 165/80   Pulse 78   Temp 98.9 F (37.2 C) (Oral)   Resp 17   Ht 5' 9.5" (1.765 m)   Wt 104.6 kg (230 lb 9.6 oz)   SpO2 92%   BMI 33.57 kg/m   HEMODYNAMICS: CVP:  [8 mmHg-33 mmHg] 11 mmHg  VENTILATOR SETTINGS: Vent Mode: PSV;CPAP FiO2 (%):  [40 %] 40 % Set Rate:  [24 bmp] 24 bmp Vt Set:  [580 mL] 580 mL PEEP:  [8 cmH20] 8 cmH20 Pressure Support:  [5 cmH20-8 cmH20] 5 cmH20 Plateau Pressure:  [20 QQP61-95 cmH20] 20 cmH20  INTAKE / OUTPUT: I/O last 3 completed shifts: In: 1142 [I.V.:162; NG/GT:980] Out: 2725 [Urine:2725]  PHYSICAL EXAMINATION: General:  Adult male, no distress, lying in bed  Neuro:  Lethargic, follows commands, moves extremities  HEENT:  ETT in place  Cardiovascular: RRR, no MRG, NI S1/S2  Lungs:  Diminished breath sounds, no wheezing no crackles, on vent  Abdomen: non-tender, non-distended, active bowel sounds  Musculoskeletal:  No acute  Skin:  Warm, dry, intact   LABS:  BMET  Recent Labs Lab 01/30/17 0550 01/31/17 0520 02/01/17 0603  NA 143 150* 149*  K 3.8 3.6 3.4*  CL 113* 118* 118*  CO2 20* 23 22  BUN 101* 104* 108*  CREATININE 3.23* 2.92* 2.85*  GLUCOSE 128* 125* 119*    Electrolytes  Recent Labs Lab 01/30/17 0550 01/31/17 0520 02/01/17 0603  CALCIUM 8.6* 8.9 9.1  MG 2.4 2.5* 2.5*  PHOS 4.1 3.7 3.4    CBC  Recent  Labs Lab 01/30/17 0550 01/31/17 0520 02/01/17 0603  WBC 11.1* 11.3* 12.5*  HGB 7.3* 7.3* 7.2*  HCT 23.4* 23.4* 24.0*  PLT 415* 438* 517*    Coag's No results for input(s): APTT, INR in the last 168 hours.  Sepsis Markers  Recent Labs Lab 01/26/17 1609  LATICACIDVEN 0.7    ABG  Recent Labs Lab 01/26/17 1656 01/27/17 0820 01/29/17 1928  PHART 7.337* 7.351 7.371  PCO2ART 34.4 33.8 35.1  PO2ART 59.0* 104 83.0    Liver Enzymes  Recent Labs Lab 01/26/17 2140 01/27/17 0500 01/28/17 0900  AST 71*  --   --   ALT 12*  --   --   ALKPHOS 61  --   --   BILITOT 0.8  --   --   ALBUMIN 2.0* 1.8* 1.8*    Cardiac Enzymes  Recent Labs Lab 01/26/17 1821 01/26/17 2140 01/27/17 0536  TROPONINI <0.03 0.03* 0.03*    Glucose  Recent Labs Lab 01/31/17 1201 01/31/17 1544 01/31/17 1937 01/31/17 2320 02/01/17 0313 02/01/17 0758  GLUCAP 120* 130* 129* 133* 138* 126*    Imaging Dg Chest Port 1 View  Result Date: 02/01/2017 CLINICAL DATA:  Intubation. EXAM: PORTABLE CHEST 1 VIEW COMPARISON:  01/31/2017.  01/30/2017 . FINDINGS: Endotracheal tube, NG tube, left subclavian line in stable position.  Cardiomegaly with mild bilateral from interstitial prominence and small pleural effusions consistent CHF. Interim improvement from prior exam. IMPRESSION: Lines and tubes in stable position. CHF with interim improvement from prior exam . Electronically Signed   By: [place]   On: 02/01/2017 07:23    STUDIES: 2/9 ct abd>>small rp bleed 2/10 CT chest >> BL effusions 2/10 CT abd/ pelvis >> stable 2/10 head CT >> neg 2/11 echo > EF 65-70%, G1DD, PAP 54. 2/12 LE Duplex >neg   CULTURES: 2/9 bc x 2 >Negative  2/9 sputum > Serratia marcescens  ANTIBIOTICS: vanc 2/9 >2/14 Zosyn 2/9 > 2/12 Ceftriaxone 2/12 >>  SIGNIFICANT EVENTS: 2/6 lumbar fusion ant abd approach. 2/9 transferred to ICU with hypotension and hypoxia 2/10 intubated , brady  arrest  LINES/TUBES: ETT 2/10 >> Lt [place] CVL 2/10 >>  ASSESSMENT / PLAN:  PULMONARY A: Acute Hypoxic Respiratory Failure Secondary to ?PE vs Pulmonary Edema -unable to get CT with Contrast, ECHO has increased RA and elevated PAP Small b/l pleural effusions with interstitial edema  Serratia PNA  P:   Vent Support  Wean as tolerated - currently on 40/8, tolerating wean this AM Trend CXR Continue Antibiotics as below   CARDIOVASCULAR A:  CAD G1DD P:  Cardiac Monitoring  Maintain MAP >65  Continue Lipitor, ASA   RENAL A:   Acute on Chronic Kidney Disease  Hypokalemia  P:   Trend BMP Replace electrolytes as needed Hold of on diuresis - BUN up from 2/15  Free water 200 q6h  GASTROINTESTINAL A:   No issues  P:   TF  PPI  Will need swallow evaluation once extubated   HEMATOLOGIC A:   No issues  P:  Trend CBC SCDS   INFECTIOUS A:   Serratia PNA P:   Trend fever and wbc curve Continue ceftriaxone (day 5)  ENDOCRINE A:   DM P:   SSI Q4H glucose checks   NEUROLOGIC A:   Acute Encephalopathy secondary to sedation  Post L3-L5 Fusion  P:   Management per Neurosurgery  Wean Fentanyl gtt to achieve RASS > Turn off and try to only do fentanyl pushes  Continue Neurontin  RASS goal: 0  FAMILY  - Updates: no family at bedside 2/16  - Inter-disciplinary family meet or Palliative Care meeting due by:  Ongoing   CC Time: 33 minutes   Hayden Pedro, AG-ACNP [place]  Pgr: [phone]  PCCM Pgr: [phone]    ATTENDING NOTE / ATTESTATION NOTE :   I have discussed the case with the resident/APP  Hayden Pedro NP.   I agree with the resident/APP's  history, physical examination, assessment, and plans.    I have edited the above note and modified it according to our agreed history, physical examination, assessment and plan.   Briefly, 71 yo male with refractory hypertension, PVD post left fem pop, CAD, Dyspnea on  excertion who had anterior abd approach L3-5 fusion. Early am 2/9 he was hypotensive and hypoxic and RRT was called to bedside. He was transferred to nsicu. He was intubated. He went into bradycardia-asytole and severe hypoxemia. Eventually diagnosed with HCAP and pulm edema.  Fio2 slowly being tapered down. [place].  Last 24 hrs, pt noted to be sleepy and very weak but doing fair on PST.  Fentanyl drip was dc'd on 2/15 but restarted last night.  Off sedation this am. Doing fair on PST but with low volumes.   Vitals:  Vitals:   02/01/17  0741 02/01/17 0800 02/01/17 0900 02/01/17 1000  BP: (!) 117/55 (!) 149/77 (!) 165/80 (!) 147/72  Pulse: 61 75 78 (!) 103  Resp: 16 12 17 16   Temp:  98.9 F (37.2 C)    TempSrc:  Oral    SpO2: 95% 95% 92% 94%  Weight:      Height:        Constitutional/General: well-nourished, well-developed, intubated, sedated, not in any distress  Body mass index is 33.57 kg/m. Wt Readings from Last 3 Encounters:  02/01/17 104.6 kg (230 lb 9.6 oz)  01/16/17 102.7 kg (226 lb 6 oz)  11/21/16 99.2 kg (218 lb 11.1 oz)    HEENT: PERLA, anicteric sclerae. (-) Oral thrush. Intubated, ETT in place  Neck: No masses. Midline trachea. No JVD, (-) LAD. (-) bruits appreciated.  Respiratory/Chest: Grossly normal chest. (-) deformity. (-) Accessory muscle use.  Symmetric expansion. Diminished BS on both lower lung zones. (-) wheezing,  Rhonchi Less crackles at bases.  (-) egophony  Cardiovascular: Regular rate and  rhythm, heart sounds normal, no murmur or gallops,  Gr 2  peripheral edema  Gastrointestinal:  Normal bowel sounds. Soft, non-tender. No hepatosplenomegaly.  (-) masses.   Musculoskeletal:  Normal muscle tone.   Extremities: Grossly normal. (-) clubbing, cyanosis.  Gr 2  Edema. Scrotal edema which is better.   Skin: (-) rash,lesions seen.   Neurological/Psychiatric : sedated, intubated. CN grossly intact. (-) lateralizing signs. Follows simple  commands. Answers simple questions.     CBC Recent Labs     01/30/17  0550  01/31/17  0520  02/01/17  0603  WBC  11.1*  11.3*  12.5*  HGB  7.3*  7.3*  7.2*  HCT  23.4*  23.4*  24.0*  PLT  415*  438*  517*    Coag's No results for input(s): APTT, INR in the last 72 hours.  BMET Recent Labs     01/30/17  0550  01/31/17  0520  02/01/17  0603  NA  143  150*  149*  K  3.8  3.6  3.4*  CL  113*  118*  118*  CO2  20*  23  22  BUN  101*  104*  108*  CREATININE  3.23*  2.92*  2.85*  GLUCOSE  128*  125*  119*    Electrolytes Recent Labs     01/30/17  0550  01/31/17  0520  02/01/17  0603  CALCIUM  8.6*  8.9  9.1  MG  2.4  2.5*  2.5*  PHOS  4.1  3.7  3.4    Sepsis Markers No results for input(s): PROCALCITON, O2SATVEN in the last 72 hours.  Invalid input(s): LACTICACIDVEN  ABG Recent Labs     01/29/17  1928  PHART  7.371  PCO2ART  35.1  PO2ART  83.0    Liver Enzymes No results for input(s): AST, ALT, ALKPHOS, BILITOT, ALBUMIN in the last 72 hours.  Cardiac Enzymes No results for input(s): TROPONINI, PROBNP in the last 72 hours.  Glucose Recent Labs     01/31/17  1201  01/31/17  1544  01/31/17  1937  01/31/17  2320  02/01/17  0313  02/01/17  0758  GLUCAP  120*  130*  129*  133*  138*  126*    Imaging Dg Chest Port 1 View  Result Date: 02/01/2017 CLINICAL DATA:  Intubation. EXAM: PORTABLE CHEST 1 VIEW COMPARISON:  01/31/2017.  01/30/2017 . FINDINGS: Endotracheal tube, NG tube, left subclavian line  in stable position. Cardiomegaly with mild bilateral from interstitial prominence and small pleural effusions consistent CHF. Interim improvement from prior exam. IMPRESSION: Lines and tubes in stable position. CHF with interim improvement from prior exam . Electronically Signed   By: [place]   On: 02/01/2017 07:23   Dg Chest Port 1 View  Result Date: 01/31/2017 CLINICAL DATA:  Hypoxia EXAM: PORTABLE CHEST 1 VIEW COMPARISON:  January 30, 2017  FINDINGS: Endotracheal tube tip is 5.6 cm above the carina. Central catheter tip is in the superior vena cava. Nasogastric tube tip and side port below the diaphragm. No pneumothorax. There are small pleural effusions bilaterally with bibasilar atelectasis. Heart is upper normal in size with pulmonary vascularity within normal limits. No adenopathy. There is an old healed fracture of the left clavicle. IMPRESSION: Tube and catheter positions as described without pneumothorax. Small pleural effusions bilaterally. Question residua of recent congestive heart failure. No edema currently present. There is bibasilar atelectatic change. Heart upper normal in size. Electronically Signed   By: Lowella Grip III M.D.   On: 01/31/2017 07:24   Assessment/Plan : Acute Hypoxemic Respiratory Failue 2/2 HCAP with Serratia marcescens on sputum ( RLL/RUL PNA) + Pulm edema. Slowly improving - main barrier to extubation is pt is very deconditioned and weak and is just starting to wake up - cont vent support. Daily PST. Potential extubation this weekend.  - d/c fentanyl drip. Just do fentanyl and versed pushes.  Precedex drip if with agitation.  - Cont Rocephin. Plan for 7 days for now.  The assumption is pt was resistant to zosyn which was given prior to rocephin.  - holding off on diuresis as pt has adequately diuresced and Na and Cl were elevated on 2/15.    AKI/CKD. Now with hyperNa and hyperCl but better.  - Nephrology following and signed off 2/14.  - I think he has diuresced well. Will hold off on diuresis as of 2/15 as BUN, Na, Cl are higher.  - cont free water. - he continues to make good urine off diuretics    S/P L3-L5 fusion -per neurosurgery.  -cont TF and free water.    Need for sedation - d/c fentanyl drip and do fentanyl pushes and versed pushes.  - if with agitation, precedex drip.     I spent 30 minutes of Critical Care time with this patient today. This is my time spent  independent of the APP or resident.    Family :Family updated at length on 2/15.  He was fxnal prior to admission and came in for elective surgery.  Hopefully, we can avoid a trache and PEG.    Monica Becton, MD 02/01/2017, 11:17 AM [facility] Pager [phone] After 3 pm or if no answer, call [phone]

## 2017-02-01 NOTE — Progress Notes (Signed)
Pt seen and examined. No issues overnight. Weaning on ventilator.  EXAM: Temp:  [98.8 F (37.1 C)-100 F (37.8 C)] 98.9 F (37.2 C) (02/16 0800) Pulse Rate:  [44-83] 78 (02/16 0900) Resp:  [9-25] 17 (02/16 0900) BP: (87-165)/(44-89) 165/80 (02/16 0900) SpO2:  [90 %-100 %] 92 % (02/16 0900) FiO2 (%):  [40 %] 40 % (02/16 0900) Weight:  [104.6 kg (230 lb 9.6 oz)] 104.6 kg (230 lb 9.6 oz) (02/16 0407) Intake/Output      02/15 0701 - 02/16 0700 02/16 0701 - 02/17 0700   I.V. (mL/kg) 72 (0.7)    NG/GT 800 30   Total Intake(mL/kg) 872 (8.3) 30 (0.3)   Urine (mL/kg/hr) 1875 (0.7)    Stool 0 (0)    Total Output 1875     Net -1003 +30        Stool Occurrence 3 x     Intubated, arousable Follows commands throughout Full strength  Stable/improving Continue current care per PCCM

## 2017-02-01 NOTE — Clinical Social Work Note (Signed)
Clinical Social Worker continuing to follow patient and family for support and discharge planning needs.  Patient currently on the ventilator but following commands and working with therapies.  Per MD note, patient hopeful to extubate successfully, however patient wife is agreeable to trach placement if needed.  CSW remains available for support and will follow along for medical readiness to initiate SNF search.  Barbette Or, [place]

## 2017-02-02 ENCOUNTER — Inpatient Hospital Stay (HOSPITAL_COMMUNITY): Payer: Medicare Other

## 2017-02-02 LAB — CBC
HEMATOCRIT: 23.3 % — AB (ref 39.0–52.0)
HEMOGLOBIN: 7.1 g/dL — AB (ref 13.0–17.0)
MCH: 29.2 pg (ref 26.0–34.0)
MCHC: 30.5 g/dL (ref 30.0–36.0)
MCV: 95.9 fL (ref 78.0–100.0)
Platelets: 566 10*3/uL — ABNORMAL HIGH (ref 150–400)
RBC: 2.43 MIL/uL — ABNORMAL LOW (ref 4.22–5.81)
RDW: 17.5 % — ABNORMAL HIGH (ref 11.5–15.5)
WBC: 12.6 10*3/uL — ABNORMAL HIGH (ref 4.0–10.5)

## 2017-02-02 LAB — MAGNESIUM: Magnesium: 2.7 mg/dL — ABNORMAL HIGH (ref 1.7–2.4)

## 2017-02-02 LAB — BASIC METABOLIC PANEL
ANION GAP: 8 (ref 5–15)
BUN: 105 mg/dL — ABNORMAL HIGH (ref 6–20)
CALCIUM: 9.4 mg/dL (ref 8.9–10.3)
CHLORIDE: 123 mmol/L — AB (ref 101–111)
CO2: 23 mmol/L (ref 22–32)
Creatinine, Ser: 2.45 mg/dL — ABNORMAL HIGH (ref 0.61–1.24)
GFR calc Af Amer: 29 mL/min — ABNORMAL LOW (ref >60)
GFR calc non Af Amer: 25 mL/min — ABNORMAL LOW (ref >60)
GLUCOSE: 129 mg/dL — AB (ref 65–99)
Potassium: 3.6 mmol/L (ref 3.5–5.1)
Sodium: 154 mmol/L — ABNORMAL HIGH (ref 135–145)

## 2017-02-02 LAB — GLUCOSE, CAPILLARY
GLUCOSE-CAPILLARY: 127 mg/dL — AB (ref 65–99)
GLUCOSE-CAPILLARY: 126 mg/dL — AB (ref 65–99)
GLUCOSE-CAPILLARY: 106 mg/dL — AB (ref 65–99)
GLUCOSE-CAPILLARY: 115 mg/dL — AB (ref 65–99)
GLUCOSE-CAPILLARY: 165 mg/dL — AB (ref 65–99)

## 2017-02-02 LAB — PHOSPHORUS: Phosphorus: 2.6 mg/dL (ref 2.5–4.6)

## 2017-02-02 MED ORDER — ASPIRIN 81 MG PO CHEW
81.0000 mg | CHEWABLE_TABLET | Freq: Every day | ORAL | Status: DC
  Administered 2017-02-03 – 2017-02-08 (×6): 81 mg via ORAL
  Filled 2017-02-02 (×6): qty 1

## 2017-02-02 MED ORDER — SODIUM CHLORIDE 0.45 % IV SOLN
INTRAVENOUS | Status: DC
  Administered 2017-02-02 – 2017-02-03 (×4): via INTRAVENOUS

## 2017-02-02 MED ORDER — GABAPENTIN 250 MG/5ML PO SOLN
300.0000 mg | Freq: Three times a day (TID) | ORAL | Status: DC
  Administered 2017-02-04 – 2017-02-05 (×6): 300 mg via ORAL
  Filled 2017-02-02 (×12): qty 6

## 2017-02-02 MED ORDER — ATORVASTATIN CALCIUM 80 MG PO TABS
80.0000 mg | ORAL_TABLET | Freq: Every day | ORAL | Status: DC
  Administered 2017-02-03 – 2017-02-07 (×5): 80 mg via ORAL
  Filled 2017-02-02 (×6): qty 1

## 2017-02-02 MED ORDER — FREE WATER
250.0000 mL | Freq: Four times a day (QID) | Status: DC
  Administered 2017-02-02: 250 mL

## 2017-02-02 MED ORDER — DOCUSATE SODIUM 50 MG/5ML PO LIQD: 100.0000 mg | Freq: Two times a day (BID) | ORAL | Status: DC

## 2017-02-02 NOTE — Evaluation (Signed)
Clinical/Bedside Swallow Evaluation Patient Details  Name: Joseph Hebert MRN: [id number] Date of Birth: 10-Aug-1946  Today's Date: 02/02/2017 Time: SLP Start Time (ACUTE ONLY): 7858 SLP Stop Time (ACUTE ONLY): 1725 SLP Time Calculation (min) (ACUTE ONLY): 20 min  Past Medical History:  Past Medical History:  Diagnosis Date  . Anemia   . Arrhythmia    takes metoprolol daily  . Arthritis   . Asthma   . CAD (coronary artery disease)   . Carotid artery occlusion    with Claudication  . Chronic back pain    stenosis  . Chronic kidney disease   . COPD (chronic obstructive pulmonary disease) (HCC)    Spirva daily and Albuterol as needed  . Dementia   . Depression   . Diabetes mellitus without complication (HCC)    Type 2 diet controlled.Never been on meds  . Gout    takes Uloric daily  . Head injury    as a child  . History of colon polyps    benign  . History of gastric ulcer   . History of hiatal hernia   . Hyperlipidemia    takes Atorvastatin and Fenofibrate daily  . Hypertension    takes Lisinopril,Amlodipine,and Hydralazine daily  . Impaired memory    takes Namenda daily  . Numbness    lower left leg and upper right thigh  . Pneumonia    hx of-couple of yrs ago  . Shortness of breath dyspnea    with exertion  . Tuberculosis 2006    9 months   . Vertigo    Past Surgical History:  Past Surgical History:  Procedure Laterality Date  . ABDOMINAL EXPOSURE N/A 01/22/2017   Procedure: ABDOMINAL EXPOSURE;  Surgeon: [name], MD;  Location: [facility];  Service: Vascular;  Laterality: N/A;  . ANTERIOR LAT LUMBAR FUSION N/A 01/22/2017   Procedure: LUMBAR THREE-FOUR, LUMBAR FOUR-FIVE ANTERIOR LATERAL INTERBODY FUSION;  Surgeon: [name], MD;  Location: [facility];  Service: Neurosurgery;  Laterality: N/A;  L3-4 L4-5 Lateral interbody fusion  . ANTERIOR LUMBAR FUSION N/A 01/22/2017   Procedure: LUMBAR FIVE-SACRUM ONE ANTERIOR LUMBAR INTERBODY FUSION WITH  ABDOMINAL APPROACH BY DR [name];  Surgeon: [name], MD;  Location: [facility];  Service: Neurosurgery;  Laterality: N/A;  . CAROTID BODY TUMOR EXCISION Left 09/21/2015   Procedure: EXCISE LEFT NECK NODULE WITH LOCAL ;  Surgeon: [name], MD;  Location: [facility];  Service: Vascular;  Laterality: Left;  . CAROTID ENDARTERECTOMY  Nov. 15,2011   LEFT cea  . cataract surgery Bilateral   . COLONOSCOPY    . EYE SURGERY    . FEMORAL-POPLITEAL BYPASS GRAFT Left 08/23/2015   Procedure: LEFT FEMORAL-POPLITEAL ARTERY BYPASS WITH GORETEX GRAFT;  Surgeon: [name], MD;  Location: [facility];  Service: Vascular;  Laterality: Left;  . JOINT REPLACEMENT  1980   RIGHT  knee  . LUMBAR EPIDURAL INJECTION    . LUMBAR LAMINECTOMY/DECOMPRESSION MICRODISCECTOMY Right 02/17/2016   Procedure: Laminectomy and Foraminotomy - Lumbar four-five right;  Surgeon: [name], MD;  Location: [facility];  Service: Neurosurgery;  Laterality: Right;  right  . LUMBAR PERCUTANEOUS PEDICLE SCREW 3 LEVEL N/A 01/22/2017   Procedure: LUMBAR THREE-SACRAL ONE PERCUTANEOUS PEDICLE SCREW FIXATION WITH ROBOTIC ASSISTANCE;  Surgeon: [name], MD;  Location: [facility];  Service: Neurosurgery;  Laterality: N/A;  L3 to S1 Percutaneous pedicle screw fixation  . PERIPHERAL VASCULAR CATHETERIZATION N/A 06/24/2015   Procedure: Abdominal Aortogram;  Surgeon: [name], MD;  Location: [facility];  Service: Cardiovascular;  Laterality: N/A;  . Stents  Aug.  23, 1999   Bilateral iliofemoral  stents, [place].  . TONSILLECTOMY     HPI:  71 yo male with PMH notable for hiatal hernia, gastric ulcer, COPD, dementia, depression, memory impairment, DM, refractory hypertension, PVD post left fem pop, CAD, Dyspnea on exertion who had anterior abd approach l3-5 fusion on 2/6. 2/9 he was hypotensive and hypoxic and transferred to ICU. Intubated 2/10 for respiratory decompensation. Developed Serratia PNA and being treated  with ceftriaxone. Extubated 02/02/17 ~12:30pm. Seen for swallowing evaluation. Most recent CXR showed persistent patchy left basilar opacity, which may reflect atelectasis and/or infiltrate. Interval improvement in pulmonary vascular congestion as compared 02/01/2017.   Assessment / Plan / Recommendation Clinical Impression  Patient presents with oropharyngeal swallow which appears grossly within functional limits at bedside with what appears to be adequate airway protection. No overt signs of aspiration noted, and vocal quality clear upon phonation. Patient's voice is hoarse but strong, and he elicits a strong volitional cough. Swallow appears timely. Patient remains at risk for aspiration given history of dementia, prolonged intubation and recent pneumonia. Given these risk factors, recommend initiating dysphagia 3 diet with thin liquids, with full supervision to monitor for slow rate of intake, respiratory status. SLP will follow for tolerance and determine readiness for upgrade.     Aspiration Risk  Moderate aspiration risk    Diet Recommendation Dysphagia 3 (Mech soft);Thin liquid   Liquid Administration via: Cup Medication Administration: Whole meds with puree Supervision: Patient able to self feed;Full supervision/cueing for compensatory strategies Compensations: Slow rate;Small sips/bites;Minimize environmental distractions Postural Changes: Seated upright at 90 degrees    Other  Recommendations Oral Care Recommendations: Oral care BID;Oral care before and after PO   Follow up Recommendations Other (comment) (TBD)      Frequency and Duration min 2x/week  2 weeks       Prognosis Prognosis for Safe Diet Advancement: Good Barriers to Reach Goals: Cognitive deficits      Swallow Study   General HPI: 71 yo male with PMH notable for hiatal hernia, gastric ulcer, COPD, dementia, depression, memory impairment, DM, refractory hypertension, PVD post left fem pop, CAD, Dyspnea on  exertion who had anterior abd approach l3-5 fusion on 2/6. 2/9 he was hypotensive and hypoxic and transferred to ICU. Intubated 2/10 for respiratory decompensation. Developed Serratia PNA and being treated with ceftriaxone. Extubated 02/02/17 ~12:30pm. Seen for swallowing evaluation. Most recent CXR showed persistent patchy left basilar opacity, which may reflect atelectasis and/or infiltrate. Interval improvement in pulmonary vascular congestion as compared 02/01/2017. Type of Study: Bedside Swallow Evaluation Previous Swallow Assessment: none per chart Diet Prior to this Study: NPO Temperature Spikes Noted: No Respiratory Status: Nasal cannula History of Recent Intubation: Yes Length of Intubations (days): 7 days Date extubated: 02/02/17 Behavior/Cognition: Cooperative;Alert;Confused;Pleasant mood Oral Cavity Assessment: Within Functional Limits Oral Care Completed by SLP: Yes Oral Cavity - Dentition: Adequate natural dentition Vision: Functional for self-feeding Self-Feeding Abilities: Able to feed self;Needs set up Patient Positioning: Upright in bed Baseline Vocal Quality: Hoarse Volitional Cough: Strong Volitional Swallow: Able to elicit    Oral/Motor/Sensory Function Overall Oral Motor/Sensory Function: Within functional limits   Ice Chips Ice chips: Within functional limits Presentation: Spoon   Thin Liquid Thin Liquid: Within functional limits Presentation: Cup;Spoon;Self Fed;Straw    Nectar Thick Nectar Thick Liquid: Not tested   Honey Thick Honey Thick Liquid:  Not tested   Puree Puree: Within functional limits Presentation: [place], [place] CF-SLP Speech-Language Pathologist [phone] Solid: Within functional limits Presentation: Self Fed        Aliene Altes 02/02/2017,5:26 PM

## 2017-02-02 NOTE — Progress Notes (Signed)
Patient ID: Joseph Hebert, male   DOB: 01/05/1946, 71 y.o.   MRN: [id number] Vital signs are stable Hopeful for extubation today as patient has been agitated when nonsedated and would like to have tube removed. Motor function remained stable

## 2017-02-02 NOTE — Progress Notes (Signed)
[facility] Progress Note Patient Name: Joseph Hebert DOB: May 01, 1946 MRN: [id number]   Date of Service  02/02/2017  HPI/Events of Note  Request to change per tube medications to PO.   eICU Interventions  Per tube medications changed to PO.      Intervention Category Minor Interventions: Routine modifications to care plan (e.g. PRN medications for pain, fever)  Joseph Hebert 02/02/2017, 8:54 PM

## 2017-02-02 NOTE — Progress Notes (Signed)
eLink Physician-Brief Progress Note Patient Name: Joseph Hebert DOB: 04-27-1946 MRN: [id number]   Date of Service  02/02/2017  HPI/Events of Note  Pt trying to pull ETT  Pt seen. Very much awake and comfortable. Moves BUE and tries to take ET tube out.   We explained that we have to wait til am. Likely we can extubate in the  Morning.   eICU Interventions  Restraints for now. Versed and fentanyl pushes prn.      Intervention Category Evaluation Type: Other  [place] 02/02/2017, 2:31 AM

## 2017-02-02 NOTE — Progress Notes (Signed)
eLink Physician-Brief Progress Note Patient Name: Joseph Hebert DOB: December 29, 1945 MRN: [id number]   Date of Service  02/02/2017  HPI/Events of Note  Extubated today. NPO pending swallow evaluation. Request for IV fluids. Na+ = 154. LVEF = 65-70%.  eICU Interventions  Will order: 1. 0.45 NaCl to run IV at 125 mL/hour.     Intervention Category Intermediate Interventions: Other:  Lysle Dingwall 02/02/2017, 3:47 PM

## 2017-02-02 NOTE — Progress Notes (Signed)
PULMONARY / CRITICAL CARE MEDICINE   Name: Joseph Hebert MRN: [id number] DOB: 12-14-1946    ADMISSION DATE:  01/22/2017 CONSULTATION DATE:  01/25/2017  REFERRING MD:  Dr. Cyndy Freeze   CHIEF COMPLAINT:  Hypoxia   Brief: 71 yo male with PMH of DM, refractory hypertension, PVD post left fem pop, CAD, Dyspnea on exertion who had anterior abd approach l3-5 fusion on 2/6. 2/9 he was hypotensive and hypoxic and transferred to ICU. Intubated 2/10 for respiratory decompensation. Developed Serratia PNA and being treated with ceftriaxone.   SUBJECTIVE:  Increased alertness, trying to pull tube overnight , weaned well yest Fent changed to pushes yesterday .  Remains in neg bal. Scr tr down , CXR w/ decreased edema   Na tr up .  VITAL SIGNS: BP (!) 126/39   Pulse (!) 51   Temp 98.2 F (36.8 C) (Axillary)   Resp (!) 24   Ht 5' 9.5" (1.765 m)   Wt 231 lb 7.7 oz (105 kg)   SpO2 100%   BMI 33.69 kg/m   HEMODYNAMICS: CVP:  [9 mmHg-15 mmHg] 13 mmHg  VENTILATOR SETTINGS: Vent Mode: PRVC FiO2 (%):  [40 %] 40 % Set Rate:  [24 bmp] 24 bmp Vt Set:  [580 mL] 580 mL PEEP:  [5 cmH20-8 cmH20] 5 cmH20 Pressure Support:  [5 cmH20] 5 cmH20 Plateau Pressure:  [17 cmH20-23 cmH20] 17 cmH20  INTAKE / OUTPUT: I/O last 3 completed shifts: In: 1656 [I.V.:71; NG/GT:1335; IV Piggyback:250] Out: 3075 [Urine:3075]  PHYSICAL EXAMINATION: General:  Adult male, no distress, lying in bed on vent  Neuro: sleepy this am (fent at 0530)  HEENT:  ETT in place  Cardiovascular: RRR, no MRG, NI S1/S2  Lungs:  Diminished breath sounds, no wheezing no crackles, on vent  Abdomen: non-tender, non-distended, active bowel sounds . TF  Musculoskeletal:  No acute  Skin:  Warm, dry, intact   LABS:  BMET  Recent Labs Lab 01/31/17 0520 02/01/17 0603 02/02/17 0530  NA 150* 149* 154*  K 3.6 3.4* 3.6  CL 118* 118* 123*  CO2 23 22 23   BUN 104* 108* 105*  CREATININE 2.92* 2.85* 2.45*  GLUCOSE 125* 119* 129*     Electrolytes  Recent Labs Lab 01/31/17 0520 02/01/17 0603 02/02/17 0530  CALCIUM 8.9 9.1 9.4  MG 2.5* 2.5* 2.7*  PHOS 3.7 3.4 2.6    CBC  Recent Labs Lab 01/30/17 0550 01/31/17 0520 02/01/17 0603  WBC 11.1* 11.3* 12.5*  HGB 7.3* 7.3* 7.2*  HCT 23.4* 23.4* 24.0*  PLT 415* 438* 517*    Coag's No results for input(s): APTT, INR in the last 168 hours.  Sepsis Markers  Recent Labs Lab 01/26/17 1609  LATICACIDVEN 0.7    ABG  Recent Labs Lab 01/26/17 1656 01/27/17 0820 01/29/17 1928  PHART 7.337* 7.351 7.371  PCO2ART 34.4 33.8 35.1  PO2ART 59.0* 104 83.0    Liver Enzymes  Recent Labs Lab 01/26/17 2140 01/27/17 0500 01/28/17 0900  AST 71*  --   --   ALT 12*  --   --   ALKPHOS 61  --   --   BILITOT 0.8  --   --   ALBUMIN 2.0* 1.8* 1.8*    Cardiac Enzymes  Recent Labs Lab 01/26/17 1821 01/26/17 2140 01/27/17 0536  TROPONINI <0.03 0.03* 0.03*    Glucose  Recent Labs Lab 02/01/17 0758 02/01/17 1141 02/01/17 1544 02/01/17 1943 02/01/17 2339 02/02/17 0326  GLUCAP 126* 151* 129* 138* 120* 127*  Imaging Dg Chest Port 1 View  Result Date: 02/02/2017 CLINICAL DATA:  Evaluation for ventilator management. EXAM: PORTABLE CHEST 1 VIEW COMPARISON:  Prior radiograph from 02/01/2017. FINDINGS: Patient is intubated with the tip of the endotracheal tube positioned approximately 6 cm above the carina. Enteric tube courses in the the abdomen. Left subclavian central venous catheter in stable position. Mild cardiomegaly, stable. Mediastinal silhouette within normal limits. Lungs hypoinflated. Interval improvement in pulmonary vascular congestion as compared to previous. Persistent mild patchy left basilar opacity, which may reflect atelectasis and/ or infiltrate. No other new focal airspace disease. No pleural effusion. No pneumothorax. Osseous structures unchanged. Remotely healed left clavicular fracture noted. IMPRESSION: 1. Tip of the endotracheal  tube 6 cm above the carina. Stable and status satisfactory position of remaining support apparatus. 2. Persistent patchy left basilar opacity, which may reflect atelectasis and/or infiltrate. 3. Interval improvement in pulmonary vascular congestion as compared 02/01/2017. Electronically Signed   By: Jeannine Boga M.D.   On: 02/02/2017 04:34    STUDIES: 2/9 ct abd>>small rp bleed 2/10 CT chest >> BL effusions 2/10 CT abd/ pelvis >> stable 2/10 head CT >> neg 2/11 echo > EF 65-70%, G1DD, PAP 54. 2/12 LE Duplex >neg   CULTURES: 2/9 bc x 2 >Negative  2/9 sputum > Serratia marcescens  ANTIBIOTICS: vanc 2/9 >2/14 Zosyn 2/9 > 2/12 Ceftriaxone 2/12 >>  SIGNIFICANT EVENTS: 2/6 lumbar fusion ant abd approach. 2/9 transferred to ICU with hypotension and hypoxia 2/10 intubated , brady arrest  LINES/TUBES: ETT 2/10 >> Lt [place] CVL 2/10 >>  ASSESSMENT / PLAN:  PULMONARY A: Acute Hypoxic Respiratory Failure Secondary to ?PE vs Pulmonary Edema -unable to get CT with Contrast, ECHO has increased RA and elevated PAP Small b/l pleural effusions with interstitial edema  Serratia PNA  2/17 >CXR improved w/ decreased edema/neg bal. Weaned well yest.  P:   Vent Support  Wean as tolerated  Trend CXR Continue Antibiotics as below  Eval for extubation today , try to wean once more alert   CARDIOVASCULAR A:  CAD G1DD P:  Cardiac Monitoring  Maintain MAP >65  Continue Lipitor, ASA   RENAL A:   Acute on Chronic Kidney Disease  Hypokalemia  P:   Trend BMP Replace electrolytes as needed Hold of on diuresis -Na/ BUN tr up  Increase Free water 250 q6h  GASTROINTESTINAL A:   No issues  P:   TF  PPI  Will need swallow evaluation once extubated   HEMATOLOGIC A:   Anemia  P:  Trend CBC SCDS  Hep DVT prophylx Transfuse per protocol Hbg <7  INFECTIOUS A:   Serratia PNA P:   Trend fever and wbc curve Continue ceftriaxone (day 5)  ENDOCRINE A:   DM P:    SSI Q4H glucose checks   NEUROLOGIC A:   Acute Encephalopathy secondary to sedation  Post L3-L5 Fusion  P:   Management per Neurosurgery  Decrease sedation as able , fent pushes only .  Continue Neurontin  RASS goal: 0  FAMILY  - Updates: no family at bedside 2/16  - Inter-disciplinary family meet or Palliative Care meeting due by:  Ongoing   Trusten Hume NP-C  [facility]  [phone]   02/02/2017

## 2017-02-02 NOTE — Procedures (Signed)
Extubation Procedure Note  Patient Details:   Name: Joseph Hebert DOB: [birth year] MRN: [id number]   Airway Documentation:     Evaluation  O2 sats: stable throughout Complications: No apparent complications Patient did tolerate procedure well. Bilateral Breath Sounds: Clear, Diminished   Yes  Small decrease in sp02, increased fio2 with [place] to 5l/min sp02 now 94-95% Incentive spirometer instructed750-1.0L  [place] Standard 02/02/2017, 12:41 PM

## 2017-02-02 NOTE — Progress Notes (Signed)
eLink Physician-Brief Progress Note Patient Name: Joseph Hebert DOB: 1946/12/04 MRN: [id number]   Date of Service  02/02/2017  HPI/Events of Note  Soft stools.  Patient on tube feeds.  Does not look like it C. difficile.   eICU Interventions  Observe for now.  Flexiseal     Intervention Category Major Interventions: Other:  Rush Landmark 02/02/2017, 12:39 AM

## 2017-02-03 ENCOUNTER — Inpatient Hospital Stay (HOSPITAL_COMMUNITY): Payer: Medicare Other

## 2017-02-03 DIAGNOSIS — J156 Pneumonia due to other aerobic Gram-negative bacteria: Secondary | ICD-10-CM

## 2017-02-03 LAB — BASIC METABOLIC PANEL
Anion gap: 9 (ref 5–15)
BUN: 71 mg/dL — AB (ref 6–20)
CALCIUM: 9.2 mg/dL (ref 8.9–10.3)
CO2: 24 mmol/L (ref 22–32)
CREATININE: 1.98 mg/dL — AB (ref 0.61–1.24)
Chloride: 119 mmol/L — ABNORMAL HIGH (ref 101–111)
GFR calc Af Amer: 37 mL/min — ABNORMAL LOW (ref >60)
GFR, EST NON AFRICAN AMERICAN: 32 mL/min — AB (ref >60)
GLUCOSE: 133 mg/dL — AB (ref 65–99)
Potassium: 3.6 mmol/L (ref 3.5–5.1)
Sodium: 152 mmol/L — ABNORMAL HIGH (ref 135–145)

## 2017-02-03 LAB — CBC
HEMATOCRIT: 25.6 % — AB (ref 39.0–52.0)
Hemoglobin: 7.9 g/dL — ABNORMAL LOW (ref 13.0–17.0)
MCH: 29.4 pg (ref 26.0–34.0)
MCHC: 30.9 g/dL (ref 30.0–36.0)
MCV: 95.2 fL (ref 78.0–100.0)
PLATELETS: 672 10*3/uL — AB (ref 150–400)
RBC: 2.69 MIL/uL — ABNORMAL LOW (ref 4.22–5.81)
RDW: 17 % — AB (ref 11.5–15.5)
WBC: 13.1 10*3/uL — ABNORMAL HIGH (ref 4.0–10.5)

## 2017-02-03 LAB — GLUCOSE, CAPILLARY
GLUCOSE-CAPILLARY: 114 mg/dL — AB (ref 65–99)
GLUCOSE-CAPILLARY: 115 mg/dL — AB (ref 65–99)
GLUCOSE-CAPILLARY: 269 mg/dL — AB (ref 65–99)
Glucose-Capillary: 116 mg/dL — ABNORMAL HIGH (ref 65–99)
Glucose-Capillary: 129 mg/dL — ABNORMAL HIGH (ref 65–99)
Glucose-Capillary: 131 mg/dL — ABNORMAL HIGH (ref 65–99)
Glucose-Capillary: 140 mg/dL — ABNORMAL HIGH (ref 65–99)
Glucose-Capillary: 155 mg/dL — ABNORMAL HIGH (ref 65–99)

## 2017-02-03 LAB — MAGNESIUM: Magnesium: 2.4 mg/dL (ref 1.7–2.4)

## 2017-02-03 LAB — PHOSPHORUS: Phosphorus: 2.3 mg/dL — ABNORMAL LOW (ref 2.5–4.6)

## 2017-02-03 MED ORDER — DOCUSATE SODIUM 100 MG PO CAPS
100.0000 mg | ORAL_CAPSULE | Freq: Two times a day (BID) | ORAL | Status: DC
  Administered 2017-02-04 – 2017-02-08 (×7): 100 mg via ORAL
  Filled 2017-02-03 (×7): qty 1

## 2017-02-03 MED ORDER — METOPROLOL SUCCINATE ER 50 MG PO TB24
50.0000 mg | ORAL_TABLET | Freq: Every day | ORAL | Status: DC
  Administered 2017-02-03 – 2017-02-04 (×2): 50 mg via ORAL
  Filled 2017-02-03 (×2): qty 1

## 2017-02-03 MED ORDER — LABETALOL HCL 5 MG/ML IV SOLN
10.0000 mg | Freq: Once | INTRAVENOUS | Status: AC
  Administered 2017-02-03: 10 mg via INTRAVENOUS
  Filled 2017-02-03: qty 4

## 2017-02-03 MED ORDER — PANTOPRAZOLE SODIUM 40 MG PO TBEC
40.0000 mg | DELAYED_RELEASE_TABLET | Freq: Every day | ORAL | Status: DC
  Administered 2017-02-03 – 2017-02-07 (×5): 40 mg via ORAL
  Filled 2017-02-03 (×5): qty 1

## 2017-02-03 MED ORDER — INSULIN ASPART 100 UNIT/ML ~~LOC~~ SOLN
2.0000 [IU] | Freq: Three times a day (TID) | SUBCUTANEOUS | Status: DC
  Administered 2017-02-03 – 2017-02-05 (×5): 2 [IU] via SUBCUTANEOUS

## 2017-02-03 NOTE — Progress Notes (Signed)
PULMONARY / CRITICAL CARE MEDICINE   Name: Joseph Hebert MRN: [id number] DOB: 1946-02-27    ADMISSION DATE:  01/22/2017 CONSULTATION DATE:  01/25/2017  REFERRING MD:  Dr. Cyndy Freeze   CHIEF COMPLAINT:  Hypoxia   Brief: 71 yo male with PMH of DM, refractory hypertension, PVD post left fem pop, CAD, Dyspnea on exertion who had anterior abd approach l3-5 fusion on 2/6. 2/9 he was hypotensive and hypoxic and transferred to ICU. Intubated 2/10 for respiratory decompensation. Developed Serratia PNA and being treated with ceftriaxone.   SUBJECTIVE:  Extubated yesterday , doing well  Speech/swallow eval 2/17 with mod asp risk , D3 diet /thin liquid  Vomited this am , zofran helped.  B/p tr up    VITAL SIGNS: BP (!) 178/80   Pulse 75   Temp 97.4 F (36.3 C) (Oral)   Resp 19   Ht 5' 9.5" (1.765 m)   Wt 230 lb 13.2 oz (104.7 kg)   SpO2 95%   BMI 33.60 kg/m   HEMODYNAMICS: CVP:  [6 mmHg-10 mmHg] 10 mmHg  VENTILATOR SETTINGS: Vent Mode: PSV;CPAP FiO2 (%):  [40 %] 40 % PEEP:  [5 cmH20] 5 cmH20 Pressure Support:  [5 cmH20] 5 cmH20  INTAKE / OUTPUT: I/O last 3 completed shifts: In: 2616.8 [P.O.:120; I.V.:1901.8; NG/GT:595] Out: 7026 [Urine:4100; Stool:325]  PHYSICAL EXAMINATION: General:  Adult male, no distress,  Neuro: a/ox 3 , f/c  HEENT: dry mucosa  Cardiovascular: RRR, no MRG, NI S1/S2  Lungs:  Diminished breath sounds, no wheezing no crackles, on vent  Abdomen: non-tender, non-distended, active bowel sounds . TF  Mid line incision w/ mild redness  Musculoskeletal:  No acute  Skin:  Warm, dry, intact   LABS:  BMET  Recent Labs Lab 02/01/17 0603 02/02/17 0530 02/03/17 0547  NA 149* 154* 152*  K 3.4* 3.6 3.6  CL 118* 123* 119*  CO2 22 23 24   BUN 108* 105* 71*  CREATININE 2.85* 2.45* 1.98*  GLUCOSE 119* 129* 133*    Electrolytes  Recent Labs Lab 02/01/17 0603 02/02/17 0530 02/03/17 0547  CALCIUM 9.1 9.4 9.2  MG 2.5* 2.7* 2.4  PHOS 3.4 2.6 2.3*     CBC  Recent Labs Lab 02/01/17 0603 02/02/17 0530 02/03/17 0547  WBC 12.5* 12.6* 13.1*  HGB 7.2* 7.1* 7.9*  HCT 24.0* 23.3* 25.6*  PLT 517* 566* 672*    Coag's No results for input(s): APTT, INR in the last 168 hours.  Sepsis Markers No results for input(s): LATICACIDVEN, PROCALCITON, O2SATVEN in the last 168 hours.  ABG  Recent Labs Lab 01/29/17 1928  PHART 7.371  PCO2ART 35.1  PO2ART 83.0    Liver Enzymes  Recent Labs Lab 01/28/17 0900  ALBUMIN 1.8*    Cardiac Enzymes No results for input(s): TROPONINI, PROBNP in the last 168 hours.  Glucose  Recent Labs Lab 02/02/17 1532 02/02/17 1958 02/03/17 0008 02/03/17 0150 02/03/17 0350 02/03/17 0737  GLUCAP 115* 165* 269* 129* 116* 115*    Imaging Dg Chest Port 1 View  Result Date: 02/03/2017 CLINICAL DATA:  Pneumonia EXAM: PORTABLE CHEST 1 VIEW COMPARISON:  Yesterday FINDINGS: Lower volumes after extubation. Increased hazy density at the bases. Left subclavian central line is in stable position. Chronic cardiopericardial enlargement. IMPRESSION: Lower volumes with increased atelectasis after extubation. History of pneumonia, which may be obscured. Electronically Signed   By: Monte Fantasia M.D.   On: 02/03/2017 06:48    STUDIES: 2/9 ct abd>>small rp bleed 2/10 CT chest >> BL effusions  2/10 CT abd/ pelvis >> stable 2/10 head CT >> neg 2/11 echo > EF 65-70%, G1DD, PAP 54. 2/12 LE Duplex >neg   CULTURES: 2/9 bc x 2 >Negative  2/9 sputum > Serratia marcescens  ANTIBIOTICS: vanc 2/9 >2/14 Zosyn 2/9 > 2/12 Ceftriaxone 2/12 >>  SIGNIFICANT EVENTS: 2/6 lumbar fusion ant abd approach. 2/9 transferred to ICU with hypotension and hypoxia 2/10 intubated , brady arrest  LINES/TUBES: ETT 2/10 >> Lt [place] CVL 2/10 >>  ASSESSMENT / PLAN:  PULMONARY A: Acute Hypoxic Respiratory Failure Secondary to ?PE vs Pulmonary Edema >extubated 2/17  -unable to get CT with Contrast, ECHO has increased  RA and elevated PAP Small b/l pleural effusions with interstitial edema  Serratia PNA   .  P:   O2 to keep sat >90%. Continue Antibiotics as below   CARDIOVASCULAR A:  CAD G1DD HTN -b/p tr up (home norvasc,lisinopril, toprol)  P:  Cardiac Monitoring  Maintain MAP >65  Continue Lipitor, ASA  Restart Toprol  RENAL A:   Acute on Chronic Kidney Disease  Hypokalemia -resolved  Hypernatremia  P:   Trend BMP Replace electrolytes as needed Cont on IVF 1/2 NS -decrease rate 75cc/hr -once eating more change to [facility].   GASTROINTESTINAL A:   Mod Aspiration -swallow eval 2/17 , D 3 diet /thin liquids N/V x 1 2/18  P:    PPI  D3 diet /thin liquids  Check ABD 1 view xr    HEMATOLOGIC A:   Anemia  P:  Trend CBC SCDS  Hep DVT prophylx Transfuse per protocol Hbg <7  INFECTIOUS A:   Serratia PNA P:   Trend fever and wbc curve Continue ceftriaxone (day 6)  ENDOCRINE A:   DM P:   SSI Three times a day    NEUROLOGIC A:   Acute Encephalopathy secondary to sedation >?resolved  Post L3-L5 Fusion  P:   Management per Neurosurgery  Continue Neurontin  Limit sedating rx as able   FAMILY  - Updates: no family at bedside 2/16 - Inter-disciplinary family meet or Palliative Care meeting due by:  Ongoing   Tammy Parrett NP-C  [facility]  [phone]   02/03/2017

## 2017-02-03 NOTE — Progress Notes (Signed)
Patient ID: Joseph Hebert, male   DOB: [birth year], 71 y.o.   MRN: [id number] Patient tolerated extubation yesterday His electrolytes still shows significant hypernatremia His strength is decreased but he is slowly starting to mobilize with the help of physical therapy Incision on his abdomen is reddened I've advised be painted with Betadine. Continue to monitor his clinical status and mobilize as tolerated.

## 2017-02-04 DIAGNOSIS — E870 Hyperosmolality and hypernatremia: Secondary | ICD-10-CM

## 2017-02-04 DIAGNOSIS — I10 Essential (primary) hypertension: Secondary | ICD-10-CM

## 2017-02-04 DIAGNOSIS — E119 Type 2 diabetes mellitus without complications: Secondary | ICD-10-CM

## 2017-02-04 LAB — CBC
HEMATOCRIT: 25.2 % — AB (ref 39.0–52.0)
HEMOGLOBIN: 7.6 g/dL — AB (ref 13.0–17.0)
MCH: 28.7 pg (ref 26.0–34.0)
MCHC: 30.2 g/dL (ref 30.0–36.0)
MCV: 95.1 fL (ref 78.0–100.0)
Platelets: 709 10*3/uL — ABNORMAL HIGH (ref 150–400)
RBC: 2.65 MIL/uL — AB (ref 4.22–5.81)
RDW: 16.9 % — ABNORMAL HIGH (ref 11.5–15.5)
WBC: 10.8 10*3/uL — ABNORMAL HIGH (ref 4.0–10.5)

## 2017-02-04 LAB — GLUCOSE, CAPILLARY
GLUCOSE-CAPILLARY: 132 mg/dL — AB (ref 65–99)
GLUCOSE-CAPILLARY: 143 mg/dL — AB (ref 65–99)
GLUCOSE-CAPILLARY: 140 mg/dL — AB (ref 65–99)
GLUCOSE-CAPILLARY: 154 mg/dL — AB (ref 65–99)
Glucose-Capillary: 136 mg/dL — ABNORMAL HIGH (ref 65–99)
Glucose-Capillary: 104 mg/dL — ABNORMAL HIGH (ref 65–99)

## 2017-02-04 LAB — BASIC METABOLIC PANEL
Anion gap: 9 (ref 5–15)
BUN: 49 mg/dL — AB (ref 6–20)
CHLORIDE: 120 mmol/L — AB (ref 101–111)
CO2: 25 mmol/L (ref 22–32)
CREATININE: 1.75 mg/dL — AB (ref 0.61–1.24)
Calcium: 8.7 mg/dL — ABNORMAL LOW (ref 8.9–10.3)
GFR calc Af Amer: 43 mL/min — ABNORMAL LOW (ref >60)
GFR calc non Af Amer: 37 mL/min — ABNORMAL LOW (ref >60)
GLUCOSE: 154 mg/dL — AB (ref 65–99)
POTASSIUM: 3.6 mmol/L (ref 3.5–5.1)
SODIUM: 154 mmol/L — AB (ref 135–145)

## 2017-02-04 LAB — MAGNESIUM: Magnesium: 2.3 mg/dL (ref 1.7–2.4)

## 2017-02-04 LAB — PHOSPHORUS: Phosphorus: 3.2 mg/dL (ref 2.5–4.6)

## 2017-02-04 MED ORDER — NYSTATIN 100000 UNIT/GM EX POWD
Freq: Three times a day (TID) | CUTANEOUS | Status: DC
  Administered 2017-02-04: via TOPICAL
  Administered 2017-02-05: 1 g via TOPICAL
  Administered 2017-02-05 – 2017-02-08 (×9): via TOPICAL
  Filled 2017-02-04: qty 15

## 2017-02-04 MED ORDER — ENSURE ENLIVE PO LIQD
237.0000 mL | Freq: Two times a day (BID) | ORAL | Status: DC
  Administered 2017-02-04 – 2017-02-07 (×6): 237 mL via ORAL

## 2017-02-04 MED ORDER — DEXTROSE 5 % IV SOLN
INTRAVENOUS | Status: DC
  Administered 2017-02-04 – 2017-02-05 (×2): via INTRAVENOUS

## 2017-02-04 MED ORDER — HYDRALAZINE HCL 25 MG PO TABS
25.0000 mg | ORAL_TABLET | Freq: Two times a day (BID) | ORAL | Status: DC
  Administered 2017-02-04 – 2017-02-08 (×8): 25 mg via ORAL
  Filled 2017-02-04 (×8): qty 1

## 2017-02-04 MED ORDER — AMLODIPINE BESYLATE 5 MG PO TABS
5.0000 mg | ORAL_TABLET | Freq: Every day | ORAL | Status: DC
  Administered 2017-02-04 – 2017-02-08 (×5): 5 mg via ORAL
  Filled 2017-02-04 (×5): qty 1

## 2017-02-04 MED ORDER — ACETAMINOPHEN 325 MG PO TABS
650.0000 mg | ORAL_TABLET | Freq: Four times a day (QID) | ORAL | Status: DC
  Administered 2017-02-04 – 2017-02-08 (×14): 650 mg via ORAL
  Filled 2017-02-04 (×14): qty 2

## 2017-02-04 NOTE — Progress Notes (Signed)
  Speech Language Pathology Treatment: Dysphagia  Patient Details Name: Joseph Hebert MRN: [id number] DOB: 1946/04/09 Today's Date: 02/04/2017 Time: 9371-6967 SLP Time Calculation (min) (ACUTE ONLY): 13 min  Assessment / Plan / Recommendation Clinical Impression  [name] voice is mildly rough, but he believes it is at his baseline. He also reports distaste for most of the food on his trays. No overt s/s of aspiration or dysphagia are noted with regular solids and thin liquids. Recommend advancement to regular textures and thin liquids to maximize food options. SLP will f/u briefly for tolerance of liberalized diet.    HPI HPI: 71 yo male with PMH notable for hiatal hernia, gastric ulcer, COPD, dementia, depression, memory impairment, DM, refractory hypertension, PVD post left fem pop, CAD, Dyspnea on exertion who had anterior abd approach l3-5 fusion on 2/6. 2/9 he was hypotensive and hypoxic and transferred to ICU. Intubated 2/10 for respiratory decompensation. Developed Serratia PNA and being treated with ceftriaxone. Extubated 02/02/17 ~12:30pm. Seen for swallowing evaluation. Most recent CXR showed persistent patchy left basilar opacity, which may reflect atelectasis and/or infiltrate. Interval improvement in pulmonary vascular congestion as compared 02/01/2017.      SLP Plan  Continue with current plan of care       Recommendations  Diet recommendations: Regular;Thin liquid Liquids provided via: Cup;Straw Medication Administration: Whole meds with puree Supervision: Patient able to self feed;Intermittent supervision to cue for compensatory strategies Compensations: Slow rate;Small sips/bites;Minimize environmental distractions Postural Changes and/or Swallow Maneuvers: Seated upright 90 degrees;Upright 30-60 min after meal                Oral Care Recommendations: Oral care BID Follow up Recommendations:  (tba - none anticipated) Plan: Continue with current plan of care       GO                Germain Osgood 02/04/2017, 10:43 AM  Germain Osgood, M.A. CCC-SLP [phone]

## 2017-02-04 NOTE — NC FL2 (Signed)
[place] LEVEL OF CARE SCREENING TOOL     IDENTIFICATION  Patient Name: Joseph Hebert Birthdate: September 10, 1946 Sex: male Admission Date (Current Location): 01/22/2017  [facility] and [place] Number:  Herbalist and Address:  [facility], [place] [address], [place], [place] [address]      Provider Number: [id number]  Attending Physician Name and Address:  [name], MD  Relative Name and Phone Number:  Spouse [phone]    Current Level of Care: Hospital Recommended Level of Care: [place] Prior Approval Number:    Date Approved/Denied:   PASRR Number: [id number]  Discharge Plan: SNF    Current Diagnoses: Patient Active Problem List   Diagnosis Date Noted  . Acute pulmonary edema (HCC)   . Acute hypoxemic respiratory failure ([place])   . HCAP (healthcare-associated pneumonia)   . Acute respiratory failure with hypoxia ([place])   . Cardiac asystole ([place])   . Cardiogenic shock ([place])   . Acute encephalopathy   . Lumbosacral spondylosis with radiculopathy 02/17/2016  . Atheroscler native arteries the extremities w/intermit claudication ([place]) 08/23/2015  . Chronic kidney disease (CKD), stage III (moderate) 07/18/2015  . Encounter for preprocedural cardiovascular examination 07/17/2015  . Bilateral leg pain 06/16/2015  . History of surgical procedure 12/08/2014  . Exudative retinal detachment 12/08/2014  . Choroidal nevus 12/08/2014  . Aftercare following surgery of the circulatory system, [place] 01/01/2014  . Occlusion and stenosis of carotid artery without mention of cerebral infarction 01/01/2013    Orientation RESPIRATION BLADDER Height & Weight     Self, Situation, Place  Normal Continent, External catheter Weight: 233 lb 7.5 oz (105.9 kg) Height:  5' 9.5" (176.5 cm)  BEHAVIORAL SYMPTOMS/MOOD NEUROLOGICAL BOWEL NUTRITION STATUS      Continent    AMBULATORY STATUS COMMUNICATION OF NEEDS Skin    Extensive Assist Verbally Surgical wounds (Left Flank / Abdomen)                       Personal Care Assistance Level of Assistance  Bathing, Feeding, Dressing Bathing Assistance: Limited assistance Feeding assistance: Limited assistance Dressing Assistance: Limited assistance     Functional Limitations Info  Sight, Hearing, Speech Sight Info: Adequate Hearing Info: Adequate Speech Info: Adequate    SPECIAL CARE FACTORS FREQUENCY  PT (By licensed PT), OT (By licensed OT)     PT Frequency: 5x wk OT Frequency: 5x wk            Contractures Contractures Info: Not present    Additional Factors Info  Code Status, Allergies Code Status Info: Full Allergies Info: No Known Allergies           Current Medications (02/04/2017):  This is the current hospital active medication list Current Facility-Administered Medications  Medication Dose Route Frequency Provider Last Rate Last Dose  . 0.45 % sodium chloride infusion   Intravenous Continuous Melvenia Needles, NP   Stopped at 02/04/17 0900  . 0.9 %  sodium chloride infusion   Intravenous Continuous Jamal Maes, MD 10 mL/hr at 01/26/17 2000    . 0.9 %  sodium chloride infusion  250 mL Intravenous Continuous Vincent J Costella, PA-C      . acetaminophen (TYLENOL) tablet 1,000 mg  1,000 mg Oral Q6H Vincent J Costella, PA-C   1,000 mg at 02/04/17 1228  . aspirin chewable tablet 81 mg  81 mg Oral Daily Anders Simmonds, MD   [phone]  mg at 02/04/17 0923  . atorvastatin (LIPITOR) tablet 80 mg  80 mg Oral QHS Anders Simmonds, MD   80 mg at 02/03/17 2217  . cefTRIAXone (ROCEPHIN) 2 g in dextrose 5 % 50 mL IVPB  2 g Intravenous Q24H Rahul P Desai, PA-C   2 g at 02/04/17 1453  . Chlorhexidine Gluconate Cloth 2 % PADS 6 each  6 each Topical Q0600 Rush Farmer, MD   6 each at 02/03/17 0630  . diphenhydrAMINE-zinc acetate (BENADRYL) 2-0.1 % cream   Topical Q8H [name], MD      . docusate sodium (COLACE) capsule 100 mg  100 mg  Oral BID Tammy S Parrett, NP      . feeding supplement (ENSURE ENLIVE) (ENSURE ENLIVE) liquid 237 mL  237 mL Oral BID BM [name], MD   237 mL at 02/04/17 1454  . fentaNYL (SUBLIMAZE) injection 50 mcg  50 mcg Intravenous Q15 min PRN Omar Person, NP      . fentaNYL (SUBLIMAZE) injection 50 mcg  50 mcg Intravenous Q2H PRN Omar Person, NP   50 mcg at 02/03/17 0146  . gabapentin (NEURONTIN) 250 MG/5ML solution 300 mg  300 mg Oral Q8H Anders Simmonds, MD   300 mg at 02/04/17 0631  . heparin injection 5,000 Units  5,000 Units Subcutaneous Q8H Rigoberto Noel, MD   5,000 Units at 02/04/17 1453  . insulin aspart (novoLOG) injection 2-6 Units  2-6 Units Subcutaneous TID WC Melvenia Needles, NP   2 Units at 02/04/17 1229  . metoprolol succinate (TOPROL-XL) 24 hr tablet 50 mg  50 mg Oral Daily Tammy S Parrett, NP   50 mg at 02/04/17 0923  . ondansetron (ZOFRAN) injection 4 mg  4 mg Intravenous Q4H PRN [place] Mink Costella, PA-C   4 mg at 02/03/17 0945  . pantoprazole (PROTONIX) EC tablet 40 mg  40 mg Oral Q1200 Tammy S Parrett, NP   40 mg at 02/04/17 1228  . sodium chloride flush (NS) 0.9 % injection 3 mL  3 mL Intravenous Q12H Vincent J Costella, PA-C   3 mL at 02/04/17 0925  . sodium chloride flush (NS) 0.9 % injection 3 mL  3 mL Intravenous PRN Traci Sermon, PA-C         Discharge Medications: Please see discharge summary for a list of discharge medications.  Relevant Imaging Results:  Relevant Lab Results:   Additional Information [place], [place]

## 2017-02-04 NOTE — Clinical Social Work Note (Signed)
Clinical Social Worker continuing to follow patient and family for support and discharge planning needs.  Patient with marked improvement - CSW has initiated new SNF search and will follow up with patient and family to provide bed offers once available.  CSW remains available for support and to facilitate patient discharge needs once medically stable.  Barbette Or, [place]

## 2017-02-04 NOTE — Progress Notes (Addendum)
Pt seen and examined.  No issues overnight. Has been working with PT Currently without any concerns.  EXAM: Temp:  [97.5 F (36.4 C)-98.6 F (37 C)] 97.9 F (36.6 C) (02/19 1129) Pulse Rate:  [48-79] 64 (02/19 1300) Resp:  [11-27] 20 (02/19 1300) BP: (129-179)/(53-150) 165/72 (02/19 1300) SpO2:  [89 %-98 %] 90 % (02/19 1300) Weight:  [105.9 kg (233 lb 7.5 oz)] 105.9 kg (233 lb 7.5 oz) (02/19 0412) Intake/Output      02/18 0701 - 02/19 0700 02/19 0701 - 02/20 0700   P.O. 990 240   I.V. (mL/kg) 1977.5 (18.7) 150 (1.4)   NG/GT     IV Piggyback 100    Total Intake(mL/kg) 3067.5 (29) 390 (3.7)   Urine (mL/kg/hr) 2875 (1.1) 1250 (1.7)   Stool     Total Output 2875 1250   Net +192.5 -860        Urine Occurrence 1 x     Awake and alert Communicative.  Strength decreased but working with PT to help with strength and mobilization  Stable. Will transfer to [place] hospitalist for chronic conditions  I've seen and evaluated him.  He is doing well.  We will transfer him to the floor and ask the hospitalists to help [place] with his medical issues.

## 2017-02-04 NOTE — Consult Note (Signed)
History and Physical    Joseph Hebert:[id number] DOB: 1946-01-23 DOA: 01/22/2017  PCP: [name], MD   Consult requested by: Neurosurgery (Attending: Dr. [name] Ditty) Reason for consultation: Assistance with management of chronic medical conditions  Date of consultation: 02/04/2017  HPI: Joseph Hebert is a 71 y.o. gentleman with multiple medical issues including PVD (history of LLE bypass, bilateral common iliac stents), CAD, COPD, CKD 3, HTN, HLD, DM, CAD, and chronic back pain who was admitted to the neurosurgery service for ACDF of his lumbar spine.  The patient went to the OR on 2/6.  On 2/9, he was transferred to the ICU for evaluation of hypotension, hypoxia, and acute encephalopathy.  Chest xray was concerning for a LLL pneumonia (concern for aspiration but treated as HCAP).  He was started on vanc and zosyn.  On 2/10 he had a bradycardic arrest; per notes, CPR was performed for less than one minute before ROSC.  He was intubated.  There was no evidence of MI.  Echo on the chart, and cardiology followed immediately after the arrest.  The patient has also had evidence of AKI and he has required transfusion for post operative anemia.  Of note, he also had positive stool heme occult testing but apparently no overt signs of GI bleeding.  Hgb has been 7-7.5 for several days now.  He was extubated successfully on 2/17.  He is tolerating PO intake.  He is ready for transfer out of the ICU. Of note, it appears that cardiology, nephrology, and PCCM have signed off for now.  The patient received vanc from 2/9 - 2/14 The patient received zosyn from 2/9 - 2/12 He has been on Rocephin since 2/12  Sputum culture from 2/9 positive for Serratia Blood cultures from 2/9 NGTD  Presently, he is awake, alert, and oriented.  Only complaining of pain "in his butt"; rates it a 1 out of 10.  No chest pain or shortness of breath.  No headache.  He has had nausea one time since being able to eat  again.  Review of Systems: As per HPI otherwise 10 point review of systems negative.    Past Medical History:  Diagnosis Date  . Anemia   . Arrhythmia    takes metoprolol daily  . Arthritis   . Asthma   . CAD (coronary artery disease)   . Carotid artery occlusion    with Claudication  . Chronic back pain    stenosis  . Chronic kidney disease   . COPD (chronic obstructive pulmonary disease) (HCC)    Spirva daily and Albuterol as needed  . Dementia   . Depression   . Diabetes mellitus without complication (HCC)    Type 2 diet controlled.Never been on meds  . Gout    takes Uloric daily  . Head injury    as a child  . History of colon polyps    benign  . History of gastric ulcer   . History of hiatal hernia   . Hyperlipidemia    takes Atorvastatin and Fenofibrate daily  . Hypertension    takes Lisinopril,Amlodipine,and Hydralazine daily  . Impaired memory    takes Namenda daily  . Numbness    lower left leg and upper right thigh  . Pneumonia    hx of-couple of yrs ago  . Shortness of breath dyspnea    with exertion  . Tuberculosis 2006    9 months   . Vertigo     Past Surgical  History:  Procedure Laterality Date  . ABDOMINAL EXPOSURE N/A 01/22/2017   Procedure: ABDOMINAL EXPOSURE;  Surgeon: [name], MD;  Location: [place];  Service: Vascular;  Laterality: N/A;  . ANTERIOR LAT LUMBAR FUSION N/A 01/22/2017   Procedure: LUMBAR THREE-FOUR, LUMBAR FOUR-FIVE ANTERIOR LATERAL INTERBODY FUSION;  Surgeon: [name], MD;  Location: [place];  Service: Neurosurgery;  Laterality: N/A;  L3-4 L4-5 Lateral interbody fusion  . ANTERIOR LUMBAR FUSION N/A 01/22/2017   Procedure: LUMBAR FIVE-SACRUM ONE ANTERIOR LUMBAR INTERBODY FUSION WITH ABDOMINAL APPROACH BY DR [name];  Surgeon: [name], MD;  Location: [place];  Service: Neurosurgery;  Laterality: N/A;  . CAROTID BODY TUMOR EXCISION Left 09/21/2015   Procedure: EXCISE LEFT NECK NODULE WITH LOCAL ;   Surgeon: [name], MD;  Location: [place];  Service: Vascular;  Laterality: Left;  . CAROTID ENDARTERECTOMY  Nov. 15,2011   LEFT cea  . cataract surgery Bilateral   . COLONOSCOPY    . EYE SURGERY    . FEMORAL-POPLITEAL BYPASS GRAFT Left 08/23/2015   Procedure: LEFT FEMORAL-POPLITEAL ARTERY BYPASS WITH GORETEX GRAFT;  Surgeon: [name], MD;  Location: [place];  Service: Vascular;  Laterality: Left;  . JOINT REPLACEMENT  1980   RIGHT  knee  . LUMBAR EPIDURAL INJECTION    . LUMBAR LAMINECTOMY/DECOMPRESSION MICRODISCECTOMY Right 02/17/2016   Procedure: Laminectomy and Foraminotomy - Lumbar four-five right;  Surgeon: [name], MD;  Location: [place] [facility];  Service: Neurosurgery;  Laterality: Right;  right  . LUMBAR PERCUTANEOUS PEDICLE SCREW 3 LEVEL N/A 01/22/2017   Procedure: LUMBAR THREE-SACRAL ONE PERCUTANEOUS PEDICLE SCREW FIXATION WITH ROBOTIC ASSISTANCE;  Surgeon: [name], MD;  Location: [place];  Service: Neurosurgery;  Laterality: N/A;  L3 to S1 Percutaneous pedicle screw fixation  . PERIPHERAL VASCULAR CATHETERIZATION N/A 06/24/2015   Procedure: Abdominal Aortogram;  Surgeon: [name], MD;  Location: [place] [facility];  Service: Cardiovascular;  Laterality: N/A;  . Stents  Aug.  23, 1999   Bilateral iliofemoral  stents, [place].  . TONSILLECTOMY       reports that he quit smoking about 7 months ago. His smoking use included Cigarettes. He smoked 0.30 packs per day. He has never used smokeless tobacco. He reports that he does not drink alcohol or use drugs.  He is married.  He has two adult children.  Allergies  Allergen Reactions  . No Known Allergies     Family History  Problem Relation Age of Onset  . Diabetes Mother   . Hyperlipidemia Father   . Hypertension Father   . Other Father     Right Leg Amputation  . Heart disease Sister     Aneyrism      Prior to Admission medications   Medication Sig Start Date End Date Taking?  Authorizing Provider  amLODipine (NORVASC) 10 MG tablet Take 10 mg by mouth daily.   Yes Historical Provider, MD  aspirin EC 81 MG tablet Take 81 mg by mouth daily.   Yes Historical Provider, MD  atorvastatin (LIPITOR) 80 MG tablet Take 80 mg by mouth at bedtime.    Yes Historical Provider, MD  fenofibrate (TRICOR) 145 MG tablet Take 145 mg by mouth at bedtime.    Yes Historical Provider, MD  furosemide (LASIX) 40 MG tablet Take 40 mg by mouth daily.   Yes Historical Provider, MD  hydrALAZINE (APRESOLINE) 25 MG tablet Take 25 mg by mouth 2 (two) times daily. 05/17/15  Yes Historical Provider, MD  lisinopril (PRINIVIL,ZESTRIL) 40 MG tablet Take 40 mg by mouth daily.   Yes Historical Provider, MD  memantine (NAMENDA XR) 28 MG CP24 24 hr capsule Take 28 mg by mouth daily.   Yes Historical Provider, MD  metoprolol succinate (TOPROL-XL) 50 MG 24 hr tablet Take 50 mg by mouth daily. Take with or immediately following a meal.   Yes Historical Provider, MD  ULORIC 80 MG TABS Take 80 mg by mouth daily. 11/12/16  Yes Historical Provider, MD    Physical Exam: Vitals:   02/04/17 1900 02/04/17 2000 02/04/17 2100 02/04/17 2200  BP: (!) 160/72 (!) 160/71 (!) 138/53 (!) 173/71  Pulse: 63 (!) 58 (!) 45 (!) 54  Resp: 20 18 15 16   Temp:  98.6 F (37 C)    TempSrc:  Oral    SpO2: 93% 91% (!) 89% 97%  Weight:      Height:          Constitutional: NAD, calm, comfortable Vitals:   02/04/17 1900 02/04/17 2000 02/04/17 2100 02/04/17 2200  BP: (!) 160/72 (!) 160/71 (!) 138/53 (!) 173/71  Pulse: 63 (!) 58 (!) 45 (!) 54  Resp: 20 18 15 16   Temp:  98.6 F (37 C)    TempSrc:  Oral    SpO2: 93% 91% (!) 89% 97%  Weight:      Height:       Eyes: PERRL, lids and conjunctivae normal ENMT: Mucous membranes are moist. Posterior pharynx not completely visualized. Normal dentition.  Neck: normal appearance, supple, no masses Respiratory: clear to auscultation listening anteriorly.  No wheezing.  Normal  respiratory effort. No accessory muscle use.  Cardiovascular: Bradycardic but regular.  No murmurs / rubs / gallops. Trace edema.  2+ pedal pulses. GI: abdomen is soft and compressible.  No distention.  No tenderness.  Bowel sounds are present.  Abdominal incision site noted (defer to surgeon). Musculoskeletal:  No joint deformity in upper and lower extremities. No contractures.  Normal muscle tone.  Skin: no rashes, pale, cool, dry Neurologic: CN 2-12 grossly intact. Sensation intact.  Strength symmetric; generalized weakness. Psychiatric: Normal judgment and insight. Alert and oriented x 3. Normal mood.     Labs on Admission: I have personally reviewed following labs and imaging studies  CBC:  Recent Labs Lab 01/31/17 0520 02/01/17 0603 02/02/17 0530 02/03/17 0547 02/04/17 0410  WBC 11.3* 12.5* 12.6* 13.1* 10.8*  HGB 7.3* 7.2* 7.1* 7.9* 7.6*  HCT 23.4* 24.0* 23.3* 25.6* 25.2*  MCV 94.7 95.6 95.9 95.2 95.1  PLT 438* 517* 566* 672* 202*   Basic Metabolic Panel:  Recent Labs Lab 01/31/17 0520 02/01/17 0603 02/02/17 0530 02/03/17 0547 02/04/17 0410  NA 150* 149* 154* 152* 154*  K 3.6 3.4* 3.6 3.6 3.6  CL 118* 118* 123* 119* 120*  CO2 23 22 23 24 25   GLUCOSE 125* 119* 129* 133* 154*  BUN 104* 108* 105* 71* 49*  CREATININE 2.92* 2.85* 2.45* 1.98* 1.75*  CALCIUM 8.9 9.1 9.4 9.2 8.7*  MG 2.5* 2.5* 2.7* 2.4 2.3  PHOS 3.7 3.4 2.6 2.3* 3.2   GFR: Estimated Creatinine Clearance: 46.8 mL/min (by C-G formula based on SCr of 1.75 mg/dL (H)).  CBG:  Recent Labs Lab 02/04/17 0323 02/04/17 0757 02/04/17 1127 02/04/17 1727 02/04/17 1932  GLUCAP 136* 132* 143* 140* 154*   Radiological Exams on Admission: Dg Chest Port 1 View  Result Date: 02/03/2017 CLINICAL DATA:  Pneumonia EXAM: PORTABLE CHEST 1 VIEW COMPARISON:  Yesterday FINDINGS: Lower volumes after extubation. Increased hazy density at the bases. Left subclavian central line is in stable position. Chronic  cardiopericardial enlargement. IMPRESSION: Lower volumes with increased atelectasis after extubation. History of pneumonia, which may be obscured. Electronically Signed   By: Monte Fantasia M.D.   On: 02/03/2017 06:48   Dg Abd Portable 1v  Result Date: 02/03/2017 CLINICAL DATA:  Vomiting, was extubated yesterday EXAM: PORTABLE ABDOMEN - 1 VIEW COMPARISON:  Portable exam 1250 hours compared to 01/30/2017 FINDINGS: Interval removal of nasogastric tube. Nonobstructive bowel gas pattern. No bowel dilatation or bowel wall thickening. Prior lumbosacral fusion L3-S1. BILATERAL common iliac artery stents. No urinary tract calcification. IMPRESSION: Normal bowel gas pattern. Electronically Signed   By: Lavonia Dana M.D.   On: 02/03/2017 12:49    Assessment/Plan Active Problems:   Lumbosacral spondylosis with radiculopathy   Acute respiratory failure with hypoxia (HCC)   Cardiac asystole (HCC)   Cardiogenic shock (HCC)   Acute encephalopathy   Acute hypoxemic respiratory failure ([place])   HCAP (healthcare-associated pneumonia)   Acute pulmonary edema (HCC)      S/P ACDF lumbar spine --Defer to neurosurgery --PT following --Analgesics as needed  S/P Bradycardic arrest --No MI --Avoiding AV nodal blocking agents at this point  HCAP --Remains on Rocephin; can likely stop now (should be effectively covered at this point) --Extubated successfully 2/17  Hypernatremia --Change IV fluids to D5  History of DM --Carb controlled diet --SSI coverage AC --May need long acting insulin as well  Anemia with positive stool heme occult --Already on daily PPI --Monitor for transfusion requirement (Hgb less than 7) --Monitor for signs of active GI bleeding --Consider outpatient GI follow-up  HTN --Resume hydralazine, amlodipine at half dose and monitor requirements   DVT prophylaxis: SubQ heparin Code Status: FULL  Family Communication:  Disposition Plan: To be determined.  Expect he is headed  to rehab/SNF.    TIME SPENT: 60 minutes   Eber Jones MD [facility] Pager [phone]  If 7PM-7AM, please contact night-coverage [email or web address] Password [facility]  02/04/2017, 10:53 PM

## 2017-02-04 NOTE — Progress Notes (Signed)
Nutrition Follow-up  DOCUMENTATION CODES:   Obesity unspecified  INTERVENTION:   Ensure Enlive po BID, each supplement provides 350 kcal and 20 grams of protein  NUTRITION DIAGNOSIS:   Inadequate oral intake related to  (decreased appetite) as evidenced by meal completion < 50%. Ongoing.   GOAL:   Patient will meet greater than or equal to 90% of their needs Progressing.   MONITOR:   PO intake, Supplement acceptance, Skin, Labs  ASSESSMENT:   Pt with Brady-asystolic cardiac arrest following following anterior cervical fusion. History of carotid stenosis, left femoropopliteal bypass 2016, COPD, dementia, hypertension, hyperlipidemia, and stage IV chronic kidney disease.  2/17 extubated and started on DYS 3 diet with Thin liquids, advanced to regular today Meal Completion: 25% Na 154 CBG's: 155-136-132  Pt does not remember what he had for Breakfast this am. Reports appetite as so/so. He is willing to try ensure.   Diet Order:  Diet regular Room service appropriate? Yes; Fluid consistency: Thin  Skin:   (MASD buttocks and scrotum)  Last BM:  100 ml via rectal tube 2/18  Height:   Ht Readings from Last 1 Encounters:  01/25/17 5' 9.5" (1.765 m)    Weight:   Wt Readings from Last 1 Encounters:  02/04/17 233 lb 7.5 oz (105.9 kg)    Ideal Body Weight:  74 kg  BMI:  Body mass index is 33.98 kg/m.  Estimated Nutritional Needs:   Kcal:  2000-2200  Protein:  110-125 grams  Fluid:  2 L/day  EDUCATION NEEDS:   No education needs identified at this time  [place], [place], [place] Pager [phone] After Hours Pager

## 2017-02-04 NOTE — Progress Notes (Signed)
Physical Therapy Treatment Patient Details Name: Joseph Hebert MRN: [id number] DOB: 1946-09-11 Today's Date: 02/04/2017    History of Present Illness 71 yo male s/p L 3-4 L4-5 ALIF L 3-s1 pedicule screw fixation with robotic assistance 2/9 hypotensive and hypoxic and transferred to ICU. Intubated 2/10-2/17 for respiratory decompensation. Developed Serratia PNA and being treated with ceftriaxone.     PT Comments    Patient progressing slowly towards PT goals. Demonstrates marked weakness BLEs making standing from low surfaces difficult. Requires assist of 2 for SPT to bed due to impaired balance and weakness. Fatigues quickly sitting EOB without back support. Education re: back precautions, positioning and log roll technique. Recommend geomat for chair and stedy for transfers. Will follow.   Follow Up Recommendations  SNF     Equipment Recommendations  Wheelchair (measurements PT);Wheelchair cushion (measurements PT);Hospital bed;Other (comment)    Recommendations for Other Services OT consult     Precautions / Restrictions Precautions Precautions: Fall;Back Precaution Booklet Issued: No Required Braces or Orthoses: Spinal Brace Spinal Brace: Lumbar corset;Applied in sitting position Restrictions Weight Bearing Restrictions: No    Mobility  Bed Mobility Overal bed mobility: Needs Assistance Bed Mobility: Sit to Sidelying;Rolling Rolling: Mod assist       Sit to sidelying: Mod assist;+2 for physical assistance General bed mobility comments: Cues for log roll technique to return to supine. Assist to bring LEs into bed.   Transfers Overall transfer level: Needs assistance Equipment used: Rolling walker (2 wheeled) Transfers: Sit to/from [facility] Sit to Stand: Max assist;+2 physical assistance Stand pivot transfers: Mod assist;+2 physical assistance;+2 safety/equipment       General transfer comment: Assist of 2 to stand from low recliner with cues  for hand placement/technique. Max cues for hip extension. SPT to bed with Mod A of 2, bil knee instability.   Ambulation/Gait             General Gait Details: unable   Stairs            Wheelchair Mobility    Modified Rankin (Stroke Patients Only)       Balance Overall balance assessment: Needs assistance Sitting-balance support: Feet supported;No upper extremity supported Sitting balance-Leahy Scale: Fair Sitting balance - Comments: Able to sit EOB to blow nose but posterior lean noted, fatigued.  Postural control: Posterior lean Standing balance support: During functional activity;Bilateral upper extremity supported Standing balance-Leahy Scale: Poor Standing balance comment: Reliant on BUEs support in standing and MOd A for hip extension and upright posture.                    Cognition Arousal/Alertness: Awake/alert Behavior During Therapy: Flat affect Overall Cognitive Status: Impaired/Different from baseline Area of Impairment: Memory;Following commands;Problem solving     Memory: Decreased recall of precautions Following Commands: Follows one step commands with increased time     Problem Solving: Slow processing;Requires verbal cues      Exercises      General Comments General comments (skin integrity, edema, etc.): VSS throughout.      Pertinent Vitals/Pain Pain Assessment: No/denies pain    Home Living                      Prior Function            PT Goals (current goals can now be found in the care plan section) Progress towards PT goals: Progressing toward goals    Frequency    Min 5X/week  PT Plan Current plan remains appropriate    Co-evaluation             End of Session Equipment Utilized During Treatment: Gait belt;Back brace Activity Tolerance: Patient tolerated treatment well;Patient limited by fatigue Patient left: in bed;with call bell/phone within reach;with bed alarm set Nurse  Communication: Mobility status;Need for lift equipment (stedy) PT Visit Diagnosis: Muscle weakness (generalized) (M62.81);Unsteadiness on feet (R26.81)     Time: 6283-1517 PT Time Calculation (min) (ACUTE ONLY): 23 min  Charges:  $Therapeutic Activity: 23-37 mins                    G Codes:       Phelan Goers A Inayah Woodin 02/04/2017, 2:34 PM Wray Kearns, [place], DPT [phone]

## 2017-02-05 ENCOUNTER — Inpatient Hospital Stay (HOSPITAL_COMMUNITY): Payer: Medicare Other

## 2017-02-05 LAB — HEPATIC FUNCTION PANEL
ALT: 25 U/L (ref 17–63)
AST: 34 U/L (ref 15–41)
Albumin: 2.3 g/dL — ABNORMAL LOW (ref 3.5–5.0)
Alkaline Phosphatase: 119 U/L (ref 38–126)
BILIRUBIN TOTAL: 0.5 mg/dL (ref 0.3–1.2)
Total Protein: 6.3 g/dL — ABNORMAL LOW (ref 6.5–8.1)

## 2017-02-05 LAB — GLUCOSE, CAPILLARY
GLUCOSE-CAPILLARY: 113 mg/dL — AB (ref 65–99)
GLUCOSE-CAPILLARY: 124 mg/dL — AB (ref 65–99)
Glucose-Capillary: 124 mg/dL — ABNORMAL HIGH (ref 65–99)
Glucose-Capillary: 116 mg/dL — ABNORMAL HIGH (ref 65–99)
Glucose-Capillary: 119 mg/dL — ABNORMAL HIGH (ref 65–99)
Glucose-Capillary: 105 mg/dL — ABNORMAL HIGH (ref 65–99)

## 2017-02-05 LAB — CBC
HCT: 26.7 % — ABNORMAL LOW (ref 39.0–52.0)
Hemoglobin: 8 g/dL — ABNORMAL LOW (ref 13.0–17.0)
MCH: 28.3 pg (ref 26.0–34.0)
MCHC: 30 g/dL (ref 30.0–36.0)
MCV: 94.3 fL (ref 78.0–100.0)
PLATELETS: 742 10*3/uL — AB (ref 150–400)
RBC: 2.83 MIL/uL — AB (ref 4.22–5.81)
RDW: 16.7 % — ABNORMAL HIGH (ref 11.5–15.5)
WBC: 10.4 10*3/uL (ref 4.0–10.5)

## 2017-02-05 LAB — BASIC METABOLIC PANEL
ANION GAP: 8 (ref 5–15)
BUN: 37 mg/dL — ABNORMAL HIGH (ref 6–20)
CALCIUM: 8.7 mg/dL — AB (ref 8.9–10.3)
CO2: 26 mmol/L (ref 22–32)
Chloride: 116 mmol/L — ABNORMAL HIGH (ref 101–111)
Creatinine, Ser: 1.81 mg/dL — ABNORMAL HIGH (ref 0.61–1.24)
GFR, EST AFRICAN AMERICAN: 42 mL/min — AB (ref >60)
GFR, EST NON AFRICAN AMERICAN: 36 mL/min — AB (ref >60)
GLUCOSE: 130 mg/dL — AB (ref 65–99)
POTASSIUM: 3.5 mmol/L (ref 3.5–5.1)
SODIUM: 150 mmol/L — AB (ref 135–145)

## 2017-02-05 LAB — MAGNESIUM: MAGNESIUM: 2.1 mg/dL (ref 1.7–2.4)

## 2017-02-05 LAB — POTASSIUM: Potassium: 3.7 mmol/L (ref 3.5–5.1)

## 2017-02-05 MED ORDER — POLYETHYLENE GLYCOL 3350 17 G PO PACK
17.0000 g | PACK | Freq: Two times a day (BID) | ORAL | Status: DC
  Administered 2017-02-06 – 2017-02-07 (×3): 17 g via ORAL
  Filled 2017-02-05 (×4): qty 1

## 2017-02-05 NOTE — Progress Notes (Signed)
Occupational Therapy Treatment Patient Details Name: Joseph Hebert MRN: [id number] DOB: January 03, 1946 Today's Date: 02/05/2017    History of present illness 71 yo male s/p L 3-4 L4-5 ALIF L 3-s1 pedicule screw fixation with robotic assistance 2/9 hypotensive and hypoxic and transferred to ICU. Intubated 2/10-2/17 for respiratory decompensation. Developed Serratia PNA and being treated with ceftriaxone.    OT comments  Worked on EOC sitting balance and bil. UE exercises.  He fatigues quite quickly and requires encouragement to participate.  He requires max A, overall for ADLs.  Recommend SNF.   Follow Up Recommendations  SNF    Equipment Recommendations  None recommended by OT    Recommendations for Other Services      Precautions / Restrictions Precautions Precautions: Fall;Back Precaution Booklet Issued: No Precaution Comments: Pt able to state 2/3 back precautions  Required Braces or Orthoses: Spinal Brace Spinal Brace: Lumbar corset;Applied in sitting position Restrictions Weight Bearing Restrictions: No       Mobility Bed Mobility Overal bed mobility: Needs Assistance Bed Mobility: Rolling;Sidelying to Sit Rolling: Min assist Sidelying to sit: Max assist;+2 for physical assistance;HOB elevated;+2 for safety/equipment       General bed mobility comments: up in recliner   Transfers Overall transfer level: Needs assistance Equipment used: Rolling walker (2 wheeled) Transfers: Sit to/from Stand Sit to Stand: Max assist;+2 physical assistance Stand pivot transfers: Total assist;+2 physical assistance       General transfer comment: Attempted to stand from elevated bed height x3 but only able to clear bottom minimally despite cues for body momentum. Used stedy to stand x2 and transferred to chair with stedy.     Balance Overall balance assessment: Needs assistance Sitting-balance support: Feet supported;Bilateral upper extremity supported Sitting balance-Leahy  Scale: Poor Sitting balance - Comments: requires bil. UE support.  only able to tolerate EOC sitting x 1 min    Standing balance support: During functional activity;Bilateral upper extremity supported Standing balance-Leahy Scale: Poor Standing balance comment: Reilant on BUEs support in standing and Mod A needed for hip extension, used stedy for standing.                   ADL Overall ADL's : Needs assistance/impaired Eating/Feeding: Set up;Sitting   Grooming: Wash/dry hands;Wash/dry face;Oral care;Brushing hair;Minimal assistance;Sitting   Upper Body Bathing: Moderate assistance;Sitting   Lower Body Bathing: Total assistance   Upper Body Dressing : Total assistance;Sitting   Lower Body Dressing: Total assistance   Toilet Transfer: Total assistance Toilet Transfer Details (indicate cue type and reason): unable due to fatigue  Toileting- Clothing Manipulation and Hygiene: Total assistance                Vision                     Perception     Praxis      Cognition   Behavior During Therapy: Flat affect Overall Cognitive Status: Impaired/Different from baseline Area of Impairment: Attention;Memory;Following commands;Problem solving   Current Attention Level: Selective Memory: Decreased recall of precautions  Following Commands: Follows multi-step commands inconsistently     Problem Solving: Difficulty sequencing;Requires verbal cues;Requires tactile cues General Comments: "I wasn't told that" when asked when he should wear his brace despite being educated on it during yesterday's session. Does not recall therapist from yesterday.      Exercises General Exercises - Upper Extremity Shoulder Flexion: Strengthening;Right;Left;10 reps;Seated Shoulder Extension: Strengthening;Right;Left;10 reps;Seated;AROM Shoulder ABduction: AROM;Right;Left;10 reps;Strengthening;Seated Shoulder ADduction: AROM;Strengthening;Right;Left;10 reps;Seated  Elbow Flexion:  Strengthening;Right;Left;10 reps;Seated Elbow Extension: Strengthening;Right;Left;10 reps;Seated General Exercises - Lower Extremity Hip Flexion/Marching: Both;10 reps;Seated   Shoulder Instructions       General Comments      Pertinent Vitals/ Pain       Pain Assessment: No/denies pain Faces Pain Scale: Hurts even more Pain Location: left groin Pain Descriptors / Indicators: Sore Pain Intervention(s): Monitored during session;Repositioned  Home Living                                          Prior Functioning/Environment              Frequency  Min 2X/week        Progress Toward Goals  OT Goals(current goals can now be found in the care plan section)  Progress towards OT goals: Progressing toward goals     Plan Discharge plan remains appropriate    Co-evaluation                 End of Session Equipment Utilized During Treatment: Back brace;Oxygen  OT Visit Diagnosis: Muscle weakness (generalized) (M62.81)   Activity Tolerance Patient limited by fatigue   Patient Left in chair;with call bell/phone within reach;with chair alarm set   Nurse Communication Need for lift equipment        Time: 4373-5789 OT Time Calculation (min): 24 min  Charges: OT General Charges $OT Visit: 1 Procedure OT Treatments $Therapeutic Activity: 8-22 mins $Therapeutic Exercise: 8-22 mins  [facility], OTR/L [phone]    Lucille Passy M 02/05/2017, 2:07 PM

## 2017-02-05 NOTE — Progress Notes (Signed)
Dr. Tyrell Antonio aware of EKG, Chest xray and lab results. Will continue to monitor.

## 2017-02-05 NOTE — Progress Notes (Signed)
PROGRESS NOTE    Joseph Hebert  YBO:[id number] DOB: 1946-11-25 DOA: 01/22/2017 PCP: [name], MD    Brief Narrative:  Joseph Hebert is a 71 y.o. gentleman with multiple medical issues including PVD (history of LLE bypass, bilateral common iliac stents), CAD, COPD, CKD 3, HTN, HLD, DM, CAD, and chronic back pain who was admitted to the neurosurgery service for ACDF of his lumbar spine.  The patient went to the OR on 2/6.  On 2/9, he was transferred to the ICU for evaluation of hypotension, hypoxia, and acute encephalopathy.  Chest xray was concerning for a LLL pneumonia (concern for aspiration but treated as HCAP).  He was started on vanc and zosyn.  On 2/10 he had a bradycardic arrest; per notes, CPR was performed for less than one minute before ROSC.  He was intubated.  There was no evidence of MI.  Echo on the chart, and cardiology followed immediately after the arrest.  The patient has also had evidence of AKI and he has required transfusion for post operative anemia.  Of note, he also had positive stool heme occult testing but apparently no overt signs of GI bleeding.  Hgb has been 7-7.5 for several days now.  He was extubated successfully on 2/17.  He is tolerating PO intake.  He is ready for transfer out of the ICU. Of note, it appears that cardiology, nephrology, and PCCM have signed off for now.  The patient received vanc from 2/9 - 2/14 The patient received zosyn from 2/9 - 2/12 He has been on Rocephin since 2/12  Sputum culture from 2/9 positive for Serratia Blood cultures from 2/9 NGTD   Assessment & Plan:   Active Problems:   Lumbosacral spondylosis with radiculopathy   Acute respiratory failure with hypoxia (HCC)   Cardiac asystole (HCC)   Cardiogenic shock (HCC)   Acute encephalopathy   Acute hypoxemic respiratory failure (HCC)   HCAP (healthcare-associated pneumonia)   Acute pulmonary edema (HCC)  S/P ACDF lumbar spine Pain management.  Needs Rehab.    Management per neurosurgery.   HCAP, serratia.  Remains on Rocephin; day 8/10.  Extubated successfully 2/17   Acute hypoxic respiratory failure: secondary to PNA and pulmonary edema.  Oxygen sat better on 1 L. Chest x ray:   Acute encephalopathy/delirium: Recommend continuing to minimize sedatives and benzodiazepines. Continuing Neurontin.  Bradycardic arrest during this admission; cardiology thought this was related to vagotonia.   SVT; check electrolytes. EKG. Cardiology consulted. Will defer to cardiology starting any nodal Blocker due to bradycardic arrest this admission.   AKI; Cr last year at 1.8--19 Cr peak to 4 during this admission.  Improving.   Hypernatremia;  Continue with D 5.   DM; SSI.   Anemia with positive stool heme occult Monitor closely, CT abdomen post sx showed stable stranding in the peritoneum on the left extending inferiorly.  daily PPI Monitor for transfusion requirement (Hgb less than 7)   HTN --continue with Hydralazine and Norvasc.       DVT prophylaxis: Heparin  Code Status: Full code.  Family Communication: none at bedside.  Disposition Plan: needs SNF  Consultants:   Neurosurgery is primary    Procedures:  ECHO; Left ventricle: The cavity size was normal. Wall thickness was   increased in a pattern of mild LVH. Systolic function was   vigorous. The estimated ejection fraction was in the range of 65%   to 70%. Wall motion was normal; there were no regional wall  motion abnormalities. Doppler parameters are consistent with   abnormal left ventricular relaxation (grade 1 diastolic   dysfunction). - Right atrium: The atrium was mildly dilated. - Pulmonary arteries: Systolic pressure was moderately increased.   PA peak pressure: 54 mm Hg (S).     Antimicrobials: The patient received vanc from 2/9 - 2/14 The patient received zosyn from 2/9 - 2/12 He has been on Rocephin since 2/12    Subjective: He si alert,  oriented to person. Denies chest pain, or dyspnea.   Objective: Vitals:   02/05/17 0300 02/05/17 0400 02/05/17 0500 02/05/17 0600  BP: (!) 167/68 (!) 148/79 (!) 161/71 (!) 170/73  Pulse: 62 60 (!) 53 60  Resp: 16 16 13 14   Temp:  98.7 F (37.1 C)    TempSrc:  Oral    SpO2: 96% 94% 95% 94%  Weight:   105.6 kg (232 lb 12.9 oz)   Height:        Intake/Output Summary (Last 24 hours) at 02/05/17 0729 Last data filed at 02/05/17 0600  Gross per 24 hour  Intake          1933.33 ml  Output             2575 ml  Net          -641.67 ml   Filed Weights   02/03/17 0400 02/04/17 0412 02/05/17 0500  Weight: 104.7 kg (230 lb 13.2 oz) 105.9 kg (233 lb 7.5 oz) 105.6 kg (232 lb 12.9 oz)    Examination:  General exam: Appears calm and comfortable  Respiratory system: Clear to auscultation. Respiratory effort normal. Cardiovascular system: S1 & S2 heard, RRR. No JVD, murmurs, rubs, gallops or clicks. No pedal edema. Gastrointestinal system: Abdomen is nondistended, soft and nontender. No organomegaly or masses felt. Normal bowel sounds heard. Central nervous system: Alert and oriented. No focal neurological deficits. Extremities: Symmetric 5 x 5 power. Skin: No rashes, lesions or ulcers Psychiatry: Judgement and insight appear normal. Mood & affect appropriate.     Data Reviewed: I have personally reviewed following labs and imaging studies  CBC:  Recent Labs Lab 02/01/17 0603 02/02/17 0530 02/03/17 0547 02/04/17 0410 02/05/17 0313  WBC 12.5* 12.6* 13.1* 10.8* 10.4  HGB 7.2* 7.1* 7.9* 7.6* 8.0*  HCT 24.0* 23.3* 25.6* 25.2* 26.7*  MCV 95.6 95.9 95.2 95.1 94.3  PLT 517* 566* 672* 709* 540*   Basic Metabolic Panel:  Recent Labs Lab 01/31/17 0520 02/01/17 0603 02/02/17 0530 02/03/17 0547 02/04/17 0410 02/05/17 0313  NA 150* 149* 154* 152* 154* 150*  K 3.6 3.4* 3.6 3.6 3.6 3.5  CL 118* 118* 123* 119* 120* 116*  CO2 23 22 23 24 25 26   GLUCOSE 125* 119* 129* 133* 154*  130*  BUN 104* 108* 105* 71* 49* 37*  CREATININE 2.92* 2.85* 2.45* 1.98* 1.75* 1.81*  CALCIUM 8.9 9.1 9.4 9.2 8.7* 8.7*  MG 2.5* 2.5* 2.7* 2.4 2.3  --   PHOS 3.7 3.4 2.6 2.3* 3.2  --    GFR: Estimated Creatinine Clearance: 45.2 mL/min (by C-G formula based on SCr of 1.81 mg/dL (H)). Liver Function Tests:  Recent Labs Lab 02/05/17 0313  AST 34  ALT 25  ALKPHOS 119  BILITOT 0.5  PROT 6.3*  ALBUMIN 2.3*   No results for input(s): LIPASE, AMYLASE in the last 168 hours. No results for input(s): AMMONIA in the last 168 hours. Coagulation Profile: No results for input(s): INR, PROTIME in the last 168 hours. Cardiac Enzymes:  No results for input(s): CKTOTAL, CKMB, CKMBINDEX, TROPONINI in the last 168 hours. BNP (last 3 results) No results for input(s): PROBNP in the last 8760 hours. HbA1C: No results for input(s): HGBA1C in the last 72 hours. CBG:  Recent Labs Lab 02/04/17 1127 02/04/17 1727 02/04/17 1932 02/04/17 2317 02/05/17 0313  GLUCAP 143* 140* 154* 104* 124*   Lipid Profile: No results for input(s): CHOL, HDL, LDLCALC, TRIG, CHOLHDL, LDLDIRECT in the last 72 hours. Thyroid Function Tests: No results for input(s): TSH, T4TOTAL, FREET4, T3FREE, THYROIDAB in the last 72 hours. Anemia Panel: No results for input(s): VITAMINB12, FOLATE, FERRITIN, TIBC, IRON, RETICCTPCT in the last 72 hours. Sepsis Labs: No results for input(s): PROCALCITON, LATICACIDVEN in the last 168 hours.  Recent Results (from the past 240 hour(s))  Culture, respiratory (NON-Expectorated)     Status: None   Collection Time: 01/26/17  2:35 PM  Result Value Ref Range Status   Specimen Description TRACHEAL ASPIRATE  Final   Special Requests Normal  Final   Gram Stain   Final    MODERATE WBC PRESENT, PREDOMINANTLY PMN RARE GRAM NEGATIVE RODS RARE GRAM POSITIVE RODS RARE BUDDING YEAST SEEN    Culture FEW SERRATIA MARCESCENS  Final   Report Status 01/29/2017 FINAL  Final   Organism ID,  Bacteria SERRATIA MARCESCENS  Final      Susceptibility   Serratia marcescens - MIC*    CEFAZOLIN >=64 RESISTANT Resistant     CEFEPIME <=1 SENSITIVE Sensitive     CEFTAZIDIME <=1 SENSITIVE Sensitive     CEFTRIAXONE <=1 SENSITIVE Sensitive     CIPROFLOXACIN <=0.25 SENSITIVE Sensitive     GENTAMICIN <=1 SENSITIVE Sensitive     TRIMETH/SULFA <=20 SENSITIVE Sensitive     * FEW SERRATIA MARCESCENS         Radiology Studies: Dg Abd Portable 1v  Result Date: 02/03/2017 CLINICAL DATA:  Vomiting, was extubated yesterday EXAM: PORTABLE ABDOMEN - 1 VIEW COMPARISON:  Portable exam 1250 hours compared to 01/30/2017 FINDINGS: Interval removal of nasogastric tube. Nonobstructive bowel gas pattern. No bowel dilatation or bowel wall thickening. Prior lumbosacral fusion L3-S1. BILATERAL common iliac artery stents. No urinary tract calcification. IMPRESSION: Normal bowel gas pattern. Electronically Signed   By: Lavonia Dana M.D.   On: 02/03/2017 12:49        Scheduled Meds: . acetaminophen  650 mg Oral Q6H  . amLODipine  5 mg Oral Daily  . aspirin  81 mg Oral Daily  . atorvastatin  80 mg Oral QHS  . cefTRIAXone (ROCEPHIN)  IV  2 g Intravenous Q24H  . diphenhydrAMINE-zinc acetate   Topical Q8H  . docusate sodium  100 mg Oral BID  . feeding supplement (ENSURE ENLIVE)  237 mL Oral BID BM  . gabapentin  300 mg Oral Q8H  . heparin subcutaneous  5,000 Units Subcutaneous Q8H  . hydrALAZINE  25 mg Oral BID  . insulin aspart  2-6 Units Subcutaneous TID WC  . nystatin   Topical TID  . pantoprazole  40 mg Oral Q1200  . sodium chloride flush  3 mL Intravenous Q12H   Continuous Infusions: . sodium chloride 10 mL/hr at 01/26/17 2000  . sodium chloride    . dextrose 100 mL/hr at 02/04/17 2340     LOS: 14 days    Time spent: 35 minutes.     Elmarie Shiley, MD [facility] Pager [phone]  If 7PM-7AM, please contact night-coverage [email or web address] Password [facility] 02/05/2017, 7:29  AM

## 2017-02-05 NOTE — Progress Notes (Addendum)
While resting, patient's 02 sats around 88-90% on RA. When patient turned on side for peri care, patient's 02 sats decreased to 83%. Patient returned to supine and placed on nasal cannula, sats increased to low 90s, 02 currently @ 1 LPM. Dr. Tyrell Antonio was made aware, saturations goal ordered per MD @ 90-92%. Telemetry also ordered for patient's transfer to floor d/t h/o bradycardia this admission per Dr. Tyrell Antonio.

## 2017-02-05 NOTE — Progress Notes (Signed)
   We were asked to comment on his bradycardia I have reviewed the recent tele.  He has had episodes of bradycardia  - most often in the early AM hours. No significant pauses. Absolutely no symptoms at all No syncope   Had 1 brief nonsustained run of idioventricular rhythm.   Resolved on its own.   He had an asytolic arrest on Feb. 10 and has been evaluated for this . It was thought to be due to enhanced vagal tone.   At this point,  We no additional recommendations He is completely asymptomatic. He has not indications for pacer I would continue to hold his beta blocker   No new recs. Call for questions     Mertie Moores, MD  02/05/2017 6:32 PM    [address],  [place], [place]  [address] Pager [phone] Phone: [phone]; Fax: [phone]

## 2017-02-05 NOTE — Progress Notes (Addendum)
Patient noted to have a 15 beat ventricular rhythm, QRS .12, no p waves noted, followed by NSR. Strip sent to chart. Dr. Tyrell Antonio notified. Orders received, also cancel transfer to floor at this time. Patient alert, NAD. Will continue to monitor.   Update: MD paged and notified of EKG completion.

## 2017-02-05 NOTE — Progress Notes (Signed)
Doing well Some bradycardia overnight as well as hypoxemia Awake and alert and oriented Moving legs well Stable Transfer to floor

## 2017-02-05 NOTE — Progress Notes (Signed)
  Speech Language Pathology Treatment: Dysphagia  Patient Details Name: CHRISTOPHE RISING MRN: [id number] DOB: 1946/08/16 Today's Date: 02/05/2017 Time: 5501-5868 SLP Time Calculation (min) (ACUTE ONLY): 9 min  Assessment / Plan / Recommendation Clinical Impression  Pt shows no overt signs of aspiration or dysphagia with thin liquids and regular solids under direct SLP supervision. Pt initially denies any subjective complaints, but then later shares that food has been coming "back up". He believes that it sometimes feels "stuck" and points toward his sternum, and that it will be regurgitated after that. RN notified and reports no nursing report of this happening. SLP provided education about aspiration and esophageal precautions to help with these symptoms. Suspect that they are more esophageal in nature. No indications of an oropharyngeal dysphagia at this time, therefore SLP to sign off.   HPI HPI: 71 yo male with PMH notable for hiatal hernia, gastric ulcer, COPD, dementia, depression, memory impairment, DM, refractory hypertension, PVD post left fem pop, CAD, Dyspnea on exertion who had anterior abd approach l3-5 fusion on 2/6. 2/9 he was hypotensive and hypoxic and transferred to ICU. Intubated 2/10 for respiratory decompensation. Developed Serratia PNA and being treated with ceftriaxone. Extubated 02/02/17 ~12:30pm. Seen for swallowing evaluation. Most recent CXR showed persistent patchy left basilar opacity, which may reflect atelectasis and/or infiltrate. Interval improvement in pulmonary vascular congestion as compared 02/01/2017.      SLP Plan  All goals met       Recommendations  Diet recommendations: Regular;Thin liquid Liquids provided via: Cup;Straw Medication Administration: Whole meds with puree Supervision: Patient able to self feed;Intermittent supervision to cue for compensatory strategies Compensations: Slow rate;Small sips/bites;Minimize environmental distractions;Follow  solids with liquid Postural Changes and/or Swallow Maneuvers: Seated upright 90 degrees;Upright 30-60 min after meal                Oral Care Recommendations: Oral care BID Follow up Recommendations: None SLP Visit Diagnosis: Dysphagia, unspecified (R13.10) Plan: All goals met       GO                Germain Osgood 02/05/2017, 9:59 AM  Germain Osgood, M.A. CCC-SLP [phone]

## 2017-02-05 NOTE — Progress Notes (Signed)
Physical Therapy Treatment Patient Details Name: Joseph Hebert MRN: [id number] DOB: 1946/01/22 Today's Date: 02/05/2017    History of Present Illness 71 yo male s/p L 3-4 L4-5 ALIF L 3-s1 pedicule screw fixation with robotic assistance 2/9 hypotensive and hypoxic and transferred to ICU. Intubated 2/10-2/17 for respiratory decompensation. Developed Serratia PNA and being treated with ceftriaxone.     PT Comments    Patient demonstrated marked weakness BLEs, left >right, impaired memory and delayed processing. Not able to stand from EOB with Max A of 2 despite max verbal/tactile cues. Required use of stedy to transfer to chair. Reviewed back precautions. Pt not able to recall therapists from yesterday or back precautions/brace. Encouraged RN to use stedy vs maxisky for transfer back to bed. Will follow.    Follow Up Recommendations  SNF     Equipment Recommendations  Wheelchair (measurements PT);Wheelchair cushion (measurements PT);Hospital bed;Other (comment)    Recommendations for Other Services       Precautions / Restrictions Precautions Precautions: Fall;Back Precaution Booklet Issued: No Required Braces or Orthoses: Spinal Brace Spinal Brace: Lumbar corset;Applied in sitting position Restrictions Weight Bearing Restrictions: No    Mobility  Bed Mobility Overal bed mobility: Needs Assistance Bed Mobility: Rolling;Sidelying to Sit Rolling: Min assist Sidelying to sit: Max assist;+2 for physical assistance;HOB elevated;+2 for safety/equipment       General bed mobility comments: Step by step cues for log roll technique; assist to bring LEs to EOB and with trunk.  Transfers Overall transfer level: Needs assistance Equipment used: Rolling walker (2 wheeled) Transfers: Sit to/from Stand Sit to Stand: Max assist;+2 physical assistance Stand pivot transfers: Total assist;+2 physical assistance       General transfer comment: Attempted to stand from elevated bed  height x3 but only able to clear bottom minimally despite cues for body momentum. Used stedy to stand x2 and transferred to chair with stedy.   Ambulation/Gait                 Stairs            Wheelchair Mobility    Modified Rankin (Stroke Patients Only)       Balance Overall balance assessment: Needs assistance Sitting-balance support: Feet supported;Bilateral upper extremity supported Sitting balance-Leahy Scale: Poor Sitting balance - Comments: Pt with heavy posterior lean sitting EOB, once ablet o hold onto RW only Min gaurd assist but needed to stabilize RW to prevent posterior LOB.   Standing balance support: During functional activity;Bilateral upper extremity supported Standing balance-Leahy Scale: Poor Standing balance comment: Reilant on BUEs support in standing and Mod A needed for hip extension, used stedy for standing.                    Cognition Arousal/Alertness: Awake/alert Behavior During Therapy: Flat affect Overall Cognitive Status: Impaired/Different from baseline Area of Impairment: Memory     Memory: Decreased recall of precautions;Decreased short-term memory Following Commands: Follows one step commands with increased time     Problem Solving: Slow processing;Requires verbal cues General Comments: "I wasn't told that" when asked when he should wear his brace despite being educated on it during yesterday's session. Does not recall therapist from yesterday.    Exercises General Exercises - Lower Extremity Hip Flexion/Marching: Both;10 reps;Seated    General Comments General comments (skin integrity, edema, etc.): Sp02 remained >90% on 1L/min 02.      Pertinent Vitals/Pain Pain Assessment: Faces Faces Pain Scale: Hurts even more Pain Location: left groin  Pain Descriptors / Indicators: Sore Pain Intervention(s): Monitored during session;Repositioned    Home Living                      Prior Function             PT Goals (current goals can now be found in the care plan section) Progress towards PT goals: Progressing toward goals (slowly)    Frequency    Min 5X/week      PT Plan Current plan remains appropriate    Co-evaluation             End of Session Equipment Utilized During Treatment: Gait belt;Back brace Activity Tolerance: Patient tolerated treatment well;Patient limited by fatigue Patient left: in chair;with call bell/phone within reach;with nursing/sitter in room;with chair alarm set Nurse Communication: Mobility status;Need for lift equipment PT Visit Diagnosis: Unsteadiness on feet (R26.81);Muscle weakness (generalized) (M62.81)     Time: 8867-7373 PT Time Calculation (min) (ACUTE ONLY): 32 min  Charges:  $Therapeutic Activity: 23-37 mins                    G Codes:       Mitchell Iwanicki A Kyshon Tolliver 02/05/2017, 11:34 AM Wray Kearns, PT, DPT [phone]

## 2017-02-06 LAB — CBC
HEMATOCRIT: 26.2 % — AB (ref 39.0–52.0)
HEMOGLOBIN: 8 g/dL — AB (ref 13.0–17.0)
MCH: 28.5 pg (ref 26.0–34.0)
MCHC: 30.5 g/dL (ref 30.0–36.0)
MCV: 93.2 fL (ref 78.0–100.0)
Platelets: 712 10*3/uL — ABNORMAL HIGH (ref 150–400)
RBC: 2.81 MIL/uL — ABNORMAL LOW (ref 4.22–5.81)
RDW: 16.2 % — AB (ref 11.5–15.5)
WBC: 11.3 10*3/uL — ABNORMAL HIGH (ref 4.0–10.5)

## 2017-02-06 LAB — BASIC METABOLIC PANEL
ANION GAP: 9 (ref 5–15)
BUN: 24 mg/dL — ABNORMAL HIGH (ref 6–20)
CALCIUM: 8.4 mg/dL — AB (ref 8.9–10.3)
CHLORIDE: 108 mmol/L (ref 101–111)
CO2: 23 mmol/L (ref 22–32)
CREATININE: 1.6 mg/dL — AB (ref 0.61–1.24)
GFR calc non Af Amer: 42 mL/min — ABNORMAL LOW (ref >60)
GFR, EST AFRICAN AMERICAN: 48 mL/min — AB (ref >60)
GLUCOSE: 115 mg/dL — AB (ref 65–99)
Potassium: 3.2 mmol/L — ABNORMAL LOW (ref 3.5–5.1)
Sodium: 140 mmol/L (ref 135–145)

## 2017-02-06 LAB — GLUCOSE, CAPILLARY
GLUCOSE-CAPILLARY: 114 mg/dL — AB (ref 65–99)
GLUCOSE-CAPILLARY: 107 mg/dL — AB (ref 65–99)
Glucose-Capillary: 104 mg/dL — ABNORMAL HIGH (ref 65–99)
Glucose-Capillary: 109 mg/dL — ABNORMAL HIGH (ref 65–99)
Glucose-Capillary: 99 mg/dL (ref 65–99)

## 2017-02-06 MED ORDER — INSULIN ASPART 100 UNIT/ML ~~LOC~~ SOLN: 0.0000 [IU] | Freq: Three times a day (TID) | SUBCUTANEOUS | Status: DC

## 2017-02-06 MED ORDER — POTASSIUM CHLORIDE CRYS ER 20 MEQ PO TBCR
40.0000 meq | EXTENDED_RELEASE_TABLET | Freq: Once | ORAL | Status: AC
  Administered 2017-02-06: 40 meq via ORAL
  Filled 2017-02-06: qty 2

## 2017-02-06 MED ORDER — GABAPENTIN 300 MG PO CAPS
300.0000 mg | ORAL_CAPSULE | Freq: Three times a day (TID) | ORAL | Status: DC
  Administered 2017-02-06 – 2017-02-08 (×7): 300 mg via ORAL
  Filled 2017-02-06 (×7): qty 1

## 2017-02-06 NOTE — Progress Notes (Signed)
New Admission Note:  Arrival Method: wheelchair Mental Orientation: Ale to self, place, situation ; disoriented to time  Telemetry: box 3 Assessment: Completed Skin: surgical incision  IV: saline locked Pain: medicated prior to arriving  Tubes: Safety Measures: Safety Fall Prevention Plan was, discussed and  Admission: Completed 5M04: Patient has been orientated to the room, unit and the staff. Family: updated  Orders have been reviewed and implemented. Will continue to monitor the patient. Call light has been placed within reach and bed alarm has been activated.   Arta Silence ,RN

## 2017-02-06 NOTE — Clinical Social Work Note (Signed)
Clinical Social Worker continuing to follow patient and family for support and discharge planning needs.  CSW spoke with patient wife over the phone to discuss bed offers.  CSW has been in communication with [facility] and they request patient therapy notes to see marked improvement prior to offering a bed.  Patient wife plans to reach out to friends/family to discuss [facility] and [place].  CSW continuing to follow for support and to facilitate patient discharge needs once medically stable.  Barbette Or, [place]

## 2017-02-06 NOTE — Progress Notes (Signed)
Physical Therapy Treatment Patient Details Name: Joseph Hebert MRN: [id number] DOB: 1946-02-27 Today's Date: 02/06/2017    History of Present Illness 71 yo male s/p L 3-4 L4-5 ALIF L 3-s1 pedicule screw fixation with robotic assistance 2/9 hypotensive and hypoxic and transferred to ICU. Intubated 2/10-2/17 for respiratory decompensation. Developed Serratia PNA and being treated with ceftriaxone.     PT Comments    Patient progressing well towards PT goals. Continues to require assist with standing due to weakness in BLEs. Better able to perform bed mobility with less assist today but requires increased time and effort with multiple rest breaks. Motivated to get to rehab and go home. Will continue to follow.    Follow Up Recommendations  SNF     Equipment Recommendations  Wheelchair (measurements PT);Wheelchair cushion (measurements PT);Hospital bed;Other (comment)    Recommendations for Other Services       Precautions / Restrictions Precautions Precautions: Fall;Back Precaution Booklet Issued: No Required Braces or Orthoses: Spinal Brace Spinal Brace: Lumbar corset;Applied in sitting position Restrictions Weight Bearing Restrictions: No    Mobility  Bed Mobility Overal bed mobility: Needs Assistance Bed Mobility: Rolling;Sidelying to Sit Rolling: Min guard Sidelying to sit: Min guard;HOB elevated     Sit to sidelying: Mod assist;HOB elevated General bed mobility comments: Increased time to get to EOB with use of rail and cues for sequencing for log roll technique. Mod A to bring LEs to return to sidelying.  Transfers Overall transfer level: Needs assistance Equipment used: None Transfers: Sit to/from Stand Sit to Stand: Mod assist;From elevated surface         General transfer comment: mod A to stand using stedy x3, assist with hip extension. Performed pericare total A.  Ambulation/Gait                 Stairs            Wheelchair Mobility     Modified Rankin (Stroke Patients Only)       Balance Overall balance assessment: Needs assistance Sitting-balance support: Feet supported;Bilateral upper extremity supported Sitting balance-Leahy Scale: Fair Sitting balance - Comments: Able to sit EOB with BUE support; able to donn LSO with setup. Posterior lean noted with dynamic sitting EOB. Postural control: Posterior lean Standing balance support: During functional activity;Bilateral upper extremity supported Standing balance-Leahy Scale: Poor Standing balance comment: Reliant on BUEs for support in standing and Mod A needed for hip extension used stedy for standing. Stood for ~30 sec, 35 sec, 20 sec.                    Cognition Arousal/Alertness: Awake/alert Behavior During Therapy: Flat affect Overall Cognitive Status: Impaired/Different from baseline Area of Impairment: Memory;Following commands;Attention   Current Attention Level: Selective Memory: Decreased recall of precautions;Decreased short-term memory Following Commands: Follows multi-step commands inconsistently     Problem Solving: Difficulty sequencing;Requires verbal cues;Requires tactile cues      Exercises      General Comments        Pertinent Vitals/Pain Pain Assessment: Faces Faces Pain Scale: Hurts even more Pain Location: back Pain Descriptors / Indicators: Sore;Operative site guarding Pain Intervention(s): Monitored during session;Repositioned    Home Living                      Prior Function            PT Goals (current goals can now be found in the care plan section) Progress towards PT  goals: Progressing toward goals    Frequency    Min 5X/week      PT Plan Current plan remains appropriate    Co-evaluation             End of Session Equipment Utilized During Treatment: Gait belt;Back brace Activity Tolerance: Patient tolerated treatment well Patient left: in bed;with call bell/phone within  reach;with bed alarm set Nurse Communication: Mobility status;Need for lift equipment PT Visit Diagnosis: Unsteadiness on feet (R26.81);Muscle weakness (generalized) (M62.81)     Time: 3818-2993 PT Time Calculation (min) (ACUTE ONLY): 28 min  Charges:  $Therapeutic Activity: 23-37 mins                    G Codes:       Odile Veloso A Vaeda Westall 02/06/2017, 2:57 PM Wray Kearns, [place], DPT [phone]

## 2017-02-06 NOTE — Progress Notes (Signed)
Pt seen and examined. No issues overnight.  EXAM: Temp:  [97.7 F (36.5 C)-99.1 F (37.3 C)] 97.9 F (36.6 C) (02/21 0800) Pulse Rate:  [53-84] 84 (02/21 0800) Resp:  [12-24] 24 (02/21 0800) BP: (125-172)/(49-119) 172/86 (02/21 0800) SpO2:  [89 %-97 %] 92 % (02/21 0800) Weight:  [108.1 kg (238 lb 5.1 oz)] 108.1 kg (238 lb 5.1 oz) (02/21 0500) Intake/Output      02/20 0701 - 02/21 0700 02/21 0701 - 02/22 0700   P.O. 480 120   I.V. (mL/kg) 1257.2 (11.6) 39.2 (0.4)   IV Piggyback 50    Total Intake(mL/kg) 1787.2 (16.5) 159.2 (1.5)   Urine (mL/kg/hr) 1890 (0.7) 360 (1.7)   Emesis/NG output     Stool 0 (0) 0 (0)   Total Output 1890 360   Net -102.8 -200.8        Urine Occurrence 1 x    Stool Occurrence 4 x 1 x    Awake and alert Follows commands throughout Full strength  Stable Transfer to floor Dispo planning

## 2017-02-06 NOTE — Progress Notes (Signed)
PROGRESS NOTE    CARRON JAGGI  NKN:[id number] DOB: [birth year] DOA: 01/22/2017 PCP: [name], MD    Brief Narrative:  Joseph Hebert is a 71 y.o. gentleman with multiple medical issues including PVD (history of LLE bypass, bilateral common iliac stents), CAD, COPD, CKD 3, HTN, HLD, DM, CAD, and chronic back pain who was admitted to the neurosurgery service for ACDF of his lumbar spine.  The patient went to the OR on 2/6.  On 2/9, he was transferred to the ICU for evaluation of hypotension, hypoxia, and acute encephalopathy.  Chest xray was concerning for a LLL pneumonia (concern for aspiration but treated as HCAP).  He was started on vanc and zosyn.  On 2/10 he had a bradycardic arrest; per notes, CPR was performed for less than one minute before ROSC.  He was intubated.  There was no evidence of MI.  Echo on the chart, and cardiology followed immediately after the arrest.  The patient has also had evidence of AKI and he has required transfusion for post operative anemia.  Of note, he also had positive stool heme occult testing but apparently no overt signs of GI bleeding.  Hgb has been 7-7.5 for several days now.  He was extubated successfully on 2/17.  He is tolerating PO intake.  He is ready for transfer out of the ICU. Of note, it appears that cardiology, nephrology, and PCCM have signed off for now.  The patient received vanc from 2/9 - 2/14 The patient received zosyn from 2/9 - 2/12 He has been on Rocephin since 2/12  Sputum culture from 2/9 positive for Serratia Blood cultures from 2/9 NGTD   Assessment & Plan:   Active Problems:   Lumbosacral spondylosis with radiculopathy   Acute respiratory failure with hypoxia (HCC)   Cardiac asystole (HCC)   Cardiogenic shock (HCC)   Acute encephalopathy   Acute hypoxemic respiratory failure (HCC)   HCAP (healthcare-associated pneumonia)   Acute pulmonary edema (HCC)  1-S/P ACDF lumbar spine Pain management.  Needs Rehab.    Management per neurosurgery.   2-HCAP, serratia PNA.  Remains on Rocephin; day 9/10.  Extubated successfully 2/17 Improving.   Acute hypoxic respiratory failure: secondary to PNA and pulmonary edema.  Oxygen sat better on 2 L. Chest x ray: stable.  NSL fluids. Consider resuming home lasix if sodium remain stable.   Acute encephalopathy/delirium: Recommend continuing to minimize sedatives and benzodiazepines. Continuing Neurontin. Improving.   Bradycardic arrest during this admission; cardiology thought this was related to vagotonia.  Avoid Beta-blocker.   SVT; Cardiology consulted. No further evaluation per Dr Acie Fredrickson.  Mg normal.   AKI; Cr last year at 1.8--19 Cr peak to 4 during this admission.  Improving.   Hypernatremia;  Resolved with D 5.  Follow labs.   DM; SSI.   Anemia with positive stool heme occult Monitor closely, CT abdomen post sx showed stable stranding in the peritoneum on the left extending inferiorly.  daily PPI Monitor for transfusion requirement (Hgb less than 7) Needs outpatient colonoscopy   HTN --continue with Hydralazine and Norvasc.  --Avoid Beta blocker due to bradycardiac arrest this admission.   Hypokalemia; replete   DVT prophylaxis: Heparin  Code Status: Full code.  Family Communication: none at bedside.  Disposition Plan: needs SNF  Consultants:   Neurosurgery is primary   Cardiology   CCM  Nephrology; sign off.    Procedures:  ECHO; Left ventricle: The cavity size was normal. Wall thickness was   increased  in a pattern of mild LVH. Systolic function was   vigorous. The estimated ejection fraction was in the range of 65%   to 70%. Wall motion was normal; there were no regional wall   motion abnormalities. Doppler parameters are consistent with   abnormal left ventricular relaxation (grade 1 diastolic   dysfunction). - Right atrium: The atrium was mildly dilated. - Pulmonary arteries: Systolic pressure was  moderately increased.   PA peak pressure: 54 mm Hg (S).     Antimicrobials: The patient received vanc from 2/9 - 2/14 The patient received zosyn from 2/9 - 2/12 He has been on Rocephin since 2/12    Subjective: Patient is alert, oriented to person , place. He denies difficulty swallowing. He is eating more. He doesn't like hospital food.  He denies dyspnea.   Objective: Vitals:   02/06/17 0400 02/06/17 0500 02/06/17 0600 02/06/17 0700  BP: (!) 153/78 (!) 166/68 (!) 162/69 (!) 164/70  Pulse: 68 68 65 62  Resp: 16 17 12 15   Temp: 98.3 F (36.8 C)     TempSrc: Oral     SpO2: 93% 94% 96% 94%  Weight:  108.1 kg (238 lb 5.1 oz)    Height:        Intake/Output Summary (Last 24 hours) at 02/06/17 0754 Last data filed at 02/06/17 0747  Gross per 24 hour  Intake          1826.34 ml  Output             1890 ml  Net           -63.66 ml   Filed Weights   02/04/17 0412 02/05/17 0500 02/06/17 0500  Weight: 105.9 kg (233 lb 7.5 oz) 105.6 kg (232 lb 12.9 oz) 108.1 kg (238 lb 5.1 oz)    Examination:  General exam: Appears calm and comfortable  Respiratory system: Clear to auscultation. Respiratory effort normal. Cardiovascular system: S1 & S2 heard, RRR. No JVD, murmurs, rubs, gallops or clicks. No pedal edema. Gastrointestinal system: Abdomen is nondistended, soft and nontender. No organomegaly or masses felt. Normal bowel sounds heard. Incision left lower quadrant, healing  Central nervous system: Alert and oriented. No focal neurological deficits. Extremities: Symmetric 5 x 5 power. Skin: No rashes, lesions or ulcers Psychiatry: Judgement and insight appear normal. Mood & affect appropriate.     Data Reviewed: I have personally reviewed following labs and imaging studies  CBC:  Recent Labs Lab 02/02/17 0530 02/03/17 0547 02/04/17 0410 02/05/17 0313 02/06/17 0333  WBC 12.6* 13.1* 10.8* 10.4 11.3*  HGB 7.1* 7.9* 7.6* 8.0* 8.0*  HCT 23.3* 25.6* 25.2* 26.7* 26.2*   MCV 95.9 95.2 95.1 94.3 93.2  PLT 566* 672* 709* 742* 086*   Basic Metabolic Panel:  Recent Labs Lab 01/31/17 0520 02/01/17 0603 02/02/17 0530 02/03/17 0547 02/04/17 0410 02/05/17 0313 02/05/17 1027 02/06/17 0333  NA 150* 149* 154* 152* 154* 150*  --  140  K 3.6 3.4* 3.6 3.6 3.6 3.5 3.7 3.2*  CL 118* 118* 123* 119* 120* 116*  --  108  CO2 23 22 23 24 25 26   --  23  GLUCOSE 125* 119* 129* 133* 154* 130*  --  115*  BUN 104* 108* 105* 71* 49* 37*  --  24*  CREATININE 2.92* 2.85* 2.45* 1.98* 1.75* 1.81*  --  1.60*  CALCIUM 8.9 9.1 9.4 9.2 8.7* 8.7*  --  8.4*  MG 2.5* 2.5* 2.7* 2.4 2.3  --  2.1  --  PHOS 3.7 3.4 2.6 2.3* 3.2  --   --   --    GFR: Estimated Creatinine Clearance: 51.8 mL/min (by C-G formula based on SCr of 1.6 mg/dL (H)). Liver Function Tests:  Recent Labs Lab 02/05/17 0313  AST 34  ALT 25  ALKPHOS 119  BILITOT 0.5  PROT 6.3*  ALBUMIN 2.3*   No results for input(s): LIPASE, AMYLASE in the last 168 hours. No results for input(s): AMMONIA in the last 168 hours. Coagulation Profile: No results for input(s): INR, PROTIME in the last 168 hours. Cardiac Enzymes: No results for input(s): CKTOTAL, CKMB, CKMBINDEX, TROPONINI in the last 168 hours. BNP (last 3 results) No results for input(s): PROBNP in the last 8760 hours. HbA1C: No results for input(s): HGBA1C in the last 72 hours. CBG:  Recent Labs Lab 02/05/17 1731 02/05/17 1924 02/05/17 2338 02/06/17 0344 02/06/17 0748  GLUCAP 119* 105* 113* 104* 114*   Lipid Profile: No results for input(s): CHOL, HDL, LDLCALC, TRIG, CHOLHDL, LDLDIRECT in the last 72 hours. Thyroid Function Tests: No results for input(s): TSH, T4TOTAL, FREET4, T3FREE, THYROIDAB in the last 72 hours. Anemia Panel: No results for input(s): VITAMINB12, FOLATE, FERRITIN, TIBC, IRON, RETICCTPCT in the last 72 hours. Sepsis Labs: No results for input(s): PROCALCITON, LATICACIDVEN in the last 168 hours.  No results found for  this or any previous visit (from the past 240 hour(s)).       Radiology Studies: Dg Chest Port 1 View  Result Date: 02/05/2017 CLINICAL DATA:  Vomiting this morning, hypoxemia EXAM: PORTABLE CHEST 1 VIEW COMPARISON:  02/03/2017 FINDINGS: Cardiomediastinal silhouette is stable. Persistent mild reticular interstitial prominence right perihilar probable chronic in nature. Streaky right base medially atelectasis or residual infiltrate with improvement from prior exam. Trace bilateral pleural effusion. No pulmonary edema. Atherosclerotic calcifications of thoracic aorta again noted. IMPRESSION: Persistent mild reticular interstitial prominence right perihilar probable chronic in nature. Streaky right base medially atelectasis or residual infiltrate with improvement from prior exam. Trace bilateral pleural effusion. No pulmonary edema. Electronically Signed   By: Lahoma Crocker M.D.   On: 02/05/2017 09:14        Scheduled Meds: . acetaminophen  650 mg Oral Q6H  . amLODipine  5 mg Oral Daily  . aspirin  81 mg Oral Daily  . atorvastatin  80 mg Oral QHS  . cefTRIAXone (ROCEPHIN)  IV  2 g Intravenous Q24H  . diphenhydrAMINE-zinc acetate   Topical Q8H  . docusate sodium  100 mg Oral BID  . feeding supplement (ENSURE ENLIVE)  237 mL Oral BID BM  . gabapentin  300 mg Oral TID  . heparin subcutaneous  5,000 Units Subcutaneous Q8H  . hydrALAZINE  25 mg Oral BID  . insulin aspart  2-6 Units Subcutaneous TID WC  . nystatin   Topical TID  . pantoprazole  40 mg Oral Q1200  . polyethylene glycol  17 g Oral BID  . potassium chloride  40 mEq Oral Once  . sodium chloride flush  3 mL Intravenous Q12H   Continuous Infusions: . sodium chloride 10 mL/hr at 01/26/17 2000  . sodium chloride       LOS: 15 days    Time spent: 35 minutes.     Elmarie Shiley, MD [facility] Pager [phone]  If 7PM-7AM, please contact night-coverage [email or web address] Password [email or web address] 02/06/2017, 7:54 AM

## 2017-02-07 LAB — BASIC METABOLIC PANEL
ANION GAP: 11 (ref 5–15)
BUN: 18 mg/dL (ref 6–20)
CHLORIDE: 108 mmol/L (ref 101–111)
CO2: 23 mmol/L (ref 22–32)
Calcium: 8.6 mg/dL — ABNORMAL LOW (ref 8.9–10.3)
Creatinine, Ser: 1.45 mg/dL — ABNORMAL HIGH (ref 0.61–1.24)
GFR calc Af Amer: 54 mL/min — ABNORMAL LOW (ref >60)
GFR, EST NON AFRICAN AMERICAN: 47 mL/min — AB (ref >60)
GLUCOSE: 105 mg/dL — AB (ref 65–99)
POTASSIUM: 3.6 mmol/L (ref 3.5–5.1)
Sodium: 142 mmol/L (ref 135–145)

## 2017-02-07 LAB — CBC
HCT: 25.1 % — ABNORMAL LOW (ref 39.0–52.0)
HEMOGLOBIN: 7.9 g/dL — AB (ref 13.0–17.0)
MCH: 29 pg (ref 26.0–34.0)
MCHC: 31.5 g/dL (ref 30.0–36.0)
MCV: 92.3 fL (ref 78.0–100.0)
Platelets: 633 10*3/uL — ABNORMAL HIGH (ref 150–400)
RBC: 2.72 MIL/uL — AB (ref 4.22–5.81)
RDW: 16 % — ABNORMAL HIGH (ref 11.5–15.5)
WBC: 9.4 10*3/uL (ref 4.0–10.5)

## 2017-02-07 LAB — GLUCOSE, CAPILLARY
GLUCOSE-CAPILLARY: 93 mg/dL (ref 65–99)
GLUCOSE-CAPILLARY: 100 mg/dL — AB (ref 65–99)
GLUCOSE-CAPILLARY: 89 mg/dL (ref 65–99)
Glucose-Capillary: 117 mg/dL — ABNORMAL HIGH (ref 65–99)

## 2017-02-07 MED ORDER — FUROSEMIDE 40 MG PO TABS
40.0000 mg | ORAL_TABLET | Freq: Two times a day (BID) | ORAL | Status: DC
  Administered 2017-02-07 – 2017-02-08 (×2): 40 mg via ORAL
  Filled 2017-02-07 (×2): qty 1

## 2017-02-07 MED ORDER — POTASSIUM CHLORIDE CRYS ER 20 MEQ PO TBCR
40.0000 meq | EXTENDED_RELEASE_TABLET | Freq: Once | ORAL | Status: AC
  Administered 2017-02-07: 40 meq via ORAL
  Filled 2017-02-07: qty 2

## 2017-02-07 MED ORDER — FLUTICASONE PROPIONATE 50 MCG/ACT NA SUSP
2.0000 | Freq: Every day | NASAL | Status: DC
  Administered 2017-02-07 – 2017-02-08 (×2): 2 via NASAL
  Filled 2017-02-07: qty 16

## 2017-02-07 MED ORDER — LORATADINE 10 MG PO TABS
10.0000 mg | ORAL_TABLET | Freq: Every day | ORAL | Status: DC
  Administered 2017-02-07 – 2017-02-08 (×2): 10 mg via ORAL
  Filled 2017-02-07 (×2): qty 1

## 2017-02-07 MED ORDER — DEXTROSE 5 % IV SOLN
2.0000 g | INTRAVENOUS | Status: AC
  Administered 2017-02-07: 2 g via INTRAVENOUS
  Filled 2017-02-07: qty 2

## 2017-02-07 NOTE — Progress Notes (Signed)
Occupational Therapy Treatment Patient Details Name: Joseph Hebert MRN: [id number] DOB: 01/19/1946 Today's Date: 02/07/2017    History of present illness 71 yo male s/p L 3-4 L4-5 ALIF L 3-s1 pedicule screw fixation with robotic assistance 2/9 hypotensive and hypoxic and transferred to ICU. Intubated 2/10-2/17 for respiratory decompensation. Developed Serratia PNA and being treated with ceftriaxone.    OT comments  Initial time for goal achievement met. Pt progressing toward initial goals but has not reached at this time. Original goals remain appropriate. OT reviewed and updated timeline for goal achievement. Pt able to complete seated level grooming and sequencing tasks with moderate cueing for problem solving. Additionally able to complete sit<>stand as a prerequisite for standing ADL participation with mod assist +2 with RW this session. OT will continue to follow acutely.   Follow Up Recommendations  SNF    Equipment Recommendations  None recommended by OT    Recommendations for Other Services      Precautions / Restrictions Precautions Precautions: Fall;Back Precaution Booklet Issued: No Precaution Comments: Pt able to state 3/3 back precautions.   Required Braces or Orthoses: Spinal Brace Spinal Brace: Lumbar corset;Applied in sitting position Restrictions Weight Bearing Restrictions: No       Mobility Bed Mobility               General bed mobility comments: Pt in recliner with PT on arrival.    Transfers Overall transfer level: Needs assistance Equipment used: Rolling walker (2 wheeled) Transfers: Sit to/from Stand Sit to Stand: Mod assist;+2 physical assistance         General transfer comment: Mod +2 with RW.  PTA blocked L knee during transfer. Max cues to continue with standing. Able to achieve full standing upright but only able to tolerate 5-8 seconds of standing.      Balance Overall balance assessment: Needs assistance Sitting-balance support:  Bilateral upper extremity supported;Feet supported Sitting balance-Leahy Scale: Fair Sitting balance - Comments: Able to sit Edge of chair unsupported but with intermittent use of BUE to correct lateral and posterior LOB.  Pt performed reaching tasks with emphasis on avoiding twisting motions.   Postural control: Posterior lean;Right lateral lean;Left lateral lean Standing balance support: During functional activity;Bilateral upper extremity supported Standing balance-Leahy Scale: Poor Standing balance comment: Heavy reliant on UEs, required blocking of LLE and assist to maintain hip extension and avoid posterior lean.                     ADL Overall ADL's : Needs assistance/impaired     Grooming: Oral care;Supervision/safety;Set up;Sitting;Wash/dry face;Brushing hair Grooming Details (indicate cue type and reason): VC's for safety and sequencing oral care and other grooming tasks.             Lower Body Dressing: Maximal assistance;Sit to/from stand;+2 for physical assistance Lower Body Dressing Details (indicate cue type and reason): Mod assist +2 for sit<>stand.   Toilet Transfer Details (indicate cue type and reason): Able to complete sit<>stand for standing ADL tasks with mod assist +2. Unable to attempt pivot this session.           General ADL Comments: Pt able to complete ADL tasks sitting at sink. Able to complete 3 step tasks with moderate verbal cueing.      Vision                     Quarry manager  Behavior During Therapy: Flat affect Overall Cognitive Status: Impaired/Different from baseline Area of Impairment: Memory;Following commands;Attention;Problem solving   Current Attention Level: Selective Memory: Decreased recall of precautions;Decreased short-term memory  Following Commands: Follows multi-step commands inconsistently     Problem Solving: Difficulty sequencing;Requires verbal cues;Requires tactile  cues General Comments: Pt remains to reports he does not know when or how he should wear his brace.        Exercises General Exercises - Lower Extremity Hip ABduction/ADduction: AROM;Both;10 reps;Seated Hip Flexion/Marching: AROM;Both;10 reps;Seated Heel Raises: AROM;Both;10 reps;Seated   Shoulder Instructions       General Comments      Pertinent Vitals/ Pain       Pain Assessment: 0-10 Pain Score: 5  Pain Location: Back Pain Descriptors / Indicators: Sore;Operative site guarding Pain Intervention(s): Monitored during session;Repositioned  Home Living                                          Prior Functioning/Environment              Frequency  Min 2X/week        Progress Toward Goals  OT Goals(current goals can now be found in the care plan section)  Progress towards OT goals: Progressing toward goals (Goal timeline met; goals remain appropriate; updated time)  Acute Rehab OT Goals Patient Stated Goal: unable to state OT Goal Formulation: With patient Time For Goal Achievement: 02/20/17 Potential to Achieve Goals: Good ADL Goals Pt Will Perform Grooming: with min guard assist;sitting Pt Will Perform Upper Body Dressing: with min guard assist;sitting Pt Will Perform Lower Body Dressing: sit to/from stand;with mod assist;with adaptive equipment Pt Will Transfer to Toilet: with mod assist;bedside commode;stand pivot transfer Additional ADL Goal #1: Pt will complete bed mobility supervision level as precursor to adls.  Additional ADL Goal #2: Pt will don doff brace mod I as precursor to adls.   Plan Discharge plan remains appropriate    Co-evaluation                 End of Session Equipment Utilized During Treatment: Back brace;Gait belt;Rolling walker  OT Visit Diagnosis: Muscle weakness (generalized) (M62.81)   Activity Tolerance Patient tolerated treatment well   Patient Left in chair;with call bell/phone within reach;with  chair alarm set   Nurse Communication Other (comment) (recommendation for compression stockings and LLE edema.)        Time: 6060-0459 (dovetailed treatment with PTA in order to address functional transfer prior to ADL session) OT Time Calculation (min): 17 min   Charges: OT General Charges $OT Visit: 1 Procedure OT Treatments $Self Care/Home Management : 8-22 mins  Norman Herrlich, MS OTR/L  Pager: Obert A Ladarrion Telfair 02/07/2017, 3:02 PM

## 2017-02-07 NOTE — Progress Notes (Signed)
PROGRESS NOTE    Joseph Hebert  RWE:[id number] DOB: 10-Dec-1946 DOA: 01/22/2017 PCP: [name], MD    Brief Narrative:  Joseph Hebert is a 71 y.o. gentleman with multiple medical issues including PVD (history of LLE bypass, bilateral common iliac stents), CAD, COPD, CKD 3, HTN, HLD, DM, CAD, and chronic back pain who was admitted to the neurosurgery service for ACDF of his lumbar spine.  The patient went to the OR on 2/6.  On 2/9, he was transferred to the ICU for evaluation of hypotension, hypoxia, and acute encephalopathy.  Chest xray was concerning for a LLL pneumonia (concern for aspiration but treated as HCAP).  He was started on vanc and zosyn.  On 2/10 he had a bradycardic arrest; per notes, CPR was performed for less than one minute before ROSC.  He was intubated.  There was no evidence of MI.  Echo on the chart, and cardiology followed immediately after the arrest.  The patient has also had evidence of AKI and he has required transfusion for post operative anemia.  Of note, he also had positive stool heme occult testing but apparently no overt signs of GI bleeding.  Hgb has been 7-7.5 for several days now.  He was extubated successfully on 2/17.  He is tolerating PO intake.  He is ready for transfer out of the ICU. Of note, it appears that cardiology, nephrology, and PCCM have signed off for now.  The patient received vanc from 2/9 - 2/14 The patient received zosyn from 2/9 - 2/12 He has been on Rocephin since 2/12  Sputum culture from 2/9 positive for Serratia Blood cultures from 2/9 NGTD   Assessment & Plan:    1-S/P ACDF lumbar spine Pain management.  Needs Rehab.  Management per neurosurgery.   2-HCAP, serratia PNA.  Remains on Rocephin; day 10/10. Stop 02-07-17 Extubated successfully 2/17 Improving.   Acute hypoxic respiratory failure: secondary to PNA and pulmonary edema.  Oxygen sat better on 2 L. Chest x ray: stable.  NSL fluids. Consider resuming home  lasix if sodium remain stable.   Acute encephalopathy/delirium: Recommend continuing to minimize sedatives and benzodiazepines. Continuing Neurontin. Improving.   Bradycardic arrest during this admission; cardiology thought this was related to vagotonia.  Avoid Beta-blocker.   SVT; Cardiology consulted. No further evaluation per Dr Acie Fredrickson. Mg normal.   AKI; Cr last year at 1.8--19 Cr peak to 4 during this admission.  Improving.   Hypernatremia;  Resolved with D 5.     Anemia with positive stool heme occult Monitor closely, CT abdomen post sx showed stable stranding in the peritoneum on the left extending inferiorly.  daily PPI Monitor for transfusion requirement (Hgb less than 7) Needs outpatient colonoscopy    HTN --continue with Hydralazine and Norvasc.  --Avoid Beta blocker due to bradycardiac arrest this admission.   Hypokalemia; repleted   DM; SSI.   CBG (last 3)   Recent Labs  02/06/17 2020 02/07/17 0612 02/07/17 1105  GLUCAP 99 93 100*      DVT prophylaxis: Heparin  Code Status: Full code.  Family Communication: none at bedside.  Disposition Plan: needs SNF  Consultants:   Neurosurgery is primary   Cardiology   CCM  Nephrology; sign off.    Procedures:  ECHO; Left ventricle: The cavity size was normal. Wall thickness was   increased in a pattern of mild LVH. Systolic function was   vigorous. The estimated ejection fraction was in the range of 65%   to  70%. Wall motion was normal; there were no regional wall   motion abnormalities. Doppler parameters are consistent with   abnormal left ventricular relaxation (grade 1 diastolic   dysfunction). - Right atrium: The atrium was mildly dilated. - Pulmonary arteries: Systolic pressure was moderately increased.   PA peak pressure: 54 mm Hg (S).     Antimicrobials: The patient received vanc from 2/9 - 2/14 The patient received zosyn from 2/9 - 2/12 He has been on Rocephin since  2/12    Subjective: Patient is alert, oriented to person , place. He denies difficulty swallowing. He is eating more. He doesn't like hospital food.  He denies dyspnea.   Objective: Vitals:   02/06/17 2211 02/07/17 0117 02/07/17 0500 02/07/17 1007  BP: (!) 146/57 (!) 143/59 (!) 145/64 123/86  Pulse: 69 71 79 81  Resp: 20 20 20 20   Temp: 98.4 F (36.9 C) 98.2 F (36.8 C) 98.4 F (36.9 C) 98.4 F (36.9 C)  TempSrc: Oral Oral Oral Oral  SpO2: 99% 99% 97% 95%  Weight:   107.1 kg (236 lb 1.8 oz)   Height:        Intake/Output Summary (Last 24 hours) at 02/07/17 1305 Last data filed at 02/07/17 0900  Gross per 24 hour  Intake              360 ml  Output             1500 ml  Net            -1140 ml   Filed Weights   02/05/17 0500 02/06/17 0500 02/07/17 0500  Weight: 105.6 kg (232 lb 12.9 oz) 108.1 kg (238 lb 5.1 oz) 107.1 kg (236 lb 1.8 oz)    Examination:  General exam: Appears calm and comfortable  Respiratory system: Clear to auscultation. Respiratory effort normal. Cardiovascular system: S1 & S2 heard, RRR. No JVD, murmurs, rubs, gallops or clicks. No pedal edema. Gastrointestinal system: Abdomen is nondistended, soft and nontender. No organomegaly or masses felt. Normal bowel sounds heard. Incision left lower quadrant, healing  Central nervous system: Alert and oriented. No focal neurological deficits. Extremities: Symmetric 5 x 5 power. Skin: No rashes, lesions or ulcers Psychiatry: Judgement and insight appear normal. Mood & affect appropriate.     Data Reviewed: I have personally reviewed following labs and imaging studies  CBC:  Recent Labs Lab 02/03/17 0547 02/04/17 0410 02/05/17 0313 02/06/17 0333 02/07/17 0227  WBC 13.1* 10.8* 10.4 11.3* 9.4  HGB 7.9* 7.6* 8.0* 8.0* 7.9*  HCT 25.6* 25.2* 26.7* 26.2* 25.1*  MCV 95.2 95.1 94.3 93.2 92.3  PLT 672* 709* 742* 712* 573*   Basic Metabolic Panel:  Recent Labs Lab 02/01/17 0603 02/02/17 0530  02/03/17 0547 02/04/17 0410 02/05/17 0313 02/05/17 1027 02/06/17 0333 02/07/17 0227  NA 149* 154* 152* 154* 150*  --  140 142  K 3.4* 3.6 3.6 3.6 3.5 3.7 3.2* 3.6  CL 118* 123* 119* 120* 116*  --  108 108  CO2 22 23 24 25 26   --  23 23  GLUCOSE 119* 129* 133* 154* 130*  --  115* 105*  BUN 108* 105* 71* 49* 37*  --  24* 18  CREATININE 2.85* 2.45* 1.98* 1.75* 1.81*  --  1.60* 1.45*  CALCIUM 9.1 9.4 9.2 8.7* 8.7*  --  8.4* 8.6*  MG 2.5* 2.7* 2.4 2.3  --  2.1  --   --   PHOS 3.4 2.6 2.3* 3.2  --   --   --   --  GFR: Estimated Creatinine Clearance: 56.8 mL/min (by C-G formula based on SCr of 1.45 mg/dL (H)). Liver Function Tests:  Recent Labs Lab 02/05/17 0313  AST 34  ALT 25  ALKPHOS 119  BILITOT 0.5  PROT 6.3*  ALBUMIN 2.3*   No results for input(s): LIPASE, AMYLASE in the last 168 hours. No results for input(s): AMMONIA in the last 168 hours. Coagulation Profile: No results for input(s): INR, PROTIME in the last 168 hours. Cardiac Enzymes: No results for input(s): CKTOTAL, CKMB, CKMBINDEX, TROPONINI in the last 168 hours. BNP (last 3 results) No results for input(s): PROBNP in the last 8760 hours. HbA1C: No results for input(s): HGBA1C in the last 72 hours. CBG:  Recent Labs Lab 02/06/17 1122 02/06/17 1611 02/06/17 2020 02/07/17 0612 02/07/17 1105  GLUCAP 107* 109* 99 93 100*   Lipid Profile: No results for input(s): CHOL, HDL, LDLCALC, TRIG, CHOLHDL, LDLDIRECT in the last 72 hours. Thyroid Function Tests: No results for input(s): TSH, T4TOTAL, FREET4, T3FREE, THYROIDAB in the last 72 hours. Anemia Panel: No results for input(s): VITAMINB12, FOLATE, FERRITIN, TIBC, IRON, RETICCTPCT in the last 72 hours. Sepsis Labs: No results for input(s): PROCALCITON, LATICACIDVEN in the last 168 hours.  No results found for this or any previous visit (from the past 240 hour(s)).       Radiology Studies: No results found.      Scheduled Meds: .  acetaminophen  650 mg Oral Q6H  . amLODipine  5 mg Oral Daily  . aspirin  81 mg Oral Daily  . atorvastatin  80 mg Oral QHS  . cefTRIAXone (ROCEPHIN)  IV  2 g Intravenous Q24H  . diphenhydrAMINE-zinc acetate   Topical Q8H  . docusate sodium  100 mg Oral BID  . feeding supplement (ENSURE ENLIVE)  237 mL Oral BID BM  . fluticasone  2 spray Each Nare Daily  . gabapentin  300 mg Oral TID  . heparin subcutaneous  5,000 Units Subcutaneous Q8H  . hydrALAZINE  25 mg Oral BID  . insulin aspart  0-9 Units Subcutaneous TID WC  . loratadine  10 mg Oral Daily  . nystatin   Topical TID  . pantoprazole  40 mg Oral Q1200  . polyethylene glycol  17 g Oral BID   Continuous Infusions: . sodium chloride 10 mL/hr at 01/26/17 2000  . sodium chloride       LOS: 16 days    Time spent: 35 minutes.     Thurnell Lose, MD [facility] Pager [phone]  If 7PM-7AM, please contact night-coverage [email or web address] Password [email or web address] 02/07/2017, 1:05 PM

## 2017-02-07 NOTE — Progress Notes (Signed)
Physical Therapy Treatment Patient Details Name: Joseph Hebert MRN: [id number] DOB: 06/26/1946 Today's Date: 02/07/2017    History of Present Illness 71 yo male s/p L 3-4 L4-5 ALIF L 3-s1 pedicule screw fixation with robotic assistance 2/9 hypotensive and hypoxic and transferred to ICU. Intubated 2/10-2/17 for respiratory decompensation. Developed Serratia PNA and being treated with ceftriaxone.     PT Comments    Pt performed sit to stand with RW with +2 mod assist.  [name] 1st attempt to stand with out stedy frame.  Will continue to focus on core strength, standing tolerance in hopes to progress to gait training.     Follow Up Recommendations  SNF     Equipment Recommendations  Wheelchair (measurements PT);Wheelchair cushion (measurements PT);Hospital bed;Other (comment)    Recommendations for Other Services OT consult     Precautions / Restrictions Precautions Precautions: Fall;Back Precaution Booklet Issued: No Precaution Comments: Pt able to state 3/3 back precautions.   Required Braces or Orthoses: Spinal Brace Spinal Brace: Lumbar corset;Applied in sitting position (re-educated on placement and correct wear.  ) Restrictions Weight Bearing Restrictions: No    Mobility  Bed Mobility               General bed mobility comments: Pt in recliner on arrival.    Transfers Overall transfer level: Needs assistance Equipment used: Rolling walker (2 wheeled) Transfers: Sit to/from Stand Sit to Stand: Mod assist;+2 physical assistance         General transfer comment: Mod +2 with RW.  PTA blocked L knee during transfer.  Able to achieve full standing upright but only able to tolerate 5-8 seconds of standing.    Ambulation/Gait Ambulation/Gait assistance:  (Patient remains unable due to weakness.  )           General Gait Details: unable   Stairs            Wheelchair Mobility    Modified Rankin (Stroke Patients Only)       Balance Overall  balance assessment: Needs assistance   Sitting balance-Leahy Scale: Fair Sitting balance - Comments: Able to sit Edge of chair unsupported but with intermittent use of BUE to correct lateral and posterior LOB.  Pt performed reaching tasks with emphasis on avoiding twisting motions.   Postural control: Posterior lean;Right lateral lean;Left lateral lean Standing balance support: During functional activity;Bilateral upper extremity supported Standing balance-Leahy Scale: Poor Standing balance comment: Heavy reliant on UEs, required blocking of LLE and assist to maintain hip extension and avoid posterior lean.                      Cognition Arousal/Alertness: Awake/alert Behavior During Therapy: Flat affect Overall Cognitive Status: Impaired/Different from baseline Area of Impairment: Memory;Following commands;Attention   Current Attention Level: Selective Memory: Decreased recall of precautions;Decreased short-term memory Following Commands: Follows multi-step commands inconsistently     Problem Solving: Difficulty sequencing;Requires verbal cues;Requires tactile cues General Comments: Pt remains to reports he does not know when or how he should wear his brace.      Exercises General Exercises - Lower Extremity Hip ABduction/ADduction: AROM;Both;10 reps;Seated Hip Flexion/Marching: AROM;Both;10 reps;Seated Heel Raises: AROM;Both;10 reps;Seated    General Comments        Pertinent Vitals/Pain Pain Assessment: Faces Pain Score: 5  Pain Location: Back Pain Descriptors / Indicators: Sore;Operative site guarding Pain Intervention(s): Monitored during session;Repositioned    Home Living  Prior Function            PT Goals (current goals can now be found in the care plan section) Acute Rehab PT Goals Patient Stated Goal: unable to state Potential to Achieve Goals: Fair Progress towards PT goals: Progressing toward goals     Frequency    Min 5X/week      PT Plan Current plan remains appropriate    Co-evaluation             End of Session Equipment Utilized During Treatment: Gait belt;Back brace Activity Tolerance: Patient tolerated treatment well Patient left: with call bell/phone within reach;with bed alarm set;in chair;with chair alarm set Nurse Communication: Mobility status;Need for lift equipment (informed nursing to use stedy for back  to bed.  ) PT Visit Diagnosis: Unsteadiness on feet (R26.81);Muscle weakness (generalized) (M62.81)     Time: 1105 (OT enterted at 11:24 to dove tail treatment and to assist with functional transfer.  )-1132 PT Time Calculation (min) (ACUTE ONLY): 27 min  Charges:  $Therapeutic Activity: 8-22 mins                    G Codes:       Joseph Hebert 02-23-17, 11:48 AM Joseph Hebert, PTA pager [phone]

## 2017-02-07 NOTE — Care Management Important Message (Signed)
Important Message  Patient Details  Name: Joseph Hebert MRN: [id number] Date of Birth: 1946-03-01   Medicare Important Message Given:  Yes    Orbie Pyo 02/07/2017, 11:25 AM

## 2017-02-07 NOTE — Care Management Note (Signed)
Case Management Note  Patient Details  Name: Joseph Hebert MRN: [id number] Date of Birth: 1946-11-28  Subjective/Objective:                    Action/Plan: Patient transferred from 323M to 23M yesterday. CSW following for SNF placement when he is medically stable. CM following for further d/c needs.  Expected Discharge Date:  01/27/17               Expected Discharge Plan:  Skilled Nursing Facility  In-House Referral:  Clinical Social Work  Discharge planning Services  CM Consult  Post Acute Care Choice:    Choice offered to:     DME Arranged:    DME Agency:     HH Arranged:    [place] Agency:     Status of Service:  In process, will continue to follow  If discussed at Long Length of Stay Meetings, dates discussed:    Additional Comments:  [name], RN 02/07/2017, 12:38 PM

## 2017-02-07 NOTE — Progress Notes (Addendum)
Pt seen and examined.  No issues overnight. Eating and drinking well. Normal output. Not complaining of anything.   EXAM: Temp:  [98 F (36.7 C)-98.4 F (36.9 C)] 98.4 F (36.9 C) (02/22 1007) Pulse Rate:  [69-81] 81 (02/22 1007) Resp:  [18-20] 20 (02/22 1007) BP: (123-169)/(55-86) 123/86 (02/22 1007) SpO2:  [95 %-99 %] 95 % (02/22 1007) Weight:  [107.1 kg (236 lb 1.8 oz)] 107.1 kg (236 lb 1.8 oz) (02/22 0500) Intake/Output      02/21 0701 - 02/22 0700 02/22 0701 - 02/23 0700   P.O. 240 360   I.V. (mL/kg) 39.2 (0.4)    IV Piggyback     Total Intake(mL/kg) 279.2 (2.6) 360 (3.4)   Urine (mL/kg/hr) 1860 (0.7)    Stool 0 (0)    Total Output 1860     Net -1580.8 +360        Urine Occurrence 2 x    Stool Occurrence 3 x     Awake and alert Follows commands throughout Full strength  Stable On last day of antibiotics for HAP Will most likely discharge tomorrow.

## 2017-02-08 LAB — BASIC METABOLIC PANEL
Anion gap: 11 (ref 5–15)
BUN: 16 mg/dL (ref 6–20)
CHLORIDE: 108 mmol/L (ref 101–111)
CO2: 22 mmol/L (ref 22–32)
CREATININE: 1.56 mg/dL — AB (ref 0.61–1.24)
Calcium: 8.6 mg/dL — ABNORMAL LOW (ref 8.9–10.3)
GFR calc non Af Amer: 43 mL/min — ABNORMAL LOW (ref >60)
GFR, EST AFRICAN AMERICAN: 50 mL/min — AB (ref >60)
Glucose, Bld: 96 mg/dL (ref 65–99)
Potassium: 4.4 mmol/L (ref 3.5–5.1)
Sodium: 141 mmol/L (ref 135–145)

## 2017-02-08 LAB — GLUCOSE, CAPILLARY
GLUCOSE-CAPILLARY: 93 mg/dL (ref 65–99)
GLUCOSE-CAPILLARY: 115 mg/dL — AB (ref 65–99)

## 2017-02-08 LAB — MAGNESIUM: MAGNESIUM: 1.9 mg/dL (ref 1.7–2.4)

## 2017-02-08 MED ORDER — POTASSIUM CHLORIDE CRYS ER 20 MEQ PO TBCR
40.0000 meq | EXTENDED_RELEASE_TABLET | Freq: Once | ORAL | Status: DC
  Filled 2017-02-08: qty 2

## 2017-02-08 MED ORDER — POTASSIUM CHLORIDE CRYS ER 20 MEQ PO TBCR: 20.0000 meq | EXTENDED_RELEASE_TABLET | Freq: Every day | ORAL | 0 refills | Status: DC

## 2017-02-08 MED ORDER — GABAPENTIN 300 MG PO CAPS: 300.0000 mg | ORAL_CAPSULE | Freq: Three times a day (TID) | ORAL | 2 refills | Status: DC

## 2017-02-08 MED ORDER — OXYCODONE-ACETAMINOPHEN 7.5-325 MG PO TABS: 1.0000 | ORAL_TABLET | ORAL | 0 refills | Status: DC | PRN

## 2017-02-08 NOTE — Progress Notes (Addendum)
PROGRESS NOTE    Joseph Hebert  GYF:[id number] DOB: Apr 23, 1946 DOA: 01/22/2017 PCP: [name], MD    Brief Narrative:  Joseph Hebert is a 71 y.o. gentleman with multiple medical issues including PVD (history of LLE bypass, bilateral common iliac stents), CAD, COPD, CKD 3, HTN, HLD, DM, CAD, and chronic back pain who was admitted to the neurosurgery service for ACDF of his lumbar spine.  The patient went to the OR on 2/6.  On 2/9, he was transferred to the ICU for evaluation of hypotension, hypoxia, and acute encephalopathy.  Chest xray was concerning for a LLL pneumonia (concern for aspiration but treated as HCAP).  He was started on vanc and zosyn.  On 2/10 he had a bradycardic arrest; per notes, CPR was performed for less than one minute before ROSC.  He was intubated.  There was no evidence of MI.  Echo on the chart, and cardiology followed immediately after the arrest.  The patient has also had evidence of AKI and he has required transfusion for post operative anemia.  Of note, he also had positive stool heme occult testing but apparently no overt signs of GI bleeding.  Hgb has been 7-7.5 for several days now.  He was extubated successfully on 2/17.  He is tolerating PO intake.  He is ready for transfer out of the ICU. Of note, it appears that cardiology, nephrology, and PCCM have signed off for now.  The patient received vanc from 2/9 - 2/14 The patient received zosyn from 2/9 - 2/12 He has been on Rocephin since 2/12  Sputum culture from 2/9 positive for Serratia Blood cultures from 2/9 NGTD   Assessment & Plan:    1-S/P ACDF lumbar spine Pain management.  Needs Rehab.  Management per neurosurgery. Patient has been discharged this morning by neurosurgery to SNF.  2-HCAP, serratia PNA.  Required intubation and was extubated 02/02/2017, has finished his total antibiotic course. Close to baseline.   Acute hypoxic respiratory failure: secondary to PNA and pulmonary edema.   Oxygen sat better on 2 L. Chest x ray: stable.  Home dose Lasix has been resumed.   Acute encephalopathy/delirium: Recommend continuing to minimize sedatives and benzodiazepines. Continuing Neurontin. Improving.   Bradycardic arrest during this admission; cardiology thought this was related to vagotonia.  Avoid Beta-blocker.   SVT; Cardiology consulted. No further evaluation per Dr Acie Fredrickson. Mg normal.   AKI; on CK D4, Cr last year at 1.8--19 Resolved.   Hypernatremia;  Resolved with D 5.     Anemia with positive stool heme occult Monitor closely, CT abdomen post sx showed stable stranding in the peritoneum on the left extending inferiorly.  daily PPI Monitor for transfusion requirement (Hgb less than 7) Needs outpatient colonoscopy    HTN --continue with Hydralazine and Norvasc.  --Avoid Beta blocker due to bradycardiac arrest this admission.   Hypokalemia; repleted   DM; SSI.   CBG (last 3)   Recent Labs  02/07/17 1617 02/07/17 2145 02/08/17 0632  GLUCAP 89 117* 93      DVT prophylaxis: Heparin  Code Status: Full code.  Family Communication: none at bedside.  Disposition Plan: needs SNF  Consultants:   Neurosurgery is primary   Cardiology   CCM  Nephrology; sign off.    Procedures:  ECHO; Left ventricle: The cavity size was normal. Wall thickness was   increased in a pattern of mild LVH. Systolic function was   vigorous. The estimated ejection fraction was in the range of  65%   to 70%. Wall motion was normal; there were no regional wall   motion abnormalities. Doppler parameters are consistent with   abnormal left ventricular relaxation (grade 1 diastolic   dysfunction). - Right atrium: The atrium was mildly dilated. - Pulmonary arteries: Systolic pressure was moderately increased.   PA peak pressure: 54 mm Hg (S).     Antimicrobials: The patient received vanc from 2/9 - 2/14 The patient received zosyn from 2/9 - 2/12 He has been  on Rocephin since 2/12    Subjective: Patient is alert, oriented to person , place. He denies difficulty swallowing. He is eating more. He doesn't like hospital food.  He denies dyspnea.   Objective: Vitals:   02/07/17 2152 02/08/17 0119 02/08/17 0504 02/08/17 1018  BP: (!) 148/48 (!) 160/52 (!) 168/67 (!) 171/69  Pulse: 76 78 78 77  Resp: 20 20 20 20   Temp: 98.5 F (36.9 C) 98.2 F (36.8 C) 98.2 F (36.8 C) 98.3 F (36.8 C)  TempSrc: Oral Oral Oral Oral  SpO2: 96% 95% 96% 95%  Weight:      Height:        Intake/Output Summary (Last 24 hours) at 02/08/17 1021 Last data filed at 02/08/17 0120  Gross per 24 hour  Intake              600 ml  Output             1600 ml  Net            -1000 ml   Filed Weights   02/05/17 0500 02/06/17 0500 02/07/17 0500  Weight: 105.6 kg (232 lb 12.9 oz) 108.1 kg (238 lb 5.1 oz) 107.1 kg (236 lb 1.8 oz)    Examination:  General exam: Appears calm and comfortable  Respiratory system: Clear to auscultation. Respiratory effort normal. Cardiovascular system: S1 & S2 heard, RRR. No JVD, murmurs, rubs, gallops or clicks. No pedal edema. Gastrointestinal system: Abdomen is nondistended, soft and nontender. No organomegaly or masses felt. Normal bowel sounds heard. Incision left lower quadrant, healing  Central nervous system: Alert and oriented. No focal neurological deficits. Extremities: Symmetric 5 x 5 power. Skin: No rashes, lesions or ulcers Psychiatry: Judgement and insight appear normal. Mood & affect appropriate.     Data Reviewed: I have personally reviewed following labs and imaging studies  CBC:  Recent Labs Lab 02/03/17 0547 02/04/17 0410 02/05/17 0313 02/06/17 0333 02/07/17 0227  WBC 13.1* 10.8* 10.4 11.3* 9.4  HGB 7.9* 7.6* 8.0* 8.0* 7.9*  HCT 25.6* 25.2* 26.7* 26.2* 25.1*  MCV 95.2 95.1 94.3 93.2 92.3  PLT 672* 709* 742* 712* 595*   Basic Metabolic Panel:  Recent Labs Lab 02/02/17 0530 02/03/17 0547  02/04/17 0410 02/05/17 0313 02/05/17 1027 02/06/17 0333 02/07/17 0227 02/08/17 0738  NA 154* 152* 154* 150*  --  140 142 141  K 3.6 3.6 3.6 3.5 3.7 3.2* 3.6 4.4  CL 123* 119* 120* 116*  --  108 108 108  CO2 23 24 25 26   --  23 23 22   GLUCOSE 129* 133* 154* 130*  --  115* 105* 96  BUN 105* 71* 49* 37*  --  24* 18 16  CREATININE 2.45* 1.98* 1.75* 1.81*  --  1.60* 1.45* 1.56*  CALCIUM 9.4 9.2 8.7* 8.7*  --  8.4* 8.6* 8.6*  MG 2.7* 2.4 2.3  --  2.1  --   --  1.9  PHOS 2.6 2.3* 3.2  --   --   --   --   --  GFR: Estimated Creatinine Clearance: 52.8 mL/min (by C-G formula based on SCr of 1.56 mg/dL (H)). Liver Function Tests:  Recent Labs Lab 02/05/17 0313  AST 34  ALT 25  ALKPHOS 119  BILITOT 0.5  PROT 6.3*  ALBUMIN 2.3*   No results for input(s): LIPASE, AMYLASE in the last 168 hours. No results for input(s): AMMONIA in the last 168 hours. Coagulation Profile: No results for input(s): INR, PROTIME in the last 168 hours. Cardiac Enzymes: No results for input(s): CKTOTAL, CKMB, CKMBINDEX, TROPONINI in the last 168 hours. BNP (last 3 results) No results for input(s): PROBNP in the last 8760 hours. HbA1C: No results for input(s): HGBA1C in the last 72 hours. CBG:  Recent Labs Lab 02/07/17 0612 02/07/17 1105 02/07/17 1617 02/07/17 2145 02/08/17 0632  GLUCAP 93 100* 89 117* 93   Lipid Profile: No results for input(s): CHOL, HDL, LDLCALC, TRIG, CHOLHDL, LDLDIRECT in the last 72 hours. Thyroid Function Tests: No results for input(s): TSH, T4TOTAL, FREET4, T3FREE, THYROIDAB in the last 72 hours. Anemia Panel: No results for input(s): VITAMINB12, FOLATE, FERRITIN, TIBC, IRON, RETICCTPCT in the last 72 hours. Sepsis Labs: No results for input(s): PROCALCITON, LATICACIDVEN in the last 168 hours.  No results found for this or any previous visit (from the past 240 hour(s)).   Radiology Studies: No results found.  Scheduled Meds: . acetaminophen  650 mg Oral Q6H  .  amLODipine  5 mg Oral Daily  . aspirin  81 mg Oral Daily  . atorvastatin  80 mg Oral QHS  . diphenhydrAMINE-zinc acetate   Topical Q8H  . docusate sodium  100 mg Oral BID  . feeding supplement (ENSURE ENLIVE)  237 mL Oral BID BM  . fluticasone  2 spray Each Nare Daily  . furosemide  40 mg Oral BID  . gabapentin  300 mg Oral TID  . heparin subcutaneous  5,000 Units Subcutaneous Q8H  . hydrALAZINE  25 mg Oral BID  . insulin aspart  0-9 Units Subcutaneous TID WC  . loratadine  10 mg Oral Daily  . nystatin   Topical TID  . pantoprazole  40 mg Oral Q1200  . polyethylene glycol  17 g Oral BID  . potassium chloride  40 mEq Oral Once   Continuous Infusions:    LOS: 17 days    Time spent: 35 minutes.     Thurnell Lose, MD [facility] Pager [phone]  If 7PM-7AM, please contact night-coverage [email or web address] Password [facility] 02/08/2017, 10:21 AM

## 2017-02-08 NOTE — Progress Notes (Signed)
Patient will discharge to [facility] Anticipated discharge date: 2/23 Family notified: [name] by [employer]- scheduled for 11:30am Report #: [id number] [phone] ([place])  CSW signing off.  Jorge Ny, LCSW Clinical Social Worker [phone]

## 2017-02-08 NOTE — Progress Notes (Addendum)
Report was called in to [facility] Gaynelle Cage)  SNF  to [facility]. Family updated.

## 2017-02-08 NOTE — Care Management Note (Signed)
Case Management Note  Patient Details  Name: Joseph Hebert MRN: [id number] Date of Birth: [birth year]  Subjective/Objective:                    Action/Plan: Pt discharging to SNF today. No further needs per CM.   Expected Discharge Date:  02/08/17               Expected Discharge Plan:  Skilled Nursing Facility  In-House Referral:  Clinical Social Work  Discharge planning Services  CM Consult  Post Acute Care Choice:    Choice offered to:     DME Arranged:    DME Agency:     HH Arranged:    [place] Agency:     Status of Service:  Completed, signed off  If discussed at [employer], dates discussed:    Additional Comments:  [name], RN 02/08/2017, 10:21 AM

## 2017-02-08 NOTE — Discharge Summary (Signed)
Date of Admission: 01/22/2017  Date of Discharge: 02/08/17  PRE-OPERATIVE DIAGNOSIS:  Other spondylosis with radiculopathy, Lumbosacral  POST-OPERATIVE DIAGNOSIS:  Other spondylosis with radiculopathy, Lumbosacral  PROCEDURE:  Procedure(s) with comments: LUMBAR FIVE-SACRUM ONE ANTERIOR LUMBAR INTERBODY FUSION WITH ABDOMINAL APPROACH BY DR CAIN (N/A) LUMBAR THREE-FOUR, LUMBAR FOUR-FIVE ANTERIOR LATERAL INTERBODY FUSION (N/A) - L3-4 L4-5 Lateral interbody fusion LUMBAR THREE-SACRAL ONE PERCUTANEOUS PEDICLE SCREW FIXATION WITH ROBOTIC ASSISTANCE (N/A) - L3 to S1 Percutaneous pedicle screw fixation APPLICATION OF ROBOTIC ASSISTANCE FOR SPINAL PROCEDURE (N/A) ABDOMINAL EXPOSURE (N/A) Attending: [name], MD  Hospital Course:  The patient was admitted for the above listed operation. His post-operative course was complicated by sepsis secondary to HAP and AKI requiring ICU admission on 01/25/2017. On 01/26/2017 he had bradycardic arrest and per notes CPR was performed for <1 min before ROSC. He was intubated and underwent cardiac work up. Cardiac work up without evidence of Mi. Advised to avoid Beta blocker due to bradycardiac arrest during admission. He was extubated 2/17 and able to tolerate po. He has been doing well since. Tolerating po. No complains of pain. Up ambulating with assistance. Completed 10 days of antibiotics. Will let hospitalist determine if outpt abx necessary at this time.  Of note, he also has had heme positive stool without signs of GI bleeding. He will need outpt colonoscopy for management.  Appreciative of assistance from cards, nephrology, PCCM and hospitalists.   Follow up: 2 weeks with Dr Cyndy Freeze Follow up: 1 week with PCP for med rec and anemia. Will need outpt colonoscopy for anemia and heme positive stool  Allergies as of 02/08/2017      Reactions   Lopressor [metoprolol Tartrate]    Severe bradycardia   No Known Allergies       Medication List    STOP  taking these medications   metoprolol succinate 50 MG 24 hr tablet Commonly known as:  TOPROL-XL     TAKE these medications   amLODipine 10 MG tablet Commonly known as:  NORVASC Take 10 mg by mouth daily.   aspirin EC 81 MG tablet Take 81 mg by mouth daily.   atorvastatin 80 MG tablet Commonly known as:  LIPITOR Take 80 mg by mouth at bedtime.   fenofibrate 145 MG tablet Commonly known as:  TRICOR Take 145 mg by mouth at bedtime.   furosemide 40 MG tablet Commonly known as:  LASIX Take 40 mg by mouth daily.   gabapentin 300 MG capsule Commonly known as:  NEURONTIN Take 1 capsule (300 mg total) by mouth 3 (three) times daily.   hydrALAZINE 25 MG tablet Commonly known as:  APRESOLINE Take 25 mg by mouth 2 (two) times daily.   lisinopril 40 MG tablet Commonly known as:  PRINIVIL,ZESTRIL Take 40 mg by mouth daily.   NAMENDA XR 28 MG Cp24 24 hr capsule Generic drug:  memantine Take 28 mg by mouth daily.   oxyCODONE-acetaminophen 7.5-325 MG tablet Commonly known as:  PERCOCET Take 1 tablet by mouth every 4 (four) hours as needed for severe pain.   ULORIC 80 MG Tabs Generic drug:  Febuxostat Take 80 mg by mouth daily.

## 2017-02-08 NOTE — Clinical Social Work Placement (Signed)
   CLINICAL SOCIAL WORK PLACEMENT  NOTE  Date:  02/08/2017  Patient Details  Name: Joseph Hebert MRN: [id number] Date of Birth: 1946-09-28  Clinical Social Work is seeking post-discharge placement for this patient at the [place] level of care (*CSW will initial, date and re-position this form in  chart as items are completed):  Yes   Patient/family provided with [place] Work Department's list of facilities offering this level of care within the geographic area requested by the patient (or if unable, by the patient's family).  Yes   Patient/family informed of their freedom to choose among providers that offer the needed level of care, that participate in Medicare, Medicaid or managed care program needed by the patient, have an available bed and are willing to accept the patient.  Yes   Patient/family informed of [place] [facility]'s ownership interest in [facility] and [facility], as well as of the fact that they are under no obligation to receive care at these facilities.  PASRR submitted to EDS on 01/28/17     PASRR number received on 01/28/17     Existing PASRR number confirmed on       FL2 transmitted to all facilities in geographic area requested by pt/family on 01/28/17     FL2 transmitted to all facilities within larger geographic area on       Patient informed that his/her managed care company has contracts with or will negotiate with certain facilities, including the following:        Yes   Patient/family informed of bed offers received.  Patient chooses bed at [place], [facility]     Physician recommends and patient chooses bed at      Patient to be transferred to [place] on 02/08/17.  Patient to be transferred to facility by ptar     Patient family notified on 02/08/17 of transfer.  Name of family member notified:  [name]     PHYSICIAN Please sign FL2     Additional Comment:     _______________________________________________ Jorge Ny, LCSW 02/08/2017, 10:39 AM

## 2017-03-21 DIAGNOSIS — R195 Other fecal abnormalities: Secondary | ICD-10-CM | POA: Diagnosis not present

## 2017-03-21 DIAGNOSIS — M4727 Other spondylosis with radiculopathy, lumbosacral region: Secondary | ICD-10-CM | POA: Diagnosis not present

## 2017-03-21 DIAGNOSIS — J9601 Acute respiratory failure with hypoxia: Secondary | ICD-10-CM | POA: Diagnosis not present

## 2017-03-21 DIAGNOSIS — I129 Hypertensive chronic kidney disease with stage 1 through stage 4 chronic kidney disease, or unspecified chronic kidney disease: Secondary | ICD-10-CM | POA: Diagnosis not present

## 2017-03-21 DIAGNOSIS — N179 Acute kidney failure, unspecified: Secondary | ICD-10-CM | POA: Diagnosis not present

## 2017-03-21 DIAGNOSIS — E1122 Type 2 diabetes mellitus with diabetic chronic kidney disease: Secondary | ICD-10-CM | POA: Diagnosis not present

## 2017-03-21 DIAGNOSIS — I469 Cardiac arrest, cause unspecified: Secondary | ICD-10-CM | POA: Diagnosis not present

## 2017-03-21 DIAGNOSIS — D62 Acute posthemorrhagic anemia: Secondary | ICD-10-CM | POA: Diagnosis not present

## 2017-03-21 DIAGNOSIS — Z6830 Body mass index (BMI) 30.0-30.9, adult: Secondary | ICD-10-CM | POA: Diagnosis not present

## 2017-03-21 DIAGNOSIS — J189 Pneumonia, unspecified organism: Secondary | ICD-10-CM | POA: Diagnosis not present

## 2017-03-21 DIAGNOSIS — K5900 Constipation, unspecified: Secondary | ICD-10-CM | POA: Diagnosis not present

## 2017-03-21 DIAGNOSIS — G4700 Insomnia, unspecified: Secondary | ICD-10-CM | POA: Diagnosis not present

## 2017-03-29 ENCOUNTER — Encounter: Payer: Self-pay | Admitting: Cardiology

## 2017-09-26 ENCOUNTER — Ambulatory Visit: Payer: Medicare Other | Admitting: Vascular Surgery

## 2017-09-26 ENCOUNTER — Encounter (HOSPITAL_COMMUNITY): Payer: Medicare Other

## 2017-09-26 ENCOUNTER — Inpatient Hospital Stay (HOSPITAL_COMMUNITY)
Admission: RE | Admit: 2017-09-26 | Discharge: 2017-09-26 | Disposition: A | Discharge status: Home / Self Care | Payer: Medicare Other | Source: Ambulatory Visit

## 2017-11-11 IMAGING — DX DG CHEST 1V PORT
1 series · 1 of 1 positions shown · non-contrast
Comparison: January 30, 2017

CLINICAL DATA: Hypoxia

EXAM:
PORTABLE CHEST 1 VIEW

[chest ap]
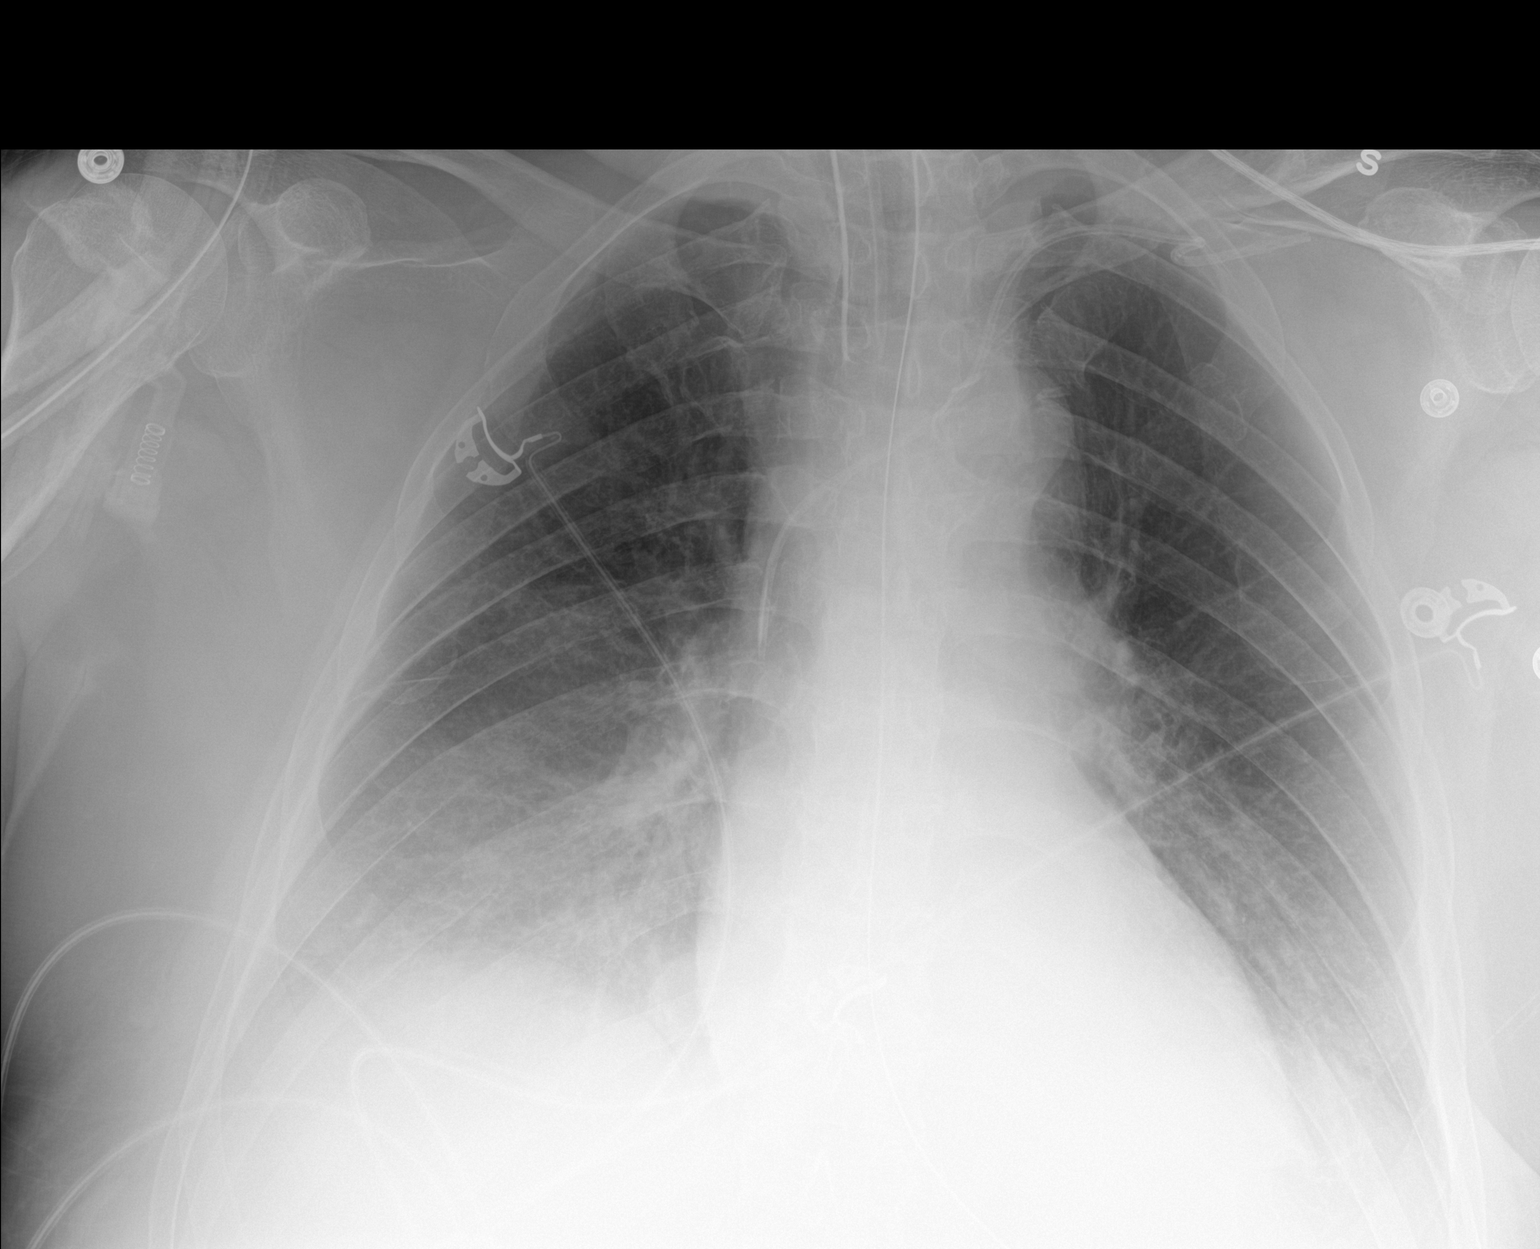

[1 of 1 positions shown; findings below may reference images not displayed]

FINDINGS: Endotracheal tube tip is 5.6 cm above the carina. Central catheter
tip is in the superior vena cava. Nasogastric tube tip and side port
below the diaphragm. No pneumothorax. There are small pleural
effusions bilaterally with bibasilar atelectasis. Heart is upper
normal in size with pulmonary vascularity within normal limits. No
adenopathy. There is an old healed fracture of the left clavicle.
IMPRESSION: Tube and catheter positions as described without pneumothorax. Small
pleural effusions bilaterally. Question residua of recent congestive
heart failure. No edema currently present. There is bibasilar
atelectatic change. Heart upper normal in size.

## 2017-11-12 IMAGING — DX DG CHEST 1V PORT
1 series · 1 of 1 positions shown · non-contrast
Comparison: 01/31/2017.  01/30/2017 .

CLINICAL DATA: Intubation.

EXAM:
PORTABLE CHEST 1 VIEW

[chest ap]
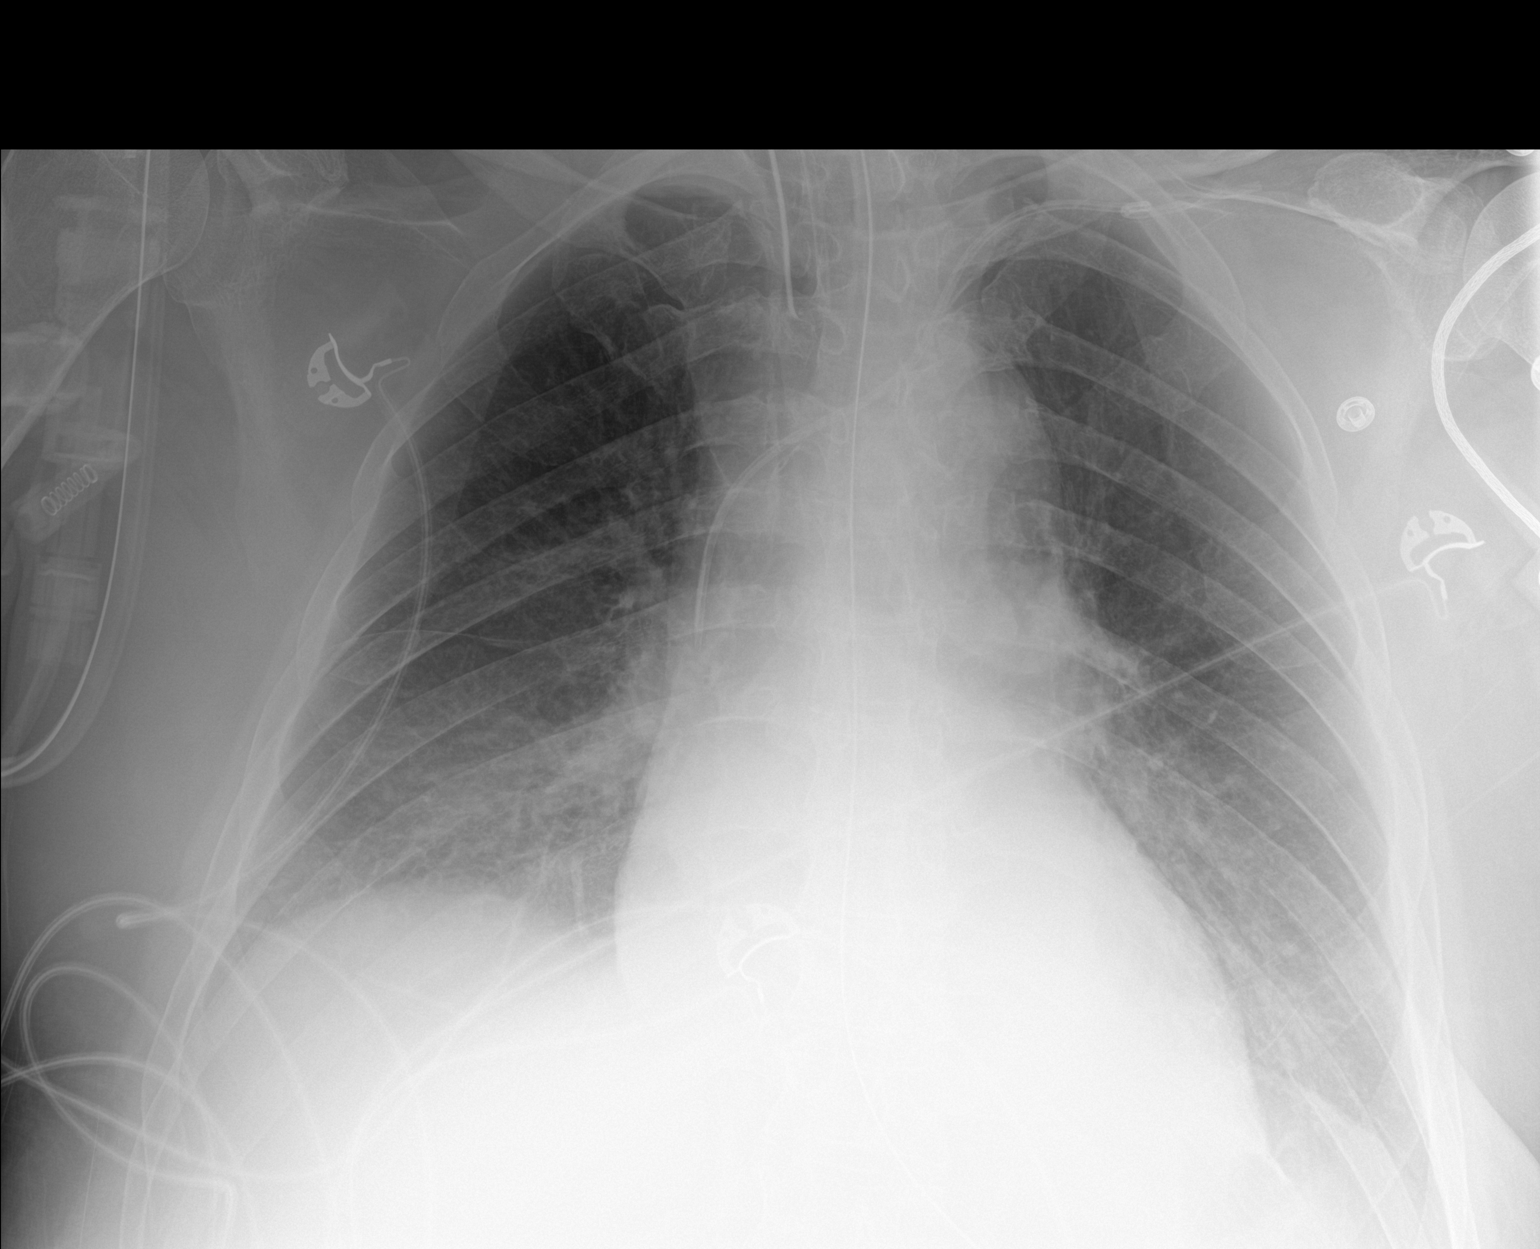

[1 of 1 positions shown; findings below may reference images not displayed]

FINDINGS: Endotracheal tube, NG tube, left subclavian line in stable position.
Cardiomegaly with mild bilateral from interstitial prominence and
small pleural effusions consistent CHF. Interim improvement from
prior exam.
IMPRESSION: Lines and tubes in stable position. CHF with interim improvement
from prior exam .

## 2017-11-13 IMAGING — CR DG CHEST 1V PORT
1 series · 1 of 1 positions shown · non-contrast
Comparison: Prior radiograph from 02/01/2017.

CLINICAL DATA: Evaluation for ventilator management.

EXAM:
PORTABLE CHEST 1 VIEW

[AP]
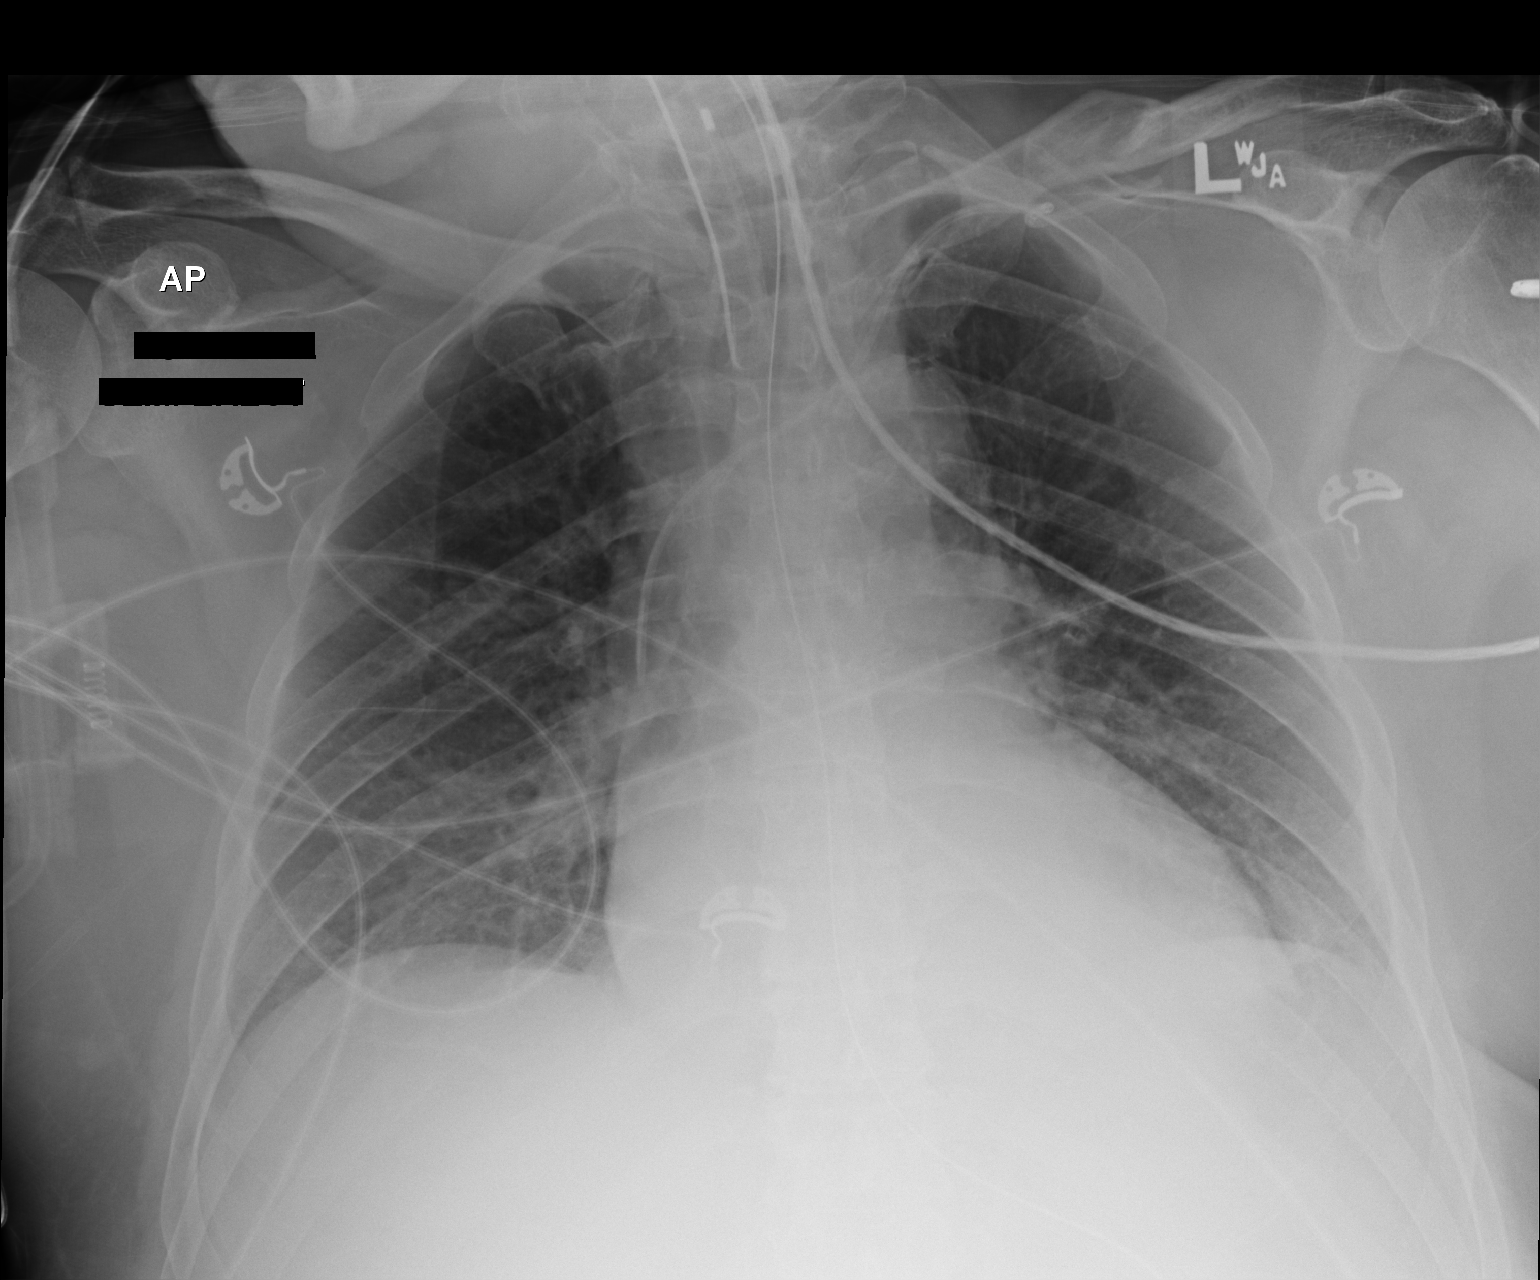

[1 of 1 positions shown; findings below may reference images not displayed]

FINDINGS: Patient is intubated with the tip of the endotracheal tube
positioned approximately 6 cm above the carina. Enteric tube courses
in the the abdomen. Left subclavian central venous catheter in
stable position.

Mild cardiomegaly, stable. Mediastinal silhouette within normal
limits.

Lungs hypoinflated. Interval improvement in pulmonary vascular
congestion as compared to previous. Persistent mild patchy left
basilar opacity, which may reflect atelectasis and/ or infiltrate.
No other new focal airspace disease. No pleural effusion. No
pneumothorax.

Osseous structures unchanged. Remotely healed left clavicular
fracture noted.
IMPRESSION: 1. Tip of the endotracheal tube 6 cm above the carina. Stable and
status satisfactory position of remaining support apparatus.
2. Persistent patchy left basilar opacity, which may reflect
atelectasis and/or infiltrate.
3. Interval improvement in pulmonary vascular congestion as compared
02/01/2017.

## 2017-11-14 IMAGING — CR DG CHEST 1V PORT
1 series · 1 of 1 positions shown · non-contrast
Comparison: Yesterday

CLINICAL DATA: Pneumonia

EXAM:
PORTABLE CHEST 1 VIEW

[AP]
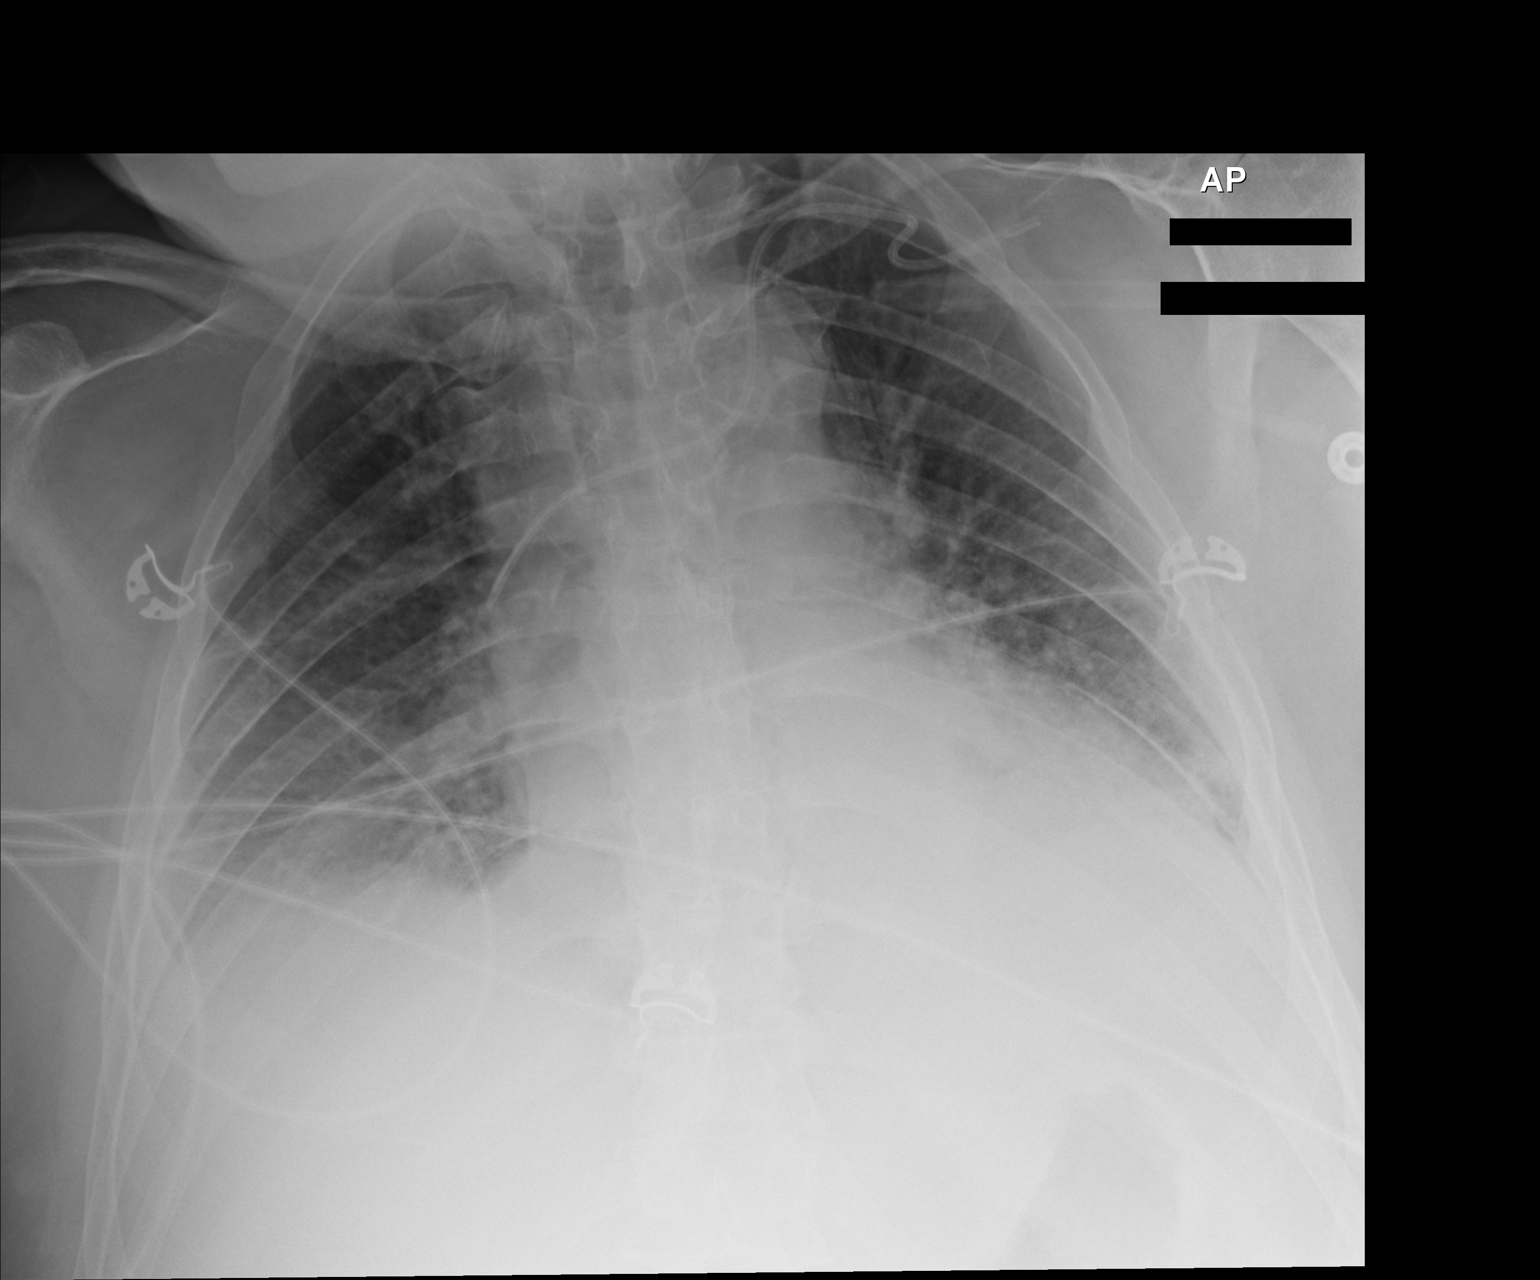

[1 of 1 positions shown; findings below may reference images not displayed]

FINDINGS: Lower volumes after extubation. Increased hazy density at the bases.
Left subclavian central line is in stable position. Chronic
cardiopericardial enlargement.
IMPRESSION: Lower volumes with increased atelectasis after extubation. History
of pneumonia, which may be obscured.

## 2017-11-14 IMAGING — CR DG ABD PORTABLE 1V
1 series · 1 of 1 positions shown · non-contrast
Comparison: Portable exam 2876 hours compared to 01/30/2017

CLINICAL DATA: Vomiting, was extubated yesterday

EXAM:
PORTABLE ABDOMEN - 1 VIEW

[AP]
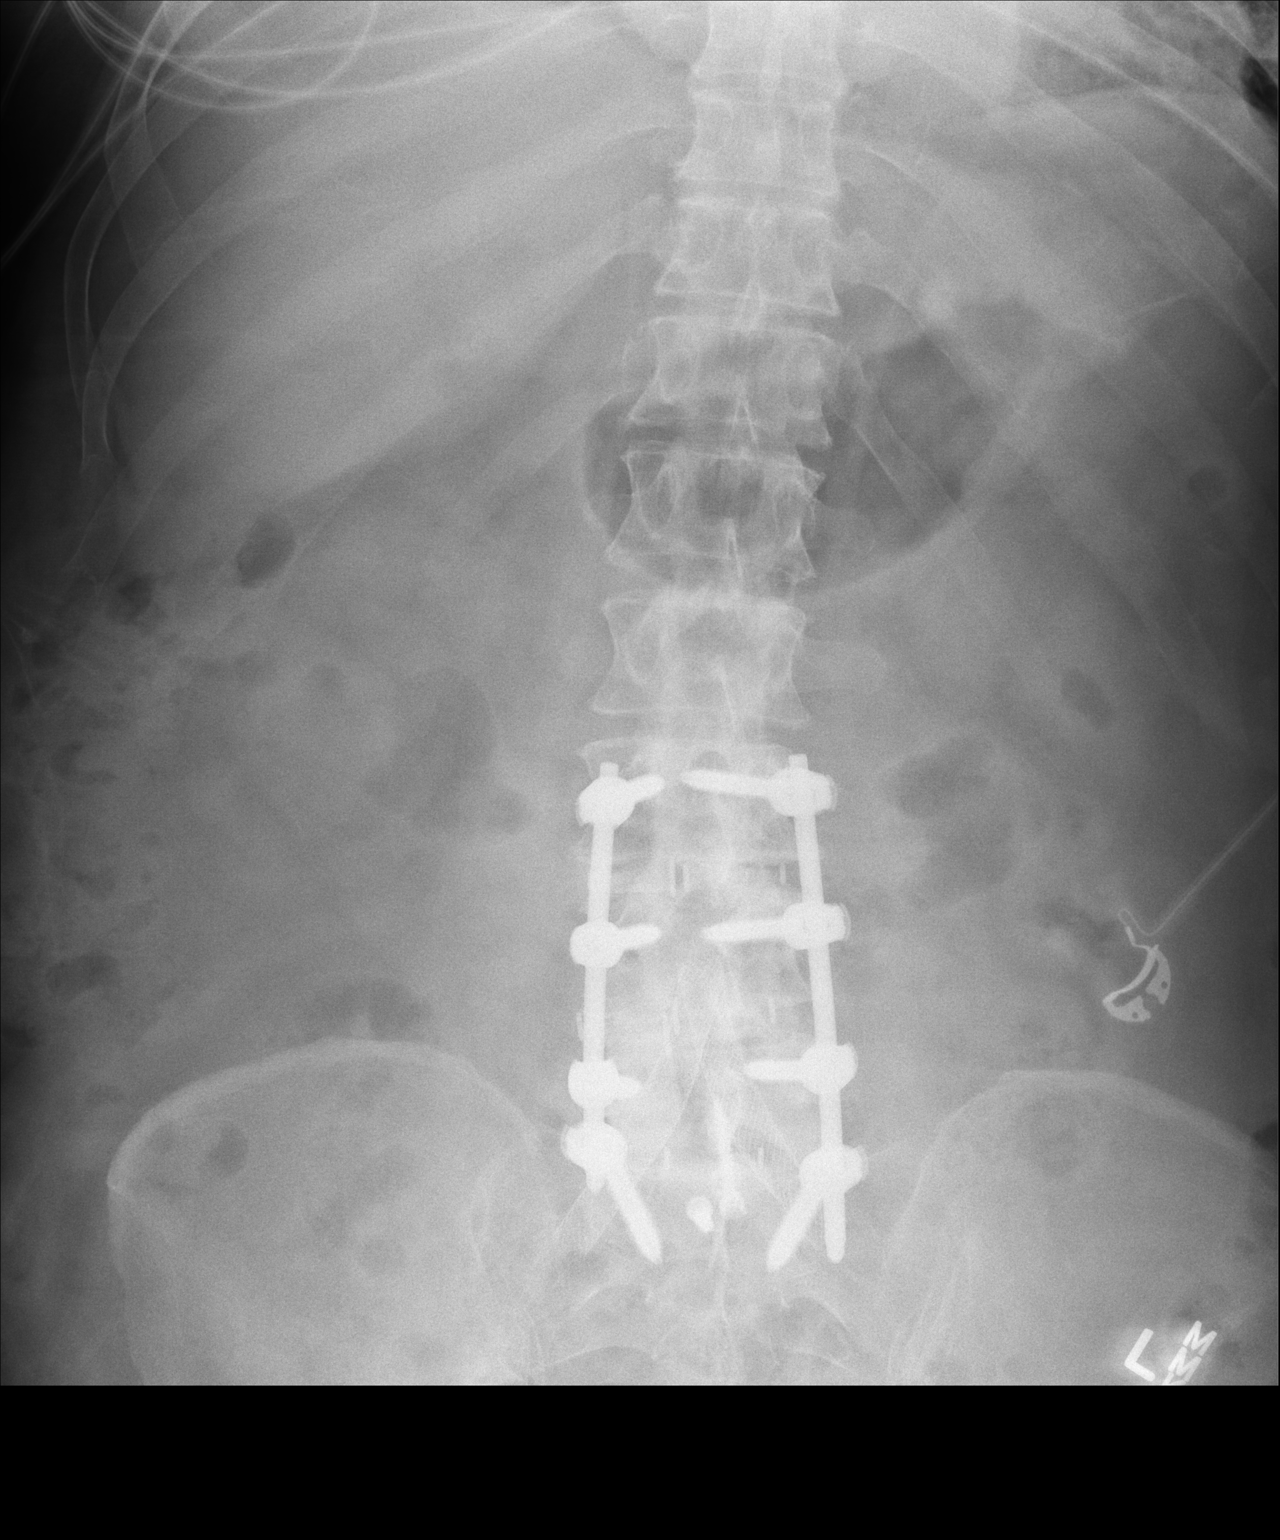

[1 of 1 positions shown; findings below may reference images not displayed]

FINDINGS: Interval removal of nasogastric tube.

Nonobstructive bowel gas pattern.

No bowel dilatation or bowel wall thickening.

Prior lumbosacral fusion L3-S1.

BILATERAL common iliac artery stents.

No urinary tract calcification.
IMPRESSION: Normal bowel gas pattern.

## 2017-11-28 ENCOUNTER — Ambulatory Visit (INDEPENDENT_AMBULATORY_CARE_PROVIDER_SITE_OTHER): Payer: Medicare Other | Admitting: Vascular Surgery

## 2017-11-28 ENCOUNTER — Ambulatory Visit (INDEPENDENT_AMBULATORY_CARE_PROVIDER_SITE_OTHER)
Admission: RE | Admit: 2017-11-28 | Discharge: 2017-11-28 | Disposition: A | Discharge status: Home / Self Care | Payer: Medicare Other | Source: Ambulatory Visit | Attending: Vascular Surgery | Admitting: Vascular Surgery

## 2017-11-28 ENCOUNTER — Encounter: Payer: Self-pay | Admitting: Vascular Surgery

## 2017-11-28 ENCOUNTER — Ambulatory Visit (HOSPITAL_COMMUNITY)
Admission: RE | Admit: 2017-11-28 | Discharge: 2017-11-28 | Disposition: A | Discharge status: Home / Self Care | Payer: Medicare Other | Source: Ambulatory Visit | Attending: Vascular Surgery | Admitting: Vascular Surgery

## 2017-11-28 VITALS — BP 144/87 | HR 79 | Temp 97.0°F | Resp 16 | Ht 70.0 in | Wt 223.0 lb

## 2017-11-28 DIAGNOSIS — I6523 Occlusion and stenosis of bilateral carotid arteries: Secondary | ICD-10-CM | POA: Insufficient documentation

## 2017-11-28 DIAGNOSIS — I739 Peripheral vascular disease, unspecified: Secondary | ICD-10-CM

## 2017-11-28 DIAGNOSIS — I6529 Occlusion and stenosis of unspecified carotid artery: Secondary | ICD-10-CM

## 2017-11-28 NOTE — Progress Notes (Signed)
HISTORY AND PHYSICAL     CC:  Follow up Requesting Provider:  [name], MD  HPI: This is a 71 y.o. male who comes in today for follow up for carotid disease s/p left CEA in 2011 and follow up for left femoral to popliteal bypass in 9/16.   He states that he has done well and has not had any unilateral weakness/numbness of his extremities or temporary blindness.  He describes some numbness and tingling of his left leg below the knee that is no different since his last visit.  He also has numbness on his right lateral thigh from the knee to hip that is also no different.  He has hx of back issues with multiple back operations.  He denies any cramping in his calves when he walks.  He states that he is able to walk better than before with his cane.    He had quit smoking at his last visit, but has started back and quit a couple of times since October.  He wants to quit and he and his son are working on this together.  His wife also smokes.    He takes a daily aspirin.  The pt is on a statin for cholesterol management.  He is on an ACEI, beta blocker and CCB for blood pressure control.    He is a [place] veteran.    Past Medical History:  Diagnosis Date  . Anemia   . Arrhythmia    takes metoprolol daily  . Arthritis   . Asthma   . CAD (coronary artery disease)   . Carotid artery occlusion    with Claudication  . Chronic back pain    stenosis  . Chronic kidney disease   . COPD (chronic obstructive pulmonary disease) (HCC)    Spirva daily and Albuterol as needed  . Dementia   . Depression   . Diabetes mellitus without complication (HCC)    Type 2 diet controlled.Never been on meds  . Gout    takes Uloric daily  . Head injury    as a child  . History of colon polyps    benign  . History of gastric ulcer   . History of hiatal hernia   . Hyperlipidemia    takes Atorvastatin and Fenofibrate daily  . Hypertension    takes Lisinopril,Amlodipine,and Hydralazine daily  .  Impaired memory    takes Namenda daily  . Numbness    lower left leg and upper right thigh  . Pneumonia    hx of-couple of yrs ago  . Shortness of breath dyspnea    with exertion  . Tuberculosis 2006    9 months   . Vertigo     Past Surgical History:  Procedure Laterality Date  . ABDOMINAL EXPOSURE N/A 01/22/2017   Procedure: ABDOMINAL EXPOSURE;  Surgeon: [name], MD;  Location: [place];  Service: Vascular;  Laterality: N/A;  . ANTERIOR LAT LUMBAR FUSION N/A 01/22/2017   Procedure: LUMBAR THREE-FOUR, LUMBAR FOUR-FIVE ANTERIOR LATERAL INTERBODY FUSION;  Surgeon: [name], MD;  Location: [place];  Service: Neurosurgery;  Laterality: N/A;  L3-4 L4-5 Lateral interbody fusion  . ANTERIOR LUMBAR FUSION N/A 01/22/2017   Procedure: LUMBAR FIVE-SACRUM ONE ANTERIOR LUMBAR INTERBODY FUSION WITH ABDOMINAL APPROACH BY DR [name];  Surgeon: [name], MD;  Location: [place];  Service: Neurosurgery;  Laterality: N/A;  . CAROTID BODY TUMOR EXCISION Left 09/21/2015   Procedure: EXCISE LEFT NECK NODULE WITH LOCAL ;  Surgeon: [name], MD;  Location: [place] [place];  Service: Vascular;  Laterality: Left;  . CAROTID ENDARTERECTOMY  Nov. 15,2011   LEFT cea  . cataract surgery Bilateral   . COLONOSCOPY    . EYE SURGERY    . FEMORAL-POPLITEAL BYPASS GRAFT Left 08/23/2015   Procedure: LEFT FEMORAL-POPLITEAL ARTERY BYPASS WITH GORETEX GRAFT;  Surgeon: [name], MD;  Location: [place];  Service: Vascular;  Laterality: Left;  . JOINT REPLACEMENT  1980   RIGHT  knee  . LUMBAR EPIDURAL INJECTION    . LUMBAR LAMINECTOMY/DECOMPRESSION MICRODISCECTOMY Right 02/17/2016   Procedure: Laminectomy and Foraminotomy - Lumbar four-five right;  Surgeon: [name], MD;  Location: [place] [facility];  Service: Neurosurgery;  Laterality: Right;  right  . LUMBAR PERCUTANEOUS PEDICLE SCREW 3 LEVEL N/A 01/22/2017   Procedure: LUMBAR THREE-SACRAL ONE PERCUTANEOUS PEDICLE SCREW FIXATION WITH ROBOTIC  ASSISTANCE;  Surgeon: [name], MD;  Location: [place];  Service: Neurosurgery;  Laterality: N/A;  L3 to S1 Percutaneous pedicle screw fixation  . PERIPHERAL VASCULAR CATHETERIZATION N/A 06/24/2015   Procedure: Abdominal Aortogram;  Surgeon: [name], MD;  Location: [place] [facility];  Service: Cardiovascular;  Laterality: N/A;  . Stents  Aug.  23, 1999   Bilateral iliofemoral  stents, Mar-Mac.  . TONSILLECTOMY      Allergies  Allergen Reactions  . Lopressor [Metoprolol Tartrate]     Severe bradycardia  . No Known Allergies     Current Outpatient Medications  Medication Sig Dispense Refill  . amLODipine (NORVASC) 10 MG tablet Take 10 mg by mouth daily.    [name] aspirin EC 81 MG tablet Take 81 mg by mouth daily.    [name] atorvastatin (LIPITOR) 80 MG tablet Take 80 mg by mouth at bedtime.     . fenofibrate (TRICOR) 145 MG tablet Take 145 mg by mouth at bedtime.     . furosemide (LASIX) 40 MG tablet Take 40 mg by mouth daily.    [name] gabapentin (NEURONTIN) 300 MG capsule Take 1 capsule (300 mg total) by mouth 3 (three) times daily. 90 capsule 2  . hydrALAZINE (APRESOLINE) 25 MG tablet Take 25 mg by mouth 2 (two) times daily.  0  . lisinopril (PRINIVIL,ZESTRIL) 40 MG tablet Take 40 mg by mouth daily.    . memantine (NAMENDA XR) 28 MG CP24 24 hr capsule Take 28 mg by mouth daily.    [name] oxyCODONE-acetaminophen (PERCOCET) 7.5-325 MG tablet Take 1 tablet by mouth every 4 (four) hours as needed for severe pain. 60 tablet 0  . ULORIC 80 MG TABS Take 80 mg by mouth daily.  5   No current facility-administered medications for this visit.     Family History  Problem Relation Age of Onset  . Diabetes Mother   . Hyperlipidemia Father   . Hypertension Father   . Other Father        Right Leg Amputation  . Heart disease Sister        Aneyrism     Social History   Socioeconomic History  . Marital status: Married    Spouse name: Not on file  . Number of children: Not on file  .  Years of education: Not on file  . Highest education level: Not on file  Social Needs  . Financial resource strain: Not on file  . Food insecurity - worry: Not on file  . Food insecurity - inability: Not on file  . Transportation needs -  medical: Not on file  . Transportation needs - non-medical: Not on file  Occupational History  . Not on file  Tobacco Use  . Smoking status: Former Smoker    Packs/day: 0.30    Types: Cigarettes    Last attempt to quit: 07/02/2016    Years since quitting: 1.4  . Smokeless tobacco: Never Used  . Tobacco comment: hasn't smoked in 3 days  Substance and Sexual Activity  . Alcohol use: No    Alcohol/week: 0.0 oz  . Drug use: No  . Sexual activity: Yes  Other Topics Concern  . Not on file  Social History Narrative  . Not on file     REVIEW OF SYSTEMS:  No changes from previous visit.  [X]  denotes positive finding, [ ]  denotes negative finding Cardiac  Comments:  Chest pain or chest pressure:    Shortness of breath upon exertion:    Short of breath when lying flat:    Irregular heart rhythm:        Vascular    Pain in calf, thigh, or hip brought on by ambulation:    Pain in feet at night that wakes you up from your sleep:     Blood clot in your veins:    Leg swelling:         Pulmonary    Oxygen at home:    Productive cough:     Wheezing:         Neurologic    Sudden weakness in arms or legs:     Sudden numbness in arms or legs:     Sudden onset of difficulty speaking or slurred speech:    Temporary loss of vision in one eye:     Problems with dizziness:         Gastrointestinal    Blood in stool:     Vomited blood:         Genitourinary    Burning when urinating:     Blood in urine:        Psychiatric    Major depression:         Hematologic    Bleeding problems:    Problems with blood clotting too easily:        Skin    Rashes or ulcers:        Constitutional    Fever or chills:      PHYSICAL  EXAMINATION:  Vitals:   11/28/17 1245 11/28/17 1248  BP: (!) 155/91 (!) 144/87  Pulse: 79   Resp: 16   Temp: (!) 97 F (36.1 C)   SpO2: 97%    Vitals:   11/28/17 1245  Weight: 223 lb (101.2 kg)  Height: 5\' 10"  (1.778 m)   Body mass index is 32 kg/m.  General:  WDWN in NAD; vital signs documented above Gait: slow; walks with a cane HENT: WNL, normocephalic Pulmonary: normal non-labored breathing , without Rales, rhonchi,  wheezing Cardiac: regular HR, without  Murmurs without carotid bruits Abdomen: soft, NT, no masses Skin: without rashes; left groin is without erythema Vascular Exam/Pulses:  Right Left  Radial 2+ (normal) 2+ (normal)  Femoral Unable to palpate due to body habitus 1+ (weak)  DP trace trace  PT Unable to palpate  Unable to palpate    Extremities: without ischemic changes, without Gangrene , without cellulitis; without open wounds; dry skin Musculoskeletal: no muscle wasting or atrophy  Neurologic: A&O X 3;  No focal weakness or paresthesias are detected Psychiatric:  The pt has Normal affect.   Non-Invasive Vascular Imaging:   ABI's 11/28/17: Right:  0.77 Left:  0.66  Previous ABI's on 09/20/16: Right:  0.85 Left:  0.75  LLE arterial duplex 11/28/17: Patent LLE bypass graft with elevated velocities of the native inflow artery (50-74%).  Perigraft hypoechoic fluid surrounding the proximal and mid segments of the graft.  (New finding since last exam in 2017).  Carotid duplex 11/28/17: Right ICA velocities suggests 60-79% stenosis Patent left carotid endarterectomy site with no evidence of hyperplasia   Pt meds includes: Statin:  Yes.   Beta Blocker:  Yes.   Aspirin:  Yes.   ACEI:  Yes.   ARB:  No. CCB use:  Yes Other Antiplatelet/Anticoagulant:  No   ASSESSMENT/PLAN:: 71 y.o. male with PAD with hx of left femoral to above knee popliteal bypass graft 08/23/15 and left carotid endarterectomy November 2011.     -pt LLE bypass is patent but  a little worsening of his ABI's today -pt left carotid endarterectomy site is patent without re-stenosis.  There is 60-70% stenosis of the right ICA the velocities are stable.   He is asymptomatic from this.  -he has started smoking again and wants to quit.  We discussed the importance of smoking cessation and he understands this.  -will have him return in 6 months rather than one year given the slight decrease in his ABI's.with repeat ABI's and carotid duplex.  He will contact us sooner if he has any issues.   -discussed with pt to keep his feet moisturized to help prevent worsening and developing wounds on his feet.    Leontine Locket, PA-C [facility] [phone]  Clinic MD:  Pt seen and examined with Dr. [name]   History and exam findings as above.  Patient currently has no claudication symptoms.  He complains of chronic pain in his right lateral thigh and left inguinal region.  He denies any fever or chills.  There is no redness in the left groin.  He apparently is scheduled for a CT scan in the near future for further evaluation of his left groin pain.  On his duplex exam today his carotids are fairly stable he still has a moderate right internal carotid artery stenosis which is asymptomatic.  His left internal carotid artery which underwent previous carotid endarterectomy 7 years ago was widely patent.  His duplex exam does show some perigraft fluid at the proximal and distal anastomoses.  This most likely represents seroma since he has no pointing signs of infection.  His ABIs have decreased slightly but he has no symptoms and there is no increased velocity through his graft.  In light of his decline in ABIs and the fact that he has begun smoking again we will schedule him for earlier follow-up and he will have a follow-up duplex of his carotids and his lower extremity bypass in 6 months time.  Ruta Hinds, MD [facility] Office:  [phone] Pager: [phone]

## 2017-11-29 NOTE — Addendum Note (Signed)
Addended by: Lianne Cure A on: 11/29/2017 11:10 AM   Modules accepted: Orders

## 2018-02-27 DIAGNOSIS — F0390 Unspecified dementia without behavioral disturbance: Secondary | ICD-10-CM | POA: Diagnosis not present

## 2018-02-27 DIAGNOSIS — R079 Chest pain, unspecified: Secondary | ICD-10-CM | POA: Diagnosis not present

## 2018-02-27 DIAGNOSIS — I10 Essential (primary) hypertension: Secondary | ICD-10-CM | POA: Diagnosis not present

## 2018-02-27 DIAGNOSIS — I6529 Occlusion and stenosis of unspecified carotid artery: Secondary | ICD-10-CM | POA: Diagnosis not present

## 2018-02-27 DIAGNOSIS — E785 Hyperlipidemia, unspecified: Secondary | ICD-10-CM

## 2018-02-27 DIAGNOSIS — F17200 Nicotine dependence, unspecified, uncomplicated: Secondary | ICD-10-CM | POA: Diagnosis not present

## 2018-02-28 DIAGNOSIS — I6529 Occlusion and stenosis of unspecified carotid artery: Secondary | ICD-10-CM | POA: Diagnosis not present

## 2018-02-28 DIAGNOSIS — R079 Chest pain, unspecified: Secondary | ICD-10-CM

## 2018-02-28 DIAGNOSIS — F0390 Unspecified dementia without behavioral disturbance: Secondary | ICD-10-CM | POA: Diagnosis not present

## 2018-02-28 DIAGNOSIS — E785 Hyperlipidemia, unspecified: Secondary | ICD-10-CM | POA: Diagnosis not present

## 2018-06-05 ENCOUNTER — Ambulatory Visit (HOSPITAL_COMMUNITY)
Admission: RE | Admit: 2018-06-05 | Discharge: 2018-06-05 | Disposition: A | Discharge status: Home / Self Care | Payer: Medicare Other | Source: Ambulatory Visit | Attending: Vascular Surgery | Admitting: Vascular Surgery

## 2018-06-05 ENCOUNTER — Other Ambulatory Visit: Payer: Self-pay

## 2018-06-05 ENCOUNTER — Ambulatory Visit (INDEPENDENT_AMBULATORY_CARE_PROVIDER_SITE_OTHER): Payer: Medicare Other | Admitting: Vascular Surgery

## 2018-06-05 ENCOUNTER — Encounter: Payer: Self-pay | Admitting: Vascular Surgery

## 2018-06-05 ENCOUNTER — Ambulatory Visit (INDEPENDENT_AMBULATORY_CARE_PROVIDER_SITE_OTHER)
Admission: RE | Admit: 2018-06-05 | Discharge: 2018-06-05 | Disposition: A | Discharge status: Home / Self Care | Payer: Medicare Other | Source: Ambulatory Visit | Attending: Vascular Surgery | Admitting: Vascular Surgery

## 2018-06-05 VITALS — BP 170/89 | HR 56 | Temp 97.1°F | Resp 18 | Ht 70.0 in | Wt 204.0 lb

## 2018-06-05 DIAGNOSIS — I6522 Occlusion and stenosis of left carotid artery: Secondary | ICD-10-CM

## 2018-06-05 DIAGNOSIS — I739 Peripheral vascular disease, unspecified: Secondary | ICD-10-CM | POA: Diagnosis present

## 2018-06-05 DIAGNOSIS — I6529 Occlusion and stenosis of unspecified carotid artery: Secondary | ICD-10-CM | POA: Diagnosis not present

## 2018-06-05 NOTE — Progress Notes (Signed)
Patient is a 72 year old male who returns for follow-up today.  He previously underwent left carotid endarterectomy in September 2016.  He also underwent a left femoral to above-knee popliteal bypass with propaten in 2016.  He denies any claudication symptoms.  He denies any symptoms of TIA amaurosis or stroke.  He overall says he has been doing well.  Unfortunately he has returned to smoking.  He has developed chronic pain in his left groin of unknown etiology despite a extensive work-up by multiple physicians.  He is on Neurontin aspirin and Lipitor for  Review of systems: He denies shortness of breath.  He does have some difficulty walking with some balance and what is probably some neuropathy issues.  Current Outpatient Medications on File Prior to Visit  Medication Sig Dispense Refill  . amLODipine (NORVASC) 10 MG tablet Take 10 mg by mouth daily.    [name] aspirin EC 81 MG tablet Take 81 mg by mouth daily.    [name] atorvastatin (LIPITOR) 80 MG tablet Take 80 mg by mouth at bedtime.     . fenofibrate (TRICOR) 145 MG tablet Take 145 mg by mouth at bedtime.     . furosemide (LASIX) 40 MG tablet Take 40 mg by mouth daily.    . hydrALAZINE (APRESOLINE) 25 MG tablet Take 25 mg by mouth 2 (two) times daily.  0  . lisinopril (PRINIVIL,ZESTRIL) 40 MG tablet Take 40 mg by mouth daily.    . memantine (NAMENDA XR) 28 MG CP24 24 hr capsule Take 28 mg by mouth daily.    [name] oxyCODONE-acetaminophen (PERCOCET) 7.5-325 MG tablet Take 1 tablet by mouth every 4 (four) hours as needed for severe pain. 60 tablet 0  . ULORIC 80 MG TABS Take 80 mg by mouth daily.  5  . gabapentin (NEURONTIN) 300 MG capsule Take 1 capsule (300 mg total) by mouth 3 (three) times daily. 90 capsule 2   No current facility-administered medications on file prior to visit.    Past Surgical History:  Procedure Laterality Date  . ABDOMINAL EXPOSURE N/A 01/22/2017   Procedure: ABDOMINAL EXPOSURE;  Surgeon: [name], MD;  Location: [facility];  Service: Vascular;  Laterality: N/A;  . ANTERIOR LAT LUMBAR FUSION N/A 01/22/2017   Procedure: LUMBAR THREE-FOUR, LUMBAR FOUR-FIVE ANTERIOR LATERAL INTERBODY FUSION;  Surgeon: [name], MD;  Location: [facility];  Service: Neurosurgery;  Laterality: N/A;  L3-4 L4-5 Lateral interbody fusion  . ANTERIOR LUMBAR FUSION N/A 01/22/2017   Procedure: LUMBAR FIVE-SACRUM ONE ANTERIOR LUMBAR INTERBODY FUSION WITH ABDOMINAL APPROACH BY DR [name];  Surgeon: [name], MD;  Location: [facility];  Service: Neurosurgery;  Laterality: N/A;  . CAROTID BODY TUMOR EXCISION Left 09/21/2015   Procedure: EXCISE LEFT NECK NODULE WITH LOCAL ;  Surgeon: [name], MD;  Location: [name];  Service: Vascular;  Laterality: Left;  . CAROTID ENDARTERECTOMY  Nov. 15,2011   LEFT cea  . cataract surgery Bilateral   . COLONOSCOPY    . EYE SURGERY    . FEMORAL-POPLITEAL BYPASS GRAFT Left 08/23/2015   Procedure: LEFT FEMORAL-POPLITEAL ARTERY BYPASS WITH GORETEX GRAFT;  Surgeon: [name], MD;  Location: [facility];  Service: Vascular;  Laterality: Left;  . JOINT REPLACEMENT  1980   RIGHT  knee  . LUMBAR EPIDURAL INJECTION    . LUMBAR LAMINECTOMY/DECOMPRESSION MICRODISCECTOMY Right 02/17/2016   Procedure: Laminectomy and Foraminotomy - Lumbar four-five right;  Surgeon: [name], MD;  Location: [facility];  Service: Neurosurgery;  Laterality: Right;  right  . LUMBAR PERCUTANEOUS PEDICLE SCREW 3 LEVEL N/A 01/22/2017   Procedure: LUMBAR THREE-SACRAL ONE PERCUTANEOUS PEDICLE SCREW FIXATION WITH ROBOTIC ASSISTANCE;  Surgeon: [name], MD;  Location: [facility];  Service: Neurosurgery;  Laterality: N/A;  L3 to S1 Percutaneous pedicle screw fixation  . PERIPHERAL VASCULAR CATHETERIZATION N/A 06/24/2015   Procedure: Abdominal Aortogram;  Surgeon: [name], MD;  Location: [facility];  Service: Cardiovascular;  Laterality: N/A;  . Stents  Aug.  23, 1999   Bilateral iliofemoral  stents, [place].  . TONSILLECTOMY      Physical exam:  Vitals:   06/05/18 1400 06/05/18 1403  BP: (!) 182/87 (!) 170/89  Pulse: (!) 56   Resp: 18   Temp: (!) 97.1 F (36.2 C)   TempSrc: Oral   SpO2: 98%   Weight: 204 lb (92.5 kg)   Height: 5\' 10"  (1.778 m)     Extremities: 2+ femoral pulses absent popliteal and pedal pulses bilaterally  Neck: Well-healed neck incision no carotid bruit  Neuro: Symmetric upper extremity lower extremity motor strength 5/5 no facial asymmetry  Cardiac: Regular rate and rhythm without murmur  Chest: Clear to auscultation bilaterally  Data: Patient had a duplex exam his lower extremity which shows the femoropopliteal on the left side is now occluded.  ABI on the left was 0.7 right was 0.88  Assessment: Left femoropopliteal occlusion but reasonable perfusion at this point.  He has chronic left groin pain.  Since he is not currently at risk of limb threatening ischemia and does not really exhibit any claudication symptoms in the left leg I believe we will observe this for now.  Plan: The patient will have follow-up carotid duplex and bilateral ABIs in 6 months.  He will return sooner if he develops rest pain or nonhealing wounds in the foot or debilitating claudication symptoms.  Ruta Hinds, MD [facility] Office: [phone] Pager: [phone]

## 2018-06-06 ENCOUNTER — Other Ambulatory Visit: Payer: Self-pay

## 2018-06-06 DIAGNOSIS — I739 Peripheral vascular disease, unspecified: Secondary | ICD-10-CM

## 2018-06-06 DIAGNOSIS — I6522 Occlusion and stenosis of left carotid artery: Secondary | ICD-10-CM

## 2018-12-04 ENCOUNTER — Ambulatory Visit (HOSPITAL_COMMUNITY)
Admission: RE | Admit: 2018-12-04 | Discharge: 2018-12-04 | Disposition: A | Discharge status: Home / Self Care | Payer: Medicare Other | Source: Ambulatory Visit | Attending: Family | Admitting: Family

## 2018-12-04 ENCOUNTER — Ambulatory Visit (INDEPENDENT_AMBULATORY_CARE_PROVIDER_SITE_OTHER)
Admission: RE | Admit: 2018-12-04 | Discharge: 2018-12-04 | Disposition: A | Discharge status: Home / Self Care | Payer: Medicare Other | Source: Ambulatory Visit | Attending: Family | Admitting: Family

## 2018-12-04 ENCOUNTER — Ambulatory Visit: Payer: Medicare Other | Admitting: Vascular Surgery

## 2018-12-04 DIAGNOSIS — I6522 Occlusion and stenosis of left carotid artery: Secondary | ICD-10-CM | POA: Insufficient documentation

## 2018-12-04 DIAGNOSIS — I739 Peripheral vascular disease, unspecified: Secondary | ICD-10-CM

## 2018-12-25 ENCOUNTER — Other Ambulatory Visit: Payer: Self-pay

## 2018-12-25 ENCOUNTER — Ambulatory Visit (INDEPENDENT_AMBULATORY_CARE_PROVIDER_SITE_OTHER): Payer: Medicare Other | Admitting: Vascular Surgery

## 2018-12-25 ENCOUNTER — Other Ambulatory Visit: Payer: Self-pay | Admitting: *Deleted

## 2018-12-25 ENCOUNTER — Encounter: Payer: Self-pay | Admitting: Vascular Surgery

## 2018-12-25 ENCOUNTER — Encounter: Payer: Self-pay | Admitting: *Deleted

## 2018-12-25 VITALS — BP 112/67 | HR 72 | Temp 97.6°F | Resp 20 | Ht 70.0 in | Wt 204.0 lb

## 2018-12-25 DIAGNOSIS — I739 Peripheral vascular disease, unspecified: Secondary | ICD-10-CM

## 2018-12-25 DIAGNOSIS — I6523 Occlusion and stenosis of bilateral carotid arteries: Secondary | ICD-10-CM | POA: Diagnosis not present

## 2018-12-25 NOTE — Progress Notes (Signed)
Patient name: Joseph Hebert. MRN: [id number] DOB: [birth year] Sex: male  HPI: Joseph Hebert. is a 73 y.o. male history of peripheral arterial disease.  He previously underwent a left femoral to above-knee popliteal bypass but this occluded in 2016.  He is been fairly debilitated with claudication symptoms.  He denies rest pain or nonhealing wounds.  Of note he is also had previous left carotid endarterectomy in 2016.  He has had no problems since then.  He has no symptoms of TIA amaurosis or stroke.  At this point he feels he is debilitated enough that he would like to consider redo bypass in his left leg.  He would like to play golf and is unable to walk even 1 hold because of his claudication symptoms.  This has severely limited his lifestyle.  Other medical problems include coronary artery disease, chronic back pain, COPD, diabetes all of which are currently stable.  He currently is actively smoking and I discussed with him today that any redo bypass would be a very limited durability if he is unable to quit smoking.  He will try to quit again.  He is on aspirin and a statin.  Past Medical History:  Diagnosis Date  . Anemia   . Arrhythmia    takes metoprolol daily  . Arthritis   . Asthma   . CAD (coronary artery disease)   . Carotid artery occlusion    with Claudication  . Chronic back pain    stenosis  . Chronic kidney disease   . COPD (chronic obstructive pulmonary disease) (HCC)    Spirva daily and Albuterol as needed  . Dementia ([place])   . Depression   . Diabetes mellitus without complication (HCC)    Type 2 diet controlled.Never been on meds  . Gout    takes Uloric daily  . Head injury    as a child  . History of colon polyps    benign  . History of gastric ulcer   . History of hiatal hernia   . Hyperlipidemia    takes Atorvastatin and Fenofibrate daily  . Hypertension    takes Lisinopril,Amlodipine,and Hydralazine daily  . Impaired memory    takes Namenda daily   . Numbness    lower left leg and upper right thigh  . Pneumonia    hx of-couple of yrs ago  . Shortness of breath dyspnea    with exertion  . Tuberculosis 2006    9 months   . Vertigo    Past Surgical History:  Procedure Laterality Date  . ABDOMINAL EXPOSURE N/A 01/22/2017   Procedure: ABDOMINAL EXPOSURE;  Surgeon: [name], MD;  Location: [facility];  Service: Vascular;  Laterality: N/A;  . ANTERIOR LAT LUMBAR FUSION N/A 01/22/2017   Procedure: LUMBAR THREE-FOUR, LUMBAR FOUR-FIVE ANTERIOR LATERAL INTERBODY FUSION;  Surgeon: [name], MD;  Location: [facility];  Service: Neurosurgery;  Laterality: N/A;  L3-4 L4-5 Lateral interbody fusion  . ANTERIOR LUMBAR FUSION N/A 01/22/2017   Procedure: LUMBAR FIVE-SACRUM ONE ANTERIOR LUMBAR INTERBODY FUSION WITH ABDOMINAL APPROACH BY DR [name];  Surgeon: [name], MD;  Location: [facility];  Service: Neurosurgery;  Laterality: N/A;  . CAROTID BODY TUMOR EXCISION Left 09/21/2015   Procedure: EXCISE LEFT NECK NODULE WITH LOCAL ;  Surgeon: [name], MD;  Location: [facility];  Service: Vascular;  Laterality: Left;  . CAROTID ENDARTERECTOMY  Nov. 15,2011   LEFT cea  . cataract surgery Bilateral   .  COLONOSCOPY    . EYE SURGERY    . FEMORAL-POPLITEAL BYPASS GRAFT Left 08/23/2015   Procedure: LEFT FEMORAL-POPLITEAL ARTERY BYPASS WITH GORETEX GRAFT;  Surgeon: [name], MD;  Location: [facility];  Service: Vascular;  Laterality: Left;  . JOINT REPLACEMENT  1980   RIGHT  knee  . LUMBAR EPIDURAL INJECTION    . LUMBAR LAMINECTOMY/DECOMPRESSION MICRODISCECTOMY Right 02/17/2016   Procedure: Laminectomy and Foraminotomy - Lumbar four-five right;  Surgeon: [name], MD;  Location: [facility];  Service: Neurosurgery;  Laterality: Right;  right  . LUMBAR PERCUTANEOUS PEDICLE SCREW 3 LEVEL N/A 01/22/2017   Procedure: LUMBAR THREE-SACRAL ONE PERCUTANEOUS PEDICLE SCREW FIXATION WITH ROBOTIC ASSISTANCE;  Surgeon: [name],  MD;  Location: [facility];  Service: Neurosurgery;  Laterality: N/A;  L3 to S1 Percutaneous pedicle screw fixation  . PERIPHERAL VASCULAR CATHETERIZATION N/A 06/24/2015   Procedure: Abdominal Aortogram;  Surgeon: [name], MD;  Location: [facility];  Service: Cardiovascular;  Laterality: N/A;  . Stents  Aug.  23, 1999   Bilateral iliofemoral  stents, [place].  . TONSILLECTOMY      Family History  Problem Relation Age of Onset  . Diabetes Mother   . Hyperlipidemia Father   . Hypertension Father   . Other Father        Right Leg Amputation  . Heart disease Sister        Aneyrism     SOCIAL HISTORY: Social History   Socioeconomic History  . Marital status: Married    Spouse name: Not on file  . Number of children: Not on file  . Years of education: Not on file  . Highest education level: Not on file  Occupational History  . Not on file  Social Needs  . Financial resource strain: Not on file  . Food insecurity:    Worry: Not on file    Inability: Not on file  . Transportation needs:    Medical: Not on file    Non-medical: Not on file  Tobacco Use  . Smoking status: Former Smoker    Packs/day: 0.30    Types: Cigarettes    Last attempt to quit: 07/02/2016    Years since quitting: 2.4  . Smokeless tobacco: Never Used  . Tobacco comment: hasn't smoked in 3 days  Substance and Sexual Activity  . Alcohol use: No    Alcohol/week: 0.0 standard drinks  . Drug use: No  . Sexual activity: Yes  Lifestyle  . Physical activity:    Days per week: Not on file    Minutes per session: Not on file  . Stress: Not on file  Relationships  . Social connections:    Talks on phone: Not on file    Gets together: Not on file    Attends religious service: Not on file    Active member of club or organization: Not on file    Attends meetings of clubs or organizations: Not on file    Relationship status: Not on file  . Intimate partner violence:    Fear of current or ex partner:  Not on file    Emotionally abused: Not on file    Physically abused: Not on file    Forced sexual activity: Not on file  Other Topics Concern  . Not on file  Social History Narrative  . Not on file    Allergies  Allergen Reactions  . Lopressor [Metoprolol Tartrate]     (Pt  denies).  Severe bradycardia  . No Known Allergies     Current Outpatient Medications  Medication Sig Dispense Refill  . amLODipine (NORVASC) 10 MG tablet Take 10 mg by mouth daily.    [name] aspirin EC 81 MG tablet Take 81 mg by mouth daily.    [name] atorvastatin (LIPITOR) 80 MG tablet Take 80 mg by mouth at bedtime.     . fenofibrate (TRICOR) 145 MG tablet Take 145 mg by mouth at bedtime.     . furosemide (LASIX) 40 MG tablet Take 40 mg by mouth daily.    . hydrALAZINE (APRESOLINE) 25 MG tablet Take 25 mg by mouth 2 (two) times daily.  0  . lisinopril (PRINIVIL,ZESTRIL) 40 MG tablet Take 40 mg by mouth daily.    . memantine (NAMENDA XR) 28 MG CP24 24 hr capsule Take 28 mg by mouth daily.    [name] ULORIC 80 MG TABS Take 80 mg by mouth daily.  5   No current facility-administered medications for this visit.     ROS:   General:  No weight loss, Fever, chills  HEENT: No recent headaches, no nasal bleeding, no visual changes, no sore throat  Neurologic: No dizziness, blackouts, seizures. No recent symptoms of stroke or mini- stroke. No recent episodes of slurred speech, or temporary blindness.  Cardiac: No recent episodes of chest pain/pressure, no shortness of breath at rest.  + shortness of breath with exertion.  Denies history of atrial fibrillation or irregular heartbeat  Vascular: No history of rest pain in feet.  + history of claudication.  No history of non-healing ulcer, No history of DVT   Pulmonary: No home oxygen, no productive cough, no hemoptysis,  No asthma or wheezing  Musculoskeletal:  [X]  Arthritis, [X]  Low back pain,  [X]  Joint pain  Hematologic:No history of hypercoagulable state.  No history of  easy bleeding.  No history of anemia  Gastrointestinal: No hematochezia or melena,  No gastroesophageal reflux, no trouble swallowing  Urinary: [ ]  chronic Kidney disease, [ ]  on HD - [ ]  MWF or [ ]  TTHS, [ ]  Burning with urination, [ ]  Frequent urination, [ ]  Difficulty urinating;   Skin: No rashes  Psychological: No history of anxiety,  No history of depression   Physical Examination  Vitals:   12/25/18 1528 12/25/18 1530  BP: (!) 146/76 112/67  Pulse: 72   Resp: 20   Temp: 97.6 F (36.4 C)   SpO2: 97%   Weight: 204 lb (92.5 kg)   Height: 5\' 10"  (1.778 m)     Body mass index is 29.27 kg/m.  General:  Alert and oriented, no acute distress HEENT: Normal Neck: No bruit or JVD Pulmonary: Clear to auscultation bilaterally Cardiac: Regular Rate and Rhythm without murmur Abdomen: Soft, non-tender, non-distended, no mass Skin: No rash Extremity Pulses:  2+ radial, brachial, femoral, percent dorsalis pedis, posterior tibial pulses bilaterally Musculoskeletal: No deformity or edema  Neurologic: Upper and lower extremity motor 5/5 and symmetric  DATA:  Patient had bilateral ABIs performed recently which were 0.6 on the left 0.9 on the right  ASSESSMENT: With left leg peripheral arterial disease previously failed left leg bypass wishes to consider redo revascularization of the left leg for debilitating claudication symptoms.  He understands that he is not currently at risk of limb loss.  He understands that with revascularization procedures he could certainly become worse over time.  He states that basically he does not have a good quality of life due  to his inability to walk a distance.   PLAN: Aortogram bilateral lower extremity runoff possible intervention scheduled for January 09, 2019.  We will also get him scheduled for cardiac risk gratification.  Depending on his arteriogram we will consider endovascular versus open bypass operation.   Ruta Hinds, MD [facility] Office: [phone] Pager: [phone]

## 2018-12-31 NOTE — Progress Notes (Addendum)
Cardiology Office Note:    Date:  01/01/2019   ID:  Joseph Hebert., DOB February 23, 1946, MRN [id number]  PCP:  [name], MD  Cardiologist:  Shirlee More, MD    Referring MD: [name], MD    ASSESSMENT:    1. Preoperative cardiovascular examination   2. Essential hypertension   3. Hyperlipidemia, unspecified hyperlipidemia type   4. CKD (chronic kidney disease) stage 3, GFR 30-59 ml/min (HCC)   5. PAD (peripheral artery disease) (HCC)    PLAN:    In order of problems listed above:  His myocardial perfusion study shows no evidence of ischemia he should proceed with his planned angiography and revascularization  1. Anticipates surgical revascularization high risk procedure and vascular surgery left lower extremity.  He has no known heart disease but is having PVCs frequent fighting EKG and exertional shortness of breath.  Further evaluation with a pharmacologic myocardial perfusion study and unless he has high risk markers I would not consider preoperative revascularization.  We will try to streamline this to have it done as quickly as possible and not interfere with his anticipated angiography 01/09/2019. 2. Stable continue current treatment including long-acting calcium channel blocker diuretic 3. Stable continue his high intensity statin with PAD 4. Stable 5. Symptomatic to undergo angiography and anticipates surgical redo revascularization left lower extremity high risk see discussion under preoperative evaluation   Next appointment: 6 weeks   Medication Adjustments/Labs and Tests Ordered: Current medicines are reviewed at length with the patient today.  Concerns regarding medicines are outlined above.  No orders of the defined types were placed in this encounter.  No orders of the defined types were placed in this encounter.   No chief complaint on file.   History of Present Illness:    Joseph Welles. is a 73 y.o. male with a hx of PAD,  dyslipidemia T2 DM, hypertension and CKD last seen by me 07/18/15 at [facility] cardiology. He had L femoral-popliteal bypass 2016 and left CEA 2016.he is scheduled for aortography and run off 01/09/19. Compliance with diet, lifestyle and medications: Yes  His past medical history notes CAD but he had normal coronary angiography in 2016 preceding epic.  He has had no chest pain palpitations syncope but notices an irregular pulse is having PVCs on his EKG in the office.  He has trouble with physical activity mostly related to claudication left leg also some breathlessness and fatigue no chest pain.  He anticipates surgical revascularization left lower extremity redo and for further evaluation preoperative undergo myocardial perfusion study especially with his exertional shortness of breath and his PVCs.  His exercise tolerance is limited. Past Medical History:  Diagnosis Date  . Acute encephalopathy   . Acute hypoxemic respiratory failure ([place])   . Acute pulmonary edema (HCC)   . Acute respiratory failure with hypoxia ([place])   . Aftercare following surgery of the circulatory system, [place] 01/01/2014  . Anemia   . Arrhythmia    takes metoprolol daily  . Arthritis   . Asthma   . Bilateral leg pain 06/16/2015  . CAD (coronary artery disease)   . Cardiac asystole ([place])   . Cardiogenic shock ([place])   . Carotid artery occlusion    with Claudication  . Chronic back pain    stenosis  . Chronic kidney disease   . CKD (chronic kidney disease) stage 3, GFR 30-59 ml/min (HCC) 07/18/2015  . COPD (chronic obstructive pulmonary disease) ([place])    Lebron Conners  daily and Albuterol as needed  . Dementia ([place])   . Depression   . Diabetes mellitus without complication (HCC)    Type 2 diet controlled.Never been on meds  . Gout    takes Uloric daily  . HCAP (healthcare-associated pneumonia)   . Head injury    as a child  . History of colon polyps    benign  . History of gastric ulcer   . History of hiatal hernia   .  Hyperlipidemia    takes Atorvastatin and Fenofibrate daily  . Hypertension    takes Lisinopril,Amlodipine,and Hydralazine daily  . Impaired memory    takes Namenda daily  . Intermittent claudication ([place]) 07/18/2015   Overview:  Operative management: The patient will be scheduled for a left femoral to  popliteal bypass in the near future after cardiac risk stratification. He  does have some superficial femoral and tibial artery occlusive disease in  the right leg. However his ABI on the right side is greater than 0.9.  Hopefully he will improve with just a walking program the right leg alone.  If not we could c  . Lumbosacral spondylosis with radiculopathy 02/17/2016  . Nevus of choroid of left eye 12/08/2014  . Numbness    lower left leg and upper right thigh  . Occlusion and stenosis of carotid artery without mention of cerebral infarction 01/01/2013  . Pneumonia    hx of-couple of yrs ago  . Preoperative cardiovascular examination 07/17/2015   Overview:  Echo 2013 with EF 55-60% Lexiscan MPS 11/12/12 with normal perfusion and function  Overview:  Echo 2013 with EF 55-60% Lexiscan MPS 11/12/12 with normal perfusion and function  . Serous retinal detachment of left eye 12/08/2014  . Shortness of breath dyspnea    with exertion  . Status post intraocular lens implant 12/08/2014  . Tuberculosis 2006    9 months   . Vertigo     Past Surgical History:  Procedure Laterality Date  . ABDOMINAL EXPOSURE N/A 01/22/2017   Procedure: ABDOMINAL EXPOSURE;  Surgeon: [name], MD;  Location: [place];  Service: Vascular;  Laterality: N/A;  . ANTERIOR LAT LUMBAR FUSION N/A 01/22/2017   Procedure: LUMBAR THREE-FOUR, LUMBAR FOUR-FIVE ANTERIOR LATERAL INTERBODY FUSION;  Surgeon: [name], MD;  Location: [place];  Service: Neurosurgery;  Laterality: N/A;  L3-4 L4-5 Lateral interbody fusion  . ANTERIOR LUMBAR FUSION N/A 01/22/2017   Procedure: LUMBAR FIVE-SACRUM ONE ANTERIOR LUMBAR INTERBODY  FUSION WITH ABDOMINAL APPROACH BY DR [name];  Surgeon: [name], MD;  Location: [place];  Service: Neurosurgery;  Laterality: N/A;  . CAROTID BODY TUMOR EXCISION Left 09/21/2015   Procedure: EXCISE LEFT NECK NODULE WITH LOCAL ;  Surgeon: [name], MD;  Location: [place];  Service: Vascular;  Laterality: Left;  . CAROTID ENDARTERECTOMY  Nov. 15,2011   LEFT cea  . cataract surgery Bilateral   . COLONOSCOPY    . EYE SURGERY    . FEMORAL-POPLITEAL BYPASS GRAFT Left 08/23/2015   Procedure: LEFT FEMORAL-POPLITEAL ARTERY BYPASS WITH GORETEX GRAFT;  Surgeon: [name], MD;  Location: [place];  Service: Vascular;  Laterality: Left;  . JOINT REPLACEMENT  1980   RIGHT  knee  . LUMBAR EPIDURAL INJECTION    . LUMBAR LAMINECTOMY/DECOMPRESSION MICRODISCECTOMY Right 02/17/2016   Procedure: Laminectomy and Foraminotomy - Lumbar four-five right;  Surgeon: [name], MD;  Location: [place] [facility];  Service: Neurosurgery;  Laterality: Right;  right  . LUMBAR PERCUTANEOUS PEDICLE  SCREW 3 LEVEL N/A 01/22/2017   Procedure: LUMBAR THREE-SACRAL ONE PERCUTANEOUS PEDICLE SCREW FIXATION WITH ROBOTIC ASSISTANCE;  Surgeon: [name], MD;  Location: [place];  Service: Neurosurgery;  Laterality: N/A;  L3 to S1 Percutaneous pedicle screw fixation  . PERIPHERAL VASCULAR CATHETERIZATION N/A 06/24/2015   Procedure: Abdominal Aortogram;  Surgeon: [name], MD;  Location: [place] CV LAB;  Service: Cardiovascular;  Laterality: N/A;  . Stents  Aug.  23, 1999   Bilateral iliofemoral  stents, [place].  . TONSILLECTOMY      Current Medications: Current Meds  Medication Sig  . amLODipine (NORVASC) 10 MG tablet Take 10 mg by mouth daily.  [name] aspirin EC 81 MG tablet Take 81 mg by mouth daily.  [name] atorvastatin (LIPITOR) 80 MG tablet Take 80 mg by mouth at bedtime.   . Cholecalciferol (VITAMIN D3) 50 MCG (2000 UT) capsule Take 2,000 Units by mouth daily.  [name] escitalopram (LEXAPRO) 10 MG tablet  Take 10 mg by mouth daily.  . ferrous sulfate 325 (65 FE) MG tablet Take 325 mg by mouth daily.   . furosemide (LASIX) 20 MG tablet Take 20 mg by mouth daily.   [name] lidocaine (LIDODERM) 5 % APPLY 1 3 PATCHES ACROSS LOWER BACK FOR 12 HOURS ON REMOVE AFTER 12 HOURS AND REPEAT CYCLE ONCE DAILY AS NEEDED.  . memantine (NAMENDA) 10 MG tablet Take 10 mg by mouth daily.  . pantoprazole (PROTONIX) 40 MG tablet Take 40 mg by mouth daily.  [name] ULORIC 40 MG tablet Take 40 mg by mouth daily.      Allergies:   Lopressor [metoprolol tartrate]   Social History   Socioeconomic History  . Marital status: Married    Spouse name: Not on file  . Number of children: Not on file  . Years of education: Not on file  . Highest education level: Not on file  Occupational History  . Not on file  Social Needs  . Financial resource strain: Not on file  . Food insecurity:    Worry: Not on file    Inability: Not on file  . Transportation needs:    Medical: Not on file    Non-medical: Not on file  Tobacco Use  . Smoking status: Current Every Day Smoker    Packs/day: 0.30    Types: Cigarettes  . Smokeless tobacco: Never Used  Substance and Sexual Activity  . Alcohol use: No    Alcohol/week: 0.0 standard drinks  . Drug use: No  . Sexual activity: Yes  Lifestyle  . Physical activity:    Days per week: Not on file    Minutes per session: Not on file  . Stress: Not on file  Relationships  . Social connections:    Talks on phone: Not on file    Gets together: Not on file    Attends religious service: Not on file    Active member of club or organization: Not on file    Attends meetings of clubs or organizations: Not on file    Relationship status: Not on file  Other Topics Concern  . Not on file  Social History Narrative  . Not on file     Family History: The patient's family history includes Diabetes in his mother; Heart disease in his sister; Hyperlipidemia in his father; Hypertension in his father;  Other in his father. ROS:  Review of Systems  Constitution: Positive for malaise/fatigue.  HENT: Negative.   Eyes: Negative.   Cardiovascular:  Positive for palpitations.  Respiratory: Positive for shortness of breath.   Endocrine: Negative.   Hematologic/Lymphatic: Negative.   Skin: Negative.   Musculoskeletal: Positive for muscle cramps.  Gastrointestinal: Negative.   Genitourinary: Negative.   Neurological: Negative.   Psychiatric/Behavioral: Negative.    Please see the history of present illness.    All other systems reviewed and are negative.  EKGs/Labs/Other Studies Reviewed:    The following studies were reviewed today:  EKG:  EKG ordered today.  The ekg ordered today demonstrates sinus rhythm with frequent PVCs otherwise normal.  Echo 01/27/17:   Study Conclusions - Left ventricle: The cavity size was normal. Wall thickness was   increased in a pattern of mild LVH. Systolic function was   vigorous. The estimated ejection fraction was in the range of 65%   to 70%. Wall motion was normal; there were no regional wall   motion abnormalities. Doppler parameters are consistent with   abnormal left ventricular relaxation (grade 1 diastolic   dysfunction). - Right atrium: The atrium was mildly dilated. - Pulmonary arteries: Systolic pressure was moderately increased.   PA peak pressure: 54 mm Hg (S).  Recent Labs: No results found for requested labs within last 8760 hours.  Recent Lipid Panel No results found for: CHOL, TRIG, HDL, CHOLHDL, VLDL, LDLCALC, LDLDIRECT  Physical Exam:    VS:  BP 132/78 (BP Location: Left Arm, Patient Position: Sitting, Cuff Size: Normal)   Pulse 72   Ht 5\' 10"  (1.778 m)   Wt 189 lb 6.4 oz (85.9 kg)   SpO2 97%   BMI 27.18 kg/m     Wt Readings from Last 3 Encounters:  01/01/19 189 lb 6.4 oz (85.9 kg)  12/25/18 204 lb (92.5 kg)  06/05/18 204 lb (92.5 kg)     GEN:  Well nourished, well developed in no acute distress HEENT:  Normal NECK: No JVD; No carotid bruits LYMPHATICS: No lymphadenopathy CARDIAC: RRR, no murmurs, rubs, gallops RESPIRATORY:  Clear to auscultation without rales, wheezing or rhonchi  ABDOMEN: Soft, non-tender, non-distended MUSCULOSKELETAL:  No edema; No deformity  SKIN: Warm and dry NEUROLOGIC:  Alert and oriented x 3 PSYCHIATRIC:  Normal affect    Signed, Shirlee More, MD  01/01/2019 11:23 AM    [place]

## 2019-01-01 ENCOUNTER — Telehealth (HOSPITAL_COMMUNITY): Payer: Self-pay

## 2019-01-01 ENCOUNTER — Encounter: Payer: Self-pay | Admitting: Cardiology

## 2019-01-01 ENCOUNTER — Ambulatory Visit (INDEPENDENT_AMBULATORY_CARE_PROVIDER_SITE_OTHER): Payer: Medicare Other | Admitting: Cardiology

## 2019-01-01 VITALS — BP 132/78 | HR 72 | Ht 70.0 in | Wt 189.4 lb

## 2019-01-01 DIAGNOSIS — N183 Chronic kidney disease, stage 3 unspecified: Secondary | ICD-10-CM

## 2019-01-01 DIAGNOSIS — I739 Peripheral vascular disease, unspecified: Secondary | ICD-10-CM

## 2019-01-01 DIAGNOSIS — I10 Essential (primary) hypertension: Secondary | ICD-10-CM | POA: Insufficient documentation

## 2019-01-01 DIAGNOSIS — E785 Hyperlipidemia, unspecified: Secondary | ICD-10-CM | POA: Insufficient documentation

## 2019-01-01 DIAGNOSIS — Z01810 Encounter for preprocedural cardiovascular examination: Secondary | ICD-10-CM

## 2019-01-01 HISTORY — DX: Essential (primary) hypertension: I10

## 2019-01-01 NOTE — Patient Instructions (Addendum)
Medication Instructions:  Your physician recommends that you continue on your current medications as directed. Please refer to the Current Medication list given to you today.  If you need a refill on your cardiac medications before your next appointment, please call your pharmacy.   Lab work: NONE If you have labs (blood work) drawn today and your tests are completely normal, you will receive your results only by: [name] MyChart Message (if you have MyChart) OR . A paper copy in the mail If you have any lab test that is abnormal or we need to change your treatment, we will call you to review the results.  Testing/Procedures: Your physician has requested that you have a lexiscan myoview. For further information please visit [email or web address]. Please follow instruction sheet, as given.    Follow-Up: At [facility], you and your health needs are our priority.  As part of our continuing mission to provide you with exceptional heart care, we have created designated Provider Care Teams.  These Care Teams include your primary Cardiologist (physician) and Advanced Practice Providers (APPs -  Physician Assistants and Nurse Practitioners) who all work together to provide you with the care you need, when you need it.  You will need a follow up appointment in 6 weeks.      Regadenoson injection What is this medicine? REGADENOSON is used to test the heart for coronary artery disease. It is used in patients who can not exercise for their stress test. This medicine may be used for other purposes; ask your health care provider or pharmacist if you have questions. COMMON BRAND NAME(S): Lexiscan What should I tell my health care provider before I take this medicine? They need to know if you have any of these conditions: -heart problems -lung or breathing disease, like asthma or COPD -an unusual or allergic reaction to regadenoson, other medicines, foods, dyes, or preservatives -pregnant or trying to  get pregnant -breast-feeding How should I use this medicine? This medicine is for injection into a vein. It is given by a health care professional in a hospital or clinic setting. Talk to your pediatrician regarding the use of this medicine in children. Special care may be needed. Overdosage: If you think you have taken too much of this medicine contact a poison control center or emergency room at once. NOTE: This medicine is only for you. Do not share this medicine with others. What if I miss a dose? This does not apply. What may interact with this medicine? -caffeine -dipyridamole -guarana -theophylline This list may not describe all possible interactions. Give your health care provider a list of all the medicines, herbs, non-prescription drugs, or dietary supplements you use. Also tell them if you smoke, drink alcohol, or use illegal drugs. Some items may interact with your medicine. What should I watch for while using this medicine? Your condition will be monitored carefully while you are receiving this medicine. Do not take medicines, foods, or drinks with caffeine (like coffee, tea, or colas) for at least 12 hours before your test. If you do not know if something contains caffeine, ask your health care professional. What side effects may I notice from receiving this medicine? Side effects that you should report to your doctor or health care professional as soon as possible: -allergic reactions like skin rash, itching or hives, swelling of the face, lips, or tongue -breathing problems -chest pain, tightness or palpitations -severe headache Side effects that usually do not require medical attention (report to your doctor or health  care professional if they continue or are bothersome): -flushing -headache -irritation or pain at site where injected -nausea, vomiting This list may not describe all possible side effects. Call your doctor for medical advice about side effects. You may  report side effects to FDA at 1-800-FDA-1088. Where should I keep my medicine? This drug is given in a hospital or clinic and will not be stored at home. NOTE: This sheet is a summary. It may not cover all possible information. If you have questions about this medicine, talk to your doctor, pharmacist, or health care provider.  2019 Elsevier/Gold Standard (2008-08-02 15:08:13)     Cardiac Nuclear Scan A cardiac nuclear scan is a test that is done to check the flow of blood to your heart. It is done when you are resting and when you are exercising. The test looks for problems such as:  Not enough blood reaching a portion of the heart.  The heart muscle not working as it should. You may need this test if:  You have heart disease.  You have had lab results that are not normal.  You have had heart surgery or a balloon procedure to open up blocked arteries (angioplasty).  You have chest pain.  You have shortness of breath. In this test, a special dye (tracer) is put into your bloodstream. The tracer will travel to your heart. A camera will then take pictures of your heart to see how the tracer moves through your heart. This test is usually done at a hospital and takes 2-4 hours. Tell a doctor about:  Any allergies you have.  All medicines you are taking, including vitamins, herbs, eye drops, creams, and over-the-counter medicines.  Any problems you or family members have had with anesthetic medicines.  Any blood disorders you have.  Any surgeries you have had.  Any medical conditions you have.  Whether you are pregnant or may be pregnant. What are the risks? Generally, this is a safe test. However, problems may occur, such as:  Serious chest pain and heart attack. This is only a risk if the stress portion of the test is done.  Rapid heartbeat.  A feeling of warmth in your chest. This feeling usually does not last long.  Allergic reaction to the tracer. What happens  before the test?  Ask your doctor about changing or stopping your normal medicines. This is important.  Follow instructions from your doctor about what you cannot eat or drink.  Remove your jewelry on the day of the test. What happens during the test?  An IV tube will be inserted into one of your veins.  Your doctor will give you a small amount of tracer through the IV tube.  You will wait for 20-40 minutes while the tracer moves through your bloodstream.  Your heart will be monitored with an electrocardiogram (ECG).  You will lie down on an exam table.  Pictures of your heart will be taken for about 15-20 minutes.  You may also have a stress test. For this test, one of these things may be done: ? You will be asked to exercise on a treadmill or a stationary bike. ? You will be given medicines that will make your heart work harder. This is done if you are unable to exercise.  When blood flow to your heart has peaked, a tracer will again be given through the IV tube.  After 20-40 minutes, you will get back on the exam table. More pictures will be taken of  your heart.  Depending on the tracer that is used, more pictures may need to be taken 3-4 hours later.  Your IV tube will be removed when the test is over. The test may vary among doctors and hospitals. What happens after the test?  Ask your doctor: ? Whether you can return to your normal schedule, including diet, activities, and medicines. ? Whether you should drink more fluids. This will help to remove the tracer from your body. Drink enough fluid to keep your pee (urine) pale yellow.  Ask your doctor, or the department that is doing the test: ? When will my results be ready? ? How will I get my results? Summary  A cardiac nuclear scan is a test that is done to check the flow of blood to your heart.  Tell your doctor whether you are pregnant or may be pregnant.  Before the test, ask your doctor about changing or  stopping your normal medicines. This is important.  Ask your doctor whether you can return to your normal activities. You may be asked to drink more fluids. This information is not intended to replace advice given to you by your health care provider. Make sure you discuss any questions you have with your health care provider. Document Released: 05/19/2018 Document Revised: 05/19/2018 Document Reviewed: 05/19/2018 Elsevier Interactive Patient Education  2019 [employer].

## 2019-01-01 NOTE — Telephone Encounter (Signed)
Encounter complete. 

## 2019-01-06 ENCOUNTER — Ambulatory Visit (HOSPITAL_COMMUNITY)
Admission: RE | Admit: 2019-01-06 | Discharge: 2019-01-06 | Disposition: A | Discharge status: Home / Self Care | Payer: Medicare Other | Source: Ambulatory Visit | Attending: Cardiology | Admitting: Cardiology

## 2019-01-06 DIAGNOSIS — Z01810 Encounter for preprocedural cardiovascular examination: Secondary | ICD-10-CM | POA: Diagnosis not present

## 2019-01-06 DIAGNOSIS — I10 Essential (primary) hypertension: Secondary | ICD-10-CM | POA: Diagnosis present

## 2019-01-06 LAB — MYOCARDIAL PERFUSION IMAGING
CHL CUP NUCLEAR SSS: 1
CHL CUP RESTING HR STRESS: 67 {beats}/min
Peak HR: 82 {beats}/min
SDS: 0
SRS: 1
TID: 1.04

## 2019-01-06 MED ORDER — REGADENOSON 0.4 MG/5ML IV SOLN
0.4000 mg | Freq: Once | INTRAVENOUS | Status: AC
  Administered 2019-01-06: 0.4 mg via INTRAVENOUS

## 2019-01-06 MED ORDER — TECHNETIUM TC 99M TETROFOSMIN IV KIT
31.2000 | PACK | Freq: Once | INTRAVENOUS | Status: AC | PRN
  Administered 2019-01-06: 31.2 via INTRAVENOUS
  Filled 2019-01-06: qty 32

## 2019-01-06 MED ORDER — TECHNETIUM TC 99M TETROFOSMIN IV KIT
11.0000 | PACK | Freq: Once | INTRAVENOUS | Status: AC | PRN
  Administered 2019-01-06: 11 via INTRAVENOUS
  Filled 2019-01-06: qty 11

## 2019-01-07 ENCOUNTER — Encounter: Payer: Self-pay | Admitting: Vascular Surgery

## 2019-01-09 ENCOUNTER — Encounter: Payer: Self-pay | Admitting: Vascular Surgery

## 2019-01-09 ENCOUNTER — Telehealth: Payer: Self-pay | Admitting: Vascular Surgery

## 2019-01-09 ENCOUNTER — Encounter (HOSPITAL_COMMUNITY)
Admission: RE | Disposition: A | Discharge status: Home / Self Care | Payer: Self-pay | Source: Home / Self Care | Attending: Vascular Surgery

## 2019-01-09 ENCOUNTER — Other Ambulatory Visit: Payer: Self-pay

## 2019-01-09 ENCOUNTER — Ambulatory Visit (HOSPITAL_COMMUNITY)
Admission: RE | Admit: 2019-01-09 | Discharge: 2019-01-09 | Disposition: A | Discharge status: Home / Self Care | Payer: Medicare Other | Attending: Vascular Surgery | Admitting: Vascular Surgery

## 2019-01-09 DIAGNOSIS — Z01810 Encounter for preprocedural cardiovascular examination: Secondary | ICD-10-CM | POA: Diagnosis present

## 2019-01-09 DIAGNOSIS — Z539 Procedure and treatment not carried out, unspecified reason: Secondary | ICD-10-CM | POA: Diagnosis not present

## 2019-01-09 LAB — BASIC METABOLIC PANEL
Anion gap: 9 (ref 5–15)
BUN: 28 mg/dL — ABNORMAL HIGH (ref 8–23)
CALCIUM: 8.9 mg/dL (ref 8.9–10.3)
CO2: 22 mmol/L (ref 22–32)
Chloride: 107 mmol/L (ref 98–111)
Creatinine, Ser: 2.77 mg/dL — ABNORMAL HIGH (ref 0.61–1.24)
GFR calc Af Amer: 25 mL/min — ABNORMAL LOW (ref >60)
GFR calc non Af Amer: 22 mL/min — ABNORMAL LOW (ref >60)
Glucose, Bld: 99 mg/dL (ref 70–99)
Potassium: 4 mmol/L (ref 3.5–5.1)
Sodium: 138 mmol/L (ref 135–145)

## 2019-01-09 LAB — GLUCOSE, CAPILLARY: GLUCOSE-CAPILLARY: 100 mg/dL — AB (ref 70–99)

## 2019-01-09 LAB — POCT I-STAT 4, (NA,K, GLUC, HGB,HCT)
Glucose, Bld: 101 mg/dL — ABNORMAL HIGH (ref 70–99)
HCT: 38 % — ABNORMAL LOW (ref 39.0–52.0)
Hemoglobin: 12.9 g/dL — ABNORMAL LOW (ref 13.0–17.0)
Potassium: 4 mmol/L (ref 3.5–5.1)
Sodium: 142 mmol/L (ref 135–145)

## 2019-01-09 LAB — POCT I-STAT CREATININE: Creatinine, Ser: 2.9 mg/dL — ABNORMAL HIGH (ref 0.61–1.24)

## 2019-01-09 SURGERY — ABDOMINAL AORTOGRAM W/LOWER EXTREMITY
Anesthesia: LOCAL | Laterality: Bilateral

## 2019-01-09 MED ORDER — SODIUM CHLORIDE 0.9 % IV SOLN
INTRAVENOUS | Status: DC
  Administered 2019-01-09: 07:00:00 via INTRAVENOUS

## 2019-01-09 MED ORDER — SODIUM CHLORIDE 0.9 % IV BOLUS
1000.0000 mL | Freq: Once | INTRAVENOUS | Status: AC
  Administered 2019-01-09: 1000 mL via INTRAVENOUS

## 2019-01-09 NOTE — Telephone Encounter (Signed)
sch appt spk to pt 01/22/2019 345pm f/u MD

## 2019-01-09 NOTE — Telephone Encounter (Signed)
-----   Message from [name], MD sent at 01/09/2019  9:15 AM EST ----- Pt was cancelled today.  He had an elevated creatinine of almost 3.  He apparently has an appt with [facility] in a few weeks.  Can you contact him later today and schedule an appt with me to talk more after his nephrology appt.  Ruta Hinds

## 2019-01-09 NOTE — Progress Notes (Signed)
Patient's lower extremity arteriogram was canceled today.  His creatinine was 2.9.  This is significantly elevated from a previous creatinine of 1.9 on his last visit with [facility] on December 03, 2018.  Since this is an elective procedure being done for lifestyle limiting claudication and most likely for a redo femoropopliteal bypass we will await further evaluation from [facility] on his overall risk and whether or not the patient would wish to proceed with the possibility of worsening kidney function with exposure to contrast or anesthesia and operation.  We will schedule an appointment for the patient to follow-up with me after his appointment with [facility].  He thinks this is in the next few weeks.  Ruta Hinds, MD [facility] Office: [phone] Pager: [phone]

## 2019-01-09 NOTE — Progress Notes (Signed)
Patient's arteriogram was canceled today due to elevated creatinine of 2.9.  This is significantly up from 1.8 in mid December when he was seen by [facility].  The patient has a appointment scheduled with [facility] in a couple of weeks.  We will schedule a follow-up appointment with him to discuss risk of contrast nephropathy after that appointment and see whether or not he wishes to still proceed with contrast administration and possible redo femoropopliteal bypass both of which would have some risk of renal failure associated with them.  Ruta Hinds, MD [facility] [place] Office: [phone] Pager: [phone]

## 2019-01-22 ENCOUNTER — Other Ambulatory Visit: Payer: Self-pay

## 2019-01-22 ENCOUNTER — Ambulatory Visit: Payer: Medicare Other | Admitting: Vascular Surgery

## 2019-01-22 ENCOUNTER — Encounter: Payer: Self-pay | Admitting: Vascular Surgery

## 2019-02-15 NOTE — Progress Notes (Signed)
Cardiology Office Note:    Date:  02/16/2019   ID:  Joseph Hebert., DOB Feb 27, 1946, MRN [id number]  PCP:  [name], MD  Cardiologist:  Shirlee More, MD    Referring MD: [name], MD    ASSESSMENT:    1. Essential hypertension   2. Hyperlipidemia, unspecified hyperlipidemia type   3. CKD (chronic kidney disease) stage 3, GFR 30-59 ml/min (HCC)    PLAN:    In order of problems listed above:  1. Stable blood pressure target continue current treatment no longer is on furosemide of worsening renal function and will continue his calcium channel blocker 2. Stable continue statin 3. Managed by nephrology labs were performed in the last week and he is still pending angiography.   Next appointment: as needed   Medication Adjustments/Labs and Tests Ordered: Current medicines are reviewed at length with the patient today.  Concerns regarding medicines are outlined above.  No orders of the defined types were placed in this encounter.  No orders of the defined types were placed in this encounter.   Chief Complaint  Patient presents with  . Follow-up    after MPI  . Chronic Kidney Disease    worsened  . PAD    History of Present Illness:    Joseph Hebert. is a 73 y.o. male with a hx of PAD, dyslipidemia T2 DM, hypertension and CKD. [name] He had L femoral-popliteal bypass 2016 and left CEA 2016.  For preoperative risk assessment he underwent a stress myocardial perfusion study 01/06/1959 with no ischemia.  Angiography is on hold with worsening renal function creatinine 2.9.  Compliance with diet, lifestyle and medications: yes  Still awaiting angiography he thinks his kidney functions improved and his opinion was due to furosemide.  No chest pain edema palpitation syncope orthopnea.  I reviewed his myocardial perfusion study normal at low risk and requires no further cardiac optimization prior to elective angiography or vascular surgery Past Medical History:   Diagnosis Date  . Acute encephalopathy   . Acute hypoxemic respiratory failure ([place])   . Acute pulmonary edema (HCC)   . Acute respiratory failure with hypoxia ([place])   . Aftercare following surgery of the circulatory system, [place] 01/01/2014  . Anemia   . Arrhythmia    takes metoprolol daily  . Arthritis   . Asthma   . Bilateral leg pain 06/16/2015  . CAD (coronary artery disease)   . Cardiac asystole ([place])   . Cardiogenic shock ([place])   . Carotid artery occlusion    with Claudication  . Chronic back pain    stenosis  . Chronic kidney disease   . CKD (chronic kidney disease) stage 3, GFR 30-59 ml/min (HCC) 07/18/2015  . COPD (chronic obstructive pulmonary disease) (HCC)    Spirva daily and Albuterol as needed  . Dementia ([place])   . Depression   . Diabetes mellitus without complication (HCC)    Type 2 diet controlled.Never been on meds  . Gout    takes Uloric daily  . HCAP (healthcare-associated pneumonia)   . Head injury    as a child  . History of colon polyps    benign  . History of gastric ulcer   . History of hiatal hernia   . Hyperlipidemia    takes Atorvastatin and Fenofibrate daily  . Hypertension    takes Lisinopril,Amlodipine,and Hydralazine daily  . Impaired memory    takes Namenda daily  . Intermittent claudication ([place]) 07/18/2015  Overview:  Operative management: The patient will be scheduled for a left femoral to  popliteal bypass in the near future after cardiac risk stratification. He  does have some superficial femoral and tibial artery occlusive disease in  the right leg. However his ABI on the right side is greater than 0.9.  Hopefully he will improve with just a walking program the right leg alone.  If not we could c  . Lumbosacral spondylosis with radiculopathy 02/17/2016  . Nevus of choroid of left eye 12/08/2014  . Numbness    lower left leg and upper right thigh  . Occlusion and stenosis of carotid artery without mention of cerebral infarction 01/01/2013    . Pneumonia    hx of-couple of yrs ago  . Preoperative cardiovascular examination 07/17/2015   Overview:  Echo 2013 with EF 55-60% Lexiscan MPS 11/12/12 with normal perfusion and function  Overview:  Echo 2013 with EF 55-60% Lexiscan MPS 11/12/12 with normal perfusion and function  . Serous retinal detachment of left eye 12/08/2014  . Shortness of breath dyspnea    with exertion  . Status post intraocular lens implant 12/08/2014  . Tuberculosis 2006    9 months   . Vertigo     Past Surgical History:  Procedure Laterality Date  . ABDOMINAL EXPOSURE N/A 01/22/2017   Procedure: ABDOMINAL EXPOSURE;  Surgeon: [name], MD;  Location: [facility];  Service: Vascular;  Laterality: N/A;  . ANTERIOR LAT LUMBAR FUSION N/A 01/22/2017   Procedure: LUMBAR THREE-FOUR, LUMBAR FOUR-FIVE ANTERIOR LATERAL INTERBODY FUSION;  Surgeon: [name], MD;  Location: [facility];  Service: Neurosurgery;  Laterality: N/A;  L3-4 L4-5 Lateral interbody fusion  . ANTERIOR LUMBAR FUSION N/A 01/22/2017   Procedure: LUMBAR FIVE-SACRUM ONE ANTERIOR LUMBAR INTERBODY FUSION WITH ABDOMINAL APPROACH BY DR [name];  Surgeon: [name], MD;  Location: [facility];  Service: Neurosurgery;  Laterality: N/A;  . CAROTID BODY TUMOR EXCISION Left 09/21/2015   Procedure: EXCISE LEFT NECK NODULE WITH LOCAL ;  Surgeon: [name], MD;  Location: [facility];  Service: Vascular;  Laterality: Left;  . CAROTID ENDARTERECTOMY  Nov. 15,2011   LEFT cea  . cataract surgery Bilateral   . COLONOSCOPY    . EYE SURGERY    . FEMORAL-POPLITEAL BYPASS GRAFT Left 08/23/2015   Procedure: LEFT FEMORAL-POPLITEAL ARTERY BYPASS WITH GORETEX GRAFT;  Surgeon: [name], MD;  Location: [facility];  Service: Vascular;  Laterality: Left;  . JOINT REPLACEMENT  1980   RIGHT  knee  . LUMBAR EPIDURAL INJECTION    . LUMBAR LAMINECTOMY/DECOMPRESSION MICRODISCECTOMY Right 02/17/2016   Procedure: Laminectomy and Foraminotomy - Lumbar four-five right;   Surgeon: [name], MD;  Location: [facility];  Service: Neurosurgery;  Laterality: Right;  right  . LUMBAR PERCUTANEOUS PEDICLE SCREW 3 LEVEL N/A 01/22/2017   Procedure: LUMBAR THREE-SACRAL ONE PERCUTANEOUS PEDICLE SCREW FIXATION WITH ROBOTIC ASSISTANCE;  Surgeon: [name], MD;  Location: [facility];  Service: Neurosurgery;  Laterality: N/A;  L3 to S1 Percutaneous pedicle screw fixation  . PERIPHERAL VASCULAR CATHETERIZATION N/A 06/24/2015   Procedure: Abdominal Aortogram;  Surgeon: [name], MD;  Location: [facility];  Service: Cardiovascular;  Laterality: N/A;  . Stents  Aug.  23, 1999   Bilateral iliofemoral  stents, [place].  . TONSILLECTOMY      Current Medications: Current Meds  Medication Sig  . amLODipine (NORVASC) 10 MG tablet Take 10 mg by mouth daily.  [name]  aspirin EC 81 MG tablet Take 81 mg by mouth daily.  [name] atorvastatin (LIPITOR) 80 MG tablet Take 80 mg by mouth at bedtime.   . Cholecalciferol (VITAMIN D3) 50 MCG (2000 UT) capsule Take 2,000 Units by mouth daily.  [name] escitalopram (LEXAPRO) 10 MG tablet Take 10 mg by mouth daily.  . ferrous sulfate 325 (65 FE) MG tablet Take 325 mg by mouth daily.   . furosemide (LASIX) 20 MG tablet Take 20 mg by mouth daily.   [name] lidocaine (LIDODERM) 5 % APPLY 1 3 PATCHES ACROSS LOWER BACK FOR 12 HOURS ON REMOVE AFTER 12 HOURS AND REPEAT CYCLE ONCE DAILY AS NEEDED.  . memantine (NAMENDA) 10 MG tablet Take 10 mg by mouth daily.  . pantoprazole (PROTONIX) 40 MG tablet Take 40 mg by mouth daily.  [name] ULORIC 40 MG tablet Take 40 mg by mouth daily.      Allergies:   Lopressor [metoprolol tartrate]   Social History   Socioeconomic History  . Marital status: Married    Spouse name: Not on file  . Number of children: Not on file  . Years of education: Not on file  . Highest education level: Not on file  Occupational History  . Not on file  Social Needs  . Financial resource strain: Not on file  . Food  insecurity:    Worry: Not on file    Inability: Not on file  . Transportation needs:    Medical: Not on file    Non-medical: Not on file  Tobacco Use  . Smoking status: Current Every Day Smoker    Packs/day: 0.30    Types: Cigarettes  . Smokeless tobacco: Never Used  Substance and Sexual Activity  . Alcohol use: No    Alcohol/week: 0.0 standard drinks  . Drug use: No  . Sexual activity: Yes  Lifestyle  . Physical activity:    Days per week: Not on file    Minutes per session: Not on file  . Stress: Not on file  Relationships  . Social connections:    Talks on phone: Not on file    Gets together: Not on file    Attends religious service: Not on file    Active member of club or organization: Not on file    Attends meetings of clubs or organizations: Not on file    Relationship status: Not on file  Other Topics Concern  . Not on file  Social History Narrative  . Not on file     Family History: The patient's family history includes Diabetes in his mother; Heart disease in his sister; Hyperlipidemia in his father; Hypertension in his father; Other in his father. ROS:   Please see the history of present illness.    All other systems reviewed and are negative.  EKGs/Labs/Other Studies Reviewed:    The following studies were reviewed today:  Recent Labs: 01/09/2019: BUN 28; Creatinine, Ser 2.77; Hemoglobin 12.9; Potassium 4.0; Sodium 138  Recent Lipid Panel No results found for: CHOL, TRIG, HDL, CHOLHDL, VLDL, LDLCALC, LDLDIRECT  Physical Exam:    VS:  BP 136/70 (BP Location: Left Arm, Patient Position: Sitting, Cuff Size: Normal)   Pulse 91   Ht 5\' 10"  (1.778 m)   Wt 192 lb (87.1 kg)   SpO2 97%   BMI 27.55 kg/m     Wt Readings from Last 3 Encounters:  02/16/19 192 lb (87.1 kg)  01/22/19 196 lb (88.9 kg)  01/09/19 187 lb (84.8 kg)  GEN:  Well nourished, well developed in no acute distress HEENT: Normal NECK: No JVD; No carotid bruits LYMPHATICS: No  lymphadenopathy CARDIAC: RRR, no murmurs, rubs, gallops RESPIRATORY:  Clear to auscultation without rales, wheezing or rhonchi  ABDOMEN: Soft, non-tender, non-distended MUSCULOSKELETAL:  No edema; No deformity  SKIN: Warm and dry NEUROLOGIC:  Alert and oriented x 3 PSYCHIATRIC:  Normal affect    Signed, Shirlee More, MD  02/16/2019 1:42 PM    [facility]

## 2019-02-16 ENCOUNTER — Encounter: Payer: Self-pay | Admitting: Cardiology

## 2019-02-16 ENCOUNTER — Ambulatory Visit (INDEPENDENT_AMBULATORY_CARE_PROVIDER_SITE_OTHER): Payer: Medicare Other | Admitting: Cardiology

## 2019-02-16 VITALS — BP 136/70 | HR 91 | Ht 70.0 in | Wt 192.0 lb

## 2019-02-16 DIAGNOSIS — N183 Chronic kidney disease, stage 3 unspecified: Secondary | ICD-10-CM

## 2019-02-16 DIAGNOSIS — E785 Hyperlipidemia, unspecified: Secondary | ICD-10-CM

## 2019-02-16 DIAGNOSIS — I10 Essential (primary) hypertension: Secondary | ICD-10-CM | POA: Diagnosis not present

## 2019-02-16 NOTE — Patient Instructions (Signed)
Medication Instructions:  Your physician recommends that you continue on your current medications as directed. Please refer to the Current Medication list given to you today.  If you need a refill on your cardiac medications before your next appointment, please call your pharmacy.   Lab work: None  If you have labs (blood work) drawn today and your tests are completely normal, you will receive your results only by: . MyChart Message (if you have MyChart) OR . A paper copy in the mail If you have any lab test that is abnormal or we need to change your treatment, we will call you to review the results.  Testing/Procedures: None   Follow-Up: At [facility], you and your health needs are our priority.  As part of our continuing mission to provide you with exceptional heart care, we have created designated Provider Care Teams.  These Care Teams include your primary Cardiologist (physician) and Advanced Practice Providers (APPs -  Physician Assistants and Nurse Practitioners) who all work together to provide you with the care you need, when you need it. You will need a follow up appointment as needed if symptoms worsen or fail to improve.    

## 2019-02-19 ENCOUNTER — Other Ambulatory Visit: Payer: Self-pay | Admitting: *Deleted

## 2019-02-19 ENCOUNTER — Encounter: Payer: Self-pay | Admitting: Vascular Surgery

## 2019-02-19 ENCOUNTER — Other Ambulatory Visit: Payer: Self-pay

## 2019-02-19 ENCOUNTER — Ambulatory Visit (INDEPENDENT_AMBULATORY_CARE_PROVIDER_SITE_OTHER): Payer: Medicare Other | Admitting: Vascular Surgery

## 2019-02-19 ENCOUNTER — Encounter: Payer: Self-pay | Admitting: *Deleted

## 2019-02-19 VITALS — BP 134/81 | HR 67 | Temp 97.4°F | Resp 18 | Ht 70.0 in | Wt 192.8 lb

## 2019-02-19 DIAGNOSIS — I739 Peripheral vascular disease, unspecified: Secondary | ICD-10-CM

## 2019-02-19 NOTE — Progress Notes (Signed)
Patient is a 73 year old male who returns for follow-up today.  He previously underwent left carotid endarterectomy September 2016.  He also previous had a left femoral to above-knee popliteal bypass with propatent in 2016.  He has had slow decline with worsening claudication symptoms in his left leg.  He has a chronically occluded left leg bypass.  Unfortunately he continues to smoke.  He also has an element of renal dysfunction with a baseline creatinine greater than 2.  He was recently seen by his nephrologist and though there would be some risk of contrast nephropathy it was felt that the symptoms in his left leg were fairly debilitating and that we should proceed with contrast angiogram although recognizing there would be some risk of contrast nephropathy.  The patient denies rest pain.  He has no nonhealing wounds on the foot.  He states his right leg does not really have problems currently.  He is on a statin and aspirin.  I have held previous discussions with him regarding limited durability of a bypass if he continues to smoke.  Other chronic medical problems include coronary artery disease chronic back pain COPD diabetes all of which have been stable.  Current Outpatient Medications on File Prior to Visit  Medication Sig Dispense Refill  . amLODipine (NORVASC) 10 MG tablet Take 10 mg by mouth daily.    [name] aspirin EC 81 MG tablet Take 81 mg by mouth daily.    [name] atorvastatin (LIPITOR) 80 MG tablet Take 80 mg by mouth at bedtime.     . Cholecalciferol (VITAMIN D3) 50 MCG (2000 UT) capsule Take 2,000 Units by mouth daily.    [name] escitalopram (LEXAPRO) 10 MG tablet Take 10 mg by mouth daily.    . ferrous sulfate 325 (65 FE) MG tablet Take 325 mg by mouth daily.     [name] lidocaine (LIDODERM) 5 % APPLY 1 3 PATCHES ACROSS LOWER BACK FOR 12 HOURS ON REMOVE AFTER 12 HOURS AND REPEAT CYCLE ONCE DAILY AS NEEDED.    . memantine (NAMENDA) 10 MG tablet Take 10 mg by mouth daily.    . pantoprazole (PROTONIX) 40 MG  tablet Take 40 mg by mouth daily.    [name] ULORIC 40 MG tablet Take 40 mg by mouth daily.   5   No current facility-administered medications on file prior to visit.    Past Medical History:  Diagnosis Date  . Acute encephalopathy   . Acute hypoxemic respiratory failure ([place])   . Acute pulmonary edema (HCC)   . Acute respiratory failure with hypoxia ([place])   . Aftercare following surgery of the circulatory system, [place] 01/01/2014  . Anemia   . Arrhythmia    takes metoprolol daily  . Arthritis   . Asthma   . Bilateral leg pain 06/16/2015  . CAD (coronary artery disease)   . Cardiac asystole ([place])   . Cardiogenic shock ([place])   . Carotid artery occlusion    with Claudication  . Chronic back pain    stenosis  . Chronic kidney disease   . CKD (chronic kidney disease) stage 3, GFR 30-59 ml/min (HCC) 07/18/2015  . COPD (chronic obstructive pulmonary disease) (HCC)    Spirva daily and Albuterol as needed  . Dementia ([place])   . Depression   . Diabetes mellitus without complication (HCC)    Type 2 diet controlled.Never been on meds  . Gout    takes Uloric daily  . HCAP (healthcare-associated pneumonia)   . Head injury  as a child  . History of colon polyps    benign  . History of gastric ulcer   . History of hiatal hernia   . Hyperlipidemia    takes Atorvastatin and Fenofibrate daily  . Hypertension    takes Lisinopril,Amlodipine,and Hydralazine daily  . Impaired memory    takes Namenda daily  . Intermittent claudication ([place]) 07/18/2015   Overview:  Operative management: The patient will be scheduled for a left femoral to  popliteal bypass in the near future after cardiac risk stratification. He  does have some superficial femoral and tibial artery occlusive disease in  the right leg. However his ABI on the right side is greater than 0.9.  Hopefully he will improve with just a walking program the right leg alone.  If not we could c  . Lumbosacral spondylosis with radiculopathy 02/17/2016   . Nevus of choroid of left eye 12/08/2014  . Numbness    lower left leg and upper right thigh  . Occlusion and stenosis of carotid artery without mention of cerebral infarction 01/01/2013  . Pneumonia    hx of-couple of yrs ago  . Preoperative cardiovascular examination 07/17/2015   Overview:  Echo 2013 with EF 55-60% Lexiscan MPS 11/12/12 with normal perfusion and function  Overview:  Echo 2013 with EF 55-60% Lexiscan MPS 11/12/12 with normal perfusion and function  . Serous retinal detachment of left eye 12/08/2014  . Shortness of breath dyspnea    with exertion  . Status post intraocular lens implant 12/08/2014  . Tuberculosis 2006    9 months   . Vertigo     Past Surgical History:  Procedure Laterality Date  . ABDOMINAL EXPOSURE N/A 01/22/2017   Procedure: ABDOMINAL EXPOSURE;  Surgeon: [name], MD;  Location: [facility];  Service: Vascular;  Laterality: N/A;  . ANTERIOR LAT LUMBAR FUSION N/A 01/22/2017   Procedure: LUMBAR THREE-FOUR, LUMBAR FOUR-FIVE ANTERIOR LATERAL INTERBODY FUSION;  Surgeon: [name], MD;  Location: [facility];  Service: Neurosurgery;  Laterality: N/A;  L3-4 L4-5 Lateral interbody fusion  . ANTERIOR LUMBAR FUSION N/A 01/22/2017   Procedure: LUMBAR FIVE-SACRUM ONE ANTERIOR LUMBAR INTERBODY FUSION WITH ABDOMINAL APPROACH BY DR [name];  Surgeon: [name], MD;  Location: [facility];  Service: Neurosurgery;  Laterality: N/A;  . CAROTID BODY TUMOR EXCISION Left 09/21/2015   Procedure: EXCISE LEFT NECK NODULE WITH LOCAL ;  Surgeon: [name], MD;  Location: [facility];  Service: Vascular;  Laterality: Left;  . CAROTID ENDARTERECTOMY  Nov. 15,2011   LEFT cea  . cataract surgery Bilateral   . COLONOSCOPY    . EYE SURGERY    . FEMORAL-POPLITEAL BYPASS GRAFT Left 08/23/2015   Procedure: LEFT FEMORAL-POPLITEAL ARTERY BYPASS WITH GORETEX GRAFT;  Surgeon: [name], MD;  Location: [facility];  Service: Vascular;  Laterality: Left;  . JOINT REPLACEMENT   1980   RIGHT  knee  . LUMBAR EPIDURAL INJECTION    . LUMBAR LAMINECTOMY/DECOMPRESSION MICRODISCECTOMY Right 02/17/2016   Procedure: Laminectomy and Foraminotomy - Lumbar four-five right;  Surgeon: [name], MD;  Location: [place] [facility];  Service: Neurosurgery;  Laterality: Right;  right  . LUMBAR PERCUTANEOUS PEDICLE SCREW 3 LEVEL N/A 01/22/2017   Procedure: LUMBAR THREE-SACRAL ONE PERCUTANEOUS PEDICLE SCREW FIXATION WITH ROBOTIC ASSISTANCE;  Surgeon: [name], MD;  Location: [place];  Service: Neurosurgery;  Laterality: N/A;  L3 to S1 Percutaneous pedicle screw fixation  . PERIPHERAL VASCULAR CATHETERIZATION N/A 06/24/2015   Procedure: Abdominal  Aortogram;  Surgeon: [name], MD;  Location: [facility] [facility];  Service: Cardiovascular;  Laterality: N/A;  . Stents  Aug.  23, 1999   Bilateral iliofemoral  stents, [place].  . TONSILLECTOMY      Allergies  Allergen Reactions  . Lopressor [Metoprolol Tartrate] Other (See Comments)    Severe bradycardia    Review of systems: He denies shortness of breath.  He denies chest pain.  Physical exam:  Vitals:   02/19/19 1137  BP: 134/81  Pulse: 67  Resp: 18  Temp: (!) 97.4 F (36.3 C)  SpO2: 97%  Weight: 192 lb 12.7 oz (87.5 kg)  Height: 5\' 10"  (1.778 m)   General: Alert and oriented no acute distress  HEENT: Normal  Neck: No carotid bruit  Chest: Clear to auscultation bilaterally  Cardiac: Regular rate and rhythm without murmur  Abdomen: Soft nontender nondistended no mass  Skin: No open ulcer or rash  Extremities: 2+ radial brachial femoral absent dorsalis pedis posterior tibial pulses bilaterally  Data: Patient had bilateral ABIs performed #2019 which were 0.6 on the left 0.9 on the right  Assessment: Lifestyle limiting claudication left leg.  Patient wishes to pursue redo revascularization.  Risk benefits possible complications of procedure details of arteriogram followed by a left leg bypass were  all described to the patient today.  These include but are not limited to bleeding infection possible limb loss possible contrast nephropathy causing permanent dialysis.  He understands and agrees to proceed.  Plan: Abdominal aortogram lower extremity runoff scheduled for tomorrow February 20 2019.  We will try to limit contrast and use carbon dioxide contrast where possible.  He is then scheduled for a redo left leg bypass on Monday, February 23, 2019.  Ruta Hinds, MD [facility] Office: [phone] Pager: [phone]

## 2019-02-19 NOTE — H&P (View-Only) (Signed)
Patient is a 73 year old male who returns for follow-up today.  He previously underwent left carotid endarterectomy September 2016.  He also previous had a left femoral to above-knee popliteal bypass with propatent in 2016.  He has had slow decline with worsening claudication symptoms in his left leg.  He has a chronically occluded left leg bypass.  Unfortunately he continues to smoke.  He also has an element of renal dysfunction with a baseline creatinine greater than 2.  He was recently seen by his nephrologist and though there would be some risk of contrast nephropathy it was felt that the symptoms in his left leg were fairly debilitating and that we should proceed with contrast angiogram although recognizing there would be some risk of contrast nephropathy.  The patient denies rest pain.  He has no nonhealing wounds on the foot.  He states his right leg does not really have problems currently.  He is on a statin and aspirin.  I have held previous discussions with him regarding limited durability of a bypass if he continues to smoke.  Other chronic medical problems include coronary artery disease chronic back pain COPD diabetes all of which have been stable.  Current Outpatient Medications on File Prior to Visit  Medication Sig Dispense Refill  . amLODipine (NORVASC) 10 MG tablet Take 10 mg by mouth daily.    [name] aspirin EC 81 MG tablet Take 81 mg by mouth daily.    [name] atorvastatin (LIPITOR) 80 MG tablet Take 80 mg by mouth at bedtime.     . Cholecalciferol (VITAMIN D3) 50 MCG (2000 UT) capsule Take 2,000 Units by mouth daily.    [name] escitalopram (LEXAPRO) 10 MG tablet Take 10 mg by mouth daily.    . ferrous sulfate 325 (65 FE) MG tablet Take 325 mg by mouth daily.     [name] lidocaine (LIDODERM) 5 % APPLY 1 3 PATCHES ACROSS LOWER BACK FOR 12 HOURS ON REMOVE AFTER 12 HOURS AND REPEAT CYCLE ONCE DAILY AS NEEDED.    . memantine (NAMENDA) 10 MG tablet Take 10 mg by mouth daily.    . pantoprazole (PROTONIX) 40 MG  tablet Take 40 mg by mouth daily.    [name] ULORIC 40 MG tablet Take 40 mg by mouth daily.   5   No current facility-administered medications on file prior to visit.    Past Medical History:  Diagnosis Date  . Acute encephalopathy   . Acute hypoxemic respiratory failure ([place])   . Acute pulmonary edema (HCC)   . Acute respiratory failure with hypoxia ([place])   . Aftercare following surgery of the circulatory system, [place] 01/01/2014  . Anemia   . Arrhythmia    takes metoprolol daily  . Arthritis   . Asthma   . Bilateral leg pain 06/16/2015  . CAD (coronary artery disease)   . Cardiac asystole ([place])   . Cardiogenic shock ([place])   . Carotid artery occlusion    with Claudication  . Chronic back pain    stenosis  . Chronic kidney disease   . CKD (chronic kidney disease) stage 3, GFR 30-59 ml/min (HCC) 07/18/2015  . COPD (chronic obstructive pulmonary disease) (HCC)    Spirva daily and Albuterol as needed  . Dementia ([place])   . Depression   . Diabetes mellitus without complication (HCC)    Type 2 diet controlled.Never been on meds  . Gout    takes Uloric daily  . HCAP (healthcare-associated pneumonia)   . Head injury  as a child  . History of colon polyps    benign  . History of gastric ulcer   . History of hiatal hernia   . Hyperlipidemia    takes Atorvastatin and Fenofibrate daily  . Hypertension    takes Lisinopril,Amlodipine,and Hydralazine daily  . Impaired memory    takes Namenda daily  . Intermittent claudication ([place]) 07/18/2015   Overview:  Operative management: The patient will be scheduled for a left femoral to  popliteal bypass in the near future after cardiac risk stratification. He  does have some superficial femoral and tibial artery occlusive disease in  the right leg. However his ABI on the right side is greater than 0.9.  Hopefully he will improve with just a walking program the right leg alone.  If not we could c  . Lumbosacral spondylosis with radiculopathy 02/17/2016   . Nevus of choroid of left eye 12/08/2014  . Numbness    lower left leg and upper right thigh  . Occlusion and stenosis of carotid artery without mention of cerebral infarction 01/01/2013  . Pneumonia    hx of-couple of yrs ago  . Preoperative cardiovascular examination 07/17/2015   Overview:  Echo 2013 with EF 55-60% Lexiscan MPS 11/12/12 with normal perfusion and function  Overview:  Echo 2013 with EF 55-60% Lexiscan MPS 11/12/12 with normal perfusion and function  . Serous retinal detachment of left eye 12/08/2014  . Shortness of breath dyspnea    with exertion  . Status post intraocular lens implant 12/08/2014  . Tuberculosis 2006    9 months   . Vertigo     Past Surgical History:  Procedure Laterality Date  . ABDOMINAL EXPOSURE N/A 01/22/2017   Procedure: ABDOMINAL EXPOSURE;  Surgeon: [name], MD;  Location: [place];  Service: Vascular;  Laterality: N/A;  . ANTERIOR LAT LUMBAR FUSION N/A 01/22/2017   Procedure: LUMBAR THREE-FOUR, LUMBAR FOUR-FIVE ANTERIOR LATERAL INTERBODY FUSION;  Surgeon: [name], MD;  Location: [place] [facility];  Service: Neurosurgery;  Laterality: N/A;  L3-4 L4-5 Lateral interbody fusion  . ANTERIOR LUMBAR FUSION N/A 01/22/2017   Procedure: LUMBAR FIVE-SACRUM ONE ANTERIOR LUMBAR INTERBODY FUSION WITH ABDOMINAL APPROACH BY DR [name];  Surgeon: [name], MD;  Location: [facility];  Service: Neurosurgery;  Laterality: N/A;  . CAROTID BODY TUMOR EXCISION Left 09/21/2015   Procedure: EXCISE LEFT NECK NODULE WITH LOCAL ;  Surgeon: [name], MD;  Location: [place];  Service: Vascular;  Laterality: Left;  . CAROTID ENDARTERECTOMY  Nov. 15,2011   LEFT cea  . cataract surgery Bilateral   . COLONOSCOPY    . EYE SURGERY    . FEMORAL-POPLITEAL BYPASS GRAFT Left 08/23/2015   Procedure: LEFT FEMORAL-POPLITEAL ARTERY BYPASS WITH GORETEX GRAFT;  Surgeon: [name], MD;  Location: [place];  Service: Vascular;  Laterality: Left;  . JOINT REPLACEMENT   1980   RIGHT  knee  . LUMBAR EPIDURAL INJECTION    . LUMBAR LAMINECTOMY/DECOMPRESSION MICRODISCECTOMY Right 02/17/2016   Procedure: Laminectomy and Foraminotomy - Lumbar four-five right;  Surgeon: [name], MD;  Location: [place] [facility];  Service: Neurosurgery;  Laterality: Right;  right  . LUMBAR PERCUTANEOUS PEDICLE SCREW 3 LEVEL N/A 01/22/2017   Procedure: LUMBAR THREE-SACRAL ONE PERCUTANEOUS PEDICLE SCREW FIXATION WITH ROBOTIC ASSISTANCE;  Surgeon: [name], MD;  Location: [facility];  Service: Neurosurgery;  Laterality: N/A;  L3 to S1 Percutaneous pedicle screw fixation  . PERIPHERAL VASCULAR CATHETERIZATION N/A 06/24/2015   Procedure: Abdominal  Aortogram;  Surgeon: [name], MD;  Location: [place] [facility];  Service: Cardiovascular;  Laterality: N/A;  . Stents  Aug.  23, 1999   Bilateral iliofemoral  stents, [place].  . TONSILLECTOMY      Allergies  Allergen Reactions  . Lopressor [Metoprolol Tartrate] Other (See Comments)    Severe bradycardia    Review of systems: He denies shortness of breath.  He denies chest pain.  Physical exam:  Vitals:   02/19/19 1137  BP: 134/81  Pulse: 67  Resp: 18  Temp: (!) 97.4 F (36.3 C)  SpO2: 97%  Weight: 192 lb 12.7 oz (87.5 kg)  Height: 5\' 10"  (1.778 m)   General: Alert and oriented no acute distress  HEENT: Normal  Neck: No carotid bruit  Chest: Clear to auscultation bilaterally  Cardiac: Regular rate and rhythm without murmur  Abdomen: Soft nontender nondistended no mass  Skin: No open ulcer or rash  Extremities: 2+ radial brachial femoral absent dorsalis pedis posterior tibial pulses bilaterally  Data: Patient had bilateral ABIs performed #2019 which were 0.6 on the left 0.9 on the right  Assessment: Lifestyle limiting claudication left leg.  Patient wishes to pursue redo revascularization.  Risk benefits possible complications of procedure details of arteriogram followed by a left leg bypass were  all described to the patient today.  These include but are not limited to bleeding infection possible limb loss possible contrast nephropathy causing permanent dialysis.  He understands and agrees to proceed.  Plan: Abdominal aortogram lower extremity runoff scheduled for tomorrow February 20 2019.  We will try to limit contrast and use carbon dioxide contrast where possible.  He is then scheduled for a redo left leg bypass on Monday, February 23, 2019.  Ruta Hinds, MD [facility] [place] Office: [phone] Pager: [phone]

## 2019-02-19 NOTE — H&P (View-Only) (Signed)
Patient is a 73 year old male who returns for follow-up today.  He previously underwent left carotid endarterectomy September 2016.  He also previous had a left femoral to above-knee popliteal bypass with propatent in 2016.  He has had slow decline with worsening claudication symptoms in his left leg.  He has a chronically occluded left leg bypass.  Unfortunately he continues to smoke.  He also has an element of renal dysfunction with a baseline creatinine greater than 2.  He was recently seen by his nephrologist and though there would be some risk of contrast nephropathy it was felt that the symptoms in his left leg were fairly debilitating and that we should proceed with contrast angiogram although recognizing there would be some risk of contrast nephropathy.  The patient denies rest pain.  He has no nonhealing wounds on the foot.  He states his right leg does not really have problems currently.  He is on a statin and aspirin.  I have held previous discussions with him regarding limited durability of a bypass if he continues to smoke.  Other chronic medical problems include coronary artery disease chronic back pain COPD diabetes all of which have been stable.  Current Outpatient Medications on File Prior to Visit  Medication Sig Dispense Refill  . amLODipine (NORVASC) 10 MG tablet Take 10 mg by mouth daily.    [name] aspirin EC 81 MG tablet Take 81 mg by mouth daily.    [name] atorvastatin (LIPITOR) 80 MG tablet Take 80 mg by mouth at bedtime.     . Cholecalciferol (VITAMIN D3) 50 MCG (2000 UT) capsule Take 2,000 Units by mouth daily.    [name] escitalopram (LEXAPRO) 10 MG tablet Take 10 mg by mouth daily.    . ferrous sulfate 325 (65 FE) MG tablet Take 325 mg by mouth daily.     [name] lidocaine (LIDODERM) 5 % APPLY 1 3 PATCHES ACROSS LOWER BACK FOR 12 HOURS ON REMOVE AFTER 12 HOURS AND REPEAT CYCLE ONCE DAILY AS NEEDED.    . memantine (NAMENDA) 10 MG tablet Take 10 mg by mouth daily.    . pantoprazole (PROTONIX) 40 MG  tablet Take 40 mg by mouth daily.    [name] ULORIC 40 MG tablet Take 40 mg by mouth daily.   5   No current facility-administered medications on file prior to visit.    Past Medical History:  Diagnosis Date  . Acute encephalopathy   . Acute hypoxemic respiratory failure ([place])   . Acute pulmonary edema (HCC)   . Acute respiratory failure with hypoxia ([place])   . Aftercare following surgery of the circulatory system, [place] 01/01/2014  . Anemia   . Arrhythmia    takes metoprolol daily  . Arthritis   . Asthma   . Bilateral leg pain 06/16/2015  . CAD (coronary artery disease)   . Cardiac asystole ([place])   . Cardiogenic shock ([place])   . Carotid artery occlusion    with Claudication  . Chronic back pain    stenosis  . Chronic kidney disease   . CKD (chronic kidney disease) stage 3, GFR 30-59 ml/min (HCC) 07/18/2015  . COPD (chronic obstructive pulmonary disease) (HCC)    Spirva daily and Albuterol as needed  . Dementia ([place])   . Depression   . Diabetes mellitus without complication (HCC)    Type 2 diet controlled.Never been on meds  . Gout    takes Uloric daily  . HCAP (healthcare-associated pneumonia)   . Head injury  as a child  . History of colon polyps    benign  . History of gastric ulcer   . History of hiatal hernia   . Hyperlipidemia    takes Atorvastatin and Fenofibrate daily  . Hypertension    takes Lisinopril,Amlodipine,and Hydralazine daily  . Impaired memory    takes Namenda daily  . Intermittent claudication ([place]) 07/18/2015   Overview:  Operative management: The patient will be scheduled for a left femoral to  popliteal bypass in the near future after cardiac risk stratification. He  does have some superficial femoral and tibial artery occlusive disease in  the right leg. However his ABI on the right side is greater than 0.9.  Hopefully he will improve with just a walking program the right leg alone.  If not we could c  . Lumbosacral spondylosis with radiculopathy 02/17/2016   . Nevus of choroid of left eye 12/08/2014  . Numbness    lower left leg and upper right thigh  . Occlusion and stenosis of carotid artery without mention of cerebral infarction 01/01/2013  . Pneumonia    hx of-couple of yrs ago  . Preoperative cardiovascular examination 07/17/2015   Overview:  Echo 2013 with EF 55-60% Lexiscan MPS 11/12/12 with normal perfusion and function  Overview:  Echo 2013 with EF 55-60% Lexiscan MPS 11/12/12 with normal perfusion and function  . Serous retinal detachment of left eye 12/08/2014  . Shortness of breath dyspnea    with exertion  . Status post intraocular lens implant 12/08/2014  . Tuberculosis 2006    9 months   . Vertigo     Past Surgical History:  Procedure Laterality Date  . ABDOMINAL EXPOSURE N/A 01/22/2017   Procedure: ABDOMINAL EXPOSURE;  Surgeon: [name], MD;  Location: [facility];  Service: Vascular;  Laterality: N/A;  . ANTERIOR LAT LUMBAR FUSION N/A 01/22/2017   Procedure: LUMBAR THREE-FOUR, LUMBAR FOUR-FIVE ANTERIOR LATERAL INTERBODY FUSION;  Surgeon: [name], MD;  Location: [facility];  Service: Neurosurgery;  Laterality: N/A;  L3-4 L4-5 Lateral interbody fusion  . ANTERIOR LUMBAR FUSION N/A 01/22/2017   Procedure: LUMBAR FIVE-SACRUM ONE ANTERIOR LUMBAR INTERBODY FUSION WITH ABDOMINAL APPROACH BY DR [name];  Surgeon: [name], MD;  Location: [facility];  Service: Neurosurgery;  Laterality: N/A;  . CAROTID BODY TUMOR EXCISION Left 09/21/2015   Procedure: EXCISE LEFT NECK NODULE WITH LOCAL ;  Surgeon: [name], MD;  Location: [facility];  Service: Vascular;  Laterality: Left;  . CAROTID ENDARTERECTOMY  Nov. 15,2011   LEFT cea  . cataract surgery Bilateral   . COLONOSCOPY    . EYE SURGERY    . FEMORAL-POPLITEAL BYPASS GRAFT Left 08/23/2015   Procedure: LEFT FEMORAL-POPLITEAL ARTERY BYPASS WITH GORETEX GRAFT;  Surgeon: [name], MD;  Location: [facility];  Service: Vascular;  Laterality: Left;  . JOINT REPLACEMENT   1980   RIGHT  knee  . LUMBAR EPIDURAL INJECTION    . LUMBAR LAMINECTOMY/DECOMPRESSION MICRODISCECTOMY Right 02/17/2016   Procedure: Laminectomy and Foraminotomy - Lumbar four-five right;  Surgeon: [name], MD;  Location: [facility] [facility];  Service: Neurosurgery;  Laterality: Right;  right  . LUMBAR PERCUTANEOUS PEDICLE SCREW 3 LEVEL N/A 01/22/2017   Procedure: LUMBAR THREE-SACRAL ONE PERCUTANEOUS PEDICLE SCREW FIXATION WITH ROBOTIC ASSISTANCE;  Surgeon: [name], MD;  Location: [facility];  Service: Neurosurgery;  Laterality: N/A;  L3 to S1 Percutaneous pedicle screw fixation  . PERIPHERAL VASCULAR CATHETERIZATION N/A 06/24/2015   Procedure: Abdominal  Aortogram;  Surgeon: [name], MD;  Location: [place] [facility];  Service: Cardiovascular;  Laterality: N/A;  . Stents  Aug.  23, 1999   Bilateral iliofemoral  stents, [place].  . TONSILLECTOMY      Allergies  Allergen Reactions  . Lopressor [Metoprolol Tartrate] Other (See Comments)    Severe bradycardia    Review of systems: He denies shortness of breath.  He denies chest pain.  Physical exam:  Vitals:   02/19/19 1137  BP: 134/81  Pulse: 67  Resp: 18  Temp: (!) 97.4 F (36.3 C)  SpO2: 97%  Weight: 192 lb 12.7 oz (87.5 kg)  Height: 5\' 10"  (1.778 m)   General: Alert and oriented no acute distress  HEENT: Normal  Neck: No carotid bruit  Chest: Clear to auscultation bilaterally  Cardiac: Regular rate and rhythm without murmur  Abdomen: Soft nontender nondistended no mass  Skin: No open ulcer or rash  Extremities: 2+ radial brachial femoral absent dorsalis pedis posterior tibial pulses bilaterally  Data: Patient had bilateral ABIs performed #2019 which were 0.6 on the left 0.9 on the right  Assessment: Lifestyle limiting claudication left leg.  Patient wishes to pursue redo revascularization.  Risk benefits possible complications of procedure details of arteriogram followed by a left leg bypass were  all described to the patient today.  These include but are not limited to bleeding infection possible limb loss possible contrast nephropathy causing permanent dialysis.  He understands and agrees to proceed.  Plan: Abdominal aortogram lower extremity runoff scheduled for tomorrow February 20 2019.  We will try to limit contrast and use carbon dioxide contrast where possible.  He is then scheduled for a redo left leg bypass on Monday, February 23, 2019.  Ruta Hinds, MD [facility] [place] Office: [phone] Pager: [phone]

## 2019-02-20 ENCOUNTER — Encounter (HOSPITAL_COMMUNITY): Payer: Self-pay | Admitting: *Deleted

## 2019-02-20 ENCOUNTER — Other Ambulatory Visit: Payer: Self-pay

## 2019-02-20 ENCOUNTER — Other Ambulatory Visit: Payer: Self-pay | Admitting: *Deleted

## 2019-02-20 ENCOUNTER — Ambulatory Visit (HOSPITAL_COMMUNITY)
Admission: RE | Admit: 2019-02-20 | Discharge: 2019-02-20 | Disposition: A | Discharge status: Home / Self Care | Payer: Medicare Other | Source: Home / Self Care | Attending: Vascular Surgery | Admitting: Vascular Surgery

## 2019-02-20 ENCOUNTER — Encounter (HOSPITAL_COMMUNITY)
Admission: RE | Disposition: A | Discharge status: Home / Self Care | Payer: Self-pay | Source: Home / Self Care | Attending: Vascular Surgery

## 2019-02-20 DIAGNOSIS — Z79899 Other long term (current) drug therapy: Secondary | ICD-10-CM

## 2019-02-20 DIAGNOSIS — F329 Major depressive disorder, single episode, unspecified: Secondary | ICD-10-CM | POA: Insufficient documentation

## 2019-02-20 DIAGNOSIS — E1151 Type 2 diabetes mellitus with diabetic peripheral angiopathy without gangrene: Secondary | ICD-10-CM

## 2019-02-20 DIAGNOSIS — E1122 Type 2 diabetes mellitus with diabetic chronic kidney disease: Secondary | ICD-10-CM | POA: Insufficient documentation

## 2019-02-20 DIAGNOSIS — Z7982 Long term (current) use of aspirin: Secondary | ICD-10-CM | POA: Insufficient documentation

## 2019-02-20 DIAGNOSIS — J449 Chronic obstructive pulmonary disease, unspecified: Secondary | ICD-10-CM | POA: Insufficient documentation

## 2019-02-20 DIAGNOSIS — I129 Hypertensive chronic kidney disease with stage 1 through stage 4 chronic kidney disease, or unspecified chronic kidney disease: Secondary | ICD-10-CM

## 2019-02-20 DIAGNOSIS — M1990 Unspecified osteoarthritis, unspecified site: Secondary | ICD-10-CM | POA: Insufficient documentation

## 2019-02-20 DIAGNOSIS — I70212 Atherosclerosis of native arteries of extremities with intermittent claudication, left leg: Secondary | ICD-10-CM

## 2019-02-20 DIAGNOSIS — F0390 Unspecified dementia without behavioral disturbance: Secondary | ICD-10-CM

## 2019-02-20 DIAGNOSIS — F17210 Nicotine dependence, cigarettes, uncomplicated: Secondary | ICD-10-CM | POA: Insufficient documentation

## 2019-02-20 DIAGNOSIS — Z8719 Personal history of other diseases of the digestive system: Secondary | ICD-10-CM | POA: Insufficient documentation

## 2019-02-20 DIAGNOSIS — N183 Chronic kidney disease, stage 3 (moderate): Secondary | ICD-10-CM | POA: Insufficient documentation

## 2019-02-20 DIAGNOSIS — K449 Diaphragmatic hernia without obstruction or gangrene: Secondary | ICD-10-CM | POA: Insufficient documentation

## 2019-02-20 DIAGNOSIS — E785 Hyperlipidemia, unspecified: Secondary | ICD-10-CM | POA: Insufficient documentation

## 2019-02-20 DIAGNOSIS — I2510 Atherosclerotic heart disease of native coronary artery without angina pectoris: Secondary | ICD-10-CM

## 2019-02-20 DIAGNOSIS — G8929 Other chronic pain: Secondary | ICD-10-CM

## 2019-02-20 DIAGNOSIS — M109 Gout, unspecified: Secondary | ICD-10-CM

## 2019-02-20 HISTORY — PX: ABDOMINAL AORTOGRAM W/LOWER EXTREMITY: CATH118223

## 2019-02-20 LAB — POCT I-STAT 4, (NA,K, GLUC, HGB,HCT)
Glucose, Bld: 89 mg/dL (ref 70–99)
HCT: 38 % — ABNORMAL LOW (ref 39.0–52.0)
HEMOGLOBIN: 12.9 g/dL — AB (ref 13.0–17.0)
Potassium: 4.3 mmol/L (ref 3.5–5.1)
SODIUM: 142 mmol/L (ref 135–145)

## 2019-02-20 LAB — GLUCOSE, CAPILLARY
Glucose-Capillary: 91 mg/dL (ref 70–99)
Glucose-Capillary: 80 mg/dL (ref 70–99)

## 2019-02-20 LAB — POCT I-STAT CREATININE: Creatinine, Ser: 2.3 mg/dL — ABNORMAL HIGH (ref 0.61–1.24)

## 2019-02-20 SURGERY — ABDOMINAL AORTOGRAM W/LOWER EXTREMITY
Anesthesia: LOCAL

## 2019-02-20 MED ORDER — SODIUM CHLORIDE 0.9 % IV SOLN
INTRAVENOUS | Status: DC
  Administered 2019-02-20: 11:00:00 via INTRAVENOUS

## 2019-02-20 MED ORDER — HYDRALAZINE HCL 20 MG/ML IJ SOLN
INTRAMUSCULAR | Status: DC | PRN
  Administered 2019-02-20: 10 mg via INTRAVENOUS

## 2019-02-20 MED ORDER — LIDOCAINE HCL (PF) 1 % IJ SOLN
INTRAMUSCULAR | Status: DC | PRN
  Administered 2019-02-20 (×2): 20 mL via INTRADERMAL

## 2019-02-20 MED ORDER — ACETAMINOPHEN 325 MG PO TABS: 650.0000 mg | ORAL_TABLET | ORAL | Status: DC | PRN

## 2019-02-20 MED ORDER — LABETALOL HCL 5 MG/ML IV SOLN
INTRAVENOUS | Status: AC
  Filled 2019-02-20: qty 4

## 2019-02-20 MED ORDER — HEPARIN (PORCINE) IN NACL 1000-0.9 UT/500ML-% IV SOLN
INTRAVENOUS | Status: DC | PRN
  Administered 2019-02-20 (×2): 500 mL

## 2019-02-20 MED ORDER — HEPARIN (PORCINE) IN NACL 1000-0.9 UT/500ML-% IV SOLN
INTRAVENOUS | Status: AC
  Filled 2019-02-20: qty 1000

## 2019-02-20 MED ORDER — SODIUM CHLORIDE 0.9 % IV SOLN: INTRAVENOUS | Status: AC

## 2019-02-20 MED ORDER — SODIUM CHLORIDE 0.9 % IV SOLN: 250.0000 mL | INTRAVENOUS | Status: DC | PRN

## 2019-02-20 MED ORDER — SODIUM CHLORIDE 0.9% FLUSH: 3.0000 mL | Freq: Two times a day (BID) | INTRAVENOUS | Status: DC

## 2019-02-20 MED ORDER — LIDOCAINE HCL (PF) 1 % IJ SOLN
INTRAMUSCULAR | Status: AC
  Filled 2019-02-20: qty 30

## 2019-02-20 MED ORDER — HYDRALAZINE HCL 20 MG/ML IJ SOLN
INTRAMUSCULAR | Status: AC
  Filled 2019-02-20: qty 1

## 2019-02-20 MED ORDER — ONDANSETRON HCL 4 MG/2ML IJ SOLN: 4.0000 mg | Freq: Four times a day (QID) | INTRAMUSCULAR | Status: DC | PRN

## 2019-02-20 MED ORDER — LABETALOL HCL 5 MG/ML IV SOLN
INTRAVENOUS | Status: DC | PRN
  Administered 2019-02-20: 10 mg via INTRAVENOUS

## 2019-02-20 MED ORDER — IODIXANOL 320 MG/ML IV SOLN
INTRAVENOUS | Status: DC | PRN
  Administered 2019-02-20: 75 mL via INTRA_ARTERIAL

## 2019-02-20 MED ORDER — HYDRALAZINE HCL 20 MG/ML IJ SOLN
5.0000 mg | INTRAMUSCULAR | Status: AC | PRN
  Administered 2019-02-20 (×2): 5 mg via INTRAVENOUS

## 2019-02-20 MED ORDER — LABETALOL HCL 5 MG/ML IV SOLN
10.0000 mg | INTRAVENOUS | Status: DC | PRN
  Administered 2019-02-20: 10 mg via INTRAVENOUS

## 2019-02-20 MED ORDER — SODIUM CHLORIDE 0.9% FLUSH: 3.0000 mL | INTRAVENOUS | Status: DC | PRN

## 2019-02-20 SURGICAL SUPPLY — 15 items
CATH ANGIO 5F PIGTAIL 65CM (CATHETERS) ×1 IMPLANT
CATH SOS OMNI O 5F 80CM (CATHETERS) ×1 IMPLANT
FILTER CO2 0.2 MICRON (VASCULAR PRODUCTS) ×2 IMPLANT
GUIDEWIRE ANGLED .035X150CM (WIRE) ×1 IMPLANT
KIT PV (KITS) ×2 IMPLANT
RESERVOIR CO2 (VASCULAR PRODUCTS) ×1 IMPLANT
SET FLUSH CO2 (MISCELLANEOUS) ×1 IMPLANT
SHEATH PINNACLE 5F 10CM (SHEATH) ×2 IMPLANT
SHEATH PINNACLE 6F 10CM (SHEATH) ×1 IMPLANT
SHEATH PROBE COVER 6X72 (BAG) ×2 IMPLANT
SYR MEDRAD MARK V 150ML (SYRINGE) ×1 IMPLANT
TRANSDUCER W/STOPCOCK (MISCELLANEOUS) ×2 IMPLANT
TRAY PV CATH (CUSTOM PROCEDURE TRAY) ×2 IMPLANT
WIRE HITORQ VERSACORE ST 145CM (WIRE) ×1 IMPLANT
WIRE ROSEN-J .035X180CM (WIRE) ×1 IMPLANT

## 2019-02-20 NOTE — Op Note (Signed)
Procedure: Abdominal aortogram left lower extremity runoff  Preoperative diagnosis: Claudication left leg  Postoperative diagnosis: Same  Anesthesia: Local  Operative findings:  1.  Patent bilateral common iliac stents (crossed limbs)  2.  75 cc contrast  3.  Occlusion left superficial femoral artery with reconstitution of the above-knee popliteal artery primary runoff via the anterior tibial and peroneal arteries although there is some stenosis within the anterior tibial artery  Operative details: After informed consent, patient taken the PV lab.  The patient was placed supine position Angie table.  Both groins were prepped and draped in usual sterile fashion.  Local anesthesia was infiltrated of the right common femoral artery.  Ultrasound was used to identify the right common femoral artery and femoral bifurcation.  This was patent.  Image was obtained.  Using ultrasound guidance the right common femoral artery was cannulated and an 035 versa core wire threaded in the abdominal aorta under fluoroscopic guidance.  Next a 5 French sheath placed over the guidewire in the right common femoral artery.  This was thoroughly flushed with heparinized saline.  A 5 French pigtail catheter was then advanced up in the abdominal aorta and abdominal aortogram was obtained in AP projection.  Carbon dioxide contrast was used for the aortoiliac segments.  This shows the left and right renal arteries are patent.  However with only CO2 contrast they are not well visualized.  The infrarenal abdominal aorta is patent.  Left and right common iliac arteries have stents within them and these appear patent.  Bilateral oblique views of the pelvis were done in the 30 RAO 30 LAO projection due to overlying orthopedic hardware which again confirmed that the iliac stents were patent.  The left and right external and internal iliac arteries are patent.  The previously placed iliac stents are in a crossed limb configuration.  I  tried to use a Sos Omni catheter to selectively catheterize this and advanced down the left leg however due to the placement of the stents I was unable to get the catheter to track over the wire and down into the leg.  Therefore at this point ultrasound was used to identify the left common femoral artery and femoral bifurcation.  These were patent.  An image was obtained for the patient's record.  Ultrasound was used after infiltration of local anesthesia to cannulate the left common femoral artery.  There was some scar tissue there from previous femoropopliteal bypass.  Therefore we used a 5 [place] followed by a 6 [place] dilator followed by a 5 [place] sheath over the guidewire after cannulating the artery.  The remaining images were obtained through the sheath in the left leg using contrast.  The left common femoral artery is patent.  The profunda femoris is patent.  The SFA is occluded at its origin.  There is reconstitution of the above-knee popliteal artery and what appears to be portions of the distal bypass via profunda collaterals.  The below-knee popliteal artery is patent.  It is fairly short with a high takeoff of the anterior tibial artery.  The anterior tibial artery is patent but there is a 70% stenosis and segment of almost subtotal occlusion about 3 cm from its origin.  The vessel is in continuity all the way to the level of foot and does feel the dorsalis pedis artery.  The peroneal artery is also patent throughout its course.  Posterior tibial artery is occluded.  This portion of the case was concluded.  The patient tired procedure  well and there were no complications.  The patient was taken to holding area to have the sheath removed in the holding area.  Operative management: The patient is scheduled for a redo left femoral to above-knee or below-knee popliteal bypass with prosthetic on Monday, February 23, 2019.  Ruta Hinds, MD [facility] [place] Office:  [phone] Pager: [phone]

## 2019-02-20 NOTE — Anesthesia Preprocedure Evaluation (Addendum)
Anesthesia Evaluation  Patient identified by MRN, date of birth, ID band Patient awake    Reviewed: Allergy & Precautions, NPO status , Patient's Chart, lab work & pertinent test results  Airway Mallampati: II  TM Distance: >3 FB     Dental  (+) Dental Advisory Given   Pulmonary asthma , COPD, Current Smoker,    breath sounds clear to auscultation       Cardiovascular hypertension, Pt. on medications + CAD and + Peripheral Vascular Disease   Rhythm:Regular Rate:Normal     Neuro/Psych  Neuromuscular disease    GI/Hepatic Neg liver ROS, hiatal hernia, GERD  ,  Endo/Other  diabetes, Type 2  Renal/GU Renal disease     Musculoskeletal   Abdominal   Peds  Hematology negative hematology ROS (+)   Anesthesia Other Findings   Reproductive/Obstetrics                            Anesthesia Physical Anesthesia Plan  ASA: III  Anesthesia Plan: General   Post-op Pain Management:    Induction: Intravenous  PONV Risk Score and Plan: 1 and Dexamethasone, Ondansetron and Treatment may vary due to age or medical condition  Airway Management Planned: Oral ETT  Additional Equipment:   Intra-op Plan:   Post-operative Plan: Extubation in OR  Informed Consent: I have reviewed the patients History and Physical, chart, labs and discussed the procedure including the risks, benefits and alternatives for the proposed anesthesia with the patient or authorized representative who has indicated his/her understanding and acceptance.     Dental advisory given  Plan Discussed with:   Anesthesia Plan Comments:        Anesthesia Quick Evaluation

## 2019-02-20 NOTE — Progress Notes (Signed)
Site area: left groin fa sheath Site Prior to Removal:  Level 0 Pressure Applied For:  20 minutes Manual:   yes Patient Status During Pull:  stable Post Pull Site:  Level  0 Post Pull Instructions Given:  yes Post Pull Pulses Present: left dp palpable Dressing Applied:  Gauze and tegaderm Bedrest begins @ 1520 Comments:

## 2019-02-20 NOTE — Progress Notes (Signed)
Site area: rt groin fa sheath Site Prior to Removal:  Level 0 Pressure Applied For: 20 minutes Manual:   yes Patient Status During Pull:  stable Post Pull Site:  Level 0 Post Pull Instructions Given:  yes Post Pull Pulses Present: rt dp palpable Dressing Applied:  Gauze and tegaderm Bedrest begins @  Comments:

## 2019-02-20 NOTE — Discharge Instructions (Signed)

## 2019-02-20 NOTE — Interval H&P Note (Signed)
History and Physical Interval Note:  02/20/2019 12:33 PM  Joseph Hebert.  has presented today for surgery, with the diagnosis of PAD  The various methods of treatment have been discussed with the patient and family. After consideration of risks, benefits and other options for treatment, the patient has consented to  Procedure(s): ABDOMINAL AORTOGRAM W/LOWER EXTREMITY (N/A) as a surgical intervention .  The patient's history has been reviewed, patient examined, no change in status, stable for surgery.  I have reviewed the patient's chart and labs.  Questions were answered to the patient's satisfaction.     Ruta Hinds

## 2019-02-20 NOTE — Progress Notes (Signed)
Pt denies SOB and chest pain. Pt stated that he is under the care of Dr. Bettina Gavia, Cardiology. Pt stated that a cardiac cath was performed > 10 years ago. Pt denies having a chest x ray within the last year. Pt made aware to stop taking vitamins, fish oil  and herbal medications. Do not take any NSAIDs ie: Ibuprofen, Advil, Naproxen (Aleve), Motrin, BC and Goody powder. Pt made aware to check BG every 2 hours prior to arrival to hospital on DOS. Pt made aware to treat a BG < 70 with 4 ounces of apple juice, wait 15 minutes after intervention to recheck BG, if BG remains < 70, call Short Stay unit to speak with a nurse. Pt verbalized understanding of all pre-op instructions.

## 2019-02-23 ENCOUNTER — Encounter (HOSPITAL_COMMUNITY)
Admission: RE | Disposition: A | Discharge status: Home Health | Payer: Self-pay | Source: Home / Self Care | Attending: Vascular Surgery

## 2019-02-23 ENCOUNTER — Other Ambulatory Visit: Payer: Self-pay

## 2019-02-23 ENCOUNTER — Inpatient Hospital Stay (HOSPITAL_COMMUNITY): Payer: Medicare Other | Admitting: Physician Assistant

## 2019-02-23 ENCOUNTER — Inpatient Hospital Stay (HOSPITAL_COMMUNITY)
Admission: RE | Admit: 2019-02-23 | Discharge: 2019-02-26 | DRG: 254 | Disposition: A | Discharge status: Home Health | Payer: Medicare Other | Attending: Vascular Surgery | Admitting: Vascular Surgery

## 2019-02-23 ENCOUNTER — Encounter (HOSPITAL_COMMUNITY): Payer: Self-pay | Admitting: Vascular Surgery

## 2019-02-23 DIAGNOSIS — M549 Dorsalgia, unspecified: Secondary | ICD-10-CM | POA: Diagnosis present

## 2019-02-23 DIAGNOSIS — J449 Chronic obstructive pulmonary disease, unspecified: Secondary | ICD-10-CM | POA: Diagnosis present

## 2019-02-23 DIAGNOSIS — Z8674 Personal history of sudden cardiac arrest: Secondary | ICD-10-CM | POA: Diagnosis not present

## 2019-02-23 DIAGNOSIS — E1151 Type 2 diabetes mellitus with diabetic peripheral angiopathy without gangrene: Secondary | ICD-10-CM | POA: Diagnosis present

## 2019-02-23 DIAGNOSIS — K219 Gastro-esophageal reflux disease without esophagitis: Secondary | ICD-10-CM | POA: Diagnosis present

## 2019-02-23 DIAGNOSIS — I739 Peripheral vascular disease, unspecified: Secondary | ICD-10-CM | POA: Diagnosis not present

## 2019-02-23 DIAGNOSIS — G8929 Other chronic pain: Secondary | ICD-10-CM | POA: Diagnosis present

## 2019-02-23 DIAGNOSIS — I70612 Atherosclerosis of nonbiological bypass graft(s) of the extremities with intermittent claudication, left leg: Secondary | ICD-10-CM | POA: Diagnosis present

## 2019-02-23 DIAGNOSIS — I129 Hypertensive chronic kidney disease with stage 1 through stage 4 chronic kidney disease, or unspecified chronic kidney disease: Secondary | ICD-10-CM | POA: Diagnosis present

## 2019-02-23 DIAGNOSIS — F17210 Nicotine dependence, cigarettes, uncomplicated: Secondary | ICD-10-CM | POA: Diagnosis present

## 2019-02-23 DIAGNOSIS — E785 Hyperlipidemia, unspecified: Secondary | ICD-10-CM | POA: Diagnosis present

## 2019-02-23 DIAGNOSIS — I70212 Atherosclerosis of native arteries of extremities with intermittent claudication, left leg: Secondary | ICD-10-CM

## 2019-02-23 DIAGNOSIS — F0390 Unspecified dementia without behavioral disturbance: Secondary | ICD-10-CM | POA: Diagnosis present

## 2019-02-23 DIAGNOSIS — Z7982 Long term (current) use of aspirin: Secondary | ICD-10-CM | POA: Diagnosis not present

## 2019-02-23 DIAGNOSIS — I2510 Atherosclerotic heart disease of native coronary artery without angina pectoris: Secondary | ICD-10-CM | POA: Diagnosis present

## 2019-02-23 DIAGNOSIS — N183 Chronic kidney disease, stage 3 (moderate): Secondary | ICD-10-CM | POA: Diagnosis present

## 2019-02-23 DIAGNOSIS — Z8611 Personal history of tuberculosis: Secondary | ICD-10-CM | POA: Diagnosis not present

## 2019-02-23 DIAGNOSIS — E1122 Type 2 diabetes mellitus with diabetic chronic kidney disease: Secondary | ICD-10-CM | POA: Diagnosis present

## 2019-02-23 HISTORY — DX: Presence of spectacles and contact lenses: Z97.3

## 2019-02-23 HISTORY — DX: Peripheral vascular disease, unspecified: I73.9

## 2019-02-23 HISTORY — PX: FEMORAL-POPLITEAL BYPASS GRAFT: SHX937

## 2019-02-23 LAB — TYPE AND SCREEN
ABO/RH(D): O POS
Antibody Screen: NEGATIVE

## 2019-02-23 LAB — HEMOGLOBIN A1C
Hgb A1c MFr Bld: 5.7 % — ABNORMAL HIGH (ref 4.8–5.6)
Mean Plasma Glucose: 116.89 mg/dL

## 2019-02-23 LAB — URINALYSIS, ROUTINE W REFLEX MICROSCOPIC
BILIRUBIN URINE: NEGATIVE
Bacteria, UA: NONE SEEN
Glucose, UA: NEGATIVE mg/dL
Ketones, ur: NEGATIVE mg/dL
Leukocytes,Ua: NEGATIVE
Nitrite: NEGATIVE
Protein, ur: 100 mg/dL — AB
Specific Gravity, Urine: 1.013 (ref 1.005–1.030)
pH: 5 (ref 5.0–8.0)

## 2019-02-23 LAB — COMPREHENSIVE METABOLIC PANEL
ALK PHOS: 108 U/L (ref 38–126)
ALT: 22 U/L (ref 0–44)
ANION GAP: 9 (ref 5–15)
AST: 17 U/L (ref 15–41)
Albumin: 3.4 g/dL — ABNORMAL LOW (ref 3.5–5.0)
BUN: 22 mg/dL (ref 8–23)
CALCIUM: 8.9 mg/dL (ref 8.9–10.3)
CO2: 18 mmol/L — ABNORMAL LOW (ref 22–32)
Chloride: 111 mmol/L (ref 98–111)
Creatinine, Ser: 2.08 mg/dL — ABNORMAL HIGH (ref 0.61–1.24)
GFR calc Af Amer: 36 mL/min — ABNORMAL LOW (ref >60)
GFR calc non Af Amer: 31 mL/min — ABNORMAL LOW (ref >60)
Glucose, Bld: 106 mg/dL — ABNORMAL HIGH (ref 70–99)
Potassium: 4.3 mmol/L (ref 3.5–5.1)
Sodium: 138 mmol/L (ref 135–145)
Total Bilirubin: 0.8 mg/dL (ref 0.3–1.2)
Total Protein: 6.4 g/dL — ABNORMAL LOW (ref 6.5–8.1)

## 2019-02-23 LAB — PROTIME-INR
INR: 1 (ref 0.8–1.2)
Prothrombin Time: 12.9 seconds (ref 11.4–15.2)

## 2019-02-23 LAB — CBC
HCT: 38.5 % — ABNORMAL LOW (ref 39.0–52.0)
Hemoglobin: 12 g/dL — ABNORMAL LOW (ref 13.0–17.0)
MCH: 28.8 pg (ref 26.0–34.0)
MCHC: 31.2 g/dL (ref 30.0–36.0)
MCV: 92.5 fL (ref 80.0–100.0)
NRBC: 0 % (ref 0.0–0.2)
Platelets: 276 10*3/uL (ref 150–400)
RBC: 4.16 MIL/uL — ABNORMAL LOW (ref 4.22–5.81)
RDW: 14.6 % (ref 11.5–15.5)
WBC: 10.2 10*3/uL (ref 4.0–10.5)

## 2019-02-23 LAB — GLUCOSE, CAPILLARY
Glucose-Capillary: 92 mg/dL (ref 70–99)
Glucose-Capillary: 118 mg/dL — ABNORMAL HIGH (ref 70–99)
Glucose-Capillary: 119 mg/dL — ABNORMAL HIGH (ref 70–99)
Glucose-Capillary: 170 mg/dL — ABNORMAL HIGH (ref 70–99)

## 2019-02-23 LAB — APTT: aPTT: 36 seconds (ref 24–36)

## 2019-02-23 SURGERY — BYPASS GRAFT FEMORAL-POPLITEAL ARTERY
Anesthesia: General | Site: Leg Lower | Laterality: Left

## 2019-02-23 MED ORDER — DEXAMETHASONE SODIUM PHOSPHATE 10 MG/ML IJ SOLN
INTRAMUSCULAR | Status: DC | PRN
  Administered 2019-02-23: 6 mg via INTRAVENOUS

## 2019-02-23 MED ORDER — HYDRALAZINE HCL 20 MG/ML IJ SOLN
5.0000 mg | INTRAMUSCULAR | Status: AC | PRN
  Administered 2019-02-25 (×2): 5 mg via INTRAVENOUS
  Filled 2019-02-23 (×2): qty 1

## 2019-02-23 MED ORDER — ACETAMINOPHEN 10 MG/ML IV SOLN
INTRAVENOUS | Status: DC | PRN
  Administered 2019-02-23: 1000 mg via INTRAVENOUS

## 2019-02-23 MED ORDER — PANTOPRAZOLE SODIUM 40 MG PO TBEC
40.0000 mg | DELAYED_RELEASE_TABLET | Freq: Every day | ORAL | Status: DC
  Administered 2019-02-23 – 2019-02-25 (×3): 40 mg via ORAL
  Filled 2019-02-23 (×3): qty 1

## 2019-02-23 MED ORDER — PROTAMINE SULFATE 10 MG/ML IV SOLN
INTRAVENOUS | Status: DC | PRN
  Administered 2019-02-23: 50 mg via INTRAVENOUS

## 2019-02-23 MED ORDER — CHLORHEXIDINE GLUCONATE 4 % EX LIQD: 60.0000 mL | Freq: Once | CUTANEOUS | Status: DC

## 2019-02-23 MED ORDER — OXYCODONE-ACETAMINOPHEN 5-325 MG PO TABS
1.0000 | ORAL_TABLET | ORAL | Status: DC | PRN
  Administered 2019-02-25 (×2): 2 via ORAL
  Filled 2019-02-23 (×2): qty 2

## 2019-02-23 MED ORDER — 0.9 % SODIUM CHLORIDE (POUR BTL) OPTIME
TOPICAL | Status: DC | PRN
  Administered 2019-02-23 (×2): 1000 mL

## 2019-02-23 MED ORDER — GLYCOPYRROLATE PF 0.2 MG/ML IJ SOSY
PREFILLED_SYRINGE | INTRAMUSCULAR | Status: DC | PRN
  Administered 2019-02-23 (×4): .1 mg via INTRAVENOUS

## 2019-02-23 MED ORDER — ROCURONIUM BROMIDE 10 MG/ML (PF) SYRINGE
PREFILLED_SYRINGE | INTRAVENOUS | Status: DC | PRN
  Administered 2019-02-23 (×2): 10 mg via INTRAVENOUS
  Administered 2019-02-23: 5 mg via INTRAVENOUS
  Administered 2019-02-23 (×2): 10 mg via INTRAVENOUS
  Administered 2019-02-23: 40 mg via INTRAVENOUS
  Administered 2019-02-23: 10 mg via INTRAVENOUS

## 2019-02-23 MED ORDER — ATORVASTATIN CALCIUM 80 MG PO TABS
80.0000 mg | ORAL_TABLET | Freq: Every day | ORAL | Status: DC
  Administered 2019-02-23 – 2019-02-25 (×3): 80 mg via ORAL
  Filled 2019-02-23 (×3): qty 1

## 2019-02-23 MED ORDER — AMLODIPINE BESYLATE 10 MG PO TABS
10.0000 mg | ORAL_TABLET | Freq: Every day | ORAL | Status: DC
  Administered 2019-02-23 – 2019-02-25 (×3): 10 mg via ORAL
  Filled 2019-02-23 (×3): qty 1

## 2019-02-23 MED ORDER — MAGNESIUM SULFATE 2 GM/50ML IV SOLN: 2.0000 g | Freq: Every day | INTRAVENOUS | Status: DC | PRN

## 2019-02-23 MED ORDER — SUGAMMADEX SODIUM 200 MG/2ML IV SOLN
INTRAVENOUS | Status: DC | PRN
  Administered 2019-02-23: 200 mg via INTRAVENOUS

## 2019-02-23 MED ORDER — ONDANSETRON HCL 4 MG/2ML IJ SOLN: 4.0000 mg | Freq: Four times a day (QID) | INTRAMUSCULAR | Status: DC | PRN

## 2019-02-23 MED ORDER — HEPARIN SODIUM (PORCINE) 1000 UNIT/ML IJ SOLN
INTRAMUSCULAR | Status: DC | PRN
  Administered 2019-02-23: 9000 [IU] via INTRAVENOUS

## 2019-02-23 MED ORDER — POLYETHYLENE GLYCOL 3350 17 G PO PACK: 17.0000 g | PACK | Freq: Every day | ORAL | Status: DC | PRN

## 2019-02-23 MED ORDER — SODIUM CHLORIDE 0.9 % IV SOLN: INTRAVENOUS | Status: DC

## 2019-02-23 MED ORDER — MEMANTINE HCL 10 MG PO TABS
10.0000 mg | ORAL_TABLET | Freq: Every day | ORAL | Status: DC
  Administered 2019-02-23 – 2019-02-25 (×3): 10 mg via ORAL
  Filled 2019-02-23 (×3): qty 1

## 2019-02-23 MED ORDER — FEBUXOSTAT 40 MG PO TABS
40.0000 mg | ORAL_TABLET | Freq: Every day | ORAL | Status: DC
  Administered 2019-02-23 – 2019-02-25 (×3): 40 mg via ORAL
  Filled 2019-02-23 (×3): qty 1

## 2019-02-23 MED ORDER — SODIUM CHLORIDE 0.9 % IV SOLN
INTRAVENOUS | Status: DC | PRN
  Administered 2019-02-23: 30 ug/min via INTRAVENOUS

## 2019-02-23 MED ORDER — CEFAZOLIN SODIUM-DEXTROSE 2-4 GM/100ML-% IV SOLN
2.0000 g | Freq: Three times a day (TID) | INTRAVENOUS | Status: AC
  Administered 2019-02-23: 2 g via INTRAVENOUS
  Filled 2019-02-23: qty 100

## 2019-02-23 MED ORDER — ACETAMINOPHEN 325 MG RE SUPP: 325.0000 mg | RECTAL | Status: DC | PRN

## 2019-02-23 MED ORDER — SODIUM CHLORIDE 0.9 % IV SOLN: 500.0000 mL | Freq: Once | INTRAVENOUS | Status: DC | PRN

## 2019-02-23 MED ORDER — LIDOCAINE 2% (20 MG/ML) 5 ML SYRINGE
INTRAMUSCULAR | Status: DC | PRN
  Administered 2019-02-23: 60 mg via INTRAVENOUS
  Administered 2019-02-23: 40 mg via INTRAVENOUS

## 2019-02-23 MED ORDER — ASPIRIN EC 81 MG PO TBEC
81.0000 mg | DELAYED_RELEASE_TABLET | Freq: Every day | ORAL | Status: DC
  Administered 2019-02-23 – 2019-02-25 (×3): 81 mg via ORAL
  Filled 2019-02-23 (×3): qty 1

## 2019-02-23 MED ORDER — CEFAZOLIN SODIUM-DEXTROSE 2-4 GM/100ML-% IV SOLN
2.0000 g | INTRAVENOUS | Status: AC
  Administered 2019-02-23: 2 g via INTRAVENOUS
  Filled 2019-02-23: qty 100

## 2019-02-23 MED ORDER — FENTANYL CITRATE (PF) 250 MCG/5ML IJ SOLN
INTRAMUSCULAR | Status: DC | PRN
  Administered 2019-02-23 (×2): 50 ug via INTRAVENOUS
  Administered 2019-02-23: 25 ug via INTRAVENOUS
  Administered 2019-02-23: 100 ug via INTRAVENOUS
  Administered 2019-02-23: 25 ug via INTRAVENOUS
  Administered 2019-02-23: 50 ug via INTRAVENOUS

## 2019-02-23 MED ORDER — GUAIFENESIN-DM 100-10 MG/5ML PO SYRP: 15.0000 mL | ORAL_SOLUTION | ORAL | Status: DC | PRN

## 2019-02-23 MED ORDER — LACTATED RINGERS IV SOLN
INTRAVENOUS | Status: DC
  Administered 2019-02-23: 09:00:00 via INTRAVENOUS

## 2019-02-23 MED ORDER — PHENOL 1.4 % MT LIQD: 1.0000 | OROMUCOSAL | Status: DC | PRN

## 2019-02-23 MED ORDER — SODIUM CHLORIDE 0.9 % IV SOLN
INTRAVENOUS | Status: DC
  Administered 2019-02-23: 16:00:00 via INTRAVENOUS

## 2019-02-23 MED ORDER — FENTANYL CITRATE (PF) 250 MCG/5ML IJ SOLN
INTRAMUSCULAR | Status: AC
  Filled 2019-02-23: qty 5

## 2019-02-23 MED ORDER — HEMOSTATIC AGENTS (NO CHARGE) OPTIME
TOPICAL | Status: DC | PRN
  Administered 2019-02-23 (×2): 1 via TOPICAL

## 2019-02-23 MED ORDER — SODIUM CHLORIDE 0.9 % IV SOLN
INTRAVENOUS | Status: DC | PRN
  Administered 2019-02-23: 10:00:00

## 2019-02-23 MED ORDER — MORPHINE SULFATE (PF) 2 MG/ML IV SOLN
2.0000 mg | INTRAVENOUS | Status: AC | PRN
  Administered 2019-02-24: 2 mg via INTRAVENOUS
  Filled 2019-02-23: qty 1

## 2019-02-23 MED ORDER — MIDAZOLAM HCL 2 MG/2ML IJ SOLN
INTRAMUSCULAR | Status: AC
  Filled 2019-02-23: qty 2

## 2019-02-23 MED ORDER — PROPOFOL 10 MG/ML IV BOLUS
INTRAVENOUS | Status: DC | PRN
  Administered 2019-02-23: 120 mg via INTRAVENOUS

## 2019-02-23 MED ORDER — FERROUS SULFATE 325 (65 FE) MG PO TABS
325.0000 mg | ORAL_TABLET | Freq: Every day | ORAL | Status: DC
  Administered 2019-02-23 – 2019-02-25 (×3): 325 mg via ORAL
  Filled 2019-02-23 (×3): qty 1

## 2019-02-23 MED ORDER — ACETAMINOPHEN 325 MG PO TABS
325.0000 mg | ORAL_TABLET | ORAL | Status: DC | PRN
  Filled 2019-02-23: qty 2

## 2019-02-23 MED ORDER — HEPARIN SODIUM (PORCINE) 5000 UNIT/ML IJ SOLN
5000.0000 [IU] | Freq: Three times a day (TID) | INTRAMUSCULAR | Status: DC
  Administered 2019-02-23 – 2019-02-26 (×8): 5000 [IU] via SUBCUTANEOUS
  Filled 2019-02-23 (×8): qty 1

## 2019-02-23 MED ORDER — ONDANSETRON HCL 4 MG/2ML IJ SOLN
INTRAMUSCULAR | Status: DC | PRN
  Administered 2019-02-23: 4 mg via INTRAVENOUS

## 2019-02-23 MED ORDER — DOCUSATE SODIUM 100 MG PO CAPS
100.0000 mg | ORAL_CAPSULE | Freq: Every day | ORAL | Status: DC
  Administered 2019-02-24 – 2019-02-25 (×2): 100 mg via ORAL
  Filled 2019-02-23 (×3): qty 1

## 2019-02-23 MED ORDER — MIDAZOLAM HCL 2 MG/2ML IJ SOLN
INTRAMUSCULAR | Status: DC | PRN
  Administered 2019-02-23: 2 mg via INTRAVENOUS

## 2019-02-23 MED ORDER — LACTATED RINGERS IV SOLN
INTRAVENOUS | Status: DC | PRN
  Administered 2019-02-23: 09:00:00 via INTRAVENOUS

## 2019-02-23 MED ORDER — ACETAMINOPHEN 10 MG/ML IV SOLN
INTRAVENOUS | Status: AC
  Filled 2019-02-23: qty 100

## 2019-02-23 MED ORDER — ALUM & MAG HYDROXIDE-SIMETH 200-200-20 MG/5ML PO SUSP: 15.0000 mL | ORAL | Status: DC | PRN

## 2019-02-23 SURGICAL SUPPLY — 61 items
ADH SKN CLS APL DERMABOND .7 (GAUZE/BANDAGES/DRESSINGS) ×2
AGENT HMST SPONGE THK3/8 (HEMOSTASIS) ×2
BANDAGE ESMARK 6X9 LF (GAUZE/BANDAGES/DRESSINGS) IMPLANT
BNDG CMPR 9X6 STRL LF SNTH (GAUZE/BANDAGES/DRESSINGS)
BNDG ESMARK 6X9 LF (GAUZE/BANDAGES/DRESSINGS)
CANISTER SUCT 3000ML PPV (MISCELLANEOUS) ×2 IMPLANT
CANNULA VESSEL 3MM 2 BLNT TIP (CANNULA) ×4 IMPLANT
CATH EMB 3FR 80CM (CATHETERS) ×1 IMPLANT
CLIP VESOCCLUDE MED 24/CT (CLIP) ×2 IMPLANT
CLIP VESOCCLUDE SM WIDE 24/CT (CLIP) ×2 IMPLANT
COVER WAND RF STERILE (DRAPES) ×2 IMPLANT
CUFF TOURNIQUET SINGLE 24IN (TOURNIQUET CUFF) IMPLANT
CUFF TOURNIQUET SINGLE 34IN LL (TOURNIQUET CUFF) IMPLANT
CUFF TOURNIQUET SINGLE 44IN (TOURNIQUET CUFF) IMPLANT
DERMABOND ADVANCED (GAUZE/BANDAGES/DRESSINGS) ×2
DERMABOND ADVANCED .7 DNX12 (GAUZE/BANDAGES/DRESSINGS) IMPLANT
DRAIN SNY WOU (WOUND CARE) IMPLANT
DRAPE HALF SHEET 40X57 (DRAPES) IMPLANT
DRAPE X-RAY CASS 24X20 (DRAPES) IMPLANT
ELECT REM PT RETURN 9FT ADLT (ELECTROSURGICAL) ×2
ELECTRODE REM PT RTRN 9FT ADLT (ELECTROSURGICAL) ×1 IMPLANT
EVACUATOR SILICONE 100CC (DRAIN) IMPLANT
GAUZE 4X4 16PLY RFD (DISPOSABLE) ×1 IMPLANT
GLOVE BIO SURGEON STRL SZ7.5 (GLOVE) ×2 IMPLANT
GLOVE BIOGEL PI IND STRL 6.5 (GLOVE) IMPLANT
GLOVE BIOGEL PI INDICATOR 6.5 (GLOVE) ×3
GLOVE ECLIPSE 6.5 STRL STRAW (GLOVE) ×3 IMPLANT
GOWN STRL REUS W/ TWL LRG LVL3 (GOWN DISPOSABLE) ×5 IMPLANT
GOWN STRL REUS W/TWL LRG LVL3 (GOWN DISPOSABLE) ×10
GRAFT PROPATEN THIN WALL 6X80 (Vascular Products) ×1 IMPLANT
HEMOSTAT SPONGE AVITENE ULTRA (HEMOSTASIS) ×4 IMPLANT
INSERT FOGARTY SM (MISCELLANEOUS) ×1 IMPLANT
KIT BASIN OR (CUSTOM PROCEDURE TRAY) ×2 IMPLANT
KIT TURNOVER KIT B (KITS) ×2 IMPLANT
LOOP VESSEL MAXI BLUE (MISCELLANEOUS) ×1 IMPLANT
LOOP VESSEL MINI RED (MISCELLANEOUS) ×1 IMPLANT
MARKER GRAFT CORONARY BYPASS (MISCELLANEOUS) ×1 IMPLANT
NS IRRIG 1000ML POUR BTL (IV SOLUTION) ×4 IMPLANT
PACK PERIPHERAL VASCULAR (CUSTOM PROCEDURE TRAY) ×2 IMPLANT
PAD ARMBOARD 7.5X6 YLW CONV (MISCELLANEOUS) ×4 IMPLANT
SET COLLECT BLD 21X3/4 12 (NEEDLE) IMPLANT
STOPCOCK 4 WAY LG BORE MALE ST (IV SETS) IMPLANT
SUT PROLENE 5 0 C 1 24 (SUTURE) ×3 IMPLANT
SUT PROLENE 6 0 CC (SUTURE) ×3 IMPLANT
SUT PROLENE 7 0 BV 1 (SUTURE) IMPLANT
SUT PROLENE 7 0 BV1 MDA (SUTURE) ×1 IMPLANT
SUT SILK 2 0 SH (SUTURE) ×2 IMPLANT
SUT SILK 3 0 (SUTURE)
SUT SILK 3-0 18XBRD TIE 12 (SUTURE) IMPLANT
SUT VIC AB 2-0 SH 27 (SUTURE) ×4
SUT VIC AB 2-0 SH 27XBRD (SUTURE) ×2 IMPLANT
SUT VIC AB 3-0 SH 27 (SUTURE) ×12
SUT VIC AB 3-0 SH 27X BRD (SUTURE) ×4 IMPLANT
SUT VIC AB 4-0 PS2 27 (SUTURE) ×4 IMPLANT
SYR 3ML LL SCALE MARK (SYRINGE) ×1 IMPLANT
TAPE UMBILICAL COTTON 1/8X30 (MISCELLANEOUS) ×2 IMPLANT
TOWEL GREEN STERILE (TOWEL DISPOSABLE) ×2 IMPLANT
TRAY FOLEY MTR SLVR 16FR STAT (SET/KITS/TRAYS/PACK) ×2 IMPLANT
TUBING EXTENTION W/L.L. (IV SETS) IMPLANT
UNDERPAD 30X30 (UNDERPADS AND DIAPERS) ×2 IMPLANT
WATER STERILE IRR 1000ML POUR (IV SOLUTION) ×2 IMPLANT

## 2019-02-23 NOTE — Progress Notes (Signed)
Pt awake and alert   Some incisional pain  Left foot pink warm doppler signals  No hematoma  Stable post op  [place], MD [facility] Office: [phone] Pager: [phone]

## 2019-02-23 NOTE — Op Note (Signed)
Procedure: Redo left femoral to above-knee popliteal bypass (6 mm propaten)  Preoperative diagnosis: Left leg claudication  Postoperative diagnosis: Same  Anesthesia: General  Assistant: Arlee Muslim, PA-C  Operative findings: Number one 6 mm PTFE with coronary marker placed at proximal anastomosis  Operative details: After obtaining informed consent, the patient was taken the operating.  The patient was placed in supine position operating table.  After induction of general anesthesia ventricular the patient, Foley catheter was placed.  Next patient's entire left lower extremities prepped and draped in usual sterile fashion.  Longitudinal incision was made through the left groin carried down through subcutaneous tissues down the level of a pre-existing femoral-popliteal prosthetic graft.  This was dissected free circumferentially and traced up to level the common femoral artery.  The profunda femoris and superficial femoral arteries were dissected free circumferentially.  Vesseloops were placed around these.  Dissection was carried up on the level of the common femoral artery and up to the level external iliac artery.  This artery was dissected free circumferentially as well as a circumflex iliac branch on the left side.  Vesseloops were placed around these as well.  There was some dense scar tissue in the area which made dissection fairly tedious.  This was packed with a moist lap pad and attention turned to the above-knee popliteal space.  Longitudinal incision was made through pre-existing incision on the medial aspect of the left leg.  The incision was carried down through subcutaneous tissues down to the level of the sartorius muscle which was reflected posteriorly and the above-knee popliteal space entered.  I then dissected out the pre-existing femoropopliteal bypass graft and traced this down to the above-knee popliteal artery.  The popliteal vein was slightly adherent and this was taken down  with cautery.  There were also dense adhesions in this area which made dissection fairly tedious.  The above-knee popliteal artery was dissected free circumferentially all the way down to several geniculate branches which were at about the level of the patella and just above the joint space.  The artery was soft in character in this region.  Next a tunnel was created in the subsartorial space connecting the above-knee incision to the groin incision.  The patient was then given a thousandths of intervenous heparin.  The distal external iliac artery was controlled with a vessel loop.  The superficial femoral and profunda femoris arteries are also controlled with Vesseloops.  The old anastomosis was taken down the common femoral artery opened longitudinally.  There was some ulceration on the posterior wall of the artery but otherwise this had a good lumen.  There was some thickening in the artery.  The graft was spatulated and sewn endograft to side of artery using a running 5-0 Prolene suture.  Prior to completion the anastomosis it was for blood backbled and thoroughly flushed anastomosis was secured clamps released there is pulsatile flow in the graft immediately.  The graft was clamped just above the level of the anastomosis.  There was some needle hole bleeding which was controlled with Avitene.  Attention was then turned to the above-knee popliteal space.  The graft was pulled taut the length.  The proximal popliteal artery was controlled with a Glover clamp.  The above-knee popliteal artery was controlled distally at several geniculate branches with Vesseloops as well as the distal popliteal artery just above the joint space.  Longitudinal opening was made in the popliteal artery and this had a reasonable quality lumen again fairly thickened but  very syllable and soft.  There was a little bit of thrombus in the artery so a #3 Fogarty catheter was passed on the popliteal artery to make sure no further thrombus  was obtained.  I also was able to pass a 3 dilator down the popliteal artery with no resistance whatsoever.  Graft was spatulated and sewn endograft to side of artery using a running 6-0 Prolene suture.  Despite completion anastomosis it was for blood backbled and thoroughly flushed anastomosis was secured clamps released there is pulsatile flow in the above-knee popliteal artery immediately.  Needle hole bleeding was controlled with some Avitene and 50 mg of protamine was also administered.  The patient had good Doppler flow in the dorsalis pedis and posterior tibial Doppler areas of the foot.  This augmented approximately 90% with clamping and unclamping the graft.  Hemostasis in both incisions was obtained.  Arteriogram was not obtained due to the patient's elevated creatinine greater than 2.  The groin and the above-knee popliteal incision were closed in multiple layers a running 2-0 and 3-0 Vicryl suture and a 4-0 Vicryl subcuticular stitch in the skin.  The patient tolerated procedure well and there were no complications.  The incident sponge and needle count was correct in the case.  The patient was taken the recovery room in stable condition.  Ruta Hinds, MD [facility] Office: [phone] Pager: [phone]

## 2019-02-23 NOTE — Anesthesia Postprocedure Evaluation (Signed)
Anesthesia Post Note  Patient: Joseph Hebert.  Procedure(s) Performed: REDO LEFT FEMORAL TO POPLITEAL ARTERY BYPASS GRAFT Using Propaten graft (Left Leg Lower)     Patient location during evaluation: PACU Anesthesia Type: General Level of consciousness: awake and alert Pain management: pain level controlled Vital Signs Assessment: post-procedure vital signs reviewed and stable Respiratory status: spontaneous breathing, nonlabored ventilation, respiratory function stable and patient connected to nasal cannula oxygen Cardiovascular status: blood pressure returned to baseline and stable Postop Assessment: no apparent nausea or vomiting Anesthetic complications: no    Last Vitals:  Vitals:   02/23/19 1440 02/23/19 1517  BP: (!) 148/81 (!) 161/82  Pulse: 63 76  Resp: 14 17  Temp: (!) 36.3 C 36.4 C  SpO2: 96% 97%    Last Pain:  Vitals:   02/23/19 1517  TempSrc: Oral  PainSc:                  [name]

## 2019-02-23 NOTE — Transfer of Care (Signed)
Immediate Anesthesia Transfer of Care Note  Patient: Joseph Hebert.  Procedure(s) Performed: REDO LEFT FEMORAL TO POPLITEAL ARTERY BYPASS GRAFT Using Propaten graft (Left Leg Lower)  Patient Location: PACU  Anesthesia Type:General  Level of Consciousness: awake and alert   Airway & Oxygen Therapy: Patient Spontanous Breathing and Patient connected to nasal cannula oxygen  Post-op Assessment: Report given to RN and Post -op Vital signs reviewed and stable  Post vital signs: Reviewed and stable  Last Vitals:  Vitals Value Taken Time  BP 156/72 02/23/2019  1:38 PM  Temp 36.2 C 02/23/2019  1:38 PM  Pulse 65 02/23/2019  1:48 PM  Resp 12 02/23/2019  1:48 PM  SpO2 98 % 02/23/2019  1:48 PM  Vitals shown include unvalidated device data.  Last Pain:  Vitals:   02/23/19 1338  TempSrc:   PainSc: 0-No pain         Complications: No apparent anesthesia complications

## 2019-02-23 NOTE — Interval H&P Note (Signed)
History and Physical Interval Note:  02/23/2019 8:23 AM  Joseph Hebert.  has presented today for surgery, with the diagnosis of PERIPHERAL ARTERY DISEASE.  The various methods of treatment have been discussed with the patient and family. After consideration of risks, benefits and other options for treatment, the patient has consented to  Procedure(s): REDO LEFT FEMORAL TO POPLITEAL ARTERY BYPASS GRAFT (Left) as a surgical intervention.  The patient's history has been reviewed, patient examined, no change in status, stable for surgery.  I have reviewed the patient's chart and labs.  Questions were answered to the patient's satisfaction.     Ruta Hinds

## 2019-02-23 NOTE — Anesthesia Procedure Notes (Signed)
Procedure Name: Intubation Date/Time: 02/23/2019 9:14 AM Performed by: Valda Favia, CRNA Pre-anesthesia Checklist: Patient identified, Emergency Drugs available, Suction available, Patient being monitored and Timeout performed Patient Re-evaluated:Patient Re-evaluated prior to induction Oxygen Delivery Method: Circle system utilized Preoxygenation: Pre-oxygenation with 100% oxygen Induction Type: IV induction Ventilation: Oral airway inserted - appropriate to patient size Laryngoscope Size: Glidescope and 4 Grade View: Grade I Tube type: Oral Tube size: 7.5 mm Number of attempts: 1 Airway Equipment and Method: Video-laryngoscopy and Stylet Placement Confirmation: ETT inserted through vocal cords under direct vision,  positive ETCO2,  CO2 detector and breath sounds checked- equal and bilateral Secured at: 23 cm Tube secured with: Tape

## 2019-02-24 ENCOUNTER — Encounter (HOSPITAL_COMMUNITY): Payer: Self-pay | Admitting: Vascular Surgery

## 2019-02-24 ENCOUNTER — Inpatient Hospital Stay (HOSPITAL_COMMUNITY): Payer: Medicare Other

## 2019-02-24 DIAGNOSIS — I739 Peripheral vascular disease, unspecified: Secondary | ICD-10-CM

## 2019-02-24 LAB — BASIC METABOLIC PANEL
Anion gap: 9 (ref 5–15)
BUN: 35 mg/dL — ABNORMAL HIGH (ref 8–23)
CO2: 19 mmol/L — ABNORMAL LOW (ref 22–32)
Calcium: 8.6 mg/dL — ABNORMAL LOW (ref 8.9–10.3)
Chloride: 107 mmol/L (ref 98–111)
Creatinine, Ser: 2.64 mg/dL — ABNORMAL HIGH (ref 0.61–1.24)
GFR calc Af Amer: 27 mL/min — ABNORMAL LOW (ref >60)
GFR calc non Af Amer: 23 mL/min — ABNORMAL LOW (ref >60)
Glucose, Bld: 191 mg/dL — ABNORMAL HIGH (ref 70–99)
POTASSIUM: 4.8 mmol/L (ref 3.5–5.1)
Sodium: 135 mmol/L (ref 135–145)

## 2019-02-24 LAB — CBC
HCT: 31.7 % — ABNORMAL LOW (ref 39.0–52.0)
Hemoglobin: 10 g/dL — ABNORMAL LOW (ref 13.0–17.0)
MCH: 29 pg (ref 26.0–34.0)
MCHC: 31.5 g/dL (ref 30.0–36.0)
MCV: 91.9 fL (ref 80.0–100.0)
Platelets: 253 10*3/uL (ref 150–400)
RBC: 3.45 MIL/uL — AB (ref 4.22–5.81)
RDW: 14.5 % (ref 11.5–15.5)
WBC: 11.7 10*3/uL — ABNORMAL HIGH (ref 4.0–10.5)
nRBC: 0 % (ref 0.0–0.2)

## 2019-02-24 LAB — GLUCOSE, CAPILLARY
Glucose-Capillary: 159 mg/dL — ABNORMAL HIGH (ref 70–99)
Glucose-Capillary: 195 mg/dL — ABNORMAL HIGH (ref 70–99)
Glucose-Capillary: 122 mg/dL — ABNORMAL HIGH (ref 70–99)

## 2019-02-24 NOTE — Progress Notes (Signed)
ABI/TBI completed. Please see preliminary notes on CV PROC under chart review. Latwan Luchsinger H Jadin Kagel(RDMS RVT) 02/24/19 3:13 PM

## 2019-02-24 NOTE — Evaluation (Signed)
Physical Therapy Evaluation Patient Details Name: Joseph Hebert. MRN: [id number] DOB: 28-Mar-1946 Today's Date: 02/24/2019   History of Present Illness  73 y.o. male with a hx of PAD, dyslipidemia T2 DM, hypertension, CKD, and s/p back sx 01/2017. He had L femoral-popliteal bypass 2016 and left CEA 2016. He was admitted to undergo redo of left femoral to above-knee popliteal bypass.     Clinical Impression  Pt admitted with above diagnosis. Pt currently with functional limitations due to the deficits listed below (see PT Problem List). PTA pt active and independent. On eval, he required min guard assist bed mobility, min assist transfers, and min guard assist ambulation 200 feet with RW.  Pt will benefit from skilled PT to increase their independence and safety with mobility to allow discharge to the venue listed below.       Follow Up Recommendations Home health PT;Supervision for mobility/OOB (Pt may progress to needing no follow-up services.)    Equipment Recommendations  None recommended by PT    Recommendations for Other Services       Precautions / Restrictions Precautions Precautions: Fall Restrictions Weight Bearing Restrictions: No      Mobility  Bed Mobility Overal bed mobility: Needs Assistance Bed Mobility: Supine to Sit     Supine to sit: Min guard;HOB elevated     General bed mobility comments: +rail, increased time and effort  Transfers Overall transfer level: Needs assistance Equipment used: Rolling walker (2 wheeled) Transfers: Sit to/from Stand Sit to Stand: Min assist         General transfer comment: cues for hand placement, increased time and effort  Ambulation/Gait Ambulation/Gait assistance: Min guard Gait Distance (Feet): 200 Feet Assistive device: Rolling walker (2 wheeled) Gait Pattern/deviations: Step-through pattern;Antalgic;Decreased stride length Gait velocity: decreased Gait velocity interpretation: 1.31 - 2.62 ft/sec,  indicative of limited community ambulator General Gait Details: no physical assist, no LOB  Stairs            Wheelchair Mobility    Modified Rankin (Stroke Patients Only)       Balance Overall balance assessment: Mild deficits observed, not formally tested                                           Pertinent Vitals/Pain Pain Assessment: 0-10 Pain Score: 3  Faces Pain Scale: Hurts even more Pain Location: L groin Pain Descriptors / Indicators: Discomfort;Grimacing;Guarding;Sore Pain Intervention(s): Monitored during session;Repositioned    Home Living Family/patient expects to be discharged to:: Private residence Living Arrangements: Spouse/significant other Available Help at Discharge: Family;Available 24 hours/day Type of Home: House Home Access: Ramped entrance;Stairs to enter Entrance Stairs-Rails: None Entrance Stairs-Number of Steps: 2 Home Layout: One level Home Equipment: Transport chair;Other (comment);Walker - 2 wheels;Bedside commode;Shower seat;Cane - single point      Prior Function Level of Independence: Independent with assistive device(s)         Comments: using walking stick; didn't wear socks due to difficulty donning      Hand Dominance   Dominant Hand: Right    Extremity/Trunk Assessment   Upper Extremity Assessment Upper Extremity Assessment: Overall WFL for tasks assessed    Lower Extremity Assessment Lower Extremity Assessment: LLE deficits/detail LLE Deficits / Details: painful, AROM intact LLE: Unable to fully assess due to pain    Cervical / Trunk Assessment Cervical / Trunk Assessment: Other exceptions Cervical /  Trunk Exceptions: previous ALIF  Communication   Communication: No difficulties  Cognition Arousal/Alertness: Awake/alert Behavior During Therapy: WFL for tasks assessed/performed Overall Cognitive Status: No family/caregiver present to determine baseline cognitive functioning Area of  Impairment: Safety/judgement;Attention                   Current Attention Level: Sustained     Safety/Judgement: Decreased awareness of safety     General Comments: tangential, requires redirection. Loses train of thought frequently. Mildly impulsive.      General Comments General comments (skin integrity, edema, etc.): VSS    Exercises General Exercises - Lower Extremity Mini-Sqauts: 15 reps;Standing;Both;AROM   Assessment/Plan    PT Assessment Patient needs continued PT services  PT Problem List Decreased balance;Pain;Decreased mobility;Decreased knowledge of use of DME;Decreased activity tolerance;Decreased safety awareness       PT Treatment Interventions DME instruction;Functional mobility training;Balance training;Patient/family education;Gait training;Therapeutic activities;Stair training;Therapeutic exercise    PT Goals (Current goals can be found in the Care Plan section)  Acute Rehab PT Goals Patient Stated Goal: independence PT Goal Formulation: With patient Time For Goal Achievement: 03/10/19 Potential to Achieve Goals: Good    Frequency Min 3X/week   Barriers to discharge        Co-evaluation               AM-PAC PT "6 Clicks" Mobility  Outcome Measure Help needed turning from your back to your side while in a flat bed without using bedrails?: A Little Help needed moving from lying on your back to sitting on the side of a flat bed without using bedrails?: A Little Help needed moving to and from a bed to a chair (including a wheelchair)?: A Little Help needed standing up from a chair using your arms (e.g., wheelchair or bedside chair)?: A Little Help needed to walk in hospital room?: A Little Help needed climbing 3-5 steps with a railing? : A Lot 6 Click Score: 17    End of Session Equipment Utilized During Treatment: Gait belt Activity Tolerance: Patient tolerated treatment well Patient left: in chair;with call bell/phone within  reach Nurse Communication: Mobility status PT Visit Diagnosis: Difficulty in walking, not elsewhere classified (R26.2);Pain Pain - Right/Left: Left Pain - part of body: Leg    Time: 1110-1156 PT Time Calculation (min) (ACUTE ONLY): 46 min   Charges:   PT Evaluation $PT Eval Moderate Complexity: 1 Mod PT Treatments $Gait Training: 23-37 mins        Lorrin Goodell, PT  Office # [phone] Pager [phone]   Lorriane Shire 02/24/2019, 12:02 PM

## 2019-02-24 NOTE — Evaluation (Signed)
Occupational Therapy Evaluation Patient Details Name: Joseph Hebert. MRN: [id number] DOB: 1946/07/23 Today's Date: 02/24/2019    History of Present Illness 73 y.o. male with a hx of PAD, dyslipidemia T2 DM, hypertension, CKD, and s/p back sx 01/2017. He had L femoral-popliteal bypass 2016 and left CEA 2016. He was admitted to undergo redo of left femoral to above-knee popliteal bypass.    Clinical Impression   This 73 y/o male presents with the above. At baseline pt reports he is mod independent with ADL and functional mobility using a walking stick. Pt initially hesitant to work with therapy due to increased pain levels, once up and moving pt is able to perform functional mobility using RW with overall minA. He currently requires minguard assist for seated UB ADL, modA for LB ADL secondary to difficulty reaching bil LEs. Pt very tangential during session and requires redirection to tasks at hand. He will benefit from continued acute OT services and recommend follow up [facility] therapy services after discharge to maximize his safety and independence with ADL and mobility. Will follow.     Follow Up Recommendations  Home health OT;Supervision/Assistance - 24 hour    Equipment Recommendations  None recommended by OT(pt's DME needs are met)           Precautions / Restrictions Precautions Precautions: Fall Restrictions Weight Bearing Restrictions: No      Mobility Bed Mobility Overal bed mobility: Needs Assistance Bed Mobility: Supine to Sit     Supine to sit: Min assist;HOB elevated     General bed mobility comments: assist for LLE; increased time/effort due to pain, use of bedrails  Transfers Overall transfer level: Needs assistance Equipment used: Rolling walker (2 wheeled) Transfers: Sit to/from Stand Sit to Stand: Mod assist         General transfer comment: increased boosting assist initially from slightly elevated EOB; educated pt on safe hand placement with pt  continuing to push up on RW; educated on use of UE to control descent into recliner    Balance                                           ADL either performed or assessed with clinical judgement   ADL Overall ADL's : Needs assistance/impaired Eating/Feeding: Modified independent;Sitting   Grooming: Set up;Sitting   Upper Body Bathing: Set up;Sitting   Lower Body Bathing: Moderate assistance;Sit to/from stand   Upper Body Dressing : Set up;Min guard;Sitting   Lower Body Dressing: Moderate assistance;Sit to/from stand Lower Body Dressing Details (indicate cue type and reason): minA standing balance Toilet Transfer: Minimal assistance;Ambulation;RW Toilet Transfer Details (indicate cue type and reason): simulated via transfer to recliner, room/hallway level mobility Toileting- Clothing Manipulation and Hygiene: Minimal assistance;Sit to/from stand       Functional mobility during ADLs: Minimal assistance;Rolling walker General ADL Comments: pt mostly limited due to pain, requires increased safety cues during session      Vision         Perception     Praxis      Pertinent Vitals/Pain Pain Assessment: Faces Faces Pain Scale: Hurts even more Pain Location: LLE, L groin region Pain Descriptors / Indicators: Discomfort;Grimacing;Guarding;Sore Pain Intervention(s): Limited activity within patient's tolerance;Monitored during session;Repositioned     Hand Dominance     Extremity/Trunk Assessment Upper Extremity Assessment Upper Extremity Assessment: Overall WFL for tasks assessed  Lower Extremity Assessment Lower Extremity Assessment: Defer to PT evaluation       Communication Communication Communication: No difficulties   Cognition Arousal/Alertness: Awake/alert Behavior During Therapy: WFL for tasks assessed/performed Overall Cognitive Status: No family/caregiver present to determine baseline cognitive functioning Area of Impairment:  Attention;Memory;Safety/judgement;Awareness                   Current Attention Level: Sustained     Safety/Judgement: Decreased awareness of safety     General Comments: tangential during session and requires redirection; tends to do things his own way despite therapist requests; decreased safety awareness and slightly impulsive - question wheter this may be pt's baseline    General Comments  VSS    Exercises     Shoulder Instructions      Home Living Family/patient expects to be discharged to:: Private residence Living Arrangements: Spouse/significant other Available Help at Discharge: Family Type of Home: House Home Access: Ramped entrance;Stairs to enter Entrance Stairs-Number of Steps: 2   Home Layout: One level     Bathroom Shower/Tub: Tub/shower unit;Walk-in shower(uses walk-in shower)   Bathroom Toilet: Standard     Home Equipment: Transport chair;Other (comment);Walker - 2 wheels;Bedside commode;Shower seat(walking stick)          Prior Functioning/Environment Level of Independence: Independent with assistive device(s)        Comments: using walking stick; didn't wear socks due to difficulty donning         OT Problem List: Decreased strength;Decreased range of motion;Decreased activity tolerance;Impaired balance (sitting and/or standing);Decreased knowledge of use of DME or AE;Decreased knowledge of precautions;Pain;Decreased safety awareness      OT Treatment/Interventions: Self-care/ADL training;Therapeutic exercise;Neuromuscular education;DME and/or AE instruction;Therapeutic activities;Patient/family education;Balance training;Cognitive remediation/compensation    OT Goals(Current goals can be found in the care plan section) Acute Rehab OT Goals Patient Stated Goal: return home as independently as possible OT Goal Formulation: With patient Time For Goal Achievement: 03/10/19 Potential to Achieve Goals: Good  OT Frequency: Min 2X/week    Barriers to D/C:            Co-evaluation              AM-PAC OT "6 Clicks" Daily Activity     Outcome Measure Help from another person eating meals?: None Help from another person taking care of personal grooming?: A Little(in standing) Help from another person toileting, which includes using toliet, bedpan, or urinal?: A Little Help from another person bathing (including washing, rinsing, drying)?: A Little Help from another person to put on and taking off regular upper body clothing?: None Help from another person to put on and taking off regular lower body clothing?: A Lot 6 Click Score: 19   End of Session Equipment Utilized During Treatment: Gait belt;Rolling walker Nurse Communication: Mobility status  Activity Tolerance: Patient tolerated treatment well Patient left: in chair;with call bell/phone within reach;with chair alarm set  OT Visit Diagnosis: Other abnormalities of gait and mobility (R26.89);Pain Pain - Right/Left: Left Pain - part of body: Leg(groin)                Time: 4585-9292 OT Time Calculation (min): 39 min Charges:  OT General Charges $OT Visit: 1 Visit OT Evaluation $OT Eval Moderate Complexity: 1 Mod OT Treatments $Self Care/Home Management : 8-22 mins  Lou Cal, OT [facility] Pager [phone] Office [place] 02/24/2019, 10:37 AM

## 2019-02-24 NOTE — Progress Notes (Addendum)
  Progress Note    02/24/2019 7:32 AM 1 Day Post-Op  Subjective:  Soreness at incision sites   Vitals:   02/23/19 2300 02/24/19 0436  BP: (!) 158/72 (!) 156/82  Pulse: 73 76  Resp: 19 18  Temp:  98.3 F (36.8 C)  SpO2: 94% 93%   Physical Exam: Lungs:  Non labored Incisions:  L groin and L AK popliteal incisions c/d/i Extremities:  Brisk L DP and PT signals by doppler Abdomen:  Soft Neurologic: A&O  CBC    Component Value Date/Time   WBC 11.7 (H) 02/24/2019 0253   RBC 3.45 (L) 02/24/2019 0253   HGB 10.0 (L) 02/24/2019 0253   HCT 31.7 (L) 02/24/2019 0253   PLT 253 02/24/2019 0253   MCV 91.9 02/24/2019 0253   MCH 29.0 02/24/2019 0253   MCHC 31.5 02/24/2019 0253   RDW 14.5 02/24/2019 0253   LYMPHSABS 0.8 01/27/2017 0536   MONOABS 1.1 (H) 01/27/2017 0536   EOSABS 0.2 01/27/2017 0536   BASOSABS 0.0 01/27/2017 0536    BMET    Component Value Date/Time   NA 135 02/24/2019 0253   K 4.8 02/24/2019 0253   CL 107 02/24/2019 0253   CO2 19 (L) 02/24/2019 0253   GLUCOSE 191 (H) 02/24/2019 0253   BUN 35 (H) 02/24/2019 0253   CREATININE 2.64 (H) 02/24/2019 0253   CALCIUM 8.6 (L) 02/24/2019 0253   GFRNONAA 23 (L) 02/24/2019 0253   GFRAA 27 (L) 02/24/2019 0253    INR    Component Value Date/Time   INR 1.0 02/23/2019 0757     Intake/Output Summary (Last 24 hours) at 02/24/2019 0732 Last data filed at 02/24/2019 0615 Gross per 24 hour  Intake 2840.28 ml  Output 930 ml  Net 1910.28 ml     Assessment/Plan:  73 y.o. male is s/p Redo L femoral to AK popliteal bypass 1 Day Post-Op   Brisk L DP and PT signals by doppler Incisions unremarkable Ambulate patient today Possible d/c home tomorrow   Dagoberto Ligas, PA-C [facility] [phone] 02/24/2019 7:32 AM  Agree with above Continue to mobilize probably Home Thursday  Ruta Hinds, MD [facility] [place] Office: [phone] Pager: [phone]

## 2019-02-25 ENCOUNTER — Other Ambulatory Visit: Payer: Self-pay

## 2019-02-25 LAB — BASIC METABOLIC PANEL
Anion gap: 8 (ref 5–15)
BUN: 37 mg/dL — ABNORMAL HIGH (ref 8–23)
CO2: 21 mmol/L — ABNORMAL LOW (ref 22–32)
Calcium: 8.8 mg/dL — ABNORMAL LOW (ref 8.9–10.3)
Chloride: 111 mmol/L (ref 98–111)
Creatinine, Ser: 2.52 mg/dL — ABNORMAL HIGH (ref 0.61–1.24)
GFR calc Af Amer: 28 mL/min — ABNORMAL LOW (ref >60)
GFR calc non Af Amer: 24 mL/min — ABNORMAL LOW (ref >60)
Glucose, Bld: 129 mg/dL — ABNORMAL HIGH (ref 70–99)
POTASSIUM: 3.9 mmol/L (ref 3.5–5.1)
Sodium: 140 mmol/L (ref 135–145)

## 2019-02-25 LAB — GLUCOSE, CAPILLARY
Glucose-Capillary: 94 mg/dL (ref 70–99)
Glucose-Capillary: 96 mg/dL (ref 70–99)

## 2019-02-25 LAB — CBC
HCT: 30.4 % — ABNORMAL LOW (ref 39.0–52.0)
Hemoglobin: 9.7 g/dL — ABNORMAL LOW (ref 13.0–17.0)
MCH: 29.2 pg (ref 26.0–34.0)
MCHC: 31.9 g/dL (ref 30.0–36.0)
MCV: 91.6 fL (ref 80.0–100.0)
NRBC: 0 % (ref 0.0–0.2)
Platelets: 258 10*3/uL (ref 150–400)
RBC: 3.32 MIL/uL — AB (ref 4.22–5.81)
RDW: 14.6 % (ref 11.5–15.5)
WBC: 14 10*3/uL — ABNORMAL HIGH (ref 4.0–10.5)

## 2019-02-25 NOTE — TOC Initial Note (Signed)
Transition of Care (TOC) - Initial/Assessment Note    Patient Details  Name: Joseph Hebert. MRN: [id number] Date of Birth: 12-02-1946  Transition of Care [facility]) CM/SW Contact:    Joseph Hebert, [place] Phone Number: 02/25/2019, 4:21 PM  Clinical Narrative:    Patient admitted fors/p redo L femoral to AK popliteal bypass. CSW visited with the patient at bedside. Patient lives home with his wife. Patient is recommended for Home health care services. CSW provided the patient with medicare.gov HH list, per patient's choice he selected [facility]. CSW contacted [facility] and they were unable to accept patient. Patient requested to contact [facility], they have accepted the referral. Patient will discharge with [facility].                Expected Discharge Plan: [place] Barriers to Discharge: No Barriers Identified   Patient Goals and CMS Choice Patient states their goals for this hospitalization and ongoing recovery are:: [place]  CMS Medicare.gov Compare Post Acute Care list provided to:: Patient Choice offered to / list presented to : Patient  Expected Discharge Plan and Services Expected Discharge Plan: [place] Discharge Planning Services: CM Consult Post Acute Care Choice: [place] arrangements for the past 2 months: Single Family Home                 DME Arranged: N/A DME Agency: NA HH Arranged: PT [place] Agency: [facility]  Prior Living Arrangements/Services Living arrangements for the past 2 months: Single Family Home Lives with:: Spouse Patient language and need for interpreter reviewed:: No Do you feel safe going back to the place where you live?: Yes      Need for Family Participation in Patient Care: Yes (Comment) Care giver support system in place?: Yes (comment)   Criminal Activity/Legal Involvement Pertinent to Current Situation/Hospitalization: No - Comment as  needed  Activities of Daily Living Home Assistive Devices/Equipment: Cane (specify quad or straight), Eyeglasses, Wheelchair, Shower chair with back, Raised toilet seat with rails ADL Screening (condition at time of admission) Patient's cognitive ability adequate to safely complete daily activities?: Yes Is the patient deaf or have difficulty hearing?: No Does the patient have difficulty seeing, even when wearing glasses/contacts?: No Does the patient have difficulty concentrating, remembering, or making decisions?: No Patient able to express need for assistance with ADLs?: Yes Does the patient have difficulty dressing or bathing?: No Independently performs ADLs?: Yes (appropriate for developmental age) Does the patient have difficulty walking or climbing stairs?: Yes Weakness of Legs: Left Weakness of Arms/Hands: None  Permission Sought/Granted Permission sought to share information with : Case Manager, Family Supports Permission granted to share information with : Yes, Verbal Permission Granted  Share Information with NAME: Joseph, Hebert  Permission granted to share info w AGENCY: HH  Permission granted to share info w Relationship: spouse  Permission granted to share info w Contact Information: [phone]  Emotional Assessment Appearance:: Appears stated age Attitude/Demeanor/Rapport: Self-Confident, Engaged Affect (typically observed): Accepting, Apprehensive Orientation: : Oriented to Self, Oriented to Place, Oriented to  Time, Oriented to Situation Alcohol / Substance Use: Tobacco Use Psych Involvement: No (comment)  Admission diagnosis:  PERIPHERAL ARTERY DISEASE Patient Active Problem List   Diagnosis Date Noted  . Hypertension 01/01/2019  . Hyperlipidemia 01/01/2019  . PAD (peripheral artery disease) ([place]) 01/01/2019  . Acute pulmonary edema (HCC)   . Acute hypoxemic respiratory failure ([place])   .  HCAP (healthcare-associated pneumonia)   . Acute respiratory failure  with hypoxia ([place])   . Acute encephalopathy   . Lumbosacral spondylosis with radiculopathy 02/17/2016  . Intermittent claudication ([place]) 07/18/2015  . CKD (chronic kidney disease) stage 3, GFR 30-59 ml/min (HCC) 07/18/2015  . Preoperative cardiovascular examination 07/17/2015  . Bilateral leg pain 06/16/2015  . Status post intraocular lens implant 12/08/2014  . Serous retinal detachment of left eye 12/08/2014  . Nevus of choroid of left eye 12/08/2014  . Aftercare following surgery of the circulatory system, [place] 01/01/2014  . Occlusion and stenosis of carotid artery without mention of cerebral infarction 01/01/2013   PCP:  Joseph Dress, MD Pharmacy:   [facility], [place] [address]. [place] [address] Phone: [phone] Fax: [phone]     Social Determinants of Health (SDOH) Interventions    Readmission Risk Interventions 30 Day Unplanned Readmission Risk Score     Admission (Current) from 02/23/2019 in [facility] (2-Rh) 4E CV SURGICAL PROGRESSIVE CARE  30 Day Unplanned Readmission Risk Score (%)  14 Filed at 02/25/2019 1600     This score is the patient's risk of an unplanned readmission within 30 days of being discharged (0 -100%). The score is based on dignosis, age, lab data, medications, orders, and past utilization.   Low:  0-14.9   Medium: 15-21.9   High: 22-29.9   Extreme: 30 and above       No flowsheet data found.

## 2019-02-25 NOTE — Progress Notes (Signed)
Occupational Therapy Treatment Patient Details Name: Joseph Hebert. MRN: [id number] DOB: 11-Jun-1946 Today's Date: 02/25/2019    History of present illness 73 y.o. male with a hx of PAD, dyslipidemia T2 DM, hypertension, CKD, and s/p back sx 01/2017. He had L femoral-popliteal bypass 2016 and left CEA 2016. He was admitted to undergo redo of left femoral to above-knee popliteal bypass.    OT comments  Pt ambulated to bathroom for attempted BM with cane and min guard assistance. Placed 3 in 1 over toilet. Stood at sink for grooming with min guard assistance. Pt encouraged to use his AE for LB ADL at home, but more likely to rely on his wife. Remained up in chair at end of session with RN providing options for pain in his knee.  Follow Up Recommendations  Home health OT;Supervision/Assistance - 24 hour    Equipment Recommendations  None recommended by OT    Recommendations for Other Services      Precautions / Restrictions Precautions Precautions: Fall Restrictions Weight Bearing Restrictions: No       Mobility Bed Mobility Overal bed mobility: Needs Assistance Bed Mobility: Supine to Sit     Supine to sit: Modified independent (Device/Increase time)     General bed mobility comments: +rail, increased time and effort  Transfers Overall transfer level: Needs assistance Equipment used: Straight cane Transfers: Sit to/from Stand Sit to Stand: Min guard         General transfer comment: increased time, decreased control of descent to chair    Balance Overall balance assessment: Mild deficits observed, not formally tested                                         ADL either performed or assessed with clinical judgement   ADL Overall ADL's : Needs assistance/impaired     Grooming: Wash/dry hands;Standing;Min guard       Lower Body Bathing: Minimal assistance;Sit to/from stand Lower Body Bathing Details (indicate cue type and reason): recommended  pt use his long handled bath sponge and reacher Upper Body Dressing : Set up;Sitting   Lower Body Dressing: Minimal assistance;Sit to/from stand Lower Body Dressing Details (indicate cue type and reason): instructed to start pants over operated leg first and to use reacher and sock aide at home Toilet Transfer: Set up;Ambulation;BSC(cane) Toilet Transfer Details (indicate cue type and reason): 3 in 1 over toilet Toileting- Clothing Manipulation and Hygiene: Supervision/safety;Sit to/from stand       Functional mobility during ADLs: Min guard;Cane       Vision       Perception     Praxis      Cognition Arousal/Alertness: Awake/alert Behavior During Therapy: WFL for tasks assessed/performed Overall Cognitive Status: No family/caregiver present to determine baseline cognitive functioning Area of Impairment: Safety/judgement                         Safety/Judgement: Decreased awareness of safety     General Comments: impaired hearing         Exercises     Shoulder Instructions       General Comments      Pertinent Vitals/ Pain       Pain Assessment: Faces Faces Pain Scale: Hurts even more Pain Location: L knee Pain Descriptors / Indicators: Discomfort;Grimacing;Guarding;Sore Pain Intervention(s): Patient requesting pain meds-RN notified;Repositioned;Monitored during session  Home Living                                          Prior Functioning/Environment              Frequency  Min 2X/week        Progress Toward Goals  OT Goals(current goals can now be found in the care plan section)  Progress towards OT goals: Progressing toward goals  Acute Rehab OT Goals Patient Stated Goal: to walk out of the hospital without a device OT Goal Formulation: With patient Time For Goal Achievement: 03/10/19 Potential to Achieve Goals: Good  Plan Discharge plan remains appropriate    Co-evaluation                 AM-PAC  OT "6 Clicks" Daily Activity     Outcome Measure   Help from another person eating meals?: None Help from another person taking care of personal grooming?: A Little Help from another person toileting, which includes using toliet, bedpan, or urinal?: A Little Help from another person bathing (including washing, rinsing, drying)?: A Little Help from another person to put on and taking off regular upper body clothing?: None Help from another person to put on and taking off regular lower body clothing?: A Little 6 Click Score: 20    End of Session Equipment Utilized During Treatment: Gait belt(cane)  OT Visit Diagnosis: Other abnormalities of gait and mobility (R26.89);Pain Pain - Right/Left: Left Pain - part of body: Knee   Activity Tolerance Patient tolerated treatment well   Patient Left in chair;with call bell/phone within reach;with chair alarm set;with nursing/sitter in room   Nurse Communication Patient requests pain meds        Time: 2683-4196 OT Time Calculation (min): 25 min  Charges: OT General Charges $OT Visit: 1 Visit OT Treatments $Self Care/Home Management : 23-37 mins  Nestor Lewandowsky, OTR/L Acute Rehabilitation Services Pager: [phone] Office: [phone]   Malka So 02/25/2019, 11:03 AM

## 2019-02-25 NOTE — Plan of Care (Signed)
  Problem: Education: Goal: Knowledge of General Education information will improve Description Including pain rating scale, medication(s)/side effects and non-pharmacologic comfort measures Outcome: Progressing   Problem: Health Behavior/Discharge Planning: Goal: Ability to manage health-related needs will improve Outcome: Progressing   

## 2019-02-25 NOTE — Progress Notes (Addendum)
  Progress Note    02/25/2019 7:34 AM 2 Days Post-Op  Subjective:  Soreness at incisions.  No new complaints.   Vitals:   02/25/19 0612 02/25/19 0649  BP: (!) 162/75 (!) 156/71  Pulse:    Resp: 20 (!) 25  Temp:    SpO2:     Physical Exam: Lungs:  Non labored Incisions:  L groin and popliteal incisions c/d/i Extremities:  Palpable L DP Abdomen:  Soft Neurologic: A&O  CBC    Component Value Date/Time   WBC 14.0 (H) 02/25/2019 0311   RBC 3.32 (L) 02/25/2019 0311   HGB 9.7 (L) 02/25/2019 0311   HCT 30.4 (L) 02/25/2019 0311   PLT 258 02/25/2019 0311   MCV 91.6 02/25/2019 0311   MCH 29.2 02/25/2019 0311   MCHC 31.9 02/25/2019 0311   RDW 14.6 02/25/2019 0311   LYMPHSABS 0.8 01/27/2017 0536   MONOABS 1.1 (H) 01/27/2017 0536   EOSABS 0.2 01/27/2017 0536   BASOSABS 0.0 01/27/2017 0536    BMET    Component Value Date/Time   NA 135 02/24/2019 0253   K 4.8 02/24/2019 0253   CL 107 02/24/2019 0253   CO2 19 (L) 02/24/2019 0253   GLUCOSE 191 (H) 02/24/2019 0253   BUN 35 (H) 02/24/2019 0253   CREATININE 2.64 (H) 02/24/2019 0253   CALCIUM 8.6 (L) 02/24/2019 0253   GFRNONAA 23 (L) 02/24/2019 0253   GFRAA 27 (L) 02/24/2019 0253    INR    Component Value Date/Time   INR 1.0 02/23/2019 0757     Intake/Output Summary (Last 24 hours) at 02/25/2019 0734 Last data filed at 02/25/2019 5797 Gross per 24 hour  Intake 990 ml  Output 1775 ml  Net -785 ml     Assessment/Plan:  73 y.o. male is s/p redo L femoral to AK popliteal bypass with PTFE 2 Days Post-Op   Palpable L DP pulse Incisions unremarkable Case manager consulted for [facility] PT [place] d/c home tomorrow  Dagoberto Ligas, PA-C [facility] [phone] 02/25/2019 7:34 AM  2+ DP pulse incisions clean D/c home tomorrow  Ruta Hinds, MD [facility] [place] Office: [phone] Pager: [phone]

## 2019-02-25 NOTE — Progress Notes (Signed)
Physical Therapy Treatment Patient Details Name: Joseph Hebert. MRN: [id number] DOB: [birth year] Today's Date: 02/25/2019    History of Present Illness Pt is a 73 y.o. male admitted 02/23/19 for redo of L femoral to above-knee popliteal bypass graft (had initial bypass graft and L CEA in 2016). Other PMH includes PAD, DM2, HTN, CKD, back sx (01/2017).   PT Comments    Pt is progressing well with mobility. Ambulatory without device and intermittent min guard for balance. Discussed recommendation for use of pt's SPC for added stability. Pt adamantly stating, "I am not leaving until I can walk out of here without a device."    Follow Up Recommendations  Home health PT;Supervision for mobility/OOB     Equipment Recommendations  None recommended by PT    Recommendations for Other Services       Precautions / Restrictions Precautions Precautions: Fall Restrictions Weight Bearing Restrictions: No    Mobility  Bed Mobility               General bed mobility comments: Received sitting in recliner  Transfers Overall transfer level: Needs assistance Equipment used: None Transfers: Sit to/from Stand Sit to Stand: Supervision            Ambulation/Gait Ambulation/Gait assistance: Min guard;Supervision Gait Distance (Feet): 400 Feet Assistive device: None Gait Pattern/deviations: Step-through pattern;Shuffle;Wide base of support Gait velocity: Decreased Gait velocity interpretation: 1.31 - 2.62 ft/sec, indicative of limited community ambulator General Gait Details: Initial slow, shuffling gait without DME and intermittent min guard for balance. Stability improved when pt increasing stride length and narrowing BOS. Discussed use of pt's SPC for added stability; pt adamant "I'm not leaving until I can walk out of here without my stick"   Stairs             Wheelchair Mobility    Modified Rankin (Stroke Patients Only)       Balance Overall balance  assessment: Needs assistance   Sitting balance-Leahy Scale: Good       Standing balance-Leahy Scale: Good               High level balance activites: Direction changes;Turns;Head turns High Level Balance Comments: Min guard with higher level balance tasks during ambulation without device            Cognition Arousal/Alertness: Awake/alert Behavior During Therapy: WFL for tasks assessed/performed Overall Cognitive Status: Within Functional Limits for tasks assessed                                        Exercises      General Comments        Pertinent Vitals/Pain Pain Assessment: Faces Faces Pain Scale: Hurts a little bit Pain Location: L hip Pain Descriptors / Indicators: Sore Pain Intervention(s): Limited activity within patient's tolerance    Home Living                      Prior Function            PT Goals (current goals can now be found in the care plan section) Acute Rehab PT Goals Patient Stated Goal: to walk out of the hospital without a device PT Goal Formulation: With patient Time For Goal Achievement: 03/10/19 Potential to Achieve Goals: Good Progress towards PT goals: Progressing toward goals    Frequency    Min 3X/week  PT Plan Current plan remains appropriate    Co-evaluation              AM-PAC PT "6 Clicks" Mobility   Outcome Measure  Help needed turning from your back to your side while in a flat bed without using bedrails?: None Help needed moving from lying on your back to sitting on the side of a flat bed without using bedrails?: A Little Help needed moving to and from a bed to a chair (including a wheelchair)?: A Little Help needed standing up from a chair using your arms (e.g., wheelchair or bedside chair)?: A Little Help needed to walk in hospital room?: A Little Help needed climbing 3-5 steps with a railing? : A Little 6 Click Score: 19    End of Session Equipment Utilized  During Treatment: Gait belt Activity Tolerance: Patient tolerated treatment well Patient left: in chair;with call bell/phone within reach Nurse Communication: Mobility status PT Visit Diagnosis: Difficulty in walking, not elsewhere classified (R26.2);Pain Pain - Right/Left: Left Pain - part of body: Leg     Time: 1410-1433 PT Time Calculation (min) (ACUTE ONLY): 23 min  Charges:  $Gait Training: 8-22 mins $Therapeutic Activity: 8-22 mins                    Mabeline Caras, PT, DPT Acute Rehabilitation Services  Pager [phone] Office [place] 02/25/2019, 4:05 PM

## 2019-02-26 ENCOUNTER — Telehealth: Payer: Self-pay | Admitting: Vascular Surgery

## 2019-02-26 ENCOUNTER — Encounter (HOSPITAL_COMMUNITY): Payer: Self-pay | Admitting: Vascular Surgery

## 2019-02-26 LAB — BASIC METABOLIC PANEL
Anion gap: 7 (ref 5–15)
BUN: 39 mg/dL — ABNORMAL HIGH (ref 8–23)
CO2: 22 mmol/L (ref 22–32)
Calcium: 8.7 mg/dL — ABNORMAL LOW (ref 8.9–10.3)
Chloride: 109 mmol/L (ref 98–111)
Creatinine, Ser: 2.28 mg/dL — ABNORMAL HIGH (ref 0.61–1.24)
GFR calc Af Amer: 32 mL/min — ABNORMAL LOW (ref >60)
GFR calc non Af Amer: 27 mL/min — ABNORMAL LOW (ref >60)
Glucose, Bld: 112 mg/dL — ABNORMAL HIGH (ref 70–99)
POTASSIUM: 4.2 mmol/L (ref 3.5–5.1)
Sodium: 138 mmol/L (ref 135–145)

## 2019-02-26 MED ORDER — OXYCODONE-ACETAMINOPHEN 5-325 MG PO TABS: 1.0000 | ORAL_TABLET | ORAL | 0 refills | Status: DC | PRN

## 2019-02-26 MED FILL — OXYCODONE W/APAP 5/325 TAB: 5-325 | 5 days supply | Qty: 20 | Fill #0

## 2019-02-26 NOTE — TOC Transition Note (Signed)
Transition of Care [facility]) - CM/SW Discharge Note   Patient Details  Name: Joseph Hebert. MRN: [id number] Date of Birth: May 14, 1946  Transition of Care [facility]) CM/SW Contact:  Dawayne [name], RN Phone Number: 02/26/2019, 10:54 AM   Clinical Narrative:       Final next level of care: Home w Home Health Services Barriers to Discharge: No Barriers Identified   Patient Goals and CMS Choice Patient states their goals for this hospitalization and ongoing recovery are:: [place]  CMS Medicare.gov Compare Post Acute Care list provided to:: Patient Choice offered to / list presented to : Patient  Discharge Placement  Pt going home with wife- [facility] services arranged with [facility]                     Discharge Plan and Services Discharge Planning Services: CM Consult Post Acute Care Choice: Home Health          DME Arranged: N/A DME Agency: NA HH Arranged: PT [place] Agency: [facility]   Social Determinants of Health (SDOH) Interventions     Readmission Risk Interventions No flowsheet data found.

## 2019-02-26 NOTE — Discharge Summary (Signed)
Physician Discharge Summary   Patient ID: Joseph Hebert [id number] 73 y.o. 1946/01/10  Admit date: 02/23/2019  Discharge date and time: 02/26/2019 12:07 PM   Admitting Physician: [name], MD   Discharge Physician: same  Admission Diagnoses: PERIPHERAL ARTERY DISEASE  Discharge Diagnoses: same  Admission Condition: fair  Discharged Condition: fair  Indication for Admission: Debilitating claudication  Hospital Course: [name] is a 73 year old male who was brought in as an outpatient on 02/23/2023 redo left femoral to above-the-knee popliteal bypass with PTFE due to debilitating claudication.  He tolerated the procedure well and was admitted to the hospital postoperatively.  His postoperative course was uneventful.  Postoperative course consisted of pain control as well as increasing mobility.  Physical therapy and Occupational Therapy were consulted and recommendation was made for home health physical therapy.  Case manager was consulted for assistance in arranging home health PT.  At the time of discharge patient had a palpable left DP pulse.  He will be prescribed 2 to 3 days of narcotic pain medication for continued postoperative pain control.  He will follow-up with Dr. [name] in about 2 weeks.  I reviewed discharge instructions with the patient.  He will be discharged this afternoon with his wife to home with home health in stable condition.  Consults: None  Treatments: surgery: Redo left femoral to above-the-knee popliteal artery with PTFE graft on 02/23/2019  Discharge Exam: See progress note 02/26/2019 Vitals:   02/25/19 1928 02/26/19 0534  BP: (!) 158/84 (!) 146/73  Pulse: 81   Resp: 20 11  Temp: 97.9 F (36.6 C) 97.9 F (36.6 C)  SpO2: 94% 96%    Disposition: Discharge disposition: 01-Home or Self Care       - For [facility] Registry use ---  Post-op:  Wound infection: No  Graft infection: No  Transfusion: No  New Arrhythmia: No Patency judged  by: [ ]  Dopper only, [ ]  Palpable graft pulse, [x ] Palpable distal pulse, [ ]  ABI inc. > 0.15, [ ]  Duplex D/C Ambulatory Status: Ambulatory  Complications: MI: [ x] No, [ ]  Troponin only, [ ]  EKG or Clinical CHF: No Resp failure: [ x] none, [ ]  Pneumonia, [ ]  Ventilator Chg in renal function: [x ] none, [ ]  Inc. Cr > 0.5, [ ]  Temp. Dialysis, [ ]  Permanent dialysis Stroke: [x ] None, [ ]  Minor, [ ]  Major Return to OR: No  Reason for return to OR: [ ]  Bleeding, [ ]  Infection, [ ]  Thrombosis, [ ]  Revision  Discharge medications: Statin use:  Yes ASA use:  Yes Plavix use:  No  for medical reason Not indicated Beta blocker use: No  for medical reason Not indicated Coumadin use: No  for medical reason Not indicated    Patient Instructions:  Allergies as of 02/26/2019      Reactions   Lopressor [metoprolol Tartrate] Other (See Comments)   Severe bradycardia      Medication List    TAKE these medications   amLODipine 10 MG tablet Commonly known as:  NORVASC Take 10 mg by mouth daily at 10 pm. (2100)   aspirin EC 81 MG tablet Take 81 mg by mouth daily at 10 pm. (2100)   atorvastatin 80 MG tablet Commonly known as:  LIPITOR Take 80 mg by mouth daily at 10 pm. (2100)   ferrous sulfate 325 (65 FE) MG tablet Take 325 mg by mouth daily at 10 pm. (2100)   lidocaine 5 % Commonly  known as:  LIDODERM Place 1-2 patches onto the skin every 12 (twelve) hours as needed (back pain.).   memantine 10 MG tablet Commonly known as:  NAMENDA Take 10 mg by mouth daily at 10 pm. (2100)   oxyCODONE-acetaminophen 5-325 MG tablet Commonly known as:  PERCOCET/ROXICET Take 1 tablet by mouth every 4 (four) hours as needed for moderate pain.   pantoprazole 40 MG tablet Commonly known as:  PROTONIX Take 40 mg by mouth daily at 10 pm. (2100)   Uloric 40 MG tablet Generic drug:  febuxostat Take 40 mg by mouth daily at 10 pm. (2100)      Activity: activity as tolerated Diet: regular  diet Wound Care: keep wound clean and dry  Follow-up with Dr. [name] in 2 weeks.  SignedDagoberto Ligas 02/26/2019 2:02 PM

## 2019-02-26 NOTE — Progress Notes (Addendum)
  Progress Note    02/26/2019 7:23 AM 3 Days Post-Op   Subjective:  Leg feels better with ambulation since surgery.  Patient says he called his wife to be at the hospital at 12:00 sharp for discharge home    Vitals:   02/25/19 1928 02/26/19 0534  BP: (!) 158/84 (!) 146/73  Pulse: 81   Resp: 20 11  Temp: 97.9 F (36.6 C) 97.9 F (36.6 C)  SpO2: 94% 96%   Physical Exam: Lungs:  Non labored Incisions:  L groin and popliteal incisions c/d/i Extremities:  Palpable L DP Abdomen:  Soft Neurologic: A&O  CBC    Component Value Date/Time   WBC 14.0 (H) 02/25/2019 0311   RBC 3.32 (L) 02/25/2019 0311   HGB 9.7 (L) 02/25/2019 0311   HCT 30.4 (L) 02/25/2019 0311   PLT 258 02/25/2019 0311   MCV 91.6 02/25/2019 0311   MCH 29.2 02/25/2019 0311   MCHC 31.9 02/25/2019 0311   RDW 14.6 02/25/2019 0311   LYMPHSABS 0.8 01/27/2017 0536   MONOABS 1.1 (H) 01/27/2017 0536   EOSABS 0.2 01/27/2017 0536   BASOSABS 0.0 01/27/2017 0536    BMET    Component Value Date/Time   NA 138 02/26/2019 0408   K 4.2 02/26/2019 0408   CL 109 02/26/2019 0408   CO2 22 02/26/2019 0408   GLUCOSE 112 (H) 02/26/2019 0408   BUN 39 (H) 02/26/2019 0408   CREATININE 2.28 (H) 02/26/2019 0408   CALCIUM 8.7 (L) 02/26/2019 0408   GFRNONAA 27 (L) 02/26/2019 0408   GFRAA 32 (L) 02/26/2019 0408    INR    Component Value Date/Time   INR 1.0 02/23/2019 0757     Intake/Output Summary (Last 24 hours) at 02/26/2019 0723 Last data filed at 02/25/2019 1930 Gross per 24 hour  Intake 960 ml  Output 575 ml  Net 385 ml     Assessment/Plan:  73 y.o. male is s/p redo L femoral to AK popliteal bypass with PTFE 3 Days Post-Op   Palpable L DP Incisions healing well Ok for discharge home today Follow up in office in 2 weeks   Dagoberto Ligas, PA-C [facility] [phone] 02/26/2019 7:23 AM  Agree with above  Dc home  Ruta Hinds, MD [facility] [place]  Office: [phone] Pager: [phone]

## 2019-02-26 NOTE — Telephone Encounter (Signed)
sch appt spk to pt mld ltr 03/12/2019 11am p/o MD

## 2019-02-26 NOTE — Telephone Encounter (Signed)
-----   Message from Dagoberto Ligas, PA-C sent at 02/26/2019  7:29 AM EDT -----  Can you schedule an appt for this pt in 2 weeks with Dr. [name].  PO redo LLE bypass. Thanks, MAtt

## 2019-02-26 NOTE — Discharge Instructions (Signed)
 [facility] [place]  Discharge instructions  Lower Extremity Bypass Surgery  Please refer to the following instruction for your post-procedure care. Your surgeon or physician assistant will discuss any changes with you.  Activity  You are encouraged to walk as much as you can. You can slowly return to normal activities during the month after your surgery. Avoid strenuous activity and heavy lifting until your doctor tells you it's OK. Avoid activities such as vacuuming or swinging a golf club. Do not drive until your doctor give the OK and you are no longer taking prescription pain medications. It is also normal to have difficulty with sleep habits, eating and bowel movement after surgery. These will go away with time.  Bathing/Showering  You may shower after you go home. Do not soak in a bathtub, hot tub, or swim until the incision heals completely.  Incision Care  Clean your incision with mild soap and water. Shower every day. Pat the area dry with a clean towel. You do not need a bandage unless otherwise instructed. Do not apply any ointments or creams to your incision. If you have open wounds you will be instructed how to care for them or a visiting nurse may be arranged for you. If you have staples or sutures along your incision they will be removed at your post-op appointment. You may have skin glue on your incision. Do not peel it off. It will come off on its own in about one week. If you have a great deal of moisture in your groin, use a gauze help keep this area dry.  Diet  Resume your normal diet. There are no special food restrictions following this procedure. A low fat/ low cholesterol diet is recommended for all patients with vascular disease. In order to heal from your surgery, it is CRITICAL to get adequate nutrition. Your body requires vitamins, minerals, and protein. Vegetables are the best source of vitamins and minerals. Vegetables also provide the  perfect balance of protein. Processed food has little nutritional value, so try to avoid this.  Medications  Resume taking all your medications unless your doctor or nurse practitioner tells you not to. If your incision is causing pain, you may take over-the-counter pain relievers such as acetaminophen (Tylenol). If you were prescribed a stronger pain medication, please aware these medication can cause nausea and constipation. Prevent nausea by taking the medication with a snack or meal. Avoid constipation by drinking plenty of fluids and eating foods with high amount of fiber, such as fruits, vegetables, and grains. Take Colase 100 mg (an over-the-counter stool softener) twice a day as needed for constipation. Do not take Tylenol if you are taking prescription pain medications.  Follow Up  Our office will schedule a follow up appointment 2-3 weeks following discharge.  Please call us immediately for any of the following conditions  Severe or worsening pain in your legs or feet while at rest or while walking Increase pain, redness, warmth, or drainage (pus) from your incision site(s) Fever of 101 degree or higher The swelling in your leg with the bypass suddenly worsens and becomes more painful than when you were in the hospital If you have been instructed to feel your graft pulse then you should do so every day. If you can no longer feel this pulse, call the office immediately. Not all patients are given this instruction.  Leg swelling is common after leg bypass surgery.  The swelling should improve over a few months   following surgery. To improve the swelling, you may elevate your legs above the level of your heart while you are sitting or resting. Your surgeon or physician assistant may ask you to apply an ACE wrap or wear compression (TED) stockings to help to reduce swelling.  Reduce your risk of vascular disease  Stop smoking. If you would like help call [facility] at 1-800-QUIT-NOW  (1-800-784-8669) or [facility] at 336-586-4000.  Manage your cholesterol Maintain a desired weight Control your diabetes weight Control your diabetes Keep your blood pressure down  If you have any questions, please call the office at [phone]   

## 2019-03-02 ENCOUNTER — Telehealth: Payer: Self-pay | Admitting: Cardiology

## 2019-03-02 NOTE — Telephone Encounter (Signed)
Hilda Blades from [employer] would like a phone call regarding this patient. She is a Chief Strategy Officer and her number is [phone], ext. [phone]

## 2019-03-02 NOTE — Telephone Encounter (Signed)
Left message for Joseph Hebert to return call to office.

## 2019-03-02 NOTE — Telephone Encounter (Signed)
Hilda Blades from [employer] spoke with patient after being discharged from hospital after recent surgery.  She states that patient indicated to her that his blood pressure has been running high in the afternoons.  Patient reports to Hilda Blades that his systolic blood pressure is running 160.    Will speak to Dr Bettina Gavia for further recommendations.

## 2019-03-02 NOTE — Telephone Encounter (Signed)
Please call him if BP is consistently > 947 systolic start atacand 8 mg day

## 2019-03-03 ENCOUNTER — Telehealth: Payer: Self-pay

## 2019-03-03 MED ORDER — CANDESARTAN CILEXETIL 8 MG PO TABS: 8.0000 mg | ORAL_TABLET | Freq: Every evening | ORAL | 6 refills | Status: DC

## 2019-03-03 NOTE — Addendum Note (Signed)
Addended by: Aleatha Borer on: 03/03/2019 03:27 PM   Modules accepted: Orders

## 2019-03-03 NOTE — Telephone Encounter (Signed)
Spoke with patient regarding elevated blood pressures.  Patient reports having consistent systolic blood pressures great than 160.  He states that he takes amlodipine in the morning and by afternoon his blood pressure "begins to run up."  Patient was advised per Dr Bettina Gavia to start Atacand 8mg  one tablet daily in the evening in addition to amlodipine.  Rx sent to pharmacy as requested per patient.  Patient was advised to continue to monitor home blood pressures, and contact our office with any concerns.   Left message on Hilda Blades at [employer] with instructions and orders for new medications. Advised her to return call to office with any questions or concerns.

## 2019-03-03 NOTE — Telephone Encounter (Signed)
Patient advised to start Atacand 8 mg one tablet daily in the evening as directed per Dr Bettina Gavia.  See other encounter.

## 2019-03-04 ENCOUNTER — Other Ambulatory Visit: Payer: Self-pay | Admitting: *Deleted

## 2019-03-04 ENCOUNTER — Telehealth: Payer: Self-pay | Admitting: Cardiology

## 2019-03-04 MED ORDER — CANDESARTAN CILEXETIL 8 MG PO TABS: 8.0000 mg | ORAL_TABLET | Freq: Every evening | ORAL | 0 refills | Status: DC

## 2019-03-04 NOTE — Telephone Encounter (Signed)
Patient called stating the [facility] called him regarding the prescription that was sent in for him yesterday.  He said the [place] has requested he have Dr Joya Gaskins office call them regarding this prescription because "they are doing things differently now".  The number to be called is [phone] ext [phone]

## 2019-03-04 NOTE — Telephone Encounter (Signed)
[facility] and spoke with Baxter Flattery. She advised that a Rx with Dr Joya Gaskins signature be faxed to [phone]. Rx faxed to [place].

## 2019-03-11 ENCOUNTER — Telehealth: Payer: Self-pay | Admitting: Vascular Surgery

## 2019-03-11 ENCOUNTER — Telehealth (HOSPITAL_COMMUNITY): Payer: Self-pay | Admitting: Rehabilitation

## 2019-03-11 ENCOUNTER — Encounter (HOSPITAL_COMMUNITY): Payer: Self-pay | Admitting: Rehabilitation

## 2019-03-11 NOTE — Telephone Encounter (Signed)
The above patient or their representative was contacted and gave the following answers to these questions:         Do you have any of the following symptoms?  NO  Do  you have any of the following other symptoms? NO   muscle pain         vomiting,        diarrhea        rash         weakness        red eye        abdominal pain         bruising          bruising or bleeding              joint pain           severe headache    Have you been in contact with someone who was or has been sick in the past 2 weeks? NO  Yes                 Unsure                         Unable to assess   Does the person that you were in contact with have any of the following symptoms? NO  Cough         shortness of breath           muscle pain         vomiting,            diarrhea            rash            weakness           fever            red eye           abdominal pain           bruising  or  bleeding                joint pain                severe headache               Have you  or someone you have been in contact with traveled internationally in th last month?         If yes, which countries?   Have you  or someone you have been in contact with traveled outside [place] in th last month?         NO  COMMENTS OR ACTION PLAN FOR THIS PATIENT:

## 2019-03-12 ENCOUNTER — Encounter: Payer: Self-pay | Admitting: Vascular Surgery

## 2019-03-12 ENCOUNTER — Telehealth: Payer: Self-pay

## 2019-03-12 ENCOUNTER — Other Ambulatory Visit: Payer: Self-pay

## 2019-03-12 ENCOUNTER — Ambulatory Visit (INDEPENDENT_AMBULATORY_CARE_PROVIDER_SITE_OTHER): Payer: Self-pay | Admitting: Vascular Surgery

## 2019-03-12 VITALS — BP 121/78 | HR 61 | Temp 97.2°F | Resp 20 | Ht 70.0 in | Wt 188.0 lb

## 2019-03-12 DIAGNOSIS — I739 Peripheral vascular disease, unspecified: Secondary | ICD-10-CM

## 2019-03-12 NOTE — Progress Notes (Signed)
Patient is a 73 year old male who returns for postoperative follow-up today.  He underwent redo left femoral to above-knee popliteal bypass with propaten.  He reports his claudication symptoms have completely resolved.  However, he complains of pain just below his left patella.  He denies any incisional drainage.  He also still complains of pain in his left inguinal area that was evaluated extensively previously to rule out inguinal hernia.  Physical exam:  Vitals:   03/12/19 1110  BP: 121/78  Pulse: 61  Resp: 20  Temp: (!) 97.2 F (36.2 C)  SpO2: 96%  Weight: 188 lb (85.3 kg)  Height: 5\' 10"  (1.778 m)    Extremities: Well-healed left groin and left above-knee popliteal incision 2+ dorsalis pedis and posterior tibial pulse left foot  Assessment: Doing well status post left femoral to above-knee popliteal bypass with patent bypass graft and relief of claudication symptoms.  Not sure of the etiology of his anterior knee pain but hopefully this will improve with time.  Plan: The patient will have a follow-up graft scan and ABIs in 3 months.  He will see our nurse practitioner at that office visit.  Ruta Hinds, MD [facility] [place] Office: [phone] Pager: [phone]

## 2019-03-12 NOTE — Telephone Encounter (Signed)
Losartan 50 mg #90 one p.o. daily refill 3

## 2019-03-12 NOTE — Telephone Encounter (Signed)
Patient called and stated that the [facility] was unable to get the candesartan, but could do Valsartan or Losartan if it could be changed to one of these. The patient would like for you to contact him to make sure this has been changed.

## 2019-03-12 NOTE — Telephone Encounter (Signed)
Please advise. Thanks.  

## 2019-03-13 MED ORDER — LOSARTAN POTASSIUM 50 MG PO TABS: 50.0000 mg | ORAL_TABLET | Freq: Every day | ORAL | 3 refills | Status: DC

## 2019-03-13 NOTE — Telephone Encounter (Signed)
Patient informed that since candesartan is unavailable at this time, he will start taking losartan 50 mg daily. Patient is agreeable and verbalized understanding. Patient requests printed prescription be faxed to the [place] at [phone]. Prescription has been successfully faxed. No further questions.

## 2019-04-08 ENCOUNTER — Other Ambulatory Visit: Payer: Self-pay | Admitting: Nephrology

## 2019-04-08 DIAGNOSIS — N184 Chronic kidney disease, stage 4 (severe): Secondary | ICD-10-CM

## 2019-04-10 ENCOUNTER — Other Ambulatory Visit: Payer: Self-pay

## 2019-04-10 ENCOUNTER — Ambulatory Visit
Admission: RE | Admit: 2019-04-10 | Discharge: 2019-04-10 | Disposition: A | Discharge status: Home / Self Care | Payer: Medicare Other | Source: Ambulatory Visit | Attending: Nephrology | Admitting: Nephrology

## 2019-04-10 DIAGNOSIS — N184 Chronic kidney disease, stage 4 (severe): Secondary | ICD-10-CM

## 2019-05-27 ENCOUNTER — Telehealth: Payer: Self-pay

## 2019-05-27 NOTE — Telephone Encounter (Signed)
Pt called and said that he had an ultrasound with Dr Carolanne Grumbling and they recommended he call and get an appt. He said that he had surgery in May and his leg is severely swollen.   Received note from Dr Carolanne Grumbling office and appt made for pt to be seen.   York Cerise, CMA

## 2019-05-28 ENCOUNTER — Encounter: Payer: Self-pay | Admitting: Vascular Surgery

## 2019-05-28 ENCOUNTER — Other Ambulatory Visit: Payer: Self-pay

## 2019-05-28 ENCOUNTER — Ambulatory Visit (INDEPENDENT_AMBULATORY_CARE_PROVIDER_SITE_OTHER): Payer: Self-pay | Admitting: Vascular Surgery

## 2019-05-28 VITALS — BP 173/83 | HR 74 | Temp 97.9°F | Resp 20 | Ht 70.0 in | Wt 196.8 lb

## 2019-05-28 DIAGNOSIS — I739 Peripheral vascular disease, unspecified: Secondary | ICD-10-CM

## 2019-05-29 NOTE — Progress Notes (Signed)
Patient is a 73 year old male who returns for follow-up today.  He underwent redo left femoral to below-knee popliteal bypass March 2020.  His claudication symptoms have essentially resolved at this point.  However, he is noticed some swelling in his left groin left below-knee incision and entire lower extremity.  His primary care physician recently ordered a DVT ultrasound which was negative.  This showed fluid collections in the below-knee and groin incision.  Patient denies any fever or chills.  He has had no drainage.  Physical exam:  Vitals:   05/28/19 1308  BP: (!) 173/83  Pulse: 74  Resp: 20  Temp: 97.9 F (36.6 C)  SpO2: 97%  Weight: 196 lb 12.8 oz (89.3 kg)  Height: 5\' 10"  (1.778 m)    Left lower extremity: Fluctuant mass below-knee popliteal incision less prominent mass left groin nonpulsatile no surrounding erythema in the leg diffuse edema throughout the left lower extremity  Procedure: Local anesthesia was infiltrated over the below-knee popliteal incision.  Using sterile technique this was then aspirated with a 10 cc syringe.  Approximate cc of straw-colored fluid was removed.  Assessment: Groin and below-knee popliteal seroma.  I discussed the patient that this is not very uncommon after redo operations and most likely is secondary to lymphatic leak.  I discussed with him that most likely this should resolve spontaneously but he may be left with some fluid collections in this area.  We could consider draining this again if it recurs.  However, this would put him at risk of infection every time that we do this.  Hopefully his symptoms will resolve with this initial aspiration.  Plan: The patient has follow-up in late June with graft duplex and ABIs.  We will do a telephone visit as well to see if the symptoms of his swelling have resolved.  If he has any problems before this he will call us sooner.  Ruta Hinds, MD [facility] [place] Office:  [phone] Pager: [phone]

## 2019-06-08 ENCOUNTER — Other Ambulatory Visit: Payer: Self-pay

## 2019-06-08 DIAGNOSIS — I739 Peripheral vascular disease, unspecified: Secondary | ICD-10-CM

## 2019-06-10 ENCOUNTER — Telehealth (HOSPITAL_COMMUNITY): Payer: Self-pay | Admitting: Rehabilitation

## 2019-06-10 NOTE — Telephone Encounter (Signed)

## 2019-06-11 ENCOUNTER — Other Ambulatory Visit: Payer: Self-pay

## 2019-06-11 ENCOUNTER — Ambulatory Visit: Payer: Medicare Other | Admitting: Family

## 2019-06-11 ENCOUNTER — Ambulatory Visit (HOSPITAL_COMMUNITY)
Admission: RE | Admit: 2019-06-11 | Discharge: 2019-06-11 | Disposition: A | Discharge status: Home / Self Care | Payer: Medicare Other | Source: Ambulatory Visit | Attending: Vascular Surgery | Admitting: Vascular Surgery

## 2019-06-11 ENCOUNTER — Ambulatory Visit (INDEPENDENT_AMBULATORY_CARE_PROVIDER_SITE_OTHER): Payer: Medicare Other | Admitting: Vascular Surgery

## 2019-06-11 ENCOUNTER — Encounter: Payer: Self-pay | Admitting: Vascular Surgery

## 2019-06-11 ENCOUNTER — Ambulatory Visit: Payer: Medicare Other | Admitting: Vascular Surgery

## 2019-06-11 ENCOUNTER — Ambulatory Visit (INDEPENDENT_AMBULATORY_CARE_PROVIDER_SITE_OTHER)
Admission: RE | Admit: 2019-06-11 | Discharge: 2019-06-11 | Disposition: A | Discharge status: Home / Self Care | Payer: Medicare Other | Source: Ambulatory Visit | Attending: Vascular Surgery | Admitting: Vascular Surgery

## 2019-06-11 VITALS — BP 152/84 | HR 72 | Temp 97.7°F | Resp 20 | Ht 70.0 in | Wt 197.0 lb

## 2019-06-11 DIAGNOSIS — I739 Peripheral vascular disease, unspecified: Secondary | ICD-10-CM

## 2019-06-11 NOTE — Progress Notes (Signed)
Patient is a 73 year old male who returns for follow-up today.  He underwent redo left femoral to below-knee popliteal bypass with PTFE on March ninth 2020.  On his last office visit a few weeks ago I drained a seroma from his area near his popliteal incision.  He states that the fluid collection has returned.  It is not really bothersome to him other than the fact that he can feel it.  He has no real significant swelling in the left leg compared to the right.  His claudication symptoms have resolved.  He has no erythema or drainage on his incisions.  Physical exam:  Vitals:   06/11/19 1411  BP: (!) 152/84  Pulse: 72  Resp: 20  Temp: 97.7 F (36.5 C)  SpO2: 97%  Weight: 197 lb (89.4 kg)  Height: 5\' 10"  (1.778 m)    Extremities: No palpable pedal pulses fluctuant mass left above-knee popliteal incision groin incision well-healed no erythema  Data: Patient had bilateral ABIs performed today.  Left side has improved from 0.4-0.7.  Right side has gone from 0.99-0.88.  There is diffuse seroma around the entire graft on the left side.  Duplex ultrasound shows that the graft is patent.  Assessment: Patent left lower extremity bypass graft with resolution of claudication symptoms.  Graft seroma most likely Gore-Tex reaction or lymphatic leak.  This involves most of the entire bypass graft.  I discussed with the patient today that hopefully this will resolve with time but as long as it is not bothersome to him I would not consider drainage of this as it most likely would have high rate of recurrence with risk of infection.  Plan: The patient will follow-up in 3 months time with me and have graft duplex and bilateral ABIs repeated at that point.  Ruta Hinds, MD [facility] [place] Office: [phone] Pager: [phone]

## 2019-09-09 ENCOUNTER — Telehealth (HOSPITAL_COMMUNITY): Payer: Self-pay | Admitting: *Deleted

## 2019-09-09 ENCOUNTER — Other Ambulatory Visit: Payer: Self-pay

## 2019-09-09 DIAGNOSIS — I739 Peripheral vascular disease, unspecified: Secondary | ICD-10-CM

## 2019-09-09 NOTE — Telephone Encounter (Signed)
The above patient or their representative was contacted and gave the following answers to these questions:         Do you have any of the following symptoms?n  Fever                    Cough                   Shortness of breath  Do  you have any of the following other symptoms?n    muscle pain         vomiting,        diarrhea        rash         weakness        red eye        abdominal pain         bruising          bruising or bleeding              joint pain           severe headache    Have you been in contact with someone who was or has been sick in the past 2 weeks?n  Yes                 Unsure                         Unable to assess   Does the person that you were in contact with have any of the following symptoms?   Cough         shortness of breath           muscle pain         vomiting,            diarrhea            rash            weakness           fever            red eye           abdominal pain           bruising  or  bleeding                joint pain                severe headache               Have you  or someone you have been in contact with traveled internationally in th last month?         If yes, which countries?   Have you  or someone you have been in contact with traveled outside [place] in th last month?         If yes, which state and city?   COMMENTS OR ACTION PLAN FOR THIS PATIENT:         Question

## 2019-09-10 ENCOUNTER — Ambulatory Visit (INDEPENDENT_AMBULATORY_CARE_PROVIDER_SITE_OTHER): Payer: Medicare Other | Admitting: Vascular Surgery

## 2019-09-10 ENCOUNTER — Other Ambulatory Visit: Payer: Self-pay

## 2019-09-10 ENCOUNTER — Ambulatory Visit (HOSPITAL_COMMUNITY)
Admission: RE | Admit: 2019-09-10 | Discharge: 2019-09-10 | Disposition: A | Discharge status: Home / Self Care | Payer: Medicare Other | Source: Ambulatory Visit | Attending: Vascular Surgery | Admitting: Vascular Surgery

## 2019-09-10 ENCOUNTER — Encounter: Payer: Self-pay | Admitting: Vascular Surgery

## 2019-09-10 ENCOUNTER — Ambulatory Visit (INDEPENDENT_AMBULATORY_CARE_PROVIDER_SITE_OTHER)
Admission: RE | Admit: 2019-09-10 | Discharge: 2019-09-10 | Disposition: A | Discharge status: Home / Self Care | Payer: Medicare Other | Source: Ambulatory Visit | Attending: Vascular Surgery | Admitting: Vascular Surgery

## 2019-09-10 VITALS — BP 147/79 | HR 71 | Temp 97.2°F | Resp 20 | Ht 70.0 in | Wt 200.0 lb

## 2019-09-10 DIAGNOSIS — I739 Peripheral vascular disease, unspecified: Secondary | ICD-10-CM | POA: Diagnosis not present

## 2019-09-10 NOTE — Progress Notes (Signed)
Patient is a 73 year old male who returns for follow-up today.  He underwent redo left femoral to below-knee popliteal bypass with PTFE March 2020.  He did have a seroma in his left thigh at his last office visit.  This has persisted.  It is occasionally bothersome to him but not debilitating.  He does not really describe claudication symptoms at this point.  However he has total body weakness and fatigue when he tries to climb the driveway.  We talked again about smoking cessation and how this may be contributing to his exercise fatigue.  He currently is adamant that he is not going to quit smoking.  He has no open wounds or ulcers.  Review of systems: He has shortness of breath with exertion.  He does not have chest pain.  Social History   Socioeconomic History  . Marital status: Married    Spouse name: Not on file  . Number of children: Not on file  . Years of education: Not on file  . Highest education level: Not on file  Occupational History  . Not on file  Social Needs  . Financial resource strain: Not on file  . Food insecurity    Worry: Not on file    Inability: Not on file  . Transportation needs    Medical: Not on file    Non-medical: Not on file  Tobacco Use  . Smoking status: Current Every Day Smoker    Packs/day: 0.30    Types: Cigarettes  . Smokeless tobacco: Never Used  Substance and Sexual Activity  . Alcohol use: No    Alcohol/week: 0.0 standard drinks  . Drug use: No  . Sexual activity: Yes  Lifestyle  . Physical activity    Days per week: Not on file    Minutes per session: Not on file  . Stress: Not on file  Relationships  . Social Herbalist on phone: Not on file    Gets together: Not on file    Attends religious service: Not on file    Active member of club or organization: Not on file    Attends meetings of clubs or organizations: Not on file    Relationship status: Not on file  . Intimate partner violence    Fear of current or ex  partner: Not on file    Emotionally abused: Not on file    Physically abused: Not on file    Forced sexual activity: Not on file  Other Topics Concern  . Not on file  Social History Narrative  . Not on file    Current Outpatient Medications on File Prior to Visit  Medication Sig Dispense Refill  . amLODipine (NORVASC) 10 MG tablet Take 10 mg by mouth daily at 10 pm. (2100)    . aspirin EC 81 MG tablet Take 81 mg by mouth daily at 10 pm. (2100)    . atorvastatin (LIPITOR) 80 MG tablet Take 80 mg by mouth daily at 10 pm. (2100)    . ferrous sulfate 325 (65 FE) MG tablet Take 325 mg by mouth daily at 10 pm. (2100)    . furosemide (LASIX) 20 MG tablet     . lidocaine (LIDODERM) 5 % Place 1-2 patches onto the skin every 12 (twelve) hours as needed (back pain.).     [name] losartan (COZAAR) 100 MG tablet     . memantine (NAMENDA) 10 MG tablet Take 10 mg by mouth daily at 10 pm. (2100)    .  pantoprazole (PROTONIX) 40 MG tablet Take 40 mg by mouth daily at 10 pm. (2100)    . sodium bicarbonate 650 MG tablet Take 1,300 mg by mouth 2 (two) times daily.    [name] ULORIC 40 MG tablet Take 40 mg by mouth daily at 10 pm. (2100)  5   No current facility-administered medications on file prior to visit.     Physical exam:  Vitals:   09/10/19 1327  BP: (!) 147/79  Pulse: 71  Resp: 20  Temp: (!) 97.2 F (36.2 C)  SpO2: 97%  Weight: 200 lb (90.7 kg)  Height: 5\' 10"  (1.778 m)    Extremities: No palpable pedal pulses fluctuant mass left above-knee popliteal incision no erythema  Data: Patient had bilateral ABIs performed today.  I reviewed and interpreted the study.  Right side was 0.9 left side 0.8 arterial duplex scan showed no graft narrowing at the proximal or distal anastomosis.  Assessment: Doing well status post redo left femoral-popliteal bypass.  Recurrent seroma.  Minimal symptoms at this point.  I have discussed with the patient that I do not think this should be drained unless it is  debilitating to him due to risk of infection.  His total body fatigue is probably overall deconditioning.  I have tried to tell him again today to quit smoking and also try to increase his exercise tolerance by walking a little bit more every day.  He will try to do this in the future.  He is not interested in quitting smoking.  Plan: The patient will follow-up in 1 year with a graft scan and bilateral ABIs.  Ruta Hinds, MD [facility] Office: [phone] Pager: [phone]

## 2019-12-31 DIAGNOSIS — E785 Hyperlipidemia, unspecified: Secondary | ICD-10-CM | POA: Diagnosis not present

## 2019-12-31 DIAGNOSIS — N184 Chronic kidney disease, stage 4 (severe): Secondary | ICD-10-CM | POA: Diagnosis not present

## 2019-12-31 DIAGNOSIS — Z125 Encounter for screening for malignant neoplasm of prostate: Secondary | ICD-10-CM | POA: Diagnosis not present

## 2019-12-31 DIAGNOSIS — E1122 Type 2 diabetes mellitus with diabetic chronic kidney disease: Secondary | ICD-10-CM | POA: Diagnosis not present

## 2019-12-31 DIAGNOSIS — M109 Gout, unspecified: Secondary | ICD-10-CM | POA: Diagnosis not present

## 2019-12-31 DIAGNOSIS — I129 Hypertensive chronic kidney disease with stage 1 through stage 4 chronic kidney disease, or unspecified chronic kidney disease: Secondary | ICD-10-CM | POA: Diagnosis not present

## 2020-03-02 DIAGNOSIS — E1122 Type 2 diabetes mellitus with diabetic chronic kidney disease: Secondary | ICD-10-CM | POA: Diagnosis not present

## 2020-03-02 DIAGNOSIS — N184 Chronic kidney disease, stage 4 (severe): Secondary | ICD-10-CM | POA: Diagnosis not present

## 2020-03-02 DIAGNOSIS — I739 Peripheral vascular disease, unspecified: Secondary | ICD-10-CM | POA: Diagnosis not present

## 2020-03-02 DIAGNOSIS — D631 Anemia in chronic kidney disease: Secondary | ICD-10-CM | POA: Diagnosis not present

## 2020-03-02 DIAGNOSIS — I129 Hypertensive chronic kidney disease with stage 1 through stage 4 chronic kidney disease, or unspecified chronic kidney disease: Secondary | ICD-10-CM | POA: Diagnosis not present

## 2020-03-02 DIAGNOSIS — N189 Chronic kidney disease, unspecified: Secondary | ICD-10-CM | POA: Diagnosis not present

## 2020-03-02 DIAGNOSIS — E785 Hyperlipidemia, unspecified: Secondary | ICD-10-CM | POA: Diagnosis not present

## 2020-03-02 DIAGNOSIS — N179 Acute kidney failure, unspecified: Secondary | ICD-10-CM | POA: Diagnosis not present

## 2020-03-02 DIAGNOSIS — R809 Proteinuria, unspecified: Secondary | ICD-10-CM | POA: Diagnosis not present

## 2020-03-02 DIAGNOSIS — N2581 Secondary hyperparathyroidism of renal origin: Secondary | ICD-10-CM | POA: Diagnosis not present

## 2020-03-30 DIAGNOSIS — E785 Hyperlipidemia, unspecified: Secondary | ICD-10-CM | POA: Diagnosis not present

## 2020-03-30 DIAGNOSIS — M109 Gout, unspecified: Secondary | ICD-10-CM | POA: Diagnosis not present

## 2020-03-30 DIAGNOSIS — E1122 Type 2 diabetes mellitus with diabetic chronic kidney disease: Secondary | ICD-10-CM | POA: Diagnosis not present

## 2020-03-30 DIAGNOSIS — I129 Hypertensive chronic kidney disease with stage 1 through stage 4 chronic kidney disease, or unspecified chronic kidney disease: Secondary | ICD-10-CM | POA: Diagnosis not present

## 2020-03-30 DIAGNOSIS — N184 Chronic kidney disease, stage 4 (severe): Secondary | ICD-10-CM | POA: Diagnosis not present

## 2020-05-04 DIAGNOSIS — H53002 Unspecified amblyopia, left eye: Secondary | ICD-10-CM | POA: Diagnosis not present

## 2020-05-04 DIAGNOSIS — E113293 Type 2 diabetes mellitus with mild nonproliferative diabetic retinopathy without macular edema, bilateral: Secondary | ICD-10-CM | POA: Diagnosis not present

## 2020-05-04 DIAGNOSIS — Z961 Presence of intraocular lens: Secondary | ICD-10-CM | POA: Diagnosis not present

## 2020-05-04 DIAGNOSIS — D3132 Benign neoplasm of left choroid: Secondary | ICD-10-CM | POA: Diagnosis not present

## 2020-05-11 DIAGNOSIS — E113293 Type 2 diabetes mellitus with mild nonproliferative diabetic retinopathy without macular edema, bilateral: Secondary | ICD-10-CM | POA: Diagnosis not present

## 2020-06-06 DIAGNOSIS — E1122 Type 2 diabetes mellitus with diabetic chronic kidney disease: Secondary | ICD-10-CM | POA: Diagnosis not present

## 2020-06-06 DIAGNOSIS — N2581 Secondary hyperparathyroidism of renal origin: Secondary | ICD-10-CM | POA: Diagnosis not present

## 2020-06-06 DIAGNOSIS — I739 Peripheral vascular disease, unspecified: Secondary | ICD-10-CM | POA: Diagnosis not present

## 2020-06-06 DIAGNOSIS — D631 Anemia in chronic kidney disease: Secondary | ICD-10-CM | POA: Diagnosis not present

## 2020-06-06 DIAGNOSIS — I129 Hypertensive chronic kidney disease with stage 1 through stage 4 chronic kidney disease, or unspecified chronic kidney disease: Secondary | ICD-10-CM | POA: Diagnosis not present

## 2020-06-06 DIAGNOSIS — E785 Hyperlipidemia, unspecified: Secondary | ICD-10-CM | POA: Diagnosis not present

## 2020-06-06 DIAGNOSIS — N184 Chronic kidney disease, stage 4 (severe): Secondary | ICD-10-CM | POA: Diagnosis not present

## 2020-06-06 DIAGNOSIS — N189 Chronic kidney disease, unspecified: Secondary | ICD-10-CM | POA: Diagnosis not present

## 2020-06-06 DIAGNOSIS — R809 Proteinuria, unspecified: Secondary | ICD-10-CM | POA: Diagnosis not present

## 2020-06-06 DIAGNOSIS — N179 Acute kidney failure, unspecified: Secondary | ICD-10-CM | POA: Diagnosis not present

## 2020-06-29 DIAGNOSIS — Z125 Encounter for screening for malignant neoplasm of prostate: Secondary | ICD-10-CM | POA: Diagnosis not present

## 2020-06-29 DIAGNOSIS — E1122 Type 2 diabetes mellitus with diabetic chronic kidney disease: Secondary | ICD-10-CM | POA: Diagnosis not present

## 2020-06-29 DIAGNOSIS — M109 Gout, unspecified: Secondary | ICD-10-CM | POA: Diagnosis not present

## 2020-06-29 DIAGNOSIS — Z1331 Encounter for screening for depression: Secondary | ICD-10-CM | POA: Diagnosis not present

## 2020-06-29 DIAGNOSIS — Z9181 History of falling: Secondary | ICD-10-CM | POA: Diagnosis not present

## 2020-06-29 DIAGNOSIS — N184 Chronic kidney disease, stage 4 (severe): Secondary | ICD-10-CM | POA: Diagnosis not present

## 2020-06-29 DIAGNOSIS — E785 Hyperlipidemia, unspecified: Secondary | ICD-10-CM | POA: Diagnosis not present

## 2020-06-29 DIAGNOSIS — R001 Bradycardia, unspecified: Secondary | ICD-10-CM | POA: Diagnosis not present

## 2020-06-29 DIAGNOSIS — Z0000 Encounter for general adult medical examination without abnormal findings: Secondary | ICD-10-CM | POA: Diagnosis not present

## 2020-06-29 DIAGNOSIS — I129 Hypertensive chronic kidney disease with stage 1 through stage 4 chronic kidney disease, or unspecified chronic kidney disease: Secondary | ICD-10-CM | POA: Diagnosis not present

## 2020-07-06 DIAGNOSIS — S61209A Unspecified open wound of unspecified finger without damage to nail, initial encounter: Secondary | ICD-10-CM | POA: Diagnosis not present

## 2020-07-14 ENCOUNTER — Other Ambulatory Visit: Payer: Self-pay

## 2020-07-14 ENCOUNTER — Ambulatory Visit (INDEPENDENT_AMBULATORY_CARE_PROVIDER_SITE_OTHER): Payer: Medicare PPO | Admitting: Cardiology

## 2020-07-14 ENCOUNTER — Encounter: Payer: Self-pay | Admitting: Cardiology

## 2020-07-14 VITALS — BP 180/110 | HR 84 | Ht 70.0 in | Wt 178.0 lb

## 2020-07-14 DIAGNOSIS — I493 Ventricular premature depolarization: Secondary | ICD-10-CM | POA: Diagnosis not present

## 2020-07-14 DIAGNOSIS — I10 Essential (primary) hypertension: Secondary | ICD-10-CM | POA: Diagnosis not present

## 2020-07-14 DIAGNOSIS — E782 Mixed hyperlipidemia: Secondary | ICD-10-CM

## 2020-07-14 DIAGNOSIS — I739 Peripheral vascular disease, unspecified: Secondary | ICD-10-CM | POA: Diagnosis not present

## 2020-07-14 DIAGNOSIS — I491 Atrial premature depolarization: Secondary | ICD-10-CM | POA: Diagnosis not present

## 2020-07-14 HISTORY — DX: Ventricular premature depolarization: I49.3

## 2020-07-14 NOTE — Progress Notes (Signed)
Cardiology Office Note:    Date:  07/14/2020   ID:  Joseph Locks., DOB 22-Aug-1946, MRN [id number]  PCP:  [name], MD  Cardiologist:  No primary care provider on file.  Electrophysiologist:  None   Referring MD: [name], MD   " I am doing well, I was told by Dr. Delena Bali that I have skipped beats"  History of Present Illness:    Joseph Eller. is a 74 y.o. male with a hx of PAD, dyslipidemia T2 DM, hypertension and CKD. Marland KitchenHe had L femoral-popliteal bypass 2016 and left CEA 2016.  For preoperative risk assessment he underwent a stress myocardial perfusion study 01/06/19  Which was normal.  Today the patient comes for a follow-up visit.  He tells me that he did see his primary care provider who did an EKG and told him that he is experiencing skipped beats.  He tells me he does not feel the skipped beats.  He denies any lightheadedness of dizziness.  At that visit I was also concerned that he may have been bradycardic.   Past Medical History:  Diagnosis Date  . Acute encephalopathy   . Acute hypoxemic respiratory failure ([place])   . Acute pulmonary edema (HCC)   . Acute respiratory failure with hypoxia ([place])   . Aftercare following surgery of the circulatory system, [place] 01/01/2014  . Anemia   . Arrhythmia    takes metoprolol daily  . Arthritis   . Asthma   . Bilateral leg pain 06/16/2015  . CAD (coronary artery disease)   . Cardiac asystole ([place])   . Cardiogenic shock ([place])   . Carotid artery occlusion    with Claudication  . Chronic back pain    stenosis  . Chronic kidney disease   . CKD (chronic kidney disease) stage 3, GFR 30-59 ml/min 07/18/2015  . COPD (chronic obstructive pulmonary disease) (HCC)    Spirva daily and Albuterol as needed  . Dementia ([place])   . Depression   . Diabetes mellitus without complication (HCC)    Type 2 diet controlled.Never been on meds  . GERD (gastroesophageal reflux disease)   . Gout    takes Uloric daily  . HCAP  (healthcare-associated pneumonia)   . Head injury    as a child  . History of colon polyps    benign  . History of gastric ulcer   . History of hiatal hernia   . Hyperlipidemia    takes Atorvastatin and Fenofibrate daily  . Hypertension    takes Lisinopril,Amlodipine,and Hydralazine daily  . Impaired memory    takes Namenda daily  . Intermittent claudication ([place]) 07/18/2015   Overview:  Operative management: The patient will be scheduled for a left femoral to  popliteal bypass in the near future after cardiac risk stratification. He  does have some superficial femoral and tibial artery occlusive disease in  the right leg. However his ABI on the right side is greater than 0.9.  Hopefully he will improve with just a walking program the right leg alone.  If not we could c  . Lumbosacral spondylosis with radiculopathy 02/17/2016  . Nevus of choroid of left eye 12/08/2014  . Numbness    lower left leg and upper right thigh  . Occlusion and stenosis of carotid artery without mention of cerebral infarction 01/01/2013  . PAD (peripheral artery disease) ([place])   . Pneumonia    hx of-couple of yrs ago  . Preoperative cardiovascular examination 07/17/2015  Overview:  Echo 2013 with EF 55-60% Lexiscan MPS 11/12/12 with normal perfusion and function  Overview:  Echo 2013 with EF 55-60% Lexiscan MPS 11/12/12 with normal perfusion and function  . Serous retinal detachment of left eye 12/08/2014  . Shortness of breath dyspnea    with exertion  . Status post intraocular lens implant 12/08/2014  . Tuberculosis 2006    9 months   . Vertigo   . Wears glasses     Past Surgical History:  Procedure Laterality Date  . ABDOMINAL AORTOGRAM W/LOWER EXTREMITY N/A 02/20/2019   Procedure: ABDOMINAL AORTOGRAM W/LOWER EXTREMITY;  Surgeon: [name], MD;  Location: [facility];  Service: Cardiovascular;  Laterality: N/A;  . ABDOMINAL EXPOSURE N/A 01/22/2017   Procedure: ABDOMINAL EXPOSURE;  Surgeon:  [name], MD;  Location: [facility];  Service: Vascular;  Laterality: N/A;  . ANTERIOR LAT LUMBAR FUSION N/A 01/22/2017   Procedure: LUMBAR THREE-FOUR, LUMBAR FOUR-FIVE ANTERIOR LATERAL INTERBODY FUSION;  Surgeon: [name], MD;  Location: [facility];  Service: Neurosurgery;  Laterality: N/A;  L3-4 L4-5 Lateral interbody fusion  . ANTERIOR LUMBAR FUSION N/A 01/22/2017   Procedure: LUMBAR FIVE-SACRUM ONE ANTERIOR LUMBAR INTERBODY FUSION WITH ABDOMINAL APPROACH BY DR [name];  Surgeon: [name], MD;  Location: [facility];  Service: Neurosurgery;  Laterality: N/A;  . CAROTID BODY TUMOR EXCISION Left 09/21/2015   Procedure: EXCISE LEFT NECK NODULE WITH LOCAL ;  Surgeon: [name], MD;  Location: [facility];  Service: Vascular;  Laterality: Left;  . CAROTID ENDARTERECTOMY  Nov. 15,2011   LEFT cea  . cataract surgery Bilateral   . COLONOSCOPY    . EYE SURGERY    . FEMORAL-POPLITEAL BYPASS GRAFT Left 08/23/2015   Procedure: LEFT FEMORAL-POPLITEAL ARTERY BYPASS WITH GORETEX GRAFT;  Surgeon: [name], MD;  Location: [facility];  Service: Vascular;  Laterality: Left;  . FEMORAL-POPLITEAL BYPASS GRAFT Left 02/23/2019   Procedure: REDO LEFT FEMORAL TO POPLITEAL ARTERY BYPASS GRAFT Using Propaten graft;  Surgeon: [name], MD;  Location: [facility];  Service: Vascular;  Laterality: Left;  . JOINT REPLACEMENT  1980   RIGHT  knee  . LUMBAR EPIDURAL INJECTION    . LUMBAR LAMINECTOMY/DECOMPRESSION MICRODISCECTOMY Right 02/17/2016   Procedure: Laminectomy and Foraminotomy - Lumbar four-five right;  Surgeon: [name], MD;  Location: [facility];  Service: Neurosurgery;  Laterality: Right;  right  . LUMBAR PERCUTANEOUS PEDICLE SCREW 3 LEVEL N/A 01/22/2017   Procedure: LUMBAR THREE-SACRAL ONE PERCUTANEOUS PEDICLE SCREW FIXATION WITH ROBOTIC ASSISTANCE;  Surgeon: [name], MD;  Location: [facility];  Service: Neurosurgery;  Laterality: N/A;  L3 to S1 Percutaneous pedicle screw  fixation  . PERIPHERAL VASCULAR CATHETERIZATION N/A 06/24/2015   Procedure: Abdominal Aortogram;  Surgeon: [name], MD;  Location: [facility];  Service: Cardiovascular;  Laterality: N/A;  . Stents  Aug.  23, 1999   Bilateral iliofemoral  stents, [place].  . TONSILLECTOMY      Current Medications: Current Meds  Medication Sig  . amLODipine (NORVASC) 10 MG tablet Take 10 mg by mouth daily at 10 pm. (2100)  . aspirin EC 81 MG tablet Take 81 mg by mouth daily at 10 pm. (2100)  . atorvastatin (LIPITOR) 80 MG tablet Take 80 mg by mouth daily at 10 pm. (2100)  . losartan (COZAAR) 100 MG tablet   . ULORIC 40 MG tablet Take 40 mg by mouth daily at 10 pm. (2100)  Allergies:   Lopressor [metoprolol tartrate]   Social History   Socioeconomic History  . Marital status: Married    Spouse name: Not on file  . Number of children: Not on file  . Years of education: Not on file  . Highest education level: Not on file  Occupational History  . Not on file  Tobacco Use  . Smoking status: Current Every Day Smoker    Packs/day: 0.30    Types: Cigarettes  . Smokeless tobacco: Never Used  Vaping Use  . Vaping Use: Never used  Substance and Sexual Activity  . Alcohol use: No    Alcohol/week: 0.0 standard drinks  . Drug use: No  . Sexual activity: Yes  Other Topics Concern  . Not on file  Social History Narrative  . Not on file   Social Determinants of Health   Financial Resource Strain:   . Difficulty of Paying Living Expenses:   Food Insecurity:   . Worried About Charity fundraiser in the Last Year:   . Arboriculturist in the Last Year:   Transportation Needs:   . Film/video editor (Medical):   [name] Lack of Transportation (Non-Medical):   Physical Activity:   . Days of Exercise per Week:   . Minutes of Exercise per Session:   Stress:   . Feeling of Stress :   Social Connections:   . Frequency of Communication with Friends and Family:   . Frequency of Social  Gatherings with Friends and Family:   . Attends Religious Services:   . Active Member of Clubs or Organizations:   . Attends Archivist Meetings:   [name] Marital Status:      Family History: The patient's family history includes Diabetes in his mother; Heart disease in his sister; Hyperlipidemia in his father; Hypertension in his father; Other in his father.  ROS:   Review of Systems  Constitution: Negative for decreased appetite, fever and weight gain.  HENT: Negative for congestion, ear discharge, hoarse voice and sore throat.   Eyes: Negative for discharge, redness, vision loss in right eye and visual halos.  Cardiovascular: Negative for chest pain, dyspnea on exertion, leg swelling, orthopnea and palpitations.  Respiratory: Negative for cough, hemoptysis, shortness of breath and snoring.   Endocrine: Negative for heat intolerance and polyphagia.  Hematologic/Lymphatic: Negative for bleeding problem. Does not bruise/bleed easily.  Skin: Negative for flushing, nail changes, rash and suspicious lesions.  Musculoskeletal: Negative for arthritis, joint pain, muscle cramps, myalgias, neck pain and stiffness.  Gastrointestinal: Negative for abdominal pain, bowel incontinence, diarrhea and excessive appetite.  Genitourinary: Negative for decreased libido, genital sores and incomplete emptying.  Neurological: Negative for brief paralysis, focal weakness, headaches and loss of balance.  Psychiatric/Behavioral: Negative for altered mental status, depression and suicidal ideas.  Allergic/Immunologic: Negative for HIV exposure and persistent infections.    EKGs/Labs/Other Studies Reviewed:    The following studies were reviewed today:   EKG:  The ekg ordered today demonstrates sinus rhythm, heart rate 84 bpm, with premature atrial complexes and premature ventricular complexes.  Recent Labs: No results found for requested labs within last 8760 hours.  Recent Lipid Panel No results  found for: CHOL, TRIG, HDL, CHOLHDL, VLDL, LDLCALC, LDLDIRECT  Physical Exam:    VS:  BP (!) 180/110 (BP Location: Left Arm, Patient Position: Sitting, Cuff Size: Normal)   Pulse 84   Ht 5\' 10"  (1.778 m)   Wt 178 lb (80.7 kg)   SpO2  97%   BMI 25.54 kg/m     Wt Readings from Last 3 Encounters:  07/14/20 178 lb (80.7 kg)  09/10/19 200 lb (90.7 kg)  06/11/19 197 lb (89.4 kg)     GEN: Well nourished, well developed in no acute distress HEENT: Normal NECK: No JVD; No carotid bruits LYMPHATICS: No lymphadenopathy CARDIAC: S1S2 noted,RRR, no murmurs, rubs, gallops RESPIRATORY:  Clear to auscultation without rales, wheezing or rhonchi  ABDOMEN: Soft, non-tender, non-distended, +bowel sounds, no guarding. EXTREMITIES: No edema, No cyanosis, no clubbing MUSCULOSKELETAL:  No deformity  SKIN: Warm and dry NEUROLOGIC:  Alert and oriented x 3, non-focal PSYCHIATRIC:  Normal affect, good insight  ASSESSMENT:    1. Essential hypertension   2. PAD (peripheral artery disease) ([place])   3. Mixed hyperlipidemia   4. PVC (premature ventricular contraction)   5. PAC (premature atrial contraction)    PLAN:     He is hypertensive in the office today have asked the patient to take his medications as prescribed anytime he is coming to see [place].  I have also asked him if he can take his blood pressure daily we will see him back in 2 weeks and determine the need for any antihypertensive medication.  PVCs/PACs the patient does not experience any symptoms of palpitations.  His PVCs are occasional.  We will continue to monitor.  If there is a new we will place a monitor on the patient to assess PACs/PVCs burden.  He is not bradycardic here.  We will continue to monitor  The patient is in agreement with the above plan. The patient left the office in stable condition.  The patient will follow up in 2 weeks with Dr. Bettina Gavia or me   Medication Adjustments/Labs and Tests Ordered: Current medicines are  reviewed at length with the patient today.  Concerns regarding medicines are outlined above.  Orders Placed This Encounter  Procedures  . EKG 12-Lead   No orders of the defined types were placed in this encounter.   Patient Instructions  Medication Instructions:  Your physician recommends that you continue on your current medications as directed. Please refer to the Current Medication list given to you today.  *If you need a refill on your cardiac medications before your next appointment, please call your pharmacy*   Lab Work: None If you have labs (blood work) drawn today and your tests are completely normal, you will receive your results only by: [name] MyChart Message (if you have MyChart) OR . A paper copy in the mail If you have any lab test that is abnormal or we need to change your treatment, we will call you to review the results.   Testing/Procedures: None   Follow-Up: At [facility], you and your health needs are our priority.  As part of our continuing mission to provide you with exceptional heart care, we have created designated Provider Care Teams.  These Care Teams include your primary Cardiologist (physician) and Advanced Practice Providers (APPs -  Physician Assistants and Nurse Practitioners) who all work together to provide you with the care you need, when you need it.  We recommend signing up for the patient portal called "MyChart".  Sign up information is provided on this After Visit Summary.  MyChart is used to connect with patients for Virtual Visits (Telemedicine).  Patients are able to view lab/test results, encounter notes, upcoming appointments, etc.  Non-urgent messages can be sent to your provider as well.   To learn more about what you  can do with MyChart, go to [email or web address].    Your next appointment:   2 week(s)  The format for your next appointment:   In Person  Provider:   Berniece Salines, DO   Other Instructions      Adopting a  Healthy Lifestyle.  Know what a healthy weight is for you (roughly BMI <25) and aim to maintain this   Aim for 7+ servings of fruits and vegetables daily   65-80+ fluid ounces of water or unsweet tea for healthy kidneys   Limit to max 1 drink of alcohol per day; avoid smoking/tobacco   Limit animal fats in diet for cholesterol and heart health - choose grass fed whenever available   Avoid highly processed foods, and foods high in saturated/trans fats   Aim for low stress - take time to unwind and care for your mental health   Aim for 150 min of moderate intensity exercise weekly for heart health, and weights twice weekly for bone health   Aim for 7-9 hours of sleep daily   When it comes to diets, agreement about the perfect plan isnt easy to find, even among the experts. Experts at the [employer] developed an idea known as the Healthy Eating Plate. Just imagine a plate divided into logical, healthy portions.   The emphasis is on diet quality:   Load up on vegetables and fruits - one-half of your plate: Aim for color and variety, and remember that potatoes dont count.   Go for whole grains - one-quarter of your plate: Whole wheat, barley, wheat berries, quinoa, oats, brown rice, and foods made with them. If you want pasta, go with whole wheat pasta.   Protein power - one-quarter of your plate: Fish, chicken, beans, and nuts are all healthy, versatile protein sources. Limit red meat.   The diet, however, does go beyond the plate, offering a few other suggestions.   Use healthy plant oils, such as olive, canola, soy, corn, sunflower and peanut. Check the labels, and avoid partially hydrogenated oil, which have unhealthy trans fats.   If youre thirsty, drink water. Coffee and tea are good in moderation, but skip sugary drinks and limit milk and dairy products to one or two daily servings.   The type of carbohydrate in the diet is more important than the  amount. Some sources of carbohydrates, such as vegetables, fruits, whole grains, and beans-are healthier than others.   Finally, stay active  Signed, Berniece Salines, DO  07/14/2020 2:02 PM    [place]

## 2020-07-14 NOTE — Patient Instructions (Signed)
Medication Instructions:  Your physician recommends that you continue on your current medications as directed. Please refer to the Current Medication list given to you today.  *If you need a refill on your cardiac medications before your next appointment, please call your pharmacy*   Lab Work: None If you have labs (blood work) drawn today and your tests are completely normal, you will receive your results only by: [name] MyChart Message (if you have MyChart) OR . A paper copy in the mail If you have any lab test that is abnormal or we need to change your treatment, we will call you to review the results.   Testing/Procedures: None   Follow-Up: At [facility], you and your health needs are our priority.  As part of our continuing mission to provide you with exceptional heart care, we have created designated Provider Care Teams.  These Care Teams include your primary Cardiologist (physician) and Advanced Practice Providers (APPs -  Physician Assistants and Nurse Practitioners) who all work together to provide you with the care you need, when you need it.  We recommend signing up for the patient portal called "MyChart".  Sign up information is provided on this After Visit Summary.  MyChart is used to connect with patients for Virtual Visits (Telemedicine).  Patients are able to view lab/test results, encounter notes, upcoming appointments, etc.  Non-urgent messages can be sent to your provider as well.   To learn more about what you can do with MyChart, go to [email or web address].    Your next appointment:   2 week(s)  The format for your next appointment:   In Person  Provider:   Berniece Salines, DO   Other Instructions

## 2020-07-26 ENCOUNTER — Encounter: Payer: Self-pay | Admitting: Cardiology

## 2020-07-26 ENCOUNTER — Ambulatory Visit (INDEPENDENT_AMBULATORY_CARE_PROVIDER_SITE_OTHER): Payer: Medicare PPO | Admitting: Cardiology

## 2020-07-26 ENCOUNTER — Other Ambulatory Visit: Payer: Self-pay

## 2020-07-26 ENCOUNTER — Ambulatory Visit: Payer: Medicare PPO | Admitting: Cardiology

## 2020-07-26 VITALS — BP 140/72 | HR 80 | Ht 70.0 in | Wt 177.0 lb

## 2020-07-26 DIAGNOSIS — N183 Chronic kidney disease, stage 3 unspecified: Secondary | ICD-10-CM

## 2020-07-26 DIAGNOSIS — I493 Ventricular premature depolarization: Secondary | ICD-10-CM

## 2020-07-26 DIAGNOSIS — I10 Essential (primary) hypertension: Secondary | ICD-10-CM | POA: Diagnosis not present

## 2020-07-26 DIAGNOSIS — I739 Peripheral vascular disease, unspecified: Secondary | ICD-10-CM | POA: Diagnosis not present

## 2020-07-26 DIAGNOSIS — E782 Mixed hyperlipidemia: Secondary | ICD-10-CM | POA: Diagnosis not present

## 2020-07-26 NOTE — Patient Instructions (Signed)
Medication Instructions:  Your physician recommends that you continue on your current medications as directed. Please refer to the Current Medication list given to you today.  *If you need a refill on your cardiac medications before your next appointment, please call your pharmacy*   Lab Work: None If you have labs (blood work) drawn today and your tests are completely normal, you will receive your results only by: [name] MyChart Message (if you have MyChart) OR . A paper copy in the mail If you have any lab test that is abnormal or we need to change your treatment, we will call you to review the results.   Testing/Procedures: None   Follow-Up: At [facility], you and your health needs are our priority.  As part of our continuing mission to provide you with exceptional heart care, we have created designated Provider Care Teams.  These Care Teams include your primary Cardiologist (physician) and Advanced Practice Providers (APPs -  Physician Assistants and Nurse Practitioners) who all work together to provide you with the care you need, when you need it.  We recommend signing up for the patient portal called "MyChart".  Sign up information is provided on this After Visit Summary.  MyChart is used to connect with patients for Virtual Visits (Telemedicine).  Patients are able to view lab/test results, encounter notes, upcoming appointments, etc.  Non-urgent messages can be sent to your provider as well.   To learn more about what you can do with MyChart, go to [email or web address].    Your next appointment:   3 month(s)  The format for your next appointment:   In Person  Provider:   Berniece Salines   Other Instructions

## 2020-07-26 NOTE — Progress Notes (Signed)
Cardiology Office Note:    Date:  07/26/2020   ID:  Joseph Locks., DOB 03-Oct-1946, MRN [id number]  PCP:  [name], MD  Cardiologist:  Berniece Salines, DO  Electrophysiologist:  None   Referring MD: [name], MD   Follow up visit.  History of Present Illness:    Joseph Hebertis a 74 y.o.malewith a hx of PAD, dyslipidemia T2 DM, hypertension and CKD.Marland KitchenHe had L femoral-popliteal bypass 2016 and left CEA 2016.For preoperative risk assessment he underwent a stress myocardial perfusion study 01/06/19  Which was normal.  I saw the patient in July 14, 2020 at that time he was significantly hypertensive.  His EKG showed evidence of occasional PVCs.  But he did tell me he had not taking his medication.  Therefore I could not make any changes to his antihypertensive medication.  I asked the patient to follow-up in 2 weeks.  He is here today for follow-up visit.  He did not bring his blood pressure recordings with him but he tells me at home his blood pressures in the 150s.  He also thinks that his blood pressure cuff is not working well.  Past Medical History:  Diagnosis Date  . Acute encephalopathy   . Acute hypoxemic respiratory failure ([place])   . Acute pulmonary edema (HCC)   . Acute respiratory failure with hypoxia ([place])   . Aftercare following surgery of the circulatory system, [place] 01/01/2014  . Anemia   . Arrhythmia    takes metoprolol daily  . Arthritis   . Asthma   . Bilateral leg pain 06/16/2015  . CAD (coronary artery disease)   . Cardiac asystole ([place])   . Cardiogenic shock ([place])   . Carotid artery occlusion    with Claudication  . Chronic back pain    stenosis  . Chronic kidney disease   . CKD (chronic kidney disease) stage 3, GFR 30-59 ml/min 07/18/2015  . COPD (chronic obstructive pulmonary disease) (HCC)    Spirva daily and Albuterol as needed  . Dementia ([place])   . Depression   . Diabetes mellitus without complication (HCC)    Type 2 diet  controlled.Never been on meds  . GERD (gastroesophageal reflux disease)   . Gout    takes Uloric daily  . HCAP (healthcare-associated pneumonia)   . Head injury    as a child  . History of colon polyps    benign  . History of gastric ulcer   . History of hiatal hernia   . Hyperlipidemia    takes Atorvastatin and Fenofibrate daily  . Hypertension    takes Lisinopril,Amlodipine,and Hydralazine daily  . Impaired memory    takes Namenda daily  . Intermittent claudication ([place]) 07/18/2015   Overview:  Operative management: The patient will be scheduled for a left femoral to  popliteal bypass in the near future after cardiac risk stratification. He  does have some superficial femoral and tibial artery occlusive disease in  the right leg. However his ABI on the right side is greater than 0.9.  Hopefully he will improve with just a walking program the right leg alone.  If not we could c  . Lumbosacral spondylosis with radiculopathy 02/17/2016  . Nevus of choroid of left eye 12/08/2014  . Numbness    lower left leg and upper right thigh  . Occlusion and stenosis of carotid artery without mention of cerebral infarction 01/01/2013  . PAD (peripheral artery disease) ([place])   . Pneumonia  hx of-couple of yrs ago  . Preoperative cardiovascular examination 07/17/2015   Overview:  Echo 2013 with EF 55-60% Lexiscan MPS 11/12/12 with normal perfusion and function  Overview:  Echo 2013 with EF 55-60% Lexiscan MPS 11/12/12 with normal perfusion and function  . Serous retinal detachment of left eye 12/08/2014  . Shortness of breath dyspnea    with exertion  . Status post intraocular lens implant 12/08/2014  . Tuberculosis 2006    9 months   . Vertigo   . Wears glasses     Past Surgical History:  Procedure Laterality Date  . ABDOMINAL AORTOGRAM W/LOWER EXTREMITY N/A 02/20/2019   Procedure: ABDOMINAL AORTOGRAM W/LOWER EXTREMITY;  Surgeon: [name], MD;  Location: [facility];  Service:  Cardiovascular;  Laterality: N/A;  . ABDOMINAL EXPOSURE N/A 01/22/2017   Procedure: ABDOMINAL EXPOSURE;  Surgeon: [name], MD;  Location: [facility];  Service: Vascular;  Laterality: N/A;  . ANTERIOR LAT LUMBAR FUSION N/A 01/22/2017   Procedure: LUMBAR THREE-FOUR, LUMBAR FOUR-FIVE ANTERIOR LATERAL INTERBODY FUSION;  Surgeon: [name], MD;  Location: [facility];  Service: Neurosurgery;  Laterality: N/A;  L3-4 L4-5 Lateral interbody fusion  . ANTERIOR LUMBAR FUSION N/A 01/22/2017   Procedure: LUMBAR FIVE-SACRUM ONE ANTERIOR LUMBAR INTERBODY FUSION WITH ABDOMINAL APPROACH BY DR [name];  Surgeon: [name], MD;  Location: [facility];  Service: Neurosurgery;  Laterality: N/A;  . CAROTID BODY TUMOR EXCISION Left 09/21/2015   Procedure: EXCISE LEFT NECK NODULE WITH LOCAL ;  Surgeon: [name], MD;  Location: [facility];  Service: Vascular;  Laterality: Left;  . CAROTID ENDARTERECTOMY  Nov. 15,2011   LEFT cea  . cataract surgery Bilateral   . COLONOSCOPY    . EYE SURGERY    . FEMORAL-POPLITEAL BYPASS GRAFT Left 08/23/2015   Procedure: LEFT FEMORAL-POPLITEAL ARTERY BYPASS WITH GORETEX GRAFT;  Surgeon: [name], MD;  Location: [facility];  Service: Vascular;  Laterality: Left;  . FEMORAL-POPLITEAL BYPASS GRAFT Left 02/23/2019   Procedure: REDO LEFT FEMORAL TO POPLITEAL ARTERY BYPASS GRAFT Using Propaten graft;  Surgeon: [name], MD;  Location: [facility];  Service: Vascular;  Laterality: Left;  . JOINT REPLACEMENT  1980   RIGHT  knee  . LUMBAR EPIDURAL INJECTION    . LUMBAR LAMINECTOMY/DECOMPRESSION MICRODISCECTOMY Right 02/17/2016   Procedure: Laminectomy and Foraminotomy - Lumbar four-five right;  Surgeon: [name], MD;  Location: [facility];  Service: Neurosurgery;  Laterality: Right;  right  . LUMBAR PERCUTANEOUS PEDICLE SCREW 3 LEVEL N/A 01/22/2017   Procedure: LUMBAR THREE-SACRAL ONE PERCUTANEOUS PEDICLE SCREW FIXATION WITH ROBOTIC ASSISTANCE;  Surgeon: [name], MD;  Location: [facility];  Service: Neurosurgery;  Laterality: N/A;  L3 to S1 Percutaneous pedicle screw fixation  . PERIPHERAL VASCULAR CATHETERIZATION N/A 06/24/2015   Procedure: Abdominal Aortogram;  Surgeon: [name], MD;  Location: [facility];  Service: Cardiovascular;  Laterality: N/A;  . Stents  Aug.  23, 1999   Bilateral iliofemoral  stents, [place].  . TONSILLECTOMY      Current Medications: Current Meds  Medication Sig  . amLODipine (NORVASC) 10 MG tablet Take 10 mg by mouth daily at 10 pm. (2100)  . aspirin EC 81 MG tablet Take 81 mg by mouth daily at 10 pm. (2100)  . atorvastatin (LIPITOR) 80 MG tablet Take 80 mg by mouth daily at 10 pm. (2100)  . losartan (COZAAR) 100 MG tablet Take 100 mg by mouth  daily.   [name] ULORIC 80 MG TABS Take 80 mg by mouth daily at 10 pm. (2100)     Allergies:   Lopressor [metoprolol tartrate]   Social History   Socioeconomic History  . Marital status: Married    Spouse name: Not on file  . Number of children: Not on file  . Years of education: Not on file  . Highest education level: Not on file  Occupational History  . Not on file  Tobacco Use  . Smoking status: Current Every Day Smoker    Packs/day: 0.30    Types: Cigarettes  . Smokeless tobacco: Never Used  Vaping Use  . Vaping Use: Never used  Substance and Sexual Activity  . Alcohol use: No    Alcohol/week: 0.0 standard drinks  . Drug use: No  . Sexual activity: Yes  Other Topics Concern  . Not on file  Social History Narrative  . Not on file   Social Determinants of Health   Financial Resource Strain:   . Difficulty of Paying Living Expenses:   Food Insecurity:   . Worried About Charity fundraiser in the Last Year:   . Arboriculturist in the Last Year:   Transportation Needs:   . Film/video editor (Medical):   [name] Lack of Transportation (Non-Medical):   Physical Activity:   . Days of Exercise per Week:   . Minutes of Exercise per Session:    Stress:   . Feeling of Stress :   Social Connections:   . Frequency of Communication with Friends and Family:   . Frequency of Social Gatherings with Friends and Family:   . Attends Religious Services:   . Active Member of Clubs or Organizations:   . Attends Archivist Meetings:   [name] Marital Status:      Family History: The patient's family history includes Diabetes in his mother; Heart disease in his sister; Hyperlipidemia in his father; Hypertension in his father; Other in his father.  ROS:   Review of Systems  Constitution: Negative for decreased appetite, fever and weight gain.  HENT: Negative for congestion, ear discharge, hoarse voice and sore throat.   Eyes: Negative for discharge, redness, vision loss in right eye and visual halos.  Cardiovascular: Negative for chest pain, dyspnea on exertion, leg swelling, orthopnea and palpitations.  Respiratory: Negative for cough, hemoptysis, shortness of breath and snoring.   Endocrine: Negative for heat intolerance and polyphagia.  Hematologic/Lymphatic: Negative for bleeding problem. Does not bruise/bleed easily.  Skin: Negative for flushing, nail changes, rash and suspicious lesions.  Musculoskeletal: Negative for arthritis, joint pain, muscle cramps, myalgias, neck pain and stiffness.  Gastrointestinal: Negative for abdominal pain, bowel incontinence, diarrhea and excessive appetite.  Genitourinary: Negative for decreased libido, genital sores and incomplete emptying.  Neurological: Negative for brief paralysis, focal weakness, headaches and loss of balance.  Psychiatric/Behavioral: Negative for altered mental status, depression and suicidal ideas.  Allergic/Immunologic: Negative for HIV exposure and persistent infections.    EKGs/Labs/Other Studies Reviewed:    The following studies were reviewed today:   EKG: None today  Recent Labs: No results found for requested labs within last 8760 hours.  Recent Lipid  Panel No results found for: CHOL, TRIG, HDL, CHOLHDL, VLDL, LDLCALC, LDLDIRECT  Physical Exam:    VS:  BP 140/72 (BP Location: Left Arm)   Pulse 80   Ht 5\' 10"  (1.778 m)   Wt 177 lb (80.3 kg)   SpO2 98%  BMI 25.40 kg/m     Wt Readings from Last 3 Encounters:  07/26/20 177 lb (80.3 kg)  07/14/20 178 lb (80.7 kg)  09/10/19 200 lb (90.7 kg)     GEN: Well nourished, well developed in no acute distress HEENT: Normal NECK: No JVD; No carotid bruits LYMPHATICS: No lymphadenopathy CARDIAC: S1S2 noted,RRR, no murmurs, rubs, gallops RESPIRATORY:  Clear to auscultation without rales, wheezing or rhonchi  ABDOMEN: Soft, non-tender, non-distended, +bowel sounds, no guarding. EXTREMITIES: No edema, No cyanosis, no clubbing MUSCULOSKELETAL:  No deformity  SKIN: Warm and dry NEUROLOGIC:  Alert and oriented x 3, non-focal PSYCHIATRIC:  Normal affect, good insight  ASSESSMENT:    1. PAD (peripheral artery disease) ([place])   2. Essential hypertension   3. PVC (premature ventricular contraction)   4. Stage 3 chronic kidney disease, unspecified whether stage 3a or 3b CKD   5. Mixed hyperlipidemia    PLAN:     His blood pressure manually today taken by me 140/72 mmHg.  He is on amlodipine 10 mg daily as well as losartan 100 mg daily.  I am not going to change his antihypertensive regimen at this time.  He is going to get himself a new blood pressure cuff and be able to take his blood pressure daily.  In terms of his PVCs he denies any symptoms.  We will continue to monitor.  Tobacco use-smoking cessation advised.  Hyperlipidemia-continue patient on atorvastatin 80 mg daily.  Peripheral artery disease-continue patient on his current aspirin and statin.  The patient is in agreement with the above plan. The patient left the office in stable condition.  The patient will follow up in 3 months or sooner if needed.   Medication Adjustments/Labs and Tests Ordered: Current medicines are  reviewed at length with the patient today.  Concerns regarding medicines are outlined above.  No orders of the defined types were placed in this encounter.  No orders of the defined types were placed in this encounter.   Patient Instructions  Medication Instructions:  Your physician recommends that you continue on your current medications as directed. Please refer to the Current Medication list given to you today.  *If you need a refill on your cardiac medications before your next appointment, please call your pharmacy*   Lab Work: None If you have labs (blood work) drawn today and your tests are completely normal, you will receive your results only by: [name] MyChart Message (if you have MyChart) OR . A paper copy in the mail If you have any lab test that is abnormal or we need to change your treatment, we will call you to review the results.   Testing/Procedures: None   Follow-Up: At [facility], you and your health needs are our priority.  As part of our continuing mission to provide you with exceptional heart care, we have created designated Provider Care Teams.  These Care Teams include your primary Cardiologist (physician) and Advanced Practice Providers (APPs -  Physician Assistants and Nurse Practitioners) who all work together to provide you with the care you need, when you need it.  We recommend signing up for the patient portal called "MyChart".  Sign up information is provided on this After Visit Summary.  MyChart is used to connect with patients for Virtual Visits (Telemedicine).  Patients are able to view lab/test results, encounter notes, upcoming appointments, etc.  Non-urgent messages can be sent to your provider as well.   To learn more about what you can do with  MyChart, go to [email or web address].    Your next appointment:   3 month(s)  The format for your next appointment:   In Person  Provider:   Berniece Salines   Other Instructions      Adopting a  Healthy Lifestyle.  Know what a healthy weight is for you (roughly BMI <25) and aim to maintain this   Aim for 7+ servings of fruits and vegetables daily   65-80+ fluid ounces of water or unsweet tea for healthy kidneys   Limit to max 1 drink of alcohol per day; avoid smoking/tobacco   Limit animal fats in diet for cholesterol and heart health - choose grass fed whenever available   Avoid highly processed foods, and foods high in saturated/trans fats   Aim for low stress - take time to unwind and care for your mental health   Aim for 150 min of moderate intensity exercise weekly for heart health, and weights twice weekly for bone health   Aim for 7-9 hours of sleep daily   When it comes to diets, agreement about the perfect plan isnt easy to find, even among the experts. Experts at the [employer] developed an idea known as the Healthy Eating Plate. Just imagine a plate divided into logical, healthy portions.   The emphasis is on diet quality:   Load up on vegetables and fruits - one-half of your plate: Aim for color and variety, and remember that potatoes dont count.   Go for whole grains - one-quarter of your plate: Whole wheat, barley, wheat berries, quinoa, oats, brown rice, and foods made with them. If you want pasta, go with whole wheat pasta.   Protein power - one-quarter of your plate: Fish, chicken, beans, and nuts are all healthy, versatile protein sources. Limit red meat.   The diet, however, does go beyond the plate, offering a few other suggestions.   Use healthy plant oils, such as olive, canola, soy, corn, sunflower and peanut. Check the labels, and avoid partially hydrogenated oil, which have unhealthy trans fats.   If youre thirsty, drink water. Coffee and tea are good in moderation, but skip sugary drinks and limit milk and dairy products to one or two daily servings.   The type of carbohydrate in the diet is more important than the  amount. Some sources of carbohydrates, such as vegetables, fruits, whole grains, and beans-are healthier than others.   Finally, stay active  Signed, Berniece Salines, DO  07/26/2020 9:26 AM    [facility]

## 2020-08-31 DIAGNOSIS — Z23 Encounter for immunization: Secondary | ICD-10-CM | POA: Diagnosis not present

## 2020-08-31 DIAGNOSIS — R809 Proteinuria, unspecified: Secondary | ICD-10-CM | POA: Diagnosis not present

## 2020-08-31 DIAGNOSIS — N189 Chronic kidney disease, unspecified: Secondary | ICD-10-CM | POA: Diagnosis not present

## 2020-08-31 DIAGNOSIS — E785 Hyperlipidemia, unspecified: Secondary | ICD-10-CM | POA: Diagnosis not present

## 2020-08-31 DIAGNOSIS — N184 Chronic kidney disease, stage 4 (severe): Secondary | ICD-10-CM | POA: Diagnosis not present

## 2020-08-31 DIAGNOSIS — I129 Hypertensive chronic kidney disease with stage 1 through stage 4 chronic kidney disease, or unspecified chronic kidney disease: Secondary | ICD-10-CM | POA: Diagnosis not present

## 2020-08-31 DIAGNOSIS — E1122 Type 2 diabetes mellitus with diabetic chronic kidney disease: Secondary | ICD-10-CM | POA: Diagnosis not present

## 2020-08-31 DIAGNOSIS — N2581 Secondary hyperparathyroidism of renal origin: Secondary | ICD-10-CM | POA: Diagnosis not present

## 2020-08-31 DIAGNOSIS — D631 Anemia in chronic kidney disease: Secondary | ICD-10-CM | POA: Diagnosis not present

## 2020-08-31 DIAGNOSIS — N179 Acute kidney failure, unspecified: Secondary | ICD-10-CM | POA: Diagnosis not present

## 2020-10-17 DIAGNOSIS — L821 Other seborrheic keratosis: Secondary | ICD-10-CM | POA: Diagnosis not present

## 2020-10-17 DIAGNOSIS — L300 Nummular dermatitis: Secondary | ICD-10-CM | POA: Diagnosis not present

## 2020-10-17 DIAGNOSIS — L578 Other skin changes due to chronic exposure to nonionizing radiation: Secondary | ICD-10-CM | POA: Diagnosis not present

## 2020-10-26 DIAGNOSIS — Z862 Personal history of diseases of the blood and blood-forming organs and certain disorders involving the immune mechanism: Secondary | ICD-10-CM | POA: Insufficient documentation

## 2020-10-26 DIAGNOSIS — J45909 Unspecified asthma, uncomplicated: Secondary | ICD-10-CM | POA: Insufficient documentation

## 2020-10-26 DIAGNOSIS — N185 Chronic kidney disease, stage 5: Secondary | ICD-10-CM | POA: Insufficient documentation

## 2020-10-26 DIAGNOSIS — R200 Anesthesia of skin: Secondary | ICD-10-CM | POA: Insufficient documentation

## 2020-10-26 DIAGNOSIS — F32A Depression, unspecified: Secondary | ICD-10-CM | POA: Insufficient documentation

## 2020-10-26 DIAGNOSIS — M549 Dorsalgia, unspecified: Secondary | ICD-10-CM | POA: Insufficient documentation

## 2020-10-26 DIAGNOSIS — M109 Gout, unspecified: Secondary | ICD-10-CM | POA: Insufficient documentation

## 2020-10-26 DIAGNOSIS — E119 Type 2 diabetes mellitus without complications: Secondary | ICD-10-CM | POA: Insufficient documentation

## 2020-10-26 DIAGNOSIS — K219 Gastro-esophageal reflux disease without esophagitis: Secondary | ICD-10-CM | POA: Insufficient documentation

## 2020-10-26 DIAGNOSIS — F0390 Unspecified dementia without behavioral disturbance: Secondary | ICD-10-CM | POA: Insufficient documentation

## 2020-10-26 DIAGNOSIS — I2510 Atherosclerotic heart disease of native coronary artery without angina pectoris: Secondary | ICD-10-CM | POA: Insufficient documentation

## 2020-10-26 DIAGNOSIS — J189 Pneumonia, unspecified organism: Secondary | ICD-10-CM | POA: Insufficient documentation

## 2020-10-26 DIAGNOSIS — Z8711 Personal history of peptic ulcer disease: Secondary | ICD-10-CM | POA: Insufficient documentation

## 2020-10-26 DIAGNOSIS — I499 Cardiac arrhythmia, unspecified: Secondary | ICD-10-CM | POA: Insufficient documentation

## 2020-10-26 DIAGNOSIS — R413 Other amnesia: Secondary | ICD-10-CM | POA: Insufficient documentation

## 2020-10-26 DIAGNOSIS — R570 Cardiogenic shock: Secondary | ICD-10-CM | POA: Insufficient documentation

## 2020-10-26 DIAGNOSIS — I6529 Occlusion and stenosis of unspecified carotid artery: Secondary | ICD-10-CM | POA: Insufficient documentation

## 2020-10-26 DIAGNOSIS — Z8719 Personal history of other diseases of the digestive system: Secondary | ICD-10-CM | POA: Insufficient documentation

## 2020-10-26 DIAGNOSIS — Z860100 Personal history of colon polyps, unspecified: Secondary | ICD-10-CM | POA: Insufficient documentation

## 2020-10-26 DIAGNOSIS — J449 Chronic obstructive pulmonary disease, unspecified: Secondary | ICD-10-CM | POA: Insufficient documentation

## 2020-10-26 DIAGNOSIS — D649 Anemia, unspecified: Secondary | ICD-10-CM | POA: Insufficient documentation

## 2020-10-26 DIAGNOSIS — R42 Dizziness and giddiness: Secondary | ICD-10-CM | POA: Insufficient documentation

## 2020-10-26 DIAGNOSIS — Z973 Presence of spectacles and contact lenses: Secondary | ICD-10-CM | POA: Insufficient documentation

## 2020-10-26 DIAGNOSIS — I469 Cardiac arrest, cause unspecified: Secondary | ICD-10-CM | POA: Insufficient documentation

## 2020-10-26 DIAGNOSIS — G8929 Other chronic pain: Secondary | ICD-10-CM | POA: Insufficient documentation

## 2020-10-26 DIAGNOSIS — M1990 Unspecified osteoarthritis, unspecified site: Secondary | ICD-10-CM | POA: Insufficient documentation

## 2020-10-26 DIAGNOSIS — S0990XA Unspecified injury of head, initial encounter: Secondary | ICD-10-CM | POA: Insufficient documentation

## 2020-10-26 DIAGNOSIS — N189 Chronic kidney disease, unspecified: Secondary | ICD-10-CM | POA: Insufficient documentation

## 2020-10-27 ENCOUNTER — Other Ambulatory Visit: Payer: Self-pay

## 2020-10-27 ENCOUNTER — Encounter: Payer: Self-pay | Admitting: Cardiology

## 2020-10-27 ENCOUNTER — Ambulatory Visit (INDEPENDENT_AMBULATORY_CARE_PROVIDER_SITE_OTHER): Payer: Medicare PPO | Admitting: Cardiology

## 2020-10-27 VITALS — BP 110/80 | HR 80 | Ht 70.0 in | Wt 183.2 lb

## 2020-10-27 DIAGNOSIS — I2510 Atherosclerotic heart disease of native coronary artery without angina pectoris: Secondary | ICD-10-CM | POA: Diagnosis not present

## 2020-10-27 DIAGNOSIS — I739 Peripheral vascular disease, unspecified: Secondary | ICD-10-CM | POA: Diagnosis not present

## 2020-10-27 DIAGNOSIS — I10 Essential (primary) hypertension: Secondary | ICD-10-CM

## 2020-10-27 DIAGNOSIS — I493 Ventricular premature depolarization: Secondary | ICD-10-CM

## 2020-10-27 NOTE — Patient Instructions (Signed)
Medication Instructions:  *If you need a refill on your cardiac medications before your next appointment, please call your pharmacy*  Lab Work: If you have labs (blood work) drawn today and your tests are completely normal, you will receive your results only by:  [place] (if you have MyChart) OR  A paper copy in the mail If you have any lab test that is abnormal or we need to change your treatment, we will call you to review the results.  Follow-Up: At [facility], you and your health needs are our priority.  As part of our continuing mission to provide you with exceptional heart care, we have created designated Provider Care Teams.  These Care Teams include your primary Cardiologist (physician) and Advanced Practice Providers (APPs -  Physician Assistants and Nurse Practitioners) who all work together to provide you with the care you need, when you need it.  We recommend signing up for the patient portal called "MyChart".  Sign up information is provided on this After Visit Summary.  MyChart is used to connect with patients for Virtual Visits (Telemedicine).  Patients are able to view lab/test results, encounter notes, upcoming appointments, etc.  Non-urgent messages can be sent to your provider as well.   To learn more about what you can do with MyChart, go to [email or web address].    Your next appointment:   1 year  The format for your next appointment:   In Person  Provider:   Berniece Salines, DO

## 2020-10-27 NOTE — Progress Notes (Signed)
Cardiology Office Note:    Date:  10/27/2020   ID:  Joseph Locks., DOB 02-27-1946, MRN [id number]  PCP:  [name], MD  Cardiologist:  Berniece Salines, DO  Electrophysiologist:  None   Referring MD: [name], MD  I am doing fine   History of Present Illness:    Joseph Hebert. is a 74 y.o. male with a hx of PAD, dyslipidemia T2 DM, hypertension and CKD.Marland KitchenHe had L femoral-popliteal bypass 2016 and left CEA 2016.For preoperative risk assessment he underwent a stress myocardial perfusion study 01/06/19 Which was normal.  As outpatient August 2021 at that time his blood pressure had improved.  We kept him on his antihypertensive medication asked him to take his blood pressure daily, for follow-up with this information.  He is here today for follow he tells me he has been doing well from a cardiovascular standpoint.  He is taking his blood pressure daily and is watching his salt.  He denies any chest pain, shortness of breath, nausea, vomiting.    Past Medical History:  Diagnosis Date  . Acute encephalopathy   . Acute hypoxemic respiratory failure ([place])   . Acute pulmonary edema (HCC)   . Acute respiratory failure with hypoxia ([place])   . Aftercare following surgery of the circulatory system, [name] 01/01/2014  . Anemia   . Arrhythmia    takes metoprolol daily  . Arthritis   . Asthma   . Bilateral leg pain 06/16/2015  . CAD (coronary artery disease)   . Cardiac asystole ([place])   . Cardiogenic shock ([place])   . Carotid artery occlusion    with Claudication  . Chronic back pain    stenosis  . Chronic kidney disease   . CKD (chronic kidney disease) stage 3, GFR 30-59 ml/min (HCC) 07/18/2015  . COPD (chronic obstructive pulmonary disease) (HCC)    Spirva daily and Albuterol as needed  . Dementia ([place])   . Depression   . Diabetes mellitus without complication (HCC)    Type 2 diet controlled.Never been on meds  . GERD (gastroesophageal reflux disease)   . Gout     takes Uloric daily  . HCAP (healthcare-associated pneumonia)   . Head injury    as a child  . History of colon polyps    benign  . History of gastric ulcer   . History of hiatal hernia   . Hyperlipidemia    takes Atorvastatin and Fenofibrate daily  . Hypertension    takes Lisinopril,Amlodipine,and Hydralazine daily  . Impaired memory    takes Namenda daily  . Intermittent claudication ([place]) 07/18/2015   Overview:  Operative management: The patient will be scheduled for a left femoral to  popliteal bypass in the near future after cardiac risk stratification. He  does have some superficial femoral and tibial artery occlusive disease in  the right leg. However his ABI on the right side is greater than 0.9.  Hopefully he will improve with just a walking program the right leg alone.  If not we could c  . Lumbosacral spondylosis with radiculopathy 02/17/2016  . Nevus of choroid of left eye 12/08/2014  . Numbness    lower left leg and upper right thigh  . Occlusion and stenosis of carotid artery without mention of cerebral infarction 01/01/2013  . PAD (peripheral artery disease) ([name])   . Pneumonia    hx of-couple of yrs ago  . Preoperative cardiovascular examination 07/17/2015   Overview:  Echo 2013 with  EF 55-60% Lexiscan MPS 11/12/12 with normal perfusion and function  Overview:  Echo 2013 with EF 55-60% Lexiscan MPS 11/12/12 with normal perfusion and function  . Serous retinal detachment of left eye 12/08/2014  . Shortness of breath dyspnea    with exertion  . Status post intraocular lens implant 12/08/2014  . Tuberculosis 2006    9 months   . Vertigo   . Wears glasses     Past Surgical History:  Procedure Laterality Date  . ABDOMINAL AORTOGRAM W/LOWER EXTREMITY N/A 02/20/2019   Procedure: ABDOMINAL AORTOGRAM W/LOWER EXTREMITY;  Surgeon: [name], MD;  Location: [facility];  Service: Cardiovascular;  Laterality: N/A;  . ABDOMINAL EXPOSURE N/A 01/22/2017   Procedure:  ABDOMINAL EXPOSURE;  Surgeon: [name], MD;  Location: [facility];  Service: Vascular;  Laterality: N/A;  . ANTERIOR LAT LUMBAR FUSION N/A 01/22/2017   Procedure: LUMBAR THREE-FOUR, LUMBAR FOUR-FIVE ANTERIOR LATERAL INTERBODY FUSION;  Surgeon: [name], MD;  Location: [facility];  Service: Neurosurgery;  Laterality: N/A;  L3-4 L4-5 Lateral interbody fusion  . ANTERIOR LUMBAR FUSION N/A 01/22/2017   Procedure: LUMBAR FIVE-SACRUM ONE ANTERIOR LUMBAR INTERBODY FUSION WITH ABDOMINAL APPROACH BY DR [name];  Surgeon: [name], MD;  Location: [facility];  Service: Neurosurgery;  Laterality: N/A;  . CAROTID BODY TUMOR EXCISION Left 09/21/2015   Procedure: EXCISE LEFT NECK NODULE WITH LOCAL ;  Surgeon: [name], MD;  Location: [name];  Service: Vascular;  Laterality: Left;  . CAROTID ENDARTERECTOMY  Nov. 15,2011   LEFT cea  . cataract surgery Bilateral   . COLONOSCOPY    . EYE SURGERY    . FEMORAL-POPLITEAL BYPASS GRAFT Left 08/23/2015   Procedure: LEFT FEMORAL-POPLITEAL ARTERY BYPASS WITH GORETEX GRAFT;  Surgeon: [name], MD;  Location: [facility];  Service: Vascular;  Laterality: Left;  . FEMORAL-POPLITEAL BYPASS GRAFT Left 02/23/2019   Procedure: REDO LEFT FEMORAL TO POPLITEAL ARTERY BYPASS GRAFT Using Propaten graft;  Surgeon: [name], MD;  Location: [facility];  Service: Vascular;  Laterality: Left;  . JOINT REPLACEMENT  1980   RIGHT  knee  . LUMBAR EPIDURAL INJECTION    . LUMBAR LAMINECTOMY/DECOMPRESSION MICRODISCECTOMY Right 02/17/2016   Procedure: Laminectomy and Foraminotomy - Lumbar four-five right;  Surgeon: [name], MD;  Location: [facility];  Service: Neurosurgery;  Laterality: Right;  right  . LUMBAR PERCUTANEOUS PEDICLE SCREW 3 LEVEL N/A 01/22/2017   Procedure: LUMBAR THREE-SACRAL ONE PERCUTANEOUS PEDICLE SCREW FIXATION WITH ROBOTIC ASSISTANCE;  Surgeon: [name], MD;  Location: [facility];  Service: Neurosurgery;  Laterality: N/A;  L3 to S1  Percutaneous pedicle screw fixation  . PERIPHERAL VASCULAR CATHETERIZATION N/A 06/24/2015   Procedure: Abdominal Aortogram;  Surgeon: [name], MD;  Location: [facility];  Service: Cardiovascular;  Laterality: N/A;  . Stents  Aug.  23, 1999   Bilateral iliofemoral  stents, [place].  . TONSILLECTOMY      Current Medications: Current Meds  Medication Sig  . amLODipine (NORVASC) 10 MG tablet Take 10 mg by mouth daily at 10 pm. (2100)  . aspirin EC 81 MG tablet Take 81 mg by mouth daily at 10 pm. (2100)  . atorvastatin (LIPITOR) 80 MG tablet Take 80 mg by mouth daily at 10 pm. (2100)  . losartan (COZAAR) 100 MG tablet Take 100 mg by mouth daily.   [name] triamcinolone ointment (KENALOG) 0.5 %   . ULORIC 80 MG TABS Take 80  mg by mouth daily at 10 pm. (2100)     Allergies:   Lopressor [metoprolol tartrate]   Social History   Socioeconomic History  . Marital status: Married    Spouse name: Not on file  . Number of children: Not on file  . Years of education: Not on file  . Highest education level: Not on file  Occupational History  . Not on file  Tobacco Use  . Smoking status: Current Every Day Smoker    Packs/day: 0.30    Types: Cigarettes  . Smokeless tobacco: Never Used  Vaping Use  . Vaping Use: Never used  Substance and Sexual Activity  . Alcohol use: No    Alcohol/week: 0.0 standard drinks  . Drug use: No  . Sexual activity: Yes  Other Topics Concern  . Not on file  Social History Narrative  . Not on file   Social Determinants of Health   Financial Resource Strain:   . Difficulty of Paying Living Expenses: Not on file  Food Insecurity:   . Worried About Charity fundraiser in the Last Year: Not on file  . Ran Out of Food in the Last Year: Not on file  Transportation Needs:   . Lack of Transportation (Medical): Not on file  . Lack of Transportation (Non-Medical): Not on file  Physical Activity:   . Days of Exercise per Week: Not on file  . Minutes of  Exercise per Session: Not on file  Stress:   . Feeling of Stress : Not on file  Social Connections:   . Frequency of Communication with Friends and Family: Not on file  . Frequency of Social Gatherings with Friends and Family: Not on file  . Attends Religious Services: Not on file  . Active Member of Clubs or Organizations: Not on file  . Attends Archivist Meetings: Not on file  . Marital Status: Not on file     Family History: The patient's family history includes Diabetes in his mother; Heart disease in his sister; Hyperlipidemia in his father; Hypertension in his father; Other in his father.  ROS:   Review of Systems  Constitution: Negative for decreased appetite, fever and weight gain.  HENT: Negative for congestion, ear discharge, hoarse voice and sore throat.   Eyes: Negative for discharge, redness, vision loss in right eye and visual halos.  Cardiovascular: Negative for chest pain, dyspnea on exertion, leg swelling, orthopnea and palpitations.  Respiratory: Negative for cough, hemoptysis, shortness of breath and snoring.   Endocrine: Negative for heat intolerance and polyphagia.  Hematologic/Lymphatic: Negative for bleeding problem. Does not bruise/bleed easily.  Skin: Negative for flushing, nail changes, rash and suspicious lesions.  Musculoskeletal: Negative for arthritis, joint pain, muscle cramps, myalgias, neck pain and stiffness.  Gastrointestinal: Negative for abdominal pain, bowel incontinence, diarrhea and excessive appetite.  Genitourinary: Negative for decreased libido, genital sores and incomplete emptying.  Neurological: Negative for brief paralysis, focal weakness, headaches and loss of balance.  Psychiatric/Behavioral: Negative for altered mental status, depression and suicidal ideas.  Allergic/Immunologic: Negative for HIV exposure and persistent infections.    EKGs/Labs/Other Studies Reviewed:    The following studies were reviewed  today:   EKG: None today  Recent Labs: No results found for requested labs within last 8760 hours.  Recent Lipid Panel No results found for: CHOL, TRIG, HDL, CHOLHDL, VLDL, LDLCALC, LDLDIRECT  Physical Exam:    VS:  BP 110/80   Pulse 80   Ht 5\' 10"  (1.778  m)   Wt 183 lb 3.2 oz (83.1 kg)   SpO2 98%   BMI 26.29 kg/m     Wt Readings from Last 3 Encounters:  10/27/20 183 lb 3.2 oz (83.1 kg)  07/26/20 177 lb (80.3 kg)  07/14/20 178 lb (80.7 kg)    You on GEN: Well nourished, well developed in no acute distress HEENT: Normal NECK: No JVD; No carotid bruits LYMPHATICS: No lymphadenopathy CARDIAC: S1S2 noted,RRR, no murmurs, rubs, gallops RESPIRATORY:  Clear to auscultation without rales, wheezing or rhonchi  ABDOMEN: Soft, non-tender, non-distended, +bowel sounds, no guarding. EXTREMITIES: No edema, No cyanosis, no clubbing MUSCULOSKELETAL:  No deformity  SKIN: Warm and dry NEUROLOGIC:  Alert and oriented x 3, non-focal PSYCHIATRIC:  Normal affect, good insight good  ASSESSMENT:    1. Primary hypertension   2. PAD (peripheral artery disease) ([place])   3. PVC (premature ventricular contraction)   4. Coronary artery disease involving native coronary artery of native heart without angina pectoris    PLAN:    He is doing well from a cardiovascular standpoint. I will continue his current medication regimen.  Continue aspirin and statin.  The patient is in agreement with the above plan. The patient left the office in stable condition.  The patient will follow up in 12 months.    Medication Adjustments/Labs and Tests Ordered: Current medicines are reviewed at length with the patient today.  Concerns regarding medicines are outlined above.  No orders of the defined types were placed in this encounter.  No orders of the defined types were placed in this encounter.   Patient Instructions  Medication Instructions:  *If you need a refill on your cardiac medications before  your next appointment, please call your pharmacy*  Lab Work: If you have labs (blood work) drawn today and your tests are completely normal, you will receive your results only by: [name] MyChart Message (if you have MyChart) OR . A paper copy in the mail If you have any lab test that is abnormal or we need to change your treatment, we will call you to review the results.  Follow-Up: At [facility], you and your health needs are our priority.  As part of our continuing mission to provide you with exceptional heart care, we have created designated Provider Care Teams.  These Care Teams include your primary Cardiologist (physician) and Advanced Practice Providers (APPs -  Physician Assistants and Nurse Practitioners) who all work together to provide you with the care you need, when you need it.  We recommend signing up for the patient portal called "MyChart".  Sign up information is provided on this After Visit Summary.  MyChart is used to connect with patients for Virtual Visits (Telemedicine).  Patients are able to view lab/test results, encounter notes, upcoming appointments, etc.  Non-urgent messages can be sent to your provider as well.   To learn more about what you can do with MyChart, go to [email or web address].    Your next appointment:   1 year  The format for your next appointment:   In Person  Provider:   Berniece Salines, DO        Adopting a Healthy Lifestyle.  Know what a healthy weight is for you (roughly BMI <25) and aim to maintain this   Aim for 7+ servings of fruits and vegetables daily   65-80+ fluid ounces of water or unsweet tea for healthy kidneys   Limit to max 1 drink of alcohol per day; avoid smoking/tobacco  Limit animal fats in diet for cholesterol and heart health - choose grass fed whenever available   Avoid highly processed foods, and foods high in saturated/trans fats   Aim for low stress - take time to unwind and care for your mental health    Aim for 150 min of moderate intensity exercise weekly for heart health, and weights twice weekly for bone health   Aim for 7-9 hours of sleep daily   When it comes to diets, agreement about the perfect plan isnt easy to find, even among the experts. Experts at the [employer] developed an idea known as the Healthy Eating Plate. Just imagine a plate divided into logical, healthy portions.   The emphasis is on diet quality:   Load up on vegetables and fruits - one-half of your plate: Aim for color and variety, and remember that potatoes dont count.   Go for whole grains - one-quarter of your plate: Whole wheat, barley, wheat berries, quinoa, oats, brown rice, and foods made with them. If you want pasta, go with whole wheat pasta.   Protein power - one-quarter of your plate: Fish, chicken, beans, and nuts are all healthy, versatile protein sources. Limit red meat.   The diet, however, does go beyond the plate, offering a few other suggestions.   Use healthy plant oils, such as olive, canola, soy, corn, sunflower and peanut. Check the labels, and avoid partially hydrogenated oil, which have unhealthy trans fats.   If youre thirsty, drink water. Coffee and tea are good in moderation, but skip sugary drinks and limit milk and dairy products to one or two daily servings.   The type of carbohydrate in the diet is more important than the amount. Some sources of carbohydrates, such as vegetables, fruits, whole grains, and beans-are healthier than others.   Finally, stay active  Signed, Berniece Salines, DO  10/27/2020 1:13 PM    [facility]

## 2020-11-29 DIAGNOSIS — I129 Hypertensive chronic kidney disease with stage 1 through stage 4 chronic kidney disease, or unspecified chronic kidney disease: Secondary | ICD-10-CM | POA: Diagnosis not present

## 2020-11-29 DIAGNOSIS — N179 Acute kidney failure, unspecified: Secondary | ICD-10-CM | POA: Diagnosis not present

## 2020-11-29 DIAGNOSIS — I739 Peripheral vascular disease, unspecified: Secondary | ICD-10-CM | POA: Diagnosis not present

## 2020-11-29 DIAGNOSIS — D631 Anemia in chronic kidney disease: Secondary | ICD-10-CM | POA: Diagnosis not present

## 2020-11-29 DIAGNOSIS — E785 Hyperlipidemia, unspecified: Secondary | ICD-10-CM | POA: Diagnosis not present

## 2020-11-29 DIAGNOSIS — N2581 Secondary hyperparathyroidism of renal origin: Secondary | ICD-10-CM | POA: Diagnosis not present

## 2020-11-29 DIAGNOSIS — R809 Proteinuria, unspecified: Secondary | ICD-10-CM | POA: Diagnosis not present

## 2020-11-29 DIAGNOSIS — N189 Chronic kidney disease, unspecified: Secondary | ICD-10-CM | POA: Diagnosis not present

## 2020-11-29 DIAGNOSIS — N184 Chronic kidney disease, stage 4 (severe): Secondary | ICD-10-CM | POA: Diagnosis not present

## 2020-11-29 DIAGNOSIS — E1122 Type 2 diabetes mellitus with diabetic chronic kidney disease: Secondary | ICD-10-CM | POA: Diagnosis not present

## 2020-12-01 ENCOUNTER — Other Ambulatory Visit: Payer: Self-pay | Admitting: *Deleted

## 2020-12-01 DIAGNOSIS — I739 Peripheral vascular disease, unspecified: Secondary | ICD-10-CM

## 2020-12-15 ENCOUNTER — Other Ambulatory Visit: Payer: Self-pay

## 2020-12-15 ENCOUNTER — Ambulatory Visit (INDEPENDENT_AMBULATORY_CARE_PROVIDER_SITE_OTHER): Payer: Medicare PPO | Admitting: Vascular Surgery

## 2020-12-15 ENCOUNTER — Ambulatory Visit (INDEPENDENT_AMBULATORY_CARE_PROVIDER_SITE_OTHER)
Admission: RE | Admit: 2020-12-15 | Discharge: 2020-12-15 | Disposition: A | Discharge status: Home / Self Care | Payer: Medicare PPO | Source: Ambulatory Visit | Attending: Vascular Surgery | Admitting: Vascular Surgery

## 2020-12-15 ENCOUNTER — Ambulatory Visit (HOSPITAL_COMMUNITY)
Admission: RE | Admit: 2020-12-15 | Discharge: 2020-12-15 | Disposition: A | Discharge status: Home / Self Care | Payer: Medicare PPO | Source: Ambulatory Visit | Attending: Vascular Surgery | Admitting: Vascular Surgery

## 2020-12-15 ENCOUNTER — Encounter: Payer: Self-pay | Admitting: Vascular Surgery

## 2020-12-15 VITALS — BP 151/76 | HR 65 | Temp 97.9°F | Resp 20 | Ht 70.0 in | Wt 186.0 lb

## 2020-12-15 DIAGNOSIS — I739 Peripheral vascular disease, unspecified: Secondary | ICD-10-CM

## 2020-12-15 MED ORDER — PREGABALIN 25 MG PO CAPS: 25.0000 mg | ORAL_CAPSULE | Freq: Two times a day (BID) | ORAL | 1 refills | Status: DC

## 2020-12-15 MED ORDER — PREGABALIN 25 MG PO CAPS: 25.0000 mg | ORAL_CAPSULE | Freq: Two times a day (BID) | ORAL | Status: DC

## 2020-12-15 NOTE — Progress Notes (Signed)
Patient is a 74 year old male who returns for follow-up today.  He was last seen September 2020.  He previously underwent redo left femoral to below-knee popliteal bypass with PTFE in March 2020.  He did have a seroma in his left thigh at the last office visit.  Complains of pain in both legs.  He states however his biggest problem is with balance issues and numbness and tingling over his left anterior tibial region.  He does continue to smoke.  He states he is not interested in quitting.  He has lost about 50 pounds in the last year.  He states he lost the weight with stopping his consumption of soda.  He states he has taken Neurontin in the past for neuropathy and it did not help.  He recently paid out of pocket $7500 for some type of noninsurance covered neuropathy treatment.  After 16 treatments he had no improvement.  He has no nonhealing wounds on his feet.  Past Medical History:  Diagnosis Date  . Acute encephalopathy   . Acute hypoxemic respiratory failure ([place])   . Acute pulmonary edema (HCC)   . Acute respiratory failure with hypoxia ([place])   . Aftercare following surgery of the circulatory system, [place] 01/01/2014  . Anemia   . Arrhythmia    takes metoprolol daily  . Arthritis   . Asthma   . Bilateral leg pain 06/16/2015  . CAD (coronary artery disease)   . Cardiac asystole ([place])   . Cardiogenic shock ([place])   . Carotid artery occlusion    with Claudication  . Chronic back pain    stenosis  . Chronic kidney disease   . CKD (chronic kidney disease) stage 3, GFR 30-59 ml/min (HCC) 07/18/2015  . COPD (chronic obstructive pulmonary disease) (HCC)    Spirva daily and Albuterol as needed  . Dementia ([place])   . Depression   . Diabetes mellitus without complication (HCC)    Type 2 diet controlled.Never been on meds  . GERD (gastroesophageal reflux disease)   . Gout    takes Uloric daily  . HCAP (healthcare-associated pneumonia)   . Head injury    as a child  . History of colon polyps     benign  . History of gastric ulcer   . History of hiatal hernia   . Hyperlipidemia    takes Atorvastatin and Fenofibrate daily  . Hypertension    takes Lisinopril,Amlodipine,and Hydralazine daily  . Impaired memory    takes Namenda daily  . Intermittent claudication ([place]) 07/18/2015   Overview:  Operative management: The patient will be scheduled for a left femoral to  popliteal bypass in the near future after cardiac risk stratification. He  does have some superficial femoral and tibial artery occlusive disease in  the right leg. However his ABI on the right side is greater than 0.9.  Hopefully he will improve with just a walking program the right leg alone.  If not we could c  . Lumbosacral spondylosis with radiculopathy 02/17/2016  . Nevus of choroid of left eye 12/08/2014  . Numbness    lower left leg and upper right thigh  . Occlusion and stenosis of carotid artery without mention of cerebral infarction 01/01/2013  . PAD (peripheral artery disease) ([place])   . Pneumonia    hx of-couple of yrs ago  . Preoperative cardiovascular examination 07/17/2015   Overview:  Echo 2013 with EF 55-60% Lexiscan MPS 11/12/12 with normal perfusion and function  Overview:  Echo 2013 with EF  55-60% Lexiscan MPS 11/12/12 with normal perfusion and function  . Serous retinal detachment of left eye 12/08/2014  . Shortness of breath dyspnea    with exertion  . Status post intraocular lens implant 12/08/2014  . Tuberculosis 2006    9 months   . Vertigo   . Wears glasses     Past Surgical History:  Procedure Laterality Date  . ABDOMINAL AORTOGRAM W/LOWER EXTREMITY N/A 02/20/2019   Procedure: ABDOMINAL AORTOGRAM W/LOWER EXTREMITY;  Surgeon: [name], MD;  Location: [facility];  Service: Cardiovascular;  Laterality: N/A;  . ABDOMINAL EXPOSURE N/A 01/22/2017   Procedure: ABDOMINAL EXPOSURE;  Surgeon: [name], MD;  Location: [facility];  Service: Vascular;  Laterality: N/A;  . ANTERIOR  LAT LUMBAR FUSION N/A 01/22/2017   Procedure: LUMBAR THREE-FOUR, LUMBAR FOUR-FIVE ANTERIOR LATERAL INTERBODY FUSION;  Surgeon: [name], MD;  Location: [facility];  Service: Neurosurgery;  Laterality: N/A;  L3-4 L4-5 Lateral interbody fusion  . ANTERIOR LUMBAR FUSION N/A 01/22/2017   Procedure: LUMBAR FIVE-SACRUM ONE ANTERIOR LUMBAR INTERBODY FUSION WITH ABDOMINAL APPROACH BY DR [name];  Surgeon: [name], MD;  Location: [facility];  Service: Neurosurgery;  Laterality: N/A;  . CAROTID BODY TUMOR EXCISION Left 09/21/2015   Procedure: EXCISE LEFT NECK NODULE WITH LOCAL ;  Surgeon: [name], MD;  Location: [facility];  Service: Vascular;  Laterality: Left;  . CAROTID ENDARTERECTOMY  Nov. 15,2011   LEFT cea  . cataract surgery Bilateral   . COLONOSCOPY    . EYE SURGERY    . FEMORAL-POPLITEAL BYPASS GRAFT Left 08/23/2015   Procedure: LEFT FEMORAL-POPLITEAL ARTERY BYPASS WITH GORETEX GRAFT;  Surgeon: [name], MD;  Location: [facility];  Service: Vascular;  Laterality: Left;  . FEMORAL-POPLITEAL BYPASS GRAFT Left 02/23/2019   Procedure: REDO LEFT FEMORAL TO POPLITEAL ARTERY BYPASS GRAFT Using Propaten graft;  Surgeon: [name], MD;  Location: [facility];  Service: Vascular;  Laterality: Left;  . JOINT REPLACEMENT  1980   RIGHT  knee  . LUMBAR EPIDURAL INJECTION    . LUMBAR LAMINECTOMY/DECOMPRESSION MICRODISCECTOMY Right 02/17/2016   Procedure: Laminectomy and Foraminotomy - Lumbar four-five right;  Surgeon: [name], MD;  Location: [facility];  Service: Neurosurgery;  Laterality: Right;  right  . LUMBAR PERCUTANEOUS PEDICLE SCREW 3 LEVEL N/A 01/22/2017   Procedure: LUMBAR THREE-SACRAL ONE PERCUTANEOUS PEDICLE SCREW FIXATION WITH ROBOTIC ASSISTANCE;  Surgeon: [name], MD;  Location: [facility];  Service: Neurosurgery;  Laterality: N/A;  L3 to S1 Percutaneous pedicle screw fixation  . PERIPHERAL VASCULAR CATHETERIZATION N/A 06/24/2015   Procedure: Abdominal Aortogram;   Surgeon: [name], MD;  Location: [facility];  Service: Cardiovascular;  Laterality: N/A;  . Stents  Aug.  23, 1999   Bilateral iliofemoral  stents, [place].  . TONSILLECTOMY      Current Outpatient Medications on File Prior to Visit  Medication Sig Dispense Refill  . amLODipine (NORVASC) 10 MG tablet Take 10 mg by mouth daily at 10 pm. (2100)    . atorvastatin (LIPITOR) 80 MG tablet Take 80 mg by mouth daily at 10 pm. (2100)    . losartan (COZAAR) 100 MG tablet Take 100 mg by mouth daily.     [name] triamcinolone ointment (KENALOG) 0.5 %     . ULORIC 80 MG TABS Take 80 mg by mouth daily at 10 pm. (2100)  5   No current facility-administered medications on file prior to  visit.   Social History   Socioeconomic History  . Marital status: Married    Spouse name: Not on file  . Number of children: Not on file  . Years of education: Not on file  . Highest education level: Not on file  Occupational History  . Not on file  Tobacco Use  . Smoking status: Current Every Day Smoker    Packs/day: 1.00    Types: Cigarettes  . Smokeless tobacco: Never Used  Vaping Use  . Vaping Use: Never used  Substance and Sexual Activity  . Alcohol use: No    Alcohol/week: 0.0 standard drinks  . Drug use: No  . Sexual activity: Yes  Other Topics Concern  . Not on file  Social History Narrative  . Not on file   Social Determinants of Health   Financial Resource Strain: Not on file  Food Insecurity: Not on file  Transportation Needs: Not on file  Physical Activity: Not on file  Stress: Not on file  Social Connections: Not on file  Intimate Partner Violence: Not on file     Review of systems: He has shortness of breath with exertion.  He has no chest pain.  Physical exam:  Vitals:   12/15/20 1328  BP: (!) 151/76  Pulse: 65  Resp: 20  Temp: 97.9 F (36.6 C)  SpO2: 97%  Weight: 186 lb (84.4 kg)  Height: 5\' 10"  (1.778 m)    Extremities: 2+ left dorsalis pedis pulse,  palpable fluctuance left mid thigh consistent with seroma, no palpable pulses right foot no ulcers  Data: Patient had duplex ultrasound of his left leg bypass today.  This showed some narrowing of his left external iliac artery with velocities of 383 cm/s.  Bypass graft was patent.  There was a 9 x 5 cm seroma in the left distal thigh.  There was monophasic flow in the tibial vessels but biphasic flow in the graft.  He also had bilateral ABIs performed today right side was 0.58 left side was 0.79.  This is essentially unchanged on the left side.  Right side has declined by about 30%.  Assessment: #1 peripheral arterial disease decline in ABIs on the right side but no real symptoms at this point.  Left side ABIs fairly stable over the last year with may be some mild narrowing of the common femoral or distal external iliac artery but still has a palpable pulse in his foot and biphasic graft flow.  Would repeat ABIs and graft duplex in 3 months.  If velocities continue to increase in the external iliac artery consider arteriogram.  Right leg remains asymptomatic.  2.  Neuropathy patient has tried Neurontin in the past with no relief.  He tried some nonmedical therapy recently with no relief.  He would like to try something else for the neuropathy symptoms.  He states this really affects his balance.  We will try him on Lyrica.  I discussed with him that the sometimes this can give him a dopey type feeling and to be careful with it when he first starts it and certainly not operate machinery while he is taking until he adjusts to the medication.  Plan: See above  Ruta Hinds, MD [facility] Office: [phone]

## 2020-12-15 NOTE — Addendum Note (Signed)
Addended by: [name] on: 12/15/2020 04:17 PM   Modules accepted: Orders

## 2020-12-20 ENCOUNTER — Other Ambulatory Visit: Payer: Self-pay

## 2020-12-20 DIAGNOSIS — I739 Peripheral vascular disease, unspecified: Secondary | ICD-10-CM

## 2020-12-26 DIAGNOSIS — M109 Gout, unspecified: Secondary | ICD-10-CM | POA: Diagnosis not present

## 2020-12-26 DIAGNOSIS — E1122 Type 2 diabetes mellitus with diabetic chronic kidney disease: Secondary | ICD-10-CM | POA: Diagnosis not present

## 2020-12-26 DIAGNOSIS — J3489 Other specified disorders of nose and nasal sinuses: Secondary | ICD-10-CM | POA: Diagnosis not present

## 2020-12-26 DIAGNOSIS — E785 Hyperlipidemia, unspecified: Secondary | ICD-10-CM | POA: Diagnosis not present

## 2020-12-26 DIAGNOSIS — N184 Chronic kidney disease, stage 4 (severe): Secondary | ICD-10-CM | POA: Diagnosis not present

## 2020-12-26 DIAGNOSIS — I129 Hypertensive chronic kidney disease with stage 1 through stage 4 chronic kidney disease, or unspecified chronic kidney disease: Secondary | ICD-10-CM | POA: Diagnosis not present

## 2021-02-17 ENCOUNTER — Other Ambulatory Visit: Payer: Self-pay | Admitting: Vascular Surgery

## 2021-02-27 DIAGNOSIS — I739 Peripheral vascular disease, unspecified: Secondary | ICD-10-CM | POA: Diagnosis not present

## 2021-02-27 DIAGNOSIS — R809 Proteinuria, unspecified: Secondary | ICD-10-CM | POA: Diagnosis not present

## 2021-02-27 DIAGNOSIS — I129 Hypertensive chronic kidney disease with stage 1 through stage 4 chronic kidney disease, or unspecified chronic kidney disease: Secondary | ICD-10-CM | POA: Diagnosis not present

## 2021-02-27 DIAGNOSIS — D631 Anemia in chronic kidney disease: Secondary | ICD-10-CM | POA: Diagnosis not present

## 2021-02-27 DIAGNOSIS — E1122 Type 2 diabetes mellitus with diabetic chronic kidney disease: Secondary | ICD-10-CM | POA: Diagnosis not present

## 2021-02-27 DIAGNOSIS — E785 Hyperlipidemia, unspecified: Secondary | ICD-10-CM | POA: Diagnosis not present

## 2021-02-27 DIAGNOSIS — N2581 Secondary hyperparathyroidism of renal origin: Secondary | ICD-10-CM | POA: Diagnosis not present

## 2021-02-27 DIAGNOSIS — N179 Acute kidney failure, unspecified: Secondary | ICD-10-CM | POA: Diagnosis not present

## 2021-02-27 DIAGNOSIS — N184 Chronic kidney disease, stage 4 (severe): Secondary | ICD-10-CM | POA: Diagnosis not present

## 2021-02-27 DIAGNOSIS — N189 Chronic kidney disease, unspecified: Secondary | ICD-10-CM | POA: Diagnosis not present

## 2021-03-16 ENCOUNTER — Ambulatory Visit (INDEPENDENT_AMBULATORY_CARE_PROVIDER_SITE_OTHER): Payer: Medicare PPO | Admitting: Vascular Surgery

## 2021-03-16 ENCOUNTER — Ambulatory Visit (HOSPITAL_COMMUNITY)
Admission: RE | Admit: 2021-03-16 | Discharge: 2021-03-16 | Disposition: A | Discharge status: Home / Self Care | Payer: Medicare PPO | Source: Ambulatory Visit | Attending: Vascular Surgery | Admitting: Vascular Surgery

## 2021-03-16 ENCOUNTER — Ambulatory Visit (INDEPENDENT_AMBULATORY_CARE_PROVIDER_SITE_OTHER)
Admission: RE | Admit: 2021-03-16 | Discharge: 2021-03-16 | Disposition: A | Discharge status: Home / Self Care | Payer: Medicare PPO | Source: Ambulatory Visit | Attending: Vascular Surgery | Admitting: Vascular Surgery

## 2021-03-16 ENCOUNTER — Other Ambulatory Visit: Payer: Self-pay

## 2021-03-16 ENCOUNTER — Encounter: Payer: Self-pay | Admitting: Vascular Surgery

## 2021-03-16 VITALS — BP 134/80 | HR 85 | Temp 98.0°F | Resp 20 | Ht 70.0 in | Wt 183.0 lb

## 2021-03-16 DIAGNOSIS — I739 Peripheral vascular disease, unspecified: Secondary | ICD-10-CM | POA: Diagnosis not present

## 2021-03-16 NOTE — Progress Notes (Signed)
Patient is a 75 year old male who returns for follow-up today.  We have been following him for a known possible proximal anastomotic narrowing after redo left femoral to above-knee popliteal bypass.  He currently does not really have any claudication symptoms but does experience some fatigue in his left leg after walking longer distances.  Unfortunately continues to smoke and does not have interest in quitting.  Of note he also has renal dysfunction and he states that his serum creatinine worsened after his last arteriogram.    His last arteriogram was in March 2020.  We were unable to do selective views of the left lower extremity at the time of the arteriogram due to the fact that he has had previously placed common iliac stents elsewhere where the limbs are crossed making this technically very difficult.  At that time he underwent redo left femoral to above-knee popliteal artery bypass.    He has also previously had retroperitoneal exposure for a spine procedure by my partner Dr. [name].  He had a left iliac vein injury that was primarily repaired at that point.  This was in 2018.  He states he currently is not on aspirin.  I discussed with him today restarting his 81 mg of aspirin daily.  He does not recall who stopped his aspirin.  Past Medical History:  Diagnosis Date  . Acute encephalopathy   . Acute hypoxemic respiratory failure ([place])   . Acute pulmonary edema (HCC)   . Acute respiratory failure with hypoxia ([place])   . Aftercare following surgery of the circulatory system, [place] 01/01/2014  . Anemia   . Arrhythmia    takes metoprolol daily  . Arthritis   . Asthma   . Bilateral leg pain 06/16/2015  . CAD (coronary artery disease)   . Cardiac asystole ([place])   . Cardiogenic shock ([place])   . Carotid artery occlusion    with Claudication  . Chronic back pain    stenosis  . Chronic kidney disease   . CKD (chronic kidney disease) stage 3, GFR 30-59 ml/min (HCC) 07/18/2015  . COPD (chronic  obstructive pulmonary disease) (HCC)    Spirva daily and Albuterol as needed  . Dementia ([place])   . Depression   . Diabetes mellitus without complication (HCC)    Type 2 diet controlled.Never been on meds  . GERD (gastroesophageal reflux disease)   . Gout    takes Uloric daily  . HCAP (healthcare-associated pneumonia)   . Head injury    as a child  . History of colon polyps    benign  . History of gastric ulcer   . History of hiatal hernia   . Hyperlipidemia    takes Atorvastatin and Fenofibrate daily  . Hypertension    takes Lisinopril,Amlodipine,and Hydralazine daily  . Impaired memory    takes Namenda daily  . Intermittent claudication ([place]) 07/18/2015   Overview:  Operative management: The patient will be scheduled for a left femoral to  popliteal bypass in the near future after cardiac risk stratification. He  does have some superficial femoral and tibial artery occlusive disease in  the right leg. However his ABI on the right side is greater than 0.9.  Hopefully he will improve with just a walking program the right leg alone.  If not we could c  . Lumbosacral spondylosis with radiculopathy 02/17/2016  . Nevus of choroid of left eye 12/08/2014  . Numbness    lower left leg and upper right thigh  . Occlusion and stenosis  of carotid artery without mention of cerebral infarction 01/01/2013  . PAD (peripheral artery disease) ([place])   . Pneumonia    hx of-couple of yrs ago  . Preoperative cardiovascular examination 07/17/2015   Overview:  Echo 2013 with EF 55-60% Lexiscan MPS 11/12/12 with normal perfusion and function  Overview:  Echo 2013 with EF 55-60% Lexiscan MPS 11/12/12 with normal perfusion and function  . Serous retinal detachment of left eye 12/08/2014  . Shortness of breath dyspnea    with exertion  . Status post intraocular lens implant 12/08/2014  . Tuberculosis 2006    9 months   . Vertigo   . Wears glasses     Past Surgical History:  Procedure Laterality Date  .  ABDOMINAL AORTOGRAM W/LOWER EXTREMITY N/A 02/20/2019   Procedure: ABDOMINAL AORTOGRAM W/LOWER EXTREMITY;  Surgeon: [name], MD;  Location: [facility];  Service: Cardiovascular;  Laterality: N/A;  . ABDOMINAL EXPOSURE N/A 01/22/2017   Procedure: ABDOMINAL EXPOSURE;  Surgeon: [name], MD;  Location: [facility];  Service: Vascular;  Laterality: N/A;  . ANTERIOR LAT LUMBAR FUSION N/A 01/22/2017   Procedure: LUMBAR THREE-FOUR, LUMBAR FOUR-FIVE ANTERIOR LATERAL INTERBODY FUSION;  Surgeon: [name], MD;  Location: [facility];  Service: Neurosurgery;  Laterality: N/A;  L3-4 L4-5 Lateral interbody fusion  . ANTERIOR LUMBAR FUSION N/A 01/22/2017   Procedure: LUMBAR FIVE-SACRUM ONE ANTERIOR LUMBAR INTERBODY FUSION WITH ABDOMINAL APPROACH BY DR [name];  Surgeon: [name], MD;  Location: [facility];  Service: Neurosurgery;  Laterality: N/A;  . CAROTID BODY TUMOR EXCISION Left 09/21/2015   Procedure: EXCISE LEFT NECK NODULE WITH LOCAL ;  Surgeon: [name], MD;  Location: [facility];  Service: Vascular;  Laterality: Left;  . CAROTID ENDARTERECTOMY  Nov. 15,2011   LEFT cea  . cataract surgery Bilateral   . COLONOSCOPY    . EYE SURGERY    . FEMORAL-POPLITEAL BYPASS GRAFT Left 08/23/2015   Procedure: LEFT FEMORAL-POPLITEAL ARTERY BYPASS WITH GORETEX GRAFT;  Surgeon: [name], MD;  Location: [facility];  Service: Vascular;  Laterality: Left;  . FEMORAL-POPLITEAL BYPASS GRAFT Left 02/23/2019   Procedure: REDO LEFT FEMORAL TO POPLITEAL ARTERY BYPASS GRAFT Using Propaten graft;  Surgeon: [name], MD;  Location: [facility];  Service: Vascular;  Laterality: Left;  . JOINT REPLACEMENT  1980   RIGHT  knee  . LUMBAR EPIDURAL INJECTION    . LUMBAR LAMINECTOMY/DECOMPRESSION MICRODISCECTOMY Right 02/17/2016   Procedure: Laminectomy and Foraminotomy - Lumbar four-five right;  Surgeon: [name], MD;  Location: [facility];  Service: Neurosurgery;  Laterality: Right;  right   . LUMBAR PERCUTANEOUS PEDICLE SCREW 3 LEVEL N/A 01/22/2017   Procedure: LUMBAR THREE-SACRAL ONE PERCUTANEOUS PEDICLE SCREW FIXATION WITH ROBOTIC ASSISTANCE;  Surgeon: [name], MD;  Location: [facility];  Service: Neurosurgery;  Laterality: N/A;  L3 to S1 Percutaneous pedicle screw fixation  . PERIPHERAL VASCULAR CATHETERIZATION N/A 06/24/2015   Procedure: Abdominal Aortogram;  Surgeon: [name], MD;  Location: [facility];  Service: Cardiovascular;  Laterality: N/A;  . Stents  Aug.  23, 1999   Bilateral iliofemoral  stents, [place].  . TONSILLECTOMY     Physical exam:  Vitals:   03/16/21 1252  BP: 134/80  Pulse: 85  Resp: 20  Temp: 98 F (36.7 C)  SpO2: 95%  Weight: 183 lb (83 kg)  Height: 5\' 10"  (1.778 m)    Extremities: 2+ femoral pulses bilaterally absent popliteal  and pedal pulses Skin: No ulcer  Data: Patient had bilateral ABIs performed today.  Right side was 0.52 left side 0.69.  Toe pressure on the right was 44 left was 95.  Patient also had a lower extremity duplex exam which showed increased velocities at the proximal anastomosis of 310 cm/s.  This has slowly been increasing over the last several months.  Assessment: Possible inflow stenosis above his left femoral to above-knee popliteal bypass.  This is either asymptomatic or minimally symptomatic.  I had a lengthy discussion with the patient today about the possibility of doing an arteriogram.  However, he states he would rather lose his leg then be placed on hemodialysis.  We did discuss the possibility of contrast nephropathy.  Since he currently does not have a limb threatening process we will continue to observe this and if the velocities continue to increase have further discussions in the future.  Plan: Patient will follow up with me with repeat ABIs and a duplex scan in 3 months time.  He was given signs and symptoms to look for if he develops any progressive ischemia in his left leg.  Joseph Hinds, MD [facility] Office: [phone]

## 2021-03-21 ENCOUNTER — Other Ambulatory Visit: Payer: Self-pay

## 2021-03-21 DIAGNOSIS — I739 Peripheral vascular disease, unspecified: Secondary | ICD-10-CM

## 2021-03-25 DIAGNOSIS — E785 Hyperlipidemia, unspecified: Secondary | ICD-10-CM | POA: Diagnosis not present

## 2021-03-25 DIAGNOSIS — I129 Hypertensive chronic kidney disease with stage 1 through stage 4 chronic kidney disease, or unspecified chronic kidney disease: Secondary | ICD-10-CM | POA: Diagnosis not present

## 2021-03-25 DIAGNOSIS — M109 Gout, unspecified: Secondary | ICD-10-CM | POA: Diagnosis not present

## 2021-03-25 DIAGNOSIS — Z6826 Body mass index (BMI) 26.0-26.9, adult: Secondary | ICD-10-CM | POA: Diagnosis not present

## 2021-03-25 DIAGNOSIS — Z125 Encounter for screening for malignant neoplasm of prostate: Secondary | ICD-10-CM | POA: Diagnosis not present

## 2021-03-25 DIAGNOSIS — N184 Chronic kidney disease, stage 4 (severe): Secondary | ICD-10-CM | POA: Diagnosis not present

## 2021-03-25 DIAGNOSIS — E1122 Type 2 diabetes mellitus with diabetic chronic kidney disease: Secondary | ICD-10-CM | POA: Diagnosis not present

## 2021-05-17 DIAGNOSIS — D631 Anemia in chronic kidney disease: Secondary | ICD-10-CM | POA: Diagnosis not present

## 2021-05-17 DIAGNOSIS — R809 Proteinuria, unspecified: Secondary | ICD-10-CM | POA: Diagnosis not present

## 2021-05-17 DIAGNOSIS — E1122 Type 2 diabetes mellitus with diabetic chronic kidney disease: Secondary | ICD-10-CM | POA: Diagnosis not present

## 2021-05-17 DIAGNOSIS — N179 Acute kidney failure, unspecified: Secondary | ICD-10-CM | POA: Diagnosis not present

## 2021-05-17 DIAGNOSIS — N2581 Secondary hyperparathyroidism of renal origin: Secondary | ICD-10-CM | POA: Diagnosis not present

## 2021-05-17 DIAGNOSIS — N184 Chronic kidney disease, stage 4 (severe): Secondary | ICD-10-CM | POA: Diagnosis not present

## 2021-05-17 DIAGNOSIS — I129 Hypertensive chronic kidney disease with stage 1 through stage 4 chronic kidney disease, or unspecified chronic kidney disease: Secondary | ICD-10-CM | POA: Diagnosis not present

## 2021-05-17 DIAGNOSIS — I739 Peripheral vascular disease, unspecified: Secondary | ICD-10-CM | POA: Diagnosis not present

## 2021-05-17 DIAGNOSIS — E785 Hyperlipidemia, unspecified: Secondary | ICD-10-CM | POA: Diagnosis not present

## 2021-05-17 DIAGNOSIS — N189 Chronic kidney disease, unspecified: Secondary | ICD-10-CM | POA: Diagnosis not present

## 2021-05-19 DIAGNOSIS — N184 Chronic kidney disease, stage 4 (severe): Secondary | ICD-10-CM | POA: Diagnosis not present

## 2021-05-30 DIAGNOSIS — N184 Chronic kidney disease, stage 4 (severe): Secondary | ICD-10-CM | POA: Diagnosis not present

## 2021-05-30 DIAGNOSIS — E1122 Type 2 diabetes mellitus with diabetic chronic kidney disease: Secondary | ICD-10-CM | POA: Diagnosis not present

## 2021-06-15 ENCOUNTER — Other Ambulatory Visit: Payer: Self-pay

## 2021-06-15 ENCOUNTER — Ambulatory Visit (HOSPITAL_COMMUNITY)
Admission: RE | Admit: 2021-06-15 | Discharge: 2021-06-15 | Disposition: A | Discharge status: Home / Self Care | Payer: Medicare PPO | Source: Ambulatory Visit | Attending: Vascular Surgery | Admitting: Vascular Surgery

## 2021-06-15 ENCOUNTER — Ambulatory Visit (INDEPENDENT_AMBULATORY_CARE_PROVIDER_SITE_OTHER)
Admission: RE | Admit: 2021-06-15 | Discharge: 2021-06-15 | Disposition: A | Discharge status: Home / Self Care | Payer: Medicare PPO | Source: Ambulatory Visit | Attending: Vascular Surgery | Admitting: Vascular Surgery

## 2021-06-15 ENCOUNTER — Encounter: Payer: Self-pay | Admitting: Vascular Surgery

## 2021-06-15 ENCOUNTER — Ambulatory Visit (INDEPENDENT_AMBULATORY_CARE_PROVIDER_SITE_OTHER): Payer: Medicare PPO | Admitting: Vascular Surgery

## 2021-06-15 VITALS — BP 149/73 | HR 82 | Temp 98.0°F | Resp 20 | Ht 70.0 in | Wt 183.0 lb

## 2021-06-15 DIAGNOSIS — I739 Peripheral vascular disease, unspecified: Secondary | ICD-10-CM | POA: Diagnosis not present

## 2021-06-15 NOTE — Progress Notes (Signed)
Patient is a 75 year old male who returns for follow-up today.  He was last seen March 2022.  He has a history of previous redo left femoral to above-knee popliteal bypass with possible proximal anastomotic narrowing.  He is not ambulating much.  He is using a motorized wheelchair to get around most of the time.  He continues to smoke and is not interested in quitting.  He has problems with his back and cannot walk very long distances but has both legs become weak.  He previously had bilateral common iliac stents elsewhere.  The limbs are crossed which makes any up and over peripheral procedures more difficult.   Past Medical History:  Diagnosis Date   Acute encephalopathy    Acute hypoxemic respiratory failure (HCC)    Acute pulmonary edema (HCC)    Acute respiratory failure with hypoxia [facility])    Aftercare following surgery of the circulatory system, NEC 01/01/2014   Anemia    Arrhythmia    takes metoprolol daily   Arthritis    Asthma    Bilateral leg pain 06/16/2015   CAD (coronary artery disease)    Cardiac asystole (HCC)    Cardiogenic shock (HCC)    Carotid artery occlusion    with Claudication   Chronic back pain    stenosis   Chronic kidney disease    CKD (chronic kidney disease) stage 3, GFR 30-59 ml/min ([place]) 07/18/2015   COPD (chronic obstructive pulmonary disease) (HCC)    Spirva daily and Albuterol as needed   Dementia ([place])    Depression    Diabetes mellitus without complication (HCC)    Type 2 diet controlled.Never been on meds   GERD (gastroesophageal reflux disease)    Gout    takes Uloric daily   HCAP (healthcare-associated pneumonia)    Head injury    as a child   History of colon polyps    benign   History of gastric ulcer    History of hiatal hernia    Hyperlipidemia    takes Atorvastatin and Fenofibrate daily   Hypertension    takes Lisinopril,Amlodipine,and Hydralazine daily   Impaired memory    takes Namenda daily   Intermittent claudication ([place])  07/18/2015   Overview:  Operative management: The patient will be scheduled for a left femoral to  popliteal bypass in the near future after cardiac risk stratification. He  does have some superficial femoral and tibial artery occlusive disease in  the right leg. However his ABI on the right side is greater than 0.9.  Hopefully he will improve with just a walking program the right leg alone.  If not we could c   Lumbosacral spondylosis with radiculopathy 02/17/2016   Nevus of choroid of left eye 12/08/2014   Numbness    lower left leg and upper right thigh   Occlusion and stenosis of carotid artery without mention of cerebral infarction 01/01/2013   PAD (peripheral artery disease) ([place])    Pneumonia    hx of-couple of yrs ago   Preoperative cardiovascular examination 07/17/2015   Overview:  Echo 2013 with EF 55-60% Lexiscan MPS 11/12/12 with normal perfusion and function  Overview:  Echo 2013 with EF 55-60% Lexiscan MPS 11/12/12 with normal perfusion and function   Serous retinal detachment of left eye 12/08/2014   Shortness of breath dyspnea    with exertion   Status post intraocular lens implant 12/08/2014   Tuberculosis 2006    9 months    Vertigo    Wears  glasses     Past Surgical History:  Procedure Laterality Date   ABDOMINAL AORTOGRAM W/LOWER EXTREMITY N/A 02/20/2019   Procedure: ABDOMINAL AORTOGRAM W/LOWER EXTREMITY;  Surgeon: [name], MD;  Location: [place] [facility];  Service: Cardiovascular;  Laterality: N/A;   ABDOMINAL EXPOSURE N/A 01/22/2017   Procedure: ABDOMINAL EXPOSURE;  Surgeon: [name], MD;  Location: [place];  Service: Vascular;  Laterality: N/A;   ANTERIOR LAT LUMBAR FUSION N/A 01/22/2017   Procedure: LUMBAR THREE-FOUR, LUMBAR FOUR-FIVE ANTERIOR LATERAL INTERBODY FUSION;  Surgeon: [name], MD;  Location: [place];  Service: Neurosurgery;  Laterality: N/A;  L3-4 L4-5 Lateral interbody fusion   ANTERIOR LUMBAR FUSION N/A 01/22/2017   Procedure:  LUMBAR FIVE-SACRUM ONE ANTERIOR LUMBAR INTERBODY FUSION WITH ABDOMINAL APPROACH BY DR [name];  Surgeon: [name], MD;  Location: [place];  Service: Neurosurgery;  Laterality: N/A;   CAROTID BODY TUMOR EXCISION Left 09/21/2015   Procedure: EXCISE LEFT NECK NODULE WITH LOCAL ;  Surgeon: [name], MD;  Location: [place];  Service: Vascular;  Laterality: Left;   CAROTID ENDARTERECTOMY  Nov. 15,2011   LEFT cea   cataract surgery Bilateral    COLONOSCOPY     EYE SURGERY     FEMORAL-POPLITEAL BYPASS GRAFT Left 08/23/2015   Procedure: LEFT FEMORAL-POPLITEAL ARTERY BYPASS WITH GORETEX GRAFT;  Surgeon: [name], MD;  Location: [place];  Service: Vascular;  Laterality: Left;   FEMORAL-POPLITEAL BYPASS GRAFT Left 02/23/2019   Procedure: REDO LEFT FEMORAL TO POPLITEAL ARTERY BYPASS GRAFT Using Propaten graft;  Surgeon: [name], MD;  Location: [place];  Service: Vascular;  Laterality: Left;   JOINT REPLACEMENT  1980   RIGHT  knee   LUMBAR EPIDURAL INJECTION     LUMBAR LAMINECTOMY/DECOMPRESSION MICRODISCECTOMY Right 02/17/2016   Procedure: Laminectomy and Foraminotomy - Lumbar four-five right;  Surgeon: [name], MD;  [facility];  Service: Neurosurgery;  Laterality: Right;  right   LUMBAR PERCUTANEOUS PEDICLE SCREW 3 LEVEL N/A 01/22/2017   Procedure: LUMBAR THREE-SACRAL ONE PERCUTANEOUS PEDICLE SCREW FIXATION WITH ROBOTIC ASSISTANCE;  Surgeon: [name], MD;  Location: [place];  Service: Neurosurgery;  Laterality: N/A;  L3 to S1 Percutaneous pedicle screw fixation   PERIPHERAL VASCULAR CATHETERIZATION N/A 06/24/2015   Procedure: Abdominal Aortogram;  Surgeon: [name], MD;  Location: [place] [facility];  Service: Cardiovascular;  Laterality: N/A;   Stents  Aug.  23, 1999   Bilateral iliofemoral  stents, [place].   TONSILLECTOMY     Current Outpatient Medications on File Prior to Visit  Medication Sig Dispense Refill   amLODipine (NORVASC) 10 MG  tablet Take 10 mg by mouth daily at 10 pm. (2100)     atorvastatin (LIPITOR) 80 MG tablet Take 80 mg by mouth daily at 10 pm. (2100)     losartan (COZAAR) 100 MG tablet Take 100 mg by mouth daily.      triamcinolone ointment (KENALOG) 0.5 %      ULORIC 80 MG TABS Take 80 mg by mouth daily at 10 pm. (2100)  5   No current facility-administered medications on file prior to visit.    Allergies  Allergen Reactions   Lopressor [Metoprolol Tartrate] Other (See Comments)    Severe bradycardia    Physical exam:  Vitals:   06/15/21 1327  BP: (!) 149/73  Pulse: 82  Resp: 20  Temp: 98 F (36.7 C)  SpO2: 96%  Weight: 183 lb (  83 kg)  Height: 5\' 10"  (1.778 m)    Extremities: 2+ femoral pulses absent pedal pulses bilaterally  Skin: No ulceration  Data: Patient had bilateral ABIs performed today right side was 0.51 left side 0.70 this is unchanged from March 2022.  Duplex ultrasound suggests distal external iliac proximal common femoral narrowing.  Bypass is patent.  Creatinine 2.55 6/22  Assessment: Possible proximal anastomotic narrowing of femoral popliteal bypass graft currently no decline in ABIs and no real change in symptoms.  Patient's options are limited due to his elevated creatinine and his reluctance to proceed with using iodinated contrast.  He has already stated previously he would rather have an amputation of his legs and go on dialysis.  We discussed the possibility that he could potentially lose his leg if the bypass occludes but that he may just have claudication type symptoms.  We also discussed smoking cessation but he is not really interested in this.  Plan: Patient has agreed to repeat the duplex scan and ABIs again in 3 months.  If velocities continue to increase or more suggestive of recurrent narrowing he may consent on arteriogram at that point.  He will call us sooner if he develops worsening symptoms.  Ruta Hinds, MD [facility]  [place] Office: [phone]

## 2021-06-16 ENCOUNTER — Other Ambulatory Visit: Payer: Self-pay

## 2021-06-16 DIAGNOSIS — I739 Peripheral vascular disease, unspecified: Secondary | ICD-10-CM

## 2021-06-26 DIAGNOSIS — E1122 Type 2 diabetes mellitus with diabetic chronic kidney disease: Secondary | ICD-10-CM | POA: Diagnosis not present

## 2021-06-26 DIAGNOSIS — F17200 Nicotine dependence, unspecified, uncomplicated: Secondary | ICD-10-CM | POA: Diagnosis not present

## 2021-06-26 DIAGNOSIS — N184 Chronic kidney disease, stage 4 (severe): Secondary | ICD-10-CM | POA: Diagnosis not present

## 2021-06-26 DIAGNOSIS — I129 Hypertensive chronic kidney disease with stage 1 through stage 4 chronic kidney disease, or unspecified chronic kidney disease: Secondary | ICD-10-CM | POA: Diagnosis not present

## 2021-06-26 DIAGNOSIS — M109 Gout, unspecified: Secondary | ICD-10-CM | POA: Diagnosis not present

## 2021-06-26 DIAGNOSIS — E785 Hyperlipidemia, unspecified: Secondary | ICD-10-CM | POA: Diagnosis not present

## 2021-07-04 DIAGNOSIS — Z0000 Encounter for general adult medical examination without abnormal findings: Secondary | ICD-10-CM | POA: Diagnosis not present

## 2021-07-04 DIAGNOSIS — Z9181 History of falling: Secondary | ICD-10-CM | POA: Diagnosis not present

## 2021-07-04 DIAGNOSIS — Z1331 Encounter for screening for depression: Secondary | ICD-10-CM | POA: Diagnosis not present

## 2021-07-04 DIAGNOSIS — E785 Hyperlipidemia, unspecified: Secondary | ICD-10-CM | POA: Diagnosis not present

## 2021-08-06 DIAGNOSIS — M1990 Unspecified osteoarthritis, unspecified site: Secondary | ICD-10-CM | POA: Diagnosis not present

## 2021-08-06 DIAGNOSIS — Z720 Tobacco use: Secondary | ICD-10-CM | POA: Diagnosis not present

## 2021-08-06 DIAGNOSIS — I10 Essential (primary) hypertension: Secondary | ICD-10-CM | POA: Diagnosis not present

## 2021-08-06 DIAGNOSIS — E785 Hyperlipidemia, unspecified: Secondary | ICD-10-CM | POA: Diagnosis not present

## 2021-08-06 DIAGNOSIS — R413 Other amnesia: Secondary | ICD-10-CM | POA: Diagnosis not present

## 2021-08-06 DIAGNOSIS — Z7409 Other reduced mobility: Secondary | ICD-10-CM | POA: Diagnosis not present

## 2021-08-06 DIAGNOSIS — E1151 Type 2 diabetes mellitus with diabetic peripheral angiopathy without gangrene: Secondary | ICD-10-CM | POA: Diagnosis not present

## 2021-08-06 DIAGNOSIS — M109 Gout, unspecified: Secondary | ICD-10-CM | POA: Diagnosis not present

## 2021-08-06 DIAGNOSIS — H547 Unspecified visual loss: Secondary | ICD-10-CM | POA: Diagnosis not present

## 2021-08-22 DIAGNOSIS — N2581 Secondary hyperparathyroidism of renal origin: Secondary | ICD-10-CM | POA: Diagnosis not present

## 2021-08-22 DIAGNOSIS — R809 Proteinuria, unspecified: Secondary | ICD-10-CM | POA: Diagnosis not present

## 2021-08-22 DIAGNOSIS — D631 Anemia in chronic kidney disease: Secondary | ICD-10-CM | POA: Diagnosis not present

## 2021-08-22 DIAGNOSIS — N184 Chronic kidney disease, stage 4 (severe): Secondary | ICD-10-CM | POA: Diagnosis not present

## 2021-08-22 DIAGNOSIS — Z23 Encounter for immunization: Secondary | ICD-10-CM | POA: Diagnosis not present

## 2021-08-22 DIAGNOSIS — E1122 Type 2 diabetes mellitus with diabetic chronic kidney disease: Secondary | ICD-10-CM | POA: Diagnosis not present

## 2021-08-22 DIAGNOSIS — I129 Hypertensive chronic kidney disease with stage 1 through stage 4 chronic kidney disease, or unspecified chronic kidney disease: Secondary | ICD-10-CM | POA: Diagnosis not present

## 2021-08-22 DIAGNOSIS — N179 Acute kidney failure, unspecified: Secondary | ICD-10-CM | POA: Diagnosis not present

## 2021-08-22 DIAGNOSIS — N189 Chronic kidney disease, unspecified: Secondary | ICD-10-CM | POA: Diagnosis not present

## 2021-08-22 DIAGNOSIS — E785 Hyperlipidemia, unspecified: Secondary | ICD-10-CM | POA: Diagnosis not present

## 2021-09-20 ENCOUNTER — Ambulatory Visit (INDEPENDENT_AMBULATORY_CARE_PROVIDER_SITE_OTHER)
Admission: RE | Admit: 2021-09-20 | Discharge: 2021-09-20 | Disposition: A | Discharge status: Home / Self Care | Payer: Medicare PPO | Source: Ambulatory Visit | Attending: Vascular Surgery | Admitting: Vascular Surgery

## 2021-09-20 ENCOUNTER — Ambulatory Visit (HOSPITAL_COMMUNITY)
Admission: RE | Admit: 2021-09-20 | Discharge: 2021-09-20 | Disposition: A | Discharge status: Home / Self Care | Payer: Medicare PPO | Source: Ambulatory Visit | Attending: Vascular Surgery | Admitting: Vascular Surgery

## 2021-09-20 ENCOUNTER — Encounter: Payer: Self-pay | Admitting: Vascular Surgery

## 2021-09-20 ENCOUNTER — Ambulatory Visit (INDEPENDENT_AMBULATORY_CARE_PROVIDER_SITE_OTHER): Payer: Medicare PPO | Admitting: Vascular Surgery

## 2021-09-20 ENCOUNTER — Other Ambulatory Visit: Payer: Self-pay

## 2021-09-20 VITALS — BP 157/87 | HR 84 | Temp 98.2°F | Resp 20 | Ht 70.0 in | Wt 183.0 lb

## 2021-09-20 DIAGNOSIS — I739 Peripheral vascular disease, unspecified: Secondary | ICD-10-CM | POA: Diagnosis not present

## 2021-09-20 NOTE — Progress Notes (Signed)
Patient ID: Joseph Hebert., male   DOB: January 09, 1946, 75 y.o.   MRN: [id number]  Reason for Consult: Follow-up   Referred by [name], MD  Subjective:     HPI:  Joseph Hebert. is a 75 y.o. male history of redo left femoral to above-knee popliteal artery bypass with graft.  He also has a remote history of bilateral common iliac artery stenting in 1999 at the [facility] hospital.  At last visit he was noted to have significant narrowing but due to renal insufficiency issues he elected for no further evaluation.  He is now back with bypass duplex and ABIs.  Patient does have issues with his back has previously undergone anterior and posterior fusion.  For these reasons he does ride a scooter but he is able to walk with minimal limitation.  He continues to smoke without plans for quitting.  He does have numbness to his anterior right knee and shin after previous bypass.  He also has swelling of his left lower extremity since last bypass.  He denies fevers or chills.  He has no tissue loss or ulceration.  Past Medical History:  Diagnosis Date   Acute encephalopathy    Acute hypoxemic respiratory failure (HCC)    Acute pulmonary edema (HCC)    Acute respiratory failure with hypoxia [facility])    Aftercare following surgery of the circulatory system, NEC 01/01/2014   Anemia    Arrhythmia    takes metoprolol daily   Arthritis    Asthma    Bilateral leg pain 06/16/2015   CAD (coronary artery disease)    Cardiac asystole (HCC)    Cardiogenic shock (HCC)    Carotid artery occlusion    with Claudication   Chronic back pain    stenosis   Chronic kidney disease    CKD (chronic kidney disease) stage 3, GFR 30-59 ml/min ([place]) 07/18/2015   COPD (chronic obstructive pulmonary disease) (HCC)    Spirva daily and Albuterol as needed   Dementia ([place])    Depression    Diabetes mellitus without complication (HCC)    Type 2 diet controlled.Never been on meds   GERD (gastroesophageal reflux disease)     Gout    takes Uloric daily   HCAP (healthcare-associated pneumonia)    Head injury    as a child   History of colon polyps    benign   History of gastric ulcer    History of hiatal hernia    Hyperlipidemia    takes Atorvastatin and Fenofibrate daily   Hypertension    takes Lisinopril,Amlodipine,and Hydralazine daily   Impaired memory    takes Namenda daily   Intermittent claudication ([place]) 07/18/2015   Overview:  Operative management: The patient will be scheduled for a left femoral to  popliteal bypass in the near future after cardiac risk stratification. He  does have some superficial femoral and tibial artery occlusive disease in  the right leg. However his ABI on the right side is greater than 0.9.  Hopefully he will improve with just a walking program the right leg alone.  If not we could c   Lumbosacral spondylosis with radiculopathy 02/17/2016   Nevus of choroid of left eye 12/08/2014   Numbness    lower left leg and upper right thigh   Occlusion and stenosis of carotid artery without mention of cerebral infarction 01/01/2013   PAD (peripheral artery disease) (HCC)    Pneumonia    hx of-couple of yrs  ago   Preoperative cardiovascular examination 07/17/2015   Overview:  Echo 2013 with EF 55-60% Lexiscan MPS 11/12/12 with normal perfusion and function  Overview:  Echo 2013 with EF 55-60% Lexiscan MPS 11/12/12 with normal perfusion and function   Serous retinal detachment of left eye 12/08/2014   Shortness of breath dyspnea    with exertion   Status post intraocular lens implant 12/08/2014   Tuberculosis 2006    9 months    Vertigo    Wears glasses    Family History  Problem Relation Age of Onset   Diabetes Mother    Hyperlipidemia Father    Hypertension Father    Other Father        Right Leg Amputation   Heart disease Sister        Aneyrism    Past Surgical History:  Procedure Laterality Date   ABDOMINAL AORTOGRAM W/LOWER EXTREMITY N/A 02/20/2019   Procedure:  ABDOMINAL AORTOGRAM W/LOWER EXTREMITY;  Surgeon: [name], MD;  Location: [place] [facility];  Service: Cardiovascular;  Laterality: N/A;   ABDOMINAL EXPOSURE N/A 01/22/2017   Procedure: ABDOMINAL EXPOSURE;  Surgeon: [name], MD;  Location: [place];  Service: Vascular;  Laterality: N/A;   ANTERIOR LAT LUMBAR FUSION N/A 01/22/2017   Procedure: LUMBAR THREE-FOUR, LUMBAR FOUR-FIVE ANTERIOR LATERAL INTERBODY FUSION;  Surgeon: [name], MD;  Location: [place];  Service: Neurosurgery;  Laterality: N/A;  L3-4 L4-5 Lateral interbody fusion   ANTERIOR LUMBAR FUSION N/A 01/22/2017   Procedure: LUMBAR FIVE-SACRUM ONE ANTERIOR LUMBAR INTERBODY FUSION WITH ABDOMINAL APPROACH BY DR [name];  Surgeon: [name], MD;  Location: [place];  Service: Neurosurgery;  Laterality: N/A;   CAROTID BODY TUMOR EXCISION Left 09/21/2015   Procedure: EXCISE LEFT NECK NODULE WITH LOCAL ;  Surgeon: [name], MD;  Location: [place];  Service: Vascular;  Laterality: Left;   CAROTID ENDARTERECTOMY  Nov. 15,2011   LEFT cea   cataract surgery Bilateral    COLONOSCOPY     EYE SURGERY     FEMORAL-POPLITEAL BYPASS GRAFT Left 08/23/2015   Procedure: LEFT FEMORAL-POPLITEAL ARTERY BYPASS WITH GORETEX GRAFT;  Surgeon: [name], MD;  Location: [place];  Service: Vascular;  Laterality: Left;   FEMORAL-POPLITEAL BYPASS GRAFT Left 02/23/2019   Procedure: REDO LEFT FEMORAL TO POPLITEAL ARTERY BYPASS GRAFT Using Propaten graft;  Surgeon: [name], MD;  Location: [place];  Service: Vascular;  Laterality: Left;   JOINT REPLACEMENT  1980   RIGHT  knee   LUMBAR EPIDURAL INJECTION     LUMBAR LAMINECTOMY/DECOMPRESSION MICRODISCECTOMY Right 02/17/2016   Procedure: Laminectomy and Foraminotomy - Lumbar four-five right;  Surgeon: [name], MD;  [facility];  Service: Neurosurgery;  Laterality: Right;  right   LUMBAR PERCUTANEOUS PEDICLE SCREW 3 LEVEL N/A 01/22/2017   Procedure: LUMBAR  THREE-SACRAL ONE PERCUTANEOUS PEDICLE SCREW FIXATION WITH ROBOTIC ASSISTANCE;  Surgeon: [name], MD;  Location: [place];  Service: Neurosurgery;  Laterality: N/A;  L3 to S1 Percutaneous pedicle screw fixation   PERIPHERAL VASCULAR CATHETERIZATION N/A 06/24/2015   Procedure: Abdominal Aortogram;  Surgeon: [name], MD;  Location: [place] [facility];  Service: Cardiovascular;  Laterality: N/A;   Stents  Aug.  23, 1999   Bilateral iliofemoral  stents, [place].   TONSILLECTOMY      Short Social History:  Social History   Tobacco Use   Smoking status: Every Day    Packs/day: 1.00  Types: Cigarettes   Smokeless tobacco: Never  Substance Use Topics   Alcohol use: No    Alcohol/week: 0.0 standard drinks    Allergies  Allergen Reactions   Lopressor [Metoprolol Tartrate] Other (See Comments)    Severe bradycardia    Current Outpatient Medications  Medication Sig Dispense Refill   amLODipine (NORVASC) 10 MG tablet Take 10 mg by mouth daily at 10 pm. (2100)     atorvastatin (LIPITOR) 80 MG tablet Take 80 mg by mouth daily at 10 pm. (2100)     losartan (COZAAR) 100 MG tablet Take 100 mg by mouth daily.      triamcinolone ointment (KENALOG) 0.5 %      ULORIC 80 MG TABS Take 80 mg by mouth daily at 10 pm. (2100)  5   No current facility-administered medications for this visit.    Review of Systems  Constitutional:  Constitutional negative. HENT: HENT negative.  Eyes: Eyes negative.  Respiratory: Respiratory negative.  Cardiovascular: Positive for leg swelling.  GI: Gastrointestinal negative.  Musculoskeletal: Positive for leg pain.  Skin: Skin negative.  Neurological: Neurological negative. Hematologic: Hematologic/lymphatic negative.  Psychiatric: Psychiatric negative.       Objective:  Objective   Vitals:   09/20/21 1436  BP: (!) 157/87  Pulse: 84  Resp: 20  Temp: 98.2 F (36.8 C)  SpO2: 96%    Physical Exam HENT:     Head: Normocephalic.      Nose:     Comments: Wearing a mask Eyes:     Pupils: Pupils are equal, round, and reactive to light.  Cardiovascular:     Pulses:          Femoral pulses are 2+ on the right side and 2+ on the left side.      Popliteal pulses are 2+ on the left side.       Dorsalis pedis pulses are 0 on the right side and 0 on the left side.       Posterior tibial pulses are 0 on the right side and 0 on the left side.  Abdominal:     General: Abdomen is flat.     Palpations: Abdomen is soft.  Musculoskeletal:     Right lower leg: No edema.     Left lower leg: Edema present.  Skin:    General: Skin is warm and dry.     Capillary Refill: Capillary refill takes less than 2 seconds.  Neurological:     General: No focal deficit present.     Mental Status: He is alert.    Data: ABI Findings:  +---------+------------------+-----+----------+--------+  Right    Rt Pressure (mmHg)IndexWaveform  Comment   +---------+------------------+-----+----------+--------+  Brachial 177                                        +---------+------------------+-----+----------+--------+  PTA      95                0.52 monophasic          +---------+------------------+-----+----------+--------+  DP       91                0.50 monophasic          +---------+------------------+-----+----------+--------+  Great Toe74                0.41                     +---------+------------------+-----+----------+--------+   +---------+------------------+-----+--------+-------+  Left     Lt Pressure (mmHg)IndexWaveformComment  +---------+------------------+-----+--------+-------+  Brachial 181                                     +---------+------------------+-----+--------+-------+  PTA      125               0.69 biphasic         +---------+------------------+-----+--------+-------+  DP       105               0.58 biphasic          +---------+------------------+-----+--------+-------+  Great Toe100               0.55                  +---------+------------------+-----+--------+-------+   +-------+-----------+-----------+------------+------------+  ABI/TBIToday's ABIToday's TBIPrevious ABIPrevious TBI  +-------+-----------+-----------+------------+------------+  Right  0.52       0.41       0.51        0.27          +-------+-----------+-----------+------------+------------+  Left   0.69       0.55       0.70        0.56          +-------+-----------+-----------+------------+------------+      Left Graft #1: femoropopliteal  +--------------------+--------+--------+----------+---------------+                      PSV cm/sStenosisWaveform  Comments         +--------------------+--------+--------+----------+---------------+  Inflow              137             biphasic                   +--------------------+--------+--------+----------+---------------+  Proximal Anastomosis142             monophasic                 +--------------------+--------+--------+----------+---------------+  Proximal Graft      88              monophasic                 +--------------------+--------+--------+----------+---------------+  Mid Graft           62              monophasic                 +--------------------+--------+--------+----------+---------------+  Distal Graft        61              monophasicPerigraft fluid  +--------------------+--------+--------+----------+---------------+  Distal Anastomosis  101             monophasicPerigraft fluid  +--------------------+--------+--------+----------+---------------+  Outflow             69              monophasic                 +--------------------+--------+--------+----------+---------------+   337 cm/s distal EIA        Summary:  Left: Widely patent femoropopliteal graft without evidence of   hemodynamically significant stenosis.  Distal external iliac artery disease (>50%).  Continued perigraft fluid in the distal thigh.      Assessment/Plan:     75 year old male with  history of recurrent left femoral to above-knee popliteal artery bypass.  He also has previous wallstents placed in 1999 and his bilateral common iliac arteries.  He does have chronic renal insufficiency making him high risk for any endovascular intervention.  His previous wallstents also significantly raise the aortic bifurcation making any endovascular intervention very difficult.  By duplex today he does have a distal external iliac artery stenosis with no bypass graft stenosis or decreased velocities distally in the graft to suggest impending failure.  For the above reasons I have recommended continued surveillance of the graft.  Certainly if he has worsened issues we can see him as needed but otherwise we will see him in 1 year with aortoiliac duplex to evaluate his previously placed wall stents as well as left lower extremity bypass graft duplex.     [name] MD [facility]

## 2021-09-27 DIAGNOSIS — N184 Chronic kidney disease, stage 4 (severe): Secondary | ICD-10-CM | POA: Diagnosis not present

## 2021-09-27 DIAGNOSIS — J3489 Other specified disorders of nose and nasal sinuses: Secondary | ICD-10-CM | POA: Diagnosis not present

## 2021-09-27 DIAGNOSIS — I129 Hypertensive chronic kidney disease with stage 1 through stage 4 chronic kidney disease, or unspecified chronic kidney disease: Secondary | ICD-10-CM | POA: Diagnosis not present

## 2021-09-27 DIAGNOSIS — E1122 Type 2 diabetes mellitus with diabetic chronic kidney disease: Secondary | ICD-10-CM | POA: Diagnosis not present

## 2021-09-27 DIAGNOSIS — M109 Gout, unspecified: Secondary | ICD-10-CM | POA: Diagnosis not present

## 2021-09-27 DIAGNOSIS — E785 Hyperlipidemia, unspecified: Secondary | ICD-10-CM | POA: Diagnosis not present

## 2021-10-23 DIAGNOSIS — L578 Other skin changes due to chronic exposure to nonionizing radiation: Secondary | ICD-10-CM | POA: Diagnosis not present

## 2021-10-23 DIAGNOSIS — D485 Neoplasm of uncertain behavior of skin: Secondary | ICD-10-CM | POA: Diagnosis not present

## 2021-10-23 DIAGNOSIS — L300 Nummular dermatitis: Secondary | ICD-10-CM | POA: Diagnosis not present

## 2021-11-22 DIAGNOSIS — I739 Peripheral vascular disease, unspecified: Secondary | ICD-10-CM | POA: Diagnosis not present

## 2021-11-22 DIAGNOSIS — D631 Anemia in chronic kidney disease: Secondary | ICD-10-CM | POA: Diagnosis not present

## 2021-11-22 DIAGNOSIS — R809 Proteinuria, unspecified: Secondary | ICD-10-CM | POA: Diagnosis not present

## 2021-11-22 DIAGNOSIS — N179 Acute kidney failure, unspecified: Secondary | ICD-10-CM | POA: Diagnosis not present

## 2021-11-22 DIAGNOSIS — I129 Hypertensive chronic kidney disease with stage 1 through stage 4 chronic kidney disease, or unspecified chronic kidney disease: Secondary | ICD-10-CM | POA: Diagnosis not present

## 2021-11-22 DIAGNOSIS — N189 Chronic kidney disease, unspecified: Secondary | ICD-10-CM | POA: Diagnosis not present

## 2021-11-22 DIAGNOSIS — E1122 Type 2 diabetes mellitus with diabetic chronic kidney disease: Secondary | ICD-10-CM | POA: Diagnosis not present

## 2021-11-22 DIAGNOSIS — N184 Chronic kidney disease, stage 4 (severe): Secondary | ICD-10-CM | POA: Diagnosis not present

## 2021-11-22 DIAGNOSIS — E785 Hyperlipidemia, unspecified: Secondary | ICD-10-CM | POA: Diagnosis not present

## 2021-11-22 DIAGNOSIS — N2581 Secondary hyperparathyroidism of renal origin: Secondary | ICD-10-CM | POA: Diagnosis not present

## 2021-12-28 DIAGNOSIS — I129 Hypertensive chronic kidney disease with stage 1 through stage 4 chronic kidney disease, or unspecified chronic kidney disease: Secondary | ICD-10-CM | POA: Diagnosis not present

## 2021-12-28 DIAGNOSIS — E785 Hyperlipidemia, unspecified: Secondary | ICD-10-CM | POA: Diagnosis not present

## 2021-12-28 DIAGNOSIS — N184 Chronic kidney disease, stage 4 (severe): Secondary | ICD-10-CM | POA: Diagnosis not present

## 2021-12-28 DIAGNOSIS — M109 Gout, unspecified: Secondary | ICD-10-CM | POA: Diagnosis not present

## 2021-12-28 DIAGNOSIS — E1122 Type 2 diabetes mellitus with diabetic chronic kidney disease: Secondary | ICD-10-CM | POA: Diagnosis not present

## 2022-02-22 DIAGNOSIS — E1122 Type 2 diabetes mellitus with diabetic chronic kidney disease: Secondary | ICD-10-CM | POA: Diagnosis not present

## 2022-02-22 DIAGNOSIS — I739 Peripheral vascular disease, unspecified: Secondary | ICD-10-CM | POA: Diagnosis not present

## 2022-02-22 DIAGNOSIS — N2581 Secondary hyperparathyroidism of renal origin: Secondary | ICD-10-CM | POA: Diagnosis not present

## 2022-02-22 DIAGNOSIS — D631 Anemia in chronic kidney disease: Secondary | ICD-10-CM | POA: Diagnosis not present

## 2022-02-22 DIAGNOSIS — N189 Chronic kidney disease, unspecified: Secondary | ICD-10-CM | POA: Diagnosis not present

## 2022-02-22 DIAGNOSIS — I129 Hypertensive chronic kidney disease with stage 1 through stage 4 chronic kidney disease, or unspecified chronic kidney disease: Secondary | ICD-10-CM | POA: Diagnosis not present

## 2022-02-22 DIAGNOSIS — R809 Proteinuria, unspecified: Secondary | ICD-10-CM | POA: Diagnosis not present

## 2022-02-22 DIAGNOSIS — E785 Hyperlipidemia, unspecified: Secondary | ICD-10-CM | POA: Diagnosis not present

## 2022-02-22 DIAGNOSIS — N179 Acute kidney failure, unspecified: Secondary | ICD-10-CM | POA: Diagnosis not present

## 2022-02-22 DIAGNOSIS — N184 Chronic kidney disease, stage 4 (severe): Secondary | ICD-10-CM | POA: Diagnosis not present

## 2022-03-29 DIAGNOSIS — E785 Hyperlipidemia, unspecified: Secondary | ICD-10-CM | POA: Diagnosis not present

## 2022-03-29 DIAGNOSIS — M109 Gout, unspecified: Secondary | ICD-10-CM | POA: Diagnosis not present

## 2022-03-29 DIAGNOSIS — I129 Hypertensive chronic kidney disease with stage 1 through stage 4 chronic kidney disease, or unspecified chronic kidney disease: Secondary | ICD-10-CM | POA: Diagnosis not present

## 2022-03-29 DIAGNOSIS — E1122 Type 2 diabetes mellitus with diabetic chronic kidney disease: Secondary | ICD-10-CM | POA: Diagnosis not present

## 2022-03-29 DIAGNOSIS — N184 Chronic kidney disease, stage 4 (severe): Secondary | ICD-10-CM | POA: Diagnosis not present

## 2022-03-29 DIAGNOSIS — Z125 Encounter for screening for malignant neoplasm of prostate: Secondary | ICD-10-CM | POA: Diagnosis not present

## 2022-06-28 ENCOUNTER — Telehealth: Payer: Self-pay

## 2022-06-28 DIAGNOSIS — I6521 Occlusion and stenosis of right carotid artery: Secondary | ICD-10-CM

## 2022-06-28 NOTE — Telephone Encounter (Signed)
Pt called stating that he went to the eye doctor yesterday and they said they "saw cholesterol specks in his R eye" and were concerned that his R carotid might be blocked.  Spoke with Sam PA, reviewed pt's chart, returned pt's call for clarification, two identifiers used. Informed pt that he needed an [place] and asked his availability.  Called pt after making appts to inform him of date and times. Confirmed understanding.

## 2022-06-29 DIAGNOSIS — N184 Chronic kidney disease, stage 4 (severe): Secondary | ICD-10-CM | POA: Diagnosis not present

## 2022-06-29 DIAGNOSIS — I129 Hypertensive chronic kidney disease with stage 1 through stage 4 chronic kidney disease, or unspecified chronic kidney disease: Secondary | ICD-10-CM | POA: Diagnosis not present

## 2022-06-29 DIAGNOSIS — E1122 Type 2 diabetes mellitus with diabetic chronic kidney disease: Secondary | ICD-10-CM | POA: Diagnosis not present

## 2022-06-29 DIAGNOSIS — M109 Gout, unspecified: Secondary | ICD-10-CM | POA: Diagnosis not present

## 2022-06-29 DIAGNOSIS — E785 Hyperlipidemia, unspecified: Secondary | ICD-10-CM | POA: Diagnosis not present

## 2022-07-04 ENCOUNTER — Encounter: Payer: Self-pay | Admitting: Vascular Surgery

## 2022-07-04 ENCOUNTER — Ambulatory Visit (INDEPENDENT_AMBULATORY_CARE_PROVIDER_SITE_OTHER): Payer: Medicare PPO | Admitting: Vascular Surgery

## 2022-07-04 ENCOUNTER — Ambulatory Visit (HOSPITAL_COMMUNITY)
Admission: RE | Admit: 2022-07-04 | Discharge: 2022-07-04 | Disposition: A | Discharge status: Home / Self Care | Payer: Medicare PPO | Source: Ambulatory Visit | Attending: Vascular Surgery | Admitting: Vascular Surgery

## 2022-07-04 VITALS — BP 154/82 | HR 103 | Temp 98.0°F | Resp 20 | Ht 70.0 in | Wt 183.0 lb

## 2022-07-04 DIAGNOSIS — I6521 Occlusion and stenosis of right carotid artery: Secondary | ICD-10-CM | POA: Insufficient documentation

## 2022-07-04 DIAGNOSIS — H34211 Partial retinal artery occlusion, right eye: Secondary | ICD-10-CM

## 2022-07-04 NOTE — Progress Notes (Signed)
Patient ID: Joseph Hebert., male   DOB: 1946-02-13, 76 y.o.   MRN: [id number]  Reason for Consult: Follow-up   Referred by [name], MD  Subjective:     HPI:  Joseph Hebert. is a 76 y.o. male with extensive history of left lower extremity bypass surgery and also bilateral common iliac artery stenting and also left carotid endarterectomy performed by Dr. [name].  I also performed an exposure for ALIF 5 years ago.  His chief complaint now is left lower extremity swelling after his most bypass and also he has severe back pain which limit his walking in both of his lower extremities he does use a cart to help him get around as well as walks with a cane short distances.  He does not have any tissue loss or ulceration.  Recently was found to have calcium buildup in his right eye.  Denies any history of stroke, TIA or amaurosis.  He does take an aspirin 81 mg daily as well as a statin.  Past Medical History:  Diagnosis Date   Acute encephalopathy    Acute hypoxemic respiratory failure (HCC)    Acute pulmonary edema (HCC)    Acute respiratory failure with hypoxia [facility])    Aftercare following surgery of the circulatory system, NEC 01/01/2014   Anemia    Arrhythmia    takes metoprolol daily   Arthritis    Asthma    Bilateral leg pain 06/16/2015   CAD (coronary artery disease)    Cardiac asystole (HCC)    Cardiogenic shock (HCC)    Carotid artery occlusion    with Claudication   Chronic back pain    stenosis   Chronic kidney disease    CKD (chronic kidney disease) stage 3, GFR 30-59 ml/min ([name]) 07/18/2015   COPD (chronic obstructive pulmonary disease) (HCC)    Spirva daily and Albuterol as needed   Dementia ([place])    Depression    Diabetes mellitus without complication (HCC)    Type 2 diet controlled.Never been on meds   GERD (gastroesophageal reflux disease)    Gout    takes Uloric daily   HCAP (healthcare-associated pneumonia)    Head injury    as a child    History of colon polyps    benign   History of gastric ulcer    History of hiatal hernia    Hyperlipidemia    takes Atorvastatin and Fenofibrate daily   Hypertension    takes Lisinopril,Amlodipine,and Hydralazine daily   Impaired memory    takes Namenda daily   Intermittent claudication ([name]) 07/18/2015   Overview:  Operative management: The patient will be scheduled for a left femoral to  popliteal bypass in the near future after cardiac risk stratification. He  does have some superficial femoral and tibial artery occlusive disease in  the right leg. However his ABI on the right side is greater than 0.9.  Hopefully he will improve with just a walking program the right leg alone.  If not we could c   Lumbosacral spondylosis with radiculopathy 02/17/2016   Nevus of choroid of left eye 12/08/2014   Numbness    lower left leg and upper right thigh   Occlusion and stenosis of carotid artery without mention of cerebral infarction 01/01/2013   PAD (peripheral artery disease) ([place])    Pneumonia    hx of-couple of yrs ago   Preoperative cardiovascular examination 07/17/2015   Overview:  Echo 2013 with EF 55-60% Lexiscan  MPS 11/12/12 with normal perfusion and function  Overview:  Echo 2013 with EF 55-60% Lexiscan MPS 11/12/12 with normal perfusion and function   Serous retinal detachment of left eye 12/08/2014   Shortness of breath dyspnea    with exertion   Status post intraocular lens implant 12/08/2014   Tuberculosis 2006    9 months    Vertigo    Wears glasses    Family History  Problem Relation Age of Onset   Diabetes Mother    Hyperlipidemia Father    Hypertension Father    Other Father        Right Leg Amputation   Heart disease Sister        Aneyrism    Past Surgical History:  Procedure Laterality Date   ABDOMINAL AORTOGRAM W/LOWER EXTREMITY N/A 02/20/2019   Procedure: ABDOMINAL AORTOGRAM W/LOWER EXTREMITY;  Surgeon: [name], MD;  Location: [place] [facility];  Service:  Cardiovascular;  Laterality: N/A;   ABDOMINAL EXPOSURE N/A 01/22/2017   Procedure: ABDOMINAL EXPOSURE;  Surgeon: [name], MD;  Location: [place];  Service: Vascular;  Laterality: N/A;   ANTERIOR LAT LUMBAR FUSION N/A 01/22/2017   Procedure: LUMBAR THREE-FOUR, LUMBAR FOUR-FIVE ANTERIOR LATERAL INTERBODY FUSION;  Surgeon: [name], MD;  Location: [place];  Service: Neurosurgery;  Laterality: N/A;  L3-4 L4-5 Lateral interbody fusion   ANTERIOR LUMBAR FUSION N/A 01/22/2017   Procedure: LUMBAR FIVE-SACRUM ONE ANTERIOR LUMBAR INTERBODY FUSION WITH ABDOMINAL APPROACH BY DR [name];  Surgeon: [name], MD;  Location: [place];  Service: Neurosurgery;  Laterality: N/A;   CAROTID BODY TUMOR EXCISION Left 09/21/2015   Procedure: EXCISE LEFT NECK NODULE WITH LOCAL ;  Surgeon: [name], MD;  Location: [place];  Service: Vascular;  Laterality: Left;   CAROTID ENDARTERECTOMY  Nov. 15,2011   LEFT cea   cataract surgery Bilateral    COLONOSCOPY     EYE SURGERY     FEMORAL-POPLITEAL BYPASS GRAFT Left 08/23/2015   Procedure: LEFT FEMORAL-POPLITEAL ARTERY BYPASS WITH GORETEX GRAFT;  Surgeon: [name], MD;  Location: [place];  Service: Vascular;  Laterality: Left;   FEMORAL-POPLITEAL BYPASS GRAFT Left 02/23/2019   Procedure: REDO LEFT FEMORAL TO POPLITEAL ARTERY BYPASS GRAFT Using Propaten graft;  Surgeon: [name], MD;  Location: [place];  Service: Vascular;  Laterality: Left;   JOINT REPLACEMENT  1980   RIGHT  knee   LUMBAR EPIDURAL INJECTION     LUMBAR LAMINECTOMY/DECOMPRESSION MICRODISCECTOMY Right 02/17/2016   Procedure: Laminectomy and Foraminotomy - Lumbar four-five right;  Surgeon: [name], MD;  Location: [facility];  Service: Neurosurgery;  Laterality: Right;  right   LUMBAR PERCUTANEOUS PEDICLE SCREW 3 LEVEL N/A 01/22/2017   Procedure: LUMBAR THREE-SACRAL ONE PERCUTANEOUS PEDICLE SCREW FIXATION WITH ROBOTIC ASSISTANCE;  Surgeon: [name], MD;   Location: [place];  Service: Neurosurgery;  Laterality: N/A;  L3 to S1 Percutaneous pedicle screw fixation   PERIPHERAL VASCULAR CATHETERIZATION N/A 06/24/2015   Procedure: Abdominal Aortogram;  Surgeon: [name], MD;  Location: [place] [facility];  Service: Cardiovascular;  Laterality: N/A;   Stents  Aug.  23, 1999   Bilateral iliofemoral  stents, [place].   TONSILLECTOMY      Short Social History:  Social History   Tobacco Use   Smoking status: Every Day    Packs/day: 1.00    Types: Cigarettes   Smokeless tobacco: Never  Substance Use Topics   Alcohol use:  No    Alcohol/week: 0.0 standard drinks of alcohol    Allergies  Allergen Reactions   Lopressor [Metoprolol Tartrate] Other (See Comments)    Severe bradycardia    Current Outpatient Medications  Medication Sig Dispense Refill   amLODipine (NORVASC) 10 MG tablet Take 10 mg by mouth daily at 10 pm. (2100)     atorvastatin (LIPITOR) 80 MG tablet Take 80 mg by mouth daily at 10 pm. (2100)     hydrALAZINE (APRESOLINE) 25 MG tablet Take 1 tablet by mouth 2 (two) times daily.     losartan (COZAAR) 100 MG tablet Take 100 mg by mouth daily.      memantine (NAMENDA) 10 MG tablet Take 1 tablet by mouth 2 (two) times daily.     triamcinolone ointment (KENALOG) 0.5 %      ULORIC 80 MG TABS Take 80 mg by mouth daily at 10 pm. (2100)  5   No current facility-administered medications for this visit.    Review of Systems  Constitutional:  Constitutional negative. Eyes: Eyes negative.  Cardiovascular: Positive for leg swelling.  GI: Gastrointestinal negative.  Musculoskeletal: Positive for back pain, gait problem, leg pain and joint pain.  Hematologic: Hematologic/lymphatic negative.  Psychiatric: Psychiatric negative.        Objective:  Objective   Vitals:   07/04/22 1131  BP: (!) 154/82  Pulse: (!) 103  Resp: 20  Temp: 98 F (36.7 C)  SpO2: 98%  Weight: 183 lb (83 kg)  Height: '5\' 10"'$  (1.778 m)   Body mass  index is 26.26 kg/m.  Physical Exam HENT:     Head: Normocephalic.  Neck:     Vascular: No carotid bruit.  Cardiovascular:     Rate and Rhythm: Normal rate.     Pulses:          Femoral pulses are 2+ on the right side and 2+ on the left side.    Heart sounds: Murmur heard.     Systolic murmur is present with a grade of 3/6.  Pulmonary:     Breath sounds: Normal breath sounds.  Abdominal:     General: Abdomen is flat.     Palpations: Abdomen is soft.  Musculoskeletal:     Right lower leg: No edema.     Left lower leg: Edema present.  Skin:    General: Skin is warm and dry.     Capillary Refill: Capillary refill takes less than 2 seconds.  Neurological:     General: No focal deficit present.     Mental Status: He is alert.  Psychiatric:        Mood and Affect: Mood normal.     Data: Right Carotid Findings:  +----------+--------+--------+--------+--------------------------+---------  +            PSV cm/sEDV cm/sStenosisPlaque Description        Comments    +----------+--------+--------+--------+--------------------------+---------  +  CCA Prox  80      14                                                    +----------+--------+--------+--------+--------------------------+---------  +  CCA Mid   138     21              heterogenous and irregular            +----------+--------+--------+--------+--------------------------+---------  +  CCA Distal110     21              calcific                    Shadowing  +----------+--------+--------+--------+--------------------------+---------  +  ICA Prox  284     75      60-79%  heterogenous                          +----------+--------+--------+--------+--------------------------+---------  +  ICA Mid   270     64                                                    +----------+--------+--------+--------+--------------------------+---------  +  ICA Distal147     30                                                     +----------+--------+--------+--------+--------------------------+---------  +  ECA       239     12              heterogenous and irregular            +----------+--------+--------+--------+--------------------------+---------  +   +----------+--------+-------+--------+-------------------+            PSV cm/sEDV cmsDescribeArm Pressure (mmHG)  +----------+--------+-------+--------+-------------------+  [id number]            Stenotic140                  +----------+--------+-------+--------+-------------------+   +---------+--------+--------+---------------+  VertebralPSV cm/sEDV cm/sBi- directional  +---------+--------+--------+---------------+       Left Carotid Findings:  +----------+--------+--------+--------+------------------+--------+            PSV cm/sEDV cm/sStenosisPlaque DescriptionComments  +----------+--------+--------+--------+------------------+--------+  CCA Prox  103     24                                          +----------+--------+--------+--------+------------------+--------+  CCA Mid   104     24              heterogenous                +----------+--------+--------+--------+------------------+--------+  CCA Distal118     20              heterogenous                +----------+--------+--------+--------+------------------+--------+  ICA Prox  86      13      1-39%   heterogenous                +----------+--------+--------+--------+------------------+--------+  ICA Mid   68      12                                          +----------+--------+--------+--------+------------------+--------+  ICA Distal88      21                                          +----------+--------+--------+--------+------------------+--------+  ECA       449     42      >50%    heterogenous                 +----------+--------+--------+--------+------------------+--------+   +----------+--------+--------+----------------+-------------------+            PSV cm/sEDV cm/sDescribe        Arm Pressure (mmHG)  +----------+--------+--------+----------------+-------------------+  [id number]              Multiphasic, [id number]                  +----------+--------+--------+----------------+-------------------+   +---------+--------+--+--------+--+---------+  VertebralPSV cm/s73EDV cm/s17Antegrade  +---------+--------+--+--------+--+---------+           Summary:  Right Carotid: Velocities in the right ICA are consistent with a 60-79%                 stenosis.   Left Carotid: Velocities in the left ICA are consistent with a 1-39%  stenosis.   Vertebrals:  Left vertebral artery demonstrates antegrade flow. Right  vertebral               artery demonstrates bidirectional flow. Brachial pressure               difference noted.  Subclavians: Right subclavian artery was stenotic. Normal flow  hemodynamics were               seen in the left subclavian artery.      Assessment/Plan:    76 year old male with history of left carotid endarterectomy left lower extremity bypass on 2 separate occasions with residual swelling and limited walking mostly from back issues.  Now found to have calcium buildup in his right eye with concern for embolic disease from his carotid.  Given low grade carotid stenosis unlikely the source and would not recommend any intervention at this time.  I did discuss we could possibly obtain CT but given his elevated creatinine and low risk of the carotid involvement we will just follow him up in 1 year.  He does have follow-up in October to evaluate his bypass which is already scheduled.  We discussed the signs and symptoms of stroke which he demonstrates good understanding.     [name] MD [facility]

## 2022-07-12 DIAGNOSIS — Z0000 Encounter for general adult medical examination without abnormal findings: Secondary | ICD-10-CM | POA: Diagnosis not present

## 2022-07-12 DIAGNOSIS — E785 Hyperlipidemia, unspecified: Secondary | ICD-10-CM | POA: Diagnosis not present

## 2022-07-12 DIAGNOSIS — Z9181 History of falling: Secondary | ICD-10-CM | POA: Diagnosis not present

## 2022-07-12 DIAGNOSIS — Z1331 Encounter for screening for depression: Secondary | ICD-10-CM | POA: Diagnosis not present

## 2022-07-23 DIAGNOSIS — N529 Male erectile dysfunction, unspecified: Secondary | ICD-10-CM | POA: Diagnosis not present

## 2022-07-23 DIAGNOSIS — F0150 Vascular dementia without behavioral disturbance: Secondary | ICD-10-CM | POA: Diagnosis not present

## 2022-07-23 DIAGNOSIS — I129 Hypertensive chronic kidney disease with stage 1 through stage 4 chronic kidney disease, or unspecified chronic kidney disease: Secondary | ICD-10-CM | POA: Diagnosis not present

## 2022-07-23 DIAGNOSIS — Z7982 Long term (current) use of aspirin: Secondary | ICD-10-CM | POA: Diagnosis not present

## 2022-07-23 DIAGNOSIS — E785 Hyperlipidemia, unspecified: Secondary | ICD-10-CM | POA: Diagnosis not present

## 2022-07-23 DIAGNOSIS — E1122 Type 2 diabetes mellitus with diabetic chronic kidney disease: Secondary | ICD-10-CM | POA: Diagnosis not present

## 2022-07-23 DIAGNOSIS — E1151 Type 2 diabetes mellitus with diabetic peripheral angiopathy without gangrene: Secondary | ICD-10-CM | POA: Diagnosis not present

## 2022-07-23 DIAGNOSIS — N184 Chronic kidney disease, stage 4 (severe): Secondary | ICD-10-CM | POA: Diagnosis not present

## 2022-07-23 DIAGNOSIS — M1990 Unspecified osteoarthritis, unspecified site: Secondary | ICD-10-CM | POA: Diagnosis not present

## 2022-09-21 ENCOUNTER — Other Ambulatory Visit: Payer: Self-pay | Admitting: *Deleted

## 2022-09-21 DIAGNOSIS — I739 Peripheral vascular disease, unspecified: Secondary | ICD-10-CM

## 2022-09-21 DIAGNOSIS — M79605 Pain in left leg: Secondary | ICD-10-CM

## 2022-09-26 ENCOUNTER — Ambulatory Visit (HOSPITAL_COMMUNITY)
Admission: RE | Admit: 2022-09-26 | Discharge: 2022-09-26 | Disposition: A | Discharge status: Home / Self Care | Payer: Medicare PPO | Source: Ambulatory Visit | Attending: Vascular Surgery | Admitting: Vascular Surgery

## 2022-09-26 ENCOUNTER — Ambulatory Visit (INDEPENDENT_AMBULATORY_CARE_PROVIDER_SITE_OTHER): Payer: Medicare PPO | Admitting: Vascular Surgery

## 2022-09-26 ENCOUNTER — Ambulatory Visit (INDEPENDENT_AMBULATORY_CARE_PROVIDER_SITE_OTHER)
Admission: RE | Admit: 2022-09-26 | Discharge: 2022-09-26 | Disposition: A | Discharge status: Home / Self Care | Payer: Medicare PPO | Source: Ambulatory Visit | Attending: Vascular Surgery | Admitting: Vascular Surgery

## 2022-09-26 ENCOUNTER — Encounter: Payer: Self-pay | Admitting: Vascular Surgery

## 2022-09-26 VITALS — BP 147/84 | HR 102 | Temp 97.9°F | Resp 20 | Ht 70.0 in | Wt 183.0 lb

## 2022-09-26 DIAGNOSIS — I739 Peripheral vascular disease, unspecified: Secondary | ICD-10-CM

## 2022-09-26 DIAGNOSIS — M79604 Pain in right leg: Secondary | ICD-10-CM | POA: Diagnosis not present

## 2022-09-26 DIAGNOSIS — M79605 Pain in left leg: Secondary | ICD-10-CM | POA: Diagnosis not present

## 2022-09-26 NOTE — Progress Notes (Signed)
Patient ID: Joseph Locks., male   DOB: 1946-08-01, 76 y.o.   MRN: [id number]  Reason for Consult: Follow-up   Referred by [name], MD  Subjective:     HPI:  Joseph Mcnay. is a 76 y.o. male with history of left lower extremity bypass surgery and bilateral common iliac artery stenting as well as left carotid endarterectomy by Dr. [name].  I performed an exposure for ALIF about 5 years ago.  States that he does have swelling in his left lower extremity which has been stable.  He also has severe back pain which limits his walking he is using a cart for most of the time but is also able to walk with the help of cane.  He is taking 81 mg aspirin daily and statin.  Past Medical History:  Diagnosis Date   Acute encephalopathy    Acute hypoxemic respiratory failure (HCC)    Acute pulmonary edema (HCC)    Acute respiratory failure with hypoxia [facility])    Aftercare following surgery of the circulatory system, NEC 01/01/2014   Anemia    Arrhythmia    takes metoprolol daily   Arthritis    Asthma    Bilateral leg pain 06/16/2015   CAD (coronary artery disease)    Cardiac asystole (HCC)    Cardiogenic shock (HCC)    Carotid artery occlusion    with Claudication   Chronic back pain    stenosis   Chronic kidney disease    CKD (chronic kidney disease) stage 3, GFR 30-59 ml/min ([place]) 07/18/2015   COPD (chronic obstructive pulmonary disease) (HCC)    Spirva daily and Albuterol as needed   Dementia ([place])    Depression    Diabetes mellitus without complication (HCC)    Type 2 diet controlled.Never been on meds   GERD (gastroesophageal reflux disease)    Gout    takes Uloric daily   HCAP (healthcare-associated pneumonia)    Head injury    as a child   History of colon polyps    benign   History of gastric ulcer    History of hiatal hernia    Hyperlipidemia    takes Atorvastatin and Fenofibrate daily   Hypertension    takes Lisinopril,Amlodipine,and Hydralazine daily    Impaired memory    takes Namenda daily   Intermittent claudication ([place]) 07/18/2015   Overview:  Operative management: The patient will be scheduled for a left femoral to  popliteal bypass in the near future after cardiac risk stratification. He  does have some superficial femoral and tibial artery occlusive disease in  the right leg. However his ABI on the right side is greater than 0.9.  Hopefully he will improve with just a walking program the right leg alone.  If not we could c   Lumbosacral spondylosis with radiculopathy 02/17/2016   Nevus of choroid of left eye 12/08/2014   Numbness    lower left leg and upper right thigh   Occlusion and stenosis of carotid artery without mention of cerebral infarction 01/01/2013   PAD (peripheral artery disease) ([place])    Pneumonia    hx of-couple of yrs ago   Preoperative cardiovascular examination 07/17/2015   Overview:  Echo 2013 with EF 55-60% Lexiscan MPS 11/12/12 with normal perfusion and function  Overview:  Echo 2013 with EF 55-60% Lexiscan MPS 11/12/12 with normal perfusion and function   Serous retinal detachment of left eye 12/08/2014   Shortness of breath dyspnea  with exertion   Status post intraocular lens implant 12/08/2014   Tuberculosis 2006    9 months    Vertigo    Wears glasses    Family History  Problem Relation Age of Onset   Diabetes Mother    Hyperlipidemia Father    Hypertension Father    Other Father        Right Leg Amputation   Heart disease Sister        Aneyrism    Past Surgical History:  Procedure Laterality Date   ABDOMINAL AORTOGRAM W/LOWER EXTREMITY N/A 02/20/2019   Procedure: ABDOMINAL AORTOGRAM W/LOWER EXTREMITY;  Surgeon: [name], MD;  Location: [place] [facility];  Service: Cardiovascular;  Laterality: N/A;   ABDOMINAL EXPOSURE N/A 01/22/2017   Procedure: ABDOMINAL EXPOSURE;  Surgeon: [name], MD;  Location: [place];  Service: Vascular;  Laterality: N/A;   ANTERIOR LAT LUMBAR FUSION  N/A 01/22/2017   Procedure: LUMBAR THREE-FOUR, LUMBAR FOUR-FIVE ANTERIOR LATERAL INTERBODY FUSION;  Surgeon: [name], MD;  Location: [place];  Service: Neurosurgery;  Laterality: N/A;  L3-4 L4-5 Lateral interbody fusion   ANTERIOR LUMBAR FUSION N/A 01/22/2017   Procedure: LUMBAR FIVE-SACRUM ONE ANTERIOR LUMBAR INTERBODY FUSION WITH ABDOMINAL APPROACH BY DR [name];  Surgeon: [name], MD;  Location: [place];  Service: Neurosurgery;  Laterality: N/A;   CAROTID BODY TUMOR EXCISION Left 09/21/2015   Procedure: EXCISE LEFT NECK NODULE WITH LOCAL ;  Surgeon: [name], MD;  Location: [place];  Service: Vascular;  Laterality: Left;   CAROTID ENDARTERECTOMY  Nov. 15,2011   LEFT cea   cataract surgery Bilateral    COLONOSCOPY     EYE SURGERY     FEMORAL-POPLITEAL BYPASS GRAFT Left 08/23/2015   Procedure: LEFT FEMORAL-POPLITEAL ARTERY BYPASS WITH GORETEX GRAFT;  Surgeon: [name], MD;  Location: [place];  Service: Vascular;  Laterality: Left;   FEMORAL-POPLITEAL BYPASS GRAFT Left 02/23/2019   Procedure: REDO LEFT FEMORAL TO POPLITEAL ARTERY BYPASS GRAFT Using Propaten graft;  Surgeon: [name], MD;  Location: [place];  Service: Vascular;  Laterality: Left;   JOINT REPLACEMENT  1980   RIGHT  knee   LUMBAR EPIDURAL INJECTION     LUMBAR LAMINECTOMY/DECOMPRESSION MICRODISCECTOMY Right 02/17/2016   Procedure: Laminectomy and Foraminotomy - Lumbar four-five right;  Surgeon: [name], MD;  Location: [facility];  Service: Neurosurgery;  Laterality: Right;  right   LUMBAR PERCUTANEOUS PEDICLE SCREW 3 LEVEL N/A 01/22/2017   Procedure: LUMBAR THREE-SACRAL ONE PERCUTANEOUS PEDICLE SCREW FIXATION WITH ROBOTIC ASSISTANCE;  Surgeon: [name], MD;  Location: [place];  Service: Neurosurgery;  Laterality: N/A;  L3 to S1 Percutaneous pedicle screw fixation   PERIPHERAL VASCULAR CATHETERIZATION N/A 06/24/2015   Procedure: Abdominal Aortogram;  Surgeon: [name], MD;   Location: [place] [facility];  Service: Cardiovascular;  Laterality: N/A;   Stents  Aug.  23, 1999   Bilateral iliofemoral  stents, [place].   TONSILLECTOMY      Short Social History:  Social History   Tobacco Use   Smoking status: Every Day    Packs/day: 1.00    Types: Cigarettes   Smokeless tobacco: Never  Substance Use Topics   Alcohol use: No    Alcohol/week: 0.0 standard drinks of alcohol    Allergies  Allergen Reactions   Lopressor [Metoprolol Tartrate] Other (See Comments)    Severe bradycardia    Current Outpatient Medications  Medication Sig Dispense Refill  amLODipine (NORVASC) 10 MG tablet Take 10 mg by mouth daily at 10 pm. (2100)     atorvastatin (LIPITOR) 80 MG tablet Take 80 mg by mouth daily at 10 pm. (2100)     hydrALAZINE (APRESOLINE) 25 MG tablet Take 1 tablet by mouth 2 (two) times daily.     losartan (COZAAR) 100 MG tablet Take 100 mg by mouth daily.      memantine (NAMENDA) 10 MG tablet Take 1 tablet by mouth 2 (two) times daily.     triamcinolone ointment (KENALOG) 0.5 %      ULORIC 80 MG TABS Take 80 mg by mouth daily at 10 pm. (2100)  5   No current facility-administered medications for this visit.    Review of Systems  Constitutional:  Constitutional negative. HENT: HENT negative.  Eyes: Eyes negative.  Respiratory: Respiratory negative.  Cardiovascular: Positive for leg swelling.  GI: Gastrointestinal negative.  Musculoskeletal: Positive for back pain, gait problem and leg pain.  Skin: Skin negative.  Hematologic: Hematologic/lymphatic negative.  Psychiatric: Psychiatric negative.        Objective:  Objective   Vitals:   09/26/22 1014  BP: (!) 147/84  Pulse: (!) 102  Resp: 20  Temp: 97.9 F (36.6 C)  SpO2: 96%  Weight: 183 lb (83 kg)  Height: '5\' 10"'$  (1.778 m)   Body mass index is 26.26 kg/m.  Physical Exam HENT:     Head: Normocephalic.     Mouth/Throat:     Mouth: Mucous membranes are moist.  Eyes:     Pupils:  Pupils are equal, round, and reactive to light.  Neck:     Vascular: No carotid bruit.  Cardiovascular:     Rate and Rhythm: Normal rate.  Pulmonary:     Effort: Pulmonary effort is normal.  Abdominal:     General: Abdomen is flat.     Palpations: Abdomen is soft.  Musculoskeletal:        General: Normal range of motion.  Skin:    General: Skin is warm and dry.     Capillary Refill: Capillary refill takes less than 2 seconds.  Neurological:     General: No focal deficit present.     Mental Status: He is alert.  Psychiatric:        Mood and Affect: Mood normal.     Data: Right Carotid Findings:  +----------+--------+--------+--------+--------------------------+---------  +            PSV cm/sEDV cm/sStenosisPlaque Description        Comments    +----------+--------+--------+--------+--------------------------+---------  +  CCA Prox  80      14                                                    +----------+--------+--------+--------+--------------------------+---------  +  CCA Mid   138     21              heterogenous and irregular            +----------+--------+--------+--------+--------------------------+---------  +  CCA Distal110     21              calcific                    Shadowing  +----------+--------+--------+--------+--------------------------+---------  +  ICA Prox  284  75      60-79%  heterogenous                          +----------+--------+--------+--------+--------------------------+---------  +  ICA Mid   270     64                                                    +----------+--------+--------+--------+--------------------------+---------  +  ICA Distal147     30                                                    +----------+--------+--------+--------+--------------------------+---------  +  ECA       239     12              heterogenous and irregular             +----------+--------+--------+--------+--------------------------+---------  +   +----------+--------+-------+--------+-------------------+            PSV cm/sEDV cmsDescribeArm Pressure (mmHG)  +----------+--------+-------+--------+-------------------+  [id number]            Stenotic140                  +----------+--------+-------+--------+-------------------+   +---------+--------+--------+---------------+  VertebralPSV cm/sEDV cm/sBi- directional  +---------+--------+--------+---------------+       Left Carotid Findings:  +----------+--------+--------+--------+------------------+--------+            PSV cm/sEDV cm/sStenosisPlaque DescriptionComments  +----------+--------+--------+--------+------------------+--------+  CCA Prox  103     24                                          +----------+--------+--------+--------+------------------+--------+  CCA Mid   104     24              heterogenous                +----------+--------+--------+--------+------------------+--------+  CCA Distal118     20              heterogenous                +----------+--------+--------+--------+------------------+--------+  ICA Prox  86      13      1-39%   heterogenous                +----------+--------+--------+--------+------------------+--------+  ICA Mid   68      12                                          +----------+--------+--------+--------+------------------+--------+  ICA Distal88      21                                          +----------+--------+--------+--------+------------------+--------+  ECA       449     42      >50%    heterogenous                +----------+--------+--------+--------+------------------+--------+   +----------+--------+--------+----------------+-------------------+  PSV cm/sEDV cm/sDescribe        Arm Pressure (mmHG)   +----------+--------+--------+----------------+-------------------+  [id number]              Multiphasic, [id number]                  +----------+--------+--------+----------------+-------------------+   +---------+--------+--+--------+--+---------+  VertebralPSV cm/s73EDV cm/s17Antegrade  +---------+--------+--+--------+--+---------+           Summary:  Right Carotid: Velocities in the right ICA are consistent with a 60-79%                 stenosis.   Left Carotid: Velocities in the left ICA are consistent with a 1-39%  stenosis.   Vertebrals:  Left vertebral artery demonstrates antegrade flow. Right  vertebral               artery demonstrates bidirectional flow. Brachial pressure               difference noted.  Subclavians: Right subclavian artery was stenotic. Normal flow  hemodynamics were               seen in the left subclavian artery.   Right Stent(s):  +-------------------+--------+---------------+----------+--------+  Common iliac arteryPSV cm/sStenosis       Waveform  Comments  +-------------------+--------+---------------+----------+--------+  Prox to Stent      153                    biphasic            +-------------------+--------+---------------+----------+--------+  Proximal Stent     132                    biphasic            +-------------------+--------+---------------+----------+--------+  Mid Stent          125                    monophasic          +-------------------+--------+---------------+----------+--------+  Distal Stent       164                    monophasicbrisk     +-------------------+--------+---------------+----------+--------+  Distal to Stent    352     50-99% stenosis          tortuous  +-------------------+--------+---------------+----------+--------+     Left Stent(s):  +-------------------+--------+--------+---------+--------+  Common iliac arteryPSV cm/sStenosisWaveform  Comments  +-------------------+--------+--------+---------+--------+  Prox to Stent      89              biphasic           +-------------------+--------+--------+---------+--------+  Proximal Stent     119             triphasic          +-------------------+--------+--------+---------+--------+  Mid Stent          125             biphasic           +-------------------+--------+--------+---------+--------+  Distal Stent       179             biphasic           +-------------------+--------+--------+---------+--------+  Distal to Stent    168             biphasic           +-------------------+--------+--------+---------+--------+   Right CFA 138 cm/s biphasic waveform,  Left CFA 94 cm/s biphasic waveform.    Summary:  Patent bilateral common iliac artery stents with increased velocity in the  right outflow distal to stent at area of tortuosity.( 50 - 99% stenosis).   ABI Findings:  +---------+------------------+-----+----------+--------+  Right    Rt Pressure (mmHg)IndexWaveform  Comment   +---------+------------------+-----+----------+--------+  Brachial 185                                        +---------+------------------+-----+----------+--------+  PTA      98                0.46 monophasic          +---------+------------------+-----+----------+--------+  DP       128               0.60 monophasic          +---------+------------------+-----+----------+--------+  Great Toe85                0.40 Abnormal            +---------+------------------+-----+----------+--------+   +---------+------------------+-----+--------+-------+  Left     Lt Pressure (mmHg)IndexWaveformComment  +---------+------------------+-----+--------+-------+  Brachial 215                                     +---------+------------------+-----+--------+-------+  PTA      183               0.85 biphasic          +---------+------------------+-----+--------+-------+  DP       147               0.68 biphasic         +---------+------------------+-----+--------+-------+  Joseph Hebert               0.53 Abnormal         +---------+------------------+-----+--------+-------+   +-------+-----------+-----------+------------+------------+  ABI/TBIToday's ABIToday's TBIPrevious ABIPrevious TBI  +-------+-----------+-----------+------------+------------+  Right  0.60       0.40       0.52        0.41          +-------+-----------+-----------+------------+------------+  Left   0.85       0.53       0.69        0.56          +-------+-----------+-----------+------------+------------+       Right ABIs appear essentially unchanged. Left ABIs appear increased.     Summary:  Right: Resting right ankle-brachial index indicates moderate right lower  extremity arterial disease. The right toe-brachial index is abnormal.   Left: Resting left ankle-brachial index indicates mild left lower  extremity arterial disease. The left toe-brachial index is abnormal.   +----------+--------+-----+---------------+--------+--------+  LEFT      PSV cm/sRatioStenosis       WaveformComments  +----------+--------+-----+---------------+--------+--------+  EIA Distal556          75-99% stenosis                  +----------+--------+-----+---------------+--------+--------+  POP Mid   86                          biphasic          +----------+--------+-----+---------------+--------+--------+      Left Graft #1:  +--------------------+--------+---------------+--------+--------+  PSV cm/sStenosis       WaveformComments  +--------------------+--------+---------------+--------+--------+  Inflow              285     50-74% stenosisbiphasicdampened  +--------------------+--------+---------------+--------+--------+  Proximal Anastomosis172                     biphasic          +--------------------+--------+---------------+--------+--------+  Proximal Graft      72                     biphasic          +--------------------+--------+---------------+--------+--------+  Mid Graft           65                     biphasic          +--------------------+--------+---------------+--------+--------+  Distal Graft        47                     biphasic          +--------------------+--------+---------------+--------+--------+  Distal Anastomosis  85                     biphasic          +--------------------+--------+---------------+--------+--------+  Outflow             58                     biphasic          +--------------------+--------+---------------+--------+--------+      Summary:  Left: 75 - 99% stenosis in the distal external iliac artery .  Widely patent femoropopliteal bypass without evidence of hemodynamically  significant stenosis.   Known perigraft fluid in the distal thigh measuring approximately 9.05 cm  x 4.05 cm.        Assessment/Plan:    76 year old male with history of multiple areas of vascular disease with a history of carotid endarterectomy and bilateral common iliac artery stents in the left lower extremity bypass.  There is moderate stenosis of the right ICA that is asymptomatic and has decreased ABIs on the right which are also asymptomatic given that he does not walk far enough to have the issue.  Graft in the left lower extremity does have some decreased velocities although it does not meet criteria for impending graft failure.  We will have him follow-up with repeat study in 1 year and will continue medical management.     [name] MD [facility]

## 2022-10-02 DIAGNOSIS — E1122 Type 2 diabetes mellitus with diabetic chronic kidney disease: Secondary | ICD-10-CM | POA: Diagnosis not present

## 2022-10-02 DIAGNOSIS — E785 Hyperlipidemia, unspecified: Secondary | ICD-10-CM | POA: Diagnosis not present

## 2022-10-02 DIAGNOSIS — I129 Hypertensive chronic kidney disease with stage 1 through stage 4 chronic kidney disease, or unspecified chronic kidney disease: Secondary | ICD-10-CM | POA: Diagnosis not present

## 2022-10-02 DIAGNOSIS — N184 Chronic kidney disease, stage 4 (severe): Secondary | ICD-10-CM | POA: Diagnosis not present

## 2022-10-02 DIAGNOSIS — Z23 Encounter for immunization: Secondary | ICD-10-CM | POA: Diagnosis not present

## 2022-10-02 DIAGNOSIS — M109 Gout, unspecified: Secondary | ICD-10-CM | POA: Diagnosis not present

## 2022-10-29 DIAGNOSIS — L820 Inflamed seborrheic keratosis: Secondary | ICD-10-CM | POA: Diagnosis not present

## 2022-10-29 DIAGNOSIS — L578 Other skin changes due to chronic exposure to nonionizing radiation: Secondary | ICD-10-CM | POA: Diagnosis not present

## 2022-10-29 DIAGNOSIS — L821 Other seborrheic keratosis: Secondary | ICD-10-CM | POA: Diagnosis not present

## 2022-10-29 DIAGNOSIS — C44529 Squamous cell carcinoma of skin of other part of trunk: Secondary | ICD-10-CM | POA: Diagnosis not present

## 2022-10-29 DIAGNOSIS — L570 Actinic keratosis: Secondary | ICD-10-CM | POA: Diagnosis not present

## 2022-11-27 DIAGNOSIS — D045 Carcinoma in situ of skin of trunk: Secondary | ICD-10-CM | POA: Diagnosis not present

## 2022-12-28 ENCOUNTER — Telehealth: Payer: Self-pay

## 2022-12-28 NOTE — Patient Outreach (Signed)
  Care Coordination   Initial Visit Note   12/28/2022 Name: Joseph Hebert. MRN: [id number] DOB: [birth year]  Joseph Hebert. is a 77 y.o. year old male who sees [name], MD for primary care. I spoke with  Joseph Hebert. by phone today.  What matters to the patients health and wellness today?  Placed call to patient to review and offer [facility] care coordination program.  Patient reports that he is doing well and denies any needs today.    SDOH assessments and interventions completed:  No     Care Coordination Interventions:  No, not indicated   Follow up plan: No further intervention required.   Encounter Outcome:  Pt. Refused   Tomasa Rand, RN, BSN, CEN [facility] [employer] [phone]

## 2023-01-02 DIAGNOSIS — N184 Chronic kidney disease, stage 4 (severe): Secondary | ICD-10-CM | POA: Diagnosis not present

## 2023-01-02 DIAGNOSIS — E785 Hyperlipidemia, unspecified: Secondary | ICD-10-CM | POA: Diagnosis not present

## 2023-01-02 DIAGNOSIS — I129 Hypertensive chronic kidney disease with stage 1 through stage 4 chronic kidney disease, or unspecified chronic kidney disease: Secondary | ICD-10-CM | POA: Diagnosis not present

## 2023-01-02 DIAGNOSIS — H60509 Unspecified acute noninfective otitis externa, unspecified ear: Secondary | ICD-10-CM | POA: Diagnosis not present

## 2023-01-02 DIAGNOSIS — M109 Gout, unspecified: Secondary | ICD-10-CM | POA: Diagnosis not present

## 2023-01-02 DIAGNOSIS — E1122 Type 2 diabetes mellitus with diabetic chronic kidney disease: Secondary | ICD-10-CM | POA: Diagnosis not present

## 2023-04-02 DIAGNOSIS — D045 Carcinoma in situ of skin of trunk: Secondary | ICD-10-CM | POA: Diagnosis not present

## 2023-04-02 DIAGNOSIS — L219 Seborrheic dermatitis, unspecified: Secondary | ICD-10-CM | POA: Diagnosis not present

## 2023-04-02 DIAGNOSIS — L570 Actinic keratosis: Secondary | ICD-10-CM | POA: Diagnosis not present

## 2023-04-02 DIAGNOSIS — L820 Inflamed seborrheic keratosis: Secondary | ICD-10-CM | POA: Diagnosis not present

## 2023-04-03 DIAGNOSIS — I129 Hypertensive chronic kidney disease with stage 1 through stage 4 chronic kidney disease, or unspecified chronic kidney disease: Secondary | ICD-10-CM | POA: Diagnosis not present

## 2023-04-03 DIAGNOSIS — Z125 Encounter for screening for malignant neoplasm of prostate: Secondary | ICD-10-CM | POA: Diagnosis not present

## 2023-04-03 DIAGNOSIS — E1122 Type 2 diabetes mellitus with diabetic chronic kidney disease: Secondary | ICD-10-CM | POA: Diagnosis not present

## 2023-04-03 DIAGNOSIS — E785 Hyperlipidemia, unspecified: Secondary | ICD-10-CM | POA: Diagnosis not present

## 2023-04-03 DIAGNOSIS — M109 Gout, unspecified: Secondary | ICD-10-CM | POA: Diagnosis not present

## 2023-04-03 DIAGNOSIS — N184 Chronic kidney disease, stage 4 (severe): Secondary | ICD-10-CM | POA: Diagnosis not present

## 2023-04-22 DIAGNOSIS — I739 Peripheral vascular disease, unspecified: Secondary | ICD-10-CM | POA: Diagnosis not present

## 2023-04-22 DIAGNOSIS — N179 Acute kidney failure, unspecified: Secondary | ICD-10-CM | POA: Diagnosis not present

## 2023-04-22 DIAGNOSIS — E785 Hyperlipidemia, unspecified: Secondary | ICD-10-CM | POA: Diagnosis not present

## 2023-04-22 DIAGNOSIS — D631 Anemia in chronic kidney disease: Secondary | ICD-10-CM | POA: Diagnosis not present

## 2023-04-22 DIAGNOSIS — N184 Chronic kidney disease, stage 4 (severe): Secondary | ICD-10-CM | POA: Diagnosis not present

## 2023-04-22 DIAGNOSIS — R809 Proteinuria, unspecified: Secondary | ICD-10-CM | POA: Diagnosis not present

## 2023-04-22 DIAGNOSIS — N2581 Secondary hyperparathyroidism of renal origin: Secondary | ICD-10-CM | POA: Diagnosis not present

## 2023-04-22 DIAGNOSIS — I129 Hypertensive chronic kidney disease with stage 1 through stage 4 chronic kidney disease, or unspecified chronic kidney disease: Secondary | ICD-10-CM | POA: Diagnosis not present

## 2023-04-22 DIAGNOSIS — E1122 Type 2 diabetes mellitus with diabetic chronic kidney disease: Secondary | ICD-10-CM | POA: Diagnosis not present

## 2023-07-03 DIAGNOSIS — I129 Hypertensive chronic kidney disease with stage 1 through stage 4 chronic kidney disease, or unspecified chronic kidney disease: Secondary | ICD-10-CM | POA: Diagnosis not present

## 2023-07-03 DIAGNOSIS — M109 Gout, unspecified: Secondary | ICD-10-CM | POA: Diagnosis not present

## 2023-07-03 DIAGNOSIS — E1122 Type 2 diabetes mellitus with diabetic chronic kidney disease: Secondary | ICD-10-CM | POA: Diagnosis not present

## 2023-07-03 DIAGNOSIS — N184 Chronic kidney disease, stage 4 (severe): Secondary | ICD-10-CM | POA: Diagnosis not present

## 2023-07-03 DIAGNOSIS — E785 Hyperlipidemia, unspecified: Secondary | ICD-10-CM | POA: Diagnosis not present

## 2023-08-06 DIAGNOSIS — N184 Chronic kidney disease, stage 4 (severe): Secondary | ICD-10-CM | POA: Diagnosis not present

## 2023-08-06 DIAGNOSIS — I739 Peripheral vascular disease, unspecified: Secondary | ICD-10-CM | POA: Diagnosis not present

## 2023-08-06 DIAGNOSIS — E785 Hyperlipidemia, unspecified: Secondary | ICD-10-CM | POA: Diagnosis not present

## 2023-08-06 DIAGNOSIS — N2581 Secondary hyperparathyroidism of renal origin: Secondary | ICD-10-CM | POA: Diagnosis not present

## 2023-08-06 DIAGNOSIS — E1122 Type 2 diabetes mellitus with diabetic chronic kidney disease: Secondary | ICD-10-CM | POA: Diagnosis not present

## 2023-08-06 DIAGNOSIS — N189 Chronic kidney disease, unspecified: Secondary | ICD-10-CM | POA: Diagnosis not present

## 2023-08-06 DIAGNOSIS — I129 Hypertensive chronic kidney disease with stage 1 through stage 4 chronic kidney disease, or unspecified chronic kidney disease: Secondary | ICD-10-CM | POA: Diagnosis not present

## 2023-08-06 DIAGNOSIS — R809 Proteinuria, unspecified: Secondary | ICD-10-CM | POA: Diagnosis not present

## 2023-08-06 DIAGNOSIS — N179 Acute kidney failure, unspecified: Secondary | ICD-10-CM | POA: Diagnosis not present

## 2023-08-06 DIAGNOSIS — D631 Anemia in chronic kidney disease: Secondary | ICD-10-CM | POA: Diagnosis not present

## 2023-08-07 LAB — LAB REPORT - SCANNED: EGFR: 16

## 2023-09-09 DIAGNOSIS — Z0000 Encounter for general adult medical examination without abnormal findings: Secondary | ICD-10-CM | POA: Diagnosis not present

## 2023-09-09 DIAGNOSIS — Z9181 History of falling: Secondary | ICD-10-CM | POA: Diagnosis not present

## 2023-09-30 ENCOUNTER — Other Ambulatory Visit: Payer: Self-pay | Admitting: *Deleted

## 2023-09-30 DIAGNOSIS — I6521 Occlusion and stenosis of right carotid artery: Secondary | ICD-10-CM

## 2023-09-30 DIAGNOSIS — I739 Peripheral vascular disease, unspecified: Secondary | ICD-10-CM

## 2023-10-09 ENCOUNTER — Ambulatory Visit (HOSPITAL_COMMUNITY)
Admission: RE | Admit: 2023-10-09 | Discharge: 2023-10-09 | Disposition: A | Discharge status: Home / Self Care | Payer: Medicare PPO | Source: Ambulatory Visit | Attending: Vascular Surgery | Admitting: Vascular Surgery

## 2023-10-09 ENCOUNTER — Ambulatory Visit (INDEPENDENT_AMBULATORY_CARE_PROVIDER_SITE_OTHER)
Admission: RE | Admit: 2023-10-09 | Discharge: 2023-10-09 | Disposition: A | Discharge status: Home / Self Care | Payer: Medicare PPO | Source: Ambulatory Visit | Attending: Vascular Surgery | Admitting: Vascular Surgery

## 2023-10-09 ENCOUNTER — Ambulatory Visit (INDEPENDENT_AMBULATORY_CARE_PROVIDER_SITE_OTHER): Payer: Medicare PPO | Admitting: Physician Assistant

## 2023-10-09 ENCOUNTER — Ambulatory Visit (INDEPENDENT_AMBULATORY_CARE_PROVIDER_SITE_OTHER)
Admission: RE | Admit: 2023-10-09 | Discharge: 2023-10-09 | Disposition: A | Discharge status: Home / Self Care | Payer: Medicare PPO | Source: Ambulatory Visit | Attending: Vascular Surgery

## 2023-10-09 VITALS — BP 138/78 | HR 77 | Temp 97.6°F | Ht 70.0 in | Wt 181.0 lb

## 2023-10-09 DIAGNOSIS — I6523 Occlusion and stenosis of bilateral carotid arteries: Secondary | ICD-10-CM | POA: Diagnosis not present

## 2023-10-09 DIAGNOSIS — I739 Peripheral vascular disease, unspecified: Secondary | ICD-10-CM

## 2023-10-09 DIAGNOSIS — I6521 Occlusion and stenosis of right carotid artery: Secondary | ICD-10-CM

## 2023-10-09 LAB — VAS US ABI WITH/WO TBI
Left ABI: 0.89
Right ABI: 0.67

## 2023-10-09 NOTE — Progress Notes (Signed)
Office Note   History of Present Illness   Joseph Space. is a 77 y.o. ([birth year]) male who presents for surveillance of PAD and carotid artery stenosis.  He has a history of several vascular interventions including:  1) left carotid endarterectomy in 2011 by Dr. Darrick Penna 2) bilateral common iliac artery stenting in July 2016 by Dr. Darrick Penna 3) left femoral to above-knee popliteal artery bypass with PTFE in September 2016 by Dr. Darrick Penna 4) left femoral to above-knee popliteal artery bypass in March 2020 by Dr. Darrick Penna  He returns today for follow-up.  He denies any strokelike symptoms such as slurred speech, sudden weakness/numbness, or sudden visual changes.  He also denies any rest pain or tissue loss.  He mostly mobilizes via powered scooter and does not walk enough to claudicate.  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   He smokes anywhere from 1 to 1-1/2 packs/day.  He says he will never quit smoking.    Current Outpatient Medications  Medication Sig Dispense Refill   amLODipine (NORVASC) 10 MG tablet Take 10 mg by mouth daily at 10 pm. (2100)     atorvastatin (LIPITOR) 80 MG tablet Take 80 mg by mouth daily at 10 pm. (2100)     losartan (COZAAR) 100 MG tablet Take 100 mg by mouth daily.      triamcinolone ointment (KENALOG) 0.5 %      ULORIC 80 MG TABS Take 80 mg by mouth daily at 10 pm. (2100)  5   No current facility-administered medications for this visit.    REVIEW OF SYSTEMS (negative unless checked):   Cardiac:  []  Chest pain or chest pressure? []  Shortness of breath upon activity? []  Shortness of breath when lying flat? []  Irregular heart rhythm?  Vascular:  []  Pain in calf, thigh, or hip brought on by walking? []  Pain in feet at night that wakes you up from your sleep? []  Blood clot in your veins? []  Leg swelling?  Pulmonary:  []  Oxygen at home? []  Productive cough? []   Wheezing?  Neurologic:  []  Sudden weakness in arms or legs? []  Sudden numbness in arms or legs? []  Sudden onset of difficult speaking or slurred speech? []  Temporary loss of vision in one eye? []  Problems with dizziness?  Gastrointestinal:  []  Blood in stool? []  Vomited blood?  Genitourinary:  []  Burning when urinating? []  Blood in urine?  Psychiatric:  []  Major depression  Hematologic:  []  Bleeding problems? []  Problems with blood clotting?  Dermatologic:  []  Rashes or ulcers?  Constitutional:  []  Fever or chills?  Ear/Nose/Throat:  []  Change in hearing? []  Nose bleeds? []  Sore throat?  Musculoskeletal:  []  Back pain? []  Joint pain? []  Muscle pain?   Physical Examination   Vitals:   10/09/23 1017  BP: 138/78  Pulse: 77  Temp: 97.6 F (36.4 C)  SpO2: 95%  Weight: 181 lb (82.1 kg)  Height: 5\' 10"  (1.778 m)   Body mass index is 25.97 kg/m.  General:  WDWN in NAD; vital signs documented above Gait: Not observed HENT: WNL, normocephalic Pulmonary: normal non-labored breathing , without rales, rhonchi,  wheezing Cardiac: regular Abdomen: soft, NT, no masses Skin: without rashes Vascular Exam/Pulses: 1+ left DP pulse.  Nonpalpable pulses on the right.  Monophasic right DP/PT Doppler signals Extremities: without ischemic changes, without gangrene , without cellulitis; without open wounds;  Musculoskeletal: no muscle wasting or  atrophy  Neurologic: A&O X 3;  No focal weakness or paresthesias are detected Psychiatric:  The pt has Normal affect.  Non-Invasive Vascular Imaging ABI (10/09/2023) R:  ABI: 0.67 (0.60),  PT: mono DP: mono TBI:  0.47 L:  ABI: 0.89 (0.85),  PT: bi DP: bi TBI: 0.54  Aortoiliac Duplex (10/09/2023) Patent bilateral common iliac artery stents without stenosis.  50 to 99% stenosis distal to right common iliac artery stent with PSV of 484 cm/s.  50 to 99% stenosis distal to left common iliac artery stent with PSV of 303  cm/s.  LLE Bypass Duplex (10/09/2023) Patent left femoral to above-knee popliteal artery bypass without stenosis.  Stenotic inflow with peak velocity of 507 cm/s.   Carotid Duplex (10/09/2023) Unchanged 60 to 79% stenosis of right ICA.  Increased stenosis of left ICA, 40 to 59%.   Medical Decision Making   Joseph Pribble Bertha Zanin. is a 77 y.o. male who presents for surveillance of PAD  Based on the patient's vascular studies and examination, his ABIs are essentially unchanged.  His right ABI is 0.67 and left ABI 0.89 Duplex demonstrates a patent left femoral to above-knee popliteal artery bypass without stenosis.  There is stenotic inflow with a peak velocity of 507 cm/s.  This is elevated compared to previous duplex, with peak velocity of 285 cm/s.  Fortunately this does not impact waveforms throughout the bypass. Aortoiliac duplex demonstrates patent bilateral common iliac artery stents without stenosis.  There is continued stenosis distal to both stents, rated 50 to 99% stenosis. Carotid duplex demonstrates unchanged 60 to 79% stenosis of the right ICA.  Left ICA stenosis appears to increased from 1 to 39% to 40 to 59% in the past year. The patient denies any symptoms of stroke such as sudden changes to vision, slurred speech, sudden weakness/numbness.  He also denies any symptoms of critical limb ischemia such as rest pain or tissue loss.  He does not ambulate enough to claudicate. On exam he has a 1+ left DP pulse.  He has nonpalpable pedal pulses on the right, with monophasic DP/PT Doppler signals I have discussed this case with Dr. Randie Heinz.  Although there are increased inflow velocities to the left lower extremity bypass, his waveforms are not impacted.  Given that he does not ambulate much we will proceed with close interval follow-up rather than intervention at this time.  Should his stenosis worsen and cause issues with his bypass, he will require intervention. The patient can follow-up with  our office again in 6 months with carotid duplex, ABIs, aortoiliac duplex, and left lower extremity bypass graft duplex   Joseph Dubonnet PA-C [facility] Office: [phone]  Clinic MD: Randie Heinz

## 2023-10-11 DIAGNOSIS — Z23 Encounter for immunization: Secondary | ICD-10-CM | POA: Diagnosis not present

## 2023-10-11 DIAGNOSIS — I129 Hypertensive chronic kidney disease with stage 1 through stage 4 chronic kidney disease, or unspecified chronic kidney disease: Secondary | ICD-10-CM | POA: Diagnosis not present

## 2023-10-11 DIAGNOSIS — N184 Chronic kidney disease, stage 4 (severe): Secondary | ICD-10-CM | POA: Diagnosis not present

## 2023-10-11 DIAGNOSIS — M109 Gout, unspecified: Secondary | ICD-10-CM | POA: Diagnosis not present

## 2023-10-11 DIAGNOSIS — E1122 Type 2 diabetes mellitus with diabetic chronic kidney disease: Secondary | ICD-10-CM | POA: Diagnosis not present

## 2023-10-11 DIAGNOSIS — Z6825 Body mass index (BMI) 25.0-25.9, adult: Secondary | ICD-10-CM | POA: Diagnosis not present

## 2023-10-11 DIAGNOSIS — E785 Hyperlipidemia, unspecified: Secondary | ICD-10-CM | POA: Diagnosis not present

## 2023-10-29 ENCOUNTER — Other Ambulatory Visit: Payer: Self-pay

## 2023-10-29 DIAGNOSIS — I6523 Occlusion and stenosis of bilateral carotid arteries: Secondary | ICD-10-CM

## 2023-10-29 DIAGNOSIS — M79604 Pain in right leg: Secondary | ICD-10-CM

## 2023-10-29 DIAGNOSIS — I739 Peripheral vascular disease, unspecified: Secondary | ICD-10-CM

## 2023-11-06 DIAGNOSIS — N2581 Secondary hyperparathyroidism of renal origin: Secondary | ICD-10-CM | POA: Diagnosis not present

## 2023-11-06 DIAGNOSIS — N184 Chronic kidney disease, stage 4 (severe): Secondary | ICD-10-CM | POA: Diagnosis not present

## 2023-11-06 DIAGNOSIS — R809 Proteinuria, unspecified: Secondary | ICD-10-CM | POA: Diagnosis not present

## 2023-11-06 DIAGNOSIS — E785 Hyperlipidemia, unspecified: Secondary | ICD-10-CM | POA: Diagnosis not present

## 2023-11-06 DIAGNOSIS — E875 Hyperkalemia: Secondary | ICD-10-CM | POA: Diagnosis not present

## 2023-11-06 DIAGNOSIS — D631 Anemia in chronic kidney disease: Secondary | ICD-10-CM | POA: Diagnosis not present

## 2023-11-06 DIAGNOSIS — N179 Acute kidney failure, unspecified: Secondary | ICD-10-CM | POA: Diagnosis not present

## 2023-11-06 DIAGNOSIS — E1122 Type 2 diabetes mellitus with diabetic chronic kidney disease: Secondary | ICD-10-CM | POA: Diagnosis not present

## 2023-11-06 DIAGNOSIS — I129 Hypertensive chronic kidney disease with stage 1 through stage 4 chronic kidney disease, or unspecified chronic kidney disease: Secondary | ICD-10-CM | POA: Diagnosis not present

## 2023-12-31 DIAGNOSIS — E1122 Type 2 diabetes mellitus with diabetic chronic kidney disease: Secondary | ICD-10-CM | POA: Diagnosis not present

## 2023-12-31 DIAGNOSIS — Z6825 Body mass index (BMI) 25.0-25.9, adult: Secondary | ICD-10-CM | POA: Diagnosis not present

## 2023-12-31 DIAGNOSIS — R262 Difficulty in walking, not elsewhere classified: Secondary | ICD-10-CM | POA: Diagnosis not present

## 2023-12-31 DIAGNOSIS — M109 Gout, unspecified: Secondary | ICD-10-CM | POA: Diagnosis not present

## 2023-12-31 DIAGNOSIS — R5383 Other fatigue: Secondary | ICD-10-CM | POA: Diagnosis not present

## 2023-12-31 DIAGNOSIS — E785 Hyperlipidemia, unspecified: Secondary | ICD-10-CM | POA: Diagnosis not present

## 2023-12-31 DIAGNOSIS — J449 Chronic obstructive pulmonary disease, unspecified: Secondary | ICD-10-CM | POA: Diagnosis not present

## 2023-12-31 DIAGNOSIS — I129 Hypertensive chronic kidney disease with stage 1 through stage 4 chronic kidney disease, or unspecified chronic kidney disease: Secondary | ICD-10-CM | POA: Diagnosis not present

## 2023-12-31 DIAGNOSIS — N184 Chronic kidney disease, stage 4 (severe): Secondary | ICD-10-CM | POA: Diagnosis not present

## 2024-01-01 DIAGNOSIS — J449 Chronic obstructive pulmonary disease, unspecified: Secondary | ICD-10-CM | POA: Diagnosis not present

## 2024-01-01 DIAGNOSIS — J90 Pleural effusion, not elsewhere classified: Secondary | ICD-10-CM | POA: Diagnosis not present

## 2024-02-04 DIAGNOSIS — E039 Hypothyroidism, unspecified: Secondary | ICD-10-CM | POA: Diagnosis not present

## 2024-02-10 ENCOUNTER — Other Ambulatory Visit: Payer: Self-pay

## 2024-02-10 ENCOUNTER — Inpatient Hospital Stay (HOSPITAL_COMMUNITY)
Admission: EM | Admit: 2024-02-10 | Discharge: 2024-02-16 | DRG: 673 | Disposition: A | Discharge status: Home / Self Care | Payer: Medicare PPO | Attending: Internal Medicine | Admitting: Internal Medicine

## 2024-02-10 ENCOUNTER — Emergency Department (HOSPITAL_COMMUNITY): Payer: Medicare PPO

## 2024-02-10 ENCOUNTER — Encounter (HOSPITAL_COMMUNITY): Payer: Self-pay

## 2024-02-10 DIAGNOSIS — I120 Hypertensive chronic kidney disease with stage 5 chronic kidney disease or end stage renal disease: Principal | ICD-10-CM | POA: Diagnosis present

## 2024-02-10 DIAGNOSIS — Z862 Personal history of diseases of the blood and blood-forming organs and certain disorders involving the immune mechanism: Secondary | ICD-10-CM

## 2024-02-10 DIAGNOSIS — J9601 Acute respiratory failure with hypoxia: Secondary | ICD-10-CM | POA: Diagnosis present

## 2024-02-10 DIAGNOSIS — Z87442 Personal history of urinary calculi: Secondary | ICD-10-CM

## 2024-02-10 DIAGNOSIS — I10 Essential (primary) hypertension: Secondary | ICD-10-CM | POA: Diagnosis present

## 2024-02-10 DIAGNOSIS — E1151 Type 2 diabetes mellitus with diabetic peripheral angiopathy without gangrene: Secondary | ICD-10-CM | POA: Diagnosis present

## 2024-02-10 DIAGNOSIS — F0390 Unspecified dementia without behavioral disturbance: Secondary | ICD-10-CM | POA: Diagnosis present

## 2024-02-10 DIAGNOSIS — E8770 Fluid overload, unspecified: Secondary | ICD-10-CM | POA: Diagnosis present

## 2024-02-10 DIAGNOSIS — J449 Chronic obstructive pulmonary disease, unspecified: Secondary | ICD-10-CM | POA: Diagnosis present

## 2024-02-10 DIAGNOSIS — Z7982 Long term (current) use of aspirin: Secondary | ICD-10-CM

## 2024-02-10 DIAGNOSIS — J440 Chronic obstructive pulmonary disease with acute lower respiratory infection: Secondary | ICD-10-CM | POA: Diagnosis present

## 2024-02-10 DIAGNOSIS — R34 Anuria and oliguria: Secondary | ICD-10-CM | POA: Diagnosis present

## 2024-02-10 DIAGNOSIS — N179 Acute kidney failure, unspecified: Principal | ICD-10-CM | POA: Diagnosis present

## 2024-02-10 DIAGNOSIS — J90 Pleural effusion, not elsewhere classified: Secondary | ICD-10-CM

## 2024-02-10 DIAGNOSIS — N185 Chronic kidney disease, stage 5: Secondary | ICD-10-CM | POA: Diagnosis not present

## 2024-02-10 DIAGNOSIS — E1122 Type 2 diabetes mellitus with diabetic chronic kidney disease: Secondary | ICD-10-CM | POA: Diagnosis present

## 2024-02-10 DIAGNOSIS — I700 Atherosclerosis of aorta: Secondary | ICD-10-CM | POA: Diagnosis present

## 2024-02-10 DIAGNOSIS — R531 Weakness: Secondary | ICD-10-CM | POA: Diagnosis present

## 2024-02-10 DIAGNOSIS — N186 End stage renal disease: Secondary | ICD-10-CM

## 2024-02-10 DIAGNOSIS — K5730 Diverticulosis of large intestine without perforation or abscess without bleeding: Secondary | ICD-10-CM | POA: Diagnosis present

## 2024-02-10 DIAGNOSIS — F03A0 Unspecified dementia, mild, without behavioral disturbance, psychotic disturbance, mood disturbance, and anxiety: Secondary | ICD-10-CM | POA: Diagnosis not present

## 2024-02-10 DIAGNOSIS — E875 Hyperkalemia: Secondary | ICD-10-CM

## 2024-02-10 DIAGNOSIS — Z79890 Hormone replacement therapy (postmenopausal): Secondary | ICD-10-CM | POA: Diagnosis not present

## 2024-02-10 DIAGNOSIS — J9811 Atelectasis: Secondary | ICD-10-CM | POA: Diagnosis present

## 2024-02-10 DIAGNOSIS — N2581 Secondary hyperparathyroidism of renal origin: Secondary | ICD-10-CM | POA: Diagnosis not present

## 2024-02-10 DIAGNOSIS — K7689 Other specified diseases of liver: Secondary | ICD-10-CM | POA: Diagnosis not present

## 2024-02-10 DIAGNOSIS — Z992 Dependence on renal dialysis: Secondary | ICD-10-CM | POA: Diagnosis not present

## 2024-02-10 DIAGNOSIS — Z4901 Encounter for fitting and adjustment of extracorporeal dialysis catheter: Secondary | ICD-10-CM | POA: Diagnosis not present

## 2024-02-10 DIAGNOSIS — A159 Respiratory tuberculosis unspecified: Secondary | ICD-10-CM | POA: Diagnosis present

## 2024-02-10 DIAGNOSIS — D649 Anemia, unspecified: Secondary | ICD-10-CM | POA: Diagnosis not present

## 2024-02-10 DIAGNOSIS — Z9889 Other specified postprocedural states: Secondary | ICD-10-CM

## 2024-02-10 DIAGNOSIS — Z79899 Other long term (current) drug therapy: Secondary | ICD-10-CM

## 2024-02-10 DIAGNOSIS — M109 Gout, unspecified: Secondary | ICD-10-CM | POA: Diagnosis present

## 2024-02-10 DIAGNOSIS — Z8711 Personal history of peptic ulcer disease: Secondary | ICD-10-CM

## 2024-02-10 DIAGNOSIS — K219 Gastro-esophageal reflux disease without esophagitis: Secondary | ICD-10-CM | POA: Diagnosis present

## 2024-02-10 DIAGNOSIS — R918 Other nonspecific abnormal finding of lung field: Secondary | ICD-10-CM | POA: Diagnosis not present

## 2024-02-10 DIAGNOSIS — J984 Other disorders of lung: Secondary | ICD-10-CM | POA: Diagnosis not present

## 2024-02-10 DIAGNOSIS — Z48813 Encounter for surgical aftercare following surgery on the respiratory system: Secondary | ICD-10-CM | POA: Diagnosis not present

## 2024-02-10 DIAGNOSIS — S42002A Fracture of unspecified part of left clavicle, initial encounter for closed fracture: Secondary | ICD-10-CM | POA: Diagnosis not present

## 2024-02-10 DIAGNOSIS — D631 Anemia in chronic kidney disease: Secondary | ICD-10-CM | POA: Diagnosis present

## 2024-02-10 DIAGNOSIS — Z833 Family history of diabetes mellitus: Secondary | ICD-10-CM | POA: Diagnosis not present

## 2024-02-10 DIAGNOSIS — E119 Type 2 diabetes mellitus without complications: Secondary | ICD-10-CM | POA: Diagnosis not present

## 2024-02-10 DIAGNOSIS — K8020 Calculus of gallbladder without cholecystitis without obstruction: Secondary | ICD-10-CM | POA: Diagnosis present

## 2024-02-10 DIAGNOSIS — R935 Abnormal findings on diagnostic imaging of other abdominal regions, including retroperitoneum: Secondary | ICD-10-CM | POA: Diagnosis not present

## 2024-02-10 DIAGNOSIS — Z993 Dependence on wheelchair: Secondary | ICD-10-CM | POA: Diagnosis not present

## 2024-02-10 DIAGNOSIS — N189 Chronic kidney disease, unspecified: Secondary | ICD-10-CM | POA: Diagnosis present

## 2024-02-10 DIAGNOSIS — I2510 Atherosclerotic heart disease of native coronary artery without angina pectoris: Secondary | ICD-10-CM | POA: Diagnosis present

## 2024-02-10 DIAGNOSIS — N200 Calculus of kidney: Secondary | ICD-10-CM | POA: Diagnosis present

## 2024-02-10 DIAGNOSIS — J439 Emphysema, unspecified: Secondary | ICD-10-CM

## 2024-02-10 DIAGNOSIS — R0603 Acute respiratory distress: Secondary | ICD-10-CM | POA: Diagnosis not present

## 2024-02-10 DIAGNOSIS — Z860100 Personal history of colon polyps, unspecified: Secondary | ICD-10-CM

## 2024-02-10 DIAGNOSIS — Z83438 Family history of other disorder of lipoprotein metabolism and other lipidemia: Secondary | ICD-10-CM

## 2024-02-10 DIAGNOSIS — F17210 Nicotine dependence, cigarettes, uncomplicated: Secondary | ICD-10-CM | POA: Diagnosis present

## 2024-02-10 DIAGNOSIS — Z888 Allergy status to other drugs, medicaments and biological substances status: Secondary | ICD-10-CM

## 2024-02-10 DIAGNOSIS — R809 Proteinuria, unspecified: Secondary | ICD-10-CM | POA: Diagnosis not present

## 2024-02-10 DIAGNOSIS — Z8674 Personal history of sudden cardiac arrest: Secondary | ICD-10-CM

## 2024-02-10 DIAGNOSIS — J189 Pneumonia, unspecified organism: Secondary | ICD-10-CM | POA: Diagnosis not present

## 2024-02-10 DIAGNOSIS — E872 Acidosis, unspecified: Secondary | ICD-10-CM | POA: Diagnosis present

## 2024-02-10 DIAGNOSIS — Z8249 Family history of ischemic heart disease and other diseases of the circulatory system: Secondary | ICD-10-CM | POA: Diagnosis not present

## 2024-02-10 DIAGNOSIS — R432 Parageusia: Secondary | ICD-10-CM | POA: Diagnosis present

## 2024-02-10 DIAGNOSIS — E785 Hyperlipidemia, unspecified: Secondary | ICD-10-CM | POA: Diagnosis present

## 2024-02-10 DIAGNOSIS — I129 Hypertensive chronic kidney disease with stage 1 through stage 4 chronic kidney disease, or unspecified chronic kidney disease: Secondary | ICD-10-CM | POA: Diagnosis not present

## 2024-02-10 DIAGNOSIS — Z8611 Personal history of tuberculosis: Secondary | ICD-10-CM

## 2024-02-10 DIAGNOSIS — N184 Chronic kidney disease, stage 4 (severe): Secondary | ICD-10-CM | POA: Diagnosis not present

## 2024-02-10 DIAGNOSIS — E1110 Type 2 diabetes mellitus with ketoacidosis without coma: Secondary | ICD-10-CM | POA: Diagnosis not present

## 2024-02-10 LAB — COMPREHENSIVE METABOLIC PANEL
ALT: 13 U/L (ref 0–44)
AST: 14 U/L — ABNORMAL LOW (ref 15–41)
Albumin: 3.3 g/dL — ABNORMAL LOW (ref 3.5–5.0)
Alkaline Phosphatase: 80 U/L (ref 38–126)
Anion gap: 19 — ABNORMAL HIGH (ref 5–15)
BUN: 104 mg/dL — ABNORMAL HIGH (ref 8–23)
CO2: 14 mmol/L — ABNORMAL LOW (ref 22–32)
Calcium: 8.3 mg/dL — ABNORMAL LOW (ref 8.9–10.3)
Chloride: 103 mmol/L (ref 98–111)
Creatinine, Ser: 11.09 mg/dL — ABNORMAL HIGH (ref 0.61–1.24)
GFR, Estimated: 4 mL/min — ABNORMAL LOW (ref >60)
Glucose, Bld: 126 mg/dL — ABNORMAL HIGH (ref 70–99)
Potassium: 5.9 mmol/L — ABNORMAL HIGH (ref 3.5–5.1)
Sodium: 136 mmol/L (ref 135–145)
Total Bilirubin: 0.4 mg/dL (ref 0.0–1.2)
Total Protein: 6.8 g/dL (ref 6.5–8.1)

## 2024-02-10 LAB — CBC
HCT: 35.4 % — ABNORMAL LOW (ref 39.0–52.0)
Hemoglobin: 11 g/dL — ABNORMAL LOW (ref 13.0–17.0)
MCH: 29.6 pg (ref 26.0–34.0)
MCHC: 31.1 g/dL (ref 30.0–36.0)
MCV: 95.2 fL (ref 80.0–100.0)
Platelets: 328 10*3/uL (ref 150–400)
RBC: 3.72 MIL/uL — ABNORMAL LOW (ref 4.22–5.81)
RDW: 15.6 % — ABNORMAL HIGH (ref 11.5–15.5)
WBC: 8.7 10*3/uL (ref 4.0–10.5)
nRBC: 0 % (ref 0.0–0.2)

## 2024-02-10 LAB — LIPASE, BLOOD: Lipase: 51 U/L (ref 11–51)

## 2024-02-10 MED ORDER — DEXTROSE 50 % IV SOLN
1.0000 | Freq: Once | INTRAVENOUS | Status: AC
  Administered 2024-02-11: 50 mL via INTRAVENOUS
  Filled 2024-02-10: qty 50

## 2024-02-10 MED ORDER — ALBUTEROL SULFATE (2.5 MG/3ML) 0.083% IN NEBU
2.5000 mg | INHALATION_SOLUTION | Freq: Once | RESPIRATORY_TRACT | Status: AC
Start: 2024-02-10 — End: 2024-02-10
  Administered 2024-02-10: 2.5 mg via RESPIRATORY_TRACT
  Filled 2024-02-10: qty 3

## 2024-02-10 MED ORDER — SODIUM ZIRCONIUM CYCLOSILICATE 10 G PO PACK
10.0000 g | PACK | Freq: Once | ORAL | Status: AC
Start: 2024-02-10 — End: 2024-02-10
  Administered 2024-02-10: 10 g via ORAL
  Filled 2024-02-10: qty 1

## 2024-02-10 MED ORDER — SODIUM CHLORIDE 0.9 % IV SOLN: INTRAVENOUS | Status: AC

## 2024-02-10 MED ORDER — SODIUM CHLORIDE 0.9 % IV BOLUS
500.0000 mL | Freq: Once | INTRAVENOUS | Status: AC
  Administered 2024-02-11: 500 mL via INTRAVENOUS

## 2024-02-10 MED ORDER — INSULIN ASPART 100 UNIT/ML IV SOLN
5.0000 [IU] | Freq: Once | INTRAVENOUS | Status: AC
  Administered 2024-02-11: 5 [IU] via INTRAVENOUS

## 2024-02-10 MED ORDER — SODIUM BICARBONATE 650 MG PO TABS
650.0000 mg | ORAL_TABLET | Freq: Two times a day (BID) | ORAL | Status: DC
  Administered 2024-02-10 – 2024-02-13 (×7): 650 mg via ORAL
  Filled 2024-02-10 (×7): qty 1

## 2024-02-10 MED ORDER — SODIUM BICARBONATE 8.4 % IV SOLN
50.0000 meq | Freq: Once | INTRAVENOUS | Status: AC
  Administered 2024-02-11: 50 meq via INTRAVENOUS
  Filled 2024-02-10: qty 50

## 2024-02-10 NOTE — ED Provider Notes (Signed)
 [name] AT [facility] Provider Note   CSN: [id number] Arrival date & time: 02/10/24  1748     History  Chief Complaint  Patient presents with   Abnormal Lab    Joseph Hebert. is a 78 y.o. male.   Abnormal Lab Patient presents for abnormal outpatient lab work.  He was seen by nephrology over the weekend.  Lab work at the time showed worsening kidney function.  Per chart review, creatinine 2 years ago was 2.5.  In August of last year, it was 3.6.  He does endorse decreased urine output over the past 2 weeks.  He has had mid abdominal pain, but denies any currently.  He has had worsened generalized weakness which has limited his mobility at home.     Home Medications Prior to Admission medications   Medication Sig Start Date End Date Taking? Authorizing Provider  amLODipine (NORVASC) 10 MG tablet Take 10 mg by mouth daily at 10 pm. (2100)    [provider]  atorvastatin (LIPITOR) 80 MG tablet Take 80 mg by mouth daily at 10 pm. (2100)    [provider]  losartan (COZAAR) 100 MG tablet Take 100 mg by mouth daily.  03/16/19   [provider]  triamcinolone ointment (KENALOG) 0.5 %  10/18/20   [provider]  ULORIC 80 MG TABS Take 80 mg by mouth daily at 10 pm. (2100) 11/12/16   [provider]      Allergies    Lopressor [metoprolol tartrate]    Review of Systems   Review of Systems  Constitutional:  Positive for fatigue.  Gastrointestinal:  Positive for abdominal pain.  Genitourinary:  Positive for decreased urine volume.  Neurological:  Positive for weakness (Generalized).  All other systems reviewed and are negative.   Physical Exam Updated Vital Signs BP 134/63 (BP Location: Left Arm)   Pulse 70   Temp 97.8 F (36.6 C)   Resp 19   Ht 5\' 10"  (1.778 m)   Wt 82.1 kg   SpO2 99%   BMI 25.97 kg/m  Physical Exam Vitals and nursing note reviewed.  Constitutional:      General: He  is not in acute distress.    Appearance: Normal appearance. He is well-developed. He is not ill-appearing, toxic-appearing or diaphoretic.  HENT:     Head: Normocephalic and atraumatic.     Right Ear: External ear normal.     Left Ear: External ear normal.     Nose: Nose normal.     Mouth/Throat:     Mouth: Mucous membranes are moist.  Eyes:     Extraocular Movements: Extraocular movements intact.     Conjunctiva/sclera: Conjunctivae normal.  Cardiovascular:     Rate and Rhythm: Normal rate and regular rhythm.  Pulmonary:     Effort: Pulmonary effort is normal. No respiratory distress.  Abdominal:     General: There is no distension.     Palpations: Abdomen is soft.     Tenderness: There is no abdominal tenderness.  Musculoskeletal:        General: No swelling. Normal range of motion.     Cervical back: Normal range of motion and neck supple.  Skin:    General: Skin is warm and dry.     Coloration: Skin is not jaundiced or pale.  Neurological:     General: No focal deficit present.     Mental Status: He is alert and oriented to person, place,  and time.  Psychiatric:        Mood and Affect: Mood normal.        Behavior: Behavior normal.     ED Results / Procedures / Treatments   Labs (all labs ordered are listed, but only abnormal results are displayed) Labs Reviewed  COMPREHENSIVE METABOLIC PANEL - Abnormal; Notable for the following components:      Result Value   Potassium 5.9 (*)    CO2 14 (*)    Glucose, Bld 126 (*)    BUN 104 (*)    Creatinine, Ser 11.09 (*)    Calcium 8.3 (*)    Albumin 3.3 (*)    AST 14 (*)    GFR, Estimated 4 (*)    Anion gap 19 (*)    All other components within normal limits  CBC - Abnormal; Notable for the following components:   RBC 3.72 (*)    Hemoglobin 11.0 (*)    HCT 35.4 (*)    RDW 15.6 (*)    All other components within normal limits  LIPASE, BLOOD  URINALYSIS, ROUTINE W REFLEX MICROSCOPIC  RENAL FUNCTION PANEL  SODIUM,  URINE, RANDOM  CREATININE, URINE, RANDOM    EKG None  Radiology No results found.  Procedures Procedures    Medications Ordered in ED Medications  sodium zirconium cyclosilicate (LOKELMA) packet 10 g (has no administration in time range)  insulin aspart (novoLOG) injection 5 Units (has no administration in time range)    And  dextrose 50 % solution 50 mL (has no administration in time range)  sodium chloride 0.9 % bolus 500 mL (has no administration in time range)  sodium bicarbonate injection 50 mEq (has no administration in time range)  sodium bicarbonate tablet 650 mg (has no administration in time range)  0.9 %  sodium chloride infusion (has no administration in time range)  albuterol (PROVENTIL) (2.5 MG/3ML) 0.083% nebulizer solution 2.5 mg (2.5 mg Nebulization Given 02/10/24 2056)    ED Course/ Medical Decision Making/ A&P                                 Medical Decision Making Amount and/or Complexity of Data Reviewed Labs: ordered. Radiology: ordered.  Risk OTC drugs. Prescription drug management. Decision regarding hospitalization.   This patient presents to the ED for concern of abnormal lab results, this involves an extensive number of treatment options, and is a complaint that carries with it a high risk of complications and morbidity.  The differential diagnosis includes lab error, progression of CKD, urine retention, polypharmacy, dehydration, obstructive uropathy   Co morbidities that complicate the patient evaluation  CKD, HTN, HLD, PAD, anemia, arthritis, asthma, CAD, COPD, GERD   Additional history obtained:  Additional history obtained from patient's family External records from outside source obtained and reviewed including EMR   Lab Tests:  I Ordered, and personally interpreted labs.  The pertinent results include: Renal failure with creatinine of 11.09.  Azotemia, anion gap metabolic acidosis, hyperkalemia.   Imaging Studies  ordered:  I ordered imaging studies including CT stone study I independently visualized and interpreted imaging which showed pending at time of admission I agree with the radiologist interpretation   Cardiac Monitoring: / EKG:  The patient was maintained on a cardiac monitor.  I personally viewed and interpreted the cardiac monitored which showed an underlying rhythm of: Sinus rhythm   Consultations Obtained:  I requested consultation with  the nephrologist, Dr. Arrie Aran,  and discussed lab and imaging findings as well as pertinent plan - they recommend: Gentle hydration overnight, agree with Foley catheter placement, agree with temporization of hyperkalemia, agree with CT stone study, sodium bicarb, nephrology will see in consult   Problem List / ED Course / Critical interventions / Medication management  Patient presents for abnormal lab work.  This was done by his kidney doctor earlier today.  Although patient does have CKD, his creatinine did bump from 3.6 in August to 11 today.  On exam, patient is well-appearing.  He states that he has not been able to urinate since this morning.  On bedside ultrasound, he has 250 cc in his bladder.  I suspect some element of urine retention.  I spoke with his kidney doctor over the telephone who agrees with placement of Foley catheter.  Temporizing medications were ordered for his hyperkalemia.  Nephrology ordered sodium bicarbonate.  Nephrology will see in consult.  Patient was admitted for further management. I ordered medication including IV fluids for hydration; dextrose/insulin, albuterol, Lokelma for hyperkalemia Reevaluation of the patient after these medicines showed that the patient improved I have reviewed the patients home medicines and have made adjustments as needed   Social Determinants of Health:  Has access to outpatient care  CRITICAL CARE Performed by: Gloris Manchester   Total critical care time: 32 minutes  Critical care time  was exclusive of separately billable procedures and treating other patients.  Critical care was necessary to treat or prevent imminent or life-threatening deterioration.  Critical care was time spent personally by me on the following activities: development of treatment plan with patient and/or surrogate as well as nursing, discussions with consultants, evaluation of patient's response to treatment, examination of patient, obtaining history from patient or surrogate, ordering and performing treatments and interventions, ordering and review of laboratory studies, ordering and review of radiographic studies, pulse oximetry and re-evaluation of patient's condition.         Final Clinical Impression(s) / ED Diagnoses Final diagnoses:  Acute renal failure, unspecified acute renal failure type (HCC)  Hyperkalemia    Rx / DC Orders ED Discharge Orders     None         Gloris Manchester, MD 02/10/24 2129

## 2024-02-10 NOTE — ED Provider Triage Note (Signed)
 Emergency Medicine Provider Triage Evaluation Note  Joseph Hebert. , a 78 y.o. male  was evaluated in triage.  Pt complains of abnormal lab.  Reports that he was seen at nephrology this past weekend where he was noted to be in renal failure.  Reports that he was called today by nephrology doctor and advised to come to ED.  Denies nausea vomiting or diarrhea.  Reports decreased urinary output over the last week or 2.  Also reporting abdominal pain in his mid abdomen that does not radiate.  Denies chest pain or shortness of breath.  Review of Systems  Positive:  Negative:   Physical Exam  BP 134/63 (BP Location: Left Arm)   Pulse 70   Temp 97.8 F (36.6 C)   Resp 19   Ht 5\' 10"  (1.778 m)   Wt 82.1 kg   SpO2 96%   BMI 25.97 kg/m  Gen:   Awake, no distress   Resp:  Normal effort  MSK:   Moves extremities without difficulty  Other:    Medical Decision Making  Medically screening exam initiated at 7:13 PM.  Appropriate orders placed.  Joseph Hebert. was informed that the remainder of the evaluation will be completed by another provider, this initial triage assessment does not replace that evaluation, and the importance of remaining in the ED until their evaluation is complete.     Al Decant, PA-C 02/10/24 1915

## 2024-02-10 NOTE — ED Notes (Signed)
 IV team at bedside

## 2024-02-10 NOTE — ED Triage Notes (Signed)
 Pt sent by PCP for kidney failure from abnormal lab test. Pt c/o abdominal [id number]. Pt states urinating less than usual last 2-3wks

## 2024-02-10 NOTE — Consult Note (Signed)
 Reason for Consult: AKI/CKD stage IV Referring Physician: Durwin Nora, MD  Joseph Amor. is an 78 y.o. male with a PMH significant for HTN, DM type 2, PAD s/p stents, left femoral popliteal bypass graft, carotid artery occlusion s/p CEA, COPD, HLD, DDD s/p L3-L5 fusion, h/o PEA arrest, and CKD stage IV who was seen in our office today for routine follow up.  He has not been doing well over the past few weeks with increased difficulty ambulating.  He also lost 20 lbs in the last 3 months and admitted to anorexia, abdominal pain, decreased po intake, and dysgeusia.  Vital signs were stable and he denied any nausea, vomiting, and occasional diarrhea.  Labs from today were notable for Na 139, K 6.2, Cl 106, Co2 17, BUN 104, Cr 10.61, gluc 104, Hgb 11.2 and was directed to go to the ED to further evaluate his AKI/CKD stage IV and hyperkalemia.    He denies any edema, SOB, orthopnea, or PND.  The trend in Scr is seen below.  Trend in Creatinine: Creatinine, Ser  Date/Time Value Ref Range Status  02/10/2024 11.09 (HH)    02/10/2024 10.61 (HH) 0.76 - 1.27 mg/dL   47/42/5956 3.87 (H) 5.64 - 1.27 mg/dL   33/29/5188 4.16 (H) 6.06 - 1.27 mg/dL   30/16/0109 32:35 PM 5.73 (H) 0.76 - 1.27 mg/dL   22/01/5426 06:23 AM 7.62 (H) 0.61 - 1.24 mg/dL Final  83/15/1761 60:73 AM 2.52 (H) 0.61 - 1.24 mg/dL Final  71/05/2693 85:46 AM 2.64 (H) 0.61 - 1.24 mg/dL Final  27/02/5008 38:18 AM 2.08 (H) 0.61 - 1.24 mg/dL Final  29/93/7169 67:89 AM 2.30 (H) 0.61 - 1.24 mg/dL Final  38/09/1750 02:58 AM 2.77 (H) 0.61 - 1.24 mg/dL Final  52/77/8242 35:36 AM 2.90 (H) 0.61 - 1.24 mg/dL Final  14/43/1540 08:67 AM 1.56 (H) 0.61 - 1.24 mg/dL Final  61/95/0932 67:12 AM 1.45 (H) 0.61 - 1.24 mg/dL Final  45/80/9983 38:25 AM 1.60 (H) 0.61 - 1.24 mg/dL Final  05/39/7673 41:93 AM 1.81 (H) 0.61 - 1.24 mg/dL Final  79/01/4096 35:32 AM 1.75 (H) 0.61 - 1.24 mg/dL Final  99/24/2683 41:96 AM 1.98 (H) 0.61 - 1.24 mg/dL Final  22/29/7989 21:19 AM  2.45 (H) 0.61 - 1.24 mg/dL Final  41/74/0814 48:18 AM 2.85 (H) 0.61 - 1.24 mg/dL Final  56/31/4970 26:37 AM 2.92 (H) 0.61 - 1.24 mg/dL Final  85/88/5027 74:12 AM 3.23 (H) 0.61 - 1.24 mg/dL Final  87/86/7672 09:47 AM 4.19 (H) 0.61 - 1.24 mg/dL Final  09/62/8366 29:47 AM 4.84 (H) 0.61 - 1.24 mg/dL Final  65/46/5035 46:56 AM 4.07 (H) 0.61 - 1.24 mg/dL Final  81/27/5170 01:74 PM 3.64 (H) 0.61 - 1.24 mg/dL Final  94/49/6759 16:38 AM 4.64 (H) 0.61 - 1.24 mg/dL Final  46/65/9935 70:17 AM 4.33 (H) 0.61 - 1.24 mg/dL Final  79/39/0300 92:33 AM 3.08 (H) 0.61 - 1.24 mg/dL Final  00/76/2263 33:54 PM 2.63 (H) 0.61 - 1.24 mg/dL Final  56/25/6389 37:34 AM 2.38 (H) 0.61 - 1.24 mg/dL Final  28/76/8115 72:62 AM 1.85 (H) 0.61 - 1.24 mg/dL Final  03/55/9741 63:84 AM 1.91 (H) 0.61 - 1.24 mg/dL Final  53/64/6803 21:22 AM 1.78 (H) 0.61 - 1.24 mg/dL Final  48/25/0037 04:88 AM 2.32 (H) 0.61 - 1.24 mg/dL Final  89/16/9450 38:88 AM 2.26 (H) 0.61 - 1.24 mg/dL Final  28/00/3491 79:15 AM 1.90 (H) 0.61 - 1.24 mg/dL Final  05/69/7948 01:65 AM 1.80 (H) 0.61 - 1.24 mg/dL Final  53/74/8270 78:67  AM 1.86 (H) 0.4 - 1.5 mg/dL Final  16/09/9603 54:09 PM 1.74 (H) 0.4 - 1.5 mg/dL Final    PMH:   Past Medical History:  Diagnosis Date   Acute encephalopathy    Acute hypoxemic respiratory failure (HCC)    Acute pulmonary edema (HCC)    Acute respiratory failure with hypoxia [facility])    Aftercare following surgery of the circulatory system, NEC 01/01/2014   Anemia    Arrhythmia    takes metoprolol daily   Arthritis    Asthma    Bilateral leg pain 06/16/2015   CAD (coronary artery disease)    Cardiac asystole (HCC)    Cardiogenic shock (HCC)    Carotid artery occlusion    with Claudication   Chronic back pain    stenosis   Chronic kidney disease    CKD (chronic kidney disease) stage 3, GFR 30-59 ml/min (HCC) 07/18/2015   COPD (chronic obstructive pulmonary disease) (HCC)    Spirva daily and Albuterol as needed   Dementia  (HCC)    Depression    Diabetes mellitus without complication (HCC)    Type 2 diet controlled.Never been on meds   GERD (gastroesophageal reflux disease)    Gout    takes Uloric daily   HCAP (healthcare-associated pneumonia)    Head injury    as a child   History of colon polyps    benign   History of gastric ulcer    History of hiatal hernia    Hyperlipidemia    takes Atorvastatin and Fenofibrate daily   Hypertension    takes Lisinopril,Amlodipine,and Hydralazine daily   Impaired memory    takes Namenda daily   Intermittent claudication (HCC) 07/18/2015   Overview:  Operative management: The patient will be scheduled for a left femoral to  popliteal bypass in the near future after cardiac risk stratification. He  does have some superficial femoral and tibial artery occlusive disease in  the right leg. However his ABI on the right side is greater than 0.9.  Hopefully he will improve with just a walking program the right leg alone.  If not we could c   Lumbosacral spondylosis with radiculopathy 02/17/2016   Nevus of choroid of left eye 12/08/2014   Numbness    lower left leg and upper right thigh   Occlusion and stenosis of carotid artery without mention of cerebral infarction 01/01/2013   PAD (peripheral artery disease) (HCC)    Pneumonia    hx of-couple of yrs ago   Preoperative cardiovascular examination 07/17/2015   Overview:  Echo 2013 with EF 55-60% Lexiscan MPS 11/12/12 with normal perfusion and function  Overview:  Echo 2013 with EF 55-60% Lexiscan MPS 11/12/12 with normal perfusion and function   Serous retinal detachment of left eye 12/08/2014   Shortness of breath dyspnea    with exertion   Status post intraocular lens implant 12/08/2014   Tuberculosis 2006    9 months    Vertigo    Wears glasses     PSH:   Past Surgical History:  Procedure Laterality Date   ABDOMINAL AORTOGRAM W/LOWER EXTREMITY N/A 02/20/2019   Procedure: ABDOMINAL AORTOGRAM W/LOWER EXTREMITY;   Surgeon: Sherren Kerns, MD;  Location: [facility] INVASIVE CV LAB;  Service: Cardiovascular;  Laterality: N/A;   ABDOMINAL EXPOSURE N/A 01/22/2017   Procedure: ABDOMINAL EXPOSURE;  Surgeon: Maeola Harman, MD;  Location: [facility];  Service: Vascular;  Laterality: N/A;   ANTERIOR LAT LUMBAR FUSION N/A 01/22/2017   Procedure:  LUMBAR THREE-FOUR, LUMBAR FOUR-FIVE ANTERIOR LATERAL INTERBODY FUSION;  Surgeon: Loura Halt Ditty, MD;  Location: [facility] OR;  Service: Neurosurgery;  Laterality: N/A;  L3-4 L4-5 Lateral interbody fusion   ANTERIOR LUMBAR FUSION N/A 01/22/2017   Procedure: LUMBAR FIVE-SACRUM ONE ANTERIOR LUMBAR INTERBODY FUSION WITH ABDOMINAL APPROACH BY DR Randie Heinz;  Surgeon: Loura Halt Ditty, MD;  Location: [facility] OR;  Service: Neurosurgery;  Laterality: N/A;   CAROTID BODY TUMOR EXCISION Left 09/21/2015   Procedure: EXCISE LEFT NECK NODULE WITH LOCAL ;  Surgeon: Sherren Kerns, MD;  Location: [facility] OR;  Service: Vascular;  Laterality: Left;   CAROTID ENDARTERECTOMY  Nov. 15,2011   LEFT cea   cataract surgery Bilateral    COLONOSCOPY     EYE SURGERY     FEMORAL-POPLITEAL BYPASS GRAFT Left 08/23/2015   Procedure: LEFT FEMORAL-POPLITEAL ARTERY BYPASS WITH GORETEX GRAFT;  Surgeon: Sherren Kerns, MD;  Location: [facility] OR;  Service: Vascular;  Laterality: Left;   FEMORAL-POPLITEAL BYPASS GRAFT Left 02/23/2019   Procedure: REDO LEFT FEMORAL TO POPLITEAL ARTERY BYPASS GRAFT Using Propaten graft;  Surgeon: Sherren Kerns, MD;  Location: [facility] OR;  Service: Vascular;  Laterality: Left;   JOINT REPLACEMENT  1980   RIGHT  knee   LUMBAR EPIDURAL INJECTION     LUMBAR LAMINECTOMY/DECOMPRESSION MICRODISCECTOMY Right 02/17/2016   Procedure: Laminectomy and Foraminotomy - Lumbar four-five right;  Surgeon: Loura Halt Ditty, MD;  Location: [facility] NEURO ORS;  Service: Neurosurgery;  Laterality: Right;  right   LUMBAR PERCUTANEOUS PEDICLE SCREW 3 LEVEL N/A 01/22/2017   Procedure: LUMBAR THREE-SACRAL ONE PERCUTANEOUS PEDICLE  SCREW FIXATION WITH ROBOTIC ASSISTANCE;  Surgeon: Loura Halt Ditty, MD;  Location: [facility] OR;  Service: Neurosurgery;  Laterality: N/A;  L3 to S1 Percutaneous pedicle screw fixation   PERIPHERAL VASCULAR CATHETERIZATION N/A 06/24/2015   Procedure: Abdominal Aortogram;  Surgeon: Sherren Kerns, MD;  Location: [facility] INVASIVE CV LAB;  Service: Cardiovascular;  Laterality: N/A;   Stents  Aug.  23, 1999   Bilateral iliofemoral  stents, [facility]   TONSILLECTOMY      Allergies:  Allergies  Allergen Reactions   Lopressor [Metoprolol Tartrate] Other (See Comments)    Severe bradycardia    Medications:   Prior to Admission medications   Medication Sig Start Date End Date Taking? Authorizing Provider  amLODipine (NORVASC) 10 MG tablet Take 10 mg by mouth daily at 10 pm. (2100)    [provider]  atorvastatin (LIPITOR) 80 MG tablet Take 80 mg by mouth daily at 10 pm. (2100)    [provider]  losartan (COZAAR) 100 MG tablet Take 100 mg by mouth daily.  03/16/19   [provider]  triamcinolone ointment (KENALOG) 0.5 %  10/18/20   [provider]  ULORIC 80 MG TABS Take 80 mg by mouth daily at 10 pm. (2100) 11/12/16   [provider]    Inpatient medications:   Discontinued Meds:  There are no discontinued medications.  Social History:  reports that he has been smoking cigarettes. He has never used smokeless tobacco. He reports that he does not drink alcohol and does not use drugs.  Family History:   Family History  Problem Relation Age of Onset   Diabetes Mother    Hyperlipidemia Father    Hypertension Father    Other Father        Right Leg Amputation   Heart disease Sister        Aneyrism     Pertinent items  are noted in HPI. Weight change:  No intake or output data in the 24 hours ending 02/10/24 1919 BP 134/63 (BP Location: Left Arm)   Pulse 70   Temp 97.8 F (36.6 C)   Resp 19   Ht 5\' 10"  (1.778 m)   Wt 82.1 kg   SpO2 96%   BMI  25.97 kg/m  Vitals:   02/10/24 1903 02/10/24 1907  BP:  134/63  Pulse:  70  Resp:  19  Temp:  97.8 F (36.6 C)  SpO2:  96%  Weight: 82.1 kg   Height: 5\' 10"  (1.778 m)      General appearance: cooperative, fatigued, and no distress Head: Normocephalic, without obvious abnormality, atraumatic Eyes: negative findings: lids and lashes normal, conjunctivae and sclerae normal, and corneas clear Resp: clear to auscultation bilaterally Cardio: regular rate and rhythm, S1, S2 normal, no murmur, click, rub or gallop GI: soft, non-tender; bowel sounds normal; no masses,  no organomegaly Extremities: extremities normal, atraumatic, no cyanosis or edema  Labs: Basic Metabolic Panel: No results for input(s): "NA", "K", "CL", "CO2", "GLUCOSE", "BUN", "CREATININE", "ALBUMIN", "CALCIUM", "PHOS" in the last 168 hours. Liver Function Tests: No results for input(s): "AST", "ALT", "ALKPHOS", "BILITOT", "PROT", "ALBUMIN" in the last 168 hours. No results for input(s): "LIPASE", "AMYLASE" in the last 168 hours. No results for input(s): "AMMONIA" in the last 168 hours. CBC: No results for input(s): "WBC", "NEUTROABS", "HGB", "HCT", "MCV", "PLT" in the last 168 hours. PT/INR: @LABRCNTIP (inr:5) Cardiac Enzymes: )No results for input(s): "CKTOTAL", "CKMB", "CKMBINDEX", "TROPONINI" in the last 168 hours. CBG: No results for input(s): "GLUCAP" in the last 168 hours.  Iron Studies: No results for input(s): "IRON", "TIBC", "TRANSFERRIN", "FERRITIN" in the last 168 hours.  Xrays/Other Studies: No results found.   Assessment/Plan:  AKI/CKD stage IV - unclear if this is progression of his CKD or AKI related to poor po intake.  Also need to r/o obstruction with [place].  Recommend starting IVF's and follow UOP and BUN/Cr.  No emergent indication for dialysis but will continue to follow closely.  Discontinue Losartan.  Renal dose meds for eGFR <10 mL/min.   Avoid nephrotoxic medications including NSAIDs and  iodinated intravenous contrast exposure unless the latter is absolutely indicated.   Preferred narcotic agents for pain control are hydromorphone, fentanyl, and methadone. Morphine should not be used.  Avoid Baclofen and avoid oral sodium phosphate and magnesium citrate based laxatives / bowel preps.  Continue strict Input and Output monitoring.  Will monitor the patient closely with you and intervene or adjust therapy as indicated by changes in clinical status/labs  Hyperkalemia - due to #1.  Given insulin and D50.  Will dose sodium bicarbonate.  Will start Lokelma and follow. Metabolic acidosis - due to #1.  Start bicarb and follow.  Start bicarbonate 650 mg bid and follow. HTN - stable DM type 2 - continue with home meds and follow PVD - stable COPD - stable Anemia of CKD stage IV - hgb is stable and continue to follow.    Joseph Hebert 02/10/2024, 7:19 PM

## 2024-02-11 DIAGNOSIS — Z993 Dependence on wheelchair: Secondary | ICD-10-CM | POA: Diagnosis not present

## 2024-02-11 DIAGNOSIS — I2510 Atherosclerotic heart disease of native coronary artery without angina pectoris: Secondary | ICD-10-CM | POA: Diagnosis present

## 2024-02-11 DIAGNOSIS — E1151 Type 2 diabetes mellitus with diabetic peripheral angiopathy without gangrene: Secondary | ICD-10-CM | POA: Diagnosis present

## 2024-02-11 DIAGNOSIS — R918 Other nonspecific abnormal finding of lung field: Secondary | ICD-10-CM | POA: Diagnosis not present

## 2024-02-11 DIAGNOSIS — R0603 Acute respiratory distress: Secondary | ICD-10-CM | POA: Diagnosis not present

## 2024-02-11 DIAGNOSIS — Z833 Family history of diabetes mellitus: Secondary | ICD-10-CM | POA: Diagnosis not present

## 2024-02-11 DIAGNOSIS — J9601 Acute respiratory failure with hypoxia: Secondary | ICD-10-CM | POA: Diagnosis present

## 2024-02-11 DIAGNOSIS — E1110 Type 2 diabetes mellitus with ketoacidosis without coma: Secondary | ICD-10-CM | POA: Diagnosis not present

## 2024-02-11 DIAGNOSIS — N179 Acute kidney failure, unspecified: Secondary | ICD-10-CM

## 2024-02-11 DIAGNOSIS — Z992 Dependence on renal dialysis: Secondary | ICD-10-CM | POA: Diagnosis not present

## 2024-02-11 DIAGNOSIS — E119 Type 2 diabetes mellitus without complications: Secondary | ICD-10-CM | POA: Diagnosis not present

## 2024-02-11 DIAGNOSIS — I10 Essential (primary) hypertension: Secondary | ICD-10-CM | POA: Diagnosis not present

## 2024-02-11 DIAGNOSIS — D649 Anemia, unspecified: Secondary | ICD-10-CM | POA: Diagnosis not present

## 2024-02-11 DIAGNOSIS — R34 Anuria and oliguria: Secondary | ICD-10-CM | POA: Diagnosis present

## 2024-02-11 DIAGNOSIS — Z48813 Encounter for surgical aftercare following surgery on the respiratory system: Secondary | ICD-10-CM | POA: Diagnosis not present

## 2024-02-11 DIAGNOSIS — I700 Atherosclerosis of aorta: Secondary | ICD-10-CM | POA: Diagnosis present

## 2024-02-11 DIAGNOSIS — Z79899 Other long term (current) drug therapy: Secondary | ICD-10-CM | POA: Diagnosis not present

## 2024-02-11 DIAGNOSIS — Z4901 Encounter for fitting and adjustment of extracorporeal dialysis catheter: Secondary | ICD-10-CM | POA: Diagnosis not present

## 2024-02-11 DIAGNOSIS — E875 Hyperkalemia: Secondary | ICD-10-CM | POA: Diagnosis present

## 2024-02-11 DIAGNOSIS — E1122 Type 2 diabetes mellitus with diabetic chronic kidney disease: Secondary | ICD-10-CM | POA: Diagnosis present

## 2024-02-11 DIAGNOSIS — J440 Chronic obstructive pulmonary disease with acute lower respiratory infection: Secondary | ICD-10-CM | POA: Diagnosis present

## 2024-02-11 DIAGNOSIS — I120 Hypertensive chronic kidney disease with stage 5 chronic kidney disease or end stage renal disease: Secondary | ICD-10-CM | POA: Diagnosis present

## 2024-02-11 DIAGNOSIS — E8770 Fluid overload, unspecified: Secondary | ICD-10-CM | POA: Diagnosis present

## 2024-02-11 DIAGNOSIS — E785 Hyperlipidemia, unspecified: Secondary | ICD-10-CM | POA: Diagnosis present

## 2024-02-11 DIAGNOSIS — J90 Pleural effusion, not elsewhere classified: Secondary | ICD-10-CM | POA: Diagnosis present

## 2024-02-11 DIAGNOSIS — N184 Chronic kidney disease, stage 4 (severe): Secondary | ICD-10-CM | POA: Diagnosis not present

## 2024-02-11 DIAGNOSIS — Z8249 Family history of ischemic heart disease and other diseases of the circulatory system: Secondary | ICD-10-CM | POA: Diagnosis not present

## 2024-02-11 DIAGNOSIS — S42002A Fracture of unspecified part of left clavicle, initial encounter for closed fracture: Secondary | ICD-10-CM | POA: Diagnosis not present

## 2024-02-11 DIAGNOSIS — J984 Other disorders of lung: Secondary | ICD-10-CM | POA: Diagnosis not present

## 2024-02-11 DIAGNOSIS — F03A0 Unspecified dementia, mild, without behavioral disturbance, psychotic disturbance, mood disturbance, and anxiety: Secondary | ICD-10-CM

## 2024-02-11 DIAGNOSIS — F0390 Unspecified dementia without behavioral disturbance: Secondary | ICD-10-CM | POA: Diagnosis present

## 2024-02-11 DIAGNOSIS — E872 Acidosis, unspecified: Secondary | ICD-10-CM | POA: Diagnosis present

## 2024-02-11 DIAGNOSIS — Z79890 Hormone replacement therapy (postmenopausal): Secondary | ICD-10-CM | POA: Diagnosis not present

## 2024-02-11 DIAGNOSIS — J9811 Atelectasis: Secondary | ICD-10-CM | POA: Diagnosis present

## 2024-02-11 DIAGNOSIS — J189 Pneumonia, unspecified organism: Secondary | ICD-10-CM | POA: Diagnosis not present

## 2024-02-11 DIAGNOSIS — Z9889 Other specified postprocedural states: Secondary | ICD-10-CM | POA: Diagnosis not present

## 2024-02-11 DIAGNOSIS — I129 Hypertensive chronic kidney disease with stage 1 through stage 4 chronic kidney disease, or unspecified chronic kidney disease: Secondary | ICD-10-CM | POA: Diagnosis not present

## 2024-02-11 DIAGNOSIS — D631 Anemia in chronic kidney disease: Secondary | ICD-10-CM | POA: Diagnosis present

## 2024-02-11 DIAGNOSIS — F17210 Nicotine dependence, cigarettes, uncomplicated: Secondary | ICD-10-CM | POA: Diagnosis present

## 2024-02-11 DIAGNOSIS — N185 Chronic kidney disease, stage 5: Secondary | ICD-10-CM | POA: Diagnosis not present

## 2024-02-11 DIAGNOSIS — N186 End stage renal disease: Secondary | ICD-10-CM | POA: Diagnosis present

## 2024-02-11 HISTORY — DX: Acute kidney failure, unspecified: N17.9

## 2024-02-11 LAB — RENAL FUNCTION PANEL
Albumin: 2.9 g/dL — ABNORMAL LOW (ref 3.5–5.0)
Anion gap: 16 — ABNORMAL HIGH (ref 5–15)
BUN: 102 mg/dL — ABNORMAL HIGH (ref 8–23)
CO2: 15 mmol/L — ABNORMAL LOW (ref 22–32)
Calcium: 7.9 mg/dL — ABNORMAL LOW (ref 8.9–10.3)
Chloride: 104 mmol/L (ref 98–111)
Creatinine, Ser: 10.8 mg/dL — ABNORMAL HIGH (ref 0.61–1.24)
GFR, Estimated: 4 mL/min — ABNORMAL LOW (ref >60)
Glucose, Bld: 240 mg/dL — ABNORMAL HIGH (ref 70–99)
Phosphorus: 10.6 mg/dL — ABNORMAL HIGH (ref 2.5–4.6)
Potassium: 5.6 mmol/L — ABNORMAL HIGH (ref 3.5–5.1)
Sodium: 135 mmol/L (ref 135–145)

## 2024-02-11 LAB — CBC
HCT: 28.3 % — ABNORMAL LOW (ref 39.0–52.0)
HCT: 30.3 % — ABNORMAL LOW (ref 39.0–52.0)
Hemoglobin: 8.9 g/dL — ABNORMAL LOW (ref 13.0–17.0)
Hemoglobin: 9.6 g/dL — ABNORMAL LOW (ref 13.0–17.0)
MCH: 29.6 pg (ref 26.0–34.0)
MCH: 29.8 pg (ref 26.0–34.0)
MCHC: 31.4 g/dL (ref 30.0–36.0)
MCHC: 31.7 g/dL (ref 30.0–36.0)
MCV: 94 fL (ref 80.0–100.0)
MCV: 94.1 fL (ref 80.0–100.0)
Platelets: 275 10*3/uL (ref 150–400)
Platelets: 293 10*3/uL (ref 150–400)
RBC: 3.01 MIL/uL — ABNORMAL LOW (ref 4.22–5.81)
RBC: 3.22 MIL/uL — ABNORMAL LOW (ref 4.22–5.81)
RDW: 15.4 % (ref 11.5–15.5)
RDW: 15.5 % (ref 11.5–15.5)
WBC: 8.1 10*3/uL (ref 4.0–10.5)
WBC: 8.3 10*3/uL (ref 4.0–10.5)
nRBC: 0 % (ref 0.0–0.2)
nRBC: 0 % (ref 0.0–0.2)

## 2024-02-11 LAB — URINALYSIS, ROUTINE W REFLEX MICROSCOPIC
Bacteria, UA: NONE SEEN
Bilirubin Urine: NEGATIVE
Glucose, UA: 50 mg/dL — AB
Hgb urine dipstick: NEGATIVE
Ketones, ur: NEGATIVE mg/dL
Leukocytes,Ua: NEGATIVE
Nitrite: NEGATIVE
Protein, ur: >=300 mg/dL — AB
Specific Gravity, Urine: 1.012 (ref 1.005–1.030)
pH: 6 (ref 5.0–8.0)

## 2024-02-11 LAB — COMPREHENSIVE METABOLIC PANEL
ALT: 13 U/L (ref 0–44)
AST: 10 U/L — ABNORMAL LOW (ref 15–41)
Albumin: 2.6 g/dL — ABNORMAL LOW (ref 3.5–5.0)
Alkaline Phosphatase: 61 U/L (ref 38–126)
Anion gap: 18 — ABNORMAL HIGH (ref 5–15)
BUN: 104 mg/dL — ABNORMAL HIGH (ref 8–23)
CO2: 16 mmol/L — ABNORMAL LOW (ref 22–32)
Calcium: 7.5 mg/dL — ABNORMAL LOW (ref 8.9–10.3)
Chloride: 105 mmol/L (ref 98–111)
Creatinine, Ser: 10.3 mg/dL — ABNORMAL HIGH (ref 0.61–1.24)
GFR, Estimated: 5 mL/min — ABNORMAL LOW (ref >60)
Glucose, Bld: 121 mg/dL — ABNORMAL HIGH (ref 70–99)
Potassium: 5.7 mmol/L — ABNORMAL HIGH (ref 3.5–5.1)
Sodium: 139 mmol/L (ref 135–145)
Total Bilirubin: 0.6 mg/dL (ref 0.0–1.2)
Total Protein: 5.5 g/dL — ABNORMAL LOW (ref 6.5–8.1)

## 2024-02-11 LAB — PHOSPHORUS: Phosphorus: 10.5 mg/dL — ABNORMAL HIGH (ref 2.5–4.6)

## 2024-02-11 LAB — SODIUM, URINE, RANDOM: Sodium, Ur: 29 mmol/L

## 2024-02-11 LAB — LAB REPORT - SCANNED: EGFR: 5

## 2024-02-11 LAB — CBG MONITORING, ED: Glucose-Capillary: 128 mg/dL — ABNORMAL HIGH (ref 70–99)

## 2024-02-11 LAB — CREATININE, URINE, RANDOM: Creatinine, Urine: 107 mg/dL

## 2024-02-11 LAB — TROPONIN I (HIGH SENSITIVITY): Troponin I (High Sensitivity): 55 ng/L — ABNORMAL HIGH (ref <18)

## 2024-02-11 LAB — HEPATITIS B SURFACE ANTIGEN: Hepatitis B Surface Ag: NONREACTIVE

## 2024-02-11 MED ORDER — IPRATROPIUM-ALBUTEROL 0.5-2.5 (3) MG/3ML IN SOLN
3.0000 mL | Freq: Four times a day (QID) | RESPIRATORY_TRACT | Status: AC
  Administered 2024-02-11 (×4): 3 mL via RESPIRATORY_TRACT
  Filled 2024-02-11 (×4): qty 3

## 2024-02-11 MED ORDER — ANTICOAGULANT SODIUM CITRATE 4% (200MG/5ML) IV SOLN: 5.0000 mL | Status: DC | PRN

## 2024-02-11 MED ORDER — SODIUM ZIRCONIUM CYCLOSILICATE 10 G PO PACK
10.0000 g | PACK | Freq: Three times a day (TID) | ORAL | Status: AC
  Administered 2024-02-11 (×3): 10 g via ORAL
  Filled 2024-02-11 (×3): qty 1

## 2024-02-11 MED ORDER — HYDRALAZINE HCL 25 MG PO TABS
25.0000 mg | ORAL_TABLET | Freq: Two times a day (BID) | ORAL | Status: DC
  Administered 2024-02-11 – 2024-02-15 (×8): 25 mg via ORAL
  Filled 2024-02-11 (×10): qty 1

## 2024-02-11 MED ORDER — ONDANSETRON HCL 4 MG PO TABS: 4.0000 mg | ORAL_TABLET | Freq: Four times a day (QID) | ORAL | Status: DC | PRN

## 2024-02-11 MED ORDER — NICOTINE 7 MG/24HR TD PT24
7.0000 mg | MEDICATED_PATCH | Freq: Every day | TRANSDERMAL | Status: DC
  Administered 2024-02-11 – 2024-02-16 (×5): 7 mg via TRANSDERMAL
  Filled 2024-02-11 (×6): qty 1

## 2024-02-11 MED ORDER — ONDANSETRON HCL 4 MG/2ML IJ SOLN: 4.0000 mg | Freq: Four times a day (QID) | INTRAMUSCULAR | Status: DC | PRN

## 2024-02-11 MED ORDER — LIDOCAINE-PRILOCAINE 2.5-2.5 % EX CREA: 1.0000 | TOPICAL_CREAM | CUTANEOUS | Status: DC | PRN

## 2024-02-11 MED ORDER — HEPARIN SODIUM (PORCINE) 5000 UNIT/ML IJ SOLN
5000.0000 [IU] | Freq: Three times a day (TID) | INTRAMUSCULAR | Status: DC
  Administered 2024-02-11 – 2024-02-16 (×12): 5000 [IU] via SUBCUTANEOUS
  Filled 2024-02-11 (×13): qty 1

## 2024-02-11 MED ORDER — PREDNISONE 20 MG PO TABS: 40.0000 mg | ORAL_TABLET | Freq: Every day | ORAL | Status: DC

## 2024-02-11 MED ORDER — ALBUTEROL SULFATE (2.5 MG/3ML) 0.083% IN NEBU
2.5000 mg | INHALATION_SOLUTION | RESPIRATORY_TRACT | Status: DC | PRN
  Administered 2024-02-12 – 2024-02-14 (×2): 2.5 mg via RESPIRATORY_TRACT
  Filled 2024-02-11 (×2): qty 3

## 2024-02-11 MED ORDER — CHLORHEXIDINE GLUCONATE CLOTH 2 % EX PADS
6.0000 | MEDICATED_PAD | Freq: Every day | CUTANEOUS | Status: DC
  Administered 2024-02-12 – 2024-02-16 (×5): 6 via TOPICAL

## 2024-02-11 MED ORDER — GUAIFENESIN 100 MG/5ML PO LIQD: 5.0000 mL | ORAL | Status: DC | PRN

## 2024-02-11 MED ORDER — HEPARIN SODIUM (PORCINE) 1000 UNIT/ML DIALYSIS
1000.0000 [IU] | INTRAMUSCULAR | Status: DC | PRN
  Administered 2024-02-12: 1000 [IU]
  Filled 2024-02-11: qty 1

## 2024-02-11 MED ORDER — NITROGLYCERIN 0.4 MG SL SUBL
0.4000 mg | SUBLINGUAL_TABLET | SUBLINGUAL | Status: DC | PRN
  Administered 2024-02-11 – 2024-02-12 (×2): 0.4 mg via SUBLINGUAL
  Filled 2024-02-11 (×2): qty 1

## 2024-02-11 MED ORDER — LEVOTHYROXINE SODIUM 75 MCG PO TABS
75.0000 ug | ORAL_TABLET | Freq: Every day | ORAL | Status: DC
  Administered 2024-02-11 – 2024-02-16 (×4): 75 ug via ORAL
  Filled 2024-02-11 (×5): qty 1

## 2024-02-11 MED ORDER — PENTAFLUOROPROP-TETRAFLUOROETH EX AERO: 1.0000 | INHALATION_SPRAY | CUTANEOUS | Status: DC | PRN

## 2024-02-11 MED ORDER — AMLODIPINE BESYLATE 10 MG PO TABS
10.0000 mg | ORAL_TABLET | Freq: Every day | ORAL | Status: DC
  Administered 2024-02-11 – 2024-02-15 (×5): 10 mg via ORAL
  Filled 2024-02-11 (×2): qty 1
  Filled 2024-02-11: qty 2
  Filled 2024-02-11 (×2): qty 1

## 2024-02-11 MED ORDER — SORBITOL 70 % SOLN: 30.0000 mL | Freq: Every day | Status: DC | PRN

## 2024-02-11 MED ORDER — CEFAZOLIN SODIUM-DEXTROSE 2-4 GM/100ML-% IV SOLN
2.0000 g | Freq: Once | INTRAVENOUS | Status: AC
  Administered 2024-02-12: 2 g via INTRAVENOUS

## 2024-02-11 MED ORDER — METHYLPREDNISOLONE SODIUM SUCC 125 MG IJ SOLR
60.0000 mg | Freq: Once | INTRAMUSCULAR | Status: AC
  Administered 2024-02-11: 60 mg via INTRAVENOUS
  Filled 2024-02-11: qty 2

## 2024-02-11 MED ORDER — MEMANTINE HCL 10 MG PO TABS
10.0000 mg | ORAL_TABLET | Freq: Two times a day (BID) | ORAL | Status: DC
  Administered 2024-02-11 – 2024-02-16 (×11): 10 mg via ORAL
  Filled 2024-02-11 (×11): qty 1

## 2024-02-11 MED ORDER — ALTEPLASE 2 MG IJ SOLR: 2.0000 mg | Freq: Once | INTRAMUSCULAR | Status: DC | PRN

## 2024-02-11 MED ORDER — LIDOCAINE HCL (PF) 1 % IJ SOLN: 5.0000 mL | INTRAMUSCULAR | Status: DC | PRN

## 2024-02-11 NOTE — Progress Notes (Signed)
 [facility] Progress Note    Assessment/ Plan:     AKI/CKD stage IV - unclear if this is progression of his CKD or AKI related to poor po intake. Ruled out obstruction with CT renal stone study.  Losartan d/c'ed. Followed by Dr. Arrie Aran as an outpatient Cr only down to 10.3, BUN unchanged. Suspecting this is more so progression of his CKD and is now ESRD. Has what sounds like uremic symptoms in the last few weeks prior to admit. Will keep him NPO after midnight and will consult IR for [facility] placement. Will tentatively plan for IHD#1 tomorrow, slow start protocol. Patient agrees with plan Avoid nephrotoxic medications including NSAIDs and iodinated intravenous contrast exposure unless the latter is absolutely indicated.  Preferred narcotic agents for pain control are hydromorphone, fentanyl, and methadone. Morphine should not be used. Avoid Baclofen and avoid oral sodium phosphate and magnesium citrate based laxatives / bowel preps. Continue strict Input and Output monitoring. Will monitor the patient closely with you and intervene or adjust therapy as indicated by changes in clinical status/labs  Hyperkalemia - due to #1.  Given insulin and D50, lokelma, and sodium bicarb. K 5.7 this am. Ordered lokelma 10g TID x 1 day, HD plan as above Metabolic acidosis - due to #1.  Start bicarb and follow.  Start bicarbonate 650 mg bid and follow. HD plan as above, can d/c nahco3 once stable on HD HTN - stable DM type 2 - per primary PVD - stable COPD - receiving neds and prednisone. Recommend checking CXR at least. Per primary Anemia of CKD stage IV - hgb from 11 to 8.9 this AM. Recommend work up-will defer to primary service. Will check Fe panel to assess for Fe vs ESA needs  Subjective:   Patient seen and examined in ER. Had some SOB/wheezing overnight but otherwise no complaints currently.   Objective:   BP (!) 147/55   Pulse 77   Temp 98 F (36.7 C) (Oral)   Resp 13   Ht 5\' 10"   (1.778 m)   Wt 82.1 kg   SpO2 92%   BMI 25.97 kg/m   Intake/Output Summary (Last 24 hours) at 02/11/2024 1054 Last data filed at 02/11/2024 0530 Gross per 24 hour  Intake 500 ml  Output 250 ml  Net 250 ml   Weight change:   Physical Exam: Gen: NAD CVS: RRR Resp: diffuse wheezing, normal wob, no overt crackles/rales appreciated Abd: soft, nt/nd Ext: trace pitting edema b/l Les Neuro: awake, alert  Imaging: CT Renal Stone Study Result Date: 02/10/2024 CLINICAL DATA:  Flank pain, abnormal labs EXAM: CT ABDOMEN AND PELVIS WITHOUT CONTRAST TECHNIQUE: Multidetector CT imaging of the abdomen and pelvis was performed following the standard protocol without IV contrast. RADIATION DOSE REDUCTION: This exam was performed according to the departmental dose-optimization program which includes automated exposure control, adjustment of the mA and/or kV according to patient size and/or use of iterative reconstruction technique. COMPARISON:  01/26/2017 FINDINGS: Lower chest: Small bilateral pleural effusions. Associated compressive atelectasis in the bilateral lower lobes. Hepatobiliary: Scattered hepatic cysts measuring up to 2.9 cm in the posterior right hepatic lobe (series 2/image 14). Gallbladder is notable for tiny layering gallstones (series 2/image 22), without associated inflammatory changes. No intrahepatic or extrahepatic ductal dilatation. Pancreas: Within normal limits. Spleen: Within normal limits. Adrenals/Urinary Tract: Low-density 1.7 cm renal nodule, measuring less than 20 HUs, indeterminate but likely reflecting a benign adrenal adenoma. Right adrenal gland is within normal limits. Numerous simple bilateral renal  cysts, measuring up to 4.2 cm in the lateral left upper kidney (series 2/image 28), benign (Bosniak I). No follow-up is recommended. 15 mm hyperdense lesion along the anterior right upper kidney, measuring chest under 70 HUs, technically indeterminate but unchanged from 2018,  compatible with a benign hemorrhagic cyst (Bosniak II). No follow-up is recommended. No hydronephrosis. Bladder is within normal limits. Stomach/Bowel: Stomach is within normal limits. No evidence of bowel obstruction. Normal appendix (series 2/image 40). Mild left colonic diverticulosis, without evidence of diverticulitis. Vascular/Lymphatic: Aorta bi-iliac stents. Atherosclerotic calcifications of the abdominal aorta and branch vessels. Excluded 1.8 cm right common iliac artery aneurysm (series 2/image 56). No suspicious abdominopelvic lymphadenopathy. Reproductive: Mild prostatomegaly with dystrophic calcifications. Other: No abdominopelvic ascites. Musculoskeletal: Status post PLIF at L3-S1. Anterior lumbar fixation at L5-S1. IMPRESSION: Cholelithiasis, without associated inflammatory changes. Mild left colonic diverticulosis, without evidence of diverticulitis. Small bilateral pleural effusions. Additional ancillary findings as above. Aortic Atherosclerosis (ICD10-I70.0). Electronically Signed   By: Charline Bills M.D.   On: 02/10/2024 23:44    Labs: BMET Recent Labs  Lab 02/10/24 1920 02/11/24 0522  NA 136 139  K 5.9* 5.7*  CL 103 105  CO2 14* 16*  GLUCOSE 126* 121*  BUN 104* 104*  CREATININE 11.09* 10.30*  CALCIUM 8.3* 7.5*  PHOS  --  10.5*   CBC Recent Labs  Lab 02/10/24 1920 02/11/24 0522  WBC 8.7 8.1  HGB 11.0* 8.9*  HCT 35.4* 28.3*  MCV 95.2 94.0  PLT 328 275    Medications:     amLODipine  10 mg Oral QHS   heparin  5,000 Units Subcutaneous Q8H   hydrALAZINE  25 mg Oral BID   ipratropium-albuterol  3 mL Nebulization QID   levothyroxine  75 mcg Oral Daily   memantine  10 mg Oral BID   nicotine  7 mg Transdermal Daily   [START ON 02/12/2024] predniSONE  40 mg Oral Q breakfast   sodium bicarbonate  650 mg Oral BID   sodium zirconium cyclosilicate  10 g Oral TID      Anthony Sar, MD [facility] 02/11/2024, 10:54 AM

## 2024-02-11 NOTE — Care Management Note (Addendum)
 Case Management Note  Patient Details  Name: Joseph Hebert. MRN: [id number] Date of Birth: [birth year]  Subjective/Objective:                  Pt from home with spouse; sent by kidney center for acute renal failure.  Action/Plan: NPO after midnight and will consult IR for [name] placement. Will tentatively plan for IHD#1 tomorrow, slow start protocol.      Oletta Cohn, RN 02/11/2024, 12:12 PM

## 2024-02-11 NOTE — ED Notes (Signed)
 Patient reporting chest pain and shortness of breath when he woke up from sleep @0630 . EKG done. Dr. Joneen Roach made aware.

## 2024-02-11 NOTE — Progress Notes (Signed)
  PROGRESS NOTE  Patient admitted earlier this morning. See H&P.   Patient is a 78 year old male who presented to the hospital after getting outpatient blood work done which revealed creatinine of 11 and potassium of 5.9.  Nephrology was consulted.  Patient was seen in the emergency department this morning.  His biggest complaint was food, asking for a cheeseburger.  Nephrology planning for [facility] placement and dialysis tomorrow. Lokelma for hyperkalemia  Status is: Inpatient Remains inpatient appropriate because: Starting dialysis   Noralee Stain, DO [facility] 02/11/2024, 1:54 PM  Available via Epic secure chat 7am-7pm After these hours, please refer to coverage provider listed on [email or web address]

## 2024-02-11 NOTE — H&P (Signed)
 History and Physical    Patient: Joseph Hebert. MVH:[id number] DOB: 01/11/1946 DOA: 02/10/2024 DOS: the patient was seen and examined on 02/11/2024 PCP: Paulina Fusi, MD  Patient coming from: Home  Chief Complaint:  Chief Complaint  Patient presents with   Abnormal Lab   HPI: Joseph Hebert. is a 78 y.o. male with medical history significant for DM which does not require medication, htn and ESRD.  The patient does not know what his baseline creatinine is but he tells me that the nephrologist have been talking to him about the possibility of hemodialysis for his last couple of visits he had a routine visit today and had blood work.  He says he went home and was called on the phone and told to come to the emergency room right away.  Patient has been unable to urinate all day today despite trying.  He denies any chest pain or shortness of breath or fevers or chills.  In the emergency department the patient's blood work revealed a creatinine of 11 and a potassium of 5.9.  The bladder scan revealed only 250 mL of urine and the patient was unable to urinate.  Urology was called from the emergency department.  He will be admitted likely will require initiation of HD during this hospital stay.  Review of Systems: As mentioned in the history of present illness. All other systems reviewed and are negative. Past Medical History:  Diagnosis Date   Acute encephalopathy    Acute hypoxemic respiratory failure (HCC)    Acute pulmonary edema (HCC)    Acute respiratory failure with hypoxia [facility])    Aftercare following surgery of the circulatory system, NEC 01/01/2014   Anemia    Arrhythmia    takes metoprolol daily   Arthritis    Asthma    Bilateral leg pain 06/16/2015   CAD (coronary artery disease)    Cardiac asystole (HCC)    Cardiogenic shock (HCC)    Carotid artery occlusion    with Claudication   Chronic back pain    stenosis   Chronic kidney disease    CKD (chronic kidney disease)  stage 3, GFR 30-59 ml/min (HCC) 07/18/2015   COPD (chronic obstructive pulmonary disease) (HCC)    Spirva daily and Albuterol as needed   Dementia (HCC)    Depression    Diabetes mellitus without complication (HCC)    Type 2 diet controlled.Never been on meds   GERD (gastroesophageal reflux disease)    Gout    takes Uloric daily   HCAP (healthcare-associated pneumonia)    Head injury    as a child   History of colon polyps    benign   History of gastric ulcer    History of hiatal hernia    Hyperlipidemia    takes Atorvastatin and Fenofibrate daily   Hypertension    takes Lisinopril,Amlodipine,and Hydralazine daily   Impaired memory    takes Namenda daily   Intermittent claudication (HCC) 07/18/2015   Overview:  Operative management: The patient will be scheduled for a left femoral to  popliteal bypass in the near future after cardiac risk stratification. He  does have some superficial femoral and tibial artery occlusive disease in  the right leg. However his ABI on the right side is greater than 0.9.  Hopefully he will improve with just a walking program the right leg alone.  If not we could c   Lumbosacral spondylosis with radiculopathy 02/17/2016   Nevus of choroid of  left eye 12/08/2014   Numbness    lower left leg and upper right thigh   Occlusion and stenosis of carotid artery without mention of cerebral infarction 01/01/2013   PAD (peripheral artery disease) (HCC)    Pneumonia    hx of-couple of yrs ago   Preoperative cardiovascular examination 07/17/2015   Overview:  Echo 2013 with EF 55-60% Lexiscan MPS 11/12/12 with normal perfusion and function  Overview:  Echo 2013 with EF 55-60% Lexiscan MPS 11/12/12 with normal perfusion and function   Serous retinal detachment of left eye 12/08/2014   Shortness of breath dyspnea    with exertion   Status post intraocular lens implant 12/08/2014   Tuberculosis 2006    9 months    Vertigo    Wears glasses    Past Surgical History:   Procedure Laterality Date   ABDOMINAL AORTOGRAM W/LOWER EXTREMITY N/A 02/20/2019   Procedure: ABDOMINAL AORTOGRAM W/LOWER EXTREMITY;  Surgeon: Sherren Kerns, MD;  Location: [facility] INVASIVE CV LAB;  Service: Cardiovascular;  Laterality: N/A;   ABDOMINAL EXPOSURE N/A 01/22/2017   Procedure: ABDOMINAL EXPOSURE;  Surgeon: Maeola Harman, MD;  Location: [facility] OR;  Service: Vascular;  Laterality: N/A;   ANTERIOR LAT LUMBAR FUSION N/A 01/22/2017   Procedure: LUMBAR THREE-FOUR, LUMBAR FOUR-FIVE ANTERIOR LATERAL INTERBODY FUSION;  Surgeon: Loura Halt Ditty, MD;  Location: [facility] OR;  Service: Neurosurgery;  Laterality: N/A;  L3-4 L4-5 Lateral interbody fusion   ANTERIOR LUMBAR FUSION N/A 01/22/2017   Procedure: LUMBAR FIVE-SACRUM ONE ANTERIOR LUMBAR INTERBODY FUSION WITH ABDOMINAL APPROACH BY DR Randie Heinz;  Surgeon: Loura Halt Ditty, MD;  Location: [facility] OR;  Service: Neurosurgery;  Laterality: N/A;   CAROTID BODY TUMOR EXCISION Left 09/21/2015   Procedure: EXCISE LEFT NECK NODULE WITH LOCAL ;  Surgeon: Sherren Kerns, MD;  Location: [facility] OR;  Service: Vascular;  Laterality: Left;   CAROTID ENDARTERECTOMY  Nov. 15,2011   LEFT cea   cataract surgery Bilateral    COLONOSCOPY     EYE SURGERY     FEMORAL-POPLITEAL BYPASS GRAFT Left 08/23/2015   Procedure: LEFT FEMORAL-POPLITEAL ARTERY BYPASS WITH GORETEX GRAFT;  Surgeon: Sherren Kerns, MD;  Location: [facility] OR;  Service: Vascular;  Laterality: Left;   FEMORAL-POPLITEAL BYPASS GRAFT Left 02/23/2019   Procedure: REDO LEFT FEMORAL TO POPLITEAL ARTERY BYPASS GRAFT Using Propaten graft;  Surgeon: Sherren Kerns, MD;  Location: [facility] OR;  Service: Vascular;  Laterality: Left;   JOINT REPLACEMENT  1980   RIGHT  knee   LUMBAR EPIDURAL INJECTION     LUMBAR LAMINECTOMY/DECOMPRESSION MICRODISCECTOMY Right 02/17/2016   Procedure: Laminectomy and Foraminotomy - Lumbar four-five right;  Surgeon: Loura Halt Ditty, MD;  Location: [facility] NEURO ORS;  Service: Neurosurgery;   Laterality: Right;  right   LUMBAR PERCUTANEOUS PEDICLE SCREW 3 LEVEL N/A 01/22/2017   Procedure: LUMBAR THREE-SACRAL ONE PERCUTANEOUS PEDICLE SCREW FIXATION WITH ROBOTIC ASSISTANCE;  Surgeon: Loura Halt Ditty, MD;  Location: [facility] OR;  Service: Neurosurgery;  Laterality: N/A;  L3 to S1 Percutaneous pedicle screw fixation   PERIPHERAL VASCULAR CATHETERIZATION N/A 06/24/2015   Procedure: Abdominal Aortogram;  Surgeon: Sherren Kerns, MD;  Location: [facility] INVASIVE CV LAB;  Service: Cardiovascular;  Laterality: N/A;   Stents  Aug.  23, 1999   Bilateral iliofemoral  stents, [facility]   TONSILLECTOMY     Social History:  reports that he has been smoking cigarettes. He has never used smokeless tobacco. He reports that he does not drink alcohol and does not use  drugs.  Allergies  Allergen Reactions   Lopressor [Metoprolol Tartrate] Other (See Comments)    Severe bradycardia    Family History  Problem Relation Age of Onset   Diabetes Mother    Hyperlipidemia Father    Hypertension Father    Other Father        Right Leg Amputation   Heart disease Sister        Aneyrism     Prior to Admission medications   Medication Sig Start Date End Date Taking? Authorizing Provider  amLODipine (NORVASC) 10 MG tablet Take 10 mg by mouth at bedtime. (2100)   Yes [provider]  aspirin EC 81 MG tablet Take 81 mg by mouth daily. Swallow whole.   Yes [provider]  atorvastatin (LIPITOR) 80 MG tablet Take 80 mg by mouth at bedtime. (2100)   Yes [provider]  Febuxostat 80 MG TABS Take 1 tablet by mouth daily.   Yes [provider]  gabapentin (NEURONTIN) 100 MG capsule Take 1 capsule by mouth at bedtime. 10/29/23  Yes [provider]  hydrALAZINE (APRESOLINE) 25 MG tablet Take 1 tablet by mouth 2 (two) times daily. 10/29/23  Yes [provider]  levothyroxine (SYNTHROID) 75 MCG tablet Take 75 mcg by mouth daily. 02/06/24  Yes [provider]   memantine (NAMENDA) 10 MG tablet Take 1 tablet by mouth 2 (two) times daily. 10/29/23  Yes [provider]  sodium bicarbonate 650 MG tablet Take 650 mg by mouth 2 (two) times daily. 01/09/24  Yes [provider]  losartan (COZAAR) 100 MG tablet Take 100 mg by mouth daily.  Patient not taking: Reported on 02/10/2024 03/16/19   [provider]    Physical Exam: Vitals:   02/10/24 1903 02/10/24 1907 02/10/24 2058 02/10/24 2325  BP:  134/63  131/62  Pulse:  70  75  Resp:  19  20  Temp:  97.8 F (36.6 C)  (!) 96.7 F (35.9 C)  TempSrc:    Oral  SpO2:  96% 99% 94%  Weight: 82.1 kg     Height: 5\' 10"  (1.778 m)      Physical Exam:  General: No acute distress, well developed, well nourished HEENT: Normocephalic, atraumatic, PERRL Cardiovascular: Normal rate and rhythm. Distal pulses intact. Pulmonary: Normal pulmonary effort, normal breath sounds Gastrointestinal: Nondistended abdomen, soft, non-tender, normoactive bowel sounds, no organomegaly Musculoskeletal:Normal ROM, 1+ lower ext edema Skin: Skin is warm and dry. Neuro: No focal deficits noted, AAOx3. PSYCH: Attentive and cooperative  Data Reviewed:  Results for orders placed or performed during the hospital encounter of 02/10/24 (from the past 24 hours)  Lipase, blood     Status: None   Collection Time: 02/10/24  7:20 PM  Result Value Ref Range   Lipase 51 11 - 51 U/L  Comprehensive metabolic panel     Status: Abnormal   Collection Time: 02/10/24  7:20 PM  Result Value Ref Range   Sodium 136 135 - 145 mmol/L   Potassium 5.9 (H) 3.5 - 5.1 mmol/L   Chloride 103 98 - 111 mmol/L   CO2 14 (L) 22 - 32 mmol/L   Glucose, Bld 126 (H) 70 - 99 mg/dL   BUN 161 (H) 8 - 23 mg/dL   Creatinine, Ser 09.60 (H) 0.61 - 1.24 mg/dL   Calcium 8.3 (L) 8.9 - 10.3 mg/dL   Total Protein 6.8 6.5 - 8.1 g/dL   Albumin 3.3 (L) 3.5 - 5.0 g/dL   AST  14 (L) 15 - 41 U/L   ALT 13 0 - 44 U/L   Alkaline Phosphatase 80 38 - 126  U/L   Total Bilirubin 0.4 0.0 - 1.2 mg/dL   GFR, Estimated 4 (L) >60 mL/min   Anion gap 19 (H) 5 - 15  CBC     Status: Abnormal   Collection Time: 02/10/24  7:20 PM  Result Value Ref Range   WBC 8.7 4.0 - 10.5 K/uL   RBC 3.72 (L) 4.22 - 5.81 MIL/uL   Hemoglobin 11.0 (L) 13.0 - 17.0 g/dL   HCT 16.1 (L) 09.6 - 04.5 %   MCV 95.2 80.0 - 100.0 fL   MCH 29.6 26.0 - 34.0 pg   MCHC 31.1 30.0 - 36.0 g/dL   RDW 40.9 (H) 81.1 - 91.4 %   Platelets 328 150 - 400 K/uL   nRBC 0.0 0.0 - 0.2 %     Assessment and Plan: Acute on chronic renal failure now with mild hyperkalemia - Neurology has been consulted - Lokelma and bicarb and IV fluids recommended for tonight - No nephrotoxic agents  2. Anuric - Foley to be placed  3. Htn -we will continue his antihypertensives but will hold all other nonessential medications.    Advance Care Planning:   Code Status: Full Code the patient names his wife as a surrogate decision maker and wants to be full code.  Consults: Nephrology  Family Communication: None  Severity of Illness: The appropriate patient status for this patient is INPATIENT. Inpatient status is judged to be reasonable and necessary in order to provide the required intensity of service to ensure the patient's safety. The patient's presenting symptoms, physical exam findings, and initial radiographic and laboratory data in the context of their chronic comorbidities is felt to place them at high risk for further clinical deterioration. Furthermore, it is not anticipated that the patient will be medically stable for discharge from the hospital within 2 midnights of admission.   * I certify that at the point of admission it is my clinical judgment that the patient will require inpatient hospital care spanning beyond 2 midnights from the point of admission due to high intensity of service, high risk for further deterioration and high frequency of surveillance required.*  Author: Buena Irish, MD 02/11/2024 1:35 AM  For on call review [email or web address].

## 2024-02-11 NOTE — Progress Notes (Addendum)
 Pt seen. On exam patient w/ generalized wheezing and coughing. Solumedrol and nebulizers ordered. EKG reviewed no acute ischemic changes. Troponin x1 ordered SL nitro ordered PRN

## 2024-02-11 NOTE — Consult Note (Signed)
 Chief Complaint: Patient was seen in consultation today for acute renal failure  at the request of Anthony Sar MD  Referring Physician(s): Anthony Sar, MD  Supervising Physician: Gilmer Mor  Patient Status: [facility] - In-pt  Full Code  History of Present Illness: Joseph Wymer. is a 78 y.o. male with history of COPD, type 2 diabetes, PAD- s/p stents, carotid artery occlusion-s/p CEA, DDD, previous PEA arrest, GERD, HTN, and ESRD. Patient presented to the ER 02/11/24 after his nephrologist called him and  told him to present to the ER. Labwork 02/11/24 reports a cr of 10.3, BUN 104, and GFR of 5. Nephrology was consulted and recommended hemodialysis. IR was consulted to assist with HD catheter placement.   Patient has been having difficulty urinating. Patient denies: chest pain, shortness of breath, nausea, vomiting, diarrhea, fever, and/or abdominal pain.  Past Medical History:  Diagnosis Date   Acute encephalopathy    Acute hypoxemic respiratory failure (HCC)    Acute pulmonary edema (HCC)    Acute respiratory failure with hypoxia [facility])    Aftercare following surgery of the circulatory system, NEC 01/01/2014   Anemia    Arrhythmia    takes metoprolol daily   Arthritis    Asthma    Bilateral leg pain 06/16/2015   CAD (coronary artery disease)    Cardiac asystole (HCC)    Cardiogenic shock (HCC)    Carotid artery occlusion    with Claudication   Chronic back pain    stenosis   Chronic kidney disease    CKD (chronic kidney disease) stage 3, GFR 30-59 ml/min (HCC) 07/18/2015   COPD (chronic obstructive pulmonary disease) (HCC)    Spirva daily and Albuterol as needed   Dementia (HCC)    Depression    Diabetes mellitus without complication (HCC)    Type 2 diet controlled.Never been on meds   GERD (gastroesophageal reflux disease)    Gout    takes Uloric daily   HCAP (healthcare-associated pneumonia)    Head injury    as a child   History of colon polyps    benign    History of gastric ulcer    History of hiatal hernia    Hyperlipidemia    takes Atorvastatin and Fenofibrate daily   Hypertension    takes Lisinopril,Amlodipine,and Hydralazine daily   Impaired memory    takes Namenda daily   Intermittent claudication (HCC) 07/18/2015   Overview:  Operative management: The patient will be scheduled for a left femoral to  popliteal bypass in the near future after cardiac risk stratification. He  does have some superficial femoral and tibial artery occlusive disease in  the right leg. However his ABI on the right side is greater than 0.9.  Hopefully he will improve with just a walking program the right leg alone.  If not we could c   Lumbosacral spondylosis with radiculopathy 02/17/2016   Nevus of choroid of left eye 12/08/2014   Numbness    lower left leg and upper right thigh   Occlusion and stenosis of carotid artery without mention of cerebral infarction 01/01/2013   PAD (peripheral artery disease) (HCC)    Pneumonia    hx of-couple of yrs ago   Preoperative cardiovascular examination 07/17/2015   Overview:  Echo 2013 with EF 55-60% Lexiscan MPS 11/12/12 with normal perfusion and function  Overview:  Echo 2013 with EF 55-60% Lexiscan MPS 11/12/12 with normal perfusion and function   Serous retinal detachment of left eye 12/08/2014  Shortness of breath dyspnea    with exertion   Status post intraocular lens implant 12/08/2014   Tuberculosis 2006    9 months    Vertigo    Wears glasses     Past Surgical History:  Procedure Laterality Date   ABDOMINAL AORTOGRAM W/LOWER EXTREMITY N/A 02/20/2019   Procedure: ABDOMINAL AORTOGRAM W/LOWER EXTREMITY;  Surgeon: Sherren Kerns, MD;  Location: [facility] INVASIVE CV LAB;  Service: Cardiovascular;  Laterality: N/A;   ABDOMINAL EXPOSURE N/A 01/22/2017   Procedure: ABDOMINAL EXPOSURE;  Surgeon: Maeola Harman, MD;  Location: [facility] OR;  Service: Vascular;  Laterality: N/A;   ANTERIOR LAT LUMBAR FUSION N/A 01/22/2017    Procedure: LUMBAR THREE-FOUR, LUMBAR FOUR-FIVE ANTERIOR LATERAL INTERBODY FUSION;  Surgeon: Loura Halt Ditty, MD;  Location: [facility] OR;  Service: Neurosurgery;  Laterality: N/A;  L3-4 L4-5 Lateral interbody fusion   ANTERIOR LUMBAR FUSION N/A 01/22/2017   Procedure: LUMBAR FIVE-SACRUM ONE ANTERIOR LUMBAR INTERBODY FUSION WITH ABDOMINAL APPROACH BY DR Randie Heinz;  Surgeon: Loura Halt Ditty, MD;  Location: [facility] OR;  Service: Neurosurgery;  Laterality: N/A;   CAROTID BODY TUMOR EXCISION Left 09/21/2015   Procedure: EXCISE LEFT NECK NODULE WITH LOCAL ;  Surgeon: Sherren Kerns, MD;  Location: [facility] OR;  Service: Vascular;  Laterality: Left;   CAROTID ENDARTERECTOMY  Nov. 15,2011   LEFT cea   cataract surgery Bilateral    COLONOSCOPY     EYE SURGERY     FEMORAL-POPLITEAL BYPASS GRAFT Left 08/23/2015   Procedure: LEFT FEMORAL-POPLITEAL ARTERY BYPASS WITH GORETEX GRAFT;  Surgeon: Sherren Kerns, MD;  Location: [facility] OR;  Service: Vascular;  Laterality: Left;   FEMORAL-POPLITEAL BYPASS GRAFT Left 02/23/2019   Procedure: REDO LEFT FEMORAL TO POPLITEAL ARTERY BYPASS GRAFT Using Propaten graft;  Surgeon: Sherren Kerns, MD;  Location: [facility] OR;  Service: Vascular;  Laterality: Left;   JOINT REPLACEMENT  1980   RIGHT  knee   LUMBAR EPIDURAL INJECTION     LUMBAR LAMINECTOMY/DECOMPRESSION MICRODISCECTOMY Right 02/17/2016   Procedure: Laminectomy and Foraminotomy - Lumbar four-five right;  Surgeon: Loura Halt Ditty, MD;  Location: [facility] NEURO ORS;  Service: Neurosurgery;  Laterality: Right;  right   LUMBAR PERCUTANEOUS PEDICLE SCREW 3 LEVEL N/A 01/22/2017   Procedure: LUMBAR THREE-SACRAL ONE PERCUTANEOUS PEDICLE SCREW FIXATION WITH ROBOTIC ASSISTANCE;  Surgeon: Loura Halt Ditty, MD;  Location: [facility] OR;  Service: Neurosurgery;  Laterality: N/A;  L3 to S1 Percutaneous pedicle screw fixation   PERIPHERAL VASCULAR CATHETERIZATION N/A 06/24/2015   Procedure: Abdominal Aortogram;  Surgeon: Sherren Kerns, MD;  Location: [facility]  INVASIVE CV LAB;  Service: Cardiovascular;  Laterality: N/A;   Stents  Aug.  23, 1999   Bilateral iliofemoral  stents, [facility]   TONSILLECTOMY      Allergies: Lopressor [metoprolol tartrate]  Medications: Prior to Admission medications   Medication Sig Start Date End Date Taking? Authorizing Provider  amLODipine (NORVASC) 10 MG tablet Take 10 mg by mouth at bedtime. (2100)   Yes [provider]  aspirin EC 81 MG tablet Take 81 mg by mouth daily. Swallow whole.   Yes [provider]  atorvastatin (LIPITOR) 80 MG tablet Take 80 mg by mouth at bedtime. (2100)   Yes [provider]  Febuxostat 80 MG TABS Take 1 tablet by mouth daily.   Yes [provider]  gabapentin (NEURONTIN) 100 MG capsule Take 1 capsule by mouth at bedtime. 10/29/23  Yes [provider]  hydrALAZINE (APRESOLINE) 25 MG tablet Take 1  tablet by mouth 2 (two) times daily. 10/29/23  Yes [provider]  levothyroxine (SYNTHROID) 75 MCG tablet Take 75 mcg by mouth daily. 02/06/24  Yes [provider]  memantine (NAMENDA) 10 MG tablet Take 1 tablet by mouth 2 (two) times daily. 10/29/23  Yes [provider]  sodium bicarbonate 650 MG tablet Take 650 mg by mouth 2 (two) times daily. 01/09/24  Yes [provider]  losartan (COZAAR) 100 MG tablet Take 100 mg by mouth daily.  Patient not taking: Reported on 02/10/2024 03/16/19   [provider]     Family History  Problem Relation Age of Onset   Diabetes Mother    Hyperlipidemia Father    Hypertension Father    Other Father        Right Leg Amputation   Heart disease Sister        Aneyrism     Social History   Socioeconomic History   Marital status: Married    Spouse name: Not on file   Number of children: Not on file   Years of education: Not on file   Highest education level: Not on file  Occupational History   Not on file  Tobacco Use   Smoking status: Every Day    Current  packs/day: 1.00    Types: Cigarettes   Smokeless tobacco: Never  Vaping Use   Vaping status: Never Used  Substance and Sexual Activity   Alcohol use: No    Alcohol/week: 0.0 standard drinks of alcohol   Drug use: No   Sexual activity: Yes  Other Topics Concern   Not on file  Social History Narrative   Not on file   Social Drivers of Health   Financial Resource Strain: Not on file  Food Insecurity: No Food Insecurity (02/11/2024)   Hunger Vital Sign    Worried About Running Out of Food in the Last Year: Never true    Ran Out of Food in the Last Year: Never true  Transportation Needs: No Transportation Needs (02/11/2024)   PRAPARE - Administrator, Civil Service (Medical): No    Lack of Transportation (Non-Medical): No  Physical Activity: Not on file  Stress: Not on file  Social Connections: Moderately Integrated (02/11/2024)   Social Connection and Isolation Panel [NHANES]    Frequency of Communication with Friends and Family: More than three times a week    Frequency of Social Gatherings with Friends and Family: Once a week    Attends Religious Services: Never    Database administrator or Organizations: Yes    Attends Banker Meetings: Never    Marital Status: Married     Review of Systems: A 12 point ROS discussed and pertinent positives are indicated in the HPI above.  All other systems are negative.  Review of Systems  Constitutional:  Negative for chills and fever.  Respiratory:  Negative for shortness of breath.   Cardiovascular:  Negative for chest pain.  Gastrointestinal:  Negative for abdominal pain.  Genitourinary:  Positive for difficulty urinating.    Vital Signs: BP (!) 169/55   Pulse 99   Temp 98.1 F (36.7 C) (Oral)   Resp 18   Ht 5\' 10"  (1.778 m)   Wt 181 lb (82.1 kg)   SpO2 94%   BMI 25.97 kg/m   Advance Care Plan: The advanced care plan/surrogate decision maker was discussed at the time of visit and documented in the  medical record.  Physical Exam HENT:     Head: Normocephalic.     Mouth/Throat:     Mouth: Mucous membranes are dry.  Cardiovascular:     Rate and Rhythm: Normal rate and regular rhythm.  Pulmonary:     Effort: Pulmonary effort is normal.     Breath sounds: Wheezing and rhonchi present.  Musculoskeletal:     Cervical back: Normal range of motion.  Skin:    General: Skin is warm.  Neurological:     Mental Status: He is alert and oriented to person, place, and time.  Psychiatric:        Thought Content: Thought content normal.        Judgment: Judgment normal.     Imaging: CT Renal Stone Study Result Date: 02/10/2024 CLINICAL DATA:  Flank pain, abnormal labs EXAM: CT ABDOMEN AND PELVIS WITHOUT CONTRAST TECHNIQUE: Multidetector CT imaging of the abdomen and pelvis was performed following the standard protocol without IV contrast. RADIATION DOSE REDUCTION: This exam was performed according to the departmental dose-optimization program which includes automated exposure control, adjustment of the mA and/or kV according to patient size and/or use of iterative reconstruction technique. COMPARISON:  01/26/2017 FINDINGS: Lower chest: Small bilateral pleural effusions. Associated compressive atelectasis in the bilateral lower lobes. Hepatobiliary: Scattered hepatic cysts measuring up to 2.9 cm in the posterior right hepatic lobe (series 2/image 14). Gallbladder is notable for tiny layering gallstones (series 2/image 22), without associated inflammatory changes. No intrahepatic or extrahepatic ductal dilatation. Pancreas: Within normal limits. Spleen: Within normal limits. Adrenals/Urinary Tract: Low-density 1.7 cm renal nodule, measuring less than 20 HUs, indeterminate but likely reflecting a benign adrenal adenoma. Right adrenal gland is within normal limits. Numerous simple bilateral renal cysts, measuring up to 4.2 cm in the lateral left upper kidney (series 2/image 28), benign (Bosniak I). No  follow-up is recommended. 15 mm hyperdense lesion along the anterior right upper kidney, measuring chest under 70 HUs, technically indeterminate but unchanged from 2018, compatible with a benign hemorrhagic cyst (Bosniak II). No follow-up is recommended. No hydronephrosis. Bladder is within normal limits. Stomach/Bowel: Stomach is within normal limits. No evidence of bowel obstruction. Normal appendix (series 2/image 40). Mild left colonic diverticulosis, without evidence of diverticulitis. Vascular/Lymphatic: Aorta bi-iliac stents. Atherosclerotic calcifications of the abdominal aorta and branch vessels. Excluded 1.8 cm right common iliac artery aneurysm (series 2/image 56). No suspicious abdominopelvic lymphadenopathy. Reproductive: Mild prostatomegaly with dystrophic calcifications. Other: No abdominopelvic ascites. Musculoskeletal: Status post PLIF at L3-S1. Anterior lumbar fixation at L5-S1. IMPRESSION: Cholelithiasis, without associated inflammatory changes. Mild left colonic diverticulosis, without evidence of diverticulitis. Small bilateral pleural effusions. Additional ancillary findings as above. Aortic Atherosclerosis (ICD10-I70.0). Electronically Signed   By: Charline Bills M.D.   On: 02/10/2024 23:44    Labs:  CBC: Recent Labs    02/10/24 1920 02/11/24 0522  WBC 8.7 8.1  HGB 11.0* 8.9*  HCT 35.4* 28.3*  PLT 328 275    COAGS: No results for input(s): "INR", "APTT" in the last 8760 hours.  BMP: Recent Labs    02/10/24 1920 02/11/24 0522  NA 136 139  K 5.9* 5.7*  CL 103 105  CO2 14* 16*  GLUCOSE 126* 121*  BUN 104* 104*  CALCIUM 8.3* 7.5*  CREATININE 11.09* 10.30*  GFRNONAA 4* 5*    LIVER FUNCTION TESTS: Recent Labs    02/10/24 1920 02/11/24 0522  BILITOT 0.4 0.6  AST 14* 10*  ALT 13 13  ALKPHOS 80 61  PROT 6.8 5.5*  ALBUMIN  3.3* 2.6*    TUMOR MARKERS: No results for input(s): "AFPTM", "CEA", "CA199", "CHROMGRNA" in the last 8760 hours.  Assessment and  Plan: Acute renal failure  Risks and benefits discussed with the patient including, but not limited to bleeding, infection, vascular injury, pneumothorax which may require chest tube placement, air embolism or even death  All of the patient's questions were answered, patient is agreeable to proceed. Consent signed and in chart.  Thank you for this interesting consult.  I greatly enjoyed meeting Joseph Hebert. and look forward to participating in their care.  A copy of this report was sent to the requesting provider on this date.  Electronically Signed: Rosalita Levan, PA 02/11/2024, 2:54 PM   I spent a total of 20 Minutes    in face to face in clinical consultation, greater than 50% of which was counseling/coordinating care for acute renal failure.

## 2024-02-11 NOTE — ED Notes (Signed)
 Desats to 89% while sleeping - placed on 2L [place]

## 2024-02-11 NOTE — ED Notes (Signed)
 Patient given sandwich bag and apple juice

## 2024-02-12 ENCOUNTER — Inpatient Hospital Stay (HOSPITAL_COMMUNITY): Payer: Medicare PPO

## 2024-02-12 DIAGNOSIS — I10 Essential (primary) hypertension: Secondary | ICD-10-CM

## 2024-02-12 DIAGNOSIS — J90 Pleural effusion, not elsewhere classified: Secondary | ICD-10-CM

## 2024-02-12 DIAGNOSIS — J9601 Acute respiratory failure with hypoxia: Secondary | ICD-10-CM

## 2024-02-12 DIAGNOSIS — N186 End stage renal disease: Secondary | ICD-10-CM

## 2024-02-12 DIAGNOSIS — N179 Acute kidney failure, unspecified: Secondary | ICD-10-CM | POA: Diagnosis not present

## 2024-02-12 DIAGNOSIS — Z993 Dependence on wheelchair: Secondary | ICD-10-CM

## 2024-02-12 DIAGNOSIS — E872 Acidosis, unspecified: Secondary | ICD-10-CM

## 2024-02-12 DIAGNOSIS — E875 Hyperkalemia: Secondary | ICD-10-CM

## 2024-02-12 DIAGNOSIS — Z992 Dependence on renal dialysis: Secondary | ICD-10-CM

## 2024-02-12 HISTORY — PX: IR FLUORO GUIDE CV LINE RIGHT: IMG2283

## 2024-02-12 HISTORY — DX: End stage renal disease: N18.6

## 2024-02-12 HISTORY — DX: Acidosis, unspecified: E87.20

## 2024-02-12 HISTORY — DX: Pleural effusion, not elsewhere classified: J90

## 2024-02-12 HISTORY — PX: IR US GUIDE VASC ACCESS RIGHT: IMG2390

## 2024-02-12 HISTORY — DX: Hyperkalemia: E87.5

## 2024-02-12 HISTORY — DX: Dependence on wheelchair: Z99.3

## 2024-02-12 LAB — BLOOD GAS, ARTERIAL
Acid-base deficit: 9 mmol/L — ABNORMAL HIGH (ref 0.0–2.0)
Bicarbonate: 16.1 mmol/L — ABNORMAL LOW (ref 20.0–28.0)
Drawn by: 31101
O2 Saturation: 96.3 %
Patient temperature: 36.6
pCO2 arterial: 31 mm[Hg] — ABNORMAL LOW (ref 32–48)
pH, Arterial: 7.32 — ABNORMAL LOW (ref 7.35–7.45)
pO2, Arterial: 69 mm[Hg] — ABNORMAL LOW (ref 83–108)

## 2024-02-12 LAB — RESP PANEL BY RT-PCR (RSV, FLU A&B, COVID)  RVPGX2
Influenza A by PCR: NEGATIVE
Influenza B by PCR: NEGATIVE
Resp Syncytial Virus by PCR: NEGATIVE
SARS Coronavirus 2 by RT PCR: NEGATIVE

## 2024-02-12 LAB — HEPATITIS B SURFACE ANTIBODY, QUANTITATIVE: Hep B S AB Quant (Post): <3.5 m[IU]/mL — ABNORMAL LOW

## 2024-02-12 LAB — HEMOGLOBIN A1C
Hgb A1c MFr Bld: 5.3 % (ref 4.8–5.6)
Mean Plasma Glucose: 105.41 mg/dL

## 2024-02-12 LAB — RESPIRATORY PANEL BY PCR

## 2024-02-12 LAB — GLUCOSE, CAPILLARY
Glucose-Capillary: 192 mg/dL — ABNORMAL HIGH (ref 70–99)
Glucose-Capillary: 171 mg/dL — ABNORMAL HIGH (ref 70–99)
Glucose-Capillary: 149 mg/dL — ABNORMAL HIGH (ref 70–99)

## 2024-02-12 LAB — RENAL FUNCTION PANEL
Albumin: 3 g/dL — ABNORMAL LOW (ref 3.5–5.0)
Anion gap: 17 — ABNORMAL HIGH (ref 5–15)
BUN: 114 mg/dL — ABNORMAL HIGH (ref 8–23)
CO2: 16 mmol/L — ABNORMAL LOW (ref 22–32)
Calcium: 8.1 mg/dL — ABNORMAL LOW (ref 8.9–10.3)
Chloride: 104 mmol/L (ref 98–111)
Creatinine, Ser: 11.27 mg/dL — ABNORMAL HIGH (ref 0.61–1.24)
GFR, Estimated: 4 mL/min — ABNORMAL LOW (ref >60)
Glucose, Bld: 166 mg/dL — ABNORMAL HIGH (ref 70–99)
Phosphorus: 10 mg/dL — ABNORMAL HIGH (ref 2.5–4.6)
Potassium: 5.7 mmol/L — ABNORMAL HIGH (ref 3.5–5.1)
Sodium: 137 mmol/L (ref 135–145)

## 2024-02-12 LAB — FERRITIN: Ferritin: 81 ng/mL (ref 24–336)

## 2024-02-12 LAB — C-REACTIVE PROTEIN: CRP: 5.2 mg/dL — ABNORMAL HIGH (ref <1.0)

## 2024-02-12 LAB — IRON AND TIBC
Iron: 41 ug/dL — ABNORMAL LOW (ref 45–182)
Saturation Ratios: 15 % — ABNORMAL LOW (ref 17.9–39.5)
TIBC: 273 ug/dL (ref 250–450)
UIBC: 232 ug/dL

## 2024-02-12 LAB — MRSA NEXT GEN BY PCR, NASAL: MRSA by PCR Next Gen: NOT DETECTED

## 2024-02-12 LAB — TROPONIN I (HIGH SENSITIVITY): Troponin I (High Sensitivity): 69 ng/L — ABNORMAL HIGH (ref <18)

## 2024-02-12 LAB — MAGNESIUM: Magnesium: 2.1 mg/dL (ref 1.7–2.4)

## 2024-02-12 LAB — PROCALCITONIN: Procalcitonin: 0.17 ng/mL

## 2024-02-12 MED ORDER — IPRATROPIUM-ALBUTEROL 0.5-2.5 (3) MG/3ML IN SOLN
3.0000 mL | RESPIRATORY_TRACT | Status: DC
  Administered 2024-02-12 (×2): 3 mL via RESPIRATORY_TRACT
  Filled 2024-02-12 (×2): qty 3

## 2024-02-12 MED ORDER — MIDAZOLAM HCL 2 MG/2ML IJ SOLN
INTRAMUSCULAR | Status: AC
  Filled 2024-02-12: qty 2

## 2024-02-12 MED ORDER — IPRATROPIUM-ALBUTEROL 0.5-2.5 (3) MG/3ML IN SOLN
3.0000 mL | Freq: Four times a day (QID) | RESPIRATORY_TRACT | Status: DC
  Administered 2024-02-12 – 2024-02-16 (×9): 3 mL via RESPIRATORY_TRACT
  Filled 2024-02-12 (×12): qty 3

## 2024-02-12 MED ORDER — DOXYCYCLINE HYCLATE 100 MG PO TABS
100.0000 mg | ORAL_TABLET | Freq: Two times a day (BID) | ORAL | Status: DC
  Administered 2024-02-12 – 2024-02-16 (×7): 100 mg via ORAL
  Filled 2024-02-12 (×7): qty 1

## 2024-02-12 MED ORDER — FENTANYL CITRATE (PF) 100 MCG/2ML IJ SOLN
INTRAMUSCULAR | Status: AC
  Filled 2024-02-12: qty 2

## 2024-02-12 MED ORDER — HYDRALAZINE HCL 20 MG/ML IJ SOLN
5.0000 mg | Freq: Once | INTRAMUSCULAR | Status: AC
  Administered 2024-02-12: 5 mg via INTRAVENOUS
  Filled 2024-02-12: qty 1

## 2024-02-12 MED ORDER — LIDOCAINE-EPINEPHRINE 1 %-1:100000 IJ SOLN
INTRAMUSCULAR | Status: AC
Start: 2024-02-12 — End: ?
  Filled 2024-02-12: qty 1

## 2024-02-12 MED ORDER — SODIUM CHLORIDE 0.9 % IV SOLN
100.0000 mg | Freq: Two times a day (BID) | INTRAVENOUS | Status: DC
  Administered 2024-02-12: 100 mg via INTRAVENOUS
  Filled 2024-02-12 (×2): qty 100

## 2024-02-12 MED ORDER — SODIUM ZIRCONIUM CYCLOSILICATE 10 G PO PACK
10.0000 g | PACK | Freq: Once | ORAL | Status: AC
  Administered 2024-02-12: 10 g via ORAL
  Filled 2024-02-12: qty 1

## 2024-02-12 MED ORDER — MIDAZOLAM HCL 2 MG/2ML IJ SOLN
INTRAMUSCULAR | Status: AC | PRN
  Administered 2024-02-12 (×2): .5 mg via INTRAVENOUS

## 2024-02-12 MED ORDER — MAGNESIUM SULFATE 2 GM/50ML IV SOLN
2.0000 g | Freq: Once | INTRAVENOUS | Status: AC
  Administered 2024-02-12: 2 g via INTRAVENOUS
  Filled 2024-02-12: qty 50

## 2024-02-12 MED ORDER — METHYLPREDNISOLONE SODIUM SUCC 125 MG IJ SOLR
125.0000 mg | Freq: Once | INTRAMUSCULAR | Status: AC
  Administered 2024-02-12: 125 mg via INTRAVENOUS
  Filled 2024-02-12: qty 2

## 2024-02-12 MED ORDER — ALBUTEROL SULFATE (2.5 MG/3ML) 0.083% IN NEBU
10.0000 mg | INHALATION_SOLUTION | RESPIRATORY_TRACT | Status: DC
  Administered 2024-02-12: 10 mg via RESPIRATORY_TRACT
  Filled 2024-02-12: qty 12

## 2024-02-12 MED ORDER — HEPARIN SODIUM (PORCINE) 1000 UNIT/ML IJ SOLN
INTRAMUSCULAR | Status: AC
Start: 2024-02-12 — End: 2024-02-13
  Filled 2024-02-12: qty 4

## 2024-02-12 MED ORDER — FENTANYL CITRATE (PF) 100 MCG/2ML IJ SOLN
INTRAMUSCULAR | Status: AC | PRN
  Administered 2024-02-12 (×2): 25 ug via INTRAVENOUS

## 2024-02-12 MED ORDER — HEPARIN SODIUM (PORCINE) 1000 UNIT/ML IJ SOLN
INTRAMUSCULAR | Status: AC
  Filled 2024-02-12: qty 10

## 2024-02-12 MED ORDER — INSULIN ASPART 100 UNIT/ML IJ SOLN: 0.0000 [IU] | Freq: Every day | INTRAMUSCULAR | Status: DC

## 2024-02-12 MED ORDER — SODIUM CHLORIDE 0.9 % IV SOLN
1.0000 g | Freq: Every day | INTRAVENOUS | Status: DC
  Administered 2024-02-12 – 2024-02-13 (×3): 1 g via INTRAVENOUS
  Filled 2024-02-12 (×3): qty 10

## 2024-02-12 MED ORDER — CEFAZOLIN SODIUM-DEXTROSE 2-4 GM/100ML-% IV SOLN
INTRAVENOUS | Status: AC
  Filled 2024-02-12: qty 100

## 2024-02-12 MED ORDER — INSULIN ASPART 100 UNIT/ML IJ SOLN: 0.0000 [IU] | Freq: Three times a day (TID) | INTRAMUSCULAR | Status: DC

## 2024-02-12 MED ORDER — LIDOCAINE-EPINEPHRINE 1 %-1:100000 IJ SOLN
10.0000 mL | Freq: Once | INTRAMUSCULAR | Status: AC
  Administered 2024-02-12: 10 mL

## 2024-02-12 MED ORDER — METHYLPREDNISOLONE SODIUM SUCC 125 MG IJ SOLR: 80.0000 mg | Freq: Every day | INTRAMUSCULAR | Status: DC

## 2024-02-12 MED ORDER — HYDRALAZINE HCL 20 MG/ML IJ SOLN
10.0000 mg | INTRAMUSCULAR | Status: DC | PRN
Start: 2024-02-12 — End: 2024-02-16

## 2024-02-12 NOTE — Assessment & Plan Note (Addendum)
 02-12-2024 stable. Not wheezing. No exacerbation. Checking RVP to make sure he doesn't have any other viral illnesses.[name] RVP/flu/covid is negative. Stop steroids. 02-13-2024 pt is not wheezing. His COPD is not exacerbated. 02-14-2024 stable.  02-15-2024 stable. Chronic.  02-16-2024 stable. Not exacerbated.

## 2024-02-12 NOTE — Progress Notes (Signed)
 Pt is in dialysis

## 2024-02-12 NOTE — Assessment & Plan Note (Signed)
 02-12-2024 seen by nephrology. See "ESRD on dialysis"

## 2024-02-12 NOTE — Significant Event (Signed)
 Rapid Response Event Note   Reason for Call :  SOB, increased oxygen demand (3L > 6L)  Initial Focused Assessment:  Pt sitting up in bed with eyes open, his breathing is tachypneic and labored, audible wheezes present. Pt c/o SOB and 6/10 midsternal chest pressure. Pt says his became SOB and then developed chest pressure. ABD soft, nontender. Skin hot and diaphoretic.   HR-95, BP-180/72, RR-24, SpO2-94% on 6L [place]   Interventions:  Alb tx EKG-SR with 1st degree AV block PCXR ABG Ntg SL x 1 Solumedrol 125mg  IV  Continuous Alb tx Continuous tele monitoring BC x2, Mg, Trop, CRP, Pct Rocephin, Vibramycin MRSA nasal swab  Flutter valve Plan of Care:  Pt feels and looks better after interventions. Await PCXR/ABG/lab results. Please don't give Abx until [facility] are drawn. Please call RRT if further assistance needed.   Event Summary:   MD Notified: Dr. Joneen Roach notified and came to bedside.  Call Time:0234 Arrival Time:0238 End ZOXW:[id number]  Terrilyn Saver, RN

## 2024-02-12 NOTE — Plan of Care (Signed)
 Pt continues on 6L o2, nasal cannula, denies sob. CHG bath completed. Pt has been NPO since midnight. Safety maintained. Will continue to monitor.    Problem: Education: Goal: Knowledge of General Education information will improve Description: Including pain rating scale, medication(s)/side effects and non-pharmacologic comfort measures Outcome: Progressing   Problem: Health Behavior/Discharge Planning: Goal: Ability to manage health-related needs will improve Outcome: Progressing   Problem: Clinical Measurements: Goal: Ability to maintain clinical measurements within normal limits will improve Outcome: Progressing Goal: Will remain free from infection Outcome: Progressing Goal: Diagnostic test results will improve Outcome: Progressing Goal: Respiratory complications will improve Outcome: Progressing Goal: Cardiovascular complication will be avoided Outcome: Progressing   Problem: Activity: Goal: Risk for activity intolerance will decrease Outcome: Progressing   Problem: Nutrition: Goal: Adequate nutrition will be maintained Outcome: Progressing   Problem: Coping: Goal: Level of anxiety will decrease Outcome: Progressing   Problem: Elimination: Goal: Will not experience complications related to bowel motility Outcome: Progressing Goal: Will not experience complications related to urinary retention Outcome: Progressing   Problem: Pain Managment: Goal: General experience of comfort will improve and/or be controlled Outcome: Progressing   Problem: Safety: Goal: Ability to remain free from injury will improve Outcome: Progressing   Problem: Skin Integrity: Goal: Risk for impaired skin integrity will decrease Outcome: Progressing

## 2024-02-12 NOTE — Assessment & Plan Note (Signed)
 02-12-2024 give po lokelma 02-13-2024 K 5.1 today. Pt going to HD again today.  02-14-2024 resolved. K 4.2

## 2024-02-12 NOTE — Progress Notes (Signed)
   02/11/24 2236  Vitals  BP (!) 169/65  MAP (mmHg) 91  BP Location Left Arm  BP Method Automatic  Patient Position (if appropriate) Lying  Pulse Rate 94  Pulse Rate Source Monitor  MEWS COLOR  MEWS Score Color Green  Oxygen Therapy  SpO2 94 %  O2 Device Nasal Cannula  O2 Flow Rate (L/min) 3 L/min  MEWS Score  MEWS Temp 0  MEWS Systolic 0  MEWS Pulse 0  MEWS RR 0  MEWS LOC 0  MEWS Score 0    Scheduled BP medications given as ordered- see emar. Pt is A&O x 4. O2 sat 91% on 2L O2/[place] and o2 increased to 3L /nasal cannula for comfort. Denies sob.  Safety maintained. Bed alarm on. Call bell in reach. Will continue to monitor.

## 2024-02-12 NOTE — Progress Notes (Addendum)
 Called by nursing, patient in respiratory distress, complaint of chest tightness. On 6 L of oxygen. Patient seen, generalized wheezing noted Continuous nebulizer albuterol 10 mg ordered. Stat chest x-ray shows multifocal pneumonia IV Solu-Medrol 125 mg x 1, IV magnesium 2 g given Blood cultures x 2 ordered, procalcitonin, CRP, CBC ordered IV Rocephin and doxycycline ordered Robitussin, flutter valve ordered MRSA nasal swab ordered ABG done reassuring EKG without acute ischemic changes.  Patient's hypotensive systolic blood pressure 170s.  Sublingual nitroglycerin given with no effect and chest discomfort but decreased blood pressure.

## 2024-02-12 NOTE — Procedures (Signed)
 Interventional Radiology Procedure Note  Procedure: Placement of a right IJ approach tunneled HD cath.  23cm tip to cuff.  Tip is positioned at the superior cavoatrial junction and catheter is ready for immediate use.  Complications: None Recommendations:  - Ok to use - Do not submerge - Routine line care  - ok to advance diet per primary order  Signed,  Yvone Neu. Loreta Ave, DO

## 2024-02-12 NOTE — Progress Notes (Addendum)
   02/12/24 0235  Vitals  BP (!) 181/76  MAP (mmHg) 106  BP Location Left Arm  BP Method Automatic  Patient Position (if appropriate) Sitting  Pulse Rate 100  Pulse Rate Source Monitor  Resp (!) 23  MEWS COLOR  MEWS Score Color Green  Oxygen Therapy  SpO2 91 %  O2 Device Nasal Cannula  O2 Flow Rate (L/min) 6 L/min  MEWS Score  MEWS Temp 0  MEWS Systolic 0  MEWS Pulse 0  MEWS RR 1  MEWS LOC 0  MEWS Score 1    Pt c/o difficulty breathing and noted o2 sats ranges 80's on 3L o2,nasal cannula, tachypneic, increased work of breathing. O2 titrated upto 6L, nasal cannula. Pt started to c/o mid chest heaviness and audible wheezing noted. RT paged. Rapid Response initiated. Dr. Joneen Roach notified via secure chat.

## 2024-02-12 NOTE — Assessment & Plan Note (Addendum)
 02-12-2024 on hydralazine 25 mg bid, norvasc 10 mg at bedtime.. Will need to see how his BP reacts to HD. 02-13-2024 s/p 2 liters removed with UF yesterday. Going for HD session #2 today.  Continue to monitor BP. Hold off on adding any further HTN meds until he is euvolemic on his volume status. 02-14-2024 stable. Pt limited to 60 ounces of fluid per day. BP low today. May need to hold some of his BP meds.  02-15-2024 continue at bedtime novasc and hydralazine bid.  02-16-2024 stable.

## 2024-02-12 NOTE — Hospital Course (Addendum)
 HPI: Joseph Hebert. is a 78 y.o. male with medical history significant for DM which does not require medication, htn and ESRD.  The patient does not know what his baseline creatinine is but he tells me that the nephrologist have been talking to him about the possibility of hemodialysis for his last couple of visits he had a routine visit today and had blood work.  He says he went home and was called on the phone and told to come to the emergency room right away.  Patient has been unable to urinate all day today despite trying.  He denies any chest pain or shortness of breath or fevers or chills.  In the emergency department the patient's blood work revealed a creatinine of 11 and a potassium of 5.9.  The bladder scan revealed only 250 mL of urine and the patient was unable to urinate.  Urology was called from the emergency department.  He will be admitted likely will require initiation of HD during this hospital stay.  Significant Events: Admitted 02/10/2024 acute on CKD stage 5(not on HD)   Significant Labs: WBC 8.7, HgB 11.0, plt 328 Na 136, K 5.9, CO2 of 14, BUN 104, scr 11.09, glu 126  Significant Imaging Studies: CT renal stone Cholelithiasis, without associated inflammatory changes. Mild left colonic diverticulosis, without evidence of diverticulitis. Small bilateral pleural effusions. Additional ancillary findings as above.Aortic Atherosclerosis  CXR Multifocal right lung opacities, raising concern for mild multifocal pneumonia. Superimposed bilateral lower lobe atelectasis. Layering small to moderate right pleural effusion.  Antibiotic Therapy: Anti-infectives (From admission, onward)    Start     Dose/Rate Route Frequency Ordered Stop   02/12/24 1000  ceFAZolin (ANCEF) IVPB 2g/100 mL premix       Note to Pharmacy: Admin in IR only   2 g 200 mL/hr over 30 Minutes Intravenous  Once 02/11/24 1450     02/12/24 0400  doxycycline (VIBRAMYCIN) 100 mg in sodium chloride 0.9 % 250 mL IVPB         100 mg 125 mL/hr over 120 Minutes Intravenous 2 times daily 02/12/24 0258     02/12/24 0300  cefTRIAXone (ROCEPHIN) 1 g in sodium chloride 0.9 % 100 mL IVPB        1 g 200 mL/hr over 30 Minutes Intravenous Daily at bedtime 02/12/24 0258        Procedures: 02-12-2024 right HD catheter placement 02-14-2024 left UE AV fistular creation  Consultants: Nephrology

## 2024-02-12 NOTE — Assessment & Plan Note (Addendum)
 02-12-2024 events of last night noted.  Now on 5 L/min supplemental O2. Not on home O2. Had bilateral pleural effusions on CT renal stone 02-10-2024.  Flu/covid/RSV negative. Checking RVP.  No fevers. No leukocytosis.  Procal only mildly elevated. Don't think this is really pneumonia. Most likely volume overload from ESRD. Hopefully HD will help this that. Appears pt is going to have back-to-back HD session over next 2 days. Will re-evaluate pleural effusion. May need thoracentesis after 2 HD sessions. Discussed pt, wife and sister-in-law. Continue abx for now. 02-13-2024 remains on 4 L/min. No fevers. Pt with moderate/large right pleural effusion. My estimates are about [phone] ml of pleural fluid on the right side. Will send pt to [facility] tomorrow for right side thoracentesis. Pt is agreeable. He is not on any systemic anticoagulants. Still don't think he has pneumonia. Repeat CBC and procal in AM. Will probably stop IV abx tomorrow. 02-14-2024 IR able to get his right thoracentesis done yesterday. As expected he has 1000 ml of pleural fluid removed from right pleural space. Appears to be transudate. Procal minimally elevated. Can change to po abx. Wean O2 to RA.  02-15-2024 weaned to RA. Right thoracentesis helped out a lot.  02-16-2024 pt qualified for home O2 yesterday with RA sats of 82%. Needed 4 L/min of O2. Will go home with supplemental O2.

## 2024-02-12 NOTE — Progress Notes (Signed)
   02/12/24 1809  Vitals  Temp 98.7 F (37.1 C)  Pulse Rate 98  Resp 17  BP (!) 130/53  SpO2 94 %  O2 Device Nasal Cannula  Oxygen Therapy  O2 Flow Rate (L/min) 3 L/min  Patient Activity (if Appropriate) In bed  Pulse Oximetry Type Continuous  Post Treatment  Dialyzer Clearance Clear  Hemodialysis Intake (mL) 0 mL  Liters Processed 24  Fluid Removed (mL) 2000 mL  Tolerated HD Treatment Yes  Post-Hemodialysis Comments Pt tolerated HD without difficulties. Pt. resting and talking on the phone. Report call to bedside Nurse on 2W. UF goal met and admin medication per order.   Received patient in bed to unit.  Alert and oriented.  Informed consent signed and in chart.   TX duration:2 hours  Patient tolerated well.  Transported back to the room  Alert, without acute distress.  Hand-off given to patient's nurse.   Access used: Yes Access issues: No  Total UF removed: 2000 Medication(s) given: See MAR Post HD VS: See Above Grid Post HD weight: 86.5 kg   [facility]

## 2024-02-12 NOTE — Progress Notes (Signed)
 Pt off floor for Tunneled dialysis catheter placement.

## 2024-02-12 NOTE — Assessment & Plan Note (Addendum)
 02-12-2024 had HD catheter placed today. Nephrology planning back-to-back HD sessions.  Pt lives in [place]. Will need renal coordinator to start outpt HD slot search. 02-13-2024 dialysis coordinator has started outpatient HD slot search 02-14-2024 continue HD per nephrology. He is now S/P left AVF creation today. Won't be ready to use for at least 6 months. Dialysis coordinator working on outpatient HD slot. 02-15-2024 outpatient HD slot secured at [facility] on TTS 11:55 am chair time. Pt can start on Tuesday, March 4 and will need to arrive at 10:55 am to complete paperwork prior to treatment.   Going for HD session #3 today.  Pt is now s/p left AVF. Will need to mature before it can be used for outpatient HD.  02-16-2024 pt to sit in recliner for 3-4 hours today to simulate the time he needs to sit in a recliner during outpatient HD. If he is able to do this, he can be discharged to home.

## 2024-02-12 NOTE — Progress Notes (Addendum)
 PROGRESS NOTE    Burgess Amor.  HYQ:[id number] DOB: [birth year] DOA: 02/10/2024 PCP: Paulina Fusi, MD  Subjective: Pt seen and examined. Met with pt, wife and sister-in-law at bedside. Afebrile. Notes of last night reviewed. Has not had any fevers. Had sudden SOB. CXR shows enlarging right sided effusion. Was only small on CT renal stone on 02-10-2024. Now on 5 L/min supplemental O2. Not on home O2.  Pt is wheelchair dependent at home. Has been for about 9-10 years. After his back surgery. Only able to stand to transfer. Had WC capable home and WB capable truck.  Pt and family intends for patient to go home at discharge.   Hospital Course: HPI: Markevious Ehmke. is a 78 y.o. male with medical history significant for DM which does not require medication, htn and ESRD.  The patient does not know what his baseline creatinine is but he tells me that the nephrologist have been talking to him about the possibility of hemodialysis for his last couple of visits he had a routine visit today and had blood work.  He says he went home and was called on the phone and told to come to the emergency room right away.  Patient has been unable to urinate all day today despite trying.  He denies any chest pain or shortness of breath or fevers or chills.  In the emergency department the patient's blood work revealed a creatinine of 11 and a potassium of 5.9.  The bladder scan revealed only 250 mL of urine and the patient was unable to urinate.  Urology was called from the emergency department.  He will be admitted likely will require initiation of HD during this hospital stay.  Significant Events: Admitted 02/10/2024 acute on CKD stage 5(not on HD)   Significant Labs: WBC 8.7, HgB 11.0, plt 328 Na 136, K 5.9, CO2 of 14, BUN 104, scr 11.09, glu 126  Significant Imaging Studies: CT renal stone Cholelithiasis, without associated inflammatory changes. Mild left colonic diverticulosis, without evidence of  diverticulitis. Small bilateral pleural effusions. Additional ancillary findings as above.Aortic Atherosclerosis  CXR Multifocal right lung opacities, raising concern for mild multifocal pneumonia. Superimposed bilateral lower lobe atelectasis. Layering small to moderate right pleural effusion.  Antibiotic Therapy: Anti-infectives (From admission, onward)    Start     Dose/Rate Route Frequency Ordered Stop   02/12/24 1000  ceFAZolin (ANCEF) IVPB 2g/100 mL premix       Note to Pharmacy: Admin in IR only   2 g 200 mL/hr over 30 Minutes Intravenous  Once 02/11/24 1450     02/12/24 0400  doxycycline (VIBRAMYCIN) 100 mg in sodium chloride 0.9 % 250 mL IVPB        100 mg 125 mL/hr over 120 Minutes Intravenous 2 times daily 02/12/24 0258     02/12/24 0300  cefTRIAXone (ROCEPHIN) 1 g in sodium chloride 0.9 % 100 mL IVPB        1 g 200 mL/hr over 30 Minutes Intravenous Daily at bedtime 02/12/24 0258        Procedures: 02-12-2024 right HD catheter placement  Consultants: Nephrology    Assessment and Plan: * Acute renal failure superimposed on stage 5 chronic kidney disease, not on chronic dialysis (HCC) 02-12-2024 seen by nephrology. See "ESRD on dialysis"  ESRD (end stage renal disease) on dialysis (HCC) 02-12-2024 had HD catheter placed today. Nephrology planning back-to-back HD sessions.  Pt lives in [place]. Will need renal coordinator to start outpt HD  slot search.  Acute respiratory failure with hypoxia (HCC) 02-12-2024 events of last night noted.  Now on 5 L/min supplemental O2. Not on home O2. Had bilateral pleural effusions on CT renal stone 02-10-2024.  Flu/covid/RSV negative. Checking RVP.  No fevers. No leukocytosis.  Procal only mildly elevated. Don't think this is really pneumonia. Most likely volume overload from ESRD. Hopefully HD will help this that. Appears pt is going to have back-to-back HD session over next 2 days. Will re-evaluate pleural effusion. May need thoracentesis  after 2 HD sessions. Discussed pt, wife and sister-in-law. Continue abx for now.  Metabolic acidosis 02-12-2024 on po bicarb bid.  Hyperkalemia 02-12-2024 give po lokelma  Bilateral pleural effusion 02-12-2024 seen on CT renal stone 02-09-2025. I think his enlarging bilateral pleural effusions R > L is likely responsible for his acute respiratory failure with hypoxia rather than pneumonia. Will see if HD is able to remove some of the effusion after 2 HD session. If he is still on O2 after 2 HD session, may need to proceed with thoracentesis to remove effusions.  Essential hypertension 02-12-2024 on hydralazine 25 mg bid, norvasc 10 mg at bedtime.. Will need to see how his BP reacts to HD.  Dependent for wheelchair mobility 02-12-2024 pt has been wheelchair dependent for about 9-10 years. After his back surgery. Able to transfer to wheelchair. Discussed with pt, wife and sister-in-law.  Diabetes mellitus without complication (HCC) 02-12-2024 add SSI. ESRD scale.  Dementia (HCC) 02-12-2024 stable. On namenda bid. Seems pretty appropriate today. Monitor for hospital acquired delirium.  COPD (chronic obstructive pulmonary disease) (HCC) 02-12-2024 stable. Not wheezing. No exacerbation. Checking RVP to make sure he doesn't have any other viral illnesses.[name] RVP/flu/covid is negative. Stop steroids.  History of anemia due to chronic kidney disease 02-12-2024 defer to nephrology if he needs epo  DVT prophylaxis: heparin injection 5,000 Units Start: 02/11/24 1400    Code Status: Full Code Family Communication: discussed with pt, wife(mary) and pt's sister-in-law at bedside Disposition Plan: return home Reason for continuing need for hospitalization: on IV abx, starting HD today. Had HD catheter placed today.  Objective: Vitals:   02/12/24 0915 02/12/24 0929 02/12/24 1000 02/12/24 1030  BP: (!) 139/51 (!) 135/50 (!) 131/54 (!) 128/56  Pulse: 90 88 94 88  Resp: 12 15    Temp:  (!) 97.4  F (36.3 C) 98 F (36.7 C) 98 F (36.7 C)  TempSrc:  Oral Oral Oral  SpO2: 93% 95% 92% 94%  Weight:      Height:        Intake/Output Summary (Last 24 hours) at 02/12/2024 1520 Last data filed at 02/12/2024 0601 Gross per 24 hour  Intake 231.66 ml  Output 550 ml  Net -318.34 ml   Filed Weights   02/10/24 1903  Weight: 82.1 kg    Examination:  Physical Exam Vitals and nursing note reviewed.  Constitutional:      General: He is not in acute distress.    Appearance: He is not toxic-appearing.  HENT:     Head: Normocephalic and atraumatic.     Nose: Nose normal.  Cardiovascular:     Rate and Rhythm: Normal rate and regular rhythm.  Pulmonary:     Effort: Pulmonary effort is normal. No respiratory distress.     Breath sounds: Examination of the right-lower field reveals decreased breath sounds. Examination of the left-lower field reveals decreased breath sounds. Decreased breath sounds present.  Abdominal:     General: Abdomen is  protuberant. Bowel sounds are normal.     Palpations: Abdomen is soft.  Musculoskeletal:     Right lower leg: No edema.     Left lower leg: No edema.  Skin:    General: Skin is warm and dry.     Capillary Refill: Capillary refill takes less than 2 seconds.  Neurological:     Mental Status: He is alert and oriented to person, place, and time.   Data Reviewed: I have personally reviewed following labs and imaging studies  CBC: Recent Labs  Lab 02/10/24 1920 02/11/24 0522 02/11/24 1513  WBC 8.7 8.1 8.3  HGB 11.0* 8.9* 9.6*  HCT 35.4* 28.3* 30.3*  MCV 95.2 94.0 94.1  PLT 328 275 293   Basic Metabolic Panel: Recent Labs  Lab 02/10/24 1920 02/11/24 0522 02/11/24 1513 02/12/24 0343 02/12/24 0354  NA 136 139 135  --  137  K 5.9* 5.7* 5.6*  --  5.7*  CL 103 105 104  --  104  CO2 14* 16* 15*  --  16*  GLUCOSE 126* 121* 240*  --  166*  BUN 104* 104* 102*  --  114*  CREATININE 11.09* 10.30* 10.80*  --  11.27*  CALCIUM 8.3* 7.5* 7.9*   --  8.1*  MG  --   --   --  2.1  --   PHOS  --  10.5* 10.6*  --  10.0*   GFR: Estimated Creatinine Clearance: 5.6 mL/min (A) (by C-G formula based on SCr of 11.27 mg/dL (H)). Liver Function Tests: Recent Labs  Lab 02/10/24 1920 02/11/24 0522 02/11/24 1513 02/12/24 0354  AST 14* 10*  --   --   ALT 13 13  --   --   ALKPHOS 80 61  --   --   BILITOT 0.4 0.6  --   --   PROT 6.8 5.5*  --   --   ALBUMIN 3.3* 2.6* 2.9* 3.0*   Recent Labs  Lab 02/10/24 1920  LIPASE 51   CBG: Recent Labs  Lab 02/11/24 0146 02/12/24 0737 02/12/24 1141  GLUCAP 128* 192* 171*   Anemia Panel: Recent Labs    02/12/24 0343  FERRITIN 81  TIBC 273  IRON 41*   Sepsis Labs: Recent Labs  Lab 02/12/24 0343  PROCALCITON 0.17    Recent Results (from the past 240 hours)  MRSA Next Gen by PCR, Nasal     Status: None   Collection Time: 02/12/24  2:59 AM   Specimen: Nasal Mucosa; Nasal Swab  Result Value Ref Range Status   MRSA by PCR Next Gen NOT DETECTED NOT DETECTED Final    Comment: (NOTE) The GeneXpert MRSA Assay (FDA approved for NASAL specimens only), is one component of a comprehensive MRSA colonization surveillance program. It is not intended to diagnose MRSA infection nor to guide or monitor treatment for MRSA infections. Test performance is not FDA approved in patients less than 40 years old. Performed at [facility], [address] [place], [place] [address]   Culture, blood (Routine X 2) w Reflex to ID Panel     Status: None (Preliminary result)   Collection Time: 02/12/24  3:43 AM   Specimen: BLOOD  Result Value Ref Range Status   Specimen Description BLOOD BLOOD LEFT ARM  Final   Special Requests   Final    BOTTLES DRAWN AEROBIC AND ANAEROBIC Blood Culture results may not be optimal due to an inadequate volume of blood received in culture bottles  Culture   Final    NO GROWTH < 12 HOURS Performed at [facility], [address], [place], [place] [address]     Report Status PENDING  Incomplete  Culture, blood (Routine X 2) w Reflex to ID Panel     Status: None (Preliminary result)   Collection Time: 02/12/24  3:54 AM   Specimen: BLOOD  Result Value Ref Range Status   Specimen Description BLOOD BLOOD RIGHT HAND  Final   Special Requests   Final    BOTTLES DRAWN AEROBIC AND ANAEROBIC Blood Culture adequate volume   Culture   Final    NO GROWTH < 12 HOURS Performed at [facility], [address], [place], [place] [address]    Report Status PENDING  Incomplete  Resp panel by RT-PCR (RSV, Flu A&B, Covid) Anterior Nasal Swab     Status: None   Collection Time: 02/12/24 10:48 AM   Specimen: Anterior Nasal Swab  Result Value Ref Range Status   SARS Coronavirus 2 by RT PCR NEGATIVE NEGATIVE Final   Influenza A by PCR NEGATIVE NEGATIVE Final   Influenza B by PCR NEGATIVE NEGATIVE Final    Comment: (NOTE) The Xpert Xpress SARS-CoV-2/FLU/RSV plus assay is intended as an aid in the diagnosis of influenza from Nasopharyngeal swab specimens and should not be used as a sole basis for treatment. Nasal washings and aspirates are unacceptable for Xpert Xpress SARS-CoV-2/FLU/RSV testing.  Fact Sheet for Patients: BloggerCourse.com  Fact Sheet for Healthcare Providers: SeriousBroker.it  This test is not yet approved or cleared by the [place] FDA and has been authorized for detection and/or diagnosis of SARS-CoV-2 by FDA under an Emergency Use Authorization (EUA). This EUA will remain in effect (meaning this test can be used) for the duration of the COVID-19 declaration under Section 564(b)(1) of the Act, 21 U.S.C. section 360bbb-3(b)(1), unless the authorization is terminated or revoked.     Resp Syncytial Virus by PCR NEGATIVE NEGATIVE Final    Comment: (NOTE) Fact Sheet for Patients: BloggerCourse.com  Fact Sheet for Healthcare  Providers: SeriousBroker.it  This test is not yet approved or cleared by the [place] FDA and has been authorized for detection and/or diagnosis of SARS-CoV-2 by FDA under an Emergency Use Authorization (EUA). This EUA will remain in effect (meaning this test can be used) for the duration of the COVID-19 declaration under Section 564(b)(1) of the Act, 21 U.S.C. section 360bbb-3(b)(1), unless the authorization is terminated or revoked.  Performed at [facility], [address] [place], [place] [address]     Radiology Studies: IR Fluoro Guide CV Line Right Result Date: 02/12/2024 INDICATION: 78 year old male referred for hemodialysis catheter placement EXAM: TUNNELED CENTRAL VENOUS HEMODIALYSIS CATHETER PLACEMENT WITH ULTRASOUND AND FLUOROSCOPIC GUIDANCE MEDICATIONS: 2 g Ancef. The antibiotic was given in an appropriate time interval prior to skin puncture. ANESTHESIA/SEDATION: Moderate (conscious) sedation was employed during this procedure. A total of Versed 1.0 mg and Fentanyl 50 mcg was administered intravenously by the radiology nurse. Total intra-service moderate Sedation Time: 15 minutes. The patient's level of consciousness and vital signs were monitored continuously by radiology nursing throughout the procedure under my direct supervision. FLUOROSCOPY: Radiation Exposure Index (as provided by the fluoroscopic device): 3 mGy Kerma COMPLICATIONS: None PROCEDURE: Informed written consent was obtained from the patient after a discussion of the risks, benefits, and alternatives to treatment. Questions regarding the procedure were encouraged and answered. The right neck and chest were prepped with chlorhexidine in  a sterile fashion, and a sterile drape was applied covering the operative field. Maximum barrier sterile technique with sterile gowns and gloves were used for the procedure. A timeout was performed prior to the initiation of the procedure. Ultrasound  survey was performed. The right internal jugular vein was confirmed to be patent, with images stored and sent to PACS. Micropuncture kit was utilized to access the right internal jugular vein under direct, real-time ultrasound guidance after the overlying soft tissues were anesthetized with 1% lidocaine with epinephrine. Stab incision was made with 11 blade scalpel. Microwire was passed centrally. The microwire was then marked to measure appropriate internal catheter length. External tunneled length was estimated. A total tip to cuff length of 23 cm was selected. 035 guidewire was advanced to the level of the IVC. Skin and subcutaneous tissues of chest wall below the clavicle were generously infiltrated with 1% lidocaine for local anesthesia. A small stab incision was made with 11 blade scalpel. The selected hemodialysis catheter was tunneled in a retrograde fashion from the anterior chest wall to the venotomy incision. Serial dilation was performed and then a peel-away sheath was placed. The catheter was then placed through the peel-away sheath with tips ultimately positioned within the superior aspect of the right atrium. Final catheter positioning was confirmed and documented with a spot radiographic image. The catheter aspirates and flushes normally. The catheter was flushed with appropriate volume heparin dwells. The catheter exit site was secured with a 0-Prolene retention suture. Gel-Foam slurry was infused into the soft tissue tract. The venotomy incision was closed Derma bond and sterile dressing. Dressings were applied at the chest wall. Patient tolerated the procedure well and remained hemodynamically stable throughout. No complications were encountered and no significant blood loss encountered. IMPRESSION: Status post image guided right IJ tunneled hemodialysis catheter. Signed, Yvone Neu. Miachel Roux, RPVI [facility] Radiology Electronically Signed    By: Gilmer Mor D.O.   On: 02/12/2024 09:16   IR US Guide Vasc Access Right Result Date: 02/12/2024 INDICATION: 78 year old male referred for hemodialysis catheter placement EXAM: TUNNELED CENTRAL VENOUS HEMODIALYSIS CATHETER PLACEMENT WITH ULTRASOUND AND FLUOROSCOPIC GUIDANCE MEDICATIONS: 2 g Ancef. The antibiotic was given in an appropriate time interval prior to skin puncture. ANESTHESIA/SEDATION: Moderate (conscious) sedation was employed during this procedure. A total of Versed 1.0 mg and Fentanyl 50 mcg was administered intravenously by the radiology nurse. Total intra-service moderate Sedation Time: 15 minutes. The patient's level of consciousness and vital signs were monitored continuously by radiology nursing throughout the procedure under my direct supervision. FLUOROSCOPY: Radiation Exposure Index (as provided by the fluoroscopic device): 3 mGy Kerma COMPLICATIONS: None PROCEDURE: Informed written consent was obtained from the patient after a discussion of the risks, benefits, and alternatives to treatment. Questions regarding the procedure were encouraged and answered. The right neck and chest were prepped with chlorhexidine in a sterile fashion, and a sterile drape was applied covering the operative field. Maximum barrier sterile technique with sterile gowns and gloves were used for the procedure. A timeout was performed prior to the initiation of the procedure. Ultrasound survey was performed. The right internal jugular vein was confirmed to be patent, with images stored and sent to PACS. Micropuncture kit was utilized to access the right internal jugular vein under direct, real-time ultrasound guidance after the overlying soft tissues were anesthetized with 1% lidocaine with epinephrine. Stab incision was made with 11 blade scalpel. Microwire was passed centrally. The microwire was then marked to  measure appropriate internal catheter length. External tunneled length was estimated. A total tip to  cuff length of 23 cm was selected. 035 guidewire was advanced to the level of the IVC. Skin and subcutaneous tissues of chest wall below the clavicle were generously infiltrated with 1% lidocaine for local anesthesia. A small stab incision was made with 11 blade scalpel. The selected hemodialysis catheter was tunneled in a retrograde fashion from the anterior chest wall to the venotomy incision. Serial dilation was performed and then a peel-away sheath was placed. The catheter was then placed through the peel-away sheath with tips ultimately positioned within the superior aspect of the right atrium. Final catheter positioning was confirmed and documented with a spot radiographic image. The catheter aspirates and flushes normally. The catheter was flushed with appropriate volume heparin dwells. The catheter exit site was secured with a 0-Prolene retention suture. Gel-Foam slurry was infused into the soft tissue tract. The venotomy incision was closed Derma bond and sterile dressing. Dressings were applied at the chest wall. Patient tolerated the procedure well and remained hemodynamically stable throughout. No complications were encountered and no significant blood loss encountered. IMPRESSION: Status post image guided right IJ tunneled hemodialysis catheter. Signed, Yvone Neu. Miachel Roux, RPVI [facility] Radiology Electronically Signed   By: Gilmer Mor D.O.   On: 02/12/2024 09:16   DG CHEST PORT 1 VIEW Result Date: 02/12/2024 CLINICAL DATA:  Respiratory distress EXAM: PORTABLE CHEST 1 VIEW COMPARISON:  Partial comparison to CT abdomen/pelvis dated 02/10/2024. Chest radiograph dated 12/31/2023. FINDINGS: Multifocal right lung opacities, raising concern for mild multifocal pneumonia. Superimposed right lower lobe atelectasis with layering small to moderate right pleural effusion. Mild patchy/retrocardiac opacity, favoring atelectasis when correlating with  prior CT. No pneumothorax. The heart is normal in size.  Thoracic aortic atherosclerosis. IMPRESSION: Multifocal right lung opacities, raising concern for mild multifocal pneumonia. Superimposed bilateral lower lobe atelectasis. Layering small to moderate right pleural effusion. Electronically Signed   By: Charline Bills M.D.   On: 02/12/2024 03:19   CT Renal Stone Study Result Date: 02/10/2024 CLINICAL DATA:  Flank pain, abnormal labs EXAM: CT ABDOMEN AND PELVIS WITHOUT CONTRAST TECHNIQUE: Multidetector CT imaging of the abdomen and pelvis was performed following the standard protocol without IV contrast. RADIATION DOSE REDUCTION: This exam was performed according to the departmental dose-optimization program which includes automated exposure control, adjustment of the mA and/or kV according to patient size and/or use of iterative reconstruction technique. COMPARISON:  01/26/2017 FINDINGS: Lower chest: Small bilateral pleural effusions. Associated compressive atelectasis in the bilateral lower lobes. Hepatobiliary: Scattered hepatic cysts measuring up to 2.9 cm in the posterior right hepatic lobe (series 2/image 14). Gallbladder is notable for tiny layering gallstones (series 2/image 22), without associated inflammatory changes. No intrahepatic or extrahepatic ductal dilatation. Pancreas: Within normal limits. Spleen: Within normal limits. Adrenals/Urinary Tract: Low-density 1.7 cm renal nodule, measuring less than 20 HUs, indeterminate but likely reflecting a benign adrenal adenoma. Right adrenal gland is within normal limits. Numerous simple bilateral renal cysts, measuring up to 4.2 cm in the lateral left upper kidney (series 2/image 28), benign (Bosniak I). No follow-up is recommended. 15 mm hyperdense lesion along the anterior right upper kidney, measuring chest under 70 HUs, technically indeterminate but unchanged from 2018, compatible with a benign hemorrhagic cyst (Bosniak II). No follow-up is  recommended. No hydronephrosis. Bladder is within normal limits. Stomach/Bowel: Stomach is within normal limits. No evidence of bowel obstruction. Normal appendix (series 2/image 40).  Mild left colonic diverticulosis, without evidence of diverticulitis. Vascular/Lymphatic: Aorta bi-iliac stents. Atherosclerotic calcifications of the abdominal aorta and branch vessels. Excluded 1.8 cm right common iliac artery aneurysm (series 2/image 56). No suspicious abdominopelvic lymphadenopathy. Reproductive: Mild prostatomegaly with dystrophic calcifications. Other: No abdominopelvic ascites. Musculoskeletal: Status post PLIF at L3-S1. Anterior lumbar fixation at L5-S1. IMPRESSION: Cholelithiasis, without associated inflammatory changes. Mild left colonic diverticulosis, without evidence of diverticulitis. Small bilateral pleural effusions. Additional ancillary findings as above. Aortic Atherosclerosis (ICD10-I70.0). Electronically Signed   By: Charline Bills M.D.   On: 02/10/2024 23:44   Scheduled Meds:  amLODipine  10 mg Oral QHS   Chlorhexidine Gluconate Cloth  6 each Topical Q0600   doxycycline  100 mg Oral Q12H   heparin  5,000 Units Subcutaneous Q8H   hydrALAZINE  25 mg Oral BID   insulin aspart  0-5 Units Subcutaneous QHS   insulin aspart  0-6 Units Subcutaneous TID WC   ipratropium-albuterol  3 mL Nebulization Q6H   levothyroxine  75 mcg Oral Daily   memantine  10 mg Oral BID   nicotine  7 mg Transdermal Daily   sodium bicarbonate  650 mg Oral BID   Continuous Infusions:  albuterol     anticoagulant sodium citrate     cefTRIAXone (ROCEPHIN)  IV 1 g (02/12/24 0406)    LOS: 1 day   Time spent: 50 minutes  Carollee Herter, DO  [facility]  02/12/2024, 3:20 PM

## 2024-02-12 NOTE — Progress Notes (Signed)
   02/12/24 0243  Vitals  BP (!) 180/72  MAP (mmHg) 103  BP Location Left Arm  BP Method Automatic  Patient Position (if appropriate) Lying  Pulse Rate 95  Pulse Rate Source Monitor  Resp (!) 24  MEWS COLOR  MEWS Score Color Green  Oxygen Therapy  SpO2 94 %  O2 Device Nasal Cannula  O2 Flow Rate (L/min) 6 L/min  MEWS Score  MEWS Temp 0  MEWS Systolic 0  MEWS Pulse 0  MEWS RR 1  MEWS LOC 0  MEWS Score 1    Rapid RN and Dr. Joneen Roach at bedside. STAT cxr and EKG done. Medications given as ordered - see rapid RN notes and emar.

## 2024-02-12 NOTE — Assessment & Plan Note (Addendum)
 02-12-2024 pt has been wheelchair dependent for about 9-10 years. After his back surgery. Able to transfer to wheelchair. Discussed with pt, wife and sister-in-law. 02-13-2024 chronic. 02-14-2024 chronic. DC plan is to home.  02-15-2024 stable. Chronic. Confirmed with wife that pt will be able to go home tomorrow.

## 2024-02-12 NOTE — Assessment & Plan Note (Addendum)
 02-12-2024 seen on CT renal stone 02-09-2025. I think his enlarging bilateral pleural effusions R > L is likely responsible for his acute respiratory failure with hypoxia rather than pneumonia. Will see if HD is able to remove some of the effusion after 2 HD session. If he is still on O2 after 2 HD session, may need to proceed with thoracentesis to remove effusions. 02-13-2024 on bedside thoracic U/S today by this writer, pt has mod/large right pleural effusion(estimated 750-131ml) and smaller left pleural effusion. Will order thoracentesis for tomorrow on right side. 02-14-2024 IR able to get his right thoracentesis done yesterday. As expected he has 1000 ml of pleural fluid removed. Appears to be transudate. Procal minimally elevated. Can change to po abx.  02-15-2024 stable. Left pleural effusion probably not large enough to make a difference in his breathing.

## 2024-02-12 NOTE — Progress Notes (Signed)
   02/12/24 0314  Vitals  BP (!) 170/62  MAP (mmHg) 93  BP Location Left Arm  BP Method Automatic  Patient Position (if appropriate) Lying  Pulse Rate 92  Resp (!) 24  MEWS COLOR  MEWS Score Color Green  Oxygen Therapy  SpO2 100 %  O2 Device Nasal Cannula (on continuous nex tx at this time)  O2 Flow Rate (L/min) 6 L/min  MEWS Score  MEWS Temp 0  MEWS Systolic 0  MEWS Pulse 0  MEWS RR 1  MEWS LOC 0  MEWS Score 1    One time IV hydralazine given as ordered.

## 2024-02-12 NOTE — Subjective & Objective (Addendum)
 Pt seen and examined.   Weaned to RA. Discussed with pt's wife and sister-in-law. Pt will be ready to DC tomorrow. Renal coordinator has found pt an outpatient HD slot.  at [facility] on TTS 11:55 am chair time.  Suppose to go to HD#3 today.  Told wife that plan is to DC to home tomorrow.

## 2024-02-12 NOTE — Progress Notes (Signed)
 Made MD aware BP 164/71 (95). Pt has returned from hemodialysis.

## 2024-02-12 NOTE — Assessment & Plan Note (Addendum)
 02-12-2024 defer to nephrology if he needs epo 02-13-2024 check CBC in AM.  02-14-2024 HgB 8.8 g/dl today. 02-15-2024 HgB 8.4 g/dl today.

## 2024-02-12 NOTE — Assessment & Plan Note (Addendum)
 02-12-2024 on po bicarb bid. 02-13-2024 serum CO2 of 18 today. On bid bicarb. CO2 was 16 yesterday. 02-14-2024 resolved with PO bicarb and HD. CO2 today was 23  02-15-2024 nephrology has stopped po bicarb. Acidosis will be managed with HD.

## 2024-02-12 NOTE — Progress Notes (Signed)
Pt is back from IR.

## 2024-02-12 NOTE — Assessment & Plan Note (Addendum)
 02-12-2024 stable. On namenda bid. Seems pretty appropriate today. Monitor for hospital acquired delirium. 02-13-2024 pt is AxOx4 today. 02-14-2024 stable. He is AxOx4 today.  02-15-2024 stable. Chronic.  02-16-2024 chronic.

## 2024-02-12 NOTE — Progress Notes (Signed)
 [facility] Progress Note    Assessment/ Plan:     AKI/CKD stage IV, likely progression to ESRD - Ruled out obstruction with CT renal stone study.  Losartan d/c'ed. Followed by Dr. Arrie Aran as an outpatient Cr up 11.27, BUN worsened. Plan for [facility] placement today w/ IR, appreciate assistance. HD#1 planned for today, slow start protocol. Tentative plan for HD#2 tomorrow Avoid nephrotoxic medications including NSAIDs and iodinated intravenous contrast exposure unless the latter is absolutely indicated.  Preferred narcotic agents for pain control are hydromorphone, fentanyl, and methadone. Morphine should not be used. Avoid Baclofen and avoid oral sodium phosphate and magnesium citrate based laxatives / bowel preps. Continue strict Input and Output monitoring. Will monitor the patient closely with you and intervene or adjust therapy as indicated by changes in clinical status/labs  Resp distress, COPD, concern for multifocal PNA. Bcx pending, abx per primary. Consider check resp panel? Hyperkalemia - due to #1.  Given insulin and D50, lokelma, and sodium bicarb. K 5.7 this am. Ordered lokelma 10g TID x 1 day, HD plan as above Metabolic acidosis - due to #1.  Start bicarb and follow.  Start bicarbonate 650 mg bid and follow. HD plan as above, can d/c nahco3 once stable on HD HTN - stable DM type 2 - per primary PVD - stable Anemia of CKD stage IV - last hgb 9.6. Recommend work up-will defer to primary service. Will check Fe panel to assess for Fe vs ESA needs  Subjective:   Patient seen and examined bedside. SOB overnight. CXR with concerns for multifocal PNA, solumedrol/nebs given, bcx drawn. Patient reports feeling better. Just reports a dry mouth. Going to IR, transport here to get him.   Objective:   BP (!) 157/54 (BP Location: Left Arm)   Pulse 86   Temp 98.4 F (36.9 C)   Resp 18   Ht 5\' 10"  (1.778 m)   Wt 82.1 kg   SpO2 95%   BMI 25.97 kg/m   Intake/Output Summary  (Last 24 hours) at 02/12/2024 0813 Last data filed at 02/12/2024 0601 Gross per 24 hour  Intake 1305.35 ml  Output 550 ml  Net 755.35 ml   Weight change:   Physical Exam: Gen: NAD CVS: RRR Resp: normal wob, no overt crackles/rales appreciated Abd: soft, nt/nd Ext: trace pitting edema b/l Les Neuro: awake, alert  Imaging: DG CHEST PORT 1 VIEW Result Date: 02/12/2024 CLINICAL DATA:  Respiratory distress EXAM: PORTABLE CHEST 1 VIEW COMPARISON:  Partial comparison to CT abdomen/pelvis dated 02/10/2024. Chest radiograph dated 12/31/2023. FINDINGS: Multifocal right lung opacities, raising concern for mild multifocal pneumonia. Superimposed right lower lobe atelectasis with layering small to moderate right pleural effusion. Mild patchy/retrocardiac opacity, favoring atelectasis when correlating with prior CT. No pneumothorax. The heart is normal in size.  Thoracic aortic atherosclerosis. IMPRESSION: Multifocal right lung opacities, raising concern for mild multifocal pneumonia. Superimposed bilateral lower lobe atelectasis. Layering small to moderate right pleural effusion. Electronically Signed   By: Charline Bills M.D.   On: 02/12/2024 03:19   CT Renal Stone Study Result Date: 02/10/2024 CLINICAL DATA:  Flank pain, abnormal labs EXAM: CT ABDOMEN AND PELVIS WITHOUT CONTRAST TECHNIQUE: Multidetector CT imaging of the abdomen and pelvis was performed following the standard protocol without IV contrast. RADIATION DOSE REDUCTION: This exam was performed according to the departmental dose-optimization program which includes automated exposure control, adjustment of the mA and/or kV according to patient size and/or use of iterative reconstruction technique. COMPARISON:  01/26/2017 FINDINGS: Lower  chest: Small bilateral pleural effusions. Associated compressive atelectasis in the bilateral lower lobes. Hepatobiliary: Scattered hepatic cysts measuring up to 2.9 cm in the posterior right hepatic lobe (series  2/image 14). Gallbladder is notable for tiny layering gallstones (series 2/image 22), without associated inflammatory changes. No intrahepatic or extrahepatic ductal dilatation. Pancreas: Within normal limits. Spleen: Within normal limits. Adrenals/Urinary Tract: Low-density 1.7 cm renal nodule, measuring less than 20 HUs, indeterminate but likely reflecting a benign adrenal adenoma. Right adrenal gland is within normal limits. Numerous simple bilateral renal cysts, measuring up to 4.2 cm in the lateral left upper kidney (series 2/image 28), benign (Bosniak I). No follow-up is recommended. 15 mm hyperdense lesion along the anterior right upper kidney, measuring chest under 70 HUs, technically indeterminate but unchanged from 2018, compatible with a benign hemorrhagic cyst (Bosniak II). No follow-up is recommended. No hydronephrosis. Bladder is within normal limits. Stomach/Bowel: Stomach is within normal limits. No evidence of bowel obstruction. Normal appendix (series 2/image 40). Mild left colonic diverticulosis, without evidence of diverticulitis. Vascular/Lymphatic: Aorta bi-iliac stents. Atherosclerotic calcifications of the abdominal aorta and branch vessels. Excluded 1.8 cm right common iliac artery aneurysm (series 2/image 56). No suspicious abdominopelvic lymphadenopathy. Reproductive: Mild prostatomegaly with dystrophic calcifications. Other: No abdominopelvic ascites. Musculoskeletal: Status post PLIF at L3-S1. Anterior lumbar fixation at L5-S1. IMPRESSION: Cholelithiasis, without associated inflammatory changes. Mild left colonic diverticulosis, without evidence of diverticulitis. Small bilateral pleural effusions. Additional ancillary findings as above. Aortic Atherosclerosis (ICD10-I70.0). Electronically Signed   By: Charline Bills M.D.   On: 02/10/2024 23:44    Labs: BMET Recent Labs  Lab 02/10/24 1920 02/11/24 0522 02/11/24 1513 02/12/24 0354  NA 136 139 135 137  K 5.9* 5.7* 5.6* 5.7*   CL 103 105 104 104  CO2 14* 16* 15* 16*  GLUCOSE 126* 121* 240* 166*  BUN 104* 104* 102* 114*  CREATININE 11.09* 10.30* 10.80* 11.27*  CALCIUM 8.3* 7.5* 7.9* 8.1*  PHOS  --  10.5* 10.6* 10.0*   CBC Recent Labs  Lab 02/10/24 1920 02/11/24 0522 02/11/24 1513  WBC 8.7 8.1 8.3  HGB 11.0* 8.9* 9.6*  HCT 35.4* 28.3* 30.3*  MCV 95.2 94.0 94.1  PLT 328 275 293    Medications:     amLODipine  10 mg Oral QHS   Chlorhexidine Gluconate Cloth  6 each Topical Q0600   heparin  5,000 Units Subcutaneous Q8H   hydrALAZINE  25 mg Oral BID   ipratropium-albuterol  3 mL Nebulization Q6H   levothyroxine  75 mcg Oral Daily   memantine  10 mg Oral BID   [START ON 02/13/2024] methylPREDNISolone (SOLU-MEDROL) injection  80 mg Intravenous Daily   nicotine  7 mg Transdermal Daily   sodium bicarbonate  650 mg Oral BID      Anthony Sar, MD [facility] 02/12/2024, 8:13 AM

## 2024-02-12 NOTE — Assessment & Plan Note (Addendum)
 02-12-2024 add SSI. ESRD scale. 02-13-2024 stable. 02-14-2024 stable. CBG acceptable ranges.  02-15-2024 stable. Chronic.  02-16-2024 CBG acceptable ranges

## 2024-02-13 ENCOUNTER — Inpatient Hospital Stay (HOSPITAL_COMMUNITY): Payer: Medicare PPO

## 2024-02-13 DIAGNOSIS — J9601 Acute respiratory failure with hypoxia: Secondary | ICD-10-CM | POA: Diagnosis not present

## 2024-02-13 DIAGNOSIS — N179 Acute kidney failure, unspecified: Secondary | ICD-10-CM | POA: Diagnosis not present

## 2024-02-13 DIAGNOSIS — E872 Acidosis, unspecified: Secondary | ICD-10-CM

## 2024-02-13 DIAGNOSIS — N186 End stage renal disease: Secondary | ICD-10-CM | POA: Diagnosis not present

## 2024-02-13 DIAGNOSIS — N185 Chronic kidney disease, stage 5: Secondary | ICD-10-CM | POA: Diagnosis not present

## 2024-02-13 DIAGNOSIS — J90 Pleural effusion, not elsewhere classified: Secondary | ICD-10-CM | POA: Diagnosis not present

## 2024-02-13 HISTORY — PX: IR THORACENTESIS ASP PLEURAL SPACE W/IMG GUIDE: IMG5380

## 2024-02-13 LAB — BODY FLUID CELL COUNT WITH DIFFERENTIAL
Eos, Fluid: 0 %
Lymphs, Fluid: 56 %
Monocyte-Macrophage-Serous Fluid: 36 % — ABNORMAL LOW (ref 50–90)
Neutrophil Count, Fluid: 7 % (ref 0–25)
Total Nucleated Cell Count, Fluid: 165 uL (ref 0–1000)

## 2024-02-13 LAB — RENAL FUNCTION PANEL
Albumin: 3 g/dL — ABNORMAL LOW (ref 3.5–5.0)
Anion gap: 17 — ABNORMAL HIGH (ref 5–15)
BUN: 95 mg/dL — ABNORMAL HIGH (ref 8–23)
CO2: 18 mmol/L — ABNORMAL LOW (ref 22–32)
Calcium: 8.7 mg/dL — ABNORMAL LOW (ref 8.9–10.3)
Chloride: 100 mmol/L (ref 98–111)
Creatinine, Ser: 9.16 mg/dL — ABNORMAL HIGH (ref 0.61–1.24)
GFR, Estimated: 5 mL/min — ABNORMAL LOW (ref >60)
Glucose, Bld: 114 mg/dL — ABNORMAL HIGH (ref 70–99)
Phosphorus: 9.3 mg/dL — ABNORMAL HIGH (ref 2.5–4.6)
Potassium: 5.1 mmol/L (ref 3.5–5.1)
Sodium: 135 mmol/L (ref 135–145)

## 2024-02-13 LAB — GLUCOSE, PLEURAL OR PERITONEAL FLUID: Glucose, Fluid: 127 mg/dL

## 2024-02-13 LAB — PROTEIN, PLEURAL OR PERITONEAL FLUID: Total protein, fluid: <3 g/dL

## 2024-02-13 LAB — LACTATE DEHYDROGENASE, PLEURAL OR PERITONEAL FLUID: LD, Fluid: 40 U/L — ABNORMAL HIGH (ref 3–23)

## 2024-02-13 LAB — ALBUMIN, PLEURAL OR PERITONEAL FLUID: Albumin, Fluid: <1.5 g/dL

## 2024-02-13 LAB — GLUCOSE, CAPILLARY
Glucose-Capillary: 113 mg/dL — ABNORMAL HIGH (ref 70–99)
Glucose-Capillary: 104 mg/dL — ABNORMAL HIGH (ref 70–99)
Glucose-Capillary: 93 mg/dL (ref 70–99)
Glucose-Capillary: 119 mg/dL — ABNORMAL HIGH (ref 70–99)

## 2024-02-13 MED ORDER — SEVELAMER CARBONATE 800 MG PO TABS
800.0000 mg | ORAL_TABLET | Freq: Three times a day (TID) | ORAL | Status: DC
  Administered 2024-02-13 – 2024-02-16 (×7): 800 mg via ORAL
  Filled 2024-02-13 (×7): qty 1

## 2024-02-13 MED ORDER — LIDOCAINE HCL 1 % IJ SOLN
20.0000 mL | Freq: Once | INTRAMUSCULAR | Status: AC
  Administered 2024-02-13: 10 mL

## 2024-02-13 MED ORDER — LIDOCAINE HCL 1 % IJ SOLN
INTRAMUSCULAR | Status: AC
  Filled 2024-02-13: qty 20

## 2024-02-13 MED ORDER — IRON SUCROSE 200 MG IVPB - SIMPLE MED
200.0000 mg | Status: DC
  Administered 2024-02-13 – 2024-02-15 (×2): 200 mg via INTRAVENOUS
  Filled 2024-02-13 (×2): qty 200

## 2024-02-13 NOTE — Progress Notes (Addendum)
 Patient called out to the nurses station complaining of shortness of breath. Patient's O2 sat 89% on 3L  [place]. HOB elevated to 30 degrees and oxygen increased to 4L [place]. Patient reported relief and is now satting 96%.

## 2024-02-13 NOTE — Anesthesia Preprocedure Evaluation (Signed)
 Anesthesia Evaluation  Patient identified by MRN, date of birth, ID band Patient awake    Reviewed: Allergy & Precautions, NPO status , Patient's Chart, lab work & pertinent test results  History of Anesthesia Complications Negative for: history of anesthetic complications  Airway Mallampati: II  TM Distance: >3 FB Neck ROM: Full    Dental  (+) Dental Advisory Given   Pulmonary asthma , COPD, Current Smoker and Patient abstained from smoking.  Pleural effusion s/p thoracocentesis 2/27 Subjectively, patient denies any SOB. Felt his breathing was at baseline even prior to the thoracocentesis    Pulmonary exam normal        Cardiovascular hypertension, Pt. on medications + CAD and + Peripheral Vascular Disease  Normal cardiovascular exam   Hx PEA arrest during back surgery  '24 Carotid US - 60-79% right ICAS, 1-39% left ICAS    Neuro/Psych  PSYCHIATRIC DISORDERS  Depression   Dementia  Vertigo  Neuromuscular disease    GI/Hepatic Neg liver ROS, hiatal hernia,GERD  ,,  Endo/Other  diabetes, Well Controlled, Type 2Hypothyroidism    Renal/GU ESRFRenal disease     Musculoskeletal  (+) Arthritis ,    Abdominal   Peds  Hematology negative hematology ROS (+)   Anesthesia Other Findings Chronic back pain   Reproductive/Obstetrics                              Anesthesia Physical Anesthesia Plan  ASA: 4  Anesthesia Plan: Regional   Post-op Pain Management: Regional block*   Induction:   PONV Risk Score and Plan: 1 and Propofol infusion and Treatment may vary due to age or medical condition  Airway Management Planned: Natural Airway and Simple Face Mask  Additional Equipment: None  Intra-op Plan:   Post-operative Plan:   Informed Consent: I have reviewed the patients History and Physical, chart, labs and discussed the procedure including the risks, benefits and alternatives for  the proposed anesthesia with the patient or authorized representative who has indicated his/her understanding and acceptance.       Plan Discussed with: CRNA and Anesthesiologist  Anesthesia Plan Comments:         Anesthesia Quick Evaluation

## 2024-02-13 NOTE — Progress Notes (Signed)
 Requested to see pt for out-pt HD needs at d/c. Met with pt at bedside this morning. Introduced self and explained role. Pt very pleasant. Pt lives in [place] and prefers clinic in [name]. Referral submitted to [facility] admissions this morning for review. Pt states that wife will assist with transportation to HD appts at d/c. Will assist as needed.   Olivia Canter Renal Navigator [phone]
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
## 2024-02-13 NOTE — Plan of Care (Signed)
  Problem: Clinical Measurements: Goal: Respiratory complications will improve Outcome: Progressing   Problem: Activity: Goal: Risk for activity intolerance will decrease Outcome: Progressing   Problem: Pain Managment: Goal: General experience of comfort will improve and/or be controlled Outcome: Progressing   Problem: Safety: Goal: Ability to remain free from injury will improve Outcome: Progressing   Problem: Metabolic: Goal: Ability to maintain appropriate glucose levels will improve Outcome: Progressing   Problem: Nutritional: Goal: Maintenance of adequate nutrition will improve Outcome: Progressing

## 2024-02-13 NOTE — Progress Notes (Signed)
 [facility] Progress Note    Assessment/ Plan:     AKI/CKD stage IV, now ESRD - Ruled out obstruction with CT renal stone study.  Losartan d/c'ed. Followed by Dr. Arrie Aran as an outpatient Started HD given worsening labs and constellation of symptoms concerning for uremia. TDC w/ IR and HD#1 2/26. Tentative plan for HD#2 today and then #3 Saturday (tentatively) Consulted [facility]VS for AVF/AVG creation while he's here CLIP in process Avoid nephrotoxic medications including NSAIDs and iodinated intravenous contrast exposure unless the latter is absolutely indicated.  Preferred narcotic agents for pain control are hydromorphone, fentanyl, and methadone. Morphine should not be used. Avoid Baclofen and avoid oral sodium phosphate and magnesium citrate based laxatives / bowel preps. Continue strict Input and Output monitoring. Will monitor the patient closely with you and intervene or adjust therapy as indicated by changes in clinical status/labs  Resp distress, COPD, concern for multifocal PNA. Bcx pending, abx per primary. Resp panel neg Hyperkalemia - due to #1. Improved, continue to monitor Metabolic acidosis - due to #1.  Stopping PO bicarb, will manage with HD HTN - stable, UF  astolerated DM type 2 - per primary PVD - stable Anemia of CKD - last hgb 9.6. Recommend work up-will defer to primary service. Will start Fe load CKD MBD: starting renvela, renal diet, checking PTH  Subjective:   Patient seen and examined bedside. Tolerated HD. Has some SOB which has been ongoing. Net uf 2L   Objective:   BP (!) 149/68 (BP Location: Right Arm)   Pulse 91   Temp 98 F (36.7 C)   Resp 20   Ht 5\' 10"  (1.778 m)   Wt (S) 86.5 kg Comment: Bed Scale  SpO2 90%   BMI 27.36 kg/m   Intake/Output Summary (Last 24 hours) at 02/13/2024 1033 Last data filed at 02/13/2024 0407 Gross per 24 hour  Intake 120 ml  Output 2300 ml  Net -2180 ml   Weight change:   Physical Exam: Gen:  NAD CVS: RRR Resp: end exp wheezing, no crackles appreciated, normal wob Abd: soft, nt/nd Ext: trace pitting edema b/l Les Neuro: awake, alert Dialysis access: right [facility]  Imaging: IR Fluoro Guide CV Line Right Result Date: 02/12/2024 INDICATION: 78 year old male referred for hemodialysis catheter placement EXAM: TUNNELED CENTRAL VENOUS HEMODIALYSIS CATHETER PLACEMENT WITH ULTRASOUND AND FLUOROSCOPIC GUIDANCE MEDICATIONS: 2 g Ancef. The antibiotic was given in an appropriate time interval prior to skin puncture. ANESTHESIA/SEDATION: Moderate (conscious) sedation was employed during this procedure. A total of Versed 1.0 mg and Fentanyl 50 mcg was administered intravenously by the radiology nurse. Total intra-service moderate Sedation Time: 15 minutes. The patient's level of consciousness and vital signs were monitored continuously by radiology nursing throughout the procedure under my direct supervision. FLUOROSCOPY: Radiation Exposure Index (as provided by the fluoroscopic device): 3 mGy Kerma COMPLICATIONS: None PROCEDURE: Informed written consent was obtained from the patient after a discussion of the risks, benefits, and alternatives to treatment. Questions regarding the procedure were encouraged and answered. The right neck and chest were prepped with chlorhexidine in a sterile fashion, and a sterile drape was applied covering the operative field. Maximum barrier sterile technique with sterile gowns and gloves were used for the procedure. A timeout was performed prior to the initiation of the procedure. Ultrasound survey was performed. The right internal jugular vein was confirmed to be patent, with images stored and sent to PACS. Micropuncture kit was utilized to access the right internal jugular vein under direct, real-time  ultrasound guidance after the overlying soft tissues were anesthetized with 1% lidocaine with epinephrine. Stab incision was made with 11 blade scalpel. Microwire was passed  centrally. The microwire was then marked to measure appropriate internal catheter length. External tunneled length was estimated. A total tip to cuff length of 23 cm was selected. 035 guidewire was advanced to the level of the IVC. Skin and subcutaneous tissues of chest wall below the clavicle were generously infiltrated with 1% lidocaine for local anesthesia. A small stab incision was made with 11 blade scalpel. The selected hemodialysis catheter was tunneled in a retrograde fashion from the anterior chest wall to the venotomy incision. Serial dilation was performed and then a peel-away sheath was placed. The catheter was then placed through the peel-away sheath with tips ultimately positioned within the superior aspect of the right atrium. Final catheter positioning was confirmed and documented with a spot radiographic image. The catheter aspirates and flushes normally. The catheter was flushed with appropriate volume heparin dwells. The catheter exit site was secured with a 0-Prolene retention suture. Gel-Foam slurry was infused into the soft tissue tract. The venotomy incision was closed Derma bond and sterile dressing. Dressings were applied at the chest wall. Patient tolerated the procedure well and remained hemodynamically stable throughout. No complications were encountered and no significant blood loss encountered. IMPRESSION: Status post image guided right IJ tunneled hemodialysis catheter. Signed, Yvone Neu. Miachel Roux, RPVI [facility] Electronically Signed   By: Gilmer Mor D.O.   On: 02/12/2024 09:16   IR US Guide Vasc Access Right Result Date: 02/12/2024 INDICATION: 78 year old male referred for hemodialysis catheter placement EXAM: TUNNELED CENTRAL VENOUS HEMODIALYSIS CATHETER PLACEMENT WITH ULTRASOUND AND FLUOROSCOPIC GUIDANCE MEDICATIONS: 2 g Ancef. The antibiotic was given in an appropriate time interval prior to skin puncture.  ANESTHESIA/SEDATION: Moderate (conscious) sedation was employed during this procedure. A total of Versed 1.0 mg and Fentanyl 50 mcg was administered intravenously by the radiology nurse. Total intra-service moderate Sedation Time: 15 minutes. The patient's level of consciousness and vital signs were monitored continuously by radiology nursing throughout the procedure under my direct supervision. FLUOROSCOPY: Radiation Exposure Index (as provided by the fluoroscopic device): 3 mGy Kerma COMPLICATIONS: None PROCEDURE: Informed written consent was obtained from the patient after a discussion of the risks, benefits, and alternatives to treatment. Questions regarding the procedure were encouraged and answered. The right neck and chest were prepped with chlorhexidine in a sterile fashion, and a sterile drape was applied covering the operative field. Maximum barrier sterile technique with sterile gowns and gloves were used for the procedure. A timeout was performed prior to the initiation of the procedure. Ultrasound survey was performed. The right internal jugular vein was confirmed to be patent, with images stored and sent to PACS. Micropuncture kit was utilized to access the right internal jugular vein under direct, real-time ultrasound guidance after the overlying soft tissues were anesthetized with 1% lidocaine with epinephrine. Stab incision was made with 11 blade scalpel. Microwire was passed centrally. The microwire was then marked to measure appropriate internal catheter length. External tunneled length was estimated. A total tip to cuff length of 23 cm was selected. 035 guidewire was advanced to the level of the IVC. Skin and subcutaneous tissues of chest wall below the clavicle were generously infiltrated with 1% lidocaine for local anesthesia. A small stab incision was made with 11 blade scalpel. The selected hemodialysis catheter was tunneled in a retrograde fashion from the anterior  chest wall to the venotomy  incision. Serial dilation was performed and then a peel-away sheath was placed. The catheter was then placed through the peel-away sheath with tips ultimately positioned within the superior aspect of the right atrium. Final catheter positioning was confirmed and documented with a spot radiographic image. The catheter aspirates and flushes normally. The catheter was flushed with appropriate volume heparin dwells. The catheter exit site was secured with a 0-Prolene retention suture. Gel-Foam slurry was infused into the soft tissue tract. The venotomy incision was closed Derma bond and sterile dressing. Dressings were applied at the chest wall. Patient tolerated the procedure well and remained hemodynamically stable throughout. No complications were encountered and no significant blood loss encountered. IMPRESSION: Status post image guided right IJ tunneled hemodialysis catheter. Signed, Yvone Neu. Miachel Roux, RPVI [facility] Electronically Signed   By: Gilmer Mor D.O.   On: 02/12/2024 09:16   DG CHEST PORT 1 VIEW Result Date: 02/12/2024 CLINICAL DATA:  Respiratory distress EXAM: PORTABLE CHEST 1 VIEW COMPARISON:  Partial comparison to CT abdomen/pelvis dated 02/10/2024. Chest radiograph dated 12/31/2023. FINDINGS: Multifocal right lung opacities, raising concern for mild multifocal pneumonia. Superimposed right lower lobe atelectasis with layering small to moderate right pleural effusion. Mild patchy/retrocardiac opacity, favoring atelectasis when correlating with prior CT. No pneumothorax. The heart is normal in size.  Thoracic aortic atherosclerosis. IMPRESSION: Multifocal right lung opacities, raising concern for mild multifocal pneumonia. Superimposed bilateral lower lobe atelectasis. Layering small to moderate right pleural effusion. Electronically Signed   By: Charline Bills M.D.   On: 02/12/2024 03:19    Labs: BMET Recent Labs  Lab  02/10/24 1920 02/11/24 0522 02/11/24 1513 02/12/24 0354 02/13/24 0712  NA 136 139 135 137 135  K 5.9* 5.7* 5.6* 5.7* 5.1  CL 103 105 104 104 100  CO2 14* 16* 15* 16* 18*  GLUCOSE 126* 121* 240* 166* 114*  BUN 104* 104* 102* 114* 95*  CREATININE 11.09* 10.30* 10.80* 11.27* 9.16*  CALCIUM 8.3* 7.5* 7.9* 8.1* 8.7*  PHOS  --  10.5* 10.6* 10.0* 9.3*   CBC Recent Labs  Lab 02/10/24 1920 02/11/24 0522 02/11/24 1513  WBC 8.7 8.1 8.3  HGB 11.0* 8.9* 9.6*  HCT 35.4* 28.3* 30.3*  MCV 95.2 94.0 94.1  PLT 328 275 293    Medications:     amLODipine  10 mg Oral QHS   Chlorhexidine Gluconate Cloth  6 each Topical Q0600   doxycycline  100 mg Oral Q12H   heparin  5,000 Units Subcutaneous Q8H   hydrALAZINE  25 mg Oral BID   insulin aspart  0-5 Units Subcutaneous QHS   insulin aspart  0-6 Units Subcutaneous TID WC   ipratropium-albuterol  3 mL Nebulization Q6H   levothyroxine  75 mcg Oral Daily   memantine  10 mg Oral BID   nicotine  7 mg Transdermal Daily   sevelamer carbonate  800 mg Oral TID WC   sodium bicarbonate  650 mg Oral BID      Anthony Sar, MD [facility] 02/13/2024, 10:33 AM

## 2024-02-13 NOTE — Procedures (Signed)
 Ultrasound-guided diagnostic and therapeutic right sided thoracentesis performed yielding 1 liter of straw  colored fluid. No immediate complications.   Diagnostic fluid was sent to the lab for further analysis. Follow-up chest x-ray pending. EBL is < 2 ml.

## 2024-02-13 NOTE — Progress Notes (Signed)
 PROGRESS NOTE    Joseph Hebert.  YQI:[id number] DOB: 02/17/1946 DOA: 02/10/2024 PCP: Paulina Fusi, MD  Subjective: Pt seen and examined.   Still on O2. Has spells where he is very short winded. Has to remain sitting nearly upright.  Had HD session yesterday that removed 2 liters of fluid.  Bedside thoracic U/S shows moderate sized right pleural effusion and smaller left pleural effusion.  Remains on 4 L/min.  Afebrile.  Blood cx are negative thus far.  Pt is agreeable to thoracentesis tomorrow.   Hospital Course: HPI: [name]. is a 78 y.o. male with medical history significant for DM which does not require medication, htn and ESRD.  The patient does not know what his baseline creatinine is but he tells me that the nephrologist have been talking to him about the possibility of hemodialysis for his last couple of visits he had a routine visit today and had blood work.  He says he went home and was called on the phone and told to come to the emergency room right away.  Patient has been unable to urinate all day today despite trying.  He denies any chest pain or shortness of breath or fevers or chills.  In the emergency department the patient's blood work revealed a creatinine of 11 and a potassium of 5.9.  The bladder scan revealed only 250 mL of urine and the patient was unable to urinate.  Urology was called from the emergency department.  He will be admitted likely will require initiation of HD during this hospital stay.  Significant Events: Admitted 02/10/2024 acute on CKD stage 5(not on HD)   Significant Labs: WBC 8.7, HgB 11.0, plt 328 Na 136, K 5.9, CO2 of 14, BUN 104, scr 11.09, glu 126  Significant Imaging Studies: CT renal stone Cholelithiasis, without associated inflammatory changes. Mild left colonic diverticulosis, without evidence of diverticulitis. Small bilateral pleural effusions. Additional ancillary findings as above.Aortic Atherosclerosis  CXR  Multifocal right lung opacities, raising concern for mild multifocal pneumonia. Superimposed bilateral lower lobe atelectasis. Layering small to moderate right pleural effusion.  Antibiotic Therapy: Anti-infectives (From admission, onward)    Start     Dose/Rate Route Frequency Ordered Stop   02/12/24 1000  ceFAZolin (ANCEF) IVPB 2g/100 mL premix       Note to Pharmacy: Admin in IR only   2 g 200 mL/hr over 30 Minutes Intravenous  Once 02/11/24 1450     02/12/24 0400  doxycycline (VIBRAMYCIN) 100 mg in sodium chloride 0.9 % 250 mL IVPB        100 mg 125 mL/hr over 120 Minutes Intravenous 2 times daily 02/12/24 0258     02/12/24 0300  cefTRIAXone (ROCEPHIN) 1 g in sodium chloride 0.9 % 100 mL IVPB        1 g 200 mL/hr over 30 Minutes Intravenous Daily at bedtime 02/12/24 0258        Procedures: 02-12-2024 right HD catheter placement  Consultants: Nephrology    Assessment and Plan: * Acute renal failure superimposed on stage 5 chronic kidney disease, not on chronic dialysis (HCC) 02-12-2024 seen by nephrology. See "ESRD on dialysis"  ESRD (end stage renal disease) on dialysis (HCC) 02-12-2024 had HD catheter placed today. Nephrology planning back-to-back HD sessions.  Pt lives in [place]. Will need renal coordinator to start outpt HD slot search.  02-13-2024 dialysis coordinator has started outpatient HD slot search  Acute respiratory failure with hypoxia (HCC) 02-12-2024 events of last night  noted.  Now on 5 L/min supplemental O2. Not on home O2. Had bilateral pleural effusions on CT renal stone 02-10-2024.  Flu/covid/RSV negative. Checking RVP.  No fevers. No leukocytosis.  Procal only mildly elevated. Don't think this is really pneumonia. Most likely volume overload from ESRD. Hopefully HD will help this that. Appears pt is going to have back-to-back HD session over next 2 days. Will re-evaluate pleural effusion. May need thoracentesis after 2 HD sessions. Discussed pt, wife and  sister-in-law. Continue abx for now.  02-13-2024 remains on 4 L/min. No fevers. Pt with moderate/large right pleural effusion. My estimates are about [phone] ml of pleural fluid on the right side. Will send pt to U/S tomorrow for right side thoracentesis. Pt is agreeable. He is not on any systemic anticoagulants. Still don't think he has pneumonia. Repeat CBC and procal in AM. Will probably stop IV abx tomorrow.  Metabolic acidosis 02-12-2024 on po bicarb bid.  02-13-2024 serum CO2 of 18 today. On bid bicarb. CO2 was 16 yesterday.  Hyperkalemia 02-12-2024 give po lokelma  02-13-2024 K 5.1 today. Pt going to HD again today.  Bilateral pleural effusion 02-12-2024 seen on CT renal stone 02-09-2025. I think his enlarging bilateral pleural effusions R > L is likely responsible for his acute respiratory failure with hypoxia rather than pneumonia. Will see if HD is able to remove some of the effusion after 2 HD session. If he is still on O2 after 2 HD session, may need to proceed with thoracentesis to remove effusions.  02-13-2024 on bedside thoracic U/S today by this writer, pt has mod/large right pleural effusion(estimated 750-171ml) and smaller left pleural effusion. Will order thoracentesis for tomorrow on right side.  Essential hypertension 02-12-2024 on hydralazine 25 mg bid, norvasc 10 mg at bedtime.. Will need to see how his BP reacts to HD.  02-13-2024 s/p 2 liters removed with UF yesterday. Going for HD session #2 today.  Continue to monitor BP. Hold off on adding any further HTN meds until he is euvolemic on his volume status.  Dependent for wheelchair mobility 02-12-2024 pt has been wheelchair dependent for about 9-10 years. After his back surgery. Able to transfer to wheelchair. Discussed with pt, wife and sister-in-law.  02-13-2024 chronic.  Diabetes mellitus without complication (HCC) 02-12-2024 add SSI. ESRD scale.  02-13-2024 stable.  Dementia (HCC) 02-12-2024 stable. On  namenda bid. Seems pretty appropriate today. Monitor for hospital acquired delirium.  02-13-2024 pt is AxOx4 today.  COPD (chronic obstructive pulmonary disease) (HCC) 02-12-2024 stable. Not wheezing. No exacerbation. Checking RVP to make sure he doesn't have any other viral illnesses.[name] RVP/flu/covid is negative. Stop steroids.  02-13-2024 pt is not wheezing. His COPD is not exacerbated.  History of anemia due to chronic kidney disease 02-12-2024 defer to nephrology if he needs epo  02-13-2024 check CBC in AM.   DVT prophylaxis: heparin injection 5,000 Units Start: 02/11/24 1400    Code Status: Full Code Family Communication: no family at bedside Disposition Plan: return home Reason for continuing need for hospitalization: going to HD today. Thoracentesis ordered for tomorrow.  Objective: Vitals:   02/13/24 0412 02/13/24 0726 02/13/24 0848 02/13/24 1019  BP: (!) 143/58 (!) 149/68    Pulse: 90 91 86 91  Resp: 18 18 20    Temp: (!) 97.5 F (36.4 C) 98 F (36.7 C)    TempSrc: Oral     SpO2: 92% (!) 89% 90%   Weight:      Height:  Intake/Output Summary (Last 24 hours) at 02/13/2024 1326 Last data filed at 02/13/2024 0407 Gross per 24 hour  Intake 120 ml  Output 2300 ml  Net -2180 ml   Filed Weights   02/10/24 1903 02/12/24 1551 02/12/24 1826  Weight: 82.1 kg 87.4 kg (S) 86.5 kg    Examination:  Physical Exam Vitals and nursing note reviewed.  Constitutional:      General: He is not in acute distress.    Appearance: He is not ill-appearing, toxic-appearing or diaphoretic.  HENT:     Head: Normocephalic and atraumatic.     Nose: Nose normal.  Cardiovascular:     Rate and Rhythm: Normal rate and regular rhythm.  Pulmonary:     Effort: Pulmonary effort is normal.     Breath sounds: No wheezing.     Comments: Bedside thoracic U/S shows moderate sized right pleural effusion and smaller left pleural effusion. Abdominal:     General: Bowel sounds are normal.      Palpations: Abdomen is soft.  Musculoskeletal:     Right lower leg: Edema present.     Left lower leg: Edema present.     Comments: Trace pretibial edema bilaterally  Skin:    General: Skin is warm and dry.     Capillary Refill: Capillary refill takes less than 2 seconds.  Neurological:     Mental Status: He is alert and oriented to person, place, and time.     Data Reviewed: I have personally reviewed following labs and imaging studies  CBC: Recent Labs  Lab 02/10/24 1920 02/11/24 0522 02/11/24 1513  WBC 8.7 8.1 8.3  HGB 11.0* 8.9* 9.6*  HCT 35.4* 28.3* 30.3*  MCV 95.2 94.0 94.1  PLT 328 275 293   Basic Metabolic Panel: Recent Labs  Lab 02/10/24 1920 02/11/24 0522 02/11/24 1513 02/12/24 0343 02/12/24 0354 02/13/24 0712  NA 136 139 135  --  137 135  K 5.9* 5.7* 5.6*  --  5.7* 5.1  CL 103 105 104  --  104 100  CO2 14* 16* 15*  --  16* 18*  GLUCOSE 126* 121* 240*  --  166* 114*  BUN 104* 104* 102*  --  114* 95*  CREATININE 11.09* 10.30* 10.80*  --  11.27* 9.16*  CALCIUM 8.3* 7.5* 7.9*  --  8.1* 8.7*  MG  --   --   --  2.1  --   --   PHOS  --  10.5* 10.6*  --  10.0* 9.3*   GFR: Estimated Creatinine Clearance: 6.9 mL/min (A) (by C-G formula based on SCr of 9.16 mg/dL (H)). Liver Function Tests: Recent Labs  Lab 02/10/24 1920 02/11/24 0522 02/11/24 1513 02/12/24 0354 02/13/24 0712  AST 14* 10*  --   --   --   ALT 13 13  --   --   --   ALKPHOS 80 61  --   --   --   BILITOT 0.4 0.6  --   --   --   PROT 6.8 5.5*  --   --   --   ALBUMIN 3.3* 2.6* 2.9* 3.0* 3.0*   Recent Labs  Lab 02/10/24 1920  LIPASE 51   HbA1C: Recent Labs    02/12/24 1948  HGBA1C 5.3   CBG: Recent Labs  Lab 02/12/24 0737 02/12/24 1141 02/12/24 2009 02/13/24 0723 02/13/24 1222  GLUCAP 192* 171* 149* 113* 104*   Anemia Panel: Recent Labs    02/12/24 0343  FERRITIN 81  TIBC 273  IRON 41*   Sepsis Labs: Recent Labs  Lab 02/12/24 0343  PROCALCITON 0.17     Recent Results (from the past 240 hours)  MRSA Next Gen by PCR, Nasal     Status: None   Collection Time: 02/12/24  2:59 AM   Specimen: Nasal Mucosa; Nasal Swab  Result Value Ref Range Status   MRSA by PCR Next Gen NOT DETECTED NOT DETECTED Final    Comment: (NOTE) The GeneXpert MRSA Assay (FDA approved for NASAL specimens only), is one component of a comprehensive MRSA colonization surveillance program. It is not intended to diagnose MRSA infection nor to guide or monitor treatment for MRSA infections. Test performance is not FDA approved in patients less than 63 years old. Performed at [facility], [address], [place], [place] [address]   Culture, blood (Routine X 2) w Reflex to ID Panel     Status: None (Preliminary result)   Collection Time: 02/12/24  3:43 AM   Specimen: BLOOD  Result Value Ref Range Status   Specimen Description BLOOD BLOOD LEFT ARM  Final   Special Requests   Final    BOTTLES DRAWN AEROBIC AND ANAEROBIC Blood Culture results may not be optimal due to an inadequate volume of blood received in culture bottles   Culture   Final    NO GROWTH 1 DAY Performed at [facility], [address], [place], [place] [address]    Report Status PENDING  Incomplete  Culture, blood (Routine X 2) w Reflex to ID Panel     Status: None (Preliminary result)   Collection Time: 02/12/24  3:54 AM   Specimen: BLOOD  Result Value Ref Range Status   Specimen Description BLOOD BLOOD RIGHT HAND  Final   Special Requests   Final    BOTTLES DRAWN AEROBIC AND ANAEROBIC Blood Culture adequate volume   Culture   Final    NO GROWTH 1 DAY Performed at [facility], [address], [place], [place] [address]    Report Status PENDING  Incomplete  Resp panel by RT-PCR (RSV, Flu A&B, Covid) Anterior Nasal Swab     Status: None   Collection Time: 02/12/24 10:48 AM   Specimen: Anterior Nasal Swab  Result Value Ref Range Status   SARS Coronavirus 2 by RT PCR  NEGATIVE NEGATIVE Final   Influenza A by PCR NEGATIVE NEGATIVE Final   Influenza B by PCR NEGATIVE NEGATIVE Final    Comment: (NOTE) The Xpert Xpress SARS-CoV-2/FLU/RSV plus assay is intended as an aid in the diagnosis of influenza from Nasopharyngeal swab specimens and should not be used as a sole basis for treatment. Nasal washings and aspirates are unacceptable for Xpert Xpress SARS-CoV-2/FLU/RSV testing.  Fact Sheet for Patients: BloggerCourse.com  Fact Sheet for Healthcare Providers: SeriousBroker.it  This test is not yet approved or cleared by the [place] FDA and has been authorized for detection and/or diagnosis of SARS-CoV-2 by FDA under an Emergency Use Authorization (EUA). This EUA will remain in effect (meaning this test can be used) for the duration of the COVID-19 declaration under Section 564(b)(1) of the Act, 21 U.S.C. section 360bbb-3(b)(1), unless the authorization is terminated or revoked.     Resp Syncytial Virus by PCR NEGATIVE NEGATIVE Final    Comment: (NOTE) Fact Sheet for Patients: BloggerCourse.com  Fact Sheet for Healthcare Providers: SeriousBroker.it  This test is not yet approved or cleared by the [place] and has been authorized  for detection and/or diagnosis of SARS-CoV-2 by FDA under an Emergency Use Authorization (EUA). This EUA will remain in effect (meaning this test can be used) for the duration of the COVID-19 declaration under Section 564(b)(1) of the Act, 21 U.S.C. section 360bbb-3(b)(1), unless the authorization is terminated or revoked.  Performed at [name], [address] [place], [place] [address]   Respiratory (~20 pathogens) panel by PCR     Status: None   Collection Time: 02/12/24  2:30 PM   Specimen: Nasopharyngeal Swab; Respiratory  Result Value Ref Range Status   Adenovirus NOT DETECTED NOT  DETECTED Final   Coronavirus 229E NOT DETECTED NOT DETECTED Final    Comment: (NOTE) The Coronavirus on the Respiratory Panel, DOES NOT test for the novel  Coronavirus (2019 nCoV)    Coronavirus HKU1 NOT DETECTED NOT DETECTED Final   Coronavirus NL63 NOT DETECTED NOT DETECTED Final   Coronavirus OC43 NOT DETECTED NOT DETECTED Final   Metapneumovirus NOT DETECTED NOT DETECTED Final   Rhinovirus / Enterovirus NOT DETECTED NOT DETECTED Final   Influenza A NOT DETECTED NOT DETECTED Final   Influenza B NOT DETECTED NOT DETECTED Final   Parainfluenza Virus 1 NOT DETECTED NOT DETECTED Final   Parainfluenza Virus 2 NOT DETECTED NOT DETECTED Final   Parainfluenza Virus 3 NOT DETECTED NOT DETECTED Final   Parainfluenza Virus 4 NOT DETECTED NOT DETECTED Final   Respiratory Syncytial Virus NOT DETECTED NOT DETECTED Final   Bordetella pertussis NOT DETECTED NOT DETECTED Final   Bordetella Parapertussis NOT DETECTED NOT DETECTED Final   Chlamydophila pneumoniae NOT DETECTED NOT DETECTED Final   Mycoplasma pneumoniae NOT DETECTED NOT DETECTED Final    Comment: Performed at [facility], [address], [place], [place] [address]     Radiology Studies: IR Fluoro Guide CV Line Right Result Date: 02/12/2024 INDICATION: 78 year old male referred for hemodialysis catheter placement EXAM: TUNNELED CENTRAL VENOUS HEMODIALYSIS CATHETER PLACEMENT WITH ULTRASOUND AND FLUOROSCOPIC GUIDANCE MEDICATIONS: 2 g Ancef. The antibiotic was given in an appropriate time interval prior to skin puncture. ANESTHESIA/SEDATION: Moderate (conscious) sedation was employed during this procedure. A total of Versed 1.0 mg and Fentanyl 50 mcg was administered intravenously by the radiology nurse. Total intra-service moderate Sedation Time: 15 minutes. The patient's level of consciousness and vital signs were monitored continuously by radiology nursing throughout the procedure under my direct supervision. FLUOROSCOPY: Radiation  Exposure Index (as provided by the fluoroscopic device): 3 mGy Kerma COMPLICATIONS: None PROCEDURE: Informed written consent was obtained from the patient after a discussion of the risks, benefits, and alternatives to treatment. Questions regarding the procedure were encouraged and answered. The right neck and chest were prepped with chlorhexidine in a sterile fashion, and a sterile drape was applied covering the operative field. Maximum barrier sterile technique with sterile gowns and gloves were used for the procedure. A timeout was performed prior to the initiation of the procedure. Ultrasound survey was performed. The right internal jugular vein was confirmed to be patent, with images stored and sent to PACS. Micropuncture kit was utilized to access the right internal jugular vein under direct, real-time ultrasound guidance after the overlying soft tissues were anesthetized with 1% lidocaine with epinephrine. Stab incision was made with 11 blade scalpel. Microwire was passed centrally. The microwire was then marked to measure appropriate internal catheter length. External tunneled length was estimated. A total tip to cuff length of 23 cm was selected. 035 guidewire was advanced to the level of the IVC.  Skin and subcutaneous tissues of chest wall below the clavicle were generously infiltrated with 1% lidocaine for local anesthesia. A small stab incision was made with 11 blade scalpel. The selected hemodialysis catheter was tunneled in a retrograde fashion from the anterior chest wall to the venotomy incision. Serial dilation was performed and then a peel-away sheath was placed. The catheter was then placed through the peel-away sheath with tips ultimately positioned within the superior aspect of the right atrium. Final catheter positioning was confirmed and documented with a spot radiographic image. The catheter aspirates and flushes normally. The catheter was flushed with appropriate volume heparin dwells. The  catheter exit site was secured with a 0-Prolene retention suture. Gel-Foam slurry was infused into the soft tissue tract. The venotomy incision was closed Derma bond and sterile dressing. Dressings were applied at the chest wall. Patient tolerated the procedure well and remained hemodynamically stable throughout. No complications were encountered and no significant blood loss encountered. IMPRESSION: Status post image guided right IJ tunneled hemodialysis catheter. Signed, Yvone Neu. Miachel Roux, RPVI [facility] Radiology Electronically Signed   By: Gilmer Mor D.O.   On: 02/12/2024 09:16   IR US Guide Vasc Access Right Result Date: 02/12/2024 INDICATION: 78 year old male referred for hemodialysis catheter placement EXAM: TUNNELED CENTRAL VENOUS HEMODIALYSIS CATHETER PLACEMENT WITH ULTRASOUND AND FLUOROSCOPIC GUIDANCE MEDICATIONS: 2 g Ancef. The antibiotic was given in an appropriate time interval prior to skin puncture. ANESTHESIA/SEDATION: Moderate (conscious) sedation was employed during this procedure. A total of Versed 1.0 mg and Fentanyl 50 mcg was administered intravenously by the radiology nurse. Total intra-service moderate Sedation Time: 15 minutes. The patient's level of consciousness and vital signs were monitored continuously by radiology nursing throughout the procedure under my direct supervision. FLUOROSCOPY: Radiation Exposure Index (as provided by the fluoroscopic device): 3 mGy Kerma COMPLICATIONS: None PROCEDURE: Informed written consent was obtained from the patient after a discussion of the risks, benefits, and alternatives to treatment. Questions regarding the procedure were encouraged and answered. The right neck and chest were prepped with chlorhexidine in a sterile fashion, and a sterile drape was applied covering the operative field. Maximum barrier sterile technique with sterile gowns and gloves were used for the procedure. A  timeout was performed prior to the initiation of the procedure. Ultrasound survey was performed. The right internal jugular vein was confirmed to be patent, with images stored and sent to PACS. Micropuncture kit was utilized to access the right internal jugular vein under direct, real-time ultrasound guidance after the overlying soft tissues were anesthetized with 1% lidocaine with epinephrine. Stab incision was made with 11 blade scalpel. Microwire was passed centrally. The microwire was then marked to measure appropriate internal catheter length. External tunneled length was estimated. A total tip to cuff length of 23 cm was selected. 035 guidewire was advanced to the level of the IVC. Skin and subcutaneous tissues of chest wall below the clavicle were generously infiltrated with 1% lidocaine for local anesthesia. A small stab incision was made with 11 blade scalpel. The selected hemodialysis catheter was tunneled in a retrograde fashion from the anterior chest wall to the venotomy incision. Serial dilation was performed and then a peel-away sheath was placed. The catheter was then placed through the peel-away sheath with tips ultimately positioned within the superior aspect of the right atrium. Final catheter positioning was confirmed and documented with a spot radiographic image. The catheter aspirates and flushes normally. The catheter was flushed with appropriate volume  heparin dwells. The catheter exit site was secured with a 0-Prolene retention suture. Gel-Foam slurry was infused into the soft tissue tract. The venotomy incision was closed Derma bond and sterile dressing. Dressings were applied at the chest wall. Patient tolerated the procedure well and remained hemodynamically stable throughout. No complications were encountered and no significant blood loss encountered. IMPRESSION: Status post image guided right IJ tunneled hemodialysis catheter. Signed, Yvone Neu. Miachel Roux, RPVI [facility] Radiology Electronically Signed   By: Gilmer Mor D.O.   On: 02/12/2024 09:16   DG CHEST PORT 1 VIEW Result Date: 02/12/2024 CLINICAL DATA:  Respiratory distress EXAM: PORTABLE CHEST 1 VIEW COMPARISON:  Partial comparison to CT abdomen/pelvis dated 02/10/2024. Chest radiograph dated 12/31/2023. FINDINGS: Multifocal right lung opacities, raising concern for mild multifocal pneumonia. Superimposed right lower lobe atelectasis with layering small to moderate right pleural effusion. Mild patchy/retrocardiac opacity, favoring atelectasis when correlating with prior CT. No pneumothorax. The heart is normal in size.  Thoracic aortic atherosclerosis. IMPRESSION: Multifocal right lung opacities, raising concern for mild multifocal pneumonia. Superimposed bilateral lower lobe atelectasis. Layering small to moderate right pleural effusion. Electronically Signed   By: Charline Bills M.D.   On: 02/12/2024 03:19    Scheduled Meds:  amLODipine  10 mg Oral QHS   Chlorhexidine Gluconate Cloth  6 each Topical Q0600   doxycycline  100 mg Oral Q12H   heparin  5,000 Units Subcutaneous Q8H   hydrALAZINE  25 mg Oral BID   insulin aspart  0-5 Units Subcutaneous QHS   insulin aspart  0-6 Units Subcutaneous TID WC   ipratropium-albuterol  3 mL Nebulization Q6H   levothyroxine  75 mcg Oral Daily   memantine  10 mg Oral BID   nicotine  7 mg Transdermal Daily   sevelamer carbonate  800 mg Oral TID WC   sodium bicarbonate  650 mg Oral BID   Continuous Infusions:  albuterol     cefTRIAXone (ROCEPHIN)  IV 1 g (02/12/24 2202)   iron sucrose       LOS: 2 days   Time spent: 40 minutes  Carollee Herter, DO  [facility]  02/13/2024, 1:26 PM

## 2024-02-14 ENCOUNTER — Encounter (HOSPITAL_COMMUNITY): Payer: Self-pay | Admitting: Internal Medicine

## 2024-02-14 ENCOUNTER — Encounter (HOSPITAL_COMMUNITY): Payer: Medicare PPO

## 2024-02-14 ENCOUNTER — Inpatient Hospital Stay (HOSPITAL_COMMUNITY): Payer: Self-pay | Admitting: Anesthesiology

## 2024-02-14 ENCOUNTER — Other Ambulatory Visit: Payer: Self-pay

## 2024-02-14 ENCOUNTER — Encounter (HOSPITAL_COMMUNITY)
Admission: EM | Disposition: A | Discharge status: Home / Self Care | Payer: Self-pay | Source: Home / Self Care | Attending: Internal Medicine

## 2024-02-14 DIAGNOSIS — F17210 Nicotine dependence, cigarettes, uncomplicated: Secondary | ICD-10-CM | POA: Diagnosis not present

## 2024-02-14 DIAGNOSIS — I120 Hypertensive chronic kidney disease with stage 5 chronic kidney disease or end stage renal disease: Secondary | ICD-10-CM | POA: Diagnosis not present

## 2024-02-14 DIAGNOSIS — J90 Pleural effusion, not elsewhere classified: Secondary | ICD-10-CM | POA: Diagnosis not present

## 2024-02-14 DIAGNOSIS — N185 Chronic kidney disease, stage 5: Secondary | ICD-10-CM | POA: Diagnosis not present

## 2024-02-14 DIAGNOSIS — N186 End stage renal disease: Secondary | ICD-10-CM | POA: Diagnosis not present

## 2024-02-14 DIAGNOSIS — Z992 Dependence on renal dialysis: Secondary | ICD-10-CM

## 2024-02-14 DIAGNOSIS — N179 Acute kidney failure, unspecified: Secondary | ICD-10-CM | POA: Diagnosis not present

## 2024-02-14 DIAGNOSIS — Z9889 Other specified postprocedural states: Secondary | ICD-10-CM

## 2024-02-14 DIAGNOSIS — J9601 Acute respiratory failure with hypoxia: Secondary | ICD-10-CM | POA: Diagnosis not present

## 2024-02-14 HISTORY — PX: AV FISTULA PLACEMENT: SHX1204

## 2024-02-14 HISTORY — DX: Other specified postprocedural states: Z98.890

## 2024-02-14 LAB — HEPATIC FUNCTION PANEL
ALT: 6 U/L (ref 0–44)
AST: 14 U/L — ABNORMAL LOW (ref 15–41)
Albumin: 2.9 g/dL — ABNORMAL LOW (ref 3.5–5.0)
Alkaline Phosphatase: 52 U/L (ref 38–126)
Bilirubin, Direct: 0.2 mg/dL (ref 0.0–0.2)
Indirect Bilirubin: 0.3 mg/dL (ref 0.3–0.9)
Total Bilirubin: 0.5 mg/dL (ref 0.0–1.2)
Total Protein: 6.1 g/dL — ABNORMAL LOW (ref 6.5–8.1)

## 2024-02-14 LAB — CBC WITH DIFFERENTIAL/PLATELET
Abs Immature Granulocytes: 0.09 10*3/uL — ABNORMAL HIGH (ref 0.00–0.07)
Basophils Absolute: 0 10*3/uL (ref 0.0–0.1)
Basophils Relative: 0 %
Eosinophils Absolute: 0.1 10*3/uL (ref 0.0–0.5)
Eosinophils Relative: 0 %
HCT: 26.7 % — ABNORMAL LOW (ref 39.0–52.0)
Hemoglobin: 8.8 g/dL — ABNORMAL LOW (ref 13.0–17.0)
Immature Granulocytes: 1 %
Lymphocytes Relative: 9 %
Lymphs Abs: 1 10*3/uL (ref 0.7–4.0)
MCH: 30 pg (ref 26.0–34.0)
MCHC: 33 g/dL (ref 30.0–36.0)
MCV: 91.1 fL (ref 80.0–100.0)
Monocytes Absolute: 1.1 10*3/uL — ABNORMAL HIGH (ref 0.1–1.0)
Monocytes Relative: 9 %
Neutro Abs: 9.6 10*3/uL — ABNORMAL HIGH (ref 1.7–7.7)
Neutrophils Relative %: 81 %
Platelets: 256 10*3/uL (ref 150–400)
RBC: 2.93 MIL/uL — ABNORMAL LOW (ref 4.22–5.81)
RDW: 15.2 % (ref 11.5–15.5)
WBC: 11.9 10*3/uL — ABNORMAL HIGH (ref 4.0–10.5)
nRBC: 0.2 % (ref 0.0–0.2)

## 2024-02-14 LAB — RENAL FUNCTION PANEL
Albumin: 2.9 g/dL — ABNORMAL LOW (ref 3.5–5.0)
Anion gap: 13 (ref 5–15)
BUN: 60 mg/dL — ABNORMAL HIGH (ref 8–23)
CO2: 23 mmol/L (ref 22–32)
Calcium: 8.3 mg/dL — ABNORMAL LOW (ref 8.9–10.3)
Chloride: 99 mmol/L (ref 98–111)
Creatinine, Ser: 5.99 mg/dL — ABNORMAL HIGH (ref 0.61–1.24)
GFR, Estimated: 9 mL/min — ABNORMAL LOW (ref >60)
Glucose, Bld: 81 mg/dL (ref 70–99)
Phosphorus: 6.8 mg/dL — ABNORMAL HIGH (ref 2.5–4.6)
Potassium: 4.2 mmol/L (ref 3.5–5.1)
Sodium: 135 mmol/L (ref 135–145)

## 2024-02-14 LAB — POCT I-STAT, CHEM 8
BUN: 55 mg/dL — ABNORMAL HIGH (ref 8–23)
Calcium, Ion: 1.01 mmol/L — ABNORMAL LOW (ref 1.15–1.40)
Chloride: 99 mmol/L (ref 98–111)
Creatinine, Ser: 6.8 mg/dL — ABNORMAL HIGH (ref 0.61–1.24)
Glucose, Bld: 85 mg/dL (ref 70–99)
HCT: 27 % — ABNORMAL LOW (ref 39.0–52.0)
Hemoglobin: 9.2 g/dL — ABNORMAL LOW (ref 13.0–17.0)
Potassium: 4.1 mmol/L (ref 3.5–5.1)
Sodium: 135 mmol/L (ref 135–145)
TCO2: 24 mmol/L (ref 22–32)

## 2024-02-14 LAB — GLUCOSE, CAPILLARY
Glucose-Capillary: 72 mg/dL (ref 70–99)
Glucose-Capillary: 84 mg/dL (ref 70–99)
Glucose-Capillary: 112 mg/dL — ABNORMAL HIGH (ref 70–99)
Glucose-Capillary: 114 mg/dL — ABNORMAL HIGH (ref 70–99)

## 2024-02-14 LAB — PROCALCITONIN: Procalcitonin: 0.34 ng/mL

## 2024-02-14 LAB — PATHOLOGIST SMEAR REVIEW

## 2024-02-14 SURGERY — ARTERIOVENOUS (AV) FISTULA CREATION
Anesthesia: Regional | Site: Arm Upper | Laterality: Left

## 2024-02-14 MED ORDER — DEXMEDETOMIDINE HCL IN NACL 200 MCG/50ML IV SOLN
INTRAVENOUS | Status: DC | PRN
  Administered 2024-02-14: .5 ug/kg/h via INTRAVENOUS

## 2024-02-14 MED ORDER — SODIUM CHLORIDE 0.9 % IV SOLN: INTRAVENOUS | Status: DC

## 2024-02-14 MED ORDER — OXYCODONE HCL 5 MG/5ML PO SOLN: 5.0000 mg | Freq: Once | ORAL | Status: DC | PRN

## 2024-02-14 MED ORDER — FENTANYL CITRATE (PF) 100 MCG/2ML IJ SOLN
INTRAMUSCULAR | Status: AC
  Administered 2024-02-14: 50 ug
  Filled 2024-02-14: qty 2

## 2024-02-14 MED ORDER — LIDOCAINE-EPINEPHRINE (PF) 1 %-1:200000 IJ SOLN
INTRAMUSCULAR | Status: AC
  Filled 2024-02-14: qty 30

## 2024-02-14 MED ORDER — ALBUMIN HUMAN 5 % IV SOLN
12.5000 g | Freq: Once | INTRAVENOUS | Status: AC
  Administered 2024-02-14: 12.5 g via INTRAVENOUS

## 2024-02-14 MED ORDER — CEFAZOLIN SODIUM-DEXTROSE 2-4 GM/100ML-% IV SOLN
INTRAVENOUS | Status: AC
  Filled 2024-02-14: qty 100

## 2024-02-14 MED ORDER — LIDOCAINE HCL (PF) 1 % IJ SOLN
INTRAMUSCULAR | Status: AC
  Filled 2024-02-14: qty 30

## 2024-02-14 MED ORDER — FENTANYL CITRATE (PF) 100 MCG/2ML IJ SOLN: 25.0000 ug | INTRAMUSCULAR | Status: DC | PRN

## 2024-02-14 MED ORDER — 0.9 % SODIUM CHLORIDE (POUR BTL) OPTIME
TOPICAL | Status: DC | PRN
  Administered 2024-02-14: 1000 mL

## 2024-02-14 MED ORDER — ALBUMIN HUMAN 5 % IV SOLN
INTRAVENOUS | Status: AC
  Filled 2024-02-14: qty 250

## 2024-02-14 MED ORDER — DEXMEDETOMIDINE HCL IN NACL 400 MCG/100ML IV SOLN
INTRAVENOUS | Status: AC
  Filled 2024-02-14: qty 100

## 2024-02-14 MED ORDER — ONDANSETRON HCL 4 MG/2ML IJ SOLN: 4.0000 mg | Freq: Once | INTRAMUSCULAR | Status: DC | PRN

## 2024-02-14 MED ORDER — CEFADROXIL 500 MG PO CAPS
500.0000 mg | ORAL_CAPSULE | Freq: Every day | ORAL | Status: DC
  Administered 2024-02-14 – 2024-02-16 (×3): 500 mg via ORAL
  Filled 2024-02-14 (×3): qty 1

## 2024-02-14 MED ORDER — CHLORHEXIDINE GLUCONATE 0.12 % MT SOLN
15.0000 mL | Freq: Once | OROMUCOSAL | Status: AC
  Filled 2024-02-14: qty 15

## 2024-02-14 MED ORDER — PHENYLEPHRINE HCL-NACL 20-0.9 MG/250ML-% IV SOLN
INTRAVENOUS | Status: DC | PRN
  Administered 2024-02-14: 10 ug/min via INTRAVENOUS

## 2024-02-14 MED ORDER — MEPIVACAINE HCL (PF) 1.5 % IJ SOLN
INTRAMUSCULAR | Status: DC | PRN
  Administered 2024-02-14: 20 mL via PERINEURAL

## 2024-02-14 MED ORDER — CHLORHEXIDINE GLUCONATE 0.12 % MT SOLN
OROMUCOSAL | Status: AC
  Administered 2024-02-14: 15 mL via OROMUCOSAL
  Filled 2024-02-14: qty 15

## 2024-02-14 MED ORDER — FENTANYL CITRATE (PF) 100 MCG/2ML IJ SOLN: 50.0000 ug | Freq: Once | INTRAMUSCULAR | Status: DC

## 2024-02-14 MED ORDER — DEXMEDETOMIDINE HCL IN NACL 80 MCG/20ML IV SOLN
INTRAVENOUS | Status: AC
Start: 2024-02-14 — End: ?
  Filled 2024-02-14: qty 20

## 2024-02-14 MED ORDER — DEXMEDETOMIDINE HCL IN NACL 80 MCG/20ML IV SOLN
INTRAVENOUS | Status: DC | PRN
  Administered 2024-02-14 (×2): 10 ug via INTRAVENOUS

## 2024-02-14 MED ORDER — OXYCODONE HCL 5 MG PO TABS: 5.0000 mg | ORAL_TABLET | Freq: Once | ORAL | Status: DC | PRN

## 2024-02-14 MED ORDER — HEPARIN 6000 UNIT IRRIGATION SOLUTION
Status: DC | PRN
  Administered 2024-02-14: 1

## 2024-02-14 MED ORDER — PROPOFOL 10 MG/ML IV BOLUS
INTRAVENOUS | Status: DC | PRN
  Administered 2024-02-14: 30 mg via INTRAVENOUS

## 2024-02-14 MED ORDER — ORAL CARE MOUTH RINSE: 15.0000 mL | Freq: Once | OROMUCOSAL | Status: AC

## 2024-02-14 SURGICAL SUPPLY — 29 items
ARMBAND PINK RESTRICT EXTREMIT (MISCELLANEOUS) ×1 IMPLANT
BAG COUNTER SPONGE SURGICOUNT (BAG) ×1 IMPLANT
CANISTER SUCT 3000ML PPV (MISCELLANEOUS) ×1 IMPLANT
CLIP LIGATING EXTRA MED SLVR (CLIP) ×1 IMPLANT
CLIP LIGATING EXTRA SM BLUE (MISCELLANEOUS) ×1 IMPLANT
COVER PROBE W GEL 5X96 (DRAPES) IMPLANT
DERMABOND ADVANCED .7 DNX12 (GAUZE/BANDAGES/DRESSINGS) ×1 IMPLANT
ELECT REM PT RETURN 9FT ADLT (ELECTROSURGICAL) ×1 IMPLANT
ELECTRODE REM PT RTRN 9FT ADLT (ELECTROSURGICAL) ×1 IMPLANT
GAUZE 4X4 16PLY ~~LOC~~+RFID DBL (SPONGE) IMPLANT
GLOVE BIO SURGEON STRL SZ7.5 (GLOVE) ×1 IMPLANT
GLOVE BIOGEL PI IND STRL 6.5 (GLOVE) IMPLANT
GOWN STRL REUS W/ TWL LRG LVL3 (GOWN DISPOSABLE) ×2 IMPLANT
GOWN STRL REUS W/ TWL XL LVL3 (GOWN DISPOSABLE) ×1 IMPLANT
INSERT FOGARTY SM (MISCELLANEOUS) IMPLANT
KIT BASIN OR (CUSTOM PROCEDURE TRAY) ×1 IMPLANT
KIT TURNOVER KIT B (KITS) ×1 IMPLANT
NS IRRIG 1000ML POUR BTL (IV SOLUTION) ×1 IMPLANT
PACK CV ACCESS (CUSTOM PROCEDURE TRAY) ×1 IMPLANT
PAD ARMBOARD 7.5X6 YLW CONV (MISCELLANEOUS) ×2 IMPLANT
POWDER SURGICEL 3.0 GRAM (HEMOSTASIS) IMPLANT
SLING ARM FOAM STRAP LRG (SOFTGOODS) IMPLANT
SLING ARM FOAM STRAP MED (SOFTGOODS) IMPLANT
SUT MNCRL AB 4-0 PS2 18 (SUTURE) ×1 IMPLANT
SUT PROLENE 6 0 BV (SUTURE) ×1 IMPLANT
SUT VIC AB 3-0 SH 27X BRD (SUTURE) ×1 IMPLANT
TOWEL GREEN STERILE (TOWEL DISPOSABLE) ×1 IMPLANT
UNDERPAD 30X36 HEAVY ABSORB (UNDERPADS AND DIAPERS) ×1 IMPLANT
WATER STERILE IRR 1000ML POUR (IV SOLUTION) ×1 IMPLANT

## 2024-02-14 NOTE — Progress Notes (Signed)
 Pt has been accepted at [facility] on TTS 11:55 am chair time. Pt can start on Tuesday, Marh 4 and will need to arrive at 10:55 am to complete paperwork prior to treatment. Met with pt and pt's wife at bedside. Discussed above arrangements and both agreeable to plans. Schedule letter provided to pt. Arrangements added to AVS. Update provided to attending, nephrologist,and pt's RN. Staff advised that pt is requesting to speak to case management regarding home needs at d/c over the weekend. Contacted renal NP regarding clinic's need for orders if pt is d/c this weekend. Will assist as needed.   Olivia Canter Renal Navigator [phone]

## 2024-02-14 NOTE — Anesthesia Postprocedure Evaluation (Signed)
 Anesthesia Post Note  Patient: Joseph Hebert.  Procedure(s) Performed: LEFT ARM BRACHIOCEPHALIC ARTERIOVENOUS FISTULA CREATION (Left: Arm Upper)     Patient location during evaluation: PACU Anesthesia Type: Regional Level of consciousness: awake and alert Pain management: pain level controlled Vital Signs Assessment: post-procedure vital signs reviewed and stable Respiratory status: spontaneous breathing, nonlabored ventilation, respiratory function stable and patient connected to nasal cannula oxygen Cardiovascular status: stable and blood pressure returned to baseline Anesthetic complications: no   No notable events documented.  Last Vitals:  Vitals:   02/14/24 1300 02/14/24 1340  BP: (!) 100/56 (!) 97/53  Pulse: 79 85  Resp: 15 19  Temp:  (!) 36.4 C  SpO2: 97% 94%    Last Pain:  Vitals:   02/14/24 1340  TempSrc: Oral  PainSc:                  Beryle Lathe

## 2024-02-14 NOTE — Progress Notes (Addendum)
    Progress Note    02/14/2024 9:18 AM * Day of Surgery *  Subjective: hungry  Vitals:   02/14/24 0826 02/14/24 0850  BP:  129/64  Pulse: 84 100  Resp: 16 18  Temp:  98.6 F (37 C)  SpO2: 92% (!) 86%    Physical Exam: Aaox3 Palpable left radial artery No readily identifiable surface veins  CBC    Component Value Date/Time   WBC 11.9 (H) 02/14/2024 0728   RBC 2.93 (L) 02/14/2024 0728   HGB 8.8 (L) 02/14/2024 0728   HCT 26.7 (L) 02/14/2024 0728   PLT 256 02/14/2024 0728   MCV 91.1 02/14/2024 0728   MCH 30.0 02/14/2024 0728   MCHC 33.0 02/14/2024 0728   RDW 15.2 02/14/2024 0728   LYMPHSABS 1.0 02/14/2024 0728   MONOABS 1.1 (H) 02/14/2024 0728   EOSABS 0.1 02/14/2024 0728   BASOSABS 0.0 02/14/2024 0728    BMET    Component Value Date/Time   NA 135 02/13/2024 0712   K 5.1 02/13/2024 0712   CL 100 02/13/2024 0712   CO2 18 (L) 02/13/2024 0712   GLUCOSE 114 (H) 02/13/2024 0712   BUN 95 (H) 02/13/2024 0712   CREATININE 9.16 (H) 02/13/2024 0712   CALCIUM 8.7 (L) 02/13/2024 0712   GFRNONAA 5 (L) 02/13/2024 0712   GFRAA 32 (L) 02/26/2019 0408    INR    Component Value Date/Time   INR 1.0 02/23/2019 0757     Intake/Output Summary (Last 24 hours) at 02/14/2024 0918 Last data filed at 02/14/2024 0343 Gross per 24 hour  Intake 460 ml  Output 2350 ml  Net -1890 ml     Assessment:  78 y.o. male is here with esrd  Plan:  NPO with plan for left UE HD access fistula verses graft Patient agrees to proceed  Mosetta Pigeon PA-C   I have independently interviewed and examined patient and agree with PA assessment and plan above. Plan for left upper arm avf vs more likely avg in OR today.  Kaleya Douse C. Randie Heinz, MD [facility] Office: [phone] Pager: [phone]

## 2024-02-14 NOTE — Progress Notes (Signed)
 PROGRESS NOTE    Joseph Hebert.  ZOX:[id number] DOB: Aug 28, 1946 DOA: 02/10/2024 PCP: Paulina Fusi, MD  Subjective: Pt seen and examined.   Still on O2. Has spells where he is very short winded. Has to remain sitting nearly upright.  Had HD session yesterday that removed 2 liters of fluid.  Bedside thoracic U/S shows moderate sized right pleural effusion and smaller left pleural effusion.  Remains on 4 L/min.  Afebrile.  Blood cx are negative thus far.  Pt is agreeable to thoracentesis tomorrow.   Hospital Course: HPI: Joseph Hebert. is a 78 y.o. male with medical history significant for DM which does not require medication, htn and ESRD.  The patient does not know what his baseline creatinine is but he tells me that the nephrologist have been talking to him about the possibility of hemodialysis for his last couple of visits he had a routine visit today and had blood work.  He says he went home and was called on the phone and told to come to the emergency room right away.  Patient has been unable to urinate all day today despite trying.  He denies any chest pain or shortness of breath or fevers or chills.  In the emergency department the patient's blood work revealed a creatinine of 11 and a potassium of 5.9.  The bladder scan revealed only 250 mL of urine and the patient was unable to urinate.  Urology was called from the emergency department.  He will be admitted likely will require initiation of HD during this hospital stay.  Significant Events: Admitted 02/10/2024 acute on CKD stage 5(not on HD)   Significant Labs: WBC 8.7, HgB 11.0, plt 328 Na 136, K 5.9, CO2 of 14, BUN 104, scr 11.09, glu 126  Significant Imaging Studies: CT renal stone Cholelithiasis, without associated inflammatory changes. Mild left colonic diverticulosis, without evidence of diverticulitis. Small bilateral pleural effusions. Additional ancillary findings as above.Aortic Atherosclerosis  CXR  Multifocal right lung opacities, raising concern for mild multifocal pneumonia. Superimposed bilateral lower lobe atelectasis. Layering small to moderate right pleural effusion.  Antibiotic Therapy: Anti-infectives (From admission, onward)    Start     Dose/Rate Route Frequency Ordered Stop   02/12/24 1000  ceFAZolin (ANCEF) IVPB 2g/100 mL premix       Note to Pharmacy: Admin in IR only   2 g 200 mL/hr over 30 Minutes Intravenous  Once 02/11/24 1450     02/12/24 0400  doxycycline (VIBRAMYCIN) 100 mg in sodium chloride 0.9 % 250 mL IVPB        100 mg 125 mL/hr over 120 Minutes Intravenous 2 times daily 02/12/24 0258     02/12/24 0300  cefTRIAXone (ROCEPHIN) 1 g in sodium chloride 0.9 % 100 mL IVPB        1 g 200 mL/hr over 30 Minutes Intravenous Daily at bedtime 02/12/24 0258        Procedures: 02-12-2024 right HD catheter placement  Consultants: Nephrology    Assessment and Plan: * Acute renal failure superimposed on stage 5 chronic kidney disease, not on chronic dialysis (HCC) 02-12-2024 seen by nephrology. See "ESRD on dialysis"  ESRD (end stage renal disease) on dialysis (HCC) 02-12-2024 had HD catheter placed today. Nephrology planning back-to-back HD sessions.  Pt lives in [place]. Will need renal coordinator to start outpt HD slot search. 02-13-2024 dialysis coordinator has started outpatient HD slot search  02-14-2024 continue HD per nephrology. He is now S/P left AVF creation  today. Won't be ready to use for at least 6 months. Dialysis coordinator working on outpatient HD slot.  Acute respiratory failure with hypoxia (HCC) 02-12-2024 events of last night noted.  Now on 5 L/min supplemental O2. Not on home O2. Had bilateral pleural effusions on CT renal stone 02-10-2024.  Flu/covid/RSV negative. Checking RVP.  No fevers. No leukocytosis.  Procal only mildly elevated. Don't think this is really pneumonia. Most likely volume overload from ESRD. Hopefully HD will help this  that. Appears pt is going to have back-to-back HD session over next 2 days. Will re-evaluate pleural effusion. May need thoracentesis after 2 HD sessions. Discussed pt, wife and sister-in-law. Continue abx for now.  02-13-2024 remains on 4 L/min. No fevers. Pt with moderate/large right pleural effusion. My estimates are about [phone] ml of pleural fluid on the right side. Will send pt to U/S tomorrow for right side thoracentesis. Pt is agreeable. He is not on any systemic anticoagulants. Still don't think he has pneumonia. Repeat CBC and procal in AM. Will probably stop IV abx tomorrow.  02-14-2024 IR able to get his right thoracentesis done yesterday. As expected he has 1000 ml of pleural fluid removed from right pleural space. Appears to be transudate. Procal minimally elevated. Can change to po abx. Wean O2 to RA.  Metabolic acidosis 02-12-2024 on po bicarb bid. 02-13-2024 serum CO2 of 18 today. On bid bicarb. CO2 was 16 yesterday.  02-14-2024 resolved with PO bicarb and HD. CO2 today was 23  Hyperkalemia 02-12-2024 give po lokelma 02-13-2024 K 5.1 today. Pt going to HD again today.  02-14-2024 resolved. K 4.2  Bilateral pleural effusion 02-12-2024 seen on CT renal stone 02-09-2025. I think his enlarging bilateral pleural effusions R > L is likely responsible for his acute respiratory failure with hypoxia rather than pneumonia. Will see if HD is able to remove some of the effusion after 2 HD session. If he is still on O2 after 2 HD session, may need to proceed with thoracentesis to remove effusions. 02-13-2024 on bedside thoracic U/S today by this writer, pt has mod/large right pleural effusion(estimated 750-131ml) and smaller left pleural effusion. Will order thoracentesis for tomorrow on right side.  02-14-2024 IR able to get his right thoracentesis done yesterday. As expected he has 1000 ml of pleural fluid removed. Appears to be transudate. Procal minimally elevated. Can change to po  abx.  Essential hypertension 02-12-2024 on hydralazine 25 mg bid, norvasc 10 mg at bedtime.. Will need to see how his BP reacts to HD.  02-13-2024 s/p 2 liters removed with UF yesterday. Going for HD session #2 today.  Continue to monitor BP. Hold off on adding any further HTN meds until he is euvolemic on his volume status.  02-14-2024 stable. Pt limited to 60 ounces of fluid per day. BP low today. May need to hold some of his BP meds.  Dependent for wheelchair mobility 02-12-2024 pt has been wheelchair dependent for about 9-10 years. After his back surgery. Able to transfer to wheelchair. Discussed with pt, wife and sister-in-law. 02-13-2024 chronic.  02-14-2024 chronic. DC plan is to home.  Diabetes mellitus without complication (HCC) 02-12-2024 add SSI. ESRD scale. 02-13-2024 stable.  02-14-2024 stable. CBG acceptable ranges.  Dementia (HCC) 02-12-2024 stable. On namenda bid. Seems pretty appropriate today. Monitor for hospital acquired delirium. 02-13-2024 pt is AxOx4 today.  02-14-2024 stable. He is AxOx4 today.  COPD (chronic obstructive pulmonary disease) (HCC) 02-12-2024 stable. Not wheezing. No exacerbation. Checking RVP to make sure  he doesn't have any other viral illnesses.[name] RVP/flu/covid is negative. Stop steroids. 02-13-2024 pt is not wheezing. His COPD is not exacerbated.  02-14-2024 stable.  History of anemia due to chronic kidney disease 02-12-2024 defer to nephrology if he needs epo 02-13-2024 check CBC in AM.  02-14-2024 HgB 8.8 g/dl today.  S/P arteriovenous (AV) fistula creation - Left UE. 02-14-2024. 02-14-2024 had left UE AVF creation today.  DVT prophylaxis: heparin injection 5,000 Units Start: 02/11/24 1400    Code Status: Full Code Family Communication: no family at bedside Disposition Plan: return home Reason for continuing need for hospitalization: awaiting outpatient HD slot. Continuing with HD treatments.  Objective: Vitals:   02/14/24  1300 02/14/24 1340 02/14/24 1531 02/14/24 1600  BP: (!) 100/56 (!) 97/53  (!) 123/94  Pulse: 79 85 85 87  Resp: 15 19 18 18   Temp:  (!) 97.5 F (36.4 C)  97.6 F (36.4 C)  TempSrc:  Oral    SpO2: 97% 94% 94% 90%  Weight:      Height:        Intake/Output Summary (Last 24 hours) at 02/14/2024 1602 Last data filed at 02/14/2024 1230 Gross per 24 hour  Intake 460 ml  Output 2620 ml  Net -2160 ml   Filed Weights   02/12/24 1551 02/12/24 1826 02/14/24 0850  Weight: 87.4 kg (S) 86.5 kg 86.5 kg   Examination:  Physical Exam Vitals and nursing note reviewed.  Constitutional:      General: He is not in acute distress.    Appearance: He is not toxic-appearing or diaphoretic.  HENT:     Head: Normocephalic and atraumatic.     Nose: Nose normal.  Cardiovascular:     Rate and Rhythm: Normal rate and regular rhythm.  Pulmonary:     Effort: Pulmonary effort is normal. No respiratory distress.     Comments: Wet cough No wheezing No distress Abdominal:     General: Bowel sounds are normal.     Palpations: Abdomen is soft.  Musculoskeletal:     Right lower leg: No edema.     Left lower leg: No edema.  Skin:    General: Skin is warm and dry.     Capillary Refill: Capillary refill takes less than 2 seconds.  Neurological:     General: No focal deficit present.     Mental Status: He is alert and oriented to person, place, and time.   Data Reviewed: I have personally reviewed following labs and imaging studies  CBC: Recent Labs  Lab 02/10/24 1920 02/11/24 0522 02/11/24 1513 02/14/24 0728 02/14/24 0918  WBC 8.7 8.1 8.3 11.9*  --   NEUTROABS  --   --   --  9.6*  --   HGB 11.0* 8.9* 9.6* 8.8* 9.2*  HCT 35.4* 28.3* 30.3* 26.7* 27.0*  MCV 95.2 94.0 94.1 91.1  --   PLT 328 275 293 256  --    Basic Metabolic Panel: Recent Labs  Lab 02/11/24 0522 02/11/24 1513 02/12/24 0343 02/12/24 0354 02/13/24 0712 02/14/24 0744 02/14/24 0918  NA 139 135  --  137 135 135 135  K  5.7* 5.6*  --  5.7* 5.1 4.2 4.1  CL 105 104  --  104 100 99 99  CO2 16* 15*  --  16* 18* 23  --   GLUCOSE 121* 240*  --  166* 114* 81 85  BUN 104* 102*  --  114* 95* 60* 55*  CREATININE 10.30* 10.80*  --  11.27* 9.16* 5.99* 6.80*  CALCIUM 7.5* 7.9*  --  8.1* 8.7* 8.3*  --   MG  --   --  2.1  --   --   --   --   PHOS 10.5* 10.6*  --  10.0* 9.3* 6.8*  --    GFR: Estimated Creatinine Clearance: 9.2 mL/min (A) (by C-G formula based on SCr of 6.8 mg/dL (H)). Liver Function Tests: Recent Labs  Lab 02/10/24 1920 02/11/24 0522 02/11/24 1513 02/12/24 0354 02/13/24 0712 02/14/24 0744 02/14/24 0840  AST 14* 10*  --   --   --   --  14*  ALT 13 13  --   --   --   --  6  ALKPHOS 80 61  --   --   --   --  52  BILITOT 0.4 0.6  --   --   --   --  0.5  PROT 6.8 5.5*  --   --   --   --  6.1*  ALBUMIN 3.3* 2.6* 2.9* 3.0* 3.0* 2.9* 2.9*   Recent Labs  Lab 02/10/24 1920  LIPASE 51   HbA1C: Recent Labs    02/12/24 1948  HGBA1C 5.3   CBG: Recent Labs  Lab 02/13/24 1612 02/13/24 2021 02/14/24 0810 02/14/24 1129 02/14/24 1601  GLUCAP 93 119* 72 84 112*   Anemia Panel: Recent Labs    02/12/24 0343  FERRITIN 81  TIBC 273  IRON 41*   Sepsis Labs: Recent Labs  Lab 02/12/24 0343 02/14/24 0840  PROCALCITON 0.17 0.34    Recent Results (from the past 240 hours)  MRSA Next Gen by PCR, Nasal     Status: None   Collection Time: 02/12/24  2:59 AM   Specimen: Nasal Mucosa; Nasal Swab  Result Value Ref Range Status   MRSA by PCR Next Gen NOT DETECTED NOT DETECTED Final    Comment: (NOTE) The GeneXpert MRSA Assay (FDA approved for NASAL specimens only), is one component of a comprehensive MRSA colonization surveillance program. It is not intended to diagnose MRSA infection nor to guide or monitor treatment for MRSA infections. Test performance is not FDA approved in patients less than 48 years old. Performed at [facility], [address] [place], [place] [address]    Culture, blood (Routine X 2) w Reflex to ID Panel     Status: None (Preliminary result)   Collection Time: 02/12/24  3:43 AM   Specimen: BLOOD  Result Value Ref Range Status   Specimen Description BLOOD BLOOD LEFT ARM  Final   Special Requests   Final    BOTTLES DRAWN AEROBIC AND ANAEROBIC Blood Culture results may not be optimal due to an inadequate volume of blood received in culture bottles   Culture   Final    NO GROWTH 2 DAYS Performed at [facility], [address], [place], [place] [address]    Report Status PENDING  Incomplete  Culture, blood (Routine X 2) w Reflex to ID Panel     Status: None (Preliminary result)   Collection Time: 02/12/24  3:54 AM   Specimen: BLOOD  Result Value Ref Range Status   Specimen Description BLOOD BLOOD RIGHT HAND  Final   Special Requests   Final    BOTTLES DRAWN AEROBIC AND ANAEROBIC Blood Culture adequate volume   Culture   Final    NO GROWTH 2 DAYS Performed at [facility], [address] [place], [place]  [address]    Report Status PENDING  Incomplete  Resp panel by RT-PCR (RSV, Flu A&B, Covid) Anterior Nasal Swab     Status: None   Collection Time: 02/12/24 10:48 AM   Specimen: Anterior Nasal Swab  Result Value Ref Range Status   SARS Coronavirus 2 by RT PCR NEGATIVE NEGATIVE Final   Influenza A by PCR NEGATIVE NEGATIVE Final   Influenza B by PCR NEGATIVE NEGATIVE Final    Comment: (NOTE) The Xpert Xpress SARS-CoV-2/FLU/RSV plus assay is intended as an aid in the diagnosis of influenza from Nasopharyngeal swab specimens and should not be used as a sole basis for treatment. Nasal washings and aspirates are unacceptable for Xpert Xpress SARS-CoV-2/FLU/RSV testing.  Fact Sheet for Patients: BloggerCourse.com  Fact Sheet for Healthcare Providers: SeriousBroker.it  This test is not yet approved or cleared by the [place] FDA and has been authorized for detection  and/or diagnosis of SARS-CoV-2 by FDA under an Emergency Use Authorization (EUA). This EUA will remain in effect (meaning this test can be used) for the duration of the COVID-19 declaration under Section 564(b)(1) of the Act, 21 U.S.C. section 360bbb-3(b)(1), unless the authorization is terminated or revoked.     Resp Syncytial Virus by PCR NEGATIVE NEGATIVE Final    Comment: (NOTE) Fact Sheet for Patients: BloggerCourse.com  Fact Sheet for Healthcare Providers: SeriousBroker.it  This test is not yet approved or cleared by the [place] FDA and has been authorized for detection and/or diagnosis of SARS-CoV-2 by FDA under an Emergency Use Authorization (EUA). This EUA will remain in effect (meaning this test can be used) for the duration of the COVID-19 declaration under Section 564(b)(1) of the Act, 21 U.S.C. section 360bbb-3(b)(1), unless the authorization is terminated or revoked.  Performed at [name], [address], [place], [place] [address]   Respiratory (~20 pathogens) panel by PCR     Status: None   Collection Time: 02/12/24  2:30 PM   Specimen: Nasopharyngeal Swab; Respiratory  Result Value Ref Range Status   Adenovirus NOT DETECTED NOT DETECTED Final   Coronavirus 229E NOT DETECTED NOT DETECTED Final    Comment: (NOTE) The Coronavirus on the Respiratory Panel, DOES NOT test for the novel  Coronavirus (2019 nCoV)    Coronavirus HKU1 NOT DETECTED NOT DETECTED Final   Coronavirus NL63 NOT DETECTED NOT DETECTED Final   Coronavirus OC43 NOT DETECTED NOT DETECTED Final   Metapneumovirus NOT DETECTED NOT DETECTED Final   Rhinovirus / Enterovirus NOT DETECTED NOT DETECTED Final   Influenza A NOT DETECTED NOT DETECTED Final   Influenza B NOT DETECTED NOT DETECTED Final   Parainfluenza Virus 1 NOT DETECTED NOT DETECTED Final   Parainfluenza Virus 2 NOT DETECTED NOT DETECTED Final   Parainfluenza Virus 3 NOT  DETECTED NOT DETECTED Final   Parainfluenza Virus 4 NOT DETECTED NOT DETECTED Final   Respiratory Syncytial Virus NOT DETECTED NOT DETECTED Final   Bordetella pertussis NOT DETECTED NOT DETECTED Final   Bordetella Parapertussis NOT DETECTED NOT DETECTED Final   Chlamydophila pneumoniae NOT DETECTED NOT DETECTED Final   Mycoplasma pneumoniae NOT DETECTED NOT DETECTED Final    Comment: Performed at [facility], [address], [place], [place] [address]     Radiology Studies: DG Chest 1 View Result Date: 02/13/2024 CLINICAL DATA:  [id number] S/P thoracentesis [id number] EXAM: PORTABLE CHEST 1 VIEW COMPARISON:  Chest XR, 02/12/2024.  CT chest, 11/02/2010. FINDINGS: Support lines: RIGHT chest tunneled dialysis catheter, well-positioned with tip  at the expected location of the superior cavoatrial junction. Cardiac silhouette is within normal limits. Aortic arch atherosclerosis. Improved aeration of the RIGHT lung with no pneumothorax or significant residual pleural effusion trace LEFT pleural effusion. Retrocardiac opacity. Chronic LEFT mid clavicular deformity. No acute osseous abnormality. IMPRESSION: 1. Improved aeration of the RIGHT lung post thoracentesis. No pneumothorax or significant residual pleural effusion. 2. Retrocardiac opacity, most likely to represent atelectasis Electronically Signed   By: Roanna Banning M.D.   On: 02/13/2024 15:49   IR THORACENTESIS ASP PLEURAL SPACE W/IMG GUIDE Result Date: 02/13/2024 INDICATION: 78 year old male. History of end-stage renal disease. Admitted for acute respiratory failure with hypoxia. Found to have a right-sided pleural effusion request is for therapeutic and diagnostic right-sided thoracentesis EXAM: ULTRASOUND GUIDED RIGHT-SIDED THERAPEUTIC AND DIAGNOSTIC THORACENTESIS MEDICATIONS: Lidocaine 1% 10 mL COMPLICATIONS: None immediate. PROCEDURE: An ultrasound guided thoracentesis was thoroughly discussed with the patient and questions answered. The benefits,  risks, alternatives and complications were also discussed. The patient understands and wishes to proceed with the procedure. Written consent was obtained. Ultrasound was performed to localize and mark an adequate pocket of fluid in the right chest. The area was then prepped and draped in the normal sterile fashion. 1% Lidocaine was used for local anesthesia. Under ultrasound guidance a 6 Fr Safe-T-Centesis catheter was introduced. Thoracentesis was performed. The catheter was removed and a dressing applied. FINDINGS: A total of approximately 1 L of straw-colored fluid was removed. Samples were sent to the laboratory as requested by the clinical team. IMPRESSION: Successful ultrasound guided diagnostic and therapeutic RIGHT thoracentesis yielding 1 L of pleural fluid. Performed by Anders Grant NP Electronically Signed   By: Roanna Banning M.D.   On: 02/13/2024 15:46    Scheduled Meds:  amLODipine  10 mg Oral QHS   cefadroxil  500 mg Oral Daily   Chlorhexidine Gluconate Cloth  6 each Topical Q0600   doxycycline  100 mg Oral Q12H   heparin  5,000 Units Subcutaneous Q8H   hydrALAZINE  25 mg Oral BID   insulin aspart  0-5 Units Subcutaneous QHS   insulin aspart  0-6 Units Subcutaneous TID WC   ipratropium-albuterol  3 mL Nebulization Q6H   levothyroxine  75 mcg Oral Daily   memantine  10 mg Oral BID   nicotine  7 mg Transdermal Daily   sevelamer carbonate  800 mg Oral TID WC   sodium bicarbonate  650 mg Oral BID   Continuous Infusions:  albumin human     albuterol     ceFAZolin     iron sucrose Stopped (02/13/24 2046)     LOS: 3 days   Time spent: 40 minutes  Carollee Herter, DO  [facility]  02/14/2024, 4:02 PM

## 2024-02-14 NOTE — Anesthesia Procedure Notes (Signed)
 Anesthesia Regional Block: Supraclavicular block   Pre-Anesthetic Checklist: , timeout performed,  Correct Patient, Correct Site, Correct Laterality,  Correct Procedure, Correct Position, site marked,  Risks and benefits discussed,  Surgical consent,  Pre-op evaluation,  At surgeon's request and post-op pain management  Laterality: Left  Prep: chloraprep       Needles:  Injection technique: Single-shot  Needle Type: Echogenic Needle     Needle Length: 5cm  Needle Gauge: 21     Additional Needles:   Narrative:  Start time: 02/14/2024 9:22 AM End time: 02/14/2024 9:25 AM Injection made incrementally with aspirations every 5 mL.  Performed by: Personally  Anesthesiologist: Beryle Lathe, MD  Additional Notes: No pain on injection. No increased resistance to injection. Injection made in 5cc increments. Good needle visualization. Patient tolerated the procedure well.

## 2024-02-14 NOTE — Op Note (Signed)
    Patient name: Joseph Hebert. MRN: [id number] DOB: [birth year] Sex: male  02/14/2024 Pre-operative Diagnosis: End-stage renal disease Post-operative diagnosis:  Same Surgeon:  Luanna Salk. Randie Heinz, MD Procedure Performed:  Left brachial artery to cephalic vein AV fistula creation  Indications: 78 year old male now with end-stage renal disease with a catheter in place for dialysis.  He is indicated for permanent access in his nondominant left upper extremity.  Findings: There was a cephalic vein at the level of the antecubitum that was dilated to 3-1/2 mm and at completion there was a very strong thrill and a palpable radial artery pulse at the wrist.  Brachial artery measured approximately 4 mm and was free of disease at the antecubitum.   Procedure:  The patient was identified in the holding area and taken to the operating room where he was placed supine operating table and MAC anesthesia was induced.  He was sterilely prepped and draped in the left upper extremity in usual fashion, antibiotics were administered a timeout was called.  A preoperative block had been placed.  Ultrasound was used to identify what appeared to be a suitable cephalic vein crossing over the brachial artery at the antecubital.  The block was checked and noted to be intact.  A transverse incision was created we dissected out the vein dividing branches between clips and ties and marked this for orientation.  We dissected to the deep fascia the brachial artery encircled this with Vesseloops and this was very healthy and suitable in size.  The vein was then doubly clipped distally and transected spatulated and serially dilated to 3-1/2 mm flushed heparinized saline and clamped.  The artery was clamped distally and proximally opened longitudinally and flushed with heparinized saline in both directions.  The vein was then sewn into side to the artery with 6-0 Prolene suture.  Prior completion of flushing all directions.  Upon  completion there was a very strong thrill in the fistula we did free up some of the soft tissue connected allowing it to seat nicely.  Doppler demonstrated very strong flow up the upper arm through the fistula as well as at the wrist level radial artery which was also palpable.  The wound was irrigated and closed in layers of Vicryl and Monocryl.  Dermabond was placed at the skin level.  The patient was awakened from anesthesia having tolerated the procedure well without any complication.  PRIOR to completion for  EBL: 20 cc    Beauden Tremont C. Randie Heinz, MD [facility] Office: [phone] Pager: [phone]

## 2024-02-14 NOTE — Transfer of Care (Addendum)
 Immediate Anesthesia Transfer of Care Note  Patient: Joseph Hebert.  Procedure(s) Performed: ARTERIOVENOUS (AV) FISTULA CREATION LEFT ARM (Left: Arm Upper)  Patient Location: PACU  Anesthesia Type:Regional  Level of Consciousness: drowsy  Airway & Oxygen Therapy: Patient Spontanous Breathing and Patient connected to nasal cannula oxygen  Post-op Assessment: Report given to RN and Post -op Vital signs reviewed and stable  Post vital signs: Reviewed and stable  Last Vitals:  Vitals Value Taken Time  BP 84/47 02/14/24 1138  Temp 36.5 C 02/14/24 1127  Pulse 74 02/14/24 1140  Resp 11 02/14/24 1140  SpO2 95 % 02/14/24 1140  Vitals shown include unfiled device data.  Last Pain:  Vitals:   02/14/24 1130  TempSrc:   PainSc: Asleep         Complications: No notable events documented.

## 2024-02-14 NOTE — Progress Notes (Signed)
 [facility] Progress Note    Assessment/ Plan:     AKI/CKD stage IV, now ESRD - Ruled out obstruction with CT renal stone study.  Losartan d/c'ed. Followed by Dr. Arrie Aran as an outpatient Started HD given worsening labs and constellation of symptoms concerning for uremia. TDC w/ IR and HD#1 2/26. HD#2 2/27 and then #3 tomorrow S/p LUE BC AVF w/ Dr. Randie Heinz 2/28-appreciate assistance CLIP in process. [place]? Avoid nephrotoxic medications including NSAIDs and iodinated intravenous contrast exposure unless the latter is absolutely indicated.  Preferred narcotic agents for pain control are hydromorphone, fentanyl, and methadone. Morphine should not be used. Avoid Baclofen and avoid oral sodium phosphate and magnesium citrate based laxatives / bowel preps. Continue strict Input and Output monitoring. Will monitor the patient closely with you and intervene or adjust therapy as indicated by changes in clinical status/labs  Resp distress, COPD, concern for multifocal PNA. Bcx pending, abx per primary. Resp panel neg Hyperkalemia - due to #1. Improved, continue to monitor Metabolic acidosis - due to #1.  Stopping PO bicarb, will manage with HD, improved HTN - stable, UF  astolerated DM type 2 - per primary PVD - stable Anemia of CKD - last hgb 9.2. Recommend work up-will defer to primary service. started Fe load CKD MBD: renvela, renal diet, PTH pending  Subjective:   Patient seen and examined bedside. Sleepy and hungry. Just got back from OR. S/p lue bc avf creation.   Objective:   BP (!) 97/53 (BP Location: Right Arm)   Pulse 85   Temp (!) 97.5 F (36.4 C) (Oral)   Resp 19   Ht 5\' 10"  (1.778 m)   Wt 86.5 kg   SpO2 94%   BMI 27.36 kg/m   Intake/Output Summary (Last 24 hours) at 02/14/2024 1452 Last data filed at 02/14/2024 1230 Gross per 24 hour  Intake 460 ml  Output 2620 ml  Net -2160 ml   Weight change:   Physical Exam: Gen: NAD CVS: RRR Resp: unlabored,  normal wob Abd: soft, nt/nd Ext: trace pitting edema b/l Les Neuro: awake, alert Dialysis access: right TDC, lue avf in place  Imaging: DG Chest 1 View Result Date: 02/13/2024 CLINICAL DATA:  [id number] S/P thoracentesis [id number] EXAM: PORTABLE CHEST 1 VIEW COMPARISON:  Chest XR, 02/12/2024.  CT chest, 11/02/2010. FINDINGS: Support lines: RIGHT chest tunneled dialysis catheter, well-positioned with tip at the expected location of the superior cavoatrial junction. Cardiac silhouette is within normal limits. Aortic arch atherosclerosis. Improved aeration of the RIGHT lung with no pneumothorax or significant residual pleural effusion trace LEFT pleural effusion. Retrocardiac opacity. Chronic LEFT mid clavicular deformity. No acute osseous abnormality. IMPRESSION: 1. Improved aeration of the RIGHT lung post thoracentesis. No pneumothorax or significant residual pleural effusion. 2. Retrocardiac opacity, most likely to represent atelectasis Electronically Signed   By: Roanna Banning M.D.   On: 02/13/2024 15:49   IR THORACENTESIS ASP PLEURAL SPACE W/IMG GUIDE Result Date: 02/13/2024 INDICATION: 78 year old male. History of end-stage renal disease. Admitted for acute respiratory failure with hypoxia. Found to have a right-sided pleural effusion request is for therapeutic and diagnostic right-sided thoracentesis EXAM: ULTRASOUND GUIDED RIGHT-SIDED THERAPEUTIC AND DIAGNOSTIC THORACENTESIS MEDICATIONS: Lidocaine 1% 10 mL COMPLICATIONS: None immediate. PROCEDURE: An ultrasound guided thoracentesis was thoroughly discussed with the patient and questions answered. The benefits, risks, alternatives and complications were also discussed. The patient understands and wishes to proceed with the procedure. Written consent was obtained. Ultrasound was performed to localize and mark an adequate pocket  of fluid in the right chest. The area was then prepped and draped in the normal sterile fashion. 1% Lidocaine was used for local  anesthesia. Under ultrasound guidance a 6 Fr Safe-T-Centesis catheter was introduced. Thoracentesis was performed. The catheter was removed and a dressing applied. FINDINGS: A total of approximately 1 L of straw-colored fluid was removed. Samples were sent to the laboratory as requested by the clinical team. IMPRESSION: Successful ultrasound guided diagnostic and therapeutic RIGHT thoracentesis yielding 1 L of pleural fluid. Performed by Anders Grant NP Electronically Signed   By: Roanna Banning M.D.   On: 02/13/2024 15:46    Labs: BMET Recent Labs  Lab 02/10/24 1920 02/11/24 0522 02/11/24 1513 02/12/24 0354 02/13/24 0712 02/14/24 0744 02/14/24 0918  NA 136 139 135 137 135 135 135  K 5.9* 5.7* 5.6* 5.7* 5.1 4.2 4.1  CL 103 105 104 104 100 99 99  CO2 14* 16* 15* 16* 18* 23  --   GLUCOSE 126* 121* 240* 166* 114* 81 85  BUN 104* 104* 102* 114* 95* 60* 55*  CREATININE 11.09* 10.30* 10.80* 11.27* 9.16* 5.99* 6.80*  CALCIUM 8.3* 7.5* 7.9* 8.1* 8.7* 8.3*  --   PHOS  --  10.5* 10.6* 10.0* 9.3* 6.8*  --    CBC Recent Labs  Lab 02/10/24 1920 02/11/24 0522 02/11/24 1513 02/14/24 0728 02/14/24 0918  WBC 8.7 8.1 8.3 11.9*  --   NEUTROABS  --   --   --  9.6*  --   HGB 11.0* 8.9* 9.6* 8.8* 9.2*  HCT 35.4* 28.3* 30.3* 26.7* 27.0*  MCV 95.2 94.0 94.1 91.1  --   PLT 328 275 293 256  --     Medications:     amLODipine  10 mg Oral QHS   Chlorhexidine Gluconate Cloth  6 each Topical Q0600   doxycycline  100 mg Oral Q12H   fentaNYL (SUBLIMAZE) injection  50 mcg Intravenous Once   heparin  5,000 Units Subcutaneous Q8H   hydrALAZINE  25 mg Oral BID   insulin aspart  0-5 Units Subcutaneous QHS   insulin aspart  0-6 Units Subcutaneous TID WC   ipratropium-albuterol  3 mL Nebulization Q6H   levothyroxine  75 mcg Oral Daily   memantine  10 mg Oral BID   nicotine  7 mg Transdermal Daily   sevelamer carbonate  800 mg Oral TID WC   sodium bicarbonate  650 mg Oral BID      Anthony Sar, MD [facility] 02/14/2024, 2:52 PM

## 2024-02-14 NOTE — Plan of Care (Signed)

## 2024-02-14 NOTE — Assessment & Plan Note (Addendum)
 02-14-2024 had left UE AVF creation today.  02-16-2024 AVF won't be ready to use for at least 6 months.

## 2024-02-15 ENCOUNTER — Encounter (HOSPITAL_COMMUNITY): Payer: Self-pay | Admitting: Internal Medicine

## 2024-02-15 ENCOUNTER — Inpatient Hospital Stay (HOSPITAL_COMMUNITY)

## 2024-02-15 DIAGNOSIS — J90 Pleural effusion, not elsewhere classified: Secondary | ICD-10-CM | POA: Diagnosis not present

## 2024-02-15 DIAGNOSIS — N186 End stage renal disease: Secondary | ICD-10-CM | POA: Diagnosis not present

## 2024-02-15 DIAGNOSIS — J9601 Acute respiratory failure with hypoxia: Secondary | ICD-10-CM | POA: Diagnosis not present

## 2024-02-15 DIAGNOSIS — N179 Acute kidney failure, unspecified: Secondary | ICD-10-CM | POA: Diagnosis not present

## 2024-02-15 DIAGNOSIS — Z993 Dependence on wheelchair: Secondary | ICD-10-CM

## 2024-02-15 LAB — RENAL FUNCTION PANEL
Albumin: 3 g/dL — ABNORMAL LOW (ref 3.5–5.0)
Anion gap: 17 — ABNORMAL HIGH (ref 5–15)
BUN: 79 mg/dL — ABNORMAL HIGH (ref 8–23)
CO2: 19 mmol/L — ABNORMAL LOW (ref 22–32)
Calcium: 8.4 mg/dL — ABNORMAL LOW (ref 8.9–10.3)
Chloride: 99 mmol/L (ref 98–111)
Creatinine, Ser: 6.99 mg/dL — ABNORMAL HIGH (ref 0.61–1.24)
GFR, Estimated: 7 mL/min — ABNORMAL LOW (ref >60)
Glucose, Bld: 84 mg/dL (ref 70–99)
Phosphorus: 8.1 mg/dL — ABNORMAL HIGH (ref 2.5–4.6)
Potassium: 4.5 mmol/L (ref 3.5–5.1)
Sodium: 135 mmol/L (ref 135–145)

## 2024-02-15 LAB — PARATHYROID HORMONE, INTACT (NO CA): PTH: 333 pg/mL — ABNORMAL HIGH (ref 15–65)

## 2024-02-15 LAB — CBC WITH DIFFERENTIAL/PLATELET
Abs Immature Granulocytes: 0.1 10*3/uL — ABNORMAL HIGH (ref 0.00–0.07)
Basophils Absolute: 0 10*3/uL (ref 0.0–0.1)
Basophils Relative: 0 %
Eosinophils Absolute: 0 10*3/uL (ref 0.0–0.5)
Eosinophils Relative: 0 %
HCT: 25.6 % — ABNORMAL LOW (ref 39.0–52.0)
Hemoglobin: 8.4 g/dL — ABNORMAL LOW (ref 13.0–17.0)
Immature Granulocytes: 1 %
Lymphocytes Relative: 14 %
Lymphs Abs: 1.4 10*3/uL (ref 0.7–4.0)
MCH: 29.8 pg (ref 26.0–34.0)
MCHC: 32.8 g/dL (ref 30.0–36.0)
MCV: 90.8 fL (ref 80.0–100.0)
Monocytes Absolute: 1.2 10*3/uL — ABNORMAL HIGH (ref 0.1–1.0)
Monocytes Relative: 11 %
Neutro Abs: 7.5 10*3/uL (ref 1.7–7.7)
Neutrophils Relative %: 74 %
Platelets: 283 10*3/uL (ref 150–400)
RBC: 2.82 MIL/uL — ABNORMAL LOW (ref 4.22–5.81)
RDW: 15 % (ref 11.5–15.5)
WBC: 10.2 10*3/uL (ref 4.0–10.5)
nRBC: 0.4 % — ABNORMAL HIGH (ref 0.0–0.2)

## 2024-02-15 LAB — GLUCOSE, CAPILLARY
Glucose-Capillary: 133 mg/dL — ABNORMAL HIGH (ref 70–99)
Glucose-Capillary: 68 mg/dL — ABNORMAL LOW (ref 70–99)
Glucose-Capillary: 84 mg/dL (ref 70–99)
Glucose-Capillary: 89 mg/dL (ref 70–99)
Glucose-Capillary: 98 mg/dL (ref 70–99)

## 2024-02-15 MED ORDER — HEPARIN SODIUM (PORCINE) 1000 UNIT/ML IJ SOLN
3800.0000 [IU] | Freq: Once | INTRAMUSCULAR | Status: AC
  Administered 2024-02-15: 3800 [IU]
  Filled 2024-02-15: qty 4

## 2024-02-15 MED ORDER — CEFADROXIL 500 MG PO CAPS: 500.0000 mg | ORAL_CAPSULE | Freq: Every day | ORAL | 0 refills | Status: AC

## 2024-02-15 MED ORDER — IPRATROPIUM-ALBUTEROL 0.5-2.5 (3) MG/3ML IN SOLN: 3.0000 mL | Freq: Four times a day (QID) | RESPIRATORY_TRACT | 0 refills | Status: DC | PRN

## 2024-02-15 MED ORDER — HYDRALAZINE HCL 25 MG PO TABS: 25.0000 mg | ORAL_TABLET | Freq: Two times a day (BID) | ORAL | 0 refills | Status: DC

## 2024-02-15 MED ORDER — SEVELAMER CARBONATE 800 MG PO TABS: 800.0000 mg | ORAL_TABLET | Freq: Three times a day (TID) | ORAL | 0 refills | Status: AC

## 2024-02-15 NOTE — Progress Notes (Signed)
 SATURATION QUALIFICATIONS: (This note is used to comply with regulatory documentation for home oxygen)  Patient Saturations on Room Air at Rest = 82%  Patient Saturations on Room Air while Ambulating =pt does not ambulate at base line  Patient Saturations on 4 Liters of oxygen at rest= 93%  Please briefly explain why patient needs home oxygen: saturation 82% on room air

## 2024-02-15 NOTE — Progress Notes (Signed)
 [facility] Progress Note    Assessment/ Plan:     AKI/CKD stage IV, now ESRD - Ruled out obstruction with CT renal stone study.  Losartan d/c'ed. Followed by Dr. Arrie Aran as an outpatient Started HD given worsening labs and constellation of symptoms concerning for uremia. TDC w/ IR and HD#1 2/26. HD#2 2/27 and then #3 today S/p LUE BC AVF w/ Dr. Randie Heinz 2/28-appreciate assistance CLIP: [facility]alita Levan [facility] TTS 11:55am chair time (start 3/4) Avoid nephrotoxic medications including NSAIDs and iodinated intravenous contrast exposure unless the latter is absolutely indicated.  Preferred narcotic agents for pain control are hydromorphone, fentanyl, and methadone. Morphine should not be used. Avoid Baclofen and avoid oral sodium phosphate and magnesium citrate based laxatives / bowel preps. Continue strict Input and Output monitoring. Will monitor the patient closely with you and intervene or adjust therapy as indicated by changes in clinical status/labs  AHRF, COPD,pleural effusion, concern for PNA- s/p rt thora 2/27: 1L drained; abx per primary Hyperkalemia - due to #1. Improved, continue to monitor Metabolic acidosis - due to #1.  Stopped PO bicarb, will manage with HD, improved/stable HTN - stable, UF  astolerated DM type 2 - per primary PVD - stable Anemia of CKD - last hgb 9.2. Recommend work up-will defer to primary service. started Fe load CKD MBD: renvela, renal diet, PTH [id number]  Okay for discharge tomorrow if needed, discussed with primary service  Subjective:   Patient seen and examined bedside. No complaints for me. Reports that his breathing is better   Objective:   BP 124/80   Pulse 95   Temp 98 F (36.7 C)   Resp 18   Ht 5\' 10"  (1.778 m)   Wt 86.5 kg   SpO2 95%   BMI 27.36 kg/m   Intake/Output Summary (Last 24 hours) at 02/15/2024 1406 Last data filed at 02/15/2024 0532 Gross per 24 hour  Intake --  Output 140 ml  Net -140 ml   Weight change:   Physical  Exam: Gen: NAD CVS: RRR Resp: unlabored, normal wob, diminished air entry bibasilar Abd: soft, nt/nd Ext: no edema b/l Les Neuro: awake, alert Dialysis access: right TDC, lue avf in place  Imaging: DG CHEST PORT 1 VIEW Result Date: 02/15/2024 CLINICAL DATA:  Hypoxia. EXAM: PORTABLE CHEST 1 VIEW COMPARISON:  02/13/2024 FINDINGS: Right chest wall dialysis catheter is identified with tips at the superior cavoatrial junction. Stable cardiomediastinal contours. Small bilateral pleural effusions. Retrocardiac opacity within the left base is unchanged. New asymmetric airspace disease within the right upper lobe. Remote left clavicle fracture. IMPRESSION: 1. New asymmetric airspace disease within the right upper lobe compatible with pneumonia. 2. Small bilateral pleural effusions. 3. Persistent retrocardiac opacity within the left base. Electronically Signed   By: Signa Kell M.D.   On: 02/15/2024 08:53   DG Chest 1 View Result Date: 02/13/2024 CLINICAL DATA:  [id number] S/P thoracentesis [id number] EXAM: PORTABLE CHEST 1 VIEW COMPARISON:  Chest XR, 02/12/2024.  CT chest, 11/02/2010. FINDINGS: Support lines: RIGHT chest tunneled dialysis catheter, well-positioned with tip at the expected location of the superior cavoatrial junction. Cardiac silhouette is within normal limits. Aortic arch atherosclerosis. Improved aeration of the RIGHT lung with no pneumothorax or significant residual pleural effusion trace LEFT pleural effusion. Retrocardiac opacity. Chronic LEFT mid clavicular deformity. No acute osseous abnormality. IMPRESSION: 1. Improved aeration of the RIGHT lung post thoracentesis. No pneumothorax or significant residual pleural effusion. 2. Retrocardiac opacity, most likely to represent atelectasis Electronically Signed   By:  Roanna Banning M.D.   On: 02/13/2024 15:49   IR THORACENTESIS ASP PLEURAL SPACE W/IMG GUIDE Result Date: 02/13/2024 INDICATION: 78 year old male. History of end-stage renal disease.  Admitted for acute respiratory failure with hypoxia. Found to have a right-sided pleural effusion request is for therapeutic and diagnostic right-sided thoracentesis EXAM: ULTRASOUND GUIDED RIGHT-SIDED THERAPEUTIC AND DIAGNOSTIC THORACENTESIS MEDICATIONS: Lidocaine 1% 10 mL COMPLICATIONS: None immediate. PROCEDURE: An ultrasound guided thoracentesis was thoroughly discussed with the patient and questions answered. The benefits, risks, alternatives and complications were also discussed. The patient understands and wishes to proceed with the procedure. Written consent was obtained. Ultrasound was performed to localize and mark an adequate pocket of fluid in the right chest. The area was then prepped and draped in the normal sterile fashion. 1% Lidocaine was used for local anesthesia. Under ultrasound guidance a 6 Fr Safe-T-Centesis catheter was introduced. Thoracentesis was performed. The catheter was removed and a dressing applied. FINDINGS: A total of approximately 1 L of straw-colored fluid was removed. Samples were sent to the laboratory as requested by the clinical team. IMPRESSION: Successful ultrasound guided diagnostic and therapeutic RIGHT thoracentesis yielding 1 L of pleural fluid. Performed by Anders Grant NP Electronically Signed   By: Roanna Banning M.D.   On: 02/13/2024 15:46    Labs: BMET Recent Labs  Lab 02/10/24 1920 02/11/24 0522 02/11/24 1513 02/12/24 0354 02/13/24 8469 02/14/24 0744 02/14/24 0918 02/15/24 0711  NA 136 139 135 137 135 135 135 135  K 5.9* 5.7* 5.6* 5.7* 5.1 4.2 4.1 4.5  CL 103 105 104 104 100 99 99 99  CO2 14* 16* 15* 16* 18* 23  --  19*  GLUCOSE 126* 121* 240* 166* 114* 81 85 84  BUN 104* 104* 102* 114* 95* 60* 55* 79*  CREATININE 11.09* 10.30* 10.80* 11.27* 9.16* 5.99* 6.80* 6.99*  CALCIUM 8.3* 7.5* 7.9* 8.1* 8.7* 8.3*  --  8.4*  PHOS  --  10.5* 10.6* 10.0* 9.3* 6.8*  --  8.1*   CBC Recent Labs  Lab 02/10/24 1920 02/11/24 0522 02/11/24 1513  02/14/24 0728 02/14/24 0918  WBC 8.7 8.1 8.3 11.9*  --   NEUTROABS  --   --   --  9.6*  --   HGB 11.0* 8.9* 9.6* 8.8* 9.2*  HCT 35.4* 28.3* 30.3* 26.7* 27.0*  MCV 95.2 94.0 94.1 91.1  --   PLT 328 275 293 256  --     Medications:     amLODipine  10 mg Oral QHS   cefadroxil  500 mg Oral Daily   Chlorhexidine Gluconate Cloth  6 each Topical Q0600   doxycycline  100 mg Oral Q12H   heparin  5,000 Units Subcutaneous Q8H   hydrALAZINE  25 mg Oral BID   insulin aspart  0-5 Units Subcutaneous QHS   insulin aspart  0-6 Units Subcutaneous TID WC   ipratropium-albuterol  3 mL Nebulization Q6H   levothyroxine  75 mcg Oral Daily   memantine  10 mg Oral BID   nicotine  7 mg Transdermal Daily   sevelamer carbonate  800 mg Oral TID WC      Anthony Sar, MD [facility] 02/15/2024, 2:06 PM

## 2024-02-15 NOTE — Progress Notes (Signed)
 Received patient in bed to unit.  Alert and oriented.  Informed consent signed and in chart.   TX duration:3 hours  Patient tolerated well.  Transported back to the room  Alert, without acute distress.  Hand-off given to patient's nurse.   Access used: dialysis cath Access issues: none  Total UF removed: 2400 Medication(s) given: heplock 1.9 units per port Post HD VS: see table below Post HD weight: 77.3kg   02/15/24 2137  Vitals  BP 115/72  MAP (mmHg) 86  BP Location Right Arm  BP Method Automatic  Patient Position (if appropriate) Lying  Pulse Rate (!) 103  Pulse Rate Source Monitor  ECG Heart Rate (!) 114  Resp (!) 23  Oxygen Therapy  SpO2 90 %  O2 Device Nasal Cannula  O2 Flow Rate (L/min) 4 L/min  Patient Activity (if Appropriate) In bed  Pulse Oximetry Type Continuous  During Treatment Monitoring  Blood Flow Rate (mL/min) 0 mL/min  Arterial Pressure (mmHg) -84.64 mmHg  Venous Pressure (mmHg) 15.35 mmHg  TMP (mmHg) 24.04 mmHg  Ultrafiltration Rate (mL/min) 1543 mL/min  Dialysate Flow Rate (mL/min) 300 ml/min  Dialysate Potassium Concentration 3  Dialysate Calcium Concentration 2.5  Duration of HD Treatment -hour(s) 2.97 hour(s)  Cumulative Fluid Removed (mL) per Treatment  2396.8  HD Safety Checks Performed Yes  Intra-Hemodialysis Comments See progress note (post rinseback)  Post Treatment  Dialyzer Clearance Lightly streaked  Hemodialysis Intake (mL) 0 mL  Liters Processed 53.3  Fluid Removed (mL) 2400 mL  Tolerated HD Treatment Yes  Post-Hemodialysis Comments goal met  Hemodialysis Catheter Right Internal jugular Double lumen Permanent (Tunneled)  Placement Date/Time: 02/12/24 0859   Placed prior to admission: No  Serial / Lot #: [id number]  Expiration Date: 06/15/28  Time Out: Correct patient;Correct site;Correct procedure  Maximum sterile barrier precautions: Hand hygiene;Cap;Mask;Sterile gown...  Site Condition No complications  Blue Lumen Status  Flushed;Heparin locked;Dead end cap in place  Red Lumen Status Flushed;Dead end cap in place;Heparin locked  Purple Lumen Status N/A  Catheter fill solution Heparin 1000 units/ml  Catheter fill volume (Arterial) 1.9 cc  Catheter fill volume (Venous) 1.9  Post treatment catheter status Capped and Clamped      [facility]

## 2024-02-15 NOTE — Plan of Care (Signed)

## 2024-02-15 NOTE — Progress Notes (Signed)
 PROGRESS NOTE    Burgess Amor.  MWN:[id number] DOB: Feb 20, 1946 DOA: 02/10/2024 PCP: Paulina Fusi, MD  Subjective: Pt seen and examined.   Weaned to RA. Discussed with pt's wife and sister-in-law. Pt will be ready to DC tomorrow. Renal coordinator has found pt an outpatient HD slot.  at [facility] on TTS 11:55 am chair time.  Suppose to go to HD#3 today.  Told wife that plan is to DC to home tomorrow.   Hospital Course: HPI: Joshuajames Moehring. is a 78 y.o. male with medical history significant for DM which does not require medication, htn and ESRD.  The patient does not know what his baseline creatinine is but he tells me that the nephrologist have been talking to him about the possibility of hemodialysis for his last couple of visits he had a routine visit today and had blood work.  He says he went home and was called on the phone and told to come to the emergency room right away.  Patient has been unable to urinate all day today despite trying.  He denies any chest pain or shortness of breath or fevers or chills.  In the emergency department the patient's blood work revealed a creatinine of 11 and a potassium of 5.9.  The bladder scan revealed only 250 mL of urine and the patient was unable to urinate.  Urology was called from the emergency department.  He will be admitted likely will require initiation of HD during this hospital stay.  Significant Events: Admitted 02/10/2024 acute on CKD stage 5(not on HD)   Significant Labs: WBC 8.7, HgB 11.0, plt 328 Na 136, K 5.9, CO2 of 14, BUN 104, scr 11.09, glu 126  Significant Imaging Studies: CT renal stone Cholelithiasis, without associated inflammatory changes. Mild left colonic diverticulosis, without evidence of diverticulitis. Small bilateral pleural effusions. Additional ancillary findings as above.Aortic Atherosclerosis  CXR Multifocal right lung opacities, raising concern for mild multifocal pneumonia. Superimposed  bilateral lower lobe atelectasis. Layering small to moderate right pleural effusion.  Antibiotic Therapy: Anti-infectives (From admission, onward)    Start     Dose/Rate Route Frequency Ordered Stop   02/12/24 1000  ceFAZolin (ANCEF) IVPB 2g/100 mL premix       Note to Pharmacy: Admin in IR only   2 g 200 mL/hr over 30 Minutes Intravenous  Once 02/11/24 1450     02/12/24 0400  doxycycline (VIBRAMYCIN) 100 mg in sodium chloride 0.9 % 250 mL IVPB        100 mg 125 mL/hr over 120 Minutes Intravenous 2 times daily 02/12/24 0258     02/12/24 0300  cefTRIAXone (ROCEPHIN) 1 g in sodium chloride 0.9 % 100 mL IVPB        1 g 200 mL/hr over 30 Minutes Intravenous Daily at bedtime 02/12/24 0258        Procedures: 02-12-2024 right HD catheter placement  Consultants: Nephrology    Assessment and Plan: * Acute renal failure superimposed on stage 5 chronic kidney disease, not on chronic dialysis (HCC) 02-12-2024 seen by nephrology. See "ESRD on dialysis"  ESRD (end stage renal disease) on dialysis (HCC) 02-12-2024 had HD catheter placed today. Nephrology planning back-to-back HD sessions.  [place] Will need renal coordinator to start outpt HD slot search. 02-13-2024 dialysis coordinator has started outpatient HD slot search 02-14-2024 continue HD per nephrology. He is now S/P left AVF creation today. Won't be ready to use for at least 6 months. Dialysis  coordinator working on outpatient HD slot.  02-15-2024 outpatient HD slot secured at [facility] on TTS 11:55 am chair time. Pt can start on Tuesday, March 4 and will need to arrive at 10:55 am to complete paperwork prior to treatment.   Going for HD session #3 today.  Pt is now s/p left AVF. Will need to mature before it can be used for outpatient HD.  Acute respiratory failure with hypoxia (HCC) 02-12-2024 events of last night noted.  Now on 5 L/min supplemental O2. Not on home O2. Had bilateral pleural effusions on CT renal  stone 02-10-2024.  Flu/covid/RSV negative. Checking RVP.  No fevers. No leukocytosis.  Procal only mildly elevated. Don't think this is really pneumonia. Most likely volume overload from ESRD. Hopefully HD will help this that. Appears pt is going to have back-to-back HD session over next 2 days. Will re-evaluate pleural effusion. May need thoracentesis after 2 HD sessions. Discussed pt, wife and sister-in-law. Continue abx for now. 02-13-2024 remains on 4 L/min. No fevers. Pt with moderate/large right pleural effusion. My estimates are about [phone] ml of pleural fluid on the right side. Will send pt to U/S tomorrow for right side thoracentesis. Pt is agreeable. He is not on any systemic anticoagulants. Still don't think he has pneumonia. Repeat CBC and procal in AM. Will probably stop IV abx tomorrow. 02-14-2024 IR able to get his right thoracentesis done yesterday. As expected he has 1000 ml of pleural fluid removed from right pleural space. Appears to be transudate. Procal minimally elevated. Can change to po abx. Wean O2 to RA.  02-15-2024 weaned to RA. Right thoracentesis helped out a lot.  Metabolic acidosis 02-12-2024 on po bicarb bid. 02-13-2024 serum CO2 of 18 today. On bid bicarb. CO2 was 16 yesterday. 02-14-2024 resolved with PO bicarb and HD. CO2 today was 23  02-15-2024 nephrology has stopped po bicarb. Acidosis will be managed with HD.  Hyperkalemia 02-12-2024 give po lokelma 02-13-2024 K 5.1 today. Pt going to HD again today.  02-14-2024 resolved. K 4.2  Bilateral pleural effusion - s/p right thoracentesis on 02-13-2024 for 1000 ml. 02-12-2024 seen on CT renal stone 02-09-2025. I think his enlarging bilateral pleural effusions R > L is likely responsible for his acute respiratory failure with hypoxia rather than pneumonia. Will see if HD is able to remove some of the effusion after 2 HD session. If he is still on O2 after 2 HD session, may need to proceed with thoracentesis to remove  effusions. 02-13-2024 on bedside thoracic U/S today by this writer, pt has mod/large right pleural effusion(estimated 750-163ml) and smaller left pleural effusion. Will order thoracentesis for tomorrow on right side. 02-14-2024 IR able to get his right thoracentesis done yesterday. As expected he has 1000 ml of pleural fluid removed. Appears to be transudate. Procal minimally elevated. Can change to po abx.  02-15-2024 stable. Left pleural effusion probably not large enough to make a difference in his breathing.  Essential hypertension 02-12-2024 on hydralazine 25 mg bid, norvasc 10 mg at bedtime.. Will need to see how his BP reacts to HD. 02-13-2024 s/p 2 liters removed with UF yesterday. Going for HD session #2 today.  Continue to monitor BP. Hold off on adding any further HTN meds until he is euvolemic on his volume status. 02-14-2024 stable. Pt limited to 60 ounces of fluid per day. BP low today. May need to hold some of his BP meds.  02-15-2024 continue at bedtime novasc and hydralazine bid.  S/P  arteriovenous (AV) fistula creation - Left UE. 02-14-2024. 02-14-2024 had left UE AVF creation today.  Dependent for wheelchair mobility 02-12-2024 pt has been wheelchair dependent for about 9-10 years. After his back surgery. Able to transfer to wheelchair. Discussed with pt, wife and sister-in-law. 02-13-2024 chronic. 02-14-2024 chronic. DC plan is to home.  02-15-2024 stable. Chronic. Confirmed with wife that pt will be able to go home tomorrow.  Diabetes mellitus without complication (HCC) 02-12-2024 add SSI. ESRD scale. 02-13-2024 stable. 02-14-2024 stable. CBG acceptable ranges.  02-15-2024 stable. Chronic.  Dementia (HCC) 02-12-2024 stable. On namenda bid. Seems pretty appropriate today. Monitor for hospital acquired delirium. 02-13-2024 pt is AxOx4 today. 02-14-2024 stable. He is AxOx4 today.  02-15-2024 stable. Chronic.  COPD (chronic obstructive pulmonary disease)  (HCC) 02-12-2024 stable. Not wheezing. No exacerbation. Checking RVP to make sure he doesn't have any other viral illnesses.[name] RVP/flu/covid is negative. Stop steroids. 02-13-2024 pt is not wheezing. His COPD is not exacerbated. 02-14-2024 stable.  02-15-2024 stable. Chronic.  History of anemia due to chronic kidney disease 02-12-2024 defer to nephrology if he needs epo 02-13-2024 check CBC in AM.  02-14-2024 HgB 8.8 g/dl today.   DVT prophylaxis: heparin injection 5,000 Units Start: 02/11/24 1400    Code Status: Full Code Family Communication: discussed with pt, wife and sister-in-law Disposition Plan: return home Reason for continuing need for hospitalization: getting HD today. Home tomorrow.  Objective: Vitals:   02/15/24 0420 02/15/24 0600 02/15/24 0742 02/15/24 0947  BP:   124/80 124/80  Pulse:   95   Resp:   18   Temp:   98 F (36.7 C)   TempSrc:      SpO2: 90% 92% 95%   Weight:      Height:        Intake/Output Summary (Last 24 hours) at 02/15/2024 1415 Last data filed at 02/15/2024 0532 Gross per 24 hour  Intake --  Output 140 ml  Net -140 ml   Filed Weights   02/12/24 1551 02/12/24 1826 02/14/24 0850  Weight: 87.4 kg (S) 86.5 kg 86.5 kg    Examination:  Physical Exam Vitals and nursing note reviewed.  Constitutional:      General: He is not in acute distress.    Appearance: He is obese. He is not toxic-appearing or diaphoretic.  HENT:     Head: Normocephalic and atraumatic.     Nose: Nose normal.  Cardiovascular:     Rate and Rhythm: Normal rate and regular rhythm.  Pulmonary:     Effort: Pulmonary effort is normal. No respiratory distress.     Breath sounds: No rales.     Comments: Scattered rhonchi. No distress. Rhonchi clears with coughing. Abdominal:     General: Bowel sounds are normal. There is no distension.     Palpations: Abdomen is soft.  Musculoskeletal:     Right lower leg: No edema.     Left lower leg: No edema.  Skin:    General:  Skin is warm and dry.     Capillary Refill: Capillary refill takes less than 2 seconds.  Neurological:     Mental Status: He is alert.    Data Reviewed: I have personally reviewed following labs and imaging studies  CBC: Recent Labs  Lab 02/10/24 1920 02/11/24 0522 02/11/24 1513 02/14/24 0728 02/14/24 0918  WBC 8.7 8.1 8.3 11.9*  --   NEUTROABS  --   --   --  9.6*  --   HGB 11.0* 8.9* 9.6* 8.8*  9.2*  HCT 35.4* 28.3* 30.3* 26.7* 27.0*  MCV 95.2 94.0 94.1 91.1  --   PLT 328 275 293 256  --    Basic Metabolic Panel: Recent Labs  Lab 02/11/24 1513 02/12/24 0343 02/12/24 0354 02/13/24 0712 02/14/24 0744 02/14/24 0918 02/15/24 0711  NA 135  --  137 135 135 135 135  K 5.6*  --  5.7* 5.1 4.2 4.1 4.5  CL 104  --  104 100 99 99 99  CO2 15*  --  16* 18* 23  --  19*  GLUCOSE 240*  --  166* 114* 81 85 84  BUN 102*  --  114* 95* 60* 55* 79*  CREATININE 10.80*  --  11.27* 9.16* 5.99* 6.80* 6.99*  CALCIUM 7.9*  --  8.1* 8.7* 8.3*  --  8.4*  MG  --  2.1  --   --   --   --   --   PHOS 10.6*  --  10.0* 9.3* 6.8*  --  8.1*   GFR: Estimated Creatinine Clearance: 9 mL/min (A) (by C-G formula based on SCr of 6.99 mg/dL (H)). Liver Function Tests: Recent Labs  Lab 02/10/24 1920 02/11/24 0522 02/11/24 1513 02/12/24 0354 02/13/24 0712 02/14/24 0744 02/14/24 0840 02/15/24 0711  AST 14* 10*  --   --   --   --  14*  --   ALT 13 13  --   --   --   --  6  --   ALKPHOS 80 61  --   --   --   --  52  --   BILITOT 0.4 0.6  --   --   --   --  0.5  --   PROT 6.8 5.5*  --   --   --   --  6.1*  --   ALBUMIN 3.3* 2.6*   < > 3.0* 3.0* 2.9* 2.9* 3.0*   < > = values in this interval not displayed.   Recent Labs  Lab 02/10/24 1920  LIPASE 51   HbA1C: Recent Labs    02/12/24 1948  HGBA1C 5.3   CBG: Recent Labs  Lab 02/14/24 1601 02/14/24 1952 02/15/24 0006 02/15/24 0402 02/15/24 0741  GLUCAP 112* 114* 68* 84 89   Sepsis Labs: Recent Labs  Lab 02/12/24 0343 02/14/24 0840   PROCALCITON 0.17 0.34    Recent Results (from the past 240 hours)  MRSA Next Gen by PCR, Nasal     Status: None   Collection Time: 02/12/24  2:59 AM   Specimen: Nasal Mucosa; Nasal Swab  Result Value Ref Range Status   MRSA by PCR Next Gen NOT DETECTED NOT DETECTED Final    Comment: (NOTE) The GeneXpert MRSA Assay (FDA approved for NASAL specimens only), is one component of a comprehensive MRSA colonization surveillance program. It is not intended to diagnose MRSA infection nor to guide or monitor treatment for MRSA infections. Test performance is not FDA approved in patients less than 43 years old. Performed at [facility], [address], [place], [place] [address]   Culture, blood (Routine X 2) w Reflex to ID Panel     Status: None (Preliminary result)   Collection Time: 02/12/24  3:43 AM   Specimen: BLOOD  Result Value Ref Range Status   Specimen Description BLOOD BLOOD LEFT ARM  Final   Special Requests   Final    BOTTLES DRAWN AEROBIC AND ANAEROBIC Blood Culture results may not be optimal  due to an inadequate volume of blood received in culture bottles   Culture   Final    NO GROWTH 3 DAYS Performed at [facility], [address], [place], [place] [address]    Report Status PENDING  Incomplete  Culture, blood (Routine X 2) w Reflex to ID Panel     Status: None (Preliminary result)   Collection Time: 02/12/24  3:54 AM   Specimen: BLOOD  Result Value Ref Range Status   Specimen Description BLOOD BLOOD RIGHT HAND  Final   Special Requests   Final    BOTTLES DRAWN AEROBIC AND ANAEROBIC Blood Culture adequate volume   Culture   Final    NO GROWTH 3 DAYS Performed at [facility], [address] [place], [place] [address]    Report Status PENDING  Incomplete  Resp panel by RT-PCR (RSV, Flu A&B, Covid) Anterior Nasal Swab     Status: None   Collection Time: 02/12/24 10:48 AM   Specimen: Anterior Nasal Swab  Result Value Ref Range Status   SARS  Coronavirus 2 by RT PCR NEGATIVE NEGATIVE Final   Influenza A by PCR NEGATIVE NEGATIVE Final   Influenza B by PCR NEGATIVE NEGATIVE Final    Comment: (NOTE) The Xpert Xpress SARS-CoV-2/FLU/RSV plus assay is intended as an aid in the diagnosis of influenza from Nasopharyngeal swab specimens and should not be used as a sole basis for treatment. Nasal washings and aspirates are unacceptable for Xpert Xpress SARS-CoV-2/FLU/RSV testing.  Fact Sheet for Patients: BloggerCourse.com  Fact Sheet for Healthcare Providers: SeriousBroker.it  This test is not yet approved or cleared by the [place] FDA and has been authorized for detection and/or diagnosis of SARS-CoV-2 by FDA under an Emergency Use Authorization (EUA). This EUA will remain in effect (meaning this test can be used) for the duration of the COVID-19 declaration under Section 564(b)(1) of the Act, 21 U.S.C. section 360bbb-3(b)(1), unless the authorization is terminated or revoked.     Resp Syncytial Virus by PCR NEGATIVE NEGATIVE Final    Comment: (NOTE) Fact Sheet for Patients: BloggerCourse.com  Fact Sheet for Healthcare Providers: SeriousBroker.it  This test is not yet approved or cleared by the [place] FDA and has been authorized for detection and/or diagnosis of SARS-CoV-2 by FDA under an Emergency Use Authorization (EUA). This EUA will remain in effect (meaning this test can be used) for the duration of the COVID-19 declaration under Section 564(b)(1) of the Act, 21 U.S.C. section 360bbb-3(b)(1), unless the authorization is terminated or revoked.  Performed at [facility], [address], [place], [place] [address]   Respiratory (~20 pathogens) panel by PCR     Status: None   Collection Time: 02/12/24  2:30 PM   Specimen: Nasopharyngeal Swab; Respiratory  Result Value Ref Range Status   Adenovirus  NOT DETECTED NOT DETECTED Final   Coronavirus 229E NOT DETECTED NOT DETECTED Final    Comment: (NOTE) The Coronavirus on the Respiratory Panel, DOES NOT test for the novel  Coronavirus (2019 nCoV)    Coronavirus HKU1 NOT DETECTED NOT DETECTED Final   Coronavirus NL63 NOT DETECTED NOT DETECTED Final   Coronavirus OC43 NOT DETECTED NOT DETECTED Final   Metapneumovirus NOT DETECTED NOT DETECTED Final   Rhinovirus / Enterovirus NOT DETECTED NOT DETECTED Final   Influenza A NOT DETECTED NOT DETECTED Final   Influenza B NOT DETECTED NOT DETECTED Final   Parainfluenza Virus 1 NOT DETECTED NOT DETECTED Final  Parainfluenza Virus 2 NOT DETECTED NOT DETECTED Final   Parainfluenza Virus 3 NOT DETECTED NOT DETECTED Final   Parainfluenza Virus 4 NOT DETECTED NOT DETECTED Final   Respiratory Syncytial Virus NOT DETECTED NOT DETECTED Final   Bordetella pertussis NOT DETECTED NOT DETECTED Final   Bordetella Parapertussis NOT DETECTED NOT DETECTED Final   Chlamydophila pneumoniae NOT DETECTED NOT DETECTED Final   Mycoplasma pneumoniae NOT DETECTED NOT DETECTED Final    Comment: Performed at [facility], [address], [place], [place] [address]     Radiology Studies: DG CHEST PORT 1 VIEW Result Date: 02/15/2024 CLINICAL DATA:  Hypoxia. EXAM: PORTABLE CHEST 1 VIEW COMPARISON:  02/13/2024 FINDINGS: Right chest wall dialysis catheter is identified with tips at the superior cavoatrial junction. Stable cardiomediastinal contours. Small bilateral pleural effusions. Retrocardiac opacity within the left base is unchanged. New asymmetric airspace disease within the right upper lobe. Remote left clavicle fracture. IMPRESSION: 1. New asymmetric airspace disease within the right upper lobe compatible with pneumonia. 2. Small bilateral pleural effusions. 3. Persistent retrocardiac opacity within the left base. Electronically Signed   By: Signa Kell M.D.   On: 02/15/2024 08:53   DG Chest 1 View Result  Date: 02/13/2024 CLINICAL DATA:  [id number] S/P thoracentesis [id number] EXAM: PORTABLE CHEST 1 VIEW COMPARISON:  Chest XR, 02/12/2024.  CT chest, 11/02/2010. FINDINGS: Support lines: RIGHT chest tunneled dialysis catheter, well-positioned with tip at the expected location of the superior cavoatrial junction. Cardiac silhouette is within normal limits. Aortic arch atherosclerosis. Improved aeration of the RIGHT lung with no pneumothorax or significant residual pleural effusion trace LEFT pleural effusion. Retrocardiac opacity. Chronic LEFT mid clavicular deformity. No acute osseous abnormality. IMPRESSION: 1. Improved aeration of the RIGHT lung post thoracentesis. No pneumothorax or significant residual pleural effusion. 2. Retrocardiac opacity, most likely to represent atelectasis Electronically Signed   By: Roanna Banning M.D.   On: 02/13/2024 15:49   IR THORACENTESIS ASP PLEURAL SPACE W/IMG GUIDE Result Date: 02/13/2024 INDICATION: 78 year old male. History of end-stage renal disease. Admitted for acute respiratory failure with hypoxia. Found to have a right-sided pleural effusion request is for therapeutic and diagnostic right-sided thoracentesis EXAM: ULTRASOUND GUIDED RIGHT-SIDED THERAPEUTIC AND DIAGNOSTIC THORACENTESIS MEDICATIONS: Lidocaine 1% 10 mL COMPLICATIONS: None immediate. PROCEDURE: An ultrasound guided thoracentesis was thoroughly discussed with the patient and questions answered. The benefits, risks, alternatives and complications were also discussed. The patient understands and wishes to proceed with the procedure. Written consent was obtained. Ultrasound was performed to localize and mark an adequate pocket of fluid in the right chest. The area was then prepped and draped in the normal sterile fashion. 1% Lidocaine was used for local anesthesia. Under ultrasound guidance a 6 Fr Safe-T-Centesis catheter was introduced. Thoracentesis was performed. The catheter was removed and a dressing applied.  FINDINGS: A total of approximately 1 L of straw-colored fluid was removed. Samples were sent to the laboratory as requested by the clinical team. IMPRESSION: Successful ultrasound guided diagnostic and therapeutic RIGHT thoracentesis yielding 1 L of pleural fluid. Performed by Anders Grant NP Electronically Signed   By: Roanna Banning M.D.   On: 02/13/2024 15:46    Scheduled Meds:  amLODipine  10 mg Oral QHS   cefadroxil  500 mg Oral Daily   Chlorhexidine Gluconate Cloth  6 each Topical Q0600   doxycycline  100 mg Oral Q12H   heparin  5,000 Units Subcutaneous Q8H   hydrALAZINE  25 mg Oral BID   insulin aspart  0-5 Units  Subcutaneous QHS   insulin aspart  0-6 Units Subcutaneous TID WC   ipratropium-albuterol  3 mL Nebulization Q6H   levothyroxine  75 mcg Oral Daily   memantine  10 mg Oral BID   nicotine  7 mg Transdermal Daily   sevelamer carbonate  800 mg Oral TID WC   Continuous Infusions:  albuterol     iron sucrose Stopped (02/13/24 2046)     LOS: 4 days   Time spent: 45 minutes  Carollee Herter, DO  [facility]  02/15/2024, 2:15 PM

## 2024-02-16 ENCOUNTER — Encounter: Payer: Self-pay | Admitting: Internal Medicine

## 2024-02-16 DIAGNOSIS — Z9889 Other specified postprocedural states: Secondary | ICD-10-CM

## 2024-02-16 DIAGNOSIS — N179 Acute kidney failure, unspecified: Secondary | ICD-10-CM | POA: Diagnosis not present

## 2024-02-16 DIAGNOSIS — J90 Pleural effusion, not elsewhere classified: Secondary | ICD-10-CM | POA: Diagnosis not present

## 2024-02-16 DIAGNOSIS — J9601 Acute respiratory failure with hypoxia: Secondary | ICD-10-CM | POA: Diagnosis not present

## 2024-02-16 DIAGNOSIS — N186 End stage renal disease: Secondary | ICD-10-CM | POA: Diagnosis not present

## 2024-02-16 LAB — RENAL FUNCTION PANEL
Albumin: 3 g/dL — ABNORMAL LOW (ref 3.5–5.0)
Anion gap: 20 — ABNORMAL HIGH (ref 5–15)
BUN: 43 mg/dL — ABNORMAL HIGH (ref 8–23)
CO2: 23 mmol/L (ref 22–32)
Calcium: 9.3 mg/dL (ref 8.9–10.3)
Chloride: 93 mmol/L — ABNORMAL LOW (ref 98–111)
Creatinine, Ser: 4.71 mg/dL — ABNORMAL HIGH (ref 0.61–1.24)
GFR, Estimated: 12 mL/min — ABNORMAL LOW (ref >60)
Glucose, Bld: 85 mg/dL (ref 70–99)
Phosphorus: 5.9 mg/dL — ABNORMAL HIGH (ref 2.5–4.6)
Potassium: 4 mmol/L (ref 3.5–5.1)
Sodium: 136 mmol/L (ref 135–145)

## 2024-02-16 LAB — GLUCOSE, CAPILLARY: Glucose-Capillary: 79 mg/dL (ref 70–99)

## 2024-02-16 NOTE — Plan of Care (Signed)

## 2024-02-16 NOTE — Progress Notes (Signed)
 RNCM received DME order for Oxygen and nebulizer machine.  [facility] [employer] contacted with order and confirmation received.  DME to be delivered to patient's room prior to d/c home today

## 2024-02-16 NOTE — Progress Notes (Signed)
 Per Dr. Thedore Mins with nephrology remove foley prior to discharge and no need for void trial since patient has minimal output due to ESRD.

## 2024-02-16 NOTE — Discharge Instructions (Signed)
 1.Follow up with routine dialysis.  On your first outpatient dialysis appointment on February 18, 2024(Tuesday). You need to get to the dialysis center early(10:55 AM) in order to complete papework.   2. Routine dialysis on Tuesdays, Thursdays and Saturdays @ 11:55 AM.  Arrive early to get setup.   3. Follow up with your primary care provider in 1-2 weeks following discharge from hospital.

## 2024-02-16 NOTE — Progress Notes (Signed)
 [facility] Progress Note    Assessment/ Plan:     AKI/CKD stage IV, now ESRD - Ruled out obstruction with CT renal stone study.  Losartan d/c'ed. Followed by Dr. Arrie Aran as an outpatient Started HD given worsening labs and constellation of symptoms concerning for uremia. TDC w/ IR and HD#1 2/26. HD#2 2/27 and then #3 3/1. TTS from now on S/p LUE BC AVF w/ Dr. Randie Heinz 2/28-appreciate assistance CLIP: Rosalita Levan [facility]kc TTS 11:55am chair time (start 3/4) Avoid nephrotoxic medications including NSAIDs and iodinated intravenous contrast exposure unless the latter is absolutely indicated.  Preferred narcotic agents for pain control are hydromorphone, fentanyl, and methadone. Morphine should not be used. Avoid Baclofen and avoid oral sodium phosphate and magnesium citrate based laxatives / bowel preps. Continue strict Input and Output monitoring. Will monitor the patient closely with you and intervene or adjust therapy as indicated by changes in clinical status/labs  AHRF, COPD,pleural effusion, concern for PNA- s/p rt thora 2/27: 1L drained; abx per primary Hyperkalemia - due to #1. Improved, continue to monitor Metabolic acidosis - due to #1.  Stopped PO bicarb, will manage with HD, improved/stable HTN - stable, UF  astolerated DM type 2 - per primary PVD - stable Anemia of CKD - last hgb 8.4. Recommend work up-will defer to primary service. started Fe load CKD MBD: renvela, renal diet, PTH 333-okay. Po4 better down to 5.9  Okay for discharge, would like to prove that he can sit in a recliner for at least a few hours prior to discharge. Wheelchair bound at baseline apparently Discussed with floor rn and primary service  Subjective:   Patient seen and examined bedside. No complaints for me. Eager to go home. Hasn't sat in a chair since being here.   Objective:   BP 101/72 (BP Location: Right Wrist)   Pulse 65   Temp 98 F (36.7 C) (Oral)   Resp 20   Ht 5\' 10"  (1.778 m)   Wt  77.3 kg   SpO2 90%   BMI 24.45 kg/m   Intake/Output Summary (Last 24 hours) at 02/16/2024 7829 Last data filed at 02/16/2024 0840 Gross per 24 hour  Intake 60 ml  Output 2400 ml  Net -2340 ml   Weight change: -6.3 kg  Physical Exam: Gen: NAD CVS: RRR Resp: unlabored, normal wob, laying flat in bed Abd: soft, nt/nd Ext: no edema b/l Les Neuro: awake, alert Dialysis access: right TDC, lue avf in place  Imaging: DG CHEST PORT 1 VIEW Result Date: 02/15/2024 CLINICAL DATA:  Hypoxia. EXAM: PORTABLE CHEST 1 VIEW COMPARISON:  02/13/2024 FINDINGS: Right chest wall dialysis catheter is identified with tips at the superior cavoatrial junction. Stable cardiomediastinal contours. Small bilateral pleural effusions. Retrocardiac opacity within the left base is unchanged. New asymmetric airspace disease within the right upper lobe. Remote left clavicle fracture. IMPRESSION: 1. New asymmetric airspace disease within the right upper lobe compatible with pneumonia. 2. Small bilateral pleural effusions. 3. Persistent retrocardiac opacity within the left base. Electronically Signed   By: Signa Kell M.D.   On: 02/15/2024 08:53    Labs: BMET Recent Labs  Lab 02/11/24 0522 02/11/24 1513 02/12/24 0354 02/13/24 5621 02/14/24 0744 02/14/24 0918 02/15/24 0711 02/16/24 0716  NA 139 135 137 135 135 135 135 136  K 5.7* 5.6* 5.7* 5.1 4.2 4.1 4.5 4.0  CL 105 104 104 100 99 99 99 93*  CO2 16* 15* 16* 18* 23  --  19* 23  GLUCOSE 121* 240*  166* 114* 81 85 84 85  BUN 104* 102* 114* 95* 60* 55* 79* 43*  CREATININE 10.30* 10.80* 11.27* 9.16* 5.99* 6.80* 6.99* 4.71*  CALCIUM 7.5* 7.9* 8.1* 8.7* 8.3*  --  8.4* 9.3  PHOS 10.5* 10.6* 10.0* 9.3* 6.8*  --  8.1* 5.9*   CBC Recent Labs  Lab 02/11/24 0522 02/11/24 1513 02/14/24 0728 02/14/24 0918 02/15/24 1535  WBC 8.1 8.3 11.9*  --  10.2  NEUTROABS  --   --  9.6*  --  7.5  HGB 8.9* 9.6* 8.8* 9.2* 8.4*  HCT 28.3* 30.3* 26.7* 27.0* 25.6*  MCV 94.0 94.1  91.1  --  90.8  PLT 275 293 256  --  283    Medications:     amLODipine  10 mg Oral QHS   cefadroxil  500 mg Oral Daily   Chlorhexidine Gluconate Cloth  6 each Topical Q0600   doxycycline  100 mg Oral Q12H   heparin  5,000 Units Subcutaneous Q8H   hydrALAZINE  25 mg Oral BID   insulin aspart  0-5 Units Subcutaneous QHS   insulin aspart  0-6 Units Subcutaneous TID WC   ipratropium-albuterol  3 mL Nebulization Q6H   levothyroxine  75 mcg Oral Daily   memantine  10 mg Oral BID   nicotine  7 mg Transdermal Daily   sevelamer carbonate  800 mg Oral TID WC      Anthony Sar, MD [facility] 02/16/2024, 9:18 AM

## 2024-02-16 NOTE — Plan of Care (Signed)

## 2024-02-16 NOTE — Progress Notes (Signed)
 PROGRESS NOTE    Joseph Hebert.  RUE:[id number] DOB: 1946/09/20 DOA: 02/10/2024 PCP: Paulina Fusi, MD  Subjective: Pt seen and examined.   Qualified for home O2 yesterday.  RA sats at rest 82%. On 4 L/min with sats 93%. Pt does not ambulate at baseline.  Nephrology wants pt to be able to sit in chair for 3-4 hours to simulate the time he needs to sit in a chair during outpatient HD.   Hospital Course: HPI: Joseph Isa. is a 78 y.o. male with medical history significant for DM which does not require medication, htn and ESRD.  The patient does not know what his baseline creatinine is but he tells me that the nephrologist have been talking to him about the possibility of hemodialysis for his last couple of visits he had a routine visit today and had blood work.  He says he went home and was called on the phone and told to come to the emergency room right away.  Patient has been unable to urinate all day today despite trying.  He denies any chest pain or shortness of breath or fevers or chills.  In the emergency department the patient's blood work revealed a creatinine of 11 and a potassium of 5.9.  The bladder scan revealed only 250 mL of urine and the patient was unable to urinate.  Urology was called from the emergency department.  He will be admitted likely will require initiation of HD during this hospital stay.  Significant Events: Admitted 02/10/2024 acute on CKD stage 5(not on HD)   Significant Labs: WBC 8.7, HgB 11.0, plt 328 Na 136, K 5.9, CO2 of 14, BUN 104, scr 11.09, glu 126  Significant Imaging Studies: CT renal stone Cholelithiasis, without associated inflammatory changes. Mild left colonic diverticulosis, without evidence of diverticulitis. Small bilateral pleural effusions. Additional ancillary findings as above.Aortic Atherosclerosis  CXR Multifocal right lung opacities, raising concern for mild multifocal pneumonia. Superimposed bilateral lower lobe  atelectasis. Layering small to moderate right pleural effusion.  Antibiotic Therapy: Anti-infectives (From admission, onward)    Start     Dose/Rate Route Frequency Ordered Stop   02/12/24 1000  ceFAZolin (ANCEF) IVPB 2g/100 mL premix       Note to Pharmacy: Admin in IR only   2 g 200 mL/hr over 30 Minutes Intravenous  Once 02/11/24 1450     02/12/24 0400  doxycycline (VIBRAMYCIN) 100 mg in sodium chloride 0.9 % 250 mL IVPB        100 mg 125 mL/hr over 120 Minutes Intravenous 2 times daily 02/12/24 0258     02/12/24 0300  cefTRIAXone (ROCEPHIN) 1 g in sodium chloride 0.9 % 100 mL IVPB        1 g 200 mL/hr over 30 Minutes Intravenous Daily at bedtime 02/12/24 0258        Procedures: 02-12-2024 right HD catheter placement  Consultants: Nephrology    Assessment and Plan: * Acute renal failure superimposed on stage 5 chronic kidney disease, not on chronic dialysis (HCC) 02-12-2024 seen by nephrology. See "ESRD on dialysis"  ESRD (end stage renal disease) on dialysis (HCC) 02-12-2024 had HD catheter placed today. Nephrology planning back-to-back HD sessions.  Pt lives in [place]. Will need renal coordinator to start outpt HD slot search. 02-13-2024 dialysis coordinator has started outpatient HD slot search 02-14-2024 continue HD per nephrology. He is now S/P left AVF creation today. Won't be ready to use for at least 6 months. Dialysis coordinator working  on outpatient HD slot. 02-15-2024 outpatient HD slot secured at [facility] on TTS 11:55 am chair time. Pt can start on Tuesday, March 4 and will need to arrive at 10:55 am to complete paperwork prior to treatment.   Going for HD session #3 today.  Pt is now s/p left AVF. Will need to mature before it can be used for outpatient HD.  02-16-2024 pt to sit in recliner for 3-4 hours today to simulate the time he needs to sit in a recliner during outpatient HD. If he is able to do this, he can be discharged to home.  Acute respiratory  failure with hypoxia (HCC) 02-12-2024 events of last night noted.  Now on 5 L/min supplemental O2. Not on home O2. Had bilateral pleural effusions on CT renal stone 02-10-2024.  Flu/covid/RSV negative. Checking RVP.  No fevers. No leukocytosis.  Procal only mildly elevated. Don't think this is really pneumonia. Most likely volume overload from ESRD. Hopefully HD will help this that. Appears pt is going to have back-to-back HD session over next 2 days. Will re-evaluate pleural effusion. May need thoracentesis after 2 HD sessions. Discussed pt, wife and sister-in-law. Continue abx for now. 02-13-2024 remains on 4 L/min. No fevers. Pt with moderate/large right pleural effusion. My estimates are about [phone] ml of pleural fluid on the right side. Will send pt to U/S tomorrow for right side thoracentesis. Pt is agreeable. He is not on any systemic anticoagulants. Still don't think he has pneumonia. Repeat CBC and procal in AM. Will probably stop IV abx tomorrow. 02-14-2024 IR able to get his right thoracentesis done yesterday. As expected he has 1000 ml of pleural fluid removed from right pleural space. Appears to be transudate. Procal minimally elevated. Can change to po abx. Wean O2 to RA.  02-15-2024 weaned to RA. Right thoracentesis helped out a lot.  02-16-2024 pt qualified for home O2 yesterday with RA sats of 82%. Needed 4 L/min of O2. Will go home with supplemental O2.  Metabolic acidosis 02-12-2024 on po bicarb bid. 02-13-2024 serum CO2 of 18 today. On bid bicarb. CO2 was 16 yesterday. 02-14-2024 resolved with PO bicarb and HD. CO2 today was 23  02-15-2024 nephrology has stopped po bicarb. Acidosis will be managed with HD.  Hyperkalemia 02-12-2024 give po lokelma 02-13-2024 K 5.1 today. Pt going to HD again today.  02-14-2024 resolved. K 4.2  Bilateral pleural effusion - s/p right thoracentesis on 02-13-2024 for 1000 ml. 02-12-2024 seen on CT renal stone 02-09-2025. I think his enlarging  bilateral pleural effusions R > L is likely responsible for his acute respiratory failure with hypoxia rather than pneumonia. Will see if HD is able to remove some of the effusion after 2 HD session. If he is still on O2 after 2 HD session, may need to proceed with thoracentesis to remove effusions. 02-13-2024 on bedside thoracic U/S today by this writer, pt has mod/large right pleural effusion(estimated 750-155ml) and smaller left pleural effusion. Will order thoracentesis for tomorrow on right side. 02-14-2024 IR able to get his right thoracentesis done yesterday. As expected he has 1000 ml of pleural fluid removed. Appears to be transudate. Procal minimally elevated. Can change to po abx.  02-15-2024 stable. Left pleural effusion probably not large enough to make a difference in his breathing.  Essential hypertension 02-12-2024 on hydralazine 25 mg bid, norvasc 10 mg at bedtime.. Will need to see how his BP reacts to HD. 02-13-2024 s/p 2 liters removed with UF yesterday. Going for  HD session #2 today.  Continue to monitor BP. Hold off on adding any further HTN meds until he is euvolemic on his volume status. 02-14-2024 stable. Pt limited to 60 ounces of fluid per day. BP low today. May need to hold some of his BP meds.  02-15-2024 continue at bedtime novasc and hydralazine bid.  02-16-2024 stable.  S/P arteriovenous (AV) fistula creation - Left UE. 02-14-2024. 02-14-2024 had left UE AVF creation today.  02-16-2024 AVF won't be ready to use for at least 6 months.  Dependent for wheelchair mobility 02-12-2024 pt has been wheelchair dependent for about 9-10 years. After his back surgery. Able to transfer to wheelchair. Discussed with pt, wife and sister-in-law. 02-13-2024 chronic. 02-14-2024 chronic. DC plan is to home.  02-15-2024 stable. Chronic. Confirmed with wife that pt will be able to go home tomorrow.  Diabetes mellitus without complication (HCC) 02-12-2024 add SSI. ESRD  scale. 02-13-2024 stable. 02-14-2024 stable. CBG acceptable ranges.  02-15-2024 stable. Chronic.  02-16-2024 CBG acceptable ranges  Dementia (HCC) 02-12-2024 stable. On namenda bid. Seems pretty appropriate today. Monitor for hospital acquired delirium. 02-13-2024 pt is AxOx4 today. 02-14-2024 stable. He is AxOx4 today.  02-15-2024 stable. Chronic.  02-16-2024 chronic.  COPD (chronic obstructive pulmonary disease) (HCC) 02-12-2024 stable. Not wheezing. No exacerbation. Checking RVP to make sure he doesn't have any other viral illnesses.[name] RVP/flu/covid is negative. Stop steroids. 02-13-2024 pt is not wheezing. His COPD is not exacerbated. 02-14-2024 stable.  02-15-2024 stable. Chronic.  02-16-2024 stable. Not exacerbated.  History of anemia due to chronic kidney disease 02-12-2024 defer to nephrology if he needs epo 02-13-2024 check CBC in AM.  02-14-2024 HgB 8.8 g/dl today. 02-15-2024 HgB 8.4 g/dl today.  DVT prophylaxis: heparin injection 5,000 Units Start: 02/11/24 1400    Code Status: Full Code Family Communication: no family at bedside. Discussed with wife on 02-15-2024. Disposition Plan: return home Reason for continuing need for hospitalization: medically stable for DC  Objective: Vitals:   02/15/24 2153 02/15/24 2230 02/15/24 2300 02/16/24 0558  BP:   129/71 101/72  Pulse:   86 65  Resp:   (!) 22 20  Temp:  97.8 F (36.6 C)  98 F (36.7 C)  TempSrc:    Oral  SpO2:   92% 90%  Weight: 77.3 kg     Height:        Intake/Output Summary (Last 24 hours) at 02/16/2024 1256 Last data filed at 02/16/2024 0840 Gross per 24 hour  Intake 60 ml  Output 2400 ml  Net -2340 ml   Filed Weights   02/14/24 0850 02/15/24 1738 02/15/24 2153  Weight: 86.5 kg 80.2 kg 77.3 kg    Examination:  Physical Exam Vitals and nursing note reviewed.  Constitutional:      General: He is not in acute distress.    Appearance: He is not toxic-appearing.     Comments: Chronically  ill appearing  HENT:     Head: Normocephalic and atraumatic.  Cardiovascular:     Rate and Rhythm: Normal rate and regular rhythm.  Pulmonary:     Effort: Pulmonary effort is normal. No respiratory distress.  Abdominal:     General: Bowel sounds are normal.     Palpations: Abdomen is soft.  Musculoskeletal:     Right lower leg: No edema.     Left lower leg: No edema.  Skin:    General: Skin is warm and dry.     Capillary Refill: Capillary refill takes less than 2 seconds.  Neurological:     Comments: Mildly confused.  Conversant. No dysarthria.     Data Reviewed: I have personally reviewed following labs and imaging studies  CBC: Recent Labs  Lab 02/10/24 1920 02/11/24 0522 02/11/24 1513 02/14/24 0728 02/14/24 0918 02/15/24 1535  WBC 8.7 8.1 8.3 11.9*  --  10.2  NEUTROABS  --   --   --  9.6*  --  7.5  HGB 11.0* 8.9* 9.6* 8.8* 9.2* 8.4*  HCT 35.4* 28.3* 30.3* 26.7* 27.0* 25.6*  MCV 95.2 94.0 94.1 91.1  --  90.8  PLT 328 275 293 256  --  283   Basic Metabolic Panel: Recent Labs  Lab 02/12/24 0343 02/12/24 0354 02/13/24 0712 02/14/24 0744 02/14/24 0918 02/15/24 0711 02/16/24 0716  NA  --  137 135 135 135 135 136  K  --  5.7* 5.1 4.2 4.1 4.5 4.0  CL  --  104 100 99 99 99 93*  CO2  --  16* 18* 23  --  19* 23  GLUCOSE  --  166* 114* 81 85 84 85  BUN  --  114* 95* 60* 55* 79* 43*  CREATININE  --  11.27* 9.16* 5.99* 6.80* 6.99* 4.71*  CALCIUM  --  8.1* 8.7* 8.3*  --  8.4* 9.3  MG 2.1  --   --   --   --   --   --   PHOS  --  10.0* 9.3* 6.8*  --  8.1* 5.9*   GFR: Estimated Creatinine Clearance: 13.3 mL/min (A) (by C-G formula based on SCr of 4.71 mg/dL (H)). Liver Function Tests: Recent Labs  Lab 02/10/24 1920 02/11/24 0522 02/11/24 1513 02/13/24 0712 02/14/24 0744 02/14/24 0840 02/15/24 0711 02/16/24 0716  AST 14* 10*  --   --   --  14*  --   --   ALT 13 13  --   --   --  6  --   --   ALKPHOS 80 61  --   --   --  52  --   --   BILITOT 0.4 0.6  --    --   --  0.5  --   --   PROT 6.8 5.5*  --   --   --  6.1*  --   --   ALBUMIN 3.3* 2.6*   < > 3.0* 2.9* 2.9* 3.0* 3.0*   < > = values in this interval not displayed.   Recent Labs  Lab 02/10/24 1920  LIPASE 51   CBG: Recent Labs  Lab 02/15/24 0402 02/15/24 0741 02/15/24 1521 02/15/24 2303 02/16/24 0804  GLUCAP 84 89 98 133* 79   Sepsis Labs: Recent Labs  Lab 02/12/24 0343 02/14/24 0840  PROCALCITON 0.17 0.34    Recent Results (from the past 240 hours)  MRSA Next Gen by PCR, Nasal     Status: None   Collection Time: 02/12/24  2:59 AM   Specimen: Nasal Mucosa; Nasal Swab  Result Value Ref Range Status   MRSA by PCR Next Gen NOT DETECTED NOT DETECTED Final    Comment: (NOTE) The GeneXpert MRSA Assay (FDA approved for NASAL specimens only), is one component of a comprehensive MRSA colonization surveillance program. It is not intended to diagnose MRSA infection nor to guide or monitor treatment for MRSA infections. Test performance is not FDA approved in patients less than 27 years old. Performed at [facility], [address], [place], [place] [address]  Culture, blood (Routine X 2) w Reflex to ID Panel     Status: None (Preliminary result)   Collection Time: 02/12/24  3:43 AM   Specimen: BLOOD  Result Value Ref Range Status   Specimen Description BLOOD BLOOD LEFT ARM  Final   Special Requests   Final    BOTTLES DRAWN AEROBIC AND ANAEROBIC Blood Culture results may not be optimal due to an inadequate volume of blood received in culture bottles   Culture   Final    NO GROWTH 4 DAYS Performed at [facility], [address] [place], [place] [address]    Report Status PENDING  Incomplete  Culture, blood (Routine X 2) w Reflex to ID Panel     Status: None (Preliminary result)   Collection Time: 02/12/24  3:54 AM   Specimen: BLOOD  Result Value Ref Range Status   Specimen Description BLOOD BLOOD RIGHT HAND  Final   Special Requests   Final    BOTTLES  DRAWN AEROBIC AND ANAEROBIC Blood Culture adequate volume   Culture   Final    NO GROWTH 4 DAYS Performed at [facility], [address] [place], [place] [address]    Report Status PENDING  Incomplete  Resp panel by RT-PCR (RSV, Flu A&B, Covid) Anterior Nasal Swab     Status: None   Collection Time: 02/12/24 10:48 AM   Specimen: Anterior Nasal Swab  Result Value Ref Range Status   SARS Coronavirus 2 by RT PCR NEGATIVE NEGATIVE Final   Influenza A by PCR NEGATIVE NEGATIVE Final   Influenza B by PCR NEGATIVE NEGATIVE Final    Comment: (NOTE) The Xpert Xpress SARS-CoV-2/FLU/RSV plus assay is intended as an aid in the diagnosis of influenza from Nasopharyngeal swab specimens and should not be used as a sole basis for treatment. Nasal washings and aspirates are unacceptable for Xpert Xpress SARS-CoV-2/FLU/RSV testing.  Fact Sheet for Patients: BloggerCourse.com  Fact Sheet for Healthcare Providers: SeriousBroker.it  This test is not yet approved or cleared by the [place] FDA and has been authorized for detection and/or diagnosis of SARS-CoV-2 by FDA under an Emergency Use Authorization (EUA). This EUA will remain in effect (meaning this test can be used) for the duration of the COVID-19 declaration under Section 564(b)(1) of the Act, 21 U.S.C. section 360bbb-3(b)(1), unless the authorization is terminated or revoked.     Resp Syncytial Virus by PCR NEGATIVE NEGATIVE Final    Comment: (NOTE) Fact Sheet for Patients: BloggerCourse.com  Fact Sheet for Healthcare Providers: SeriousBroker.it  This test is not yet approved or cleared by the [place] FDA and has been authorized for detection and/or diagnosis of SARS-CoV-2 by FDA under an Emergency Use Authorization (EUA). This EUA will remain in effect (meaning this test can be used) for the duration of the COVID-19  declaration under Section 564(b)(1) of the Act, 21 U.S.C. section 360bbb-3(b)(1), unless the authorization is terminated or revoked.  Performed at [facility], [address], [place], [place] [address]   Respiratory (~20 pathogens) panel by PCR     Status: None   Collection Time: 02/12/24  2:30 PM   Specimen: Nasopharyngeal Swab; Respiratory  Result Value Ref Range Status   Adenovirus NOT DETECTED NOT DETECTED Final   Coronavirus 229E NOT DETECTED NOT DETECTED Final    Comment: (NOTE) The Coronavirus on the Respiratory Panel, DOES NOT test for the novel  Coronavirus (2019 nCoV)    Coronavirus HKU1 NOT DETECTED NOT  DETECTED Final   Coronavirus NL63 NOT DETECTED NOT DETECTED Final   Coronavirus OC43 NOT DETECTED NOT DETECTED Final   Metapneumovirus NOT DETECTED NOT DETECTED Final   Rhinovirus / Enterovirus NOT DETECTED NOT DETECTED Final   Influenza A NOT DETECTED NOT DETECTED Final   Influenza B NOT DETECTED NOT DETECTED Final   Parainfluenza Virus 1 NOT DETECTED NOT DETECTED Final   Parainfluenza Virus 2 NOT DETECTED NOT DETECTED Final   Parainfluenza Virus 3 NOT DETECTED NOT DETECTED Final   Parainfluenza Virus 4 NOT DETECTED NOT DETECTED Final   Respiratory Syncytial Virus NOT DETECTED NOT DETECTED Final   Bordetella pertussis NOT DETECTED NOT DETECTED Final   Bordetella Parapertussis NOT DETECTED NOT DETECTED Final   Chlamydophila pneumoniae NOT DETECTED NOT DETECTED Final   Mycoplasma pneumoniae NOT DETECTED NOT DETECTED Final    Comment: Performed at [name], [address], [place], [place] [address]     Radiology Studies: DG CHEST PORT 1 VIEW Result Date: 02/15/2024 CLINICAL DATA:  Hypoxia. EXAM: PORTABLE CHEST 1 VIEW COMPARISON:  02/13/2024 FINDINGS: Right chest wall dialysis catheter is identified with tips at the superior cavoatrial junction. Stable cardiomediastinal contours. Small bilateral pleural effusions. Retrocardiac opacity within the left  base is unchanged. New asymmetric airspace disease within the right upper lobe. Remote left clavicle fracture. IMPRESSION: 1. New asymmetric airspace disease within the right upper lobe compatible with pneumonia. 2. Small bilateral pleural effusions. 3. Persistent retrocardiac opacity within the left base. Electronically Signed   By: Signa Kell M.D.   On: 02/15/2024 08:53    Scheduled Meds:  amLODipine  10 mg Oral QHS   cefadroxil  500 mg Oral Daily   Chlorhexidine Gluconate Cloth  6 each Topical Q0600   doxycycline  100 mg Oral Q12H   heparin  5,000 Units Subcutaneous Q8H   hydrALAZINE  25 mg Oral BID   insulin aspart  0-5 Units Subcutaneous QHS   insulin aspart  0-6 Units Subcutaneous TID WC   ipratropium-albuterol  3 mL Nebulization Q6H   levothyroxine  75 mcg Oral Daily   memantine  10 mg Oral BID   nicotine  7 mg Transdermal Daily   sevelamer carbonate  800 mg Oral TID WC   Continuous Infusions:  albuterol     iron sucrose 200 mg (02/15/24 1451)     LOS: 5 days   Time spent: 40 minutes  Carollee Herter, DO  [facility]  02/16/2024, 12:56 PM

## 2024-02-16 NOTE — Discharge Summary (Signed)
 [facility] Discharge Summary   Patient name: Joseph Hebert.  Admit date:     02/10/2024  Discharge date: 02/16/2024  Attending Physician: Buena Irish [[id number]  Discharge Physician: Carollee Herter   PCP: Paulina Fusi, MD  Admitted From: Home  Disposition:  Home  Recommendations for Outpatient Follow-up:  Follow up with routine dialysis.  On your first outpatient dialysis appointment on February 18, 2024(Tuesday). You need to get to the dialysis center early(10:55 AM) in order to complete papework. Routine dialysis on Tuesdays, Thursdays and Saturdays @ 11:55 AM.  [place] early to get setup.  Follow up with your primary care provider in 1-2 weeks following discharge from hospital.   Home Health:No Equipment/Devices: Oxygen 4 L/min Home nebulizer machine  Discharge Condition:Stable CODE STATUS:FULL Diet recommendation: Renal/Diabetic Fluid Restriction: 1800 ml/day  Hospital Summary: HPI: Joseph Hebert. is a 78 y.o. male with medical history significant for DM which does not require medication, htn and ESRD.  The patient does not know what his baseline creatinine is but he tells me that the nephrologist have been talking to him about the possibility of hemodialysis for his last couple of visits he had a routine visit today and had blood work.  He says he went home and was called on the phone and told to come to the emergency room right away.  Patient has been unable to urinate all day today despite trying.  He denies any chest pain or shortness of breath or fevers or chills.  In the emergency department the patient's blood work revealed a creatinine of 11 and a potassium of 5.9.  The bladder scan revealed only 250 mL of urine and the patient was unable to urinate.  Urology was called from the emergency department.  He will be admitted likely will require initiation of HD during this hospital stay.  Significant Events: Admitted 02/10/2024 acute on CKD stage 5(not  on HD)   Significant Labs: WBC 8.7, HgB 11.0, plt 328 Na 136, K 5.9, CO2 of 14, BUN 104, scr 11.09, glu 126  Significant Imaging Studies: CT renal stone Cholelithiasis, without associated inflammatory changes. Mild left colonic diverticulosis, without evidence of diverticulitis. Small bilateral pleural effusions. Additional ancillary findings as above.Aortic Atherosclerosis  CXR Multifocal right lung opacities, raising concern for mild multifocal pneumonia. Superimposed bilateral lower lobe atelectasis. Layering small to moderate right pleural effusion.  Antibiotic Therapy: Anti-infectives (From admission, onward)    Start     Dose/Rate Route Frequency Ordered Stop   02/12/24 1000  ceFAZolin (ANCEF) IVPB 2g/100 mL premix       Note to Pharmacy: Admin in IR only   2 g 200 mL/hr over 30 Minutes Intravenous  Once 02/11/24 1450     02/12/24 0400  doxycycline (VIBRAMYCIN) 100 mg in sodium chloride 0.9 % 250 mL IVPB        100 mg 125 mL/hr over 120 Minutes Intravenous 2 times daily 02/12/24 0258     02/12/24 0300  cefTRIAXone (ROCEPHIN) 1 g in sodium chloride 0.9 % 100 mL IVPB        1 g 200 mL/hr over 30 Minutes Intravenous Daily at bedtime 02/12/24 0258        Procedures: 02-12-2024 right HD catheter placement 02-14-2024 left UE AV fistular creation  Consultants: Nephrology   Hospital Course by Problem: * Acute renal failure superimposed on stage 5 chronic kidney disease, not on chronic dialysis (HCC) 02-12-2024 seen by nephrology. See "ESRD on dialysis"  ESRD (  end stage renal disease) on dialysis (HCC) 02-12-2024 had HD catheter placed today. Nephrology planning back-to-back HD sessions.  Pt lives in [place]. Will need renal coordinator to start outpt HD slot search. 02-13-2024 dialysis coordinator has started outpatient HD slot search 02-14-2024 continue HD per nephrology. He is now S/P left AVF creation today. Won't be ready to use for at least 6 months. Dialysis coordinator  working on outpatient HD slot. 02-15-2024 outpatient HD slot secured at [facility] [place] on TTS 11:55 am chair time. Pt can start on Tuesday, March 4 and will need to arrive at 10:55 am to complete paperwork prior to treatment.   Going for HD session #3 today.  Pt is now s/p left AVF. Will need to mature before it can be used for outpatient HD.  02-16-2024 pt to sit in recliner for 3-4 hours today to simulate the time he needs to sit in a recliner during outpatient HD. If he is able to do this, he can be discharged to home.  Acute respiratory failure with hypoxia (HCC) 02-12-2024 events of last night noted.  Now on 5 L/min supplemental O2. Not on home O2. Had bilateral pleural effusions on CT renal stone 02-10-2024.  Flu/covid/RSV negative. Checking RVP.  No fevers. No leukocytosis.  Procal only mildly elevated. Don't think this is really pneumonia. Most likely volume overload from ESRD. Hopefully HD will help this that. Appears pt is going to have back-to-back HD session over next 2 days. Will re-evaluate pleural effusion. May need thoracentesis after 2 HD sessions. Discussed pt, wife and sister-in-law. Continue abx for now. 02-13-2024 remains on 4 L/min. No fevers. Pt with moderate/large right pleural effusion. My estimates are about [phone] ml of pleural fluid on the right side. Will send pt to U/S tomorrow for right side thoracentesis. Pt is agreeable. He is not on any systemic anticoagulants. Still don't think he has pneumonia. Repeat CBC and procal in AM. Will probably stop IV abx tomorrow. 02-14-2024 IR able to get his right thoracentesis done yesterday. As expected he has 1000 ml of pleural fluid removed from right pleural space. Appears to be transudate. Procal minimally elevated. Can change to po abx. Wean O2 to RA.  02-15-2024 weaned to RA. Right thoracentesis helped out a lot.  02-16-2024 pt qualified for home O2 yesterday with RA sats of 82%. Needed 4 L/min of O2. Will go home with supplemental  O2.  Metabolic acidosis 02-12-2024 on po bicarb bid. 02-13-2024 serum CO2 of 18 today. On bid bicarb. CO2 was 16 yesterday. 02-14-2024 resolved with PO bicarb and HD. CO2 today was 23  02-15-2024 nephrology has stopped po bicarb. Acidosis will be managed with HD.  Hyperkalemia 02-12-2024 give po lokelma 02-13-2024 K 5.1 today. Pt going to HD again today.  02-14-2024 resolved. K 4.2  Bilateral pleural effusion - s/p right thoracentesis on 02-13-2024 for 1000 ml. 02-12-2024 seen on CT renal stone 02-09-2025. I think his enlarging bilateral pleural effusions R > L is likely responsible for his acute respiratory failure with hypoxia rather than pneumonia. Will see if HD is able to remove some of the effusion after 2 HD session. If he is still on O2 after 2 HD session, may need to proceed with thoracentesis to remove effusions. 02-13-2024 on bedside thoracic U/S today by this writer, pt has mod/large right pleural effusion(estimated 750-160ml) and smaller left pleural effusion. Will order thoracentesis for tomorrow on right side. 02-14-2024 IR able to get his right thoracentesis done yesterday. As expected he has 1000  ml of pleural fluid removed. Appears to be transudate. Procal minimally elevated. Can change to po abx.  02-15-2024 stable. Left pleural effusion probably not large enough to make a difference in his breathing.  Essential hypertension 02-12-2024 on hydralazine 25 mg bid, norvasc 10 mg at bedtime.. Will need to see how his BP reacts to HD. 02-13-2024 s/p 2 liters removed with UF yesterday. Going for HD session #2 today.  Continue to monitor BP. Hold off on adding any further HTN meds until he is euvolemic on his volume status. 02-14-2024 stable. Pt limited to 60 ounces of fluid per day. BP low today. May need to hold some of his BP meds.  02-15-2024 continue at bedtime novasc and hydralazine bid.  02-16-2024 stable.  S/P arteriovenous (AV) fistula creation - Left UE.  02-14-2024. 02-14-2024 had left UE AVF creation today.  02-16-2024 AVF won't be ready to use for at least 6 months.  Dependent for wheelchair mobility 02-12-2024 pt has been wheelchair dependent for about 9-10 years. After his back surgery. Able to transfer to wheelchair. Discussed with pt, wife and sister-in-law. 02-13-2024 chronic. 02-14-2024 chronic. DC plan is to home.  02-15-2024 stable. Chronic. Confirmed with wife that pt will be able to go home tomorrow.  Diabetes mellitus without complication (HCC) 02-12-2024 add SSI. ESRD scale. 02-13-2024 stable. 02-14-2024 stable. CBG acceptable ranges.  02-15-2024 stable. Chronic.  02-16-2024 CBG acceptable ranges  Dementia (HCC) 02-12-2024 stable. On namenda bid. Seems pretty appropriate today. Monitor for hospital acquired delirium. 02-13-2024 pt is AxOx4 today. 02-14-2024 stable. He is AxOx4 today.  02-15-2024 stable. Chronic.  02-16-2024 chronic.  COPD (chronic obstructive pulmonary disease) (HCC) 02-12-2024 stable. Not wheezing. No exacerbation. Checking RVP to make sure he doesn't have any other viral illnesses.[name] RVP/flu/covid is negative. Stop steroids. 02-13-2024 pt is not wheezing. His COPD is not exacerbated. 02-14-2024 stable.  02-15-2024 stable. Chronic.  02-16-2024 stable. Not exacerbated.  History of anemia due to chronic kidney disease 02-12-2024 defer to nephrology if he needs epo 02-13-2024 check CBC in AM.  02-14-2024 HgB 8.8 g/dl today. 02-15-2024 HgB 8.4 g/dl today.   Discharge Diagnoses:  Principal Problem:   Acute renal failure superimposed on stage 5 chronic kidney disease, not on chronic dialysis [facility]) Active Problems:   Acute respiratory failure with hypoxia (HCC)   ESRD (end stage renal disease) on dialysis [facility])   Essential hypertension   Bilateral pleural effusion - s/p right thoracentesis on 02-13-2024 for 1000 ml.   Hyperkalemia   Metabolic acidosis   History of anemia due to chronic  kidney disease   COPD (chronic obstructive pulmonary disease) (HCC)   Dementia (HCC)   Diabetes mellitus without complication (HCC)   Dependent for wheelchair mobility   S/P arteriovenous (AV) fistula creation - Left UE. 02-14-2024.  Discharge Instructions  Discharge Instructions     Call MD for:  difficulty breathing, headache or visual disturbances   Complete by: As directed    Call MD for:  extreme fatigue   Complete by: As directed    Call MD for:  hives   Complete by: As directed    Call MD for:  persistant dizziness or light-headedness   Complete by: As directed    Call MD for:  persistant nausea and vomiting   Complete by: As directed    Call MD for:  redness, tenderness, or signs of infection (pain, swelling, redness, odor or green/yellow discharge around incision site)   Complete by: As directed    Call MD for:  severe uncontrolled pain   Complete by: As directed    Call MD for:  temperature >100.4   Complete by: As directed    Diet renal/carb modified with fluid restriction   Complete by: As directed    60 ounces per daily fluid restriction   Discharge instructions   Complete by: As directed    1. Follow up with routine dialysis.  On your first outpatient dialysis appointment on February 18, 2024(Tuesday). You need to get to the dialysis center early(10:55 AM) in order to complete papework.  2. Routine dialysis on Tuesdays, Thursdays and Saturdays @ 11:55 AM.  Arrive early to get setup.  3. Follow up with your primary care provider in 1-2 weeks following discharge from hospital.   For home use only DME Nebulizer machine   Complete by: As directed    Patient needs a nebulizer to treat with the following condition: COPD (chronic obstructive pulmonary disease) (HCC)   Length of Need: Lifetime   Additional equipment included:  Filter Administration kit     Increase activity slowly   Complete by: As directed    No wound care   Complete by: As directed        Allergies as of 02/16/2024       Reactions   Lopressor [metoprolol Tartrate] Other (See Comments)   Severe bradycardia        Medication List     STOP taking these medications    losartan 100 MG tablet Commonly known as: COZAAR   sodium bicarbonate 650 MG tablet       TAKE these medications    amLODipine 10 MG tablet Commonly known as: NORVASC Take 10 mg by mouth at bedtime. (2100)   aspirin EC 81 MG tablet Take 81 mg by mouth daily. Swallow whole.   atorvastatin 80 MG tablet Commonly known as: LIPITOR Take 80 mg by mouth at bedtime. (2100)   cefadroxil 500 MG capsule Commonly known as: DURICEF Take 1 capsule (500 mg total) by mouth daily for 3 days.   Febuxostat 80 MG Tabs Take 1 tablet by mouth daily.   gabapentin 100 MG capsule Commonly known as: NEURONTIN Take 1 capsule by mouth at bedtime.   hydrALAZINE 25 MG tablet Commonly known as: APRESOLINE Take 1 tablet (25 mg total) by mouth 2 (two) times daily. What changed: Another medication with the same name was added. Make sure you understand how and when to take each.   hydrALAZINE 25 MG tablet Commonly known as: APRESOLINE Take 1 tablet (25 mg total) by mouth 2 (two) times daily. What changed: You were already taking a medication with the same name, and this prescription was added. Make sure you understand how and when to take each.   ipratropium-albuterol 0.5-2.5 (3) MG/3ML Soln Commonly known as: DUONEB Take 3 mLs by nebulization every 6 (six) hours as needed.   levothyroxine 75 MCG tablet Commonly known as: SYNTHROID Take 75 mcg by mouth daily.   memantine 10 MG tablet Commonly known as: NAMENDA Take 1 tablet by mouth 2 (two) times daily.   sevelamer carbonate 800 MG tablet Commonly known as: RENVELA Take 1 tablet (800 mg total) by mouth 3 (three) times daily with meals.               Durable Medical Equipment  (From admission, onward)           Start     Ordered    02/15/24 1549  For home use only DME oxygen  Once       Question Answer Comment  Length of Need Lifetime   Mode or (Route) Nasal cannula   Liters per Minute 2   Frequency Continuous (stationary and portable oxygen unit needed)   Oxygen conserving device Yes   Oxygen delivery system Gas      02/15/24 1548   02/15/24 0000  For home use only DME Nebulizer machine       Question Answer Comment  Patient needs a nebulizer to treat with the following condition COPD (chronic obstructive pulmonary disease) (HCC)   Length of Need Lifetime   Additional equipment included Filter   Additional equipment included Administration kit      02/15/24 1523            Follow-up Information     [facility] Go on 02/18/2024.   Why: Schedule is Tuesday, Thursday, Saturday with 11:55 am chair time.  On Tuesday (3/4), please arrive at 10:55 am to complete paperwork prior to treatment. Contact information: [address] [place] [address] [phone]                Allergies  Allergen Reactions   Lopressor [Metoprolol Tartrate] Other (See Comments)    Severe bradycardia    Discharge Exam: Vitals:   02/15/24 2300 02/16/24 0558  BP: 129/71 101/72  Pulse: 86 65  Resp: (!) 22 20  Temp:  98 F (36.7 C)  SpO2: 92% 90%    Physical Exam Vitals and nursing note reviewed.  Constitutional:      General: He is not in acute distress.    Appearance: He is not toxic-appearing.     Comments: Chronically ill appearing  HENT:     Head: Normocephalic and atraumatic.  Cardiovascular:     Rate and Rhythm: Normal rate and regular rhythm.  Pulmonary:     Effort: Pulmonary effort is normal. No respiratory distress.  Abdominal:     General: Bowel sounds are normal.     Palpations: Abdomen is soft.  Musculoskeletal:     Right lower leg: No edema.     Left lower leg: No edema.  Skin:    General: Skin is warm and dry.     Capillary Refill: Capillary refill takes less than 2  seconds.  Neurological:     Comments: Mildly confused.  Conversant. No dysarthria.    The results of significant diagnostics from this hospitalization (including imaging, microbiology, ancillary and laboratory) are listed below for reference.    Microbiology: Recent Results (from the past 240 hours)  MRSA Next Gen by PCR, Nasal     Status: None   Collection Time: 02/12/24  2:59 AM   Specimen: Nasal Mucosa; Nasal Swab  Result Value Ref Range Status   MRSA by PCR Next Gen NOT DETECTED NOT DETECTED Final    Comment: (NOTE) The GeneXpert MRSA Assay (FDA approved for NASAL specimens only), is one component of a comprehensive MRSA colonization surveillance program. It is not intended to diagnose MRSA infection nor to guide or monitor treatment for MRSA infections. Test performance is not FDA approved in patients less than 61 years old. Performed at [facility], [address] [place], [place] [address]   Culture, blood (Routine X 2) w Reflex to ID Panel     Status: None (Preliminary result)   Collection Time: 02/12/24  3:43 AM   Specimen: BLOOD  Result Value Ref Range Status   Specimen Description BLOOD BLOOD LEFT ARM  Final  Special Requests   Final    BOTTLES DRAWN AEROBIC AND ANAEROBIC Blood Culture results may not be optimal due to an inadequate volume of blood received in culture bottles   Culture   Final    NO GROWTH 4 DAYS Performed at [facility], [address] [place], [place] [address]    Report Status PENDING  Incomplete  Culture, blood (Routine X 2) w Reflex to ID Panel     Status: None (Preliminary result)   Collection Time: 02/12/24  3:54 AM   Specimen: BLOOD  Result Value Ref Range Status   Specimen Description BLOOD BLOOD RIGHT HAND  Final   Special Requests   Final    BOTTLES DRAWN AEROBIC AND ANAEROBIC Blood Culture adequate volume   Culture   Final    NO GROWTH 4 DAYS Performed at [facility], [address] [place], [place]  [address]    Report Status PENDING  Incomplete  Resp panel by RT-PCR (RSV, Flu A&B, Covid) Anterior Nasal Swab     Status: None   Collection Time: 02/12/24 10:48 AM   Specimen: Anterior Nasal Swab  Result Value Ref Range Status   SARS Coronavirus 2 by RT PCR NEGATIVE NEGATIVE Final   Influenza A by PCR NEGATIVE NEGATIVE Final   Influenza B by PCR NEGATIVE NEGATIVE Final    Comment: (NOTE) The Xpert Xpress SARS-CoV-2/FLU/RSV plus assay is intended as an aid in the diagnosis of influenza from Nasopharyngeal swab specimens and should not be used as a sole basis for treatment. Nasal washings and aspirates are unacceptable for Xpert Xpress SARS-CoV-2/FLU/RSV testing.  Fact Sheet for Patients: BloggerCourse.com  Fact Sheet for Healthcare Providers: SeriousBroker.it  This test is not yet approved or cleared by the [place] FDA and has been authorized for detection and/or diagnosis of SARS-CoV-2 by FDA under an Emergency Use Authorization (EUA). This EUA will remain in effect (meaning this test can be used) for the duration of the COVID-19 declaration under Section 564(b)(1) of the Act, 21 U.S.C. section 360bbb-3(b)(1), unless the authorization is terminated or revoked.     Resp Syncytial Virus by PCR NEGATIVE NEGATIVE Final    Comment: (NOTE) Fact Sheet for Patients: BloggerCourse.com  Fact Sheet for Healthcare Providers: SeriousBroker.it  This test is not yet approved or cleared by the [place] FDA and has been authorized for detection and/or diagnosis of SARS-CoV-2 by FDA under an Emergency Use Authorization (EUA). This EUA will remain in effect (meaning this test can be used) for the duration of the COVID-19 declaration under Section 564(b)(1) of the Act, 21 U.S.C. section 360bbb-3(b)(1), unless the authorization is terminated or revoked.  Performed at [facility], [address] [place], [place] [address]   Respiratory (~20 pathogens) panel by PCR     Status: None   Collection Time: 02/12/24  2:30 PM   Specimen: Nasopharyngeal Swab; Respiratory  Result Value Ref Range Status   Adenovirus NOT DETECTED NOT DETECTED Final   Coronavirus 229E NOT DETECTED NOT DETECTED Final    Comment: (NOTE) The Coronavirus on the Respiratory Panel, DOES NOT test for the novel  Coronavirus (2019 nCoV)    Coronavirus HKU1 NOT DETECTED NOT DETECTED Final   Coronavirus NL63 NOT DETECTED NOT DETECTED Final   Coronavirus OC43 NOT DETECTED NOT DETECTED Final   Metapneumovirus NOT DETECTED NOT DETECTED Final   Rhinovirus / Enterovirus NOT DETECTED NOT DETECTED Final   Influenza A NOT DETECTED NOT DETECTED Final  Influenza B NOT DETECTED NOT DETECTED Final   Parainfluenza Virus 1 NOT DETECTED NOT DETECTED Final   Parainfluenza Virus 2 NOT DETECTED NOT DETECTED Final   Parainfluenza Virus 3 NOT DETECTED NOT DETECTED Final   Parainfluenza Virus 4 NOT DETECTED NOT DETECTED Final   Respiratory Syncytial Virus NOT DETECTED NOT DETECTED Final   Bordetella pertussis NOT DETECTED NOT DETECTED Final   Bordetella Parapertussis NOT DETECTED NOT DETECTED Final   Chlamydophila pneumoniae NOT DETECTED NOT DETECTED Final   Mycoplasma pneumoniae NOT DETECTED NOT DETECTED Final    Comment: Performed at [facility], [address], [place], [place] [address]     Labs:  Basic Metabolic Panel: Recent Labs  Lab 02/12/24 [phone] 02/12/24 0354 02/13/24 4098 02/14/24 0744 02/14/24 0918 02/15/24 0711 02/16/24 0716  NA  --  137 135 135 135 135 136  K  --  5.7* 5.1 4.2 4.1 4.5 4.0  CL  --  104 100 99 99 99 93*  CO2  --  16* 18* 23  --  19* 23  GLUCOSE  --  166* 114* 81 85 84 85  BUN  --  114* 95* 60* 55* 79* 43*  CREATININE  --  11.27* 9.16* 5.99* 6.80* 6.99* 4.71*  CALCIUM  --  8.1* 8.7* 8.3*  --  8.4* 9.3  MG 2.1  --   --   --   --   --   --   PHOS  --  10.0*  9.3* 6.8*  --  8.1* 5.9*   Liver Function Tests: Recent Labs  Lab 02/10/24 1920 02/11/24 0522 02/11/24 1513 02/13/24 0712 02/14/24 0744 02/14/24 0840 02/15/24 0711 02/16/24 0716  AST 14* 10*  --   --   --  14*  --   --   ALT 13 13  --   --   --  6  --   --   ALKPHOS 80 61  --   --   --  52  --   --   BILITOT 0.4 0.6  --   --   --  0.5  --   --   PROT 6.8 5.5*  --   --   --  6.1*  --   --   ALBUMIN 3.3* 2.6*   < > 3.0* 2.9* 2.9* 3.0* 3.0*   < > = values in this interval not displayed.   Recent Labs  Lab 02/10/24 1920  LIPASE 51   CBC: Recent Labs  Lab 02/10/24 1920 02/11/24 0522 02/11/24 1513 02/14/24 0728 02/14/24 0918 02/15/24 1535  WBC 8.7 8.1 8.3 11.9*  --  10.2  NEUTROABS  --   --   --  9.6*  --  7.5  HGB 11.0* 8.9* 9.6* 8.8* 9.2* 8.4*  HCT 35.4* 28.3* 30.3* 26.7* 27.0* 25.6*  MCV 95.2 94.0 94.1 91.1  --  90.8  PLT 328 275 293 256  --  283   CBG: Recent Labs  Lab 02/15/24 0402 02/15/24 0741 02/15/24 1521 02/15/24 2303 02/16/24 0804  GLUCAP 84 89 98 133* 79   Urinalysis    Component Value Date/Time   COLORURINE YELLOW 02/11/2024 0136   APPEARANCEUR CLEAR 02/11/2024 0136   LABSPEC 1.012 02/11/2024 0136   PHURINE 6.0 02/11/2024 0136   GLUCOSEU 50 (A) 02/11/2024 0136   HGBUR NEGATIVE 02/11/2024 0136   BILIRUBINUR NEGATIVE 02/11/2024 0136   KETONESUR NEGATIVE 02/11/2024 0136   PROTEINUR >=300 (A) 02/11/2024 0136   UROBILINOGEN 0.2 08/17/2015 1145  NITRITE NEGATIVE 02/11/2024 0136   LEUKOCYTESUR NEGATIVE 02/11/2024 0136   Sepsis Labs Recent Labs  Lab 02/11/24 0522 02/11/24 1513 02/14/24 0728 02/15/24 1535  WBC 8.1 8.3 11.9* 10.2   Procedures/Studies: DG CHEST PORT 1 VIEW Result Date: 02/15/2024 CLINICAL DATA:  Hypoxia. EXAM: PORTABLE CHEST 1 VIEW COMPARISON:  02/13/2024 FINDINGS: Right chest wall dialysis catheter is identified with tips at the superior cavoatrial junction. Stable cardiomediastinal contours. Small bilateral pleural  effusions. Retrocardiac opacity within the left base is unchanged. New asymmetric airspace disease within the right upper lobe. Remote left clavicle fracture. IMPRESSION: 1. New asymmetric airspace disease within the right upper lobe compatible with pneumonia. 2. Small bilateral pleural effusions. 3. Persistent retrocardiac opacity within the left base. Electronically Signed   By: Signa Kell M.D.   On: 02/15/2024 08:53   DG Chest 1 View Result Date: 02/13/2024 CLINICAL DATA:  [id number] S/P thoracentesis [id number] EXAM: PORTABLE CHEST 1 VIEW COMPARISON:  Chest XR, 02/12/2024.  CT chest, 11/02/2010. FINDINGS: Support lines: RIGHT chest tunneled dialysis catheter, well-positioned with tip at the expected location of the superior cavoatrial junction. Cardiac silhouette is within normal limits. Aortic arch atherosclerosis. Improved aeration of the RIGHT lung with no pneumothorax or significant residual pleural effusion trace LEFT pleural effusion. Retrocardiac opacity. Chronic LEFT mid clavicular deformity. No acute osseous abnormality. IMPRESSION: 1. Improved aeration of the RIGHT lung post thoracentesis. No pneumothorax or significant residual pleural effusion. 2. Retrocardiac opacity, most likely to represent atelectasis Electronically Signed   By: Roanna Banning M.D.   On: 02/13/2024 15:49   IR THORACENTESIS ASP PLEURAL SPACE W/IMG GUIDE Result Date: 02/13/2024 INDICATION: 78 year old male. History of end-stage renal disease. Admitted for acute respiratory failure with hypoxia. Found to have a right-sided pleural effusion request is for therapeutic and diagnostic right-sided thoracentesis EXAM: ULTRASOUND GUIDED RIGHT-SIDED THERAPEUTIC AND DIAGNOSTIC THORACENTESIS MEDICATIONS: Lidocaine 1% 10 mL COMPLICATIONS: None immediate. PROCEDURE: An ultrasound guided thoracentesis was thoroughly discussed with the patient and questions answered. The benefits, risks, alternatives and complications were also discussed. The  patient understands and wishes to proceed with the procedure. Written consent was obtained. Ultrasound was performed to localize and mark an adequate pocket of fluid in the right chest. The area was then prepped and draped in the normal sterile fashion. 1% Lidocaine was used for local anesthesia. Under ultrasound guidance a 6 Fr Safe-T-Centesis catheter was introduced. Thoracentesis was performed. The catheter was removed and a dressing applied. FINDINGS: A total of approximately 1 L of straw-colored fluid was removed. Samples were sent to the laboratory as requested by the clinical team. IMPRESSION: Successful ultrasound guided diagnostic and therapeutic RIGHT thoracentesis yielding 1 L of pleural fluid. Performed by Anders Grant NP Electronically Signed   By: Roanna Banning M.D.   On: 02/13/2024 15:46   IR Fluoro Guide CV Line Right Result Date: 02/12/2024 INDICATION: 78 year old male referred for hemodialysis catheter placement EXAM: TUNNELED CENTRAL VENOUS HEMODIALYSIS CATHETER PLACEMENT WITH ULTRASOUND AND FLUOROSCOPIC GUIDANCE MEDICATIONS: 2 g Ancef. The antibiotic was given in an appropriate time interval prior to skin puncture. ANESTHESIA/SEDATION: Moderate (conscious) sedation was employed during this procedure. A total of Versed 1.0 mg and Fentanyl 50 mcg was administered intravenously by the radiology nurse. Total intra-service moderate Sedation Time: 15 minutes. The patient's level of consciousness and vital signs were monitored continuously by radiology nursing throughout the procedure under my direct supervision. FLUOROSCOPY: Radiation Exposure Index (as provided by the fluoroscopic device): 3 mGy Kerma COMPLICATIONS: None PROCEDURE: Informed written consent was obtained  from the patient after a discussion of the risks, benefits, and alternatives to treatment. Questions regarding the procedure were encouraged and answered. The right neck and chest were prepped with chlorhexidine in a sterile  fashion, and a sterile drape was applied covering the operative field. Maximum barrier sterile technique with sterile gowns and gloves were used for the procedure. A timeout was performed prior to the initiation of the procedure. Ultrasound survey was performed. The right internal jugular vein was confirmed to be patent, with images stored and sent to PACS. Micropuncture kit was utilized to access the right internal jugular vein under direct, real-time ultrasound guidance after the overlying soft tissues were anesthetized with 1% lidocaine with epinephrine. Stab incision was made with 11 blade scalpel. Microwire was passed centrally. The microwire was then marked to measure appropriate internal catheter length. External tunneled length was estimated. A total tip to cuff length of 23 cm was selected. 035 guidewire was advanced to the level of the IVC. Skin and subcutaneous tissues of chest wall below the clavicle were generously infiltrated with 1% lidocaine for local anesthesia. A small stab incision was made with 11 blade scalpel. The selected hemodialysis catheter was tunneled in a retrograde fashion from the anterior chest wall to the venotomy incision. Serial dilation was performed and then a peel-away sheath was placed. The catheter was then placed through the peel-away sheath with tips ultimately positioned within the superior aspect of the right atrium. Final catheter positioning was confirmed and documented with a spot radiographic image. The catheter aspirates and flushes normally. The catheter was flushed with appropriate volume heparin dwells. The catheter exit site was secured with a 0-Prolene retention suture. Gel-Foam slurry was infused into the soft tissue tract. The venotomy incision was closed Derma bond and sterile dressing. Dressings were applied at the chest wall. Patient tolerated the procedure well and remained hemodynamically stable throughout. No complications were encountered and no  significant blood loss encountered. IMPRESSION: Status post image guided right IJ tunneled hemodialysis catheter. Signed, Yvone Neu. Miachel Roux, RPVI [facility] Radiology Electronically Signed   By: Gilmer Mor D.O.   On: 02/12/2024 09:16   IR US Guide Vasc Access Right Result Date: 02/12/2024 INDICATION: 78 year old male referred for hemodialysis catheter placement EXAM: TUNNELED CENTRAL VENOUS HEMODIALYSIS CATHETER PLACEMENT WITH ULTRASOUND AND FLUOROSCOPIC GUIDANCE MEDICATIONS: 2 g Ancef. The antibiotic was given in an appropriate time interval prior to skin puncture. ANESTHESIA/SEDATION: Moderate (conscious) sedation was employed during this procedure. A total of Versed 1.0 mg and Fentanyl 50 mcg was administered intravenously by the radiology nurse. Total intra-service moderate Sedation Time: 15 minutes. The patient's level of consciousness and vital signs were monitored continuously by radiology nursing throughout the procedure under my direct supervision. FLUOROSCOPY: Radiation Exposure Index (as provided by the fluoroscopic device): 3 mGy Kerma COMPLICATIONS: None PROCEDURE: Informed written consent was obtained from the patient after a discussion of the risks, benefits, and alternatives to treatment. Questions regarding the procedure were encouraged and answered. The right neck and chest were prepped with chlorhexidine in a sterile fashion, and a sterile drape was applied covering the operative field. Maximum barrier sterile technique with sterile gowns and gloves were used for the procedure. A timeout was performed prior to the initiation of the procedure. Ultrasound survey was performed. The right internal jugular vein was confirmed to be patent, with images stored and sent to PACS. Micropuncture kit was utilized to access the right internal jugular vein under direct, real-time  ultrasound guidance after the overlying soft tissues were anesthetized  with 1% lidocaine with epinephrine. Stab incision was made with 11 blade scalpel. Microwire was passed centrally. The microwire was then marked to measure appropriate internal catheter length. External tunneled length was estimated. A total tip to cuff length of 23 cm was selected. 035 guidewire was advanced to the level of the IVC. Skin and subcutaneous tissues of chest wall below the clavicle were generously infiltrated with 1% lidocaine for local anesthesia. A small stab incision was made with 11 blade scalpel. The selected hemodialysis catheter was tunneled in a retrograde fashion from the anterior chest wall to the venotomy incision. Serial dilation was performed and then a peel-away sheath was placed. The catheter was then placed through the peel-away sheath with tips ultimately positioned within the superior aspect of the right atrium. Final catheter positioning was confirmed and documented with a spot radiographic image. The catheter aspirates and flushes normally. The catheter was flushed with appropriate volume heparin dwells. The catheter exit site was secured with a 0-Prolene retention suture. Gel-Foam slurry was infused into the soft tissue tract. The venotomy incision was closed Derma bond and sterile dressing. Dressings were applied at the chest wall. Patient tolerated the procedure well and remained hemodynamically stable throughout. No complications were encountered and no significant blood loss encountered. IMPRESSION: Status post image guided right IJ tunneled hemodialysis catheter. Signed, Yvone Neu. Miachel Roux, RPVI [facility] Radiology Electronically Signed   By: Gilmer Mor D.O.   On: 02/12/2024 09:16   DG CHEST PORT 1 VIEW Result Date: 02/12/2024 CLINICAL DATA:  Respiratory distress EXAM: PORTABLE CHEST 1 VIEW COMPARISON:  Partial comparison to CT abdomen/pelvis dated 02/10/2024. Chest radiograph dated 12/31/2023. FINDINGS: Multifocal  right lung opacities, raising concern for mild multifocal pneumonia. Superimposed right lower lobe atelectasis with layering small to moderate right pleural effusion. Mild patchy/retrocardiac opacity, favoring atelectasis when correlating with prior CT. No pneumothorax. The heart is normal in size.  Thoracic aortic atherosclerosis. IMPRESSION: Multifocal right lung opacities, raising concern for mild multifocal pneumonia. Superimposed bilateral lower lobe atelectasis. Layering small to moderate right pleural effusion. Electronically Signed   By: Charline Bills M.D.   On: 02/12/2024 03:19   CT Renal Stone Study Result Date: 02/10/2024 CLINICAL DATA:  Flank pain, abnormal labs EXAM: CT ABDOMEN AND PELVIS WITHOUT CONTRAST TECHNIQUE: Multidetector CT imaging of the abdomen and pelvis was performed following the standard protocol without IV contrast. RADIATION DOSE REDUCTION: This exam was performed according to the departmental dose-optimization program which includes automated exposure control, adjustment of the mA and/or kV according to patient size and/or use of iterative reconstruction technique. COMPARISON:  01/26/2017 FINDINGS: Lower chest: Small bilateral pleural effusions. Associated compressive atelectasis in the bilateral lower lobes. Hepatobiliary: Scattered hepatic cysts measuring up to 2.9 cm in the posterior right hepatic lobe (series 2/image 14). Gallbladder is notable for tiny layering gallstones (series 2/image 22), without associated inflammatory changes. No intrahepatic or extrahepatic ductal dilatation. Pancreas: Within normal limits. Spleen: Within normal limits. Adrenals/Urinary Tract: Low-density 1.7 cm renal nodule, measuring less than 20 HUs, indeterminate but likely reflecting a benign adrenal adenoma. Right adrenal gland is within normal limits. Numerous simple bilateral renal cysts, measuring up to 4.2 cm in the lateral left upper kidney (series 2/image 28), benign (Bosniak I). No  follow-up is recommended. 15 mm hyperdense lesion along the anterior right upper kidney, measuring chest under 70 HUs, technically indeterminate but unchanged from 2018, compatible with a  benign hemorrhagic cyst (Bosniak II). No follow-up is recommended. No hydronephrosis. Bladder is within normal limits. Stomach/Bowel: Stomach is within normal limits. No evidence of bowel obstruction. Normal appendix (series 2/image 40). Mild left colonic diverticulosis, without evidence of diverticulitis. Vascular/Lymphatic: Aorta bi-iliac stents. Atherosclerotic calcifications of the abdominal aorta and branch vessels. Excluded 1.8 cm right common iliac artery aneurysm (series 2/image 56). No suspicious abdominopelvic lymphadenopathy. Reproductive: Mild prostatomegaly with dystrophic calcifications. Other: No abdominopelvic ascites. Musculoskeletal: Status post PLIF at L3-S1. Anterior lumbar fixation at L5-S1. IMPRESSION: Cholelithiasis, without associated inflammatory changes. Mild left colonic diverticulosis, without evidence of diverticulitis. Small bilateral pleural effusions. Additional ancillary findings as above. Aortic Atherosclerosis (ICD10-I70.0). Electronically Signed   By: Charline Bills M.D.   On: 02/10/2024 23:44    Time coordinating discharge: 45 mins  SIGNED:  Carollee Herter, DO [facility] 02/16/24, 1:03 PM

## 2024-02-16 NOTE — Discharge Planning (Signed)
 [facility]  Initial Hemodialysis Orders  Dialysis center: [place]  Patient's name: Joseph Hebert. DOB: 01/16/1946 AKI or ESRD: ESRD  Discharge diagnosis: AKI on CKD IV now ESRD on HD 2.  AHRF, COPD,pleural effusion, concern for PNA- s/p rt thora 2/27   Allergies:  Allergies  Allergen Reactions   Lopressor [Metoprolol Tartrate] Other (See Comments)    Severe bradycardia    Date of First Dialysis: 02/12/24 Cause of renal disease: unclear, likely listed in [employer], recently followed by Dr. Arrie Aran  PMH: HTN, DM type 2, PAD s/p stents, left femoral popliteal bypass graft, carotid artery occlusion s/p CEA, COPD, HLD, DDD s/p L3-L5 fusion, h/o PEA arrest, gout, GERD, diet controlled DMT2, HTN  Dialysis Prescription: Dialysis Frequency: TTS Tx duration: 3.5 -> 4hrs BFR: 350 -> 400 DFR: 600 EDW: 77kg  Dialyzer: 180NRe UF profile/Sodium modeling?: no Dialysis Bath: 2 K 2 Ca  Dialysis access: Access type:  Left brachial artery to cephalic vein AVF  Date placed: 02/14/24 Surgeon: Dr. Randie Heinz Needle gauge: none TDC placed by IR on 02/12/24  In Center Medications: Heparin Dose: none  Type: porcine VDRA: per protocol Venofer: 100mg  qHD x8 Mircera: per protocol  Sensipar: per protocol  Discharge labs: Hgb: 8.4 K+: 4 Ca: 9.3   Phos: 5.9 Alb: 3.0  Please draw routine labs. Additional labs needed: Monthly labs 1st HD Additional notes/follow-up:

## 2024-02-17 ENCOUNTER — Encounter (HOSPITAL_COMMUNITY): Payer: Self-pay | Admitting: Vascular Surgery

## 2024-02-17 DIAGNOSIS — J9601 Acute respiratory failure with hypoxia: Secondary | ICD-10-CM | POA: Diagnosis not present

## 2024-02-17 LAB — CULTURE, BLOOD (ROUTINE X 2)
Culture: NO GROWTH
Culture: NO GROWTH
Special Requests: ADEQUATE

## 2024-02-17 NOTE — Progress Notes (Signed)
 Late Note Entry- February 17, 2024  Pt was d/c on Sunday. Contacted [facility] to advise staff of pt's d/c date and that pt should start tomorrow as planned.   Olivia Canter Renal Navigator [phone]

## 2024-02-18 ENCOUNTER — Telehealth: Payer: Self-pay | Admitting: Nephrology

## 2024-02-18 DIAGNOSIS — N186 End stage renal disease: Secondary | ICD-10-CM | POA: Diagnosis not present

## 2024-02-18 DIAGNOSIS — N2581 Secondary hyperparathyroidism of renal origin: Secondary | ICD-10-CM | POA: Diagnosis not present

## 2024-02-18 DIAGNOSIS — Z992 Dependence on renal dialysis: Secondary | ICD-10-CM | POA: Diagnosis not present

## 2024-02-18 LAB — CYTOLOGY - NON PAP

## 2024-02-18 NOTE — Telephone Encounter (Signed)
 Transition of Care - Initial Contact from Inpatient Facility  Date of discharge: 02/16/24 Date of contact: 02/18/24 Method: Phone Spoke to: Patient's wife   Patient contacted to discuss transition of care from recent inpatient hospitalization. Patient was admitted to [facility] from 2/24-02/16/24 with discharge diagnosis of acute hypoxic respiratory failure, progressive CKD requiring dialysis.   The discharge medication list was reviewed.  Patient will return to his/her outpatient HD unit on: Thursday 3/6  No other concerns at this time.

## 2024-02-20 DIAGNOSIS — Z992 Dependence on renal dialysis: Secondary | ICD-10-CM | POA: Diagnosis not present

## 2024-02-20 DIAGNOSIS — N2581 Secondary hyperparathyroidism of renal origin: Secondary | ICD-10-CM | POA: Diagnosis not present

## 2024-02-20 DIAGNOSIS — N186 End stage renal disease: Secondary | ICD-10-CM | POA: Diagnosis not present

## 2024-02-22 DIAGNOSIS — Z992 Dependence on renal dialysis: Secondary | ICD-10-CM | POA: Diagnosis not present

## 2024-02-22 DIAGNOSIS — N186 End stage renal disease: Secondary | ICD-10-CM | POA: Diagnosis not present

## 2024-02-22 DIAGNOSIS — N2581 Secondary hyperparathyroidism of renal origin: Secondary | ICD-10-CM | POA: Diagnosis not present

## 2024-02-25 DIAGNOSIS — N186 End stage renal disease: Secondary | ICD-10-CM | POA: Diagnosis not present

## 2024-02-25 DIAGNOSIS — N2581 Secondary hyperparathyroidism of renal origin: Secondary | ICD-10-CM | POA: Diagnosis not present

## 2024-02-25 DIAGNOSIS — Z992 Dependence on renal dialysis: Secondary | ICD-10-CM | POA: Diagnosis not present

## 2024-02-26 DIAGNOSIS — E875 Hyperkalemia: Secondary | ICD-10-CM | POA: Diagnosis not present

## 2024-02-26 DIAGNOSIS — I120 Hypertensive chronic kidney disease with stage 5 chronic kidney disease or end stage renal disease: Secondary | ICD-10-CM | POA: Diagnosis not present

## 2024-02-26 DIAGNOSIS — J9601 Acute respiratory failure with hypoxia: Secondary | ICD-10-CM | POA: Diagnosis not present

## 2024-02-26 DIAGNOSIS — N186 End stage renal disease: Secondary | ICD-10-CM | POA: Diagnosis not present

## 2024-02-26 DIAGNOSIS — E039 Hypothyroidism, unspecified: Secondary | ICD-10-CM | POA: Diagnosis not present

## 2024-02-26 DIAGNOSIS — J90 Pleural effusion, not elsewhere classified: Secondary | ICD-10-CM | POA: Diagnosis not present

## 2024-02-26 DIAGNOSIS — Z992 Dependence on renal dialysis: Secondary | ICD-10-CM | POA: Diagnosis not present

## 2024-02-26 DIAGNOSIS — D631 Anemia in chronic kidney disease: Secondary | ICD-10-CM | POA: Diagnosis not present

## 2024-02-27 DIAGNOSIS — N186 End stage renal disease: Secondary | ICD-10-CM | POA: Diagnosis not present

## 2024-02-27 DIAGNOSIS — N2581 Secondary hyperparathyroidism of renal origin: Secondary | ICD-10-CM | POA: Diagnosis not present

## 2024-02-27 DIAGNOSIS — Z992 Dependence on renal dialysis: Secondary | ICD-10-CM | POA: Diagnosis not present

## 2024-02-29 DIAGNOSIS — Z992 Dependence on renal dialysis: Secondary | ICD-10-CM | POA: Diagnosis not present

## 2024-02-29 DIAGNOSIS — N2581 Secondary hyperparathyroidism of renal origin: Secondary | ICD-10-CM | POA: Diagnosis not present

## 2024-02-29 DIAGNOSIS — N186 End stage renal disease: Secondary | ICD-10-CM | POA: Diagnosis not present

## 2024-03-03 DIAGNOSIS — Z992 Dependence on renal dialysis: Secondary | ICD-10-CM | POA: Diagnosis not present

## 2024-03-03 DIAGNOSIS — N2581 Secondary hyperparathyroidism of renal origin: Secondary | ICD-10-CM | POA: Diagnosis not present

## 2024-03-03 DIAGNOSIS — N186 End stage renal disease: Secondary | ICD-10-CM | POA: Diagnosis not present

## 2024-03-05 DIAGNOSIS — Z992 Dependence on renal dialysis: Secondary | ICD-10-CM | POA: Diagnosis not present

## 2024-03-05 DIAGNOSIS — N186 End stage renal disease: Secondary | ICD-10-CM | POA: Diagnosis not present

## 2024-03-05 DIAGNOSIS — N2581 Secondary hyperparathyroidism of renal origin: Secondary | ICD-10-CM | POA: Diagnosis not present

## 2024-03-07 DIAGNOSIS — Z992 Dependence on renal dialysis: Secondary | ICD-10-CM | POA: Diagnosis not present

## 2024-03-07 DIAGNOSIS — N2581 Secondary hyperparathyroidism of renal origin: Secondary | ICD-10-CM | POA: Diagnosis not present

## 2024-03-07 DIAGNOSIS — N186 End stage renal disease: Secondary | ICD-10-CM | POA: Diagnosis not present

## 2024-03-10 DIAGNOSIS — N2581 Secondary hyperparathyroidism of renal origin: Secondary | ICD-10-CM | POA: Diagnosis not present

## 2024-03-10 DIAGNOSIS — N186 End stage renal disease: Secondary | ICD-10-CM | POA: Diagnosis not present

## 2024-03-10 DIAGNOSIS — Z992 Dependence on renal dialysis: Secondary | ICD-10-CM | POA: Diagnosis not present

## 2024-03-12 DIAGNOSIS — Z992 Dependence on renal dialysis: Secondary | ICD-10-CM | POA: Diagnosis not present

## 2024-03-12 DIAGNOSIS — N2581 Secondary hyperparathyroidism of renal origin: Secondary | ICD-10-CM | POA: Diagnosis not present

## 2024-03-12 DIAGNOSIS — N186 End stage renal disease: Secondary | ICD-10-CM | POA: Diagnosis not present

## 2024-03-13 DIAGNOSIS — Z992 Dependence on renal dialysis: Secondary | ICD-10-CM | POA: Diagnosis not present

## 2024-03-13 DIAGNOSIS — N2581 Secondary hyperparathyroidism of renal origin: Secondary | ICD-10-CM | POA: Diagnosis not present

## 2024-03-13 DIAGNOSIS — N186 End stage renal disease: Secondary | ICD-10-CM | POA: Diagnosis not present

## 2024-03-14 DIAGNOSIS — N186 End stage renal disease: Secondary | ICD-10-CM | POA: Diagnosis not present

## 2024-03-14 DIAGNOSIS — Z992 Dependence on renal dialysis: Secondary | ICD-10-CM | POA: Diagnosis not present

## 2024-03-14 DIAGNOSIS — N2581 Secondary hyperparathyroidism of renal origin: Secondary | ICD-10-CM | POA: Diagnosis not present

## 2024-03-16 DIAGNOSIS — Z992 Dependence on renal dialysis: Secondary | ICD-10-CM | POA: Diagnosis not present

## 2024-03-16 DIAGNOSIS — N186 End stage renal disease: Secondary | ICD-10-CM | POA: Diagnosis not present

## 2024-03-16 DIAGNOSIS — E1122 Type 2 diabetes mellitus with diabetic chronic kidney disease: Secondary | ICD-10-CM | POA: Diagnosis not present

## 2024-03-17 DIAGNOSIS — Z992 Dependence on renal dialysis: Secondary | ICD-10-CM | POA: Diagnosis not present

## 2024-03-17 DIAGNOSIS — N186 End stage renal disease: Secondary | ICD-10-CM | POA: Diagnosis not present

## 2024-03-17 DIAGNOSIS — N2581 Secondary hyperparathyroidism of renal origin: Secondary | ICD-10-CM | POA: Diagnosis not present

## 2024-03-18 DIAGNOSIS — J189 Pneumonia, unspecified organism: Secondary | ICD-10-CM | POA: Diagnosis not present

## 2024-03-19 DIAGNOSIS — Z992 Dependence on renal dialysis: Secondary | ICD-10-CM | POA: Diagnosis not present

## 2024-03-19 DIAGNOSIS — N2581 Secondary hyperparathyroidism of renal origin: Secondary | ICD-10-CM | POA: Diagnosis not present

## 2024-03-19 DIAGNOSIS — N186 End stage renal disease: Secondary | ICD-10-CM | POA: Diagnosis not present

## 2024-03-19 DIAGNOSIS — J9601 Acute respiratory failure with hypoxia: Secondary | ICD-10-CM | POA: Diagnosis not present

## 2024-03-21 DIAGNOSIS — N2581 Secondary hyperparathyroidism of renal origin: Secondary | ICD-10-CM | POA: Diagnosis not present

## 2024-03-21 DIAGNOSIS — N186 End stage renal disease: Secondary | ICD-10-CM | POA: Diagnosis not present

## 2024-03-21 DIAGNOSIS — Z992 Dependence on renal dialysis: Secondary | ICD-10-CM | POA: Diagnosis not present

## 2024-03-23 ENCOUNTER — Other Ambulatory Visit: Payer: Self-pay

## 2024-03-23 DIAGNOSIS — N186 End stage renal disease: Secondary | ICD-10-CM

## 2024-03-24 DIAGNOSIS — N186 End stage renal disease: Secondary | ICD-10-CM | POA: Diagnosis not present

## 2024-03-24 DIAGNOSIS — Z992 Dependence on renal dialysis: Secondary | ICD-10-CM | POA: Diagnosis not present

## 2024-03-24 DIAGNOSIS — N2581 Secondary hyperparathyroidism of renal origin: Secondary | ICD-10-CM | POA: Diagnosis not present

## 2024-03-26 DIAGNOSIS — N2581 Secondary hyperparathyroidism of renal origin: Secondary | ICD-10-CM | POA: Diagnosis not present

## 2024-03-26 DIAGNOSIS — N186 End stage renal disease: Secondary | ICD-10-CM | POA: Diagnosis not present

## 2024-03-26 DIAGNOSIS — Z992 Dependence on renal dialysis: Secondary | ICD-10-CM | POA: Diagnosis not present

## 2024-03-28 DIAGNOSIS — Z992 Dependence on renal dialysis: Secondary | ICD-10-CM | POA: Diagnosis not present

## 2024-03-28 DIAGNOSIS — N186 End stage renal disease: Secondary | ICD-10-CM | POA: Diagnosis not present

## 2024-03-28 DIAGNOSIS — N2581 Secondary hyperparathyroidism of renal origin: Secondary | ICD-10-CM | POA: Diagnosis not present

## 2024-03-31 DIAGNOSIS — N2581 Secondary hyperparathyroidism of renal origin: Secondary | ICD-10-CM | POA: Diagnosis not present

## 2024-03-31 DIAGNOSIS — Z992 Dependence on renal dialysis: Secondary | ICD-10-CM | POA: Diagnosis not present

## 2024-03-31 DIAGNOSIS — N186 End stage renal disease: Secondary | ICD-10-CM | POA: Diagnosis not present

## 2024-04-01 ENCOUNTER — Ambulatory Visit (HOSPITAL_COMMUNITY)
Admission: RE | Admit: 2024-04-01 | Discharge: 2024-04-01 | Disposition: A | Discharge status: Home / Self Care | Payer: Medicare PPO | Source: Ambulatory Visit | Attending: Vascular Surgery | Admitting: Vascular Surgery

## 2024-04-01 ENCOUNTER — Ambulatory Visit (INDEPENDENT_AMBULATORY_CARE_PROVIDER_SITE_OTHER): Payer: Medicare PPO | Admitting: Physician Assistant

## 2024-04-01 VITALS — BP 93/58 | HR 91 | Temp 98.4°F | Ht 70.0 in | Wt 170.0 lb

## 2024-04-01 DIAGNOSIS — Z992 Dependence on renal dialysis: Secondary | ICD-10-CM | POA: Diagnosis not present

## 2024-04-01 DIAGNOSIS — N186 End stage renal disease: Secondary | ICD-10-CM | POA: Insufficient documentation

## 2024-04-01 NOTE — Progress Notes (Signed)
 POST OPERATIVE OFFICE NOTE    CC:  F/u for surgery  HPI:  Joseph Hebert is a 78 y.o. male who is here for postop visit.  He recently underwent creation of a left brachiocephalic AV fistula on 02/14/2024 by Dr. Randie Heinz.  This was done for permanent dialysis access.  We also followed the patient for peripheral arterial disease with history of left lower extremity bypass.  Pt returns today for follow up.  He has no complaints at today's visit.  He believes that his left arm incision is completely healed.  He denies any symptoms of steal in the left hand such as weakness, numbness, pain, or excessive coldness.  He currently dialyzes via right IJ TDC.   Allergies  Allergen Reactions   Lopressor [Metoprolol Tartrate] Other (See Comments)    Severe bradycardia    Current Outpatient Medications  Medication Sig Dispense Refill   amLODipine (NORVASC) 10 MG tablet Take 10 mg by mouth at bedtime. (2100)     aspirin EC 81 MG tablet Take 81 mg by mouth daily. Swallow whole.     atorvastatin (LIPITOR) 80 MG tablet Take 80 mg by mouth at bedtime. (2100)     Febuxostat 80 MG TABS Take 1 tablet by mouth daily.     gabapentin (NEURONTIN) 100 MG capsule Take 1 capsule by mouth at bedtime.     hydrALAZINE (APRESOLINE) 25 MG tablet Take 1 tablet (25 mg total) by mouth 2 (two) times daily. 60 tablet 0   hydrALAZINE (APRESOLINE) 25 MG tablet Take 1 tablet (25 mg total) by mouth 2 (two) times daily. 180 tablet 0   ipratropium-albuterol (DUONEB) 0.5-2.5 (3) MG/3ML SOLN Take 3 mLs by nebulization every 6 (six) hours as needed. 360 mL 0   levothyroxine (SYNTHROID) 75 MCG tablet Take 75 mcg by mouth daily.     memantine (NAMENDA) 10 MG tablet Take 1 tablet by mouth 2 (two) times daily.     sevelamer carbonate (RENVELA) 800 MG tablet Take 1 tablet (800 mg total) by mouth 3 (three) times daily with meals. 270 tablet 0   No current facility-administered medications for this visit.     ROS:  See HPI  Physical  Exam:  Incision:  left arm incision well healed without hematoma, dehiscence, or drainage Extremities: 1+ left radial pulse.  Left brachiocephalic fistula with great thrill on palpation, large branch in the distal upper arm with strong thrill Neuro: intact motor and sensation of left hand  Studies: Dialysis Duplex: (04/01/2024)  +--------------------+----------+-----------------+--------+  AVF                PSV (cm/s)Flow Vol (mL/min)Comments  +--------------------+----------+-----------------+--------+  Native artery inflow   190           715                 +--------------------+----------+-----------------+--------+  AVF Anastomosis        241                               +--------------------+----------+-----------------+--------+     +------------+----------+-------------+----------+-------------------------  ----+  OUTFLOW VEINPSV (cm/s)Diameter (cm)Depth (cm)          Describe              +------------+----------+-------------+----------+-------------------------  ----+  Prox UA        101        0.49        0.66                                   +------------+----------+-------------+----------+-------------------------  ----+  Mid UA          72        0.52        0.56                                   +------------+----------+-------------+----------+-------------------------  ----+  Dist UA        107        0.55        0.38    change in Diameter and  0.28                                                          cm branch             +------------+----------+-------------+----------+-------------------------  ----+  AC Fossa       215        0.36        0.54                                   +------------+----------+-------------+----------+-------------------------    Assessment/Plan:  This is a 78 y.o. male who is here for a post op visit  - The patient's left arm incision is well-healed without signs of  infection - His left upper extremity is well-perfused with a 1+ radial pulse.  He denies any symptoms of steal in the left hand - Duplex demonstrates a slow to mature left brachiocephalic fistula with good flow volumes of 715 ml/min.  Diameters of the fistula are less than 6 mm.  There is also a large competing branch in the distal upper arm - On exam his fistula has a great thrill.  There is also a strong thrill through the competing branch in his distal upper arm - I have encouraged the patient to exercise his hand and arm to help his fistula get larger in size.  Ultimately there is a chance that he will need fistulogram if his fistula still slow to mature at his next follow-up.  At this time we will plan for follow-up in 5 to 6 weeks with repeat dialysis duplex.  He will continue to use his right IJ [facility] for dialysis at this time   Deneise Finlay, PA-C [facility] [phone]   Call MD: Rosalva Comber

## 2024-04-02 DIAGNOSIS — N186 End stage renal disease: Secondary | ICD-10-CM | POA: Diagnosis not present

## 2024-04-02 DIAGNOSIS — N2581 Secondary hyperparathyroidism of renal origin: Secondary | ICD-10-CM | POA: Diagnosis not present

## 2024-04-02 DIAGNOSIS — Z992 Dependence on renal dialysis: Secondary | ICD-10-CM | POA: Diagnosis not present

## 2024-04-04 DIAGNOSIS — N186 End stage renal disease: Secondary | ICD-10-CM | POA: Diagnosis not present

## 2024-04-04 DIAGNOSIS — Z992 Dependence on renal dialysis: Secondary | ICD-10-CM | POA: Diagnosis not present

## 2024-04-04 DIAGNOSIS — N2581 Secondary hyperparathyroidism of renal origin: Secondary | ICD-10-CM | POA: Diagnosis not present

## 2024-04-07 DIAGNOSIS — N186 End stage renal disease: Secondary | ICD-10-CM | POA: Diagnosis not present

## 2024-04-07 DIAGNOSIS — N2581 Secondary hyperparathyroidism of renal origin: Secondary | ICD-10-CM | POA: Diagnosis not present

## 2024-04-07 DIAGNOSIS — Z992 Dependence on renal dialysis: Secondary | ICD-10-CM | POA: Diagnosis not present

## 2024-04-08 ENCOUNTER — Encounter (HOSPITAL_COMMUNITY): Payer: Medicare PPO

## 2024-04-08 ENCOUNTER — Ambulatory Visit: Payer: Medicare PPO

## 2024-04-08 ENCOUNTER — Other Ambulatory Visit (HOSPITAL_COMMUNITY): Payer: Medicare PPO

## 2024-04-09 DIAGNOSIS — N186 End stage renal disease: Secondary | ICD-10-CM | POA: Diagnosis not present

## 2024-04-09 DIAGNOSIS — Z992 Dependence on renal dialysis: Secondary | ICD-10-CM | POA: Diagnosis not present

## 2024-04-09 DIAGNOSIS — N2581 Secondary hyperparathyroidism of renal origin: Secondary | ICD-10-CM | POA: Diagnosis not present

## 2024-04-11 DIAGNOSIS — N2581 Secondary hyperparathyroidism of renal origin: Secondary | ICD-10-CM | POA: Diagnosis not present

## 2024-04-11 DIAGNOSIS — N186 End stage renal disease: Secondary | ICD-10-CM | POA: Diagnosis not present

## 2024-04-11 DIAGNOSIS — Z992 Dependence on renal dialysis: Secondary | ICD-10-CM | POA: Diagnosis not present

## 2024-04-14 DIAGNOSIS — N186 End stage renal disease: Secondary | ICD-10-CM | POA: Diagnosis not present

## 2024-04-14 DIAGNOSIS — N2581 Secondary hyperparathyroidism of renal origin: Secondary | ICD-10-CM | POA: Diagnosis not present

## 2024-04-14 DIAGNOSIS — Z992 Dependence on renal dialysis: Secondary | ICD-10-CM | POA: Diagnosis not present

## 2024-04-15 DIAGNOSIS — Z992 Dependence on renal dialysis: Secondary | ICD-10-CM | POA: Diagnosis not present

## 2024-04-15 DIAGNOSIS — E1122 Type 2 diabetes mellitus with diabetic chronic kidney disease: Secondary | ICD-10-CM | POA: Diagnosis not present

## 2024-04-15 DIAGNOSIS — N186 End stage renal disease: Secondary | ICD-10-CM | POA: Diagnosis not present

## 2024-04-16 ENCOUNTER — Other Ambulatory Visit: Payer: Self-pay | Admitting: *Deleted

## 2024-04-16 DIAGNOSIS — N2581 Secondary hyperparathyroidism of renal origin: Secondary | ICD-10-CM | POA: Diagnosis not present

## 2024-04-16 DIAGNOSIS — Z992 Dependence on renal dialysis: Secondary | ICD-10-CM

## 2024-04-16 DIAGNOSIS — N186 End stage renal disease: Secondary | ICD-10-CM | POA: Diagnosis not present

## 2024-04-17 DIAGNOSIS — J189 Pneumonia, unspecified organism: Secondary | ICD-10-CM | POA: Diagnosis not present

## 2024-04-18 DIAGNOSIS — Z992 Dependence on renal dialysis: Secondary | ICD-10-CM | POA: Diagnosis not present

## 2024-04-18 DIAGNOSIS — N2581 Secondary hyperparathyroidism of renal origin: Secondary | ICD-10-CM | POA: Diagnosis not present

## 2024-04-18 DIAGNOSIS — N186 End stage renal disease: Secondary | ICD-10-CM | POA: Diagnosis not present

## 2024-04-18 DIAGNOSIS — J9601 Acute respiratory failure with hypoxia: Secondary | ICD-10-CM | POA: Diagnosis not present

## 2024-04-21 DIAGNOSIS — N2581 Secondary hyperparathyroidism of renal origin: Secondary | ICD-10-CM | POA: Diagnosis not present

## 2024-04-21 DIAGNOSIS — N186 End stage renal disease: Secondary | ICD-10-CM | POA: Diagnosis not present

## 2024-04-21 DIAGNOSIS — Z992 Dependence on renal dialysis: Secondary | ICD-10-CM | POA: Diagnosis not present

## 2024-04-23 DIAGNOSIS — N2581 Secondary hyperparathyroidism of renal origin: Secondary | ICD-10-CM | POA: Diagnosis not present

## 2024-04-23 DIAGNOSIS — Z992 Dependence on renal dialysis: Secondary | ICD-10-CM | POA: Diagnosis not present

## 2024-04-23 DIAGNOSIS — N186 End stage renal disease: Secondary | ICD-10-CM | POA: Diagnosis not present

## 2024-04-25 DIAGNOSIS — Z992 Dependence on renal dialysis: Secondary | ICD-10-CM | POA: Diagnosis not present

## 2024-04-25 DIAGNOSIS — N2581 Secondary hyperparathyroidism of renal origin: Secondary | ICD-10-CM | POA: Diagnosis not present

## 2024-04-25 DIAGNOSIS — N186 End stage renal disease: Secondary | ICD-10-CM | POA: Diagnosis not present

## 2024-04-27 DIAGNOSIS — N186 End stage renal disease: Secondary | ICD-10-CM | POA: Diagnosis not present

## 2024-04-27 DIAGNOSIS — N2581 Secondary hyperparathyroidism of renal origin: Secondary | ICD-10-CM | POA: Diagnosis not present

## 2024-04-27 DIAGNOSIS — Z992 Dependence on renal dialysis: Secondary | ICD-10-CM | POA: Diagnosis not present

## 2024-04-28 DIAGNOSIS — N186 End stage renal disease: Secondary | ICD-10-CM | POA: Diagnosis not present

## 2024-04-28 DIAGNOSIS — N2581 Secondary hyperparathyroidism of renal origin: Secondary | ICD-10-CM | POA: Diagnosis not present

## 2024-04-28 DIAGNOSIS — Z992 Dependence on renal dialysis: Secondary | ICD-10-CM | POA: Diagnosis not present

## 2024-04-30 DIAGNOSIS — Z992 Dependence on renal dialysis: Secondary | ICD-10-CM | POA: Diagnosis not present

## 2024-04-30 DIAGNOSIS — N2581 Secondary hyperparathyroidism of renal origin: Secondary | ICD-10-CM | POA: Diagnosis not present

## 2024-04-30 DIAGNOSIS — N186 End stage renal disease: Secondary | ICD-10-CM | POA: Diagnosis not present

## 2024-05-02 DIAGNOSIS — Z992 Dependence on renal dialysis: Secondary | ICD-10-CM | POA: Diagnosis not present

## 2024-05-02 DIAGNOSIS — N2581 Secondary hyperparathyroidism of renal origin: Secondary | ICD-10-CM | POA: Diagnosis not present

## 2024-05-02 DIAGNOSIS — N186 End stage renal disease: Secondary | ICD-10-CM | POA: Diagnosis not present

## 2024-05-05 DIAGNOSIS — Z992 Dependence on renal dialysis: Secondary | ICD-10-CM | POA: Diagnosis not present

## 2024-05-05 DIAGNOSIS — N186 End stage renal disease: Secondary | ICD-10-CM | POA: Diagnosis not present

## 2024-05-05 DIAGNOSIS — N2581 Secondary hyperparathyroidism of renal origin: Secondary | ICD-10-CM | POA: Diagnosis not present

## 2024-05-06 ENCOUNTER — Encounter: Payer: Self-pay | Admitting: Physician Assistant

## 2024-05-06 ENCOUNTER — Ambulatory Visit (HOSPITAL_COMMUNITY)
Admission: RE | Admit: 2024-05-06 | Discharge: 2024-05-06 | Disposition: A | Discharge status: Home / Self Care | Source: Ambulatory Visit | Attending: Vascular Surgery | Admitting: Vascular Surgery

## 2024-05-06 ENCOUNTER — Ambulatory Visit: Attending: Vascular Surgery | Admitting: Physician Assistant

## 2024-05-06 VITALS — BP 90/58 | HR 50 | Temp 98.0°F | Wt 176.0 lb

## 2024-05-06 DIAGNOSIS — Z992 Dependence on renal dialysis: Secondary | ICD-10-CM

## 2024-05-06 DIAGNOSIS — N186 End stage renal disease: Secondary | ICD-10-CM | POA: Diagnosis not present

## 2024-05-06 NOTE — Progress Notes (Signed)
 POST OPERATIVE OFFICE NOTE    CC:  F/u for surgery  HPI:  This is a 78 y.o. male who is s/p left brachiocephalic AV fistula on 02/14/2024 by Dr. Vikki Graves. The fistula has been slow to mature.  He is here today for repeat duplex to check for flow rates and maturity of the fistula.   Pt returns today for follow up.  Pt states he has no symptoms of steal.  His TDC is working well.   Allergies  Allergen Reactions   Lopressor  [Metoprolol  Tartrate] Other (See Comments)    Severe bradycardia    Current Outpatient Medications  Medication Sig Dispense Refill   amLODipine  (NORVASC ) 10 MG tablet Take 10 mg by mouth at bedtime. (2100) (Patient not taking: Reported on 04/01/2024)     aspirin  EC 81 MG tablet Take 81 mg by mouth daily. Swallow whole.     atorvastatin  (LIPITOR ) 80 MG tablet Take 80 mg by mouth at bedtime. (2100)     Febuxostat  80 MG TABS Take 1 tablet by mouth daily.     gabapentin  (NEURONTIN ) 100 MG capsule Take 1 capsule by mouth at bedtime.     hydrALAZINE  (APRESOLINE ) 25 MG tablet Take 1 tablet (25 mg total) by mouth 2 (two) times daily. 60 tablet 0   hydrALAZINE  (APRESOLINE ) 25 MG tablet Take 1 tablet (25 mg total) by mouth 2 (two) times daily. 180 tablet 0   ipratropium-albuterol  (DUONEB) 0.5-2.5 (3) MG/3ML SOLN Take 3 mLs by nebulization every 6 (six) hours as needed. (Patient not taking: Reported on 04/01/2024) 360 mL 0   levothyroxine  (SYNTHROID ) 75 MCG tablet Take 75 mcg by mouth daily.     memantine  (NAMENDA ) 10 MG tablet Take 1 tablet by mouth 2 (two) times daily.     sevelamer  carbonate (RENVELA ) 800 MG tablet Take 1 tablet (800 mg total) by mouth 3 (three) times daily with meals. 270 tablet 0   No current facility-administered medications for this visit.     ROS:  See HPI  Physical Exam:  Findings:  +--------------------+----------+-----------------+--------+  AVF                PSV (cm/s)Flow Vol (mL/min)Comments   +--------------------+----------+-----------------+--------+  Native artery inflow   190           715                 +--------------------+----------+-----------------+--------+  AVF Anastomosis        241                               +--------------------+----------+-----------------+--------+     +------------+----------+-------------+----------+-------------------------  ----+  OUTFLOW VEINPSV (cm/s)Diameter (cm)Depth (cm)          Describe              +------------+----------+-------------+----------+-------------------------  ----+  Prox UA        101        0.49        0.66                                   +------------+----------+-------------+----------+-------------------------  ----+  Mid UA          72        0.52        0.56                                   +------------+----------+-------------+----------+-------------------------  ----+  Dist UA        107        0.55        0.38    change in Diameter and  0.28                                                          cm branch             +------------+----------+-------------+----------+-------------------------  ----+  AC Fossa       215        0.36        0.54                                   +------------+----------+-------------+----------+-------------------------  ----+     Summary:  Patent left BC AVF.  Flow volume is 715 ml/min.  Outflow vein runs laterally.  No evidence of thrombus or stenosis.  Branch in the distal upper arm 0.28 cm.   He has a palpable thrill and radial pulse.  No weakness or pain in the left UE. Lungs non labored Heart RRR    Assessment/Plan:  This is a 78 y.o. male who is s/p:left brachiocephalic AV fistula on 02/14/2024 by Dr. Vikki Graves. The fistula has a flow rate of 715, bu the diameter is not large enough to stick.  I will schedule him for fistuloogram with possible intervention to improve the flow vol and diameter of the  fistula.  He may have central venous stenosis.  He currently dialyzes via right IJ TDC.  HD TTS.  He is on ASA and no anticoagulation.      Rocky Cipro PA-C [facility] [phone]   Call MD:  Fulton Job

## 2024-05-06 NOTE — H&P (View-Only) (Signed)
 POST OPERATIVE OFFICE NOTE    CC:  F/u for surgery  HPI:  This is a 78 y.o. male who is s/p left brachiocephalic AV fistula on 02/14/2024 by Dr. Vikki Graves. The fistula has been slow to mature.  He is here today for repeat duplex to check for flow rates and maturity of the fistula.   Pt returns today for follow up.  Pt states he has no symptoms of steal.  His TDC is working well.   Allergies  Allergen Reactions   Lopressor  [Metoprolol  Tartrate] Other (See Comments)    Severe bradycardia    Current Outpatient Medications  Medication Sig Dispense Refill   amLODipine  (NORVASC ) 10 MG tablet Take 10 mg by mouth at bedtime. (2100) (Patient not taking: Reported on 04/01/2024)     aspirin  EC 81 MG tablet Take 81 mg by mouth daily. Swallow whole.     atorvastatin  (LIPITOR ) 80 MG tablet Take 80 mg by mouth at bedtime. (2100)     Febuxostat  80 MG TABS Take 1 tablet by mouth daily.     gabapentin  (NEURONTIN ) 100 MG capsule Take 1 capsule by mouth at bedtime.     hydrALAZINE  (APRESOLINE ) 25 MG tablet Take 1 tablet (25 mg total) by mouth 2 (two) times daily. 60 tablet 0   hydrALAZINE  (APRESOLINE ) 25 MG tablet Take 1 tablet (25 mg total) by mouth 2 (two) times daily. 180 tablet 0   ipratropium-albuterol  (DUONEB) 0.5-2.5 (3) MG/3ML SOLN Take 3 mLs by nebulization every 6 (six) hours as needed. (Patient not taking: Reported on 04/01/2024) 360 mL 0   levothyroxine  (SYNTHROID ) 75 MCG tablet Take 75 mcg by mouth daily.     memantine  (NAMENDA ) 10 MG tablet Take 1 tablet by mouth 2 (two) times daily.     sevelamer  carbonate (RENVELA ) 800 MG tablet Take 1 tablet (800 mg total) by mouth 3 (three) times daily with meals. 270 tablet 0   No current facility-administered medications for this visit.     ROS:  See HPI  Physical Exam:  Findings:  +--------------------+----------+-----------------+--------+  AVF                PSV (cm/s)Flow Vol (mL/min)Comments   +--------------------+----------+-----------------+--------+  Native artery inflow   190           715                 +--------------------+----------+-----------------+--------+  AVF Anastomosis        241                               +--------------------+----------+-----------------+--------+     +------------+----------+-------------+----------+-------------------------  ----+  OUTFLOW VEINPSV (cm/s)Diameter (cm)Depth (cm)          Describe              +------------+----------+-------------+----------+-------------------------  ----+  Prox UA        101        0.49        0.66                                   +------------+----------+-------------+----------+-------------------------  ----+  Mid UA          72        0.52        0.56                                   +------------+----------+-------------+----------+-------------------------  ----+  Dist UA        107        0.55        0.38    change in Diameter and  0.28                                                          cm branch             +------------+----------+-------------+----------+-------------------------  ----+  AC Fossa       215        0.36        0.54                                   +------------+----------+-------------+----------+-------------------------  ----+     Summary:  Patent left BC AVF.  Flow volume is 715 ml/min.  Outflow vein runs laterally.  No evidence of thrombus or stenosis.  Branch in the distal upper arm 0.28 cm.   He has a palpable thrill and radial pulse.  No weakness or pain in the left UE. Lungs non labored Heart RRR    Assessment/Plan:  This is a 78 y.o. male who is s/p:left brachiocephalic AV fistula on 02/14/2024 by Dr. Vikki Graves. The fistula has a flow rate of 715, bu the diameter is not large enough to stick.  I will schedule him for fistuloogram with possible intervention to improve the flow vol and diameter of the  fistula.  He may have central venous stenosis.  He currently dialyzes via right IJ TDC.  HD TTS.  He is on ASA and no anticoagulation.      Rocky Cipro PA-C [facility] [phone]   Call MD:  Fulton Job

## 2024-05-07 DIAGNOSIS — N2581 Secondary hyperparathyroidism of renal origin: Secondary | ICD-10-CM | POA: Diagnosis not present

## 2024-05-07 DIAGNOSIS — N186 End stage renal disease: Secondary | ICD-10-CM | POA: Diagnosis not present

## 2024-05-07 DIAGNOSIS — Z992 Dependence on renal dialysis: Secondary | ICD-10-CM | POA: Diagnosis not present

## 2024-05-09 DIAGNOSIS — N2581 Secondary hyperparathyroidism of renal origin: Secondary | ICD-10-CM | POA: Diagnosis not present

## 2024-05-09 DIAGNOSIS — Z992 Dependence on renal dialysis: Secondary | ICD-10-CM | POA: Diagnosis not present

## 2024-05-09 DIAGNOSIS — N186 End stage renal disease: Secondary | ICD-10-CM | POA: Diagnosis not present

## 2024-05-12 DIAGNOSIS — N186 End stage renal disease: Secondary | ICD-10-CM | POA: Diagnosis not present

## 2024-05-12 DIAGNOSIS — Z992 Dependence on renal dialysis: Secondary | ICD-10-CM | POA: Diagnosis not present

## 2024-05-12 DIAGNOSIS — N2581 Secondary hyperparathyroidism of renal origin: Secondary | ICD-10-CM | POA: Diagnosis not present

## 2024-05-14 DIAGNOSIS — Z992 Dependence on renal dialysis: Secondary | ICD-10-CM | POA: Diagnosis not present

## 2024-05-14 DIAGNOSIS — N186 End stage renal disease: Secondary | ICD-10-CM | POA: Diagnosis not present

## 2024-05-14 DIAGNOSIS — N2581 Secondary hyperparathyroidism of renal origin: Secondary | ICD-10-CM | POA: Diagnosis not present

## 2024-05-15 ENCOUNTER — Other Ambulatory Visit: Payer: Self-pay

## 2024-05-15 ENCOUNTER — Ambulatory Visit (HOSPITAL_COMMUNITY)
Admission: RE | Admit: 2024-05-15 | Discharge: 2024-05-15 | Disposition: A | Discharge status: Home / Self Care | Attending: Surgery | Admitting: Surgery

## 2024-05-15 ENCOUNTER — Encounter (HOSPITAL_COMMUNITY)
Admission: RE | Disposition: A | Discharge status: Home / Self Care | Payer: Self-pay | Source: Home / Self Care | Attending: Surgery

## 2024-05-15 DIAGNOSIS — Z992 Dependence on renal dialysis: Secondary | ICD-10-CM | POA: Diagnosis not present

## 2024-05-15 DIAGNOSIS — Y832 Surgical operation with anastomosis, bypass or graft as the cause of abnormal reaction of the patient, or of later complication, without mention of misadventure at the time of the procedure: Secondary | ICD-10-CM | POA: Diagnosis not present

## 2024-05-15 DIAGNOSIS — T82858A Stenosis of vascular prosthetic devices, implants and grafts, initial encounter: Secondary | ICD-10-CM | POA: Diagnosis not present

## 2024-05-15 DIAGNOSIS — I871 Compression of vein: Secondary | ICD-10-CM | POA: Diagnosis not present

## 2024-05-15 DIAGNOSIS — Z7982 Long term (current) use of aspirin: Secondary | ICD-10-CM | POA: Insufficient documentation

## 2024-05-15 DIAGNOSIS — T82898A Other specified complication of vascular prosthetic devices, implants and grafts, initial encounter: Secondary | ICD-10-CM

## 2024-05-15 DIAGNOSIS — N186 End stage renal disease: Secondary | ICD-10-CM

## 2024-05-15 HISTORY — PX: VENOUS ANGIOPLASTY: CATH118376

## 2024-05-15 HISTORY — PX: A/V SHUNT INTERVENTION: CATH118220

## 2024-05-15 LAB — GLUCOSE, CAPILLARY: Glucose-Capillary: 93 mg/dL (ref 70–99)

## 2024-05-15 SURGERY — A/V SHUNT INTERVENTION
Anesthesia: LOCAL

## 2024-05-15 MED ORDER — IODIXANOL 320 MG/ML IV SOLN
INTRAVENOUS | Status: DC | PRN
  Administered 2024-05-15: 30 mL via INTRAVENOUS

## 2024-05-15 MED ORDER — HEPARIN (PORCINE) IN NACL 1000-0.9 UT/500ML-% IV SOLN
INTRAVENOUS | Status: DC | PRN
  Administered 2024-05-15: 500 mL

## 2024-05-15 MED ORDER — LIDOCAINE HCL (PF) 1 % IJ SOLN
INTRAMUSCULAR | Status: DC | PRN
Start: 2024-05-15 — End: 2024-05-15
  Administered 2024-05-15: 5 mL

## 2024-05-15 MED ORDER — LIDOCAINE HCL (PF) 1 % IJ SOLN
INTRAMUSCULAR | Status: AC
  Filled 2024-05-15: qty 30

## 2024-05-15 SURGICAL SUPPLY — 7 items
BALLOON MUSTANG 5.0X40 75 (BALLOONS) IMPLANT
KIT ENCORE 40 (KITS) IMPLANT
KIT MICROPUNCTURE NIT STIFF (SHEATH) IMPLANT
SHEATH PINNACLE R/O II 5F 6CM (SHEATH) IMPLANT
SHEATH PROBE COVER 6X72 (BAG) IMPLANT
TRAY PV CATH (CUSTOM PROCEDURE TRAY) ×2 IMPLANT
WIRE BENTSON .035X145CM (WIRE) IMPLANT

## 2024-05-15 NOTE — Interval H&P Note (Signed)
 History and Physical Interval Note:  05/15/2024 2:14 PM  Joseph Hebert Ramsey Burows.  has presented today for surgery, with the diagnosis of Fistulagram.  The various methods of treatment have been discussed with the patient and family. After consideration of risks, benefits and other options for treatment, the patient has consented to  Procedure(s): A/V SHUNT INTERVENTION (N/A) as a surgical intervention.  The patient's history has been reviewed, patient examined, no change in status, stable for surgery.  I have reviewed the patient's chart and labs.  Questions were answered to the patient's satisfaction.     Gareld June

## 2024-05-15 NOTE — Op Note (Signed)
    Patient name: Joseph Hebert. MRN: [id number] DOB: [birth year] Sex: male  05/15/2024 Pre-operative Diagnosis: Now maturing left brachiocephalic fistula Post-operative diagnosis:  Same Surgeon:  Gareld June Procedure Performed:  1.  Ultrasound-guided access, left cephalic vein  2.  Fistulogram  3.  Venoplasty left cephalic vein (peripheral)    Indications: This is a 78 year old gentleman on with end-stage renal disease who has a had a left brachiocephalic fistula placed that has decreased flow rate although diameters are 5 mm.  He comes in today for fistulogram  Procedure:  The patient was identified in the holding area and taken to room 8.  The patient was then placed supine on the table and prepped and draped in the usual sterile fashion.  A time out was called.  Ultrasound was used to evaluate the fistula.  The vein was patent and compressible.  A digital ultrasound image was acquired.  The fistula was then accessed under ultrasound guidance using a micropuncture needle.  An 018 wire was then asvanced without resistance and a micropuncture sheath was placed.  Contrast injections were then performed through the sheath.  Findings: No evidence of central venous stenosis.  The arteriovenous anastomosis is widely patent.  The fistula in the upper arm is patent measuring about 5 mm.  It does reflux down the cephalic vein towards the wrist which is a significant outflow issue.  In addition, there is a approximately 50 to 60% stenosis within the vein as it turns towards the upper part of the arm in the antecubital crease   Intervention: After the above images were acquired the decision was made to proceed with intervention.  A 5 French sheath was inserted over a Bentson wire.  I selected a 5 x 40 balloon to perform peripheral balloon venoplasty of the cephalic vein with good result of stenosis less than 20%.  The sheath was removed and a Monocryl was placed for closure.  Impression:  #1   Successful venoplasty of a proximal stenosis in the cephalic vein with a 5 mm balloon  #2  Patient will be brought back to the office in 2 weeks for repeat ultrasound.  If there are continued issues with the fistula I would recommend ligation of the cephalic vein as it goes down towards the wrist.  In addition he could be considered for elevation of portions of his fistula   V. Gareld June, M.D., [facility] [place] Office: [phone] Pager:  [phone]

## 2024-05-16 DIAGNOSIS — E1122 Type 2 diabetes mellitus with diabetic chronic kidney disease: Secondary | ICD-10-CM | POA: Diagnosis not present

## 2024-05-16 DIAGNOSIS — N186 End stage renal disease: Secondary | ICD-10-CM | POA: Diagnosis not present

## 2024-05-16 DIAGNOSIS — N2581 Secondary hyperparathyroidism of renal origin: Secondary | ICD-10-CM | POA: Diagnosis not present

## 2024-05-16 DIAGNOSIS — Z992 Dependence on renal dialysis: Secondary | ICD-10-CM | POA: Diagnosis not present

## 2024-05-18 ENCOUNTER — Other Ambulatory Visit: Payer: Self-pay | Admitting: *Deleted

## 2024-05-18 ENCOUNTER — Encounter (HOSPITAL_COMMUNITY): Payer: Self-pay | Admitting: Surgery

## 2024-05-18 DIAGNOSIS — Z992 Dependence on renal dialysis: Secondary | ICD-10-CM

## 2024-05-18 DIAGNOSIS — J189 Pneumonia, unspecified organism: Secondary | ICD-10-CM | POA: Diagnosis not present

## 2024-05-19 DIAGNOSIS — N186 End stage renal disease: Secondary | ICD-10-CM | POA: Diagnosis not present

## 2024-05-19 DIAGNOSIS — Z992 Dependence on renal dialysis: Secondary | ICD-10-CM | POA: Diagnosis not present

## 2024-05-19 DIAGNOSIS — N2581 Secondary hyperparathyroidism of renal origin: Secondary | ICD-10-CM | POA: Diagnosis not present

## 2024-05-21 DIAGNOSIS — N2581 Secondary hyperparathyroidism of renal origin: Secondary | ICD-10-CM | POA: Diagnosis not present

## 2024-05-21 DIAGNOSIS — Z992 Dependence on renal dialysis: Secondary | ICD-10-CM | POA: Diagnosis not present

## 2024-05-21 DIAGNOSIS — N186 End stage renal disease: Secondary | ICD-10-CM | POA: Diagnosis not present

## 2024-05-23 DIAGNOSIS — N2581 Secondary hyperparathyroidism of renal origin: Secondary | ICD-10-CM | POA: Diagnosis not present

## 2024-05-23 DIAGNOSIS — Z992 Dependence on renal dialysis: Secondary | ICD-10-CM | POA: Diagnosis not present

## 2024-05-23 DIAGNOSIS — N186 End stage renal disease: Secondary | ICD-10-CM | POA: Diagnosis not present

## 2024-05-26 DIAGNOSIS — Z992 Dependence on renal dialysis: Secondary | ICD-10-CM | POA: Diagnosis not present

## 2024-05-26 DIAGNOSIS — N186 End stage renal disease: Secondary | ICD-10-CM | POA: Diagnosis not present

## 2024-05-26 DIAGNOSIS — N2581 Secondary hyperparathyroidism of renal origin: Secondary | ICD-10-CM | POA: Diagnosis not present

## 2024-05-27 DIAGNOSIS — N189 Chronic kidney disease, unspecified: Secondary | ICD-10-CM | POA: Diagnosis not present

## 2024-05-27 DIAGNOSIS — D631 Anemia in chronic kidney disease: Secondary | ICD-10-CM | POA: Diagnosis not present

## 2024-05-27 DIAGNOSIS — I120 Hypertensive chronic kidney disease with stage 5 chronic kidney disease or end stage renal disease: Secondary | ICD-10-CM | POA: Diagnosis not present

## 2024-05-27 DIAGNOSIS — Z992 Dependence on renal dialysis: Secondary | ICD-10-CM | POA: Diagnosis not present

## 2024-05-27 DIAGNOSIS — E785 Hyperlipidemia, unspecified: Secondary | ICD-10-CM | POA: Diagnosis not present

## 2024-05-27 DIAGNOSIS — M109 Gout, unspecified: Secondary | ICD-10-CM | POA: Diagnosis not present

## 2024-05-27 DIAGNOSIS — N186 End stage renal disease: Secondary | ICD-10-CM | POA: Diagnosis not present

## 2024-05-27 DIAGNOSIS — E1122 Type 2 diabetes mellitus with diabetic chronic kidney disease: Secondary | ICD-10-CM | POA: Diagnosis not present

## 2024-05-28 DIAGNOSIS — N2581 Secondary hyperparathyroidism of renal origin: Secondary | ICD-10-CM | POA: Diagnosis not present

## 2024-05-28 DIAGNOSIS — Z992 Dependence on renal dialysis: Secondary | ICD-10-CM | POA: Diagnosis not present

## 2024-05-28 DIAGNOSIS — N186 End stage renal disease: Secondary | ICD-10-CM | POA: Diagnosis not present

## 2024-05-30 DIAGNOSIS — N2581 Secondary hyperparathyroidism of renal origin: Secondary | ICD-10-CM | POA: Diagnosis not present

## 2024-05-30 DIAGNOSIS — Z992 Dependence on renal dialysis: Secondary | ICD-10-CM | POA: Diagnosis not present

## 2024-05-30 DIAGNOSIS — N186 End stage renal disease: Secondary | ICD-10-CM | POA: Diagnosis not present

## 2024-06-01 ENCOUNTER — Ambulatory Visit (HOSPITAL_COMMUNITY)
Admission: RE | Admit: 2024-06-01 | Discharge: 2024-06-01 | Disposition: A | Discharge status: Home / Self Care | Source: Ambulatory Visit | Attending: Physician Assistant | Admitting: Physician Assistant

## 2024-06-01 ENCOUNTER — Encounter: Payer: Self-pay | Admitting: Physician Assistant

## 2024-06-01 ENCOUNTER — Other Ambulatory Visit: Payer: Self-pay

## 2024-06-01 ENCOUNTER — Ambulatory Visit: Attending: Surgery | Admitting: Physician Assistant

## 2024-06-01 VITALS — BP 131/65 | HR 82 | Temp 98.1°F | Ht 69.0 in | Wt 186.0 lb

## 2024-06-01 DIAGNOSIS — Z992 Dependence on renal dialysis: Secondary | ICD-10-CM

## 2024-06-01 DIAGNOSIS — N2581 Secondary hyperparathyroidism of renal origin: Secondary | ICD-10-CM | POA: Diagnosis not present

## 2024-06-01 DIAGNOSIS — N186 End stage renal disease: Secondary | ICD-10-CM | POA: Diagnosis not present

## 2024-06-01 NOTE — H&P (View-Only) (Signed)
 Established Dialysis Access   History of Present Illness   Joseph Wenzlick. is a 78 y.o. (1946/07/16) male who presents for evaluation of left brachiocephalic AV fistula. He is s/p Fistulogram on 05/15/24 by Dr. Charlotte Cookey. He had successful venoplasty of a proximal area of stenosis in the cephalic vein. This was performed secondary to the fistula not maturing.  Today he reports overall doing well. Denies any pain, numbness, weakness, or coldness in left hand. He currently dialyzes via a right internal jugular TDC on TTS at the [facility] location  Current Outpatient Medications  Medication Sig Dispense Refill   amLODipine  (NORVASC ) 10 MG tablet Take 10 mg by mouth at bedtime. (2100)     aspirin  EC 81 MG tablet Take 81 mg by mouth daily. Swallow whole.     atorvastatin  (LIPITOR ) 80 MG tablet Take 80 mg by mouth at bedtime. (2100)     Febuxostat  80 MG TABS Take 1 tablet by mouth daily.     gabapentin  (NEURONTIN ) 100 MG capsule Take 1 capsule by mouth at bedtime.     hydrALAZINE  (APRESOLINE ) 25 MG tablet Take 1 tablet (25 mg total) by mouth 2 (two) times daily. 60 tablet 0   hydrALAZINE  (APRESOLINE ) 25 MG tablet Take 1 tablet (25 mg total) by mouth 2 (two) times daily. 180 tablet 0   ipratropium-albuterol  (DUONEB) 0.5-2.5 (3) MG/3ML SOLN Take 3 mLs by nebulization every 6 (six) hours as needed. 360 mL 0   levothyroxine  (SYNTHROID ) 75 MCG tablet Take 75 mcg by mouth daily.     memantine  (NAMENDA ) 10 MG tablet Take 1 tablet by mouth 2 (two) times daily.     No current facility-administered medications for this visit.    On ROS today: negative unless stated in HPI   Physical Examination   Vitals:   06/01/24 1055  BP: 131/65  Pulse: 82  Temp: 98.1 F (36.7 C)  TempSrc: Temporal  SpO2: 92%  Weight: 186 lb (84.4 kg)  Height: 5' 9 (1.753 m)   Body mass index is 27.47 kg/m.  General WDWN in no distress, in wheel chair  Pulmonary Non labored  Cardiac Regular   Vascular 2+ left  radial pulse, hand warm and well perfused. Small area of ecchymosis at [facility] fossa. Fistula with good thrill. Fistula is deep in left upper arm   Musculo- skeletal M/S 5/5 throughout  , Extremities without ischemic changes    Neurologic A&O; CN grossly intact     Non-invasive Vascular Imaging   left Arm Access Duplex  (06/01/24):  Findings:  +--------------------+----------+-----------------+--------+  AVF                PSV (cm/s)Flow Vol (mL/min)Comments  +--------------------+----------+-----------------+--------+  Native artery inflow   242           586                 +--------------------+----------+-----------------+--------+  AVF Anastomosis        448                               +--------------------+----------+-----------------+--------+     +------------+----------+-------------+----------+------------------------+   OUTFLOW VEINPSV (cm/s)Diameter (cm)Depth (cm)        Describe           +------------+----------+-------------+----------+------------------------+   Shoulder      150        0.51        0.89                              +------------+----------+-------------+----------+------------------------+  Prox UA        171        0.58        0.90                              +------------+----------+-------------+----------+------------------------+   Mid UA         147        0.56        0.65                              +------------+----------+-------------+----------+------------------------+   Dist UA        274        0.68        0.57                              +------------+----------+-------------+----------+------------------------+   AC Fossa       787        0.10        0.29   approx 1.41 cm in  length  +------------+----------+-------------+----------+------------------------+   Summary:  Patent arteriovenous fistula.    Medical Decision Making   Joseph Gosch Edelmiro Innocent. is a 79 y.o. male  who presents for re evaluation of left brachiocephalic AV fistula. He is s/p Fistulogram on 05/15/24 by Dr. Charlotte Cookey. He had successful venoplasty of a proximal area of stenosis in the cephalic vein. This was performed secondary to the fistula not maturing. Today's duplex continues to show low volume flow, vein diameter is still smaller and deep in left upper arm. Per Dr. Mick Alamin note it was recommended to ligate cephalic vein as it traverses towards wrist as well as elevate the fistula. I think this is next best step in terms of salvaging current fistula. Patient and his wife are agreeable.  Dialyzes on TTS  Recommend ligation of left cephalic vein and transposition of left brachiocephalic AV fistula. Will try to arrange this with Dr. Charlotte Cookey in the near future on a non dialysis day He has follow up on 06/17/24 for his carotid and lower extremities. He will keep this appointment as scheduled   Deneen Finical, PA-C [facility] [place] Office: [phone]  Clinic MD: Charlotte Cookey

## 2024-06-01 NOTE — Progress Notes (Signed)
 Established Dialysis Access   History of Present Illness   Joseph Wenzlick. is a 78 y.o. (1946/07/16) male who presents for evaluation of left brachiocephalic AV fistula. He is s/p Fistulogram on 05/15/24 by Dr. Charlotte Cookey. He had successful venoplasty of a proximal area of stenosis in the cephalic vein. This was performed secondary to the fistula not maturing.  Today he reports overall doing well. Denies any pain, numbness, weakness, or coldness in left hand. He currently dialyzes via a right internal jugular TDC on TTS at the [facility] location  Current Outpatient Medications  Medication Sig Dispense Refill   amLODipine  (NORVASC ) 10 MG tablet Take 10 mg by mouth at bedtime. (2100)     aspirin  EC 81 MG tablet Take 81 mg by mouth daily. Swallow whole.     atorvastatin  (LIPITOR ) 80 MG tablet Take 80 mg by mouth at bedtime. (2100)     Febuxostat  80 MG TABS Take 1 tablet by mouth daily.     gabapentin  (NEURONTIN ) 100 MG capsule Take 1 capsule by mouth at bedtime.     hydrALAZINE  (APRESOLINE ) 25 MG tablet Take 1 tablet (25 mg total) by mouth 2 (two) times daily. 60 tablet 0   hydrALAZINE  (APRESOLINE ) 25 MG tablet Take 1 tablet (25 mg total) by mouth 2 (two) times daily. 180 tablet 0   ipratropium-albuterol  (DUONEB) 0.5-2.5 (3) MG/3ML SOLN Take 3 mLs by nebulization every 6 (six) hours as needed. 360 mL 0   levothyroxine  (SYNTHROID ) 75 MCG tablet Take 75 mcg by mouth daily.     memantine  (NAMENDA ) 10 MG tablet Take 1 tablet by mouth 2 (two) times daily.     No current facility-administered medications for this visit.    On ROS today: negative unless stated in HPI   Physical Examination   Vitals:   06/01/24 1055  BP: 131/65  Pulse: 82  Temp: 98.1 F (36.7 C)  TempSrc: Temporal  SpO2: 92%  Weight: 186 lb (84.4 kg)  Height: 5' 9 (1.753 m)   Body mass index is 27.47 kg/m.  General WDWN in no distress, in wheel chair  Pulmonary Non labored  Cardiac Regular   Vascular 2+ left  radial pulse, hand warm and well perfused. Small area of ecchymosis at [facility] fossa. Fistula with good thrill. Fistula is deep in left upper arm   Musculo- skeletal M/S 5/5 throughout  , Extremities without ischemic changes    Neurologic A&O; CN grossly intact     Non-invasive Vascular Imaging   left Arm Access Duplex  (06/01/24):  Findings:  +--------------------+----------+-----------------+--------+  AVF                PSV (cm/s)Flow Vol (mL/min)Comments  +--------------------+----------+-----------------+--------+  Native artery inflow   242           586                 +--------------------+----------+-----------------+--------+  AVF Anastomosis        448                               +--------------------+----------+-----------------+--------+     +------------+----------+-------------+----------+------------------------+   OUTFLOW VEINPSV (cm/s)Diameter (cm)Depth (cm)        Describe           +------------+----------+-------------+----------+------------------------+   Shoulder      150        0.51        0.89                              +------------+----------+-------------+----------+------------------------+  Prox UA        171        0.58        0.90                              +------------+----------+-------------+----------+------------------------+   Mid UA         147        0.56        0.65                              +------------+----------+-------------+----------+------------------------+   Dist UA        274        0.68        0.57                              +------------+----------+-------------+----------+------------------------+   AC Fossa       787        0.10        0.29   approx 1.41 cm in  length  +------------+----------+-------------+----------+------------------------+   Summary:  Patent arteriovenous fistula.    Medical Decision Making   Joseph Gosch Edelmiro Innocent. is a 79 y.o. male  who presents for re evaluation of left brachiocephalic AV fistula. He is s/p Fistulogram on 05/15/24 by Dr. Charlotte Cookey. He had successful venoplasty of a proximal area of stenosis in the cephalic vein. This was performed secondary to the fistula not maturing. Today's duplex continues to show low volume flow, vein diameter is still smaller and deep in left upper arm. Per Dr. Mick Alamin note it was recommended to ligate cephalic vein as it traverses towards wrist as well as elevate the fistula. I think this is next best step in terms of salvaging current fistula. Patient and his wife are agreeable.  Dialyzes on TTS  Recommend ligation of left cephalic vein and transposition of left brachiocephalic AV fistula. Will try to arrange this with Dr. Charlotte Cookey in the near future on a non dialysis day He has follow up on 06/17/24 for his carotid and lower extremities. He will keep this appointment as scheduled   Joseph Finical, PA-C [facility] [place] Office: [phone]  Clinic MD: Charlotte Cookey

## 2024-06-02 DIAGNOSIS — Z992 Dependence on renal dialysis: Secondary | ICD-10-CM | POA: Diagnosis not present

## 2024-06-02 DIAGNOSIS — N186 End stage renal disease: Secondary | ICD-10-CM | POA: Diagnosis not present

## 2024-06-02 DIAGNOSIS — N2581 Secondary hyperparathyroidism of renal origin: Secondary | ICD-10-CM | POA: Diagnosis not present

## 2024-06-04 DIAGNOSIS — Z992 Dependence on renal dialysis: Secondary | ICD-10-CM | POA: Diagnosis not present

## 2024-06-04 DIAGNOSIS — N186 End stage renal disease: Secondary | ICD-10-CM | POA: Diagnosis not present

## 2024-06-04 DIAGNOSIS — N2581 Secondary hyperparathyroidism of renal origin: Secondary | ICD-10-CM | POA: Diagnosis not present

## 2024-06-06 DIAGNOSIS — N186 End stage renal disease: Secondary | ICD-10-CM | POA: Diagnosis not present

## 2024-06-06 DIAGNOSIS — N2581 Secondary hyperparathyroidism of renal origin: Secondary | ICD-10-CM | POA: Diagnosis not present

## 2024-06-06 DIAGNOSIS — Z992 Dependence on renal dialysis: Secondary | ICD-10-CM | POA: Diagnosis not present

## 2024-06-09 ENCOUNTER — Encounter (HOSPITAL_COMMUNITY): Payer: Self-pay | Admitting: Surgery

## 2024-06-09 ENCOUNTER — Other Ambulatory Visit: Payer: Self-pay

## 2024-06-09 DIAGNOSIS — N186 End stage renal disease: Secondary | ICD-10-CM | POA: Diagnosis not present

## 2024-06-09 DIAGNOSIS — N2581 Secondary hyperparathyroidism of renal origin: Secondary | ICD-10-CM | POA: Diagnosis not present

## 2024-06-09 DIAGNOSIS — Z992 Dependence on renal dialysis: Secondary | ICD-10-CM | POA: Diagnosis not present

## 2024-06-09 NOTE — Progress Notes (Signed)
 PCP - Dr Vicenta Saras Cardiologist - none  Chest x-ray - 02/15/24 EKG - 02/12/24 Stress Test - 01/06/19 ECHO - 01/27/17 Cardiac Cath - n/a  ICD Pacemaker/Loop - n/a  Sleep Study -  n/a CPAP - none  Diabetes Type 2, diet controlled, no meds, does not check blood sugar.  Blood Thinner Instructions:  n/a  Aspirin  Instructions: Continue   NPO  Anesthesia review: Yes  STOP now taking any Aspirin  (unless otherwise instructed by your surgeon), Aleve, Naproxen, Ibuprofen, Motrin, Advil, Goody's, BC's, all herbal medications, fish oil, and all vitamins.   Coronavirus Screening Does the patient have any of the following symptoms:  Cough yes/no: No Fever (>100.19F)  yes/no: No Runny nose yes/no: No Sore throat yes/no: No Difficulty breathing/shortness of breath  yes/no: No  Has the patient traveled in the last 14 days and where? yes/no: No  Patient's wife Ronal verbalized understanding of instructions that were given via phone.

## 2024-06-09 NOTE — Progress Notes (Signed)
 Case: [id number] Date/Time: 06/10/24 0946   Procedures:      LIGATION OF COMPETING BRANCHES OF ARTERIOVENOUS FISTULA (Left)     TRANSPOSITION, VEIN, BASILIC (Left)   Anesthesia type: Choice   Diagnosis: ESRD (end stage renal disease) (HCC) [N18.6]   Pre-op diagnosis: ESRD   [facility] / [facility]   Surgeons: Serene Gaile ORN, MD       DISCUSSION: Joseph Hebert is a 78 yo male who is SDW prior to surgery above. PMH of current smoking, HTN, CAD (by CT), PVCs, hx of PEA arrest, PAD s/p left FPBG (in 2016 with redo in 2020), carotid artery disease s/p left CEA (2011), asthma, COPD with chronic DOE, dementia, GERD, hiatal hernia, DM2, ESRD on TTS dialysis, anemia, arthritis, chronic back pain s/p lumbar fusion (L5-S1, 2018), wheelchair dependent.  Prior surgery complication includes respiratory failure and AMS requiring intubation and brief PEA arrest 4 days after lumbar fusion in 2018. Multifactorial in the setting of prolonged surgery, hypotension and AKI, extreme vagal tone in the setting of critical illness  Patient is followed by Nephrology. He was admitted from 02/10/24-02/16/24 for urgent dialysis initiation. Had L AV fistula creation with Dr. Sheree on 02/14/24. Had fistulogram on 05/15/24 with Dr. Serene due to decreased flow and is now scheduled for surgery above. He currently dialyzes via a right internal jugular TDC on TTS at the [facility] location.   While inpatient he developed acute respiratory failure with bilateral pleural effusions. He underwent a R thoracentesis with improvement in sats. Discharged on home O2.   Patient has seen Cardiology intermittently in the past. He has coronary calcifications on CT and a reportedly normal cath ~2006. Negative stress test for pre op eval in 2020. Last seen in clinic in 2021  VS:  Wt Readings from Last 3 Encounters:  06/01/24 84.4 kg  05/15/24 84.4 kg  05/06/24 79.8 kg   Temp Readings from Last 3 Encounters:  06/01/24 36.7 C  (Temporal)  05/15/24 36.8 C (Oral)  05/06/24 36.7 C (Temporal)   BP Readings from Last 3 Encounters:  06/01/24 131/65  05/15/24 (!) 147/69  05/06/24 (!) 90/58   Pulse Readings from Last 3 Encounters:  06/01/24 82  05/15/24 77  05/06/24 (!) 50     PROVIDERS: Keren Vicenta BRAVO, MD   LABS: Obtain DOS   EKG 02/12/24:  Sinus rhythm with 1st degree A-V block, rate 96 Possible Anterior infarct , age undetermined Abnormal ECG  CV: US  Carotid 10/09/23:  Summary: Right Carotid: Velocities in the right ICA are consistent with a 60-79%                stenosis. The ECA appears >50% stenosed.  Left Carotid: Velocities in the left ICA are consistent with a 1-39% stenosis.               The ECA appears >50% stenosed.  Vertebrals:  Left vertebral artery demonstrates antegrade flow. Right vertebral              artery demonstrates retrograde flow. Subclavians: Right subclavian artery was stenotic. Normal flow hemodynamics were              seen in the left subclavian artery.  *See table(s) above for measurements and observations.  Stress test 01/06/2019:  There was no ST segment deviation noted during stress. There were frequent PVCs, ventricular couplets and ventricular salvo. The perfusion study is normal. Gated images not performed due to frequent ectopy. Consider 2D  echo to assess further. This is a low risk study.   Echo 01/27/2017:  Study Conclusions  - Left ventricle: The cavity size was normal. Wall thickness was   increased in a pattern of mild LVH. Systolic function was   vigorous. The estimated ejection fraction was in the range of 65%   to 70%. Wall motion was normal; there were no regional wall   motion abnormalities. Doppler parameters are consistent with   abnormal left ventricular relaxation (grade 1 diastolic   dysfunction). - Right atrium: The atrium was mildly dilated. - Pulmonary arteries: Systolic pressure was moderately increased.   PA peak  pressure: 54 mm Hg (S).   Past Medical History:  Diagnosis Date   Acute encephalopathy    Acute hypoxemic respiratory failure (HCC)    Acute pulmonary edema (HCC)    Acute respiratory failure with hypoxia (HCC)    Anemia    Arrhythmia    PVCs; takes metoprolol  daily   Arthritis    Asthma    Bilateral leg pain 06/16/2015   CAD (coronary artery disease)    minimal, nonobstructive   Cardiac asystole (HCC)    after back surgery in 2018   Cardiogenic shock (HCC)    Carotid artery occlusion    s/p CEA in 2011   Chronic back pain    s/p lumbar fusion   COPD (chronic obstructive pulmonary disease) (HCC)    Spirva daily and Albuterol  as needed   Dementia (HCC)    Dementia (HCC)    Depression    Diabetes mellitus without complication (HCC)    Type 2 diet controlled.Never been on meds   ESRD (end stage renal disease) (HCC)    GERD (gastroesophageal reflux disease)    Gout    takes Uloric  daily   HCAP (healthcare-associated pneumonia)    Head injury    as a child   History of colon polyps    benign   History of gastric ulcer    History of hiatal hernia    Hyperlipidemia    takes Atorvastatin  and Fenofibrate  daily   Hypertension    takes Lisinopril ,Amlodipine ,and Hydralazine  daily   Intermittent claudication (HCC) 07/18/2015   s/p LLE bypass   Lumbosacral spondylosis with radiculopathy 02/17/2016   Nevus of choroid of left eye 12/08/2014   Numbness    lower left leg and upper right thigh   PAD (peripheral artery disease) (HCC)    Pneumonia    hx of-couple of yrs ago   S/P arteriovenous (AV) fistula creation - Left UE. 02-14-2024. 02/14/2024   Serous retinal detachment of left eye 12/08/2014   Shortness of breath dyspnea    with exertion   Status post intraocular lens implant 12/08/2014   Tuberculosis 2006    9 months    Vertigo    Wears glasses     Past Surgical History:  Procedure Laterality Date   A/V SHUNT INTERVENTION N/A 05/15/2024   Procedure: A/V SHUNT  INTERVENTION;  Surgeon: Serene Gaile ORN, MD;  Location: [facility];  Service: Cardiovascular;  Laterality: N/A;   ABDOMINAL AORTOGRAM W/LOWER EXTREMITY N/A 02/20/2019   Procedure: ABDOMINAL AORTOGRAM W/LOWER EXTREMITY;  Surgeon: Harvey Carlin BRAVO, MD;  [facility]: [facility] INVASIVE CV LAB;  Service: Cardiovascular;  Laterality: N/A;   ABDOMINAL EXPOSURE N/A 01/22/2017   Procedure: ABDOMINAL EXPOSURE;  Surgeon: Penne Lonni Colorado, MD;  Location: [name] OR;  Service: Vascular;  Laterality: N/A;   ANTERIOR LAT LUMBAR FUSION N/A 01/22/2017   Procedure: LUMBAR THREE-FOUR, LUMBAR FOUR-FIVE  ANTERIOR LATERAL INTERBODY FUSION;  Surgeon: Morene Hicks Ditty, MD;  Location: [facility] OR;  Service: Neurosurgery;  Laterality: N/A;  L3-4 L4-5 Lateral interbody fusion   ANTERIOR LUMBAR FUSION N/A 01/22/2017   Procedure: LUMBAR FIVE-SACRUM ONE ANTERIOR LUMBAR INTERBODY FUSION WITH ABDOMINAL APPROACH BY DR SHEREE;  Surgeon: Morene Hicks Ditty, MD;  Location[facility] OR;  Service: Neurosurgery;  Laterality: N/A;   AV FISTULA PLACEMENT Left 02/14/2024   Procedure: LEFT ARM BRACHIOCEPHALIC ARTERIOVENOUS FISTULA CREATION;  Surgeon: SHEREE Penne Bruckner, MD;  Location: [facility] OR;  Service: Vascular;  Laterality: Left;   CAROTID BODY TUMOR EXCISION Left 09/21/2015   Procedure: EXCISE LEFT NECK NODULE WITH LOCAL ;  Surgeon: Carlin FORBES Haddock, MD;  Location: [facility] OR;  Service: Vascular;  Laterality: Left;   CAROTID ENDARTERECTOMY  Nov. 15,2011   LEFT cea   cataract surgery Bilateral    COLONOSCOPY     EYE SURGERY     FEMORAL-POPLITEAL BYPASS GRAFT Left 08/23/2015   Procedure: LEFT FEMORAL-POPLITEAL ARTERY BYPASS WITH GORETEX GRAFT;  Surgeon: Carlin FORBES Haddock, MD;  Location: [facility] OR;  Service: Vascular;  Laterality: Left;   FEMORAL-POPLITEAL BYPASS GRAFT Left 02/23/2019   Procedure: REDO LEFT FEMORAL TO POPLITEAL ARTERY BYPASS GRAFT Using Propaten graft;  Surgeon: Haddock Carlin FORBES, MD;  Location: [facility] OR;  Service: Vascular;  Laterality: Left;   IR  FLUORO GUIDE CV LINE RIGHT  02/12/2024   IR THORACENTESIS ASP PLEURAL SPACE W/IMG GUIDE  02/13/2024   IR US  GUIDE VASC ACCESS RIGHT  02/12/2024   JOINT REPLACEMENT  1980   RIGHT  knee   LUMBAR EPIDURAL INJECTION     LUMBAR LAMINECTOMY/DECOMPRESSION MICRODISCECTOMY Right 02/17/2016   Procedure: Laminectomy and Foraminotomy - Lumbar four-five right;  Surgeon: Morene Hicks Ditty, MD;  [facility] NEURO ORS;  Service: Neurosurgery;  Laterality: Right;  right   LUMBAR PERCUTANEOUS PEDICLE SCREW 3 LEVEL N/A 01/22/2017   Procedure: LUMBAR THREE-SACRAL ONE PERCUTANEOUS PEDICLE SCREW FIXATION WITH ROBOTIC ASSISTANCE;  Surgeon: Morene Hicks Ditty, MD;  Location: [facility] OR;  Service: Neurosurgery;  Laterality: N/A;  L3 to S1 Percutaneous pedicle screw fixation   PERIPHERAL VASCULAR CATHETERIZATION N/A 06/24/2015   Procedure: Abdominal Aortogram;  Surgeon: Carlin FORBES Haddock, MD;  Location: [facility] INVASIVE CV LAB;  Service: Cardiovascular;  Laterality: N/A;   Stents  Aug.  23, 1999   Bilateral iliofemoral  stents, [facility]   TONSILLECTOMY     VENOUS ANGIOPLASTY  05/15/2024   Procedure: VENOUS ANGIOPLASTY;  Surgeon: Serene Gaile ORN, MD;  Location: [facility];  Service: Cardiovascular;;    MEDICATIONS: No current facility-administered medications for this encounter.    aspirin  EC 81 MG tablet   atorvastatin  (LIPITOR ) 80 MG tablet   Febuxostat  80 MG TABS   gabapentin  (NEURONTIN ) 100 MG capsule   hydrALAZINE  (APRESOLINE ) 25 MG tablet   levothyroxine  (SYNTHROID ) 75 MCG tablet   memantine  (NAMENDA ) 10 MG tablet   SENSIPAR 30 MG tablet   sevelamer  carbonate (RENVELA ) 800 MG tablet   ipratropium-albuterol  (DUONEB) 0.5-2.5 (3) MG/3ML SOLN   Jamyria Ozanich M Correne Lalani, PA-C [facility] Surgical Short Stay/Anesthesiology [facility] Phone [phone] 06/09/2024 9:52 AM

## 2024-06-10 ENCOUNTER — Ambulatory Visit (HOSPITAL_COMMUNITY): Payer: Self-pay | Admitting: Medical

## 2024-06-10 ENCOUNTER — Encounter (HOSPITAL_COMMUNITY): Payer: Self-pay | Admitting: Surgery

## 2024-06-10 ENCOUNTER — Ambulatory Visit (HOSPITAL_BASED_OUTPATIENT_CLINIC_OR_DEPARTMENT_OTHER): Payer: Self-pay | Admitting: Medical

## 2024-06-10 ENCOUNTER — Ambulatory Visit (HOSPITAL_COMMUNITY)
Admission: RE | Admit: 2024-06-10 | Discharge: 2024-06-10 | Disposition: A | Discharge status: Home / Self Care | Attending: Surgery | Admitting: Surgery

## 2024-06-10 ENCOUNTER — Other Ambulatory Visit: Payer: Self-pay

## 2024-06-10 ENCOUNTER — Encounter (HOSPITAL_COMMUNITY)
Admission: RE | Disposition: A | Discharge status: Home / Self Care | Payer: Self-pay | Source: Home / Self Care | Attending: Surgery

## 2024-06-10 ENCOUNTER — Other Ambulatory Visit (HOSPITAL_COMMUNITY): Payer: Self-pay

## 2024-06-10 DIAGNOSIS — Z992 Dependence on renal dialysis: Secondary | ICD-10-CM | POA: Insufficient documentation

## 2024-06-10 DIAGNOSIS — M1990 Unspecified osteoarthritis, unspecified site: Secondary | ICD-10-CM | POA: Diagnosis not present

## 2024-06-10 DIAGNOSIS — K449 Diaphragmatic hernia without obstruction or gangrene: Secondary | ICD-10-CM | POA: Diagnosis not present

## 2024-06-10 DIAGNOSIS — E1122 Type 2 diabetes mellitus with diabetic chronic kidney disease: Secondary | ICD-10-CM | POA: Insufficient documentation

## 2024-06-10 DIAGNOSIS — I2510 Atherosclerotic heart disease of native coronary artery without angina pectoris: Secondary | ICD-10-CM | POA: Diagnosis not present

## 2024-06-10 DIAGNOSIS — I120 Hypertensive chronic kidney disease with stage 5 chronic kidney disease or end stage renal disease: Secondary | ICD-10-CM | POA: Insufficient documentation

## 2024-06-10 DIAGNOSIS — Z993 Dependence on wheelchair: Secondary | ICD-10-CM | POA: Insufficient documentation

## 2024-06-10 DIAGNOSIS — N186 End stage renal disease: Secondary | ICD-10-CM | POA: Diagnosis not present

## 2024-06-10 DIAGNOSIS — F17210 Nicotine dependence, cigarettes, uncomplicated: Secondary | ICD-10-CM | POA: Insufficient documentation

## 2024-06-10 DIAGNOSIS — G8929 Other chronic pain: Secondary | ICD-10-CM | POA: Insufficient documentation

## 2024-06-10 DIAGNOSIS — D649 Anemia, unspecified: Secondary | ICD-10-CM | POA: Diagnosis not present

## 2024-06-10 DIAGNOSIS — E1151 Type 2 diabetes mellitus with diabetic peripheral angiopathy without gangrene: Secondary | ICD-10-CM | POA: Insufficient documentation

## 2024-06-10 DIAGNOSIS — F0390 Unspecified dementia without behavioral disturbance: Secondary | ICD-10-CM | POA: Insufficient documentation

## 2024-06-10 DIAGNOSIS — T82590A Other mechanical complication of surgically created arteriovenous fistula, initial encounter: Secondary | ICD-10-CM

## 2024-06-10 DIAGNOSIS — Z9981 Dependence on supplemental oxygen: Secondary | ICD-10-CM | POA: Diagnosis not present

## 2024-06-10 DIAGNOSIS — Z79899 Other long term (current) drug therapy: Secondary | ICD-10-CM | POA: Diagnosis not present

## 2024-06-10 DIAGNOSIS — J4489 Other specified chronic obstructive pulmonary disease: Secondary | ICD-10-CM | POA: Diagnosis not present

## 2024-06-10 DIAGNOSIS — E039 Hypothyroidism, unspecified: Secondary | ICD-10-CM | POA: Insufficient documentation

## 2024-06-10 DIAGNOSIS — I493 Ventricular premature depolarization: Secondary | ICD-10-CM | POA: Diagnosis not present

## 2024-06-10 HISTORY — DX: End stage renal disease: N18.6

## 2024-06-10 HISTORY — PX: LIGATION OF COMPETING BRANCHES OF ARTERIOVENOUS FISTULA: SHX5949

## 2024-06-10 HISTORY — PX: FISTULA SUPERFICIALIZATION: SHX6341

## 2024-06-10 LAB — POCT I-STAT, CHEM 8
BUN: 52 mg/dL — ABNORMAL HIGH (ref 8–23)
Calcium, Ion: 0.99 mmol/L — ABNORMAL LOW (ref 1.15–1.40)
Chloride: 100 mmol/L (ref 98–111)
Creatinine, Ser: 5.1 mg/dL — ABNORMAL HIGH (ref 0.61–1.24)
Glucose, Bld: 99 mg/dL (ref 70–99)
HCT: 38 % — ABNORMAL LOW (ref 39.0–52.0)
Hemoglobin: 12.9 g/dL — ABNORMAL LOW (ref 13.0–17.0)
Potassium: 4.2 mmol/L (ref 3.5–5.1)
Sodium: 135 mmol/L (ref 135–145)
TCO2: 25 mmol/L (ref 22–32)

## 2024-06-10 LAB — GLUCOSE, CAPILLARY
Glucose-Capillary: 107 mg/dL — ABNORMAL HIGH (ref 70–99)
Glucose-Capillary: 100 mg/dL — ABNORMAL HIGH (ref 70–99)

## 2024-06-10 SURGERY — LIGATION OF COMPETING BRANCHES OF ARTERIOVENOUS FISTULA
Anesthesia: General | Site: Arm Lower | Laterality: Left

## 2024-06-10 MED ORDER — PROPOFOL 10 MG/ML IV BOLUS
INTRAVENOUS | Status: AC
  Filled 2024-06-10: qty 20

## 2024-06-10 MED ORDER — FENTANYL CITRATE (PF) 250 MCG/5ML IJ SOLN
INTRAMUSCULAR | Status: AC
  Filled 2024-06-10: qty 5

## 2024-06-10 MED ORDER — BUPIVACAINE LIPOSOME 1.3 % IJ SUSP
INTRAMUSCULAR | Status: AC
  Filled 2024-06-10: qty 20

## 2024-06-10 MED ORDER — FENTANYL CITRATE (PF) 100 MCG/2ML IJ SOLN: 25.0000 ug | INTRAMUSCULAR | Status: DC | PRN

## 2024-06-10 MED ORDER — ONDANSETRON HCL 4 MG/2ML IJ SOLN
INTRAMUSCULAR | Status: DC | PRN
  Administered 2024-06-10: 4 mg via INTRAVENOUS

## 2024-06-10 MED ORDER — LIDOCAINE-EPINEPHRINE (PF) 1 %-1:200000 IJ SOLN
INTRAMUSCULAR | Status: AC
  Filled 2024-06-10: qty 30

## 2024-06-10 MED ORDER — CEFAZOLIN SODIUM-DEXTROSE 2-4 GM/100ML-% IV SOLN
2.0000 g | INTRAVENOUS | Status: AC
  Administered 2024-06-10: 2 g via INTRAVENOUS
  Filled 2024-06-10: qty 100

## 2024-06-10 MED ORDER — BUPIVACAINE HCL (PF) 0.5 % IJ SOLN
INTRAMUSCULAR | Status: AC
Start: 2024-06-10 — End: 2024-06-10
  Filled 2024-06-10: qty 10

## 2024-06-10 MED ORDER — SODIUM CHLORIDE 0.9 % IV SOLN: INTRAVENOUS | Status: DC

## 2024-06-10 MED ORDER — CHLORHEXIDINE GLUCONATE 0.12 % MT SOLN
15.0000 mL | Freq: Once | OROMUCOSAL | Status: AC
  Administered 2024-06-10: 15 mL via OROMUCOSAL
  Filled 2024-06-10: qty 15

## 2024-06-10 MED ORDER — ONDANSETRON HCL 4 MG/2ML IJ SOLN: 4.0000 mg | Freq: Once | INTRAMUSCULAR | Status: DC | PRN

## 2024-06-10 MED ORDER — LIDOCAINE 2% (20 MG/ML) 5 ML SYRINGE
INTRAMUSCULAR | Status: AC
  Filled 2024-06-10: qty 5

## 2024-06-10 MED ORDER — HEPARIN 6000 UNIT IRRIGATION SOLUTION
Status: DC | PRN
  Administered 2024-06-10: 1

## 2024-06-10 MED ORDER — PROPOFOL 10 MG/ML IV BOLUS
INTRAVENOUS | Status: DC | PRN
  Administered 2024-06-10: 100 mg via INTRAVENOUS
  Administered 2024-06-10: 10 mg via INTRAVENOUS

## 2024-06-10 MED ORDER — HEPARIN 6000 UNIT IRRIGATION SOLUTION
Status: AC
  Filled 2024-06-10: qty 500

## 2024-06-10 MED ORDER — EPHEDRINE SULFATE-NACL 50-0.9 MG/10ML-% IV SOSY
PREFILLED_SYRINGE | INTRAVENOUS | Status: DC | PRN
  Administered 2024-06-10: 10 mg via INTRAVENOUS
  Administered 2024-06-10: 5 mg via INTRAVENOUS

## 2024-06-10 MED ORDER — PHENYLEPHRINE HCL-NACL 20-0.9 MG/250ML-% IV SOLN
INTRAVENOUS | Status: DC | PRN
  Administered 2024-06-10: 60 ug/min via INTRAVENOUS

## 2024-06-10 MED ORDER — ORAL CARE MOUTH RINSE: 15.0000 mL | Freq: Once | OROMUCOSAL | Status: AC

## 2024-06-10 MED ORDER — 0.9 % SODIUM CHLORIDE (POUR BTL) OPTIME
TOPICAL | Status: DC | PRN
Start: 2024-06-10 — End: 2024-06-10
  Administered 2024-06-10: 1000 mL

## 2024-06-10 MED ORDER — PHENYLEPHRINE 80 MCG/ML (10ML) SYRINGE FOR IV PUSH (FOR BLOOD PRESSURE SUPPORT)
PREFILLED_SYRINGE | INTRAVENOUS | Status: DC | PRN
  Administered 2024-06-10: 160 ug via INTRAVENOUS

## 2024-06-10 MED ORDER — CHLORHEXIDINE GLUCONATE 4 % EX SOLN: 60.0000 mL | Freq: Once | CUTANEOUS | Status: DC

## 2024-06-10 MED ORDER — LIDOCAINE 2% (20 MG/ML) 5 ML SYRINGE
INTRAMUSCULAR | Status: DC | PRN
  Administered 2024-06-10: 100 mg via INTRAVENOUS

## 2024-06-10 MED ORDER — SODIUM CHLORIDE 0.9% FLUSH: 3.0000 mL | INTRAVENOUS | Status: DC | PRN

## 2024-06-10 MED ORDER — FENTANYL CITRATE (PF) 250 MCG/5ML IJ SOLN
INTRAMUSCULAR | Status: DC | PRN
  Administered 2024-06-10 (×3): 25 ug via INTRAVENOUS

## 2024-06-10 MED ORDER — ACETAMINOPHEN 500 MG PO TABS
1000.0000 mg | ORAL_TABLET | Freq: Once | ORAL | Status: AC
  Administered 2024-06-10: 1000 mg via ORAL
  Filled 2024-06-10: qty 2

## 2024-06-10 MED ORDER — HYDROCODONE-ACETAMINOPHEN 5-325 MG PO TABS
1.0000 | ORAL_TABLET | Freq: Four times a day (QID) | ORAL | 0 refills | Status: DC | PRN
  Filled 2024-06-10: qty 15, 4d supply, fill #0

## 2024-06-10 SURGICAL SUPPLY — 36 items
ARMBAND PINK RESTRICT EXTREMIT (MISCELLANEOUS) ×2 IMPLANT
BAG COUNTER SPONGE SURGICOUNT (BAG) ×2 IMPLANT
BNDG ELASTIC 4INX 5YD STR LF (GAUZE/BANDAGES/DRESSINGS) ×1 IMPLANT
BNDG ELASTIC 6INX 5YD STR LF (GAUZE/BANDAGES/DRESSINGS) ×1 IMPLANT
BNDG GAUZE DERMACEA FLUFF 4 (GAUZE/BANDAGES/DRESSINGS) ×1 IMPLANT
CANISTER SUCTION 3000ML PPV (SUCTIONS) ×2 IMPLANT
CLIP TI MEDIUM 24 (CLIP) IMPLANT
CLIP TI MEDIUM 6 (CLIP) ×2 IMPLANT
CLIP TI WIDE RED SMALL 24 (CLIP) IMPLANT
CLIP TI WIDE RED SMALL 6 (CLIP) ×2 IMPLANT
CNTNR URN SCR LID CUP LEK RST (MISCELLANEOUS) ×2 IMPLANT
COVER PROBE W GEL 5X96 (DRAPES) ×2 IMPLANT
DERMABOND ADVANCED .7 DNX12 (GAUZE/BANDAGES/DRESSINGS) ×2 IMPLANT
ELECTRODE REM PT RTRN 9FT ADLT (ELECTROSURGICAL) ×2 IMPLANT
GLOVE SURG SS PI 7.5 STRL IVOR (GLOVE) ×6 IMPLANT
GOWN STRL REUS W/ TWL LRG LVL3 (GOWN DISPOSABLE) ×4 IMPLANT
GOWN STRL REUS W/ TWL XL LVL3 (GOWN DISPOSABLE) ×2 IMPLANT
HEMOSTAT SNOW SURGICEL 2X4 (HEMOSTASIS) IMPLANT
KIT BASIN OR (CUSTOM PROCEDURE TRAY) ×2 IMPLANT
KIT TURNOVER KIT B (KITS) ×2 IMPLANT
NDL 18GX1X1/2 (RX/OR ONLY) (NEEDLE) ×1 IMPLANT
NEEDLE 18GX1X1/2 (RX/OR ONLY) (NEEDLE) ×2 IMPLANT
NS IRRIG 1000ML POUR BTL (IV SOLUTION) ×2 IMPLANT
PACK CV ACCESS (CUSTOM PROCEDURE TRAY) ×2 IMPLANT
PAD ARMBOARD POSITIONER FOAM (MISCELLANEOUS) ×4 IMPLANT
SLING ARM FOAM STRAP LRG (SOFTGOODS) IMPLANT
SUT ETHILON 3 0 PS 1 (SUTURE) IMPLANT
SUT PROLENE 6 0 BV (SUTURE) ×2 IMPLANT
SUT SILK 0 TIES 10X30 (SUTURE) ×2 IMPLANT
SUT SILK 2 0 SH (SUTURE) IMPLANT
SUT VIC AB 3-0 SH 27X BRD (SUTURE) ×4 IMPLANT
SUT VIC AB 4-0 PS2 18 (SUTURE) ×2 IMPLANT
SYR 30ML LL (SYRINGE) ×2 IMPLANT
TOWEL GREEN STERILE (TOWEL DISPOSABLE) ×2 IMPLANT
UNDERPAD 30X36 HEAVY ABSORB (UNDERPADS AND DIAPERS) ×2 IMPLANT
WATER STERILE IRR 1000ML POUR (IV SOLUTION) ×2 IMPLANT

## 2024-06-10 NOTE — Discharge Instructions (Signed)

## 2024-06-10 NOTE — Anesthesia Procedure Notes (Signed)
 Procedure Name: LMA Insertion Date/Time: 06/10/2024 10:01 AM  Performed by: Atanacio Arland HERO, CRNAPre-anesthesia Checklist: Patient identified, Emergency Drugs available, Suction available and Patient being monitored Patient Re-evaluated:Patient Re-evaluated prior to induction Oxygen  Delivery Method: Circle System Utilized Preoxygenation: Pre-oxygenation with 100% oxygen  Induction Type: IV induction Ventilation: Mask ventilation without difficulty LMA: LMA inserted LMA Size: 4.0 Number of attempts: 1 Placement Confirmation: positive ETCO2 Tube secured with: Tape Dental Injury: Teeth and Oropharynx as per pre-operative assessment

## 2024-06-10 NOTE — Progress Notes (Signed)
 Dr. Corinne made aware of patient's BP readings upon arrival to Short Stay. 84/55 and 98/41.Ok per Dr. Corinne. No new orders received.

## 2024-06-10 NOTE — Interval H&P Note (Signed)
 History and Physical Interval Note:  06/10/2024 9:31 AM  Joseph Hebert.  has presented today for surgery, with the diagnosis of ESRD.  The various methods of treatment have been discussed with the patient and family. After consideration of risks, benefits and other options for treatment, the patient has consented to  Procedure(s): LIGATION OF COMPETING BRANCHES OF ARTERIOVENOUS FISTULA (Left) TRANSPOSITION, VEIN, BASILIC (Left) as a surgical intervention.  The patient's history has been reviewed, patient examined, no change in status, stable for surgery.  I have reviewed the patient's chart and labs.  Questions were answered to the patient's satisfaction.     [name]  After fistulogram, he needs elevation and branch ligation.  Discussed with patinet and he agrees WB

## 2024-06-10 NOTE — Anesthesia Preprocedure Evaluation (Signed)
 Anesthesia Evaluation  Patient identified by MRN, date of birth, ID band Patient awake    Reviewed: Allergy & Precautions, NPO status , Patient's Chart, lab work & pertinent test results  Airway Mallampati: III  TM Distance: >3 FB Neck ROM: Full    Dental  (+) Teeth Intact, Dental Advisory Given   Pulmonary asthma , COPD, Current SmokerPatient did not abstain from smoking.   Pulmonary exam normal breath sounds clear to auscultation       Cardiovascular hypertension, Pt. on medications + CAD and + Peripheral Vascular Disease  Normal cardiovascular exam Rhythm:Regular Rate:Normal     Neuro/Psych  PSYCHIATRIC DISORDERS  Depression   Dementia  Neuromuscular disease    GI/Hepatic negative GI ROS, Neg liver ROS,,,  Endo/Other  diabetes, Type 2Hypothyroidism    Renal/GU ESRFRenal disease (TTHSAT)     Musculoskeletal  (+) Arthritis ,    Abdominal   Peds  Hematology  (+) Blood dyscrasia, anemia   Anesthesia Other Findings Day of surgery medications reviewed with the patient.  Reproductive/Obstetrics                              Anesthesia Physical Anesthesia Plan  ASA: 3  Anesthesia Plan: General   Post-op Pain Management: Tylenol  PO (pre-op)*   Induction: Intravenous  PONV Risk Score and Plan: 1 and Dexamethasone  and Ondansetron   Airway Management Planned: LMA  Additional Equipment:   Intra-op Plan:   Post-operative Plan: Extubation in OR  Informed Consent: I have reviewed the patients History and Physical, chart, labs and discussed the procedure including the risks, benefits and alternatives for the proposed anesthesia with the patient or authorized representative who has indicated his/her understanding and acceptance.     Dental advisory given  Plan Discussed with: CRNA  Anesthesia Plan Comments:          Anesthesia Quick Evaluation

## 2024-06-10 NOTE — Transfer of Care (Signed)
 Immediate Anesthesia Transfer of Care Note  Patient: Joseph Hebert.  Procedure(s) Performed: LIGATION OF COMPETING BRANCHES OF ARTERIOVENOUS FISTULA (Left: Arm Lower) FISTULA SUPERFICIALIZATION (Left: Arm Lower)  Patient Location: PACU  Anesthesia Type:General  Level of Consciousness: drowsy  Airway & Oxygen  Therapy: Patient Spontanous Breathing and Patient connected to nasal cannula oxygen   Post-op Assessment: Report given to RN and Post -op Vital signs reviewed and stable  Post vital signs: Reviewed and stable  Last Vitals:  Vitals Value Taken Time  BP 112/49 06/10/24 12:00  Temp    Pulse 39 06/10/24 12:01  Resp 12 06/10/24 12:01  SpO2 97 % 06/10/24 12:01  Vitals shown include unfiled device data.  Last Pain:  Vitals:   06/10/24 0758  TempSrc: Oral  PainSc: 0-No pain      Patients Stated Pain Goal: 0 (06/10/24 0758)  Complications: No notable events documented.

## 2024-06-10 NOTE — Anesthesia Postprocedure Evaluation (Signed)
 Anesthesia Post Note  Patient: Joseph Hebert.  Procedure(s) Performed: LIGATION OF COMPETING BRANCHES OF ARTERIOVENOUS FISTULA (Left: Arm Lower) FISTULA SUPERFICIALIZATION (Left: Arm Lower)     Patient location during evaluation: PACU Anesthesia Type: General Level of consciousness: awake and alert Pain management: pain level controlled Vital Signs Assessment: post-procedure vital signs reviewed and stable Respiratory status: spontaneous breathing, nonlabored ventilation and respiratory function stable Cardiovascular status: blood pressure returned to baseline and stable Postop Assessment: no apparent nausea or vomiting Anesthetic complications: no   No notable events documented.  Last Vitals:  Vitals:   06/10/24 1230 06/10/24 1231  BP: 97/64   Pulse: 84 76  Resp: 14 15  Temp: 36.4 C   SpO2: 95% 95%    Last Pain:  Vitals:   06/10/24 1230  TempSrc:   PainSc: 0-No pain                 Garnette FORBES Skillern

## 2024-06-10 NOTE — Op Note (Signed)
    Patient name: Joseph Hebert. MRN: [id number] DOB: 1946-09-20 Sex: male  06/10/2024 Pre-operative Diagnosis: ESRD Post-operative diagnosis:  Same Surgeon:  Malvina New Assistants:  Adina Sender, PA Procedure:   Revision of left arm fistula (elevation and branch ligation) Anesthesia:  General Blood Loss:  minimal Specimens:  none  Findings: Several large branches were ligated.  The fistula measured 5 to 6 mm throughout.  The subcutaneous tissue was closed posterior to the fistula and the skin directly anterior.  The fistula will be ready for access in 4 weeks  Indications: This is a 78 year old gentleman who is status post left brachiocephalic fistula.  He has undergone venoplasty.  He now has come in for revision of his fistula  Procedure:  The patient was identified in the holding area and taken to [facility] OR ROOM 16  The patient was then placed supine on the table. general anesthesia was administered.  The patient was prepped and draped in the usual sterile fashion.  A time out was called and antibiotics were administered.  A PA was necessary to expedite the procedure and assist with technical details.  He helped with exposure by providing suction and retraction.  He helped with wound closure.  Ultrasound was used to map the course of the cephalic vein in the upper arm.  2 longitudinal incisions were made over top of the fistula.  I began distally and circumferentially exposed the fistula.  Multiple side branches were ligated.  The biggest branch was the cephalic vein as it went towards the hand.  Once this was tied off, there was a significant improvement in the thrill in the fistula.  I did not divide this vein but rather placed to 2-0 silk ties and a metal clip.  I then fully mobilized the vein through a second incision in the upper arm ligating several branches.  Once the fistula was fully exposed from antecubital crease up to the shoulder, I reapproximated the subcutaneous tissue with  interrupted 3-0 Vicryl.  4-0 Vicryl was used to close the skin directly anterior to the fistula followed by Dermabond and sterile dressings.  There were no immediate complications.   Disposition: To PACU stable.   ALONSO Malvina New, M.D., [facility] [place] Office: [phone] Pager:  [phone]

## 2024-06-11 ENCOUNTER — Encounter (HOSPITAL_COMMUNITY): Payer: Self-pay | Admitting: Surgery

## 2024-06-11 DIAGNOSIS — Z992 Dependence on renal dialysis: Secondary | ICD-10-CM | POA: Diagnosis not present

## 2024-06-11 DIAGNOSIS — N186 End stage renal disease: Secondary | ICD-10-CM | POA: Diagnosis not present

## 2024-06-11 DIAGNOSIS — N2581 Secondary hyperparathyroidism of renal origin: Secondary | ICD-10-CM | POA: Diagnosis not present

## 2024-06-13 DIAGNOSIS — N186 End stage renal disease: Secondary | ICD-10-CM | POA: Diagnosis not present

## 2024-06-13 DIAGNOSIS — Z992 Dependence on renal dialysis: Secondary | ICD-10-CM | POA: Diagnosis not present

## 2024-06-13 DIAGNOSIS — N2581 Secondary hyperparathyroidism of renal origin: Secondary | ICD-10-CM | POA: Diagnosis not present

## 2024-06-15 DIAGNOSIS — E1122 Type 2 diabetes mellitus with diabetic chronic kidney disease: Secondary | ICD-10-CM | POA: Diagnosis not present

## 2024-06-15 DIAGNOSIS — Z992 Dependence on renal dialysis: Secondary | ICD-10-CM | POA: Diagnosis not present

## 2024-06-15 DIAGNOSIS — N186 End stage renal disease: Secondary | ICD-10-CM | POA: Diagnosis not present

## 2024-06-16 DIAGNOSIS — N2581 Secondary hyperparathyroidism of renal origin: Secondary | ICD-10-CM | POA: Diagnosis not present

## 2024-06-16 DIAGNOSIS — Z992 Dependence on renal dialysis: Secondary | ICD-10-CM | POA: Diagnosis not present

## 2024-06-16 DIAGNOSIS — N186 End stage renal disease: Secondary | ICD-10-CM | POA: Diagnosis not present

## 2024-06-17 ENCOUNTER — Ambulatory Visit (INDEPENDENT_AMBULATORY_CARE_PROVIDER_SITE_OTHER): Admitting: Physician Assistant

## 2024-06-17 ENCOUNTER — Ambulatory Visit (HOSPITAL_BASED_OUTPATIENT_CLINIC_OR_DEPARTMENT_OTHER)
Admission: RE | Admit: 2024-06-17 | Discharge: 2024-06-17 | Disposition: A | Discharge status: Home / Self Care | Source: Ambulatory Visit | Attending: Vascular Surgery | Admitting: Vascular Surgery

## 2024-06-17 ENCOUNTER — Ambulatory Visit (HOSPITAL_COMMUNITY)
Admission: RE | Admit: 2024-06-17 | Discharge: 2024-06-17 | Disposition: A | Discharge status: Home / Self Care | Source: Ambulatory Visit | Attending: Vascular Surgery | Admitting: Vascular Surgery

## 2024-06-17 VITALS — BP 124/56 | HR 77 | Temp 98.0°F | Ht 69.0 in | Wt 186.0 lb

## 2024-06-17 DIAGNOSIS — I739 Peripheral vascular disease, unspecified: Secondary | ICD-10-CM

## 2024-06-17 DIAGNOSIS — M79605 Pain in left leg: Secondary | ICD-10-CM

## 2024-06-17 DIAGNOSIS — M79604 Pain in right leg: Secondary | ICD-10-CM | POA: Diagnosis not present

## 2024-06-17 DIAGNOSIS — J189 Pneumonia, unspecified organism: Secondary | ICD-10-CM | POA: Diagnosis not present

## 2024-06-17 DIAGNOSIS — I6523 Occlusion and stenosis of bilateral carotid arteries: Secondary | ICD-10-CM

## 2024-06-17 LAB — VAS US ABI WITH/WO TBI
Left ABI: 0.88
Right ABI: 0.58

## 2024-06-18 DIAGNOSIS — Z992 Dependence on renal dialysis: Secondary | ICD-10-CM | POA: Diagnosis not present

## 2024-06-18 DIAGNOSIS — N2581 Secondary hyperparathyroidism of renal origin: Secondary | ICD-10-CM | POA: Diagnosis not present

## 2024-06-18 DIAGNOSIS — N186 End stage renal disease: Secondary | ICD-10-CM | POA: Diagnosis not present

## 2024-06-20 DIAGNOSIS — Z992 Dependence on renal dialysis: Secondary | ICD-10-CM | POA: Diagnosis not present

## 2024-06-20 DIAGNOSIS — N186 End stage renal disease: Secondary | ICD-10-CM | POA: Diagnosis not present

## 2024-06-20 DIAGNOSIS — N2581 Secondary hyperparathyroidism of renal origin: Secondary | ICD-10-CM | POA: Diagnosis not present

## 2024-06-22 ENCOUNTER — Other Ambulatory Visit: Payer: Self-pay

## 2024-06-22 DIAGNOSIS — I739 Peripheral vascular disease, unspecified: Secondary | ICD-10-CM

## 2024-06-22 DIAGNOSIS — I6523 Occlusion and stenosis of bilateral carotid arteries: Secondary | ICD-10-CM

## 2024-06-22 NOTE — Progress Notes (Signed)
 Office Note   History of Present Illness   Joseph Hebert. is a 78 y.o. (24-Oct-1946) male who presents for surveillance of PAD and carotid artery stenosis.  He has a history of several vascular interventions including:   1) left carotid endarterectomy in 2011 by Dr. Harvey 2) bilateral common iliac artery stenting in July 2016 by Dr. Harvey 3) left femoral to above-knee popliteal artery bypass with PTFE in September 2016 by Dr. Harvey 4) left femoral to above-knee popliteal artery bypass in March 2020 by Dr. Harvey  He returns today for follow-up.  He says that he is doing well without any new health complaints.  He denies any rest pain or tissue loss.  He does not walk enough to claudicate.  He also denies any strokelike symptoms such as slurred speech, facial droop, sudden visual changes, or sudden weakness/numbness.  Current Outpatient Medications  Medication Sig Dispense Refill   aspirin  EC 81 MG tablet Take 81 mg by mouth daily. Swallow whole.     atorvastatin  (LIPITOR ) 80 MG tablet Take 80 mg by mouth at bedtime. (2100)     Febuxostat  80 MG TABS Take 80 mg by mouth daily.     gabapentin  (NEURONTIN ) 100 MG capsule Take 200 capsules by mouth at bedtime.     hydrALAZINE  (APRESOLINE ) 25 MG tablet Take 1 tablet (25 mg total) by mouth 2 (two) times daily. 60 tablet 0   HYDROcodone -acetaminophen  (NORCO/VICODIN) 5-325 MG tablet Take 1 tablet by mouth every 6 (six) hours as needed for moderate pain (pain score 4-6). 15 tablet 0   ipratropium-albuterol  (DUONEB) 0.5-2.5 (3) MG/3ML SOLN Take 3 mLs by nebulization every 6 (six) hours as needed. 360 mL 0   levothyroxine  (SYNTHROID ) 75 MCG tablet Take 75 mcg by mouth daily.     memantine  (NAMENDA ) 10 MG tablet Take 1 tablet by mouth 2 (two) times daily.     SENSIPAR 30 MG tablet Take 30 mg by mouth every Monday, Wednesday, and Friday.     sevelamer  carbonate (RENVELA ) 800 MG tablet Take 800 mg by mouth 3 (three) times daily.     No current  facility-administered medications for this visit.    REVIEW OF SYSTEMS (negative unless checked):   Cardiac:  []  Chest pain or chest pressure? []  Shortness of breath upon activity? []  Shortness of breath when lying flat? []  Irregular heart rhythm?  Vascular:  []  Pain in calf, thigh, or hip brought on by walking? []  Pain in feet at night that wakes you up from your sleep? []  Blood clot in your veins? []  Leg swelling?  Pulmonary:  []  Oxygen  at home? []  Productive cough? []  Wheezing?  Neurologic:  []  Sudden weakness in arms or legs? []  Sudden numbness in arms or legs? []  Sudden onset of difficult speaking or slurred speech? []  Temporary loss of vision in one eye? []  Problems with dizziness?  Gastrointestinal:  []  Blood in stool? []  Vomited blood?  Genitourinary:  []  Burning when urinating? []  Blood in urine?  Psychiatric:  []  Major depression  Hematologic:  []  Bleeding problems? []  Problems with blood clotting?  Dermatologic:  []  Rashes or ulcers?  Constitutional:  []  Fever or chills?  Ear/Nose/Throat:  []  Change in hearing? []  Nose bleeds? []  Sore throat?  Musculoskeletal:  []  Back pain? []  Joint pain? []  Muscle pain?   Physical Examination   Vitals:   06/17/24 1042  BP: (!) 124/56  Pulse: 77  Temp: 98 F (36.7 C)  TempSrc: Temporal  SpO2: 93%  Weight: 186 lb (84.4 kg)  Height: 5' 9 (1.753 m)   Body mass index is 27.47 kg/m.  General:  WDWN in NAD; vital signs documented above Gait: Not observed HENT: WNL, normocephalic Pulmonary: normal non-labored breathing , without rales, rhonchi,  wheezing Cardiac: regular Abdomen: soft, NT, no masses Skin: without rashes Vascular Exam/Pulses: Brisk bilateral DP/PT Doppler signals Extremities: without ischemic changes, without gangrene , without cellulitis; without open wounds;  Musculoskeletal: no muscle wasting or atrophy  Neurologic: A&O X 3;  No focal weakness or paresthesias are  detected Psychiatric:  The pt has Normal affect.  Non-Invasive Vascular imaging   ABI (06/17/2024) R:  ABI: 0.58 (0.67),  PT: mono DP: mono TBI:  0.3 L:  ABI: 0.88 (0.89),  PT: mono DP: mono TBI: 0.43  Aortoiliac Duplex (06/17/2024) Patent common iliac artery stents.  50 to 99% stenosis of the distal left EIA with a PSV of 462 cm/s.  50 to 99% stenosis distal to the right common iliac artery stent with a PSV of 316 cm/s.  Carotid duplex (06/17/2024) 60 to 79% stenosis of the right internal carotid artery.  1 to 39% stenosis of the left internal carotid artery.  LLE BPG Duplex (06/17/2024) Patent left common femoral to below-knee popliteal artery bypass without stenosis  Medical Decision Making   Marquail Bradwell. is a 78 y.o. male who presents for follow up  Based on the patient's vascular studies, his ABIs on the right are slightly decreased from 0.67 to 0.58.  His ABIs in the left are essentially unchanged at 0.88 Left lower extremity bypass graft duplex demonstrates a patent femoropopliteal bypass without stenosis.  There appeared to be high-grade stenosis at the inflow of his bypass 6 months ago, however this is not seen on duplex today Aortoiliac duplex demonstrates patent common iliac artery stents without stenosis.  There is essentially unchanged stenosis of the distal left EIA and distal to the right common iliac artery stent.  This has been seen for the last 2 visits Carotid duplex demonstrates unchanged 60 to 79% stenosis of the right ICA.  Stenosis of the left ICA appears to be borderline between 1 to 39% and 40 to 59% On exam he is neurologically intact.  He has palpable femoral pulses.  He has brisk DP/PT Doppler signals bilaterally He denies any strokelike symptoms.  He also denies any rest pain, claudication, or tissue loss Given that the stenosis distal to his common iliac artery stents is unchanged and his bypass is patent, we will continue to observe the patient closely  rather than pursue intervention.  He can follow-up with our office in 6 months with repeat carotid duplex, ABIs, LLE bypass graft duplex, and aortoiliac duplex   Ahmed Holster PA-C [facility] [place] Office: [phone]  Call FI:[email or web address]

## 2024-06-23 DIAGNOSIS — Z992 Dependence on renal dialysis: Secondary | ICD-10-CM | POA: Diagnosis not present

## 2024-06-23 DIAGNOSIS — N2581 Secondary hyperparathyroidism of renal origin: Secondary | ICD-10-CM | POA: Diagnosis not present

## 2024-06-23 DIAGNOSIS — N186 End stage renal disease: Secondary | ICD-10-CM | POA: Diagnosis not present

## 2024-06-25 DIAGNOSIS — Z992 Dependence on renal dialysis: Secondary | ICD-10-CM | POA: Diagnosis not present

## 2024-06-25 DIAGNOSIS — N186 End stage renal disease: Secondary | ICD-10-CM | POA: Diagnosis not present

## 2024-06-25 DIAGNOSIS — N2581 Secondary hyperparathyroidism of renal origin: Secondary | ICD-10-CM | POA: Diagnosis not present

## 2024-06-27 DIAGNOSIS — Z992 Dependence on renal dialysis: Secondary | ICD-10-CM | POA: Diagnosis not present

## 2024-06-27 DIAGNOSIS — N186 End stage renal disease: Secondary | ICD-10-CM | POA: Diagnosis not present

## 2024-06-27 DIAGNOSIS — N2581 Secondary hyperparathyroidism of renal origin: Secondary | ICD-10-CM | POA: Diagnosis not present

## 2024-06-30 DIAGNOSIS — Z992 Dependence on renal dialysis: Secondary | ICD-10-CM | POA: Diagnosis not present

## 2024-06-30 DIAGNOSIS — N186 End stage renal disease: Secondary | ICD-10-CM | POA: Diagnosis not present

## 2024-06-30 DIAGNOSIS — N2581 Secondary hyperparathyroidism of renal origin: Secondary | ICD-10-CM | POA: Diagnosis not present

## 2024-07-02 DIAGNOSIS — N2581 Secondary hyperparathyroidism of renal origin: Secondary | ICD-10-CM | POA: Diagnosis not present

## 2024-07-02 DIAGNOSIS — N186 End stage renal disease: Secondary | ICD-10-CM | POA: Diagnosis not present

## 2024-07-02 DIAGNOSIS — Z992 Dependence on renal dialysis: Secondary | ICD-10-CM | POA: Diagnosis not present

## 2024-07-04 DIAGNOSIS — N186 End stage renal disease: Secondary | ICD-10-CM | POA: Diagnosis not present

## 2024-07-04 DIAGNOSIS — Z992 Dependence on renal dialysis: Secondary | ICD-10-CM | POA: Diagnosis not present

## 2024-07-04 DIAGNOSIS — N2581 Secondary hyperparathyroidism of renal origin: Secondary | ICD-10-CM | POA: Diagnosis not present

## 2024-07-06 ENCOUNTER — Ambulatory Visit: Attending: Surgery | Admitting: Physician Assistant

## 2024-07-06 VITALS — BP 101/56 | HR 71 | Temp 97.9°F | Wt 186.0 lb

## 2024-07-06 DIAGNOSIS — Z992 Dependence on renal dialysis: Secondary | ICD-10-CM

## 2024-07-06 DIAGNOSIS — N186 End stage renal disease: Secondary | ICD-10-CM

## 2024-07-06 NOTE — Progress Notes (Signed)
 POST OPERATIVE OFFICE NOTE    CC:  F/u for surgery  HPI:  This is a 78 y.o. male who is s/p revision of left arm AVF (elevation and branch ligation) on 06/10/2024 by Dr. Serene.  He was seen on 06/17/2024 in our office for PAD and carotid stenosis as he has the following hx: 1) left carotid endarterectomy in 2011 by Dr. Harvey 2) bilateral common iliac artery stenting in July 2016 by Dr. Harvey 3) left femoral to above-knee popliteal artery bypass with PTFE in September 2016 by Dr. Harvey 4) left femoral to above-knee popliteal artery bypass in March 2020 by Dr. Harvey  He was doing well without claudication or rest pain or neurological sx. He had a patent LLE BPG without stenosis. He had right ICA stenosis of 60-79% and left 1-39% stenosis.  He had elevated velocities in the left EIA distal tot he CIA stent that had been present for previous 2 visits. He was scheduled for 6 month follow up for repeat studies.   Pt states he does not have pain/numbness in the left hand.    The pt is on dialysis T/T/S at [facility] location.   Allergies  Allergen Reactions   Lopressor  [Metoprolol  Tartrate] Other (See Comments)    Severe bradycardia    Current Outpatient Medications  Medication Sig Dispense Refill   aspirin  EC 81 MG tablet Take 81 mg by mouth daily. Swallow whole.     atorvastatin  (LIPITOR ) 80 MG tablet Take 80 mg by mouth at bedtime. (2100)     Febuxostat  80 MG TABS Take 80 mg by mouth daily.     gabapentin  (NEURONTIN ) 100 MG capsule Take 200 capsules by mouth at bedtime.     hydrALAZINE  (APRESOLINE ) 25 MG tablet Take 1 tablet (25 mg total) by mouth 2 (two) times daily. 60 tablet 0   HYDROcodone -acetaminophen  (NORCO/VICODIN) 5-325 MG tablet Take 1 tablet by mouth every 6 (six) hours as needed for moderate pain (pain score 4-6). 15 tablet 0   ipratropium-albuterol  (DUONEB) 0.5-2.5 (3) MG/3ML SOLN Take 3 mLs by nebulization every 6 (six) hours as needed. 360 mL 0   levothyroxine   (SYNTHROID ) 75 MCG tablet Take 75 mcg by mouth daily.     memantine  (NAMENDA ) 10 MG tablet Take 1 tablet by mouth 2 (two) times daily.     SENSIPAR 30 MG tablet Take 30 mg by mouth every Monday, Wednesday, and Friday.     sevelamer  carbonate (RENVELA ) 800 MG tablet Take 800 mg by mouth 3 (three) times daily.     No current facility-administered medications for this visit.     ROS:  See HPI  Physical Exam:  Today's Vitals   07/06/24 1343  BP: (!) 101/56  Pulse: 71  Temp: 97.9 F (36.6 C)  TempSrc: Temporal  Weight: 186 lb (84.4 kg)  PainSc: 0-No pain   Body mass index is 27.47 kg/m.   Incision:  well healed incisions; proximal incision still has small scab proximally Extremities:   There is a palpable left radial pulse.   Motor and sensory are in tact.   There is a thrill/bruit present.  Access is  easily palpable   Assessment/Plan:  This is a 78 y.o. male who is s/p: revision of left arm AVF (elevation and branch ligation) on 06/10/2024 by Dr. Serene   -the pt does not have evidence of steal. -pt's access can be used 07/20/2024 to give the proximal incision time to completely heal as it does have a scab at  the proximal edge. -if pt has tunneled catheter, this can be removed at the discretion of the dialysis center once the pt's access has been successfully cannulated to their satisfaction.  -discussed with pt that access does not last forever and will need intervention or even new access at some point.  -the pt will follow up with his regular surveillance appt.    Lucie Apt, [facility] [phone]  Clinic MD:  Gretta on call MD

## 2024-07-07 DIAGNOSIS — N186 End stage renal disease: Secondary | ICD-10-CM | POA: Diagnosis not present

## 2024-07-07 DIAGNOSIS — N2581 Secondary hyperparathyroidism of renal origin: Secondary | ICD-10-CM | POA: Diagnosis not present

## 2024-07-07 DIAGNOSIS — Z992 Dependence on renal dialysis: Secondary | ICD-10-CM | POA: Diagnosis not present

## 2024-07-09 DIAGNOSIS — N2581 Secondary hyperparathyroidism of renal origin: Secondary | ICD-10-CM | POA: Diagnosis not present

## 2024-07-09 DIAGNOSIS — N186 End stage renal disease: Secondary | ICD-10-CM | POA: Diagnosis not present

## 2024-07-09 DIAGNOSIS — Z992 Dependence on renal dialysis: Secondary | ICD-10-CM | POA: Diagnosis not present

## 2024-07-11 DIAGNOSIS — N2581 Secondary hyperparathyroidism of renal origin: Secondary | ICD-10-CM | POA: Diagnosis not present

## 2024-07-11 DIAGNOSIS — Z992 Dependence on renal dialysis: Secondary | ICD-10-CM | POA: Diagnosis not present

## 2024-07-11 DIAGNOSIS — N186 End stage renal disease: Secondary | ICD-10-CM | POA: Diagnosis not present

## 2024-07-14 DIAGNOSIS — Z992 Dependence on renal dialysis: Secondary | ICD-10-CM | POA: Diagnosis not present

## 2024-07-14 DIAGNOSIS — N2581 Secondary hyperparathyroidism of renal origin: Secondary | ICD-10-CM | POA: Diagnosis not present

## 2024-07-14 DIAGNOSIS — N186 End stage renal disease: Secondary | ICD-10-CM | POA: Diagnosis not present

## 2024-07-16 DIAGNOSIS — Z992 Dependence on renal dialysis: Secondary | ICD-10-CM | POA: Diagnosis not present

## 2024-07-16 DIAGNOSIS — N186 End stage renal disease: Secondary | ICD-10-CM | POA: Diagnosis not present

## 2024-07-16 DIAGNOSIS — E1122 Type 2 diabetes mellitus with diabetic chronic kidney disease: Secondary | ICD-10-CM | POA: Diagnosis not present

## 2024-07-16 DIAGNOSIS — N2581 Secondary hyperparathyroidism of renal origin: Secondary | ICD-10-CM | POA: Diagnosis not present

## 2024-07-18 DIAGNOSIS — J189 Pneumonia, unspecified organism: Secondary | ICD-10-CM | POA: Diagnosis not present

## 2024-07-18 DIAGNOSIS — N2581 Secondary hyperparathyroidism of renal origin: Secondary | ICD-10-CM | POA: Diagnosis not present

## 2024-07-18 DIAGNOSIS — N186 End stage renal disease: Secondary | ICD-10-CM | POA: Diagnosis not present

## 2024-07-18 DIAGNOSIS — Z992 Dependence on renal dialysis: Secondary | ICD-10-CM | POA: Diagnosis not present

## 2024-07-21 DIAGNOSIS — Z992 Dependence on renal dialysis: Secondary | ICD-10-CM | POA: Diagnosis not present

## 2024-07-21 DIAGNOSIS — N186 End stage renal disease: Secondary | ICD-10-CM | POA: Diagnosis not present

## 2024-07-21 DIAGNOSIS — N2581 Secondary hyperparathyroidism of renal origin: Secondary | ICD-10-CM | POA: Diagnosis not present

## 2024-07-23 DIAGNOSIS — N2581 Secondary hyperparathyroidism of renal origin: Secondary | ICD-10-CM | POA: Diagnosis not present

## 2024-07-23 DIAGNOSIS — Z992 Dependence on renal dialysis: Secondary | ICD-10-CM | POA: Diagnosis not present

## 2024-07-23 DIAGNOSIS — N186 End stage renal disease: Secondary | ICD-10-CM | POA: Diagnosis not present

## 2024-07-25 DIAGNOSIS — N2581 Secondary hyperparathyroidism of renal origin: Secondary | ICD-10-CM | POA: Diagnosis not present

## 2024-07-25 DIAGNOSIS — Z992 Dependence on renal dialysis: Secondary | ICD-10-CM | POA: Diagnosis not present

## 2024-07-25 DIAGNOSIS — N186 End stage renal disease: Secondary | ICD-10-CM | POA: Diagnosis not present

## 2024-07-27 ENCOUNTER — Telehealth (HOSPITAL_COMMUNITY): Payer: Self-pay | Admitting: *Deleted

## 2024-07-27 NOTE — Telephone Encounter (Signed)
 Received fax from Dr Manuelita Barters requesting BAM procedure for non-maturing AVF.  Spoke with [facility] in office today and would like patient to be scheduled for a fistulogram.  Will notify Alan and scan fax into media tab.

## 2024-07-28 DIAGNOSIS — N186 End stage renal disease: Secondary | ICD-10-CM | POA: Diagnosis not present

## 2024-07-28 DIAGNOSIS — Z992 Dependence on renal dialysis: Secondary | ICD-10-CM | POA: Diagnosis not present

## 2024-07-28 DIAGNOSIS — N2581 Secondary hyperparathyroidism of renal origin: Secondary | ICD-10-CM | POA: Diagnosis not present

## 2024-07-30 DIAGNOSIS — N186 End stage renal disease: Secondary | ICD-10-CM | POA: Diagnosis not present

## 2024-07-30 DIAGNOSIS — N2581 Secondary hyperparathyroidism of renal origin: Secondary | ICD-10-CM | POA: Diagnosis not present

## 2024-07-30 DIAGNOSIS — Z992 Dependence on renal dialysis: Secondary | ICD-10-CM | POA: Diagnosis not present

## 2024-08-01 DIAGNOSIS — N2581 Secondary hyperparathyroidism of renal origin: Secondary | ICD-10-CM | POA: Diagnosis not present

## 2024-08-01 DIAGNOSIS — Z992 Dependence on renal dialysis: Secondary | ICD-10-CM | POA: Diagnosis not present

## 2024-08-01 DIAGNOSIS — N186 End stage renal disease: Secondary | ICD-10-CM | POA: Diagnosis not present

## 2024-08-04 DIAGNOSIS — N2581 Secondary hyperparathyroidism of renal origin: Secondary | ICD-10-CM | POA: Diagnosis not present

## 2024-08-04 DIAGNOSIS — N186 End stage renal disease: Secondary | ICD-10-CM | POA: Diagnosis not present

## 2024-08-04 DIAGNOSIS — Z992 Dependence on renal dialysis: Secondary | ICD-10-CM | POA: Diagnosis not present

## 2024-08-05 ENCOUNTER — Ambulatory Visit (HOSPITAL_COMMUNITY)
Admission: RE | Admit: 2024-08-05 | Discharge: 2024-08-05 | Disposition: A | Discharge status: Home / Self Care | Attending: Vascular Surgery | Admitting: Vascular Surgery

## 2024-08-05 ENCOUNTER — Other Ambulatory Visit: Payer: Self-pay

## 2024-08-05 ENCOUNTER — Encounter (HOSPITAL_COMMUNITY)
Admission: RE | Disposition: A | Discharge status: Home / Self Care | Payer: Self-pay | Source: Home / Self Care | Attending: Vascular Surgery

## 2024-08-05 DIAGNOSIS — F17210 Nicotine dependence, cigarettes, uncomplicated: Secondary | ICD-10-CM | POA: Insufficient documentation

## 2024-08-05 DIAGNOSIS — T82858A Stenosis of vascular prosthetic devices, implants and grafts, initial encounter: Secondary | ICD-10-CM | POA: Diagnosis not present

## 2024-08-05 DIAGNOSIS — I120 Hypertensive chronic kidney disease with stage 5 chronic kidney disease or end stage renal disease: Secondary | ICD-10-CM | POA: Diagnosis not present

## 2024-08-05 DIAGNOSIS — T82898A Other specified complication of vascular prosthetic devices, implants and grafts, initial encounter: Secondary | ICD-10-CM

## 2024-08-05 DIAGNOSIS — N186 End stage renal disease: Secondary | ICD-10-CM

## 2024-08-05 DIAGNOSIS — Y832 Surgical operation with anastomosis, bypass or graft as the cause of abnormal reaction of the patient, or of later complication, without mention of misadventure at the time of the procedure: Secondary | ICD-10-CM | POA: Insufficient documentation

## 2024-08-05 DIAGNOSIS — E1122 Type 2 diabetes mellitus with diabetic chronic kidney disease: Secondary | ICD-10-CM | POA: Insufficient documentation

## 2024-08-05 DIAGNOSIS — Z992 Dependence on renal dialysis: Secondary | ICD-10-CM

## 2024-08-05 HISTORY — PX: A/V SHUNT INTERVENTION: CATH118220

## 2024-08-05 HISTORY — PX: VENOUS ANGIOPLASTY: CATH118376

## 2024-08-05 SURGERY — A/V SHUNT INTERVENTION
Anesthesia: LOCAL | Site: Arm Upper | Laterality: Left

## 2024-08-05 MED ORDER — LIDOCAINE HCL (PF) 1 % IJ SOLN
INTRAMUSCULAR | Status: DC | PRN
  Administered 2024-08-05: 5 mL via INTRADERMAL

## 2024-08-05 MED ORDER — IODIXANOL 320 MG/ML IV SOLN
INTRAVENOUS | Status: DC | PRN
  Administered 2024-08-05: 35 mL

## 2024-08-05 MED ORDER — HEPARIN (PORCINE) IN NACL 1000-0.9 UT/500ML-% IV SOLN
INTRAVENOUS | Status: DC | PRN
  Administered 2024-08-05: 500 mL

## 2024-08-05 MED ORDER — LIDOCAINE HCL (PF) 1 % IJ SOLN
INTRAMUSCULAR | Status: AC
Start: 2024-08-05 — End: 2024-08-05
  Filled 2024-08-05: qty 30

## 2024-08-05 SURGICAL SUPPLY — 10 items
BALLOON MUSTANG 6X80X75 (BALLOONS) IMPLANT
GUIDEWIRE ANGLED .035 180CM (WIRE) IMPLANT
KIT ENCORE 26 ADVANTAGE (KITS) IMPLANT
KIT MICROPUNCTURE NIT STIFF (SHEATH) IMPLANT
KIT PV (KITS) ×2 IMPLANT
SHEATH PINNACLE R/O II 6F 4CM (SHEATH) IMPLANT
SHEATH PROBE COVER 6X72 (BAG) IMPLANT
TRAY PV CATH (CUSTOM PROCEDURE TRAY) ×2 IMPLANT
TUBING CIL FLEX 10 FLL-RA (TUBING) IMPLANT
WIRE TORQFLEX AUST .018X40CM (WIRE) IMPLANT

## 2024-08-05 NOTE — H&P (Signed)
 HD ACCESS CENTER H&P   Patient ID: Joseph Luse., male   DOB: October 20, 1946, 78 y.o.   MRN: [id number]  Subjective:     HPI Joseph Hebert. is a 78 y.o. male with ESRD presenting to the HD access center for intervention.  Past Medical History:  Diagnosis Date   Acute encephalopathy    Acute hypoxemic respiratory failure (HCC)    Acute pulmonary edema (HCC)    Acute respiratory failure with hypoxia (HCC)    Anemia    Arrhythmia    PVCs; takes metoprolol  daily   Arthritis    Asthma    Bilateral leg pain 06/16/2015   CAD (coronary artery disease)    minimal, nonobstructive   Cardiac asystole (HCC)    after back surgery in 2018   Cardiogenic shock (HCC)    Carotid artery occlusion    s/p CEA in 2011   Chronic back pain    s/p lumbar fusion   COPD (chronic obstructive pulmonary disease) (HCC)    Spirva daily and Albuterol  as needed   Dementia (HCC)    Dementia (HCC)    Depression    Diabetes mellitus without complication (HCC)    Type 2 diet controlled.Never been on meds, does not check blood sugar   ESRD (end stage renal disease) (HCC)    t-th-sat dialysis   GERD (gastroesophageal reflux disease)    Gout    takes Uloric  daily   HCAP (healthcare-associated pneumonia)    Head injury    as a child   History of colon polyps    benign   History of gastric ulcer    History of hiatal hernia    Hyperlipidemia    takes Atorvastatin  and Fenofibrate  daily   Hypertension    takes Lisinopril ,Amlodipine ,and Hydralazine  daily   Intermittent claudication (HCC) 07/18/2015   s/p LLE bypass   Lumbosacral spondylosis with radiculopathy 02/17/2016   Nevus of choroid of left eye 12/08/2014   Numbness    lower left leg and upper right thigh   PAD (peripheral artery disease) (HCC)    Pneumonia    hx of-couple of yrs ago   S/P arteriovenous (AV) fistula creation - Left UE. 02-14-2024. 02/14/2024   Serous retinal detachment of left eye 12/08/2014   Shortness of breath dyspnea     with exertion   Status post intraocular lens implant 12/08/2014   Tuberculosis 2006    9 months    Vertigo    Wears glasses    Family History  Problem Relation Age of Onset   Diabetes Mother    Hyperlipidemia Father    Hypertension Father    Other Father        Right Leg Amputation   Heart disease Sister        Aneyrism    Past Surgical History:  Procedure Laterality Date   A/V SHUNT INTERVENTION N/A 05/15/2024   Procedure: A/V SHUNT INTERVENTION;  Surgeon: Serene Gaile LELON, MD;  Location: [facility];  Service: Cardiovascular;  Laterality: N/A;   ABDOMINAL AORTOGRAM W/LOWER EXTREMITY N/A 02/20/2019   Procedure: ABDOMINAL AORTOGRAM W/LOWER EXTREMITY;  Surgeon: Harvey Carlin BRAVO, MD;  Location: [facility];  Service: Cardiovascular;  Laterality: N/A;   ABDOMINAL EXPOSURE N/A 01/22/2017   Procedure: ABDOMINAL EXPOSURE;  Surgeon: Penne Lonni [place], MD;  Location: [facility] OR;  Service: Vascular;  Laterality: N/A;   ANTERIOR LAT LUMBAR FUSION N/A 01/22/2017   Procedure: LUMBAR THREE-FOUR, LUMBAR FOUR-FIVE ANTERIOR LATERAL INTERBODY FUSION;  Surgeon: Morene Hicks Ditty, MD;  Location: [facility] OR;  Service: Neurosurgery;  Laterality: N/A;  L3-4 L4-5 Lateral interbody fusion   ANTERIOR LUMBAR FUSION N/A 01/22/2017   Procedure: LUMBAR FIVE-SACRUM ONE ANTERIOR LUMBAR INTERBODY FUSION WITH ABDOMINAL APPROACH BY DR SHEREE;  Surgeon: Morene Hicks Ditty, MD;  Location: [facility] OR;  Service: Neurosurgery;  Laterality: N/A;   AV FISTULA PLACEMENT Left 02/14/2024   Procedure: LEFT ARM BRACHIOCEPHALIC ARTERIOVENOUS FISTULA CREATION;  Surgeon: SHEREE Penne Bruckner, MD;  Location: [facility] OR;  Service: Vascular;  Laterality: Left;   CAROTID BODY TUMOR EXCISION Left 09/21/2015   Procedure: EXCISE LEFT NECK NODULE WITH LOCAL ;  Surgeon: Carlin FORBES Haddock, MD;  Location: [facility] OR;  Service: Vascular;  Laterality: Left;   CAROTID ENDARTERECTOMY  10/31/2010   LEFT cea   cataract surgery Bilateral     COLONOSCOPY  10/1997   in [facility]   EYE SURGERY     FEMORAL-POPLITEAL BYPASS GRAFT Left 08/23/2015   Procedure: LEFT FEMORAL-POPLITEAL ARTERY BYPASS WITH GORETEX GRAFT;  Surgeon: Carlin FORBES Haddock, MD;  Location: [facility] OR;  Service: Vascular;  Laterality: Left;   FEMORAL-POPLITEAL BYPASS GRAFT Left 02/23/2019   Procedure: REDO LEFT FEMORAL TO POPLITEAL ARTERY BYPASS GRAFT Using Propaten graft;  Surgeon: Haddock Carlin FORBES, MD;  Location: [facility] OR;  Service: Vascular;  Laterality: Left;   FISTULA SUPERFICIALIZATION Left 06/10/2024   Procedure: FISTULA SUPERFICIALIZATION;  Surgeon: Serene Gaile ORN, MD;  Location: [facility] OR;  Service: Vascular;  Laterality: Left;   IR FLUORO GUIDE CV LINE RIGHT  02/12/2024   IR THORACENTESIS ASP PLEURAL SPACE W/IMG GUIDE  02/13/2024   IR US  GUIDE VASC ACCESS RIGHT  02/12/2024   JOINT REPLACEMENT  1980   RIGHT  knee   LIGATION OF COMPETING BRANCHES OF ARTERIOVENOUS FISTULA Left 06/10/2024   Procedure: LIGATION OF COMPETING BRANCHES OF ARTERIOVENOUS FISTULA;  Surgeon: Serene Gaile ORN, MD;  Location: [facility] OR;  Service: Vascular;  Laterality: Left;   LUMBAR EPIDURAL INJECTION     LUMBAR LAMINECTOMY/DECOMPRESSION MICRODISCECTOMY Right 02/17/2016   Procedure: Laminectomy and Foraminotomy - Lumbar four-five right;  Surgeon: Morene Hicks Ditty, MD;  Location: [facility]O ORS;  Service: Neurosurgery;  Laterality: Right;  right   LUMBAR PERCUTANEOUS PEDICLE SCREW 3 LEVEL N/A 01/22/2017   Procedure: LUMBAR THREE-SACRAL ONE PERCUTANEOUS PEDICLE SCREW FIXATION WITH ROBOTIC ASSISTANCE;  Surgeon: Morene Hicks Ditty, MD;  Location: [facility] OR;  Service: Neurosurgery;  Laterality: N/A;  L3 to S1 Percutaneous pedicle screw fixation   PERIPHERAL VASCULAR CATHETERIZATION N/A 06/24/2015   Procedure: Abdominal Aortogram;  Surgeon: Carlin FORBES Haddock, MD;  Location: [facility] INVASIVE CV LAB;  Service: Cardiovascular;  Laterality: N/A;   Stents  08/08/1998   Bilateral iliofemoral  stents, [facility]   TONSILLECTOMY      VENOUS ANGIOPLASTY  05/15/2024   Procedure: VENOUS ANGIOPLASTY;  Surgeon: Serene Gaile ORN, MD;  Location: [facility];  Service: Cardiovascular;;    Short Social History:  Social History   Tobacco Use   Smoking status: Every Day    Current packs/day: 1.00    Types: Cigarettes   Smokeless tobacco: Never   Tobacco comments:    1ppd  Substance Use Topics   Alcohol  use: No    Alcohol /week: 0.0 standard drinks of alcohol     Allergies  Allergen Reactions   Lopressor  [Metoprolol  Tartrate] Other (See Comments)    Severe bradycardia    No current facility-administered medications for this encounter.    REVIEW OF SYSTEMS All other systems were reviewed  and are negative     Objective:   Objective   Vitals:   08/05/24 0949 08/05/24 0955  BP: (!) 114/57 (!) 114/57  Pulse: (!) 51 62  Resp: 12 16  Temp: 97.9 F (36.6 C)   SpO2: 97% 99%   There is no height or weight on file to calculate BMI.  Physical Exam General: no acute distress Cardiac: hemodynamically stable Extremities: Palpable pulse in left arm AV fistula  Data: Reviewed operative notes from Dr. Serene. He ballooned a cephalic vein stenosis with a 5 x 40 balloon in May He then had multiple sidebranches ligated on June 25.     Assessment/Plan:   Joseph Hebert. is a 78 y.o. male with ESRD presenting for fistulogram for balloon assisted maturation.  Having issues with maturation. Reviewed risks and benefits of fistulogram with intervention and patient agreed to proceed.   Norman Serve, MD [facility]

## 2024-08-05 NOTE — Op Note (Signed)
    Patient name: Joseph Hebert. MRN: [id number] DOB: 1946-07-23 Sex: male  08/05/2024 Pre-operative Diagnosis: ESRD on HD Post-operative diagnosis:  Same Surgeon:  Norman GORMAN Serve, MD Procedure Performed:  Ultrasound-guided access of left arm brachiocephalic fistula Fistulogram and central venogram Balloon angioplasty of cephalic vein fistula, 6 mm x 80 mm Mustang  Indications: [name] is a 78 year old male with ESRD on HD presenting to the HD access center for fistulogram for balloon assisted maturation.  He has been having issues with maturation and has been dialyzing through a right IJ TDC.  He recently underwent fistulogram in May with balloon angioplasty of his cephalic vein fistula as well as ligation of branches in June.  Risks and benefits of fistulogram with intervention were reviewed and he elected to proceed.  Findings:  Widely patent central venous system.  The cephalic arch appears patent although small.  The upper arm cephalic vein has a severe 70% stenosis.  The proximal cephalic vein fistula also has a severe 70% stenosis.  The anastomosis is widely patent.   Procedure:  The patient was identified in the holding area and taken to the cath lab  The patient was then placed supine on the table and prepped and draped in the usual sterile fashion.  A time out was called.  Ultrasound was used to evaluate the left arm AV access. This was accessed under u/s guidance. An 018 wire was advanced without resistance, a micropuncture sheath was placed and fistulagram obtained which demonstrated the above findings.  This access was then upsized to a 6 [place] short sheath over a glidewire.  The proximal and distal cephalic vein fistula stenoses were treated with a 6 mm x 80 mm Mustang balloon.  There appeared to be stagnant flow but improvement in the stenosis, there was also some slight extravasation in the area ballooned near the [facility] fossa.  The balloon was replaced and the cephalic arch was  reballoon to nominal.  The balloon was then exchanged back and the area of extra was ballooned at low pressure, 5 atm for 2 minutes.  The wire and sheath were then removed and the access was managed with a 4-0 Monocryl figure-of-eight suture for hemostasis.  Contrast: 25 cc   Impression: Small but slightly improved luminal size of the left arm brachiocephalic fistula.  Improvement in thrill on completion. Encouraged to continue with arm exercises as potential full maturation is still questionable.   Norman GORMAN Serve MD [facility] [place] Office: [phone]

## 2024-08-06 ENCOUNTER — Encounter (HOSPITAL_COMMUNITY): Payer: Self-pay | Admitting: Vascular Surgery

## 2024-08-06 DIAGNOSIS — N186 End stage renal disease: Secondary | ICD-10-CM | POA: Diagnosis not present

## 2024-08-06 DIAGNOSIS — N2581 Secondary hyperparathyroidism of renal origin: Secondary | ICD-10-CM | POA: Diagnosis not present

## 2024-08-06 DIAGNOSIS — Z992 Dependence on renal dialysis: Secondary | ICD-10-CM | POA: Diagnosis not present

## 2024-08-08 DIAGNOSIS — Z992 Dependence on renal dialysis: Secondary | ICD-10-CM | POA: Diagnosis not present

## 2024-08-08 DIAGNOSIS — N186 End stage renal disease: Secondary | ICD-10-CM | POA: Diagnosis not present

## 2024-08-08 DIAGNOSIS — N2581 Secondary hyperparathyroidism of renal origin: Secondary | ICD-10-CM | POA: Diagnosis not present

## 2024-08-11 DIAGNOSIS — N186 End stage renal disease: Secondary | ICD-10-CM | POA: Diagnosis not present

## 2024-08-11 DIAGNOSIS — Z992 Dependence on renal dialysis: Secondary | ICD-10-CM | POA: Diagnosis not present

## 2024-08-11 DIAGNOSIS — N2581 Secondary hyperparathyroidism of renal origin: Secondary | ICD-10-CM | POA: Diagnosis not present

## 2024-08-13 DIAGNOSIS — N2581 Secondary hyperparathyroidism of renal origin: Secondary | ICD-10-CM | POA: Diagnosis not present

## 2024-08-13 DIAGNOSIS — Z992 Dependence on renal dialysis: Secondary | ICD-10-CM | POA: Diagnosis not present

## 2024-08-13 DIAGNOSIS — N186 End stage renal disease: Secondary | ICD-10-CM | POA: Diagnosis not present

## 2024-08-15 DIAGNOSIS — N2581 Secondary hyperparathyroidism of renal origin: Secondary | ICD-10-CM | POA: Diagnosis not present

## 2024-08-15 DIAGNOSIS — Z992 Dependence on renal dialysis: Secondary | ICD-10-CM | POA: Diagnosis not present

## 2024-08-15 DIAGNOSIS — N186 End stage renal disease: Secondary | ICD-10-CM | POA: Diagnosis not present

## 2024-08-16 DIAGNOSIS — Z992 Dependence on renal dialysis: Secondary | ICD-10-CM | POA: Diagnosis not present

## 2024-08-16 DIAGNOSIS — N186 End stage renal disease: Secondary | ICD-10-CM | POA: Diagnosis not present

## 2024-08-16 DIAGNOSIS — E1122 Type 2 diabetes mellitus with diabetic chronic kidney disease: Secondary | ICD-10-CM | POA: Diagnosis not present

## 2024-08-18 DIAGNOSIS — I953 Hypotension of hemodialysis: Secondary | ICD-10-CM | POA: Diagnosis not present

## 2024-08-18 DIAGNOSIS — I499 Cardiac arrhythmia, unspecified: Secondary | ICD-10-CM | POA: Diagnosis not present

## 2024-08-18 DIAGNOSIS — E1122 Type 2 diabetes mellitus with diabetic chronic kidney disease: Secondary | ICD-10-CM | POA: Diagnosis not present

## 2024-08-18 DIAGNOSIS — N186 End stage renal disease: Secondary | ICD-10-CM | POA: Diagnosis not present

## 2024-08-18 DIAGNOSIS — I959 Hypotension, unspecified: Secondary | ICD-10-CM | POA: Diagnosis not present

## 2024-08-18 DIAGNOSIS — N2581 Secondary hyperparathyroidism of renal origin: Secondary | ICD-10-CM | POA: Diagnosis not present

## 2024-08-18 DIAGNOSIS — I120 Hypertensive chronic kidney disease with stage 5 chronic kidney disease or end stage renal disease: Secondary | ICD-10-CM | POA: Diagnosis not present

## 2024-08-18 DIAGNOSIS — R531 Weakness: Secondary | ICD-10-CM | POA: Diagnosis not present

## 2024-08-18 DIAGNOSIS — Z79899 Other long term (current) drug therapy: Secondary | ICD-10-CM | POA: Diagnosis not present

## 2024-08-18 DIAGNOSIS — Z992 Dependence on renal dialysis: Secondary | ICD-10-CM | POA: Diagnosis not present

## 2024-08-20 DIAGNOSIS — Z992 Dependence on renal dialysis: Secondary | ICD-10-CM | POA: Diagnosis not present

## 2024-08-20 DIAGNOSIS — N2581 Secondary hyperparathyroidism of renal origin: Secondary | ICD-10-CM | POA: Diagnosis not present

## 2024-08-20 DIAGNOSIS — N186 End stage renal disease: Secondary | ICD-10-CM | POA: Diagnosis not present

## 2024-08-22 DIAGNOSIS — N2581 Secondary hyperparathyroidism of renal origin: Secondary | ICD-10-CM | POA: Diagnosis not present

## 2024-08-22 DIAGNOSIS — Z992 Dependence on renal dialysis: Secondary | ICD-10-CM | POA: Diagnosis not present

## 2024-08-22 DIAGNOSIS — N186 End stage renal disease: Secondary | ICD-10-CM | POA: Diagnosis not present

## 2024-08-25 DIAGNOSIS — N186 End stage renal disease: Secondary | ICD-10-CM | POA: Diagnosis not present

## 2024-08-25 DIAGNOSIS — Z992 Dependence on renal dialysis: Secondary | ICD-10-CM | POA: Diagnosis not present

## 2024-08-25 DIAGNOSIS — N2581 Secondary hyperparathyroidism of renal origin: Secondary | ICD-10-CM | POA: Diagnosis not present

## 2024-08-27 DIAGNOSIS — N2581 Secondary hyperparathyroidism of renal origin: Secondary | ICD-10-CM | POA: Diagnosis not present

## 2024-08-27 DIAGNOSIS — Z992 Dependence on renal dialysis: Secondary | ICD-10-CM | POA: Diagnosis not present

## 2024-08-27 DIAGNOSIS — N186 End stage renal disease: Secondary | ICD-10-CM | POA: Diagnosis not present

## 2024-08-28 DIAGNOSIS — Z992 Dependence on renal dialysis: Secondary | ICD-10-CM | POA: Diagnosis not present

## 2024-08-28 DIAGNOSIS — I120 Hypertensive chronic kidney disease with stage 5 chronic kidney disease or end stage renal disease: Secondary | ICD-10-CM | POA: Diagnosis not present

## 2024-08-28 DIAGNOSIS — D631 Anemia in chronic kidney disease: Secondary | ICD-10-CM | POA: Diagnosis not present

## 2024-08-28 DIAGNOSIS — E1122 Type 2 diabetes mellitus with diabetic chronic kidney disease: Secondary | ICD-10-CM | POA: Diagnosis not present

## 2024-08-28 DIAGNOSIS — N186 End stage renal disease: Secondary | ICD-10-CM | POA: Diagnosis not present

## 2024-08-28 DIAGNOSIS — E785 Hyperlipidemia, unspecified: Secondary | ICD-10-CM | POA: Diagnosis not present

## 2024-08-28 DIAGNOSIS — F17200 Nicotine dependence, unspecified, uncomplicated: Secondary | ICD-10-CM | POA: Diagnosis not present

## 2024-08-28 DIAGNOSIS — Z125 Encounter for screening for malignant neoplasm of prostate: Secondary | ICD-10-CM | POA: Diagnosis not present

## 2024-08-28 DIAGNOSIS — M109 Gout, unspecified: Secondary | ICD-10-CM | POA: Diagnosis not present

## 2024-08-29 DIAGNOSIS — N186 End stage renal disease: Secondary | ICD-10-CM | POA: Diagnosis not present

## 2024-08-29 DIAGNOSIS — N2581 Secondary hyperparathyroidism of renal origin: Secondary | ICD-10-CM | POA: Diagnosis not present

## 2024-08-29 DIAGNOSIS — Z992 Dependence on renal dialysis: Secondary | ICD-10-CM | POA: Diagnosis not present

## 2024-09-01 DIAGNOSIS — Z992 Dependence on renal dialysis: Secondary | ICD-10-CM | POA: Diagnosis not present

## 2024-09-01 DIAGNOSIS — N186 End stage renal disease: Secondary | ICD-10-CM | POA: Diagnosis not present

## 2024-09-01 DIAGNOSIS — I120 Hypertensive chronic kidney disease with stage 5 chronic kidney disease or end stage renal disease: Secondary | ICD-10-CM | POA: Diagnosis not present

## 2024-09-01 DIAGNOSIS — Z7982 Long term (current) use of aspirin: Secondary | ICD-10-CM | POA: Diagnosis not present

## 2024-09-01 DIAGNOSIS — D500 Iron deficiency anemia secondary to blood loss (chronic): Secondary | ICD-10-CM | POA: Diagnosis not present

## 2024-09-01 DIAGNOSIS — Z79899 Other long term (current) drug therapy: Secondary | ICD-10-CM | POA: Diagnosis not present

## 2024-09-01 DIAGNOSIS — R58 Hemorrhage, not elsewhere classified: Secondary | ICD-10-CM | POA: Diagnosis not present

## 2024-09-01 DIAGNOSIS — N2581 Secondary hyperparathyroidism of renal origin: Secondary | ICD-10-CM | POA: Diagnosis not present

## 2024-09-01 DIAGNOSIS — E1122 Type 2 diabetes mellitus with diabetic chronic kidney disease: Secondary | ICD-10-CM | POA: Diagnosis not present

## 2024-09-01 DIAGNOSIS — Y838 Other surgical procedures as the cause of abnormal reaction of the patient, or of later complication, without mention of misadventure at the time of the procedure: Secondary | ICD-10-CM | POA: Diagnosis not present

## 2024-09-01 DIAGNOSIS — T82838A Hemorrhage of vascular prosthetic devices, implants and grafts, initial encounter: Secondary | ICD-10-CM | POA: Diagnosis not present

## 2024-09-03 DIAGNOSIS — N2581 Secondary hyperparathyroidism of renal origin: Secondary | ICD-10-CM | POA: Diagnosis not present

## 2024-09-03 DIAGNOSIS — N186 End stage renal disease: Secondary | ICD-10-CM | POA: Diagnosis not present

## 2024-09-03 DIAGNOSIS — Z992 Dependence on renal dialysis: Secondary | ICD-10-CM | POA: Diagnosis not present

## 2024-09-05 DIAGNOSIS — N2581 Secondary hyperparathyroidism of renal origin: Secondary | ICD-10-CM | POA: Diagnosis not present

## 2024-09-05 DIAGNOSIS — N186 End stage renal disease: Secondary | ICD-10-CM | POA: Diagnosis not present

## 2024-09-05 DIAGNOSIS — Z992 Dependence on renal dialysis: Secondary | ICD-10-CM | POA: Diagnosis not present

## 2024-09-08 DIAGNOSIS — Z992 Dependence on renal dialysis: Secondary | ICD-10-CM | POA: Diagnosis not present

## 2024-09-08 DIAGNOSIS — N186 End stage renal disease: Secondary | ICD-10-CM | POA: Diagnosis not present

## 2024-09-08 DIAGNOSIS — N2581 Secondary hyperparathyroidism of renal origin: Secondary | ICD-10-CM | POA: Diagnosis not present

## 2024-09-10 DIAGNOSIS — Z992 Dependence on renal dialysis: Secondary | ICD-10-CM | POA: Diagnosis not present

## 2024-09-10 DIAGNOSIS — N186 End stage renal disease: Secondary | ICD-10-CM | POA: Diagnosis not present

## 2024-09-10 DIAGNOSIS — N2581 Secondary hyperparathyroidism of renal origin: Secondary | ICD-10-CM | POA: Diagnosis not present

## 2024-09-12 DIAGNOSIS — N2581 Secondary hyperparathyroidism of renal origin: Secondary | ICD-10-CM | POA: Diagnosis not present

## 2024-09-12 DIAGNOSIS — Z992 Dependence on renal dialysis: Secondary | ICD-10-CM | POA: Diagnosis not present

## 2024-09-12 DIAGNOSIS — N186 End stage renal disease: Secondary | ICD-10-CM | POA: Diagnosis not present

## 2024-09-15 DIAGNOSIS — E1122 Type 2 diabetes mellitus with diabetic chronic kidney disease: Secondary | ICD-10-CM | POA: Diagnosis not present

## 2024-09-15 DIAGNOSIS — N186 End stage renal disease: Secondary | ICD-10-CM | POA: Diagnosis not present

## 2024-09-15 DIAGNOSIS — Z992 Dependence on renal dialysis: Secondary | ICD-10-CM | POA: Diagnosis not present

## 2024-09-15 DIAGNOSIS — N2581 Secondary hyperparathyroidism of renal origin: Secondary | ICD-10-CM | POA: Diagnosis not present

## 2024-09-17 DIAGNOSIS — N2581 Secondary hyperparathyroidism of renal origin: Secondary | ICD-10-CM | POA: Diagnosis not present

## 2024-09-17 DIAGNOSIS — Z992 Dependence on renal dialysis: Secondary | ICD-10-CM | POA: Diagnosis not present

## 2024-09-17 DIAGNOSIS — N186 End stage renal disease: Secondary | ICD-10-CM | POA: Diagnosis not present

## 2024-09-22 DIAGNOSIS — Z992 Dependence on renal dialysis: Secondary | ICD-10-CM | POA: Diagnosis not present

## 2024-09-24 DIAGNOSIS — N186 End stage renal disease: Secondary | ICD-10-CM | POA: Diagnosis not present

## 2024-09-26 DIAGNOSIS — Z992 Dependence on renal dialysis: Secondary | ICD-10-CM | POA: Diagnosis not present

## 2024-09-26 DIAGNOSIS — N2581 Secondary hyperparathyroidism of renal origin: Secondary | ICD-10-CM | POA: Diagnosis not present

## 2024-09-26 DIAGNOSIS — N186 End stage renal disease: Secondary | ICD-10-CM | POA: Diagnosis not present

## 2024-09-29 DIAGNOSIS — N186 End stage renal disease: Secondary | ICD-10-CM | POA: Diagnosis not present

## 2024-10-01 DIAGNOSIS — F0390 Unspecified dementia without behavioral disturbance: Secondary | ICD-10-CM | POA: Diagnosis not present

## 2024-10-01 DIAGNOSIS — R410 Disorientation, unspecified: Secondary | ICD-10-CM | POA: Diagnosis not present

## 2024-10-06 DIAGNOSIS — N186 End stage renal disease: Secondary | ICD-10-CM | POA: Diagnosis not present

## 2024-10-13 ENCOUNTER — Other Ambulatory Visit: Payer: Self-pay

## 2024-10-13 DIAGNOSIS — Z992 Dependence on renal dialysis: Secondary | ICD-10-CM

## 2024-10-13 DIAGNOSIS — N186 End stage renal disease: Secondary | ICD-10-CM | POA: Diagnosis not present

## 2024-10-15 DIAGNOSIS — N2581 Secondary hyperparathyroidism of renal origin: Secondary | ICD-10-CM | POA: Diagnosis not present

## 2024-10-16 DIAGNOSIS — E1122 Type 2 diabetes mellitus with diabetic chronic kidney disease: Secondary | ICD-10-CM | POA: Diagnosis not present

## 2024-10-16 DIAGNOSIS — Z992 Dependence on renal dialysis: Secondary | ICD-10-CM | POA: Diagnosis not present

## 2024-10-16 DIAGNOSIS — N186 End stage renal disease: Secondary | ICD-10-CM | POA: Diagnosis not present

## 2024-10-17 DIAGNOSIS — N2581 Secondary hyperparathyroidism of renal origin: Secondary | ICD-10-CM | POA: Diagnosis not present

## 2024-10-17 DIAGNOSIS — N186 End stage renal disease: Secondary | ICD-10-CM | POA: Diagnosis not present

## 2024-10-17 DIAGNOSIS — Z992 Dependence on renal dialysis: Secondary | ICD-10-CM | POA: Diagnosis not present

## 2024-10-24 ENCOUNTER — Observation Stay (HOSPITAL_COMMUNITY)
Admission: EM | Admit: 2024-10-24 | Discharge: 2024-10-26 | Disposition: A | Discharge status: Home Health | Attending: Infectious Diseases | Admitting: Infectious Diseases

## 2024-10-24 ENCOUNTER — Emergency Department (HOSPITAL_COMMUNITY)

## 2024-10-24 ENCOUNTER — Encounter (HOSPITAL_COMMUNITY): Payer: Self-pay | Admitting: Internal Medicine

## 2024-10-24 ENCOUNTER — Other Ambulatory Visit: Payer: Self-pay

## 2024-10-24 DIAGNOSIS — R001 Bradycardia, unspecified: Secondary | ICD-10-CM | POA: Diagnosis not present

## 2024-10-24 DIAGNOSIS — I491 Atrial premature depolarization: Secondary | ICD-10-CM | POA: Diagnosis not present

## 2024-10-24 DIAGNOSIS — T82590A Other mechanical complication of surgically created arteriovenous fistula, initial encounter: Secondary | ICD-10-CM

## 2024-10-24 DIAGNOSIS — R0789 Other chest pain: Secondary | ICD-10-CM | POA: Diagnosis not present

## 2024-10-24 DIAGNOSIS — I120 Hypertensive chronic kidney disease with stage 5 chronic kidney disease or end stage renal disease: Secondary | ICD-10-CM | POA: Insufficient documentation

## 2024-10-24 DIAGNOSIS — E1122 Type 2 diabetes mellitus with diabetic chronic kidney disease: Secondary | ICD-10-CM | POA: Insufficient documentation

## 2024-10-24 DIAGNOSIS — Z79899 Other long term (current) drug therapy: Secondary | ICD-10-CM | POA: Insufficient documentation

## 2024-10-24 DIAGNOSIS — I739 Peripheral vascular disease, unspecified: Secondary | ICD-10-CM | POA: Diagnosis present

## 2024-10-24 DIAGNOSIS — I4729 Other ventricular tachycardia: Secondary | ICD-10-CM | POA: Insufficient documentation

## 2024-10-24 DIAGNOSIS — E785 Hyperlipidemia, unspecified: Secondary | ICD-10-CM | POA: Diagnosis present

## 2024-10-24 DIAGNOSIS — E119 Type 2 diabetes mellitus without complications: Secondary | ICD-10-CM

## 2024-10-24 DIAGNOSIS — I499 Cardiac arrhythmia, unspecified: Secondary | ICD-10-CM | POA: Diagnosis not present

## 2024-10-24 DIAGNOSIS — E039 Hypothyroidism, unspecified: Secondary | ICD-10-CM | POA: Insufficient documentation

## 2024-10-24 DIAGNOSIS — I472 Ventricular tachycardia, unspecified: Secondary | ICD-10-CM

## 2024-10-24 DIAGNOSIS — R079 Chest pain, unspecified: Secondary | ICD-10-CM | POA: Diagnosis not present

## 2024-10-24 DIAGNOSIS — Z452 Encounter for adjustment and management of vascular access device: Secondary | ICD-10-CM | POA: Diagnosis not present

## 2024-10-24 DIAGNOSIS — Z96651 Presence of right artificial knee joint: Secondary | ICD-10-CM | POA: Insufficient documentation

## 2024-10-24 DIAGNOSIS — Z992 Dependence on renal dialysis: Secondary | ICD-10-CM | POA: Insufficient documentation

## 2024-10-24 DIAGNOSIS — Z9889 Other specified postprocedural states: Secondary | ICD-10-CM | POA: Diagnosis present

## 2024-10-24 DIAGNOSIS — I700 Atherosclerosis of aorta: Secondary | ICD-10-CM | POA: Diagnosis not present

## 2024-10-24 DIAGNOSIS — J449 Chronic obstructive pulmonary disease, unspecified: Secondary | ICD-10-CM | POA: Diagnosis present

## 2024-10-24 DIAGNOSIS — N186 End stage renal disease: Secondary | ICD-10-CM | POA: Insufficient documentation

## 2024-10-24 DIAGNOSIS — Z7982 Long term (current) use of aspirin: Secondary | ICD-10-CM | POA: Insufficient documentation

## 2024-10-24 HISTORY — DX: Ventricular tachycardia, unspecified: I47.20

## 2024-10-24 HISTORY — DX: Chest pain, unspecified: R07.9

## 2024-10-24 LAB — BASIC METABOLIC PANEL WITH GFR
Anion gap: 14 (ref 5–15)
BUN: 25 mg/dL — ABNORMAL HIGH (ref 8–23)
CO2: 24 mmol/L (ref 22–32)
Calcium: 8.2 mg/dL — ABNORMAL LOW (ref 8.9–10.3)
Chloride: 100 mmol/L (ref 98–111)
Creatinine, Ser: 3.96 mg/dL — ABNORMAL HIGH (ref 0.61–1.24)
GFR, Estimated: 15 mL/min — ABNORMAL LOW (ref >60)
Glucose, Bld: 133 mg/dL — ABNORMAL HIGH (ref 70–99)
Potassium: 3.8 mmol/L (ref 3.5–5.1)
Sodium: 138 mmol/L (ref 135–145)

## 2024-10-24 LAB — CBC WITH DIFFERENTIAL/PLATELET
Abs Immature Granulocytes: 0.02 K/uL (ref 0.00–0.07)
Basophils Absolute: 0.1 K/uL (ref 0.0–0.1)
Basophils Relative: 1 %
Eosinophils Absolute: 0.2 K/uL (ref 0.0–0.5)
Eosinophils Relative: 3 %
HCT: 37.3 % — ABNORMAL LOW (ref 39.0–52.0)
Hemoglobin: 12 g/dL — ABNORMAL LOW (ref 13.0–17.0)
Immature Granulocytes: 0 %
Lymphocytes Relative: 21 %
Lymphs Abs: 1.5 K/uL (ref 0.7–4.0)
MCH: 32.8 pg (ref 26.0–34.0)
MCHC: 32.2 g/dL (ref 30.0–36.0)
MCV: 101.9 fL — ABNORMAL HIGH (ref 80.0–100.0)
Monocytes Absolute: 0.7 K/uL (ref 0.1–1.0)
Monocytes Relative: 10 %
Neutro Abs: 4.6 K/uL (ref 1.7–7.7)
Neutrophils Relative %: 65 %
Platelets: 138 K/uL — ABNORMAL LOW (ref 150–400)
RBC: 3.66 MIL/uL — ABNORMAL LOW (ref 4.22–5.81)
RDW: 13.3 % (ref 11.5–15.5)
WBC: 7.1 K/uL (ref 4.0–10.5)
nRBC: 0 % (ref 0.0–0.2)

## 2024-10-24 LAB — BRAIN NATRIURETIC PEPTIDE: B Natriuretic Peptide: 216.7 pg/mL — ABNORMAL HIGH (ref 0.0–100.0)

## 2024-10-24 LAB — TROPONIN I (HIGH SENSITIVITY)
Troponin I (High Sensitivity): 29 ng/L — ABNORMAL HIGH (ref <18)
Troponin I (High Sensitivity): 28 ng/L — ABNORMAL HIGH (ref <18)

## 2024-10-24 LAB — MAGNESIUM: Magnesium: 2.2 mg/dL (ref 1.7–2.4)

## 2024-10-24 MED ORDER — SODIUM CHLORIDE 0.9% FLUSH: 10.0000 mL | INTRAVENOUS | Status: DC | PRN

## 2024-10-24 MED ORDER — ATORVASTATIN CALCIUM 80 MG PO TABS
80.0000 mg | ORAL_TABLET | Freq: Every day | ORAL | Status: DC
  Administered 2024-10-24 – 2024-10-25 (×2): 80 mg via ORAL
  Filled 2024-10-24 (×2): qty 1

## 2024-10-24 MED ORDER — SODIUM CHLORIDE 0.9% FLUSH
10.0000 mL | Freq: Two times a day (BID) | INTRAVENOUS | Status: DC
  Administered 2024-10-24 – 2024-10-26 (×4): 10 mL

## 2024-10-24 MED ORDER — MEMANTINE HCL 10 MG PO TABS
10.0000 mg | ORAL_TABLET | Freq: Two times a day (BID) | ORAL | Status: DC
  Administered 2024-10-24 – 2024-10-26 (×4): 10 mg via ORAL
  Filled 2024-10-24 (×4): qty 1

## 2024-10-24 MED ORDER — HYDRALAZINE HCL 25 MG PO TABS
25.0000 mg | ORAL_TABLET | Freq: Every day | ORAL | Status: DC
  Administered 2024-10-24 – 2024-10-25 (×2): 25 mg via ORAL
  Filled 2024-10-24 (×2): qty 1

## 2024-10-24 MED ORDER — CHLORHEXIDINE GLUCONATE CLOTH 2 % EX PADS
6.0000 | MEDICATED_PAD | Freq: Every day | CUTANEOUS | Status: DC
  Administered 2024-10-25: 6 via TOPICAL

## 2024-10-24 MED ORDER — SEVELAMER CARBONATE 800 MG PO TABS
800.0000 mg | ORAL_TABLET | Freq: Three times a day (TID) | ORAL | Status: DC
  Administered 2024-10-25 – 2024-10-26 (×4): 800 mg via ORAL
  Filled 2024-10-24 (×4): qty 1

## 2024-10-24 MED ORDER — LEVOTHYROXINE SODIUM 75 MCG PO TABS
75.0000 ug | ORAL_TABLET | Freq: Every morning | ORAL | Status: DC
  Administered 2024-10-25 – 2024-10-26 (×2): 75 ug via ORAL
  Filled 2024-10-24 (×2): qty 1

## 2024-10-24 MED ORDER — NICOTINE 21 MG/24HR TD PT24
21.0000 mg | MEDICATED_PATCH | Freq: Every day | TRANSDERMAL | Status: DC
  Administered 2024-10-24 – 2024-10-25 (×2): 21 mg via TRANSDERMAL
  Filled 2024-10-24 (×2): qty 1

## 2024-10-24 MED ORDER — FEBUXOSTAT 40 MG PO TABS
80.0000 mg | ORAL_TABLET | Freq: Every day | ORAL | Status: DC
  Administered 2024-10-24 – 2024-10-25 (×2): 80 mg via ORAL
  Filled 2024-10-24 (×3): qty 2

## 2024-10-24 MED ORDER — HEPARIN SODIUM (PORCINE) 5000 UNIT/ML IJ SOLN
5000.0000 [IU] | Freq: Three times a day (TID) | INTRAMUSCULAR | Status: DC
  Administered 2024-10-24 – 2024-10-26 (×5): 5000 [IU] via SUBCUTANEOUS
  Filled 2024-10-24 (×5): qty 1

## 2024-10-24 MED ORDER — LIDOCAINE 5 % EX PTCH
1.0000 | MEDICATED_PATCH | CUTANEOUS | Status: DC
  Administered 2024-10-24 – 2024-10-25 (×2): 1 via TRANSDERMAL
  Filled 2024-10-24 (×2): qty 1

## 2024-10-24 MED ORDER — IPRATROPIUM-ALBUTEROL 0.5-2.5 (3) MG/3ML IN SOLN: 3.0000 mL | RESPIRATORY_TRACT | Status: DC | PRN

## 2024-10-24 MED ORDER — ASPIRIN 81 MG PO TBEC
81.0000 mg | DELAYED_RELEASE_TABLET | Freq: Every day | ORAL | Status: DC
  Administered 2024-10-25 – 2024-10-26 (×2): 81 mg via ORAL
  Filled 2024-10-24 (×2): qty 1

## 2024-10-24 MED ORDER — GABAPENTIN 300 MG PO CAPS
300.0000 mg | ORAL_CAPSULE | Freq: Every day | ORAL | Status: DC
  Administered 2024-10-24 – 2024-10-25 (×2): 300 mg via ORAL
  Filled 2024-10-24 (×2): qty 1

## 2024-10-24 NOTE — ED Notes (Signed)
 Admitting at bedside

## 2024-10-24 NOTE — ED Notes (Signed)
 Called by CCMD about patients runs of vtach, MD made aware

## 2024-10-24 NOTE — ED Notes (Signed)
Renal dinner tray ordered 

## 2024-10-24 NOTE — ED Provider Notes (Addendum)
 [place] EMERGENCY DEPARTMENT AT [facility] Provider Note   CSN: [id number] Arrival date & time: 10/24/24  1319     Patient presents with: Chest Pain   Joseph Hebert. is a 78 y.o. male.   Patient is a 78 year old male who presents with chest pain.  He was brought from his dialysis center in [place].  History is obtained from the patient and EMS.  He had started having some chest pain about an hour into denies dialysis.  He said the pain lasted just a few minutes.  He also had some shortness of breath that lasted a little bit longer about 10 to 15 minutes.  He denies any current chest pain.  No cough or cold symptoms.  He was not have any symptoms earlier this morning.  No leg swelling.  No fevers.  He describes the pain as pain in the center of his chest.  It was nonradiating.  No ongoing pain.  No history of similar symptoms in the past.  He completed about an hour of his dialysis.       Prior to Admission medications   Medication Sig Start Date End Date Taking? Authorizing Provider  aspirin  EC 81 MG tablet Take 81 mg by mouth daily. Swallow whole.    [provider]  atorvastatin  (LIPITOR ) 80 MG tablet Take 80 mg by mouth at bedtime. (2100)    [provider]  Febuxostat  80 MG TABS Take 80 mg by mouth daily.    [provider]  gabapentin  (NEURONTIN ) 100 MG capsule Take 200 capsules by mouth at bedtime. 10/29/23   [provider]  hydrALAZINE  (APRESOLINE ) 25 MG tablet Take 1 tablet (25 mg total) by mouth 2 (two) times daily. 02/15/24   Laurence Locus, DO  HYDROcodone -acetaminophen  (NORCO/VICODIN) 5-325 MG tablet Take 1 tablet by mouth every 6 (six) hours as needed for moderate pain (pain score 4-6). 06/10/24   Bethanie Cough, PA-C  ipratropium-albuterol  (DUONEB) 0.5-2.5 (3) MG/3ML SOLN Take 3 mLs by nebulization every 6 (six) hours as needed. 02/15/24 06/17/24  Laurence Locus, DO  levothyroxine  (SYNTHROID ) 75 MCG tablet Take 75 mcg by mouth  daily. 02/06/24   [provider]  memantine  (NAMENDA ) 10 MG tablet Take 1 tablet by mouth 2 (two) times daily. 10/29/23   [provider]  SENSIPAR 30 MG tablet Take 30 mg by mouth every Monday, Wednesday, and Friday. 05/13/24   [provider]  sevelamer  carbonate (RENVELA ) 800 MG tablet Take 800 mg by mouth 3 (three) times daily.    [provider]    Allergies: Lopressor  [metoprolol  tartrate]    Review of Systems  Constitutional:  Negative for chills, diaphoresis, fatigue and fever.  HENT:  Negative for congestion, rhinorrhea and sneezing.   Eyes: Negative.   Respiratory:  Positive for shortness of breath. Negative for cough.   Cardiovascular:  Positive for chest pain. Negative for leg swelling.  Gastrointestinal:  Negative for abdominal pain, diarrhea, nausea and vomiting.  Musculoskeletal:  Negative for arthralgias and back pain.  Skin:  Negative for rash.  Neurological:  Negative for dizziness, speech difficulty, weakness, numbness and headaches.    Updated Vital Signs BP (!) 149/69   Pulse 73   Temp 97.8 F (36.6 C) (Oral)   Resp 18   Ht 5' 10 (1.778 m)   Wt 84.4 kg   SpO2 100%   BMI 26.69 kg/m   Physical Exam Constitutional:      Appearance: He is well-developed.  HENT:     Head: Normocephalic and atraumatic.  Eyes:     Pupils: Pupils are equal, round, and reactive to light.  Cardiovascular:     Rate and Rhythm: Normal rate and regular rhythm.     Heart sounds: Normal heart sounds.  Pulmonary:     Effort: Pulmonary effort is normal. No respiratory distress.     Breath sounds: Normal breath sounds. No wheezing or rales.     Comments: Few scattered wheezes in the bases otherwise clear Chest:     Chest wall: No tenderness.  Abdominal:     General: Bowel sounds are normal.     Palpations: Abdomen is soft.     Tenderness: There is no abdominal tenderness. There is no guarding or rebound.  Musculoskeletal:        General:  Normal range of motion.     Cervical back: Normal range of motion and neck supple.     Comments: No edema or calf tenderness  Lymphadenopathy:     Cervical: No cervical adenopathy.  Skin:    General: Skin is warm and dry.     Findings: No rash.  Neurological:     Mental Status: He is alert and oriented to person, place, and time.     (all labs ordered are listed, but only abnormal results are displayed) Labs Reviewed  BASIC METABOLIC PANEL WITH GFR - Abnormal; Notable for the following components:      Result Value   Glucose, Bld 133 (*)    BUN 25 (*)    Creatinine, Ser 3.96 (*)    Calcium  8.2 (*)    GFR, Estimated 15 (*)    All other components within normal limits  CBC WITH DIFFERENTIAL/PLATELET - Abnormal; Notable for the following components:   RBC 3.66 (*)    Hemoglobin 12.0 (*)    HCT 37.3 (*)    MCV 101.9 (*)    Platelets 138 (*)    All other components within normal limits  BRAIN NATRIURETIC PEPTIDE - Abnormal; Notable for the following components:   B Natriuretic Peptide 216.7 (*)    All other components within normal limits  TROPONIN I (HIGH SENSITIVITY) - Abnormal; Notable for the following components:   Troponin I (High Sensitivity) 29 (*)    All other components within normal limits  MAGNESIUM   TROPONIN I (HIGH SENSITIVITY)    EKG: EKG Interpretation Date/Time:  Saturday October 24 2024 13:40:38 EST Ventricular Rate:  80 PR Interval:  183 QRS Duration:  107 QT Interval:  402 QTC Calculation: 464 R Axis:   6  Text Interpretation: Sinus or ectopic atrial rhythm Ventricular premature complex Probable anteroseptal infarct, old Nonspecific T abnormalities, lateral leads similar to prior EKGs Confirmed by Lenor Hollering [phone]) on 10/24/2024 1:44:59 PM  Radiology: [facility] Chest 2 View Result Date: 10/24/2024 CLINICAL DATA:  Chest pain. EXAM: CHEST - 2 VIEW COMPARISON:  02/15/2024. FINDINGS: The heart size and mediastinal contours are within normal limits. There  is atherosclerotic calcification aorta. No consolidation, effusion, or pneumothorax is seen. A right internal jugular central venous catheter terminates over the right atrium. No acute osseous abnormality. IMPRESSION: No active cardiopulmonary disease. Electronically Signed   By: Leita Birmingham M.D.   On: 10/24/2024 14:52     Procedures   Medications Ordered in the ED - No data to display  Medical Decision Making Amount and/or Complexity of Data Reviewed Labs: ordered. Radiology: ordered.  Risk Decision regarding hospitalization.   This patient presents to the ED for concern of chest pain, this involves an extensive number of treatment options, and is a complaint that carries with it a high risk of complications and morbidity.  I considered the following differential and admission for this acute, potentially life threatening condition.  The differential diagnosis includes ACS, PE, musculoskeletal pain, GERD, pneumothorax, pneumonia, CHF, aortic dissection  MDM:    Patient is a 78 year old who presents with chest pain.  It central pain he has not had any recurrence of episodes since he has been here in the ED.  He does not have any cough or cold symptoms which were more concerning for pneumonia.  He had an EKG which does not show any ischemic changes.  His first troponin is mildly elevated but likely elevated due to his underlying kidney disease.  Second troponin is pending.  He does not have any suggestions of fluid overload.  No evidence of pneumonia.  No symptoms that be more concerning for PE or aortic dissection.  While he was here, he did have a 36 beat run of V. tach.  Discussed with Scotty, the PA on-call for cardiology who discussed with Dr. Reeta and recommended overnight observation admission.  Discussed with the internal medicine teaching resident who will admit the patient for further treatment.    CRITICAL CARE Performed by: Andrea Ness Total critical care time: 70 minutes Critical care time was exclusive of separately billable procedures and treating other patients. Critical care was necessary to treat or prevent imminent or life-threatening deterioration. Critical care was time spent personally by me on the following activities: development of treatment plan with patient and/or surrogate as well as nursing, discussions with consultants, evaluation of patient's response to treatment, examination of patient, obtaining history from patient or surrogate, ordering and performing treatments and interventions, ordering and review of laboratory studies, ordering and review of radiographic studies, pulse oximetry and re-evaluation of patient's condition.   (Labs, imaging, consults)  Labs: I Ordered, and personally interpreted labs.  The pertinent results include: Mildly elevated troponin  Imaging Studies ordered: I ordered imaging studies including chest x-ray I independently visualized and interpreted imaging. I agree with the radiologist interpretation  Additional history obtained from wife at bedside.  External records from outside source obtained and reviewed including history  Cardiac Monitoring: The patient was maintained on a cardiac monitor.  If on the cardiac monitor, I personally viewed and interpreted the cardiac monitored which showed an underlying rhythm of: Sinus rhythm  Reevaluation: After the interventions noted above, I reevaluated the patient and found that they have :improved  Social Determinants of Health:    Disposition: Admit to hospital  Co morbidities that complicate the patient evaluation  Past Medical History:  Diagnosis Date   Acute encephalopathy    Acute hypoxemic respiratory failure (HCC)    Acute pulmonary edema (HCC)    Acute respiratory failure with hypoxia (HCC)    Anemia    Arrhythmia    PVCs; takes metoprolol  daily   Arthritis    Asthma    Bilateral leg pain 06/16/2015    CAD (coronary artery disease)    minimal, nonobstructive   Cardiac asystole (HCC)    after back surgery in 2018   Cardiogenic shock (HCC)    Carotid artery occlusion    s/p CEA in 2011   Chronic back pain  s/p lumbar fusion   COPD (chronic obstructive pulmonary disease) (HCC)    Spirva daily and Albuterol  as needed   Dementia (HCC)    Dementia (HCC)    Depression    Diabetes mellitus without complication (HCC)    Type 2 diet controlled.Never been on meds, does not check blood sugar   ESRD (end stage renal disease) (HCC)    t-th-sat dialysis   GERD (gastroesophageal reflux disease)    Gout    takes Uloric  daily   HCAP (healthcare-associated pneumonia)    Head injury    as a child   History of colon polyps    benign   History of gastric ulcer    History of hiatal hernia    Hyperlipidemia    takes Atorvastatin  and Fenofibrate  daily   Hypertension    takes Lisinopril ,Amlodipine ,and Hydralazine  daily   Intermittent claudication 07/18/2015   s/p LLE bypass   Lumbosacral spondylosis with radiculopathy 02/17/2016   Nevus of choroid of left eye 12/08/2014   Numbness    lower left leg and upper right thigh   PAD (peripheral artery disease)    Pneumonia    hx of-couple of yrs ago   S/P arteriovenous (AV) fistula creation - Left UE. 02-14-2024. 02/14/2024   Serous retinal detachment of left eye 12/08/2014   Shortness of breath dyspnea    with exertion   Status post intraocular lens implant 12/08/2014   Tuberculosis 2006    9 months    Vertigo    Wears glasses      Medicines No orders of the defined types were placed in this encounter.   I have reviewed the patients home medicines and have made adjustments as needed  Problem List / ED Course: Problem List Items Addressed This Visit   None Visit Diagnoses       Nonspecific chest pain    -  Primary     Ventricular tachycardia (paroxysmal) (HCC)                    Final diagnoses:  Nonspecific chest pain   Ventricular tachycardia (paroxysmal) [facility])    ED Discharge Orders     None          Lenor Hollering, MD 10/24/24 1654    Lenor Hollering, MD 10/24/24 1729

## 2024-10-24 NOTE — Consult Note (Addendum)
 Cardiology Consultation   Patient ID: Joseph Hebert. MRN: [id number]; DOB: [birth year]  Admit date: 10/24/2024 Date of Consult: 10/24/2024  PCP:  Keren Vicenta BRAVO, MD   [facility] Providers Cardiologist:  Dub Huntsman, DO        Patient Profile: Joseph Hebert. is a 78 y.o. male with a hx of ESRD on HD, HTN, T2DM, COPD, PAD s/p bilateral common iliac stents, hypothyroidism, tobacco use, and hx of bradycardia-asystolic cardiac arrest in 01/2017 following anterior cervical fusion (thought to be related to vagotonia +/- metoprolol  intake) who is being seen 10/24/2024 for the evaluation of chest pain during HD.    History of Present Illness: [name] presents to the ED following HD session. He reports that he while having his HD session, he started to feel unwell and then he had an episode of central, sharp, non-radiating, chest pain that lasted for 30 seconds only. No associated SOB (has chronic SOB that's at baseline), nausea, vomiting, or diaphoresis. Given this episode, he was sent to the ED for further evaluation. He reports no prior similar episodes.   Upon arrival to the ED, he reports resolution in his symptoms. While in the ED, the patient had an episode of monomorphic VT (36 beats) [unable to find this episode on telemetry; however, a strip has been uploaded in Media but the ED team). He remained asymptomatic during this episode.   BP is elevated to 190s/100s. HR in the 70s. Labs were remarkable for K 3.8, Mg 2.2, troponin 29 =>28, BNP 216. EKG with normal sinus rhythm, nonspecific TW changes, normal QTC, and 1 PVC. Telemetry review with multiform PVCs (sustained episode does not seem to be captured in telemetry but strip available in Media).   Past Medical History:  Diagnosis Date   Acute encephalopathy    Acute hypoxemic respiratory failure (HCC)    Acute pulmonary edema (HCC)    Acute respiratory failure with hypoxia (HCC)    Anemia    Arrhythmia    PVCs;  takes metoprolol  daily   Arthritis    Asthma    Bilateral leg pain 06/16/2015   CAD (coronary artery disease)    minimal, nonobstructive   Cardiac asystole (HCC)    after back surgery in 2018   Cardiogenic shock (HCC)    Carotid artery occlusion    s/p CEA in 2011   Chronic back pain    s/p lumbar fusion   COPD (chronic obstructive pulmonary disease) (HCC)    Spirva daily and Albuterol  as needed   Dementia (HCC)    Dementia (HCC)    Depression    Diabetes mellitus without complication (HCC)    Type 2 diet controlled.Never been on meds, does not check blood sugar   ESRD (end stage renal disease) (HCC)    t-th-sat dialysis   GERD (gastroesophageal reflux disease)    Gout    takes Uloric  daily   HCAP (healthcare-associated pneumonia)    Head injury    as a child   History of colon polyps    benign   History of gastric ulcer    History of hiatal hernia    Hyperlipidemia    takes Atorvastatin  and Fenofibrate  daily   Hypertension    takes Lisinopril ,Amlodipine ,and Hydralazine  daily   Intermittent claudication 07/18/2015   s/p LLE bypass   Lumbosacral spondylosis with radiculopathy 02/17/2016   Nevus of choroid of left eye 12/08/2014   Numbness    lower left leg and upper right thigh  PAD (peripheral artery disease)    Pneumonia    hx of-couple of yrs ago   S/P arteriovenous (AV) fistula creation - Left UE. 02-14-2024. 02/14/2024   Serous retinal detachment of left eye 12/08/2014   Shortness of breath dyspnea    with exertion   Status post intraocular lens implant 12/08/2014   Tuberculosis 2006    9 months    Vertigo    Wears glasses     Past Surgical History:  Procedure Laterality Date   A/V SHUNT INTERVENTION N/A 05/15/2024   Procedure: A/V SHUNT INTERVENTION;  Surgeon: Serene Gaile ORN, MD;  Location: [facility];  Service: Cardiovascular;  Laterality: N/A;   A/V SHUNT INTERVENTION Left 08/05/2024   Procedure: A/V SHUNT INTERVENTION;  Surgeon: Pearline Norman RAMAN,  MD;  Location: [facility];  Service: Cardiovascular;  Laterality: Left;   ABDOMINAL AORTOGRAM W/LOWER EXTREMITY N/A 02/20/2019   Procedure: ABDOMINAL AORTOGRAM W/LOWER EXTREMITY;  Surgeon: Harvey Carlin BRAVO, MD;  Location: [facility];  Service: Cardiovascular;  Laterality: N/A;   ABDOMINAL EXPOSURE N/A 01/22/2017   Procedure: ABDOMINAL EXPOSURE;  Surgeon: Penne Lonni [place], MD;  Location: [facility] OR;  Service: Vascular;  Laterality: N/A;   ANTERIOR LAT LUMBAR FUSION N/A 01/22/2017   Procedure: LUMBAR THREE-FOUR, LUMBAR FOUR-FIVE ANTERIOR LATERAL INTERBODY FUSION;  Surgeon: Morene Hicks Ditty, MD;  Location: [facility];  Service: Neurosurgery;  Laterality: N/A;  L3-4 L4-5 Lateral interbody fusion   ANTERIOR LUMBAR FUSION N/A 01/22/2017   Procedure: LUMBAR FIVE-SACRUM ONE ANTERIOR LUMBAR INTERBODY FUSION WITH ABDOMINAL APPROACH BY DR COLORADO;  Surgeon: Morene Hicks Ditty, MD;  Location: [facility] OR;  Service: Neurosurgery;  Laterality: N/A;   AV FISTULA PLACEMENT Left 02/14/2024   Procedure: LEFT ARM BRACHIOCEPHALIC ARTERIOVENOUS FISTULA CREATION;  Surgeon: Colorado Penne Lonni, MD;  [facility];  Service: Vascular;  Laterality: Left;   CAROTID BODY TUMOR EXCISION Left 09/21/2015   Procedure: EXCISE LEFT NECK NODULE WITH LOCAL ;  Surgeon: Carlin BRAVO Harvey, MD;  Location: [facility] OR;  Service: Vascular;  Laterality: Left;   CAROTID ENDARTERECTOMY  10/31/2010   LEFT cea   cataract surgery Bilateral    COLONOSCOPY  10/1997   in [place]   EYE SURGERY     FEMORAL-POPLITEAL BYPASS GRAFT Left 08/23/2015   Procedure: LEFT FEMORAL-POPLITEAL ARTERY BYPASS WITH GORETEX GRAFT;  Surgeon: Carlin BRAVO Harvey, MD;  Location: [facility] OR;  Service: Vascular;  Laterality: Left;   FEMORAL-POPLITEAL BYPASS GRAFT Left 02/23/2019   Procedure: REDO LEFT FEMORAL TO POPLITEAL ARTERY BYPASS GRAFT Using Propaten graft;  Surgeon: Harvey Carlin BRAVO, MD;  Location: [facility] OR;  Service: Vascular;  Laterality: Left;   FISTULA  SUPERFICIALIZATION Left 06/10/2024   Procedure: FISTULA SUPERFICIALIZATION;  Surgeon: Serene Gaile ORN, MD;  Location: [facility];  Service: Vascular;  Laterality: Left;   IR FLUORO GUIDE CV LINE RIGHT  02/12/2024   IR THORACENTESIS ASP PLEURAL SPACE W/IMG GUIDE  02/13/2024   IR US  GUIDE VASC ACCESS RIGHT  02/12/2024   JOINT REPLACEMENT  1980   RIGHT  knee   LIGATION OF COMPETING BRANCHES OF ARTERIOVENOUS FISTULA Left 06/10/2024   Procedure: LIGATION OF COMPETING BRANCHES OF ARTERIOVENOUS FISTULA;  Surgeon: Serene Gaile ORN, MD;  Location: [facility];  Service: Vascular;  Laterality: Left;   LUMBAR EPIDURAL INJECTION     LUMBAR LAMINECTOMY/DECOMPRESSION MICRODISCECTOMY Right 02/17/2016   Procedure: Laminectomy and Foraminotomy - Lumbar four-five right;  Surgeon: Morene Hicks Ditty, MD;  Location: [facility];  Service: Neurosurgery;  Laterality:  Right;  right   LUMBAR PERCUTANEOUS PEDICLE SCREW 3 LEVEL N/A 01/22/2017   Procedure: LUMBAR THREE-SACRAL ONE PERCUTANEOUS PEDICLE SCREW FIXATION WITH ROBOTIC ASSISTANCE;  Surgeon: Morene Hicks Ditty, MD;  Location: [facility];  Service: Neurosurgery;  Laterality: N/A;  L3 to S1 Percutaneous pedicle screw fixation   PERIPHERAL VASCULAR CATHETERIZATION N/A 06/24/2015   Procedure: Abdominal Aortogram;  Surgeon: Carlin FORBES Haddock, MD;  Location: [facility] INVASIVE CV LAB;  Service: Cardiovascular;  Laterality: N/A;   Stents  08/08/1998   Bilateral iliofemoral  stents, [facility]   TONSILLECTOMY     VENOUS ANGIOPLASTY  05/15/2024   Procedure: VENOUS ANGIOPLASTY;  Surgeon: Serene Gaile ORN, MD;  Location: [facility];  Service: Cardiovascular;;   VENOUS ANGIOPLASTY  08/05/2024   Procedure: VENOUS ANGIOPLASTY;  Surgeon: Pearline Norman RAMAN, MD;  Location: [facility];  Service: Cardiovascular;;     Home Medications:  Prior to Admission medications   Medication Sig Start Date End Date Taking? Authorizing Provider  amLODipine  (NORVASC ) 10 MG tablet Take 10 mg by mouth daily.   Yes  [provider]  atorvastatin  (LIPITOR ) 80 MG tablet Take 80 mg by mouth at bedtime. (2100)   Yes [provider]  gabapentin  (NEURONTIN ) 300 MG capsule Take 300 mg by mouth at bedtime.   Yes [provider]  hydrALAZINE  (APRESOLINE ) 25 MG tablet Take 1 tablet (25 mg total) by mouth 2 (two) times daily. Patient taking differently: Take 25 mg by mouth daily. 02/15/24  Yes Laurence Locus, DO  levothyroxine  (SYNTHROID ) 75 MCG tablet Take 75 mcg by mouth daily. 02/06/24  Yes [provider]  losartan  (COZAAR ) 100 MG tablet Take 100 mg by mouth daily.   Yes [provider]  aspirin  EC 81 MG tablet Take 81 mg by mouth daily. Swallow whole.    [provider]  Febuxostat  80 MG TABS Take 80 mg by mouth daily.    [provider]  gabapentin  (NEURONTIN ) 100 MG capsule Take 200 capsules by mouth at bedtime. 10/29/23   [provider]  HYDROcodone -acetaminophen  (NORCO/VICODIN) 5-325 MG tablet Take 1 tablet by mouth every 6 (six) hours as needed for moderate pain (pain score 4-6). 06/10/24   Bethanie Cough, PA-C  ipratropium-albuterol  (DUONEB) 0.5-2.5 (3) MG/3ML SOLN Take 3 mLs by nebulization every 6 (six) hours as needed. 02/15/24 06/17/24  Laurence Locus, DO  memantine  (NAMENDA ) 10 MG tablet Take 1 tablet by mouth 2 (two) times daily. 10/29/23   [provider]  SENSIPAR 30 MG tablet Take 30 mg by mouth every Monday, Wednesday, and Friday. 05/13/24   [provider]  sevelamer  carbonate (RENVELA ) 800 MG tablet Take 800 mg by mouth 3 (three) times daily.    [provider]    Scheduled Meds:  heparin   5,000 Units Subcutaneous Q8H   Continuous Infusions:  PRN Meds:   Allergies:    Allergies  Allergen Reactions   Lopressor  [Metoprolol  Tartrate] Other (See Comments)    Severe bradycardia    Social History:   Social History   Socioeconomic History   Marital status: Married    Spouse name: Not on file   Number  of children: Not on file   Years of education: Not on file   Highest education level: Not on file  Occupational History   Not on file  Tobacco Use   Smoking status: Every Day    Current packs/day: 1.00    Types: Cigarettes   Smokeless tobacco: Never   Tobacco comments:  1ppd  Vaping Use   Vaping status: Never Used  Substance and Sexual Activity   Alcohol  use: No    Alcohol /week: 0.0 standard drinks of alcohol    Drug use: No   Sexual activity: Yes  Other Topics Concern   Not on file  Social History Narrative   Not on file   Social Drivers of Health   Financial Resource Strain: Not on file  Food Insecurity: No Food Insecurity (02/11/2024)   Hunger Vital Sign    Worried About Running Out of Food in the Last Year: Never true    Ran Out of Food in the Last Year: Never true  Transportation Needs: No Transportation Needs (02/11/2024)   PRAPARE - Administrator, Civil Service (Medical): No    Lack of Transportation (Non-Medical): No  Physical Activity: Not on file  Stress: Not on file  Social Connections: Moderately Integrated (02/11/2024)   Social Connection and Isolation Panel    Frequency of Communication with Friends and Family: More than three times a week    Frequency of Social Gatherings with Friends and Family: Once a week    Attends Religious Services: Never    Database Administrator or Organizations: Yes    Attends Banker Meetings: Never    Marital Status: Married  Catering Manager Violence: Not At Risk (02/11/2024)   Humiliation, Afraid, Rape, and Kick questionnaire    Fear of Current or Ex-Partner: No    Emotionally Abused: No    Physically Abused: No    Sexually Abused: No    Family History:   Family History  Problem Relation Age of Onset   Diabetes Mother    Hyperlipidemia Father    Hypertension Father    Other Father        Right Leg Amputation   Heart disease Sister        Aneyrism      ROS:  Please see the history of  present illness.  All other ROS reviewed and negative.     Physical Exam/Data: Vitals:   10/24/24 1500 10/24/24 1515 10/24/24 1530 10/24/24 1600  BP: (!) 153/93 (!) 154/58 (!) 152/68 (!) 149/69  Pulse: 77 70 67 73  Resp: (!) 22 14 10 18   Temp:      TempSrc:      SpO2: 100% 99% 100% 100%  Weight:      Height:       No intake or output data in the 24 hours ending 10/24/24 1811    10/24/2024    1:25 PM 07/06/2024    1:43 PM 06/17/2024   10:42 AM  Last 3 Weights  Weight (lbs) 186 lb 186 lb 186 lb  Weight (kg) 84.369 kg 84.369 kg 84.369 kg     Body mass index is 26.69 kg/m.  General:  Well nourished, well developed, in no acute distress HEENT: normal Neck: no JVD Vascular: Distal pulses 2+ bilaterally Cardiac:  normal S1, S2; RRR; no murmur  Lungs:  Mild bilateral wheezes, more on the right Abd: soft, nontender, no hepatomegaly  Ext: no edema Musculoskeletal:  No deformities, BUE and BLE strength normal and equal Skin: warm and dry  Neuro:  CNs 2-12 intact, no focal abnormalities noted Psych:  Normal affect   EKG:  The EKG was personally reviewed and demonstrates:  normal sinus rhythm, nonspecific T wave abnormalities with 1 PVC Telemetry:  Telemetry was personally reviewed and demonstrates:  normal sinus rhythm with multiform PVCs  Relevant CV  Studies: TTE 01/2017:  - Left ventricle: The cavity size was normal. Wall thickness was    increased in a pattern of mild LVH. Systolic function was    vigorous. The estimated ejection fraction was in the range of 65%    to 70%. Wall motion was normal; there were no regional wall    motion abnormalities. Doppler parameters are consistent with    abnormal left ventricular relaxation (grade 1 diastolic    dysfunction).  - Right atrium: The atrium was mildly dilated.  - Pulmonary arteries: Systolic pressure was moderately increased.    PA peak pressure: 54 mm Hg (S).   SPECT 12/2018: There was no ST segment deviation noted during  stress. There were frequent PVCs, ventricular couplets and ventricular salvo. The perfusion study is normal. Gated images not performed due to frequent ectopy. Consider 2D echo to assess further. This is a low risk study.  Laboratory Data: High Sensitivity Troponin:   Recent Labs  Lab 10/24/24 1357 10/24/24 1537  TROPONINIHS 29* 28*     Chemistry Recent Labs  Lab 10/24/24 1357 10/24/24 1537  NA 138  --   K 3.8  --   CL 100  --   CO2 24  --   GLUCOSE 133*  --   BUN 25*  --   CREATININE 3.96*  --   CALCIUM  8.2*  --   MG  --  2.2  GFRNONAA 15*  --   ANIONGAP 14  --     No results for input(s): PROT, ALBUMIN , AST, ALT, ALKPHOS, BILITOT in the last 168 hours. Lipids No results for input(s): CHOL, TRIG, HDL, LABVLDL, LDLCALC, CHOLHDL in the last 168 hours.  Hematology Recent Labs  Lab 10/24/24 1357  WBC 7.1  RBC 3.66*  HGB 12.0*  HCT 37.3*  MCV 101.9*  MCH 32.8  MCHC 32.2  RDW 13.3  PLT 138*   Thyroid  No results for input(s): TSH, FREET4 in the last 168 hours.  BNP Recent Labs  Lab 10/24/24 1357  BNP 216.7*    DDimer No results for input(s): DDIMER in the last 168 hours.  Radiology/Studies:  DG Chest 2 View Result Date: 10/24/2024 CLINICAL DATA:  Chest pain. EXAM: CHEST - 2 VIEW COMPARISON:  02/15/2024. FINDINGS: The heart size and mediastinal contours are within normal limits. There is atherosclerotic calcification aorta. No consolidation, effusion, or pneumothorax is seen. A right internal jugular central venous catheter terminates over the right atrium. No acute osseous abnormality. IMPRESSION: No active cardiopulmonary disease. Electronically Signed   By: Leita Birmingham M.D.   On: 10/24/2024 14:52     Assessment and Plan: Chest pain, likely non-cardiac Monomorphic VT  1 episode of sharp, non-radiating, central chest pain that lasted for ~30 seconds while getting HD. Given the duration and description of this chest pain, this  is less likely to be cardiac. Troponin is mildly elevated (28=>29) but flat (has been chronically elevated and in fact today it's less than prior values). EKG with nonspecific TW changes. Overall, this is not consistent with ACS, and chest pain is less likely to be cardiac.   While in the ED, he had one episode of asymptomatic monomorphic VT (36 beats). K 3.8 and Mg 2.2. Ddx for this episode of VT is electrolyte shift-related VT given the dialysis context vs dialytic hemodynamic stress related VT. No recent TTEs to assess cardiac structure and function.   - Monitor on telemetry overnight - TTE in the AM - Keep K >4 and Mg >  2 - Will hold off on starting beta blockers given his history of bradycardia/asystolic cardiac arrest in 01/2017. However, as per chart review, this episode seems to be following anterior cervical fusion and was likely vagally mediated and less likely related to metoprolol , which he had been on. Will continue to monitor his rhythm and if he continues to have a high burden of PVCs/NSVTs, I think it would be reasonable to start coreg 3.125 mg BID and monitor.     For questions or updates, please contact [facility] Please consult [email or web address] for contact info under      Signed, Gillian CHRISTELLA Cass, MD  10/24/2024 6:11 PM

## 2024-10-24 NOTE — ED Triage Notes (Signed)
 Pt arrives EMS coming from HD in [place] - called for CP. When EMS arrived patient had no CP, before lasted about 10-15 minutes. Central CP, sharp, not radiating. Only received 1 hour, supposed to get 3.5 - pulled off 990.   HR 84 102/70 96% RA 178 CBG

## 2024-10-24 NOTE — Progress Notes (Signed)
   10/24/24 1928  Provider Notification  Provider Name/Title Karna Fellows, MD  Date Provider Notified 10/24/24  Time Provider Notified 1928  Method of Notification Page  Notification Reason Other (Comment) (Provider request to: Dropple of his LUE Fistula.)  Provider response See new orders (Vas U/S Duplex Dialysis Access)  Date of Provider Response 10/24/24  Time of Provider Response 1940   Unable to hear with Doppler any flow or bruit, also unable to palpate a thrill. Patient state fistula has not been working since he got it in.

## 2024-10-24 NOTE — Hospital Course (Addendum)
 HD had chest discomfort, ladsted 15 minutes  Central CP with SOB   V-tach 36 beats here, asymptomatic   Cards consulted... said we need to consult.     DM which does not require medication, htn and ESRD.       MWF: cimacalcet   11/9  Wants breakfast Denies chest pain Have never used L fistula for dialysis, because of a blood clot, not sure who gave that diagnosis. They said it wouldn't work the day after they made the anastamosis in February. Confused about the L fistula in why is might need to be worked up.

## 2024-10-24 NOTE — ED Notes (Signed)
 Okay to give water /cracker per MD

## 2024-10-24 NOTE — ED Notes (Signed)
 Cardiac monitor is reading x3 measurement malfunctioning.  [employer] was contacted to report the issue.  Mr. Fury/[employer] replaced the monitor with a loaner that does not have 12 lead capability and uses the white tip cord that can be obtained from [facility] office. A white tip cord was obtained and placed in RM 10 by the NT on duty.

## 2024-10-24 NOTE — H&P (Cosign Needed Addendum)
 Date: 10/24/2024               Patient Name:  Joseph Hebert. MRN: [id number]  DOB: 26-Jan-1946 Age / Sex: 78 y.o., male   PCP: Keren Vicenta BRAVO, MD         Medical Service: Internal Medicine Teaching Service         Attending Physician: Dr. Karna Fellows, MD      First Contact: Intern on Call: [phone]       Second Contact: Resident on Call:[phone]                    SUBJECTIVE   Chief Complaint: Chest pain  History of Present Illness: Joseph Hebert. Is a 78 year old male with a past medical history of ESRD on HD T,Th,S, COPD, PAD s/p bilateral common iliac artery stenting in July 2016, left femoral to above-knee popliteal artery bypass with PTFE, left femoral to above-knee popliteal artery bypass in March 2020, and a carotid endartectomy in 2011 who presented with chest pain.  Per patient, he was at HD when he had central chest pain for roughly 30 seconds.  He reported that the chest pain is completely resolved and he is feeling great right now.  He denies history of heartburn or abdominal pain during the chest pain.  He does not recall having any shortness of breath whenever he was having chest pain.  This is his first time having chest pain, he denies any chest pain with exertion or rest at baseline.  He does not recall whether he had shortness of breath or not associate the chest pain because of so short-lived.  He does deny diaphoresis, nausea, vomiting.  He does not have a history of heart attacks or stents to his knowledge.  He does report feeling out of breath while moving around his house in his wheelchair however he does have COPD.  He denies palpitations or history of arrhythmias.  Review of Systems: A complete ROS was negative except as per HPI.   ED Course: Patient was evaluated the emergency department hemodynamically stable.  His troponins were measured at 29 followed by measurement of 28.  EKG did not reveal signs of ischemia.  During his initial evaluation,  he had a 36 beat run of V. tach at which he was hemodynamically stable and asymptomatic.  Because of the V. tach run, IMTS was paged for admission as well as cardiology.  Past Medical History: T2DM HTN ESRD on HD T, Th, S Hypothyroidism   Meds:  Current Meds  Medication Sig   aspirin  EC 81 MG tablet Take 81 mg by mouth daily. Swallow whole.   atorvastatin  (LIPITOR ) 80 MG tablet Take 80 mg by mouth at bedtime. (2100)   Febuxostat  80 MG TABS Take 80 mg by mouth at bedtime.   gabapentin  (NEURONTIN ) 300 MG capsule Take 300 mg by mouth at bedtime.   hydrALAZINE  (APRESOLINE ) 25 MG tablet Take 1 tablet (25 mg total) by mouth 2 (two) times daily. (Patient taking differently: Take 25 mg by mouth at bedtime.)   levothyroxine  (SYNTHROID ) 75 MCG tablet Take 75 mcg by mouth daily.   memantine  (NAMENDA ) 10 MG tablet Take 1 tablet by mouth 2 (two) times daily.   SENSIPAR 30 MG tablet Take 30 mg by mouth every Monday, Wednesday, and Friday.   sevelamer  carbonate (RENVELA ) 800 MG tablet Take 800 mg by mouth 3 (three) times daily.    Allergies: Allergies as of 10/24/2024 -  Review Complete 10/24/2024  Allergen Reaction Noted   Lopressor  [metoprolol  tartrate] Other (See Comments) 02/07/2017    Past Surgical History:  Procedure Laterality Date   A/V SHUNT INTERVENTION N/A 05/15/2024   Procedure: A/V SHUNT INTERVENTION;  Surgeon: Serene Gaile ORN, MD;  Location: [facility];  Service: Cardiovascular;  Laterality: N/A;   A/V SHUNT INTERVENTION Left 08/05/2024   Procedure: A/V SHUNT INTERVENTION;  Surgeon: Pearline Norman RAMAN, MD;  Location: [facility];  Service: Cardiovascular;  Laterality: Left;   ABDOMINAL AORTOGRAM W/LOWER EXTREMITY N/A 02/20/2019   Procedure: ABDOMINAL AORTOGRAM W/LOWER EXTREMITY;  Surgeon: Harvey Carlin BRAVO, MD;  Location: [facility];  Service: Cardiovascular;  Laterality: N/A;   ABDOMINAL EXPOSURE N/A 01/22/2017   Procedure: ABDOMINAL EXPOSURE;  Surgeon: Penne Lonni [place], MD;  Location: [facility] OR;  Service: Vascular;  Laterality: N/A;   ANTERIOR LAT LUMBAR FUSION N/A 01/22/2017   Procedure: LUMBAR THREE-FOUR, LUMBAR FOUR-FIVE ANTERIOR LATERAL INTERBODY FUSION;  Surgeon: Morene Hicks Ditty, MD;  Location: [facility] OR;  Service: Neurosurgery;  Laterality: N/A;  L3-4 L4-5 Lateral interbody fusion   ANTERIOR LUMBAR FUSION N/A 01/22/2017   Procedure: LUMBAR FIVE-SACRUM ONE ANTERIOR LUMBAR INTERBODY FUSION WITH ABDOMINAL APPROACH BY DR COLORADO;  Surgeon: Morene Hicks Ditty, MD;  Location: [facility] OR;  Service: Neurosurgery;  Laterality: N/A;   AV FISTULA PLACEMENT Left 02/14/2024   Procedure: LEFT ARM BRACHIOCEPHALIC ARTERIOVENOUS FISTULA CREATION;  Surgeon: [place] Penne Lonni, MD;  Location: [facility] OR;  Service: Vascular;  Laterality: Left;   CAROTID BODY TUMOR EXCISION Left 09/21/2015   Procedure: EXCISE LEFT NECK NODULE WITH LOCAL ;  Surgeon: Carlin BRAVO Harvey, MD;  Location: [facility] OR;  Service: Vascular;  Laterality: Left;   CAROTID ENDARTERECTOMY  10/31/2010   LEFT cea   cataract surgery Bilateral    COLONOSCOPY  10/1997   in [place]   EYE SURGERY     FEMORAL-POPLITEAL BYPASS GRAFT Left 08/23/2015   Procedure: LEFT FEMORAL-POPLITEAL ARTERY BYPASS WITH GORETEX GRAFT;  Surgeon: Carlin BRAVO Harvey, MD;  Location: [facility] OR;  Service: Vascular;  Laterality: Left;   FEMORAL-POPLITEAL BYPASS GRAFT Left 02/23/2019   Procedure: REDO LEFT FEMORAL TO POPLITEAL ARTERY BYPASS GRAFT Using Propaten graft;  Surgeon: Harvey Carlin BRAVO, MD;  Location: [facility] OR;  Service: Vascular;  Laterality: Left;   FISTULA SUPERFICIALIZATION Left 06/10/2024   Procedure: FISTULA SUPERFICIALIZATION;  Surgeon: Serene Gaile ORN, MD;  Location: [facility] OR;  Service: Vascular;  Laterality: Left;   IR FLUORO GUIDE CV LINE RIGHT  02/12/2024   IR THORACENTESIS ASP PLEURAL SPACE W/IMG GUIDE  02/13/2024   IR US  GUIDE VASC ACCESS RIGHT  02/12/2024   JOINT REPLACEMENT  1980   RIGHT  knee   LIGATION OF COMPETING BRANCHES OF  ARTERIOVENOUS FISTULA Left 06/10/2024   Procedure: LIGATION OF COMPETING BRANCHES OF ARTERIOVENOUS FISTULA;  Surgeon: Serene Gaile ORN, MD;  Location: [facility] OR;  Service: Vascular;  Laterality: Left;   LUMBAR EPIDURAL INJECTION     LUMBAR LAMINECTOMY/DECOMPRESSION MICRODISCECTOMY Right 02/17/2016   Procedure: Laminectomy and Foraminotomy - Lumbar four-five right;  Surgeon: Morene Hicks Ditty, MD;  Location: [facility];  Service: Neurosurgery;  Laterality: Right;  right   LUMBAR PERCUTANEOUS PEDICLE SCREW 3 LEVEL N/A 01/22/2017   Procedure: LUMBAR THREE-SACRAL ONE PERCUTANEOUS PEDICLE SCREW FIXATION WITH ROBOTIC ASSISTANCE;  Surgeon: Morene Hicks Ditty, MD;  Location: [facility] OR;  Service: Neurosurgery;  Laterality: N/A;  L3 to S1 Percutaneous pedicle screw fixation   PERIPHERAL VASCULAR CATHETERIZATION N/A 06/24/2015  Procedure: Abdominal Aortogram;  Surgeon: Carlin FORBES Haddock, MD;  Location: [facility] INVASIVE CV LAB;  Service: Cardiovascular;  Laterality: N/A;   Stents  08/08/1998   Bilateral iliofemoral  stents, [facility]   TONSILLECTOMY     VENOUS ANGIOPLASTY  05/15/2024   Procedure: VENOUS ANGIOPLASTY;  Surgeon: Serene Gaile ORN, MD;  Location: [facility];  Service: Cardiovascular;;   VENOUS ANGIOPLASTY  08/05/2024   Procedure: VENOUS ANGIOPLASTY;  Surgeon: Pearline Norman RAMAN, MD;  Location: [facility];  Service: Cardiovascular;;    Social:  Lives With:Lives with wife Occupation:Retired from electronics engineer and [employer]  Support:Wife Level of Function:Wheelchair  ERE:Drylous, Vicenta FORBES, MD  Substances: No alcohol  use for years Tobacco: not often enough, one pack per day   Family History:  Mother: MI   OBJECTIVE:   Physical Exam: Blood pressure (!) 149/69, pulse 73, temperature 97.8 F (36.6 C), temperature source Oral, resp. rate 18, height 5' 10 (1.778 m), weight 84.4 kg, SpO2 100%.  Constitutional: well-appearing, laying in bed, in no acute distress Cardiovascular: regular  rate and rhythm, no murmurs appreciated on exam  Pulmonary/Chest: normal work of breathing on room air, bilateral expiratory wheezes present bilaterally, TDC on right chest wall present  Abdominal: soft, non-tender, non-distended Neurological: alert & oriented x 3 MSK: LUE AVF: Unable to palpate a thrill or auscultated bruit, left hand is warm with motor function and strong radial pulse, no cyanosis or discoloration of the left upper extremity, no pitting edema Skin: warm and dry Psych: Normal mood and affect  Labs:    Latest Ref Rng & Units 10/24/2024    1:57 PM 06/10/2024    8:22 AM 02/15/2024    3:35 PM  CBC  WBC 4.0 - 10.5 K/uL 7.1   10.2   Hemoglobin 13.0 - 17.0 g/dL 87.9  87.0  8.4   Hematocrit 39.0 - 52.0 % 37.3  38.0  25.6   Platelets 150 - 400 K/uL 138   283         Latest Ref Rng & Units 10/24/2024    1:57 PM 06/10/2024    8:22 AM 02/16/2024    7:16 AM  CMP  Glucose 70 - 99 mg/dL 866  99  85   BUN 8 - 23 mg/dL 25  52  43   Creatinine 0.61 - 1.24 mg/dL 6.03  4.89  5.28   Sodium 135 - 145 mmol/L 138  135  136   Potassium 3.5 - 5.1 mmol/L 3.8  4.2  4.0   Chloride 98 - 111 mmol/L 100  100  93   CO2 22 - 32 mmol/L 24   23   Calcium  8.9 - 10.3 mg/dL 8.2   9.3       Imaging: DG Chest 2 View Result Date: 10/24/2024 CLINICAL DATA:  Chest pain. EXAM: CHEST - 2 VIEW COMPARISON:  02/15/2024. FINDINGS: The heart size and mediastinal contours are within normal limits. There is atherosclerotic calcification aorta. No consolidation, effusion, or pneumothorax is seen. A right internal jugular central venous catheter terminates over the right atrium. No acute osseous abnormality. IMPRESSION: No active cardiopulmonary disease. Electronically Signed   By: Leita Birmingham M.D.   On: 10/24/2024 14:52      EKG: personally reviewed my interpretation is NSR with occasional PVC. Prior EKG showed NSR with 1st degree AV block  ASSESSMENT & PLAN:   Assessment & Plan by Problem: Principal  Problem:   Arrhythmia Active Problems:   Hyperlipidemia  PAD (peripheral artery disease)   COPD (chronic obstructive pulmonary disease) (HCC)   Diabetes mellitus without complication (HCC)   ESRD (end stage renal disease) on dialysis (HCC)   S/P arteriovenous (AV) fistula creation - Left UE. 02-14-2024.   Ventricular tachycardia (paroxysmal) (HCC)   Nonspecific chest pain   Joseph Hebert. Is a 78 year old male with a past medical history of ESRD on HD T,Th,S, COPD, PAD s/p bilateral common iliac artery stenting in July 2016, left femoral to above-knee popliteal artery bypass with PTFE, left femoral to above-knee popliteal artery bypass in March 2020, and a carotid endartectomy in 2011 who is admitted for evaluation of ventricular tachycardia.  Asymptomatic Ventricular Tachycardia, 36 beats In the emergency department, patient had a 36 beat run of asymptomatic ventricular tachycardia.  During this time he remained hemodynamically stable.  Cardiology was consulted who suspects that patient had an electrolyte shift during hemodialysis causing the V. tach.  Patient's K was measured at 3.8 with a mag of 2.2. Plan: - Echocardiogram in the morning -Cardiology recommends not administering beta-blockers given the history of bradycardia/asystolic cardiac arrest in 2018. -Telemetry -I will hesitate to replace potassium as patient is ESRD and hemodialysis session was shortened today. -Daily BMPs and magnesium  while admitted -Patient will likely discharge home tomorrow if echocardiogram is normal and there were no further episodes of ventricular tachycardia overnight  Chest Pain Resolved.  Based off patient's description of a sharp sudden central chest pain that were short acting without other symptoms, I do suspect that this was MSK in nature from costochondritis versus electrolyte shift during dialysis.  Very low suspicion for ACS given the flat troponins and normal EKG. Plan: - Telemetry -  Lidocaine  patch  PAD s/p bilateral common iliac artery stenting in July 2016, left femoral to above-knee popliteal artery bypass with PTFE, left femoral to above-knee popliteal artery bypass in March 2020 Bilateral lower extremities are warm and dry with palpable dorsalis pedis pulses present bilaterally. Plan: - Continue aspirin  81 mg and Lipitor  80 mg  COPD Tobacco use Patient has a history of tobacco use with 1 pack/day.  Patient was unable to give me a pack-year history.  He does not have a official diagnosis of COPD to my knowledge, I am unable to find PFTs on his record.  On exam, he did have bilateral expiratory wheezes but is on room air with normal effort.  No concern for active exacerbation. Plan: - DuoNebs every 4 hours as needed - Nicotine  patch ordered - Recommend outpatient PFTs - Incentive spirometer ordered  Hypothyroidism Patient is on home regimen of levothyroxine  75 mcg daily.  I do not see a recent TSH on the records. - TSH ordered  HTN Patient has a history of hypertension on a home regimen of hydralazine  25 mg nightly.  I suspect that patient's blood pressure is high today due to the dialysis session being shortened. Plan: - Continue home regimen of hydralazine  25 mg nightly  ESRD on HD T, TH, S Patient reports that his hemodialysis session was cut short today due to him developing chest pain.  Fortunately he does not have an urgent or emergent need for hemodialysis today as he said he is not uremic, acidemic, hyperkalemic, nor is he requiring supplemental oxygen  due to volume overload.  On exam, I was unable to palpate a thrill or auscultated bruit of the left upper extremity. Plan: - Will consult nephrology tomorrow if he does not discharge home - Daily potassium monitoring -  Obtain US  Doppler of left upper extremity fistula to evaluate for flow  T2DM Patient reports that he does not use medication to treat his diabetes at home.  His last A1c obtained was 5.3  8 months ago. - Follow-up A1c - No need for SSI at this time  Diet: Renal VTE: Heparin  Code: Full  Prior to Admission Living Arrangement: Home, living with wife Anticipated Discharge Location: Home Barriers to Discharge: Evaluation of arrhythmia   Dispo: Admit patient to Observation with expected length of stay less than 2 midnights.  Signed:   Damien Lease, DO Internal Medicine Resident PGY-2 10/24/2024, 8:18 PM   Please contact the on call pager at [phone]

## 2024-10-25 ENCOUNTER — Observation Stay (HOSPITAL_COMMUNITY)

## 2024-10-25 DIAGNOSIS — I739 Peripheral vascular disease, unspecified: Secondary | ICD-10-CM

## 2024-10-25 DIAGNOSIS — Z95820 Peripheral vascular angioplasty status with implants and grafts: Secondary | ICD-10-CM | POA: Diagnosis not present

## 2024-10-25 DIAGNOSIS — J449 Chronic obstructive pulmonary disease, unspecified: Secondary | ICD-10-CM

## 2024-10-25 DIAGNOSIS — I4729 Other ventricular tachycardia: Secondary | ICD-10-CM

## 2024-10-25 DIAGNOSIS — Z79899 Other long term (current) drug therapy: Secondary | ICD-10-CM

## 2024-10-25 DIAGNOSIS — E039 Hypothyroidism, unspecified: Secondary | ICD-10-CM

## 2024-10-25 DIAGNOSIS — Z79890 Hormone replacement therapy (postmenopausal): Secondary | ICD-10-CM

## 2024-10-25 DIAGNOSIS — I472 Ventricular tachycardia, unspecified: Secondary | ICD-10-CM

## 2024-10-25 DIAGNOSIS — I120 Hypertensive chronic kidney disease with stage 5 chronic kidney disease or end stage renal disease: Secondary | ICD-10-CM

## 2024-10-25 DIAGNOSIS — F17210 Nicotine dependence, cigarettes, uncomplicated: Secondary | ICD-10-CM

## 2024-10-25 DIAGNOSIS — I493 Ventricular premature depolarization: Secondary | ICD-10-CM

## 2024-10-25 DIAGNOSIS — E1122 Type 2 diabetes mellitus with diabetic chronic kidney disease: Secondary | ICD-10-CM

## 2024-10-25 DIAGNOSIS — N186 End stage renal disease: Secondary | ICD-10-CM

## 2024-10-25 DIAGNOSIS — Z992 Dependence on renal dialysis: Secondary | ICD-10-CM

## 2024-10-25 DIAGNOSIS — R079 Chest pain, unspecified: Secondary | ICD-10-CM

## 2024-10-25 DIAGNOSIS — R0789 Other chest pain: Secondary | ICD-10-CM

## 2024-10-25 LAB — ECHOCARDIOGRAM COMPLETE
AR max vel: 1.26 cm2
AV Area VTI: 1.41 cm2
AV Area mean vel: 1.26 cm2
AV Mean grad: 4 mmHg
AV Peak grad: 8.7 mmHg
Ao pk vel: 1.48 m/s
Area-P 1/2: 3.65 cm2
Height: 70 in
S' Lateral: 4 cm
Weight: 2994.73 [oz_av]

## 2024-10-25 LAB — HEMOGLOBIN A1C
Hgb A1c MFr Bld: 4.4 % — ABNORMAL LOW (ref 4.8–5.6)
Mean Plasma Glucose: 79.58 mg/dL

## 2024-10-25 LAB — LIPID PANEL
Cholesterol: 83 mg/dL (ref 0–200)
HDL: 39 mg/dL — ABNORMAL LOW (ref >40)
LDL Cholesterol: 32 mg/dL (ref 0–99)
Total CHOL/HDL Ratio: 2.1 ratio
Triglycerides: 62 mg/dL (ref <150)
VLDL: 12 mg/dL (ref 0–40)

## 2024-10-25 LAB — CBC
HCT: 35 % — ABNORMAL LOW (ref 39.0–52.0)
Hemoglobin: 11.5 g/dL — ABNORMAL LOW (ref 13.0–17.0)
MCH: 32.9 pg (ref 26.0–34.0)
MCHC: 32.9 g/dL (ref 30.0–36.0)
MCV: 100 fL (ref 80.0–100.0)
Platelets: 158 K/uL (ref 150–400)
RBC: 3.5 MIL/uL — ABNORMAL LOW (ref 4.22–5.81)
RDW: 13.4 % (ref 11.5–15.5)
WBC: 7.7 K/uL (ref 4.0–10.5)
nRBC: 0 % (ref 0.0–0.2)

## 2024-10-25 LAB — RENAL FUNCTION PANEL
Albumin: 3.2 g/dL — ABNORMAL LOW (ref 3.5–5.0)
Anion gap: 12 (ref 5–15)
BUN: 29 mg/dL — ABNORMAL HIGH (ref 8–23)
CO2: 25 mmol/L (ref 22–32)
Calcium: 8.2 mg/dL — ABNORMAL LOW (ref 8.9–10.3)
Chloride: 99 mmol/L (ref 98–111)
Creatinine, Ser: 4.4 mg/dL — ABNORMAL HIGH (ref 0.61–1.24)
GFR, Estimated: 13 mL/min — ABNORMAL LOW (ref >60)
Glucose, Bld: 83 mg/dL (ref 70–99)
Phosphorus: 4.1 mg/dL (ref 2.5–4.6)
Potassium: 4 mmol/L (ref 3.5–5.1)
Sodium: 136 mmol/L (ref 135–145)

## 2024-10-25 LAB — MAGNESIUM: Magnesium: 2.2 mg/dL (ref 1.7–2.4)

## 2024-10-25 MED ORDER — CARVEDILOL 3.125 MG PO TABS
3.1250 mg | ORAL_TABLET | Freq: Two times a day (BID) | ORAL | Status: DC
  Administered 2024-10-25 – 2024-10-26 (×2): 3.125 mg via ORAL
  Filled 2024-10-25 (×2): qty 1

## 2024-10-25 MED ORDER — BENZONATATE 100 MG PO CAPS: 200.0000 mg | ORAL_CAPSULE | Freq: Three times a day (TID) | ORAL | Status: DC | PRN

## 2024-10-25 NOTE — Progress Notes (Signed)
 Subjective: Joseph Hebert. Is a 78 year old male with a past medical history of ESRD on HD T,Th,S, COPD, PAD s/p bilateral common iliac artery stenting in July 2016, left femoral to above-knee popliteal artery bypass with PTFE, left femoral to above-knee popliteal artery bypass in March 2020, and a carotid endartectomy in 2011 who is admitted for evaluation of ventricular tachycardia.   No overnight events.  Today, he reports feeling hungry but denies any other complaints. He stated that his AVF hasn't worked since he had it placed and it is not bothersome to him. He denies chest pain, shortness of breath, as well as palpitations.   Objective:  Vital signs in last 24 hours: Vitals:   10/25/24 0534 10/25/24 0719 10/25/24 1110 10/25/24 1508  BP: (!) 179/76 (!) 176/98 (!) 166/76   Pulse: 70   83  Resp: 20 18 18    Temp: 97.7 F (36.5 C) 97.6 F (36.4 C) (!) 97.4 F (36.3 C)   TempSrc: Oral Oral Oral   SpO2: 96%  97%   Weight:      Height:       Physical Exam: General:NAD, laying in bed Cardiac:RRR, TDC on right chest wall, Telemetry revealed short nonsustained runs of ventricular bigeminy and nonsustained vtach with PVCs.  Pulmonary:Normal effort on room air Neuro:awake, alert, participating in conversation  Skin:warm and dry Psych:  normal mood and affect      Latest Ref Rng & Units 10/25/2024    5:52 AM 10/24/2024    1:57 PM 06/10/2024    8:22 AM  CBC  WBC 4.0 - 10.5 K/uL 7.7  7.1    Hemoglobin 13.0 - 17.0 g/dL 88.4  87.9  87.0   Hematocrit 39.0 - 52.0 % 35.0  37.3  38.0   Platelets 150 - 400 K/uL 158  138          Latest Ref Rng & Units 10/25/2024    5:52 AM 10/24/2024    1:57 PM 06/10/2024    8:22 AM  BMP  Glucose 70 - 99 mg/dL 83  866  99   BUN 8 - 23 mg/dL 29  25  52   Creatinine 0.61 - 1.24 mg/dL 5.59  6.03  4.89   Sodium 135 - 145 mmol/L 136  138  135   Potassium 3.5 - 5.1 mmol/L 4.0  3.8  4.2   Chloride 98 - 111 mmol/L 99  100  100   CO2 22 - 32 mmol/L 25   24    Calcium  8.9 - 10.3 mg/dL 8.2  8.2       Assessment/Plan:  Principal Problem:   Arrhythmia Active Problems:   Hyperlipidemia   PAD (peripheral artery disease)   COPD (chronic obstructive pulmonary disease) (HCC)   Diabetes mellitus without complication (HCC)   ESRD (end stage renal disease) on dialysis (HCC)   S/P arteriovenous (AV) fistula creation - Left UE. 02-14-2024.   Ventricular tachycardia (paroxysmal) (HCC)   Nonspecific chest pain  Asymptomatic Ventricular Tachycardia, 36 beats  Patient had a few runs of nonsustained Vtach overnight as well as ventricular bigeminy. He remained HDS during this. Cardiology evaluated him today and started coreg 3.125 mg BID. Echocardiogram showed LVEF of 50-55% with mild aortic regurgitation.  Plan: -Continue telemetry overnight and monitor HR while on coreg -Continue coreg 3.125 mg BID -Appreciate cardiology recommendation, he will have an outpatient stress test and zio patch -BMP and Mag level in the morning  Hypothyroidism Patient is on  home regimen of levothyroxine  75 mcg daily.  I do not see a recent TSH on the records. - TSH ordered   HTN Patient has a history of hypertension on a home regimen of hydralazine  25 mg nightly.  I suspect that patient's blood pressure is high today due to the dialysis session being shortened. Plan: - Continue home regimen of hydralazine  25 mg nightly   ESRD on HD T, TH, S Fortunately he does not have an urgent or emergent need for hemodialysis today as he said he is not uremic, acidemic, hyperkalemic, nor is he requiring supplemental oxygen  due to volume overload.  I discussed patient's AVF with vascular surgery: will follow up with patient in the outpatient setting as this is not emergent.  Plan: - Will consult nephrology tomorrow if he does not discharge home - Daily potassium monitoring   T2DM A1c 4.4 - No need for SSI at this time, will follow up glucose on morning BMP  PAD s/p bilateral  common iliac artery stenting in July 2016, left femoral to above-knee popliteal artery bypass with PTFE, left femoral to above-knee popliteal artery bypass in March 2020 - Continue aspirin  81 mg and Lipitor  80 mg  COPD Tobacco use Patient has a history of tobacco use with 1 pack/day.  Patient was unable to give me a pack-year history.  He does not have a official diagnosis of COPD to my knowledge, I am unable to find PFTs on his record. No concern for active exacerbation. Plan: - DuoNebs every 4 hours as needed - Nicotine  patch ordered - Recommend outpatient PFTs - Incentive spirometer ordered  Resolved Problems:  __________________________________  Code Status: Full VTE Prophylaxis:Heparin  5,000 subq Diet:Renal IVF:N/A Barriers to Discharge:HR evaluation overnight after starting Coreg  Dispo: Anticipated discharge in approximately 1 day(s).   Kandis Perkins, DO 10/25/2024, 3:43 PM Please contact the on call pager at: [phone]

## 2024-10-25 NOTE — Plan of Care (Signed)

## 2024-10-25 NOTE — Evaluation (Signed)
 Physical Therapy Evaluation Patient Details Name: Joseph Hebert. MRN: [id number] DOB: [birth year] Today's Date: 10/25/2024  History of Present Illness  Patient is 78 yo male admitted on 10/24/24 for evaluation of chest pain. PMH significant for ESRD on HD, HTN, T2DM, COPD, PAD s/p bilateral common iliac stents, hypothyroidism, tobacco use, and hx of bradycardia-asystolic cardiac arrest in 01/2017 following anterior cervical fusion.  Clinical Impression  PTA, patient reports living in one level home with spouse. Independent at wheelchair level and modI for squat pivot transfers to/from wheelchair. Patient does demonstrate inconsistencies in information given, would benefit from confirming PLOF with family. Patient currently requires min-modA for all mobility including bed mobility, seated balance, and transfers to/from chair. Patient will continue to benefit from skilled acute PT services. Recommend return home with increased assistance initially and HHPT.      If plan is discharge home, recommend the following: A little help with walking and/or transfers;A little help with bathing/dressing/bathroom;Assistance with cooking/housework;Help with stairs or ramp for entrance;Assist for transportation;Direct supervision/assist for medications management   Can travel by private vehicle        Equipment Recommendations Other (comment) (will continue to assess)  Recommendations for Other Services       Functional Status Assessment Patient has had a recent decline in their functional status and demonstrates the ability to make significant improvements in function in a reasonable and predictable amount of time.     Precautions / Restrictions Precautions Precautions: Fall Recall of Precautions/Restrictions: Intact Restrictions Weight Bearing Restrictions Per Provider Order: No      Mobility  Bed Mobility Overal bed mobility: Needs Assistance Bed Mobility: Supine to Sit, Sit to Supine      Supine to sit: Mod assist Sit to supine: Min assist   General bed mobility comments: modA for trunk righting; minA for assist with repositionin in supine    Transfers Overall transfer level: Needs assistance Equipment used: Rolling walker (2 wheels) Transfers: Sit to/from Stand, Bed to chair/wheelchair/BSC Sit to Stand: Min assist     Squat pivot transfers: Mod assist, Min assist     General transfer comment: minA for STS with poor sequencing and hand placement    Ambulation/Gait Ambulation/Gait assistance: Min assist Gait Distance (Feet): 5 Feet Assistive device: Rolling walker (2 wheels) Gait Pattern/deviations: Shuffle, Trunk flexed       General Gait Details: poor safety  Stairs            Wheelchair Mobility     Tilt Bed    Modified Rankin (Stroke Patients Only)       Balance Overall balance assessment: Needs assistance Sitting-balance support: Feet supported, Bilateral upper extremity supported Sitting balance-Leahy Scale: Fair Sitting balance - Comments: occasional posterior LOB, patient able to tell he is leaning but difficulty correcting Postural control: Posterior lean Standing balance support: Reliant on assistive device for balance, During functional activity Standing balance-Leahy Scale: Poor Standing balance comment: requires B UE support on 2WW for support, assist to place UE on walker                             Pertinent Vitals/Pain Pain Assessment Pain Assessment: No/denies pain    Home Living Family/patient expects to be discharged to:: Private residence Living Arrangements: Spouse/significant other Available Help at Discharge: Family;Available 24 hours/day Type of Home: House Home Access: Stairs to enter Entrance Stairs-Rails: None Entrance Stairs-Number of Steps: 2 small steps from garage   Home Layout:  One level Home Equipment: Wheelchair - Forensic Psychologist (2 wheels);BSC/3in1 Additional Comments: does  not use BSC    Prior Function               Mobility Comments: Patient is poor historian and PLOF likely needs to be confirmed with family when present. Patient reports independence with squat pivot transfers at wheelchair level. W/c as primary mobility however reports he does not take w/c into bathroom and can ascend 2 stairs into home.       Extremity/Trunk Assessment        Lower Extremity Assessment Lower Extremity Assessment: RLE deficits/detail;LLE deficits/detail RLE Deficits / Details: <3/5 MMT LLE Deficits / Details: <3/5 MMT       Communication   Communication Communication: No apparent difficulties    Cognition Arousal: Alert Behavior During Therapy: WFL for tasks assessed/performed   PT - Cognitive impairments: Memory, Attention, Sequencing, Safety/Judgement                         Following commands: Intact       Cueing Cueing Techniques: Verbal cues, Tactile cues, Visual cues     General Comments      Exercises     Assessment/Plan    PT Assessment Patient needs continued PT services  PT Problem List Decreased strength;Decreased balance;Decreased activity tolerance;Decreased safety awareness;Decreased knowledge of use of DME;Decreased cognition;Decreased knowledge of precautions       PT Treatment Interventions Gait training;DME instruction;Therapeutic activities;Therapeutic exercise;Patient/family education;Balance training;Functional mobility training;Neuromuscular re-education    PT Goals (Current goals can be found in the Care Plan section)  Acute Rehab PT Goals Patient Stated Goal: to return home PT Goal Formulation: With patient Time For Goal Achievement: 11/08/24 Potential to Achieve Goals: Good    Frequency Min 2X/week     Co-evaluation               AM-PAC PT 6 Clicks Mobility  Outcome Measure Help needed turning from your back to your side while in a flat bed without using bedrails?: A Little Help needed  moving from lying on your back to sitting on the side of a flat bed without using bedrails?: A Little Help needed moving to and from a bed to a chair (including a wheelchair)?: A Little Help needed standing up from a chair using your arms (e.g., wheelchair or bedside chair)?: A Little Help needed to walk in hospital room?: A Lot Help needed climbing 3-5 steps with a railing? : Total 6 Click Score: 15    End of Session Equipment Utilized During Treatment: Gait belt Activity Tolerance: Patient tolerated treatment well Patient left: in bed;with bed alarm set;with call bell/phone within reach   PT Visit Diagnosis: Unsteadiness on feet (R26.81);Other abnormalities of gait and mobility (R26.89);Muscle weakness (generalized) (M62.81)    Time: 1355-1420 PT Time Calculation (min) (ACUTE ONLY): 25 min   Charges:   PT Evaluation $PT Eval Low Complexity: 1 Low PT Treatments $Therapeutic Activity: 8-22 mins PT General Charges $$ ACUTE PT VISIT: 1 Visit         Sherryle [place], PT, DPT [facility] Acute Rehabilitation Office: [phone]   Sherryle VEAR [place] 10/25/2024, 3:17 PM

## 2024-10-25 NOTE — Care Management Obs Status (Signed)
 MEDICARE OBSERVATION STATUS NOTIFICATION   Patient Details  Name: Joseph Hebert. MRN: [id number] Date of Birth: [birth year]   Medicare Observation Status Notification Given:  Yes    Tavarus Poteete G., RN 10/25/2024, 9:29 AM

## 2024-10-25 NOTE — Progress Notes (Signed)
  Progress Note  Patient Name: Joseph Hebert. Date of Encounter: 10/25/2024  [facility] Cardiologist: Kardie Tobb, DO   Interval Summary   No acute overnight events. Patient reports feeling relatively well. Denies chest pain or palpitations. No new or acute complaints.   Vital Signs Vitals:   10/25/24 0020 10/25/24 0534 10/25/24 0719 10/25/24 1110  BP: (!) 155/67 (!) 179/76 (!) 176/98 (!) 166/76  Pulse: 70 70    Resp: 18 20 18 18   Temp: 98.2 F (36.8 C) 97.7 F (36.5 C) 97.6 F (36.4 C) (!) 97.4 F (36.3 C)  TempSrc:  Oral Oral Oral  SpO2: 96% 96%  97%  Weight:      Height:        Intake/Output Summary (Last 24 hours) at 10/25/2024 1433 Last data filed at 10/25/2024 1112 Gross per 24 hour  Intake --  Output 700 ml  Net -700 ml      10/24/2024    6:41 PM 10/24/2024    1:25 PM 07/06/2024    1:43 PM  Last 3 Weights  Weight (lbs) 187 lb 2.7 oz 186 lb 186 lb  Weight (kg) 84.9 kg 84.369 kg 84.369 kg      Telemetry/ECG  SR with PVCs and occasional NSVT - Personally Reviewed  Physical Exam  General: Well developed, in no acute distress.  Neck: No JVD.  Cardiac: Normal rate, regular rhythm.  Resp: Normal work of breathing.  Ext: No edema.  Neuro: No gross focal deficits.  Psych: Normal affect.   Assessment & Plan  Joseph Hebert. is a 78 y.o. male with a hx of ESRD on HD, HTN, T2DM, COPD, PAD s/p bilateral common iliac stents, hypothyroidism, tobacco use, and hx of bradycardia-asystolic cardiac arrest in 01/2017 following anterior cervical fusion (thought to be related to vagotonia +/- metoprolol  intake).  Cardiology appears to have been consulted for 1 episode of fleeting chest pain during dialysis and a run of NSVT.  #Chest pain: ACS effectively ruled out given quick resolution of his atypical chest pain and flat troponins in a dialysis patient.  Patient does however have many risk factors for CAD.   #PVCs and Nonsustained VT: This is not a new finding.  PVCs are present dating back to ECG from 2017. Less likely to be ischemic. Has not had any recent testing to look for presence of myocardial scar.  Denies palpitations, lightheadedness, near syncope or syncope.  Plan:  - Patient is amenable to trialing beta blocker. He has an allergy to metoprolol  listed which is listed as bradycardia.  It appears this is not a true allergy but dates back to a cardiac arrest that he had during a cervical spine surgery in 2018.  All recent ECGs and telemetry without significant bradycardia or evidence of significant conduction disease.  Will start low-dose carvedilol in setting of his hypertension.  Continue to monitor while inpatient.  Can discharge with live Zio monitor which will also allow us  to quantify his PVC burden. - Awaiting formal echocardiogram report.  If LVEF normal, then recommend nuclear stress test which can be done as inpatient or outpatient.  Signed, Fonda Kitty, MD

## 2024-10-25 NOTE — Progress Notes (Signed)
 Echocardiogram 2D Echocardiogram has been performed.  Joseph Hebert 10/25/2024, 1:34 PM

## 2024-10-25 NOTE — Progress Notes (Signed)
 Patient had a 9 beat run of vtach. Patient is asymptomatic. Notified MD

## 2024-10-26 ENCOUNTER — Other Ambulatory Visit (HOSPITAL_COMMUNITY): Payer: Self-pay

## 2024-10-26 ENCOUNTER — Other Ambulatory Visit: Payer: Self-pay | Admitting: Cardiology

## 2024-10-26 DIAGNOSIS — I4729 Other ventricular tachycardia: Secondary | ICD-10-CM | POA: Diagnosis not present

## 2024-10-26 DIAGNOSIS — F17210 Nicotine dependence, cigarettes, uncomplicated: Secondary | ICD-10-CM | POA: Diagnosis not present

## 2024-10-26 DIAGNOSIS — T82590A Other mechanical complication of surgically created arteriovenous fistula, initial encounter: Secondary | ICD-10-CM

## 2024-10-26 DIAGNOSIS — I493 Ventricular premature depolarization: Secondary | ICD-10-CM

## 2024-10-26 DIAGNOSIS — T82590D Other mechanical complication of surgically created arteriovenous fistula, subsequent encounter: Secondary | ICD-10-CM

## 2024-10-26 DIAGNOSIS — I120 Hypertensive chronic kidney disease with stage 5 chronic kidney disease or end stage renal disease: Secondary | ICD-10-CM | POA: Diagnosis not present

## 2024-10-26 DIAGNOSIS — R079 Chest pain, unspecified: Secondary | ICD-10-CM | POA: Diagnosis not present

## 2024-10-26 DIAGNOSIS — Z992 Dependence on renal dialysis: Secondary | ICD-10-CM | POA: Diagnosis not present

## 2024-10-26 DIAGNOSIS — N186 End stage renal disease: Secondary | ICD-10-CM | POA: Diagnosis not present

## 2024-10-26 DIAGNOSIS — I472 Ventricular tachycardia, unspecified: Secondary | ICD-10-CM | POA: Diagnosis not present

## 2024-10-26 HISTORY — DX: Other mechanical complication of surgically created arteriovenous fistula, initial encounter: T82.590A

## 2024-10-26 LAB — CBC
HCT: 32.6 % — ABNORMAL LOW (ref 39.0–52.0)
Hemoglobin: 10.9 g/dL — ABNORMAL LOW (ref 13.0–17.0)
MCH: 33.6 pg (ref 26.0–34.0)
MCHC: 33.4 g/dL (ref 30.0–36.0)
MCV: 100.6 fL — ABNORMAL HIGH (ref 80.0–100.0)
Platelets: 162 K/uL (ref 150–400)
RBC: 3.24 MIL/uL — ABNORMAL LOW (ref 4.22–5.81)
RDW: 13.3 % (ref 11.5–15.5)
WBC: 7.2 K/uL (ref 4.0–10.5)
nRBC: 0 % (ref 0.0–0.2)

## 2024-10-26 LAB — RENAL FUNCTION PANEL
Albumin: 3 g/dL — ABNORMAL LOW (ref 3.5–5.0)
Anion gap: 12 (ref 5–15)
BUN: 40 mg/dL — ABNORMAL HIGH (ref 8–23)
CO2: 24 mmol/L (ref 22–32)
Calcium: 8.5 mg/dL — ABNORMAL LOW (ref 8.9–10.3)
Chloride: 102 mmol/L (ref 98–111)
Creatinine, Ser: 4.79 mg/dL — ABNORMAL HIGH (ref 0.61–1.24)
GFR, Estimated: 12 mL/min — ABNORMAL LOW (ref >60)
Glucose, Bld: 85 mg/dL (ref 70–99)
Phosphorus: 4.4 mg/dL (ref 2.5–4.6)
Potassium: 4.4 mmol/L (ref 3.5–5.1)
Sodium: 138 mmol/L (ref 135–145)

## 2024-10-26 LAB — MAGNESIUM: Magnesium: 2.2 mg/dL (ref 1.7–2.4)

## 2024-10-26 MED ORDER — CARVEDILOL 3.125 MG PO TABS
3.1250 mg | ORAL_TABLET | Freq: Two times a day (BID) | ORAL | 0 refills | Status: DC
  Filled 2024-10-26: qty 60, 30d supply, fill #0

## 2024-10-26 MED ORDER — HYDRALAZINE HCL 25 MG PO TABS
25.0000 mg | ORAL_TABLET | Freq: Every day | ORAL | 0 refills | Status: AC
  Filled 2024-10-26: qty 30, 30d supply, fill #0

## 2024-10-26 NOTE — Plan of Care (Signed)

## 2024-10-26 NOTE — TOC Initial Note (Signed)
 Transition of Care (TOC) - Initial/Assessment Note    Patient Details  Name: Joseph Hebert. MRN: [id number] Date of Birth: 1946-05-02  Transition of Care [facility]) CM/SW Contact:    Sudie Erminio Deems, RN Phone Number: 10/26/2024, 11:03 AM  Clinical Narrative:  Patient presented for arrhythmias-Hx ESRD HD TTS. PTA patient was from home with spouse. Patient has DME rolling walker and wheelchair. PT recommendations are for [facility] PT- patient is agreeable. Choice received from spouse and referral submitted to [facility]. Start of care to begin within 24-48 hours post transition home. Patient has PCP and insurance. No further home needs identified. Spouse to transport home.     Expected Discharge Plan: Home w Home Health Services Barriers to Discharge: Continued Medical Work up   Patient Goals and CMS Choice Patient states their goals for this hospitalization and ongoing recovery are:: Patient plans to return home.   Choice offered to / list presented to : Spouse      Expected Discharge Plan and Services In-house Referral: NA Discharge Planning Services: CM Consult Post Acute Care Choice: Home Health Living arrangements for the past 2 months: Single Family Home Expected Discharge Date: 10/26/24                 DME Agency: NA       HH Arranged: PT HH Agency: [facility] Date HH Agency Contacted: 10/26/24 Time HH Agency Contacted: 1102 Representative spoke with at [facility] Agency: [place]  Prior Living Arrangements/Services Living arrangements for the past 2 months: Single Family Home Lives with:: Spouse Patient language and need for interpreter reviewed:: Yes Do you feel safe going back to the place where you live?: Yes      Need for Family Participation in Patient Care: Yes (Comment) Care giver support system in place?: Yes (comment) Current home services: DME (wheelchair, and rolling walker) Criminal Activity/Legal Involvement Pertinent to Current  Situation/Hospitalization: No - Comment as needed  Activities of Daily Living   ADL Screening (condition at time of admission) Independently performs ADLs?: No Does the patient have a NEW difficulty with bathing/dressing/toileting/self-feeding that is expected to last >3 days?: No Does the patient have a NEW difficulty with getting in/out of bed, walking, or climbing stairs that is expected to last >3 days?: No Does the patient have a NEW difficulty with communication that is expected to last >3 days?: No Is the patient deaf or have difficulty hearing?: Yes Does the patient have difficulty seeing, even when wearing glasses/contacts?: No Does the patient have difficulty concentrating, remembering, or making decisions?: No  Permission Sought/Granted Permission sought to share information with : Family Supports, Case Production Designer, Theatre/television/film, Oceanographer granted to share information with : Yes, Verbal Permission Granted     Permission granted to share info w AGENCY: [facility]        Emotional Assessment Appearance:: Appears stated age Attitude/Demeanor/Rapport: Engaged Affect (typically observed): Appropriate Orientation: : Oriented to Self, Oriented to Place Alcohol  / Substance Use: Not Applicable Psych Involvement: No (comment)  Admission diagnosis:  Arrhythmia [I49.9] Ventricular tachycardia (paroxysmal) (HCC) [I47.20] Nonspecific chest pain [R07.9] Patient Active Problem List   Diagnosis Date Noted   Ventricular tachycardia (paroxysmal) (HCC) 10/24/2024   Nonspecific chest pain 10/24/2024   S/P arteriovenous (AV) fistula creation - Left UE. 02-14-2024. 02/14/2024   ESRD (end stage renal disease) on dialysis (HCC) 02/12/2024   Dependent for wheelchair mobility 02/12/2024   Bilateral pleural effusion - s/p right thoracentesis on 02-13-2024 for 1000 ml.  02/12/2024   Hyperkalemia 02/12/2024   Metabolic acidosis 02/12/2024   Acute renal failure  superimposed on stage 5 chronic kidney disease, not on chronic dialysis (HCC) 02/11/2024   History of anemia due to chronic kidney disease    Arrhythmia    Arthritis    Asthma    CAD (coronary artery disease)    Carotid artery occlusion    Chronic back pain    COPD (chronic obstructive pulmonary disease) (HCC)    Dementia (HCC)    Diabetes mellitus without complication (HCC)    Depression    GERD (gastroesophageal reflux disease)    Gout    Impaired memory    Numbness    Wears glasses    Vertigo    PVC (premature ventricular contraction) 07/14/2020   Essential hypertension 01/01/2019   Hyperlipidemia 01/01/2019   PAD (peripheral artery disease) 01/01/2019   Acute respiratory failure with hypoxia (HCC)    Lumbosacral spondylosis with radiculopathy 02/17/2016   Intermittent claudication (HCC) 07/18/2015   Bilateral leg pain 06/16/2015   Serous retinal detachment of left eye 12/08/2014   Nevus of choroid of left eye 12/08/2014   Occlusion and stenosis of carotid artery without mention of cerebral infarction 01/01/2013   PCP:  Keren Vicenta BRAVO, MD Pharmacy:   [facility] - [place], [place] - [address] [place] [address] Phone: [phone] Fax: [phone]  [facility] [address] [place] [address] Phone: [phone] Fax: [phone]  [facility] [address], [place] - [address] [place] [address] Phone: [phone] Fax: [phone]     Social Drivers of Health (SDOH) Social History: SDOH Screenings   Food Insecurity: No Food Insecurity (10/24/2024)  Housing: Low Risk  (10/24/2024)  Transportation Needs: No Transportation Needs (10/24/2024)  Utilities: Not At Risk (10/24/2024)  Social Connections: Moderately Integrated (10/24/2024)  Tobacco Use: High Risk (10/24/2024)   SDOH Interventions:     Readmission Risk Interventions     No data to display

## 2024-10-26 NOTE — Progress Notes (Addendum)
 Progress Note  Patient Name: Joseph Hebert. Date of Encounter: 10/26/2024 [facility] Cardiologist: Kardie Tobb, DO   Interval Summary    No chest pain since admission. Sitting up eating breakfast.   Vital Signs Vitals:   10/25/24 2142 10/25/24 2143 10/25/24 2336 10/26/24 0504  BP: (!) 154/70  (!) 168/81 134/61  Pulse: 78 72 70 72  Resp: 16  16 16   Temp: 97.6 F (36.4 C)  98.4 F (36.9 C) 97.8 F (36.6 C)  TempSrc: Oral  Oral Oral  SpO2: 95% 95% 96% 95%  Weight:      Height:        Intake/Output Summary (Last 24 hours) at 10/26/2024 0758 Last data filed at 10/26/2024 0506 Gross per 24 hour  Intake 840 ml  Output 600 ml  Net 240 ml      10/24/2024    6:41 PM 10/24/2024    1:25 PM 07/06/2024    1:43 PM  Last 3 Weights  Weight (lbs) 187 lb 2.7 oz 186 lb 186 lb  Weight (kg) 84.9 kg 84.369 kg 84.369 kg      Telemetry/ECG   Sinus Rhythm, Bigeminy--> occ PVCs - Personally Reviewed  Physical Exam  GEN: No acute distress.   Neck: No JVD Cardiac: RRR, no murmurs, rubs, or gallops.  Respiratory: Clear to auscultation bilaterally. GI: Soft, nontender, non-distended  MS: No edema  Assessment & Plan   78 y.o. male with a hx of ESRD on HD, HTN, T2DM, COPD, PAD s/p bilateral common iliac stents, hypothyroidism, tobacco use, and hx of bradycardia-asystolic cardiac arrest in 01/2017 following anterior cervical fusion (thought to be related to vagotonia +/- metoprolol  intake).  Cardiology appears to have been consulted for 1 episode of fleeting chest pain during dialysis and a run of NSVT.   Chest pain -- presented with brief episode of chest pain, hsTn 29>>28. No further chest pain since admission.  -- Echo 11/9 LVEF of 50-55%, no rWMA, normal RV, mild MR -- initial recs for stress testing, likely outpatient, will review final plan with MD -- continue ASA, statin, coreg   NSVT PVCs -- had an episode while in the ED of NSVT of 36 beats -- does have an  allergy listed to metoprolol , possible bradycardia? -- started on coreg 3.125mg  BID yesterday and tolerating well so far -- plan for Zio patch at discharge   ESRD on HD -- on TTS  HTN -- does report issues with low BPs at HD at times -- continue coreg 3.125mg  BID, hydralazine  25mg  daily   Per Primary PAD s/p bilateral common iliac artery stenting COPD Tobacco use Hypothyroidism  DM  For questions or updates, please contact  [facility] Please consult [email or web address] for contact info under   Signed, Manuelita Rummer, NP   Joseph Hebert. was seen by me today along with Manuelita Rummer, NP. I have personally performed an evaluation on this patient.  My findings are as follows: 78 y.o. male with ESRD on HD, COPD, DM, HTN, COPD, PAD and tobacco abuse who had one episode of chest pain during dialysis with no significant elevation in troponin. Echo 10/25/24 with normal LV function and no wall motion abnormalities. One run of NSVT.  He is tolerating Coreg.  No recurrent chest pain.   Data: EKG(s) and pertinent labs, studies, etc were personally reviewed and interpreted by me:  No EKG today.  Telemetry reviewed by me with sinus rhythm Labs reviewed by me Otherwise, I agree with  data as outlined by the advanced practice provider.  Exam performed by me: Gen: NAD Neck: No JVD Cardiac: RRR Lungs: clear bilaterally Extremities: no LE edema  My Assessment and Plan:  Chest pain: No evidence of ACS. LV function is normal. No plans for ischemic testing at this time given isolated episode of chest pain. Can consider outpatient ischemic testing is he has recurrence of chest pain.  NSVT/PVCs: No recurrence. Continue Coreg. Cardiac monitor at discharge.   OK to d/c home today from cardiac standpoint.   Signed,  Lonni Cash, MD  10/26/2024 9:41 AM

## 2024-10-26 NOTE — Discharge Instructions (Signed)
 You came to the hospital for chest pain and you were diagnosed with an irregular rhythm called ventricular tachycardia.  We treated you with Coreg.   *For your Ventricular Tachycardia  -We have started you on these following medications:  -Coreg (carvedilol) 3.125 mg twice a day   -Please see PCP in 7 to 10 days -You will have a ZIO patch placed with cardiology in the outpatient setting: this is a heart monitor.   We have continued your home medications as you were taking them.   Follow-up appointments: Please visit your family doctor in 7 to 10 days Please follow up with the cardiology office, they will call and set this up with you.   If you have any questions or concerns please feel free to call: Internal medicine clinic at [phone]   If you have any of these following symptoms, please call us  or seek care at an emergency department: -Chest Pain -Difficulty Breathing -Syncope (passing out) -Drooping of face -Slurred speech -Sudden weakness in your leg or arm -Fever -Chills   We are glad that you are feeling better, it was a pleasure to care for you!  Damien Lease DO

## 2024-10-26 NOTE — Evaluation (Signed)
 Occupational Therapy Evaluation Patient Details Name: Joseph Hebert. MRN: [id number] DOB: Mar 28, 1946 Today's Date: 10/26/2024   History of Present Illness   Patient is 78 yo male admitted on 10/24/24 for evaluation of chest pain. PMH significant for ESRD on HD, HTN, T2DM, COPD, PAD s/p bilateral common iliac stents, hypothyroidism, tobacco use, and hx of bradycardia-asystolic cardiac arrest in 01/2017 following anterior cervical fusion.     Clinical Impressions Pt presents with decline in function and safety with ADLs and ADL mobility with impaired strength, balance and endurance. PTA pt reports that he lives with his wife and was Mod I form w/c level with ADLs, pivot transfers to commode and sits at sink to bathe. Pt currently requires Sup with UB ADLs and grooming, min A with LB ADLs and min A STS/SPTs with RW. Pt eager to d/c home this afternoon     If plan is discharge home, recommend the following:   A little help with bathing/dressing/bathroom;A little help with walking and/or transfers;Assist for transportation;Help with stairs or ramp for entrance;Direct supervision/assist for financial management;Direct supervision/assist for medications management     Functional Status Assessment   Patient has had a recent decline in their functional status and demonstrates the ability to make significant improvements in function in a reasonable and predictable amount of time.     Equipment Recommendations   None recommended by OT     Recommendations for Other Services         Precautions/Restrictions   Precautions Precautions: Fall Restrictions Weight Bearing Restrictions Per Provider Order: No     Mobility Bed Mobility               General bed mobility comments: pt sitting EOB upon arrival    Transfers Overall transfer level: Needs assistance Equipment used: Rolling walker (2 wheels) Transfers: Sit to/from Stand, Bed to chair/wheelchair/BSC Sit to  Stand: Min assist   Squat pivot transfers: Min assist       General transfer comment: min A for STS with poor sequencing and hand placement      Balance Overall balance assessment: Needs assistance Sitting-balance support: Feet supported, Bilateral upper extremity supported Sitting balance-Leahy Scale: Fair Sitting balance - Comments: occasional posterior LOB, patient able to tell he is leaning but difficulty correcting Postural control: Posterior lean Standing balance support: Reliant on assistive device for balance, During functional activity Standing balance-Leahy Scale: Poor                             ADL either performed or assessed with clinical judgement   ADL Overall ADL's : Needs assistance/impaired Eating/Feeding: Set up;Independent;Sitting   Grooming: Wash/dry hands;Wash/dry face;Supervision/safety;Sitting   Upper Body Bathing: Supervision/ safety;Sitting   Lower Body Bathing: Minimal assistance   Upper Body Dressing : Supervision/safety;Sitting   Lower Body Dressing: Minimal assistance   Toilet Transfer: Minimal assistance;Stand-pivot;Cueing for safety;Cueing for sequencing;Rolling walker (2 wheels)   Toileting- Clothing Manipulation and Hygiene: Minimal assistance;Sit to/from stand       Functional mobility during ADLs: Minimal assistance;Rolling walker (2 wheels);Cueing for safety;Cueing for sequencing       Vision Baseline Vision/History: 1 Wears glasses Ability to See in Adequate Light: 0 Adequate Patient Visual Report: No change from baseline       Perception         Praxis         Pertinent Vitals/Pain Pain Assessment Pain Assessment: No/denies pain     Extremity/Trunk  Assessment Upper Extremity Assessment Upper Extremity Assessment: Overall WFL for tasks assessed   Lower Extremity Assessment Lower Extremity Assessment: Defer to PT evaluation       Communication Communication Communication: No apparent  difficulties   Cognition Arousal: Alert Behavior During Therapy: WFL for tasks assessed/performed Cognition: No family/caregiver present to determine baseline                               Following commands: Intact       Cueing  General Comments   Cueing Techniques: Verbal cues;Tactile cues;Visual cues      Exercises     Shoulder Instructions      Home Living Family/patient expects to be discharged to:: Private residence Living Arrangements: Spouse/significant other Available Help at Discharge: Family;Available 24 hours/day Type of Home: House Home Access: Stairs to enter [employer] of Steps: 2 small steps from garage Entrance Stairs-Rails: None Home Layout: One level     Bathroom Shower/Tub: Tub/shower unit;Walk-in shower   Bathroom Toilet: Standard Bathroom Accessibility: Yes   Home Equipment: Wheelchair - Forensic Psychologist (2 wheels);BSC/3in1          Prior Functioning/Environment Prior Level of Function : Patient poor historian/Family not available;Needs assist             Mobility Comments: Patient reports independence with squat pivot transfers at wheelchair level. W/c as primary mobility however reports he does not take w/c into bathroom and can ascend 2 stairs into home. ADLs Comments: pt reports Ind with ADLs/selfcare, bathes at sink wife assists as needed    OT Problem List: Decreased strength;Decreased activity tolerance;Impaired balance (sitting and/or standing);Decreased coordination;Decreased cognition;Decreased knowledge of use of DME or AE   OT Treatment/Interventions:        OT Goals(Current goals can be found in the care plan section)   Acute Rehab OT Goals Patient Stated Goal: go home OT Goal Formulation: With patient Time For Goal Achievement: 11/09/24 Potential to Achieve Goals: Good ADL Goals Pt Will Perform Grooming: with set-up;with modified independence;sitting Pt Will Perform Lower Body  Bathing: with contact guard assist;sitting/lateral leans;sit to/from stand Pt Will Perform Lower Body Dressing: with contact guard assist;sitting/lateral leans;sit to/from stand Pt Will Transfer to Toilet: with contact guard assist;with supervision;stand pivot transfer Pt Will Perform Toileting - Clothing Manipulation and hygiene: with contact guard assist;with supervision;sitting/lateral leans;sit to/from stand   OT Frequency:       Co-evaluation              AM-PAC OT 6 Clicks Daily Activity     Outcome Measure Help from another person eating meals?: None Help from another person taking care of personal grooming?: A Little Help from another person toileting, which includes using toliet, bedpan, or urinal?: A Little Help from another person bathing (including washing, rinsing, drying)?: A Little Help from another person to put on and taking off regular upper body clothing?: A Little Help from another person to put on and taking off regular lower body clothing?: A Little 6 Click Score: 19   End of Session Equipment Utilized During Treatment: Gait belt;Rolling walker (2 wheels) Nurse Communication: Mobility status  Activity Tolerance: Patient tolerated treatment well Patient left: in bed  OT Visit Diagnosis: Unsteadiness on feet (R26.81);Other abnormalities of gait and mobility (R26.89);Muscle weakness (generalized) (M62.81)                Time: 8953-8887 OT Time Calculation (min): 26 min Charges:  OT General Charges $OT Visit: 1 Visit OT Evaluation $OT Eval Low Complexity: 1 Low OT Treatments $Self Care/Home Management : 8-22 mins    Jacques Karna Loose 10/26/2024, 1:25 PM

## 2024-10-26 NOTE — Discharge Summary (Signed)
 Name: Joseph Hebert. MRN: [id number] DOB: 05-Oct-1946 78 y.o. PCP: Keren Vicenta BRAVO, MD  Date of Admission: 10/24/2024  1:19 PM Date of Discharge: 10/26/2024 10:17 AM Attending Physician: Dr. Eben  Discharge Diagnosis: Principal Problem:   Arrhythmia Active Problems:   Hyperlipidemia   PAD (peripheral artery disease)   COPD (chronic obstructive pulmonary disease) (HCC)   Diabetes mellitus without complication (HCC)   ESRD (end stage renal disease) on dialysis (HCC)   S/P arteriovenous (AV) fistula creation - Left UE. 02-14-2024.   Ventricular tachycardia (paroxysmal) (HCC)   Nonspecific chest pain    Discharge Medications: Allergies as of 10/26/2024       Reactions   Lopressor  [metoprolol  Tartrate] Other (See Comments)   Severe bradycardia        Medication List     STOP taking these medications    amLODipine  10 MG tablet Commonly known as: NORVASC    HYDROcodone -acetaminophen  5-325 MG tablet Commonly known as: NORCO/VICODIN   losartan  100 MG tablet Commonly known as: COZAAR        TAKE these medications    aspirin  EC 81 MG tablet Take 81 mg by mouth daily. Swallow whole.   atorvastatin  80 MG tablet Commonly known as: LIPITOR  Take 80 mg by mouth at bedtime. (2100)   carvedilol 3.125 MG tablet Commonly known as: COREG Take 1 tablet (3.125 mg total) by mouth 2 (two) times daily with a meal.   Febuxostat  80 MG Tabs Take 80 mg by mouth at bedtime.   gabapentin  300 MG capsule Commonly known as: NEURONTIN  Take 300 mg by mouth at bedtime.   hydrALAZINE  25 MG tablet Commonly known as: APRESOLINE  Take 1 tablet (25 mg total) by mouth at bedtime.   ipratropium-albuterol  0.5-2.5 (3) MG/3ML Soln Commonly known as: DUONEB Take 3 mLs by nebulization every 6 (six) hours as needed.   levothyroxine  75 MCG tablet Commonly known as: SYNTHROID  Take 75 mcg by mouth daily.   memantine  10 MG tablet Commonly known as: NAMENDA  Take 1 tablet by mouth 2  (two) times daily.   Sensipar 30 MG tablet Generic drug: cinacalcet Take 30 mg by mouth every Monday, Wednesday, and Friday.   sevelamer  carbonate 800 MG tablet Commonly known as: RENVELA  Take 800 mg by mouth 3 (three) times daily.        Disposition and follow-up:   Mr.Joseph Hebert. was discharged from [facility] in Stable condition.  At the hospital follow up visit please address:  1.  Follow-up:  *Asymptomatic V tach -Patient was started on Coreg 3.125 mg BID, assess compliance -Ensure patient completed Zio patch -Patient needs to have stress test performed as well cardiology follow up  *AVF, LUE -Patient needs to follow up with vascular surgery for his non-functioning AVF -AVF did not have a bruit or thrill, per patient this is not new. No signs of critical limb ischemia.   *HTN -Coreg 3.125 mg BID was added to his regimen   2.  Labs / imaging needed at time of follow-up: TSH, Stress test, RFP  3.  Pending labs/ test needing follow-up: N/A  4.  Medication Changes  STOPPED  -Losartan  (patient not taking)  -Amlodipine   (patient not taking)  -Norco  (patient not taking)   ADDED  -Coreg 3.125 mg BID   MODIFIED  -Hydralazine  25 mg daily (how patient takes this, prescribed as BID)   Follow-up Appointments:  Please go to your appointment with Dr. Sheree, vascular surgeon, on 11/11/2024 at 2:40  PM.    Hospital Course by problem list: Asymptomatic Ventricular Tachycardia, 36 beats In the emergency department, patient had a 36 beat run of asymptomatic ventricular tachycardia.  During this time he remained hemodynamically stable.  Cardiology was consulted who suspects that patient had an electrolyte shift during hemodialysis causing the V. tach. Echocardiogram showed LVEF of 50-55% with mild aortic regurgitation. He was started on Coreg 3.125 and was able to tolerate the medication well overnight. Cardiology discharged him home with plans for a Zio  patch and he needs outpatient stress test.   Chest Pain Resolved.  Based off patient's description of a sharp sudden central chest pain that were short acting without other symptoms, I do suspect that this was MSK in nature from costochondritis versus electrolyte shift during dialysis.  Very low suspicion for ACS given the flat troponins and normal EKG.  PAD s/p bilateral common iliac artery stenting in July 2016, left femoral to above-knee popliteal artery bypass with PTFE, left femoral to above-knee popliteal artery bypass in March 2020 Bilateral lower extremities are warm and dry with palpable dorsalis pedis pulses present bilaterally.  COPD Tobacco use Patient has a history of tobacco use with 1 pack/day.  Patient was unable to give me a pack-year history.  He does not have a official diagnosis of COPD to my knowledge, I am unable to find PFTs on his record. No concern for active exacerbation.  Hypothyroidism Continued home regimen of levothyroxine  75 mcg daily.    HTN Continued home regimen of hydralazine  25 mg nightly, patient started on coreg 3.125 mg BID.   ESRD on HD T, TH, S Patient reports that his hemodialysis session was cut short due to him developing chest pain.  Fortunately he does not have an urgent or emergent need for hemodialysis during hospitalization. On exam, there was no thrill or auscultated bruit of the left upper extremity. I discussed patient's AVF with vascular surgery: will follow up with patient in the outpatient setting as this is not emergent.    T2DM A1c returned at 4.4.   Discharge Subjective:  Enjoying his breakfast and looking forward to going home. He denies palpitations or chest pain. Discussed need for outpatient follow up for Zio patch and with vascular surgery to get his AVF working. Patient understands.   Discharge Exam:   Blood pressure 134/61, pulse 72, temperature 97.8 F (36.6 C), temperature source Oral, resp. rate 16, height 5' 10 (1.778  m), weight 84.9 kg, SpO2 95%.  Physical Exam Vitals reviewed.  Constitutional:      Appearance: He is not ill-appearing.  Eyes:     Extraocular Movements: Extraocular movements intact.  Cardiovascular:     Rate and Rhythm: Normal rate and regular rhythm.     Heart sounds: Normal heart sounds.  Pulmonary:     Effort: Pulmonary effort is normal.  Abdominal:     General: Bowel sounds are normal.     Palpations: Abdomen is soft.  Musculoskeletal:     Comments: Left forearm AVF without thrill or bruit Skin:    General: Skin is warm and dry.     Findings: Ecchymosis present.     Comments: Ecchymosis around non functioning AVF left forearm  Neurological:     General: No focal deficit present.     Mental Status: He is alert and oriented to person, place, and time.  Psychiatric:        Mood and Affect: Mood normal.        Behavior: Behavior  normal.     Pertinent Labs, Studies, and Procedures:     Latest Ref Rng & Units 10/26/2024    4:16 AM 10/25/2024    5:52 AM 10/24/2024    1:57 PM  CBC  WBC 4.0 - 10.5 K/uL 7.2  7.7  7.1   Hemoglobin 13.0 - 17.0 g/dL 89.0  88.4  87.9   Hematocrit 39.0 - 52.0 % 32.6  35.0  37.3   Platelets 150 - 400 K/uL 162  158  138        Latest Ref Rng & Units 10/26/2024    4:16 AM 10/25/2024    5:52 AM 10/24/2024    1:57 PM  CMP  Glucose 70 - 99 mg/dL 85  83  866   BUN 8 - 23 mg/dL 40  29  25   Creatinine 0.61 - 1.24 mg/dL 5.20  5.59  6.03   Sodium 135 - 145 mmol/L 138  136  138   Potassium 3.5 - 5.1 mmol/L 4.4  4.0  3.8   Chloride 98 - 111 mmol/L 102  99  100   CO2 22 - 32 mmol/L 24  25  24    Calcium  8.9 - 10.3 mg/dL 8.5  8.2  8.2     ECHOCARDIOGRAM COMPLETE Result Date: 10/25/2024    ECHOCARDIOGRAM REPORT   Patient Name:   Joseph Hebert. Date of Exam: 10/25/2024 Medical Rec #:  [id number]          Height:       70.0 in Accession #:    [id number]         Weight:       187.2 lb Date of Birth:  Nov 29, 1946          BSA:          2.029 m Patient  Age:    78 years           BP:           166/76 mmHg Patient Gender: M                  HR:           71 bpm. Exam Location:  Inpatient Procedure: 2D Echo, Cardiac Doppler and Color Doppler (Both Spectral and Color            Flow Doppler were utilized during procedure). Indications:    Chest Pain R07.9  History:        Patient has prior history of Echocardiogram examinations, most                 recent 01/27/2017. CAD, PAD, COPD and ESRD, Arrythmias:PVC; Risk                 Factors:Hypertension, Diabetes and Dyslipidemia.  Sonographer:    Thea Norlander RCS Referring Phys: HUSAM M SALAH IMPRESSIONS  1. Left ventricular ejection fraction, by estimation, is 50 to 55%. The left ventricle has low normal function. The left ventricle has no regional wall motion abnormalities. There is moderate left ventricular hypertrophy. Left ventricular diastolic parameters are indeterminate.  2. Right ventricular systolic function is normal. The right ventricular size is normal.  3. The mitral valve is abnormal. Mild mitral valve regurgitation. No evidence of mitral stenosis.  4. The aortic valve was not well visualized. Aortic valve regurgitation is mild.  5. The inferior vena cava is normal in size with greater than 50% respiratory variability, suggesting right atrial pressure of 3 mmHg.  FINDINGS  Left Ventricle: Left ventricular ejection fraction, by estimation, is 50 to 55%. The left ventricle has low normal function. The left ventricle has no regional wall motion abnormalities. The left ventricular internal cavity size was normal in size. There is moderate left ventricular hypertrophy. Left ventricular diastolic parameters are indeterminate. Right Ventricle: The right ventricular size is normal. Right vetricular wall thickness was not well visualized. Right ventricular systolic function is normal. Left Atrium: Left atrial size was normal in size. Right Atrium: Right atrial size was normal in size. Pericardium: Trivial  pericardial effusion is present. The pericardial effusion is circumferential. Mitral Valve: The mitral valve is abnormal. Mild mitral valve regurgitation. No evidence of mitral valve stenosis. Tricuspid Valve: The tricuspid valve is normal in structure. Tricuspid valve regurgitation is trivial. No evidence of tricuspid stenosis. Aortic Valve: The aortic valve was not well visualized. Aortic valve regurgitation is mild. Aortic valve mean gradient measures 4.0 mmHg. Aortic valve peak gradient measures 8.7 mmHg. Aortic valve area, by VTI measures 1.41 cm. Pulmonic Valve: The pulmonic valve was not well visualized. Pulmonic valve regurgitation is not visualized. No evidence of pulmonic stenosis. Aorta: The aortic root and ascending aorta are structurally normal, with no evidence of dilitation. Venous: The inferior vena cava is normal in size with greater than 50% respiratory variability, suggesting right atrial pressure of 3 mmHg. IAS/Shunts: No atrial level shunt detected by color flow Doppler.  LEFT VENTRICLE PLAX 2D LVIDd:         4.90 cm   Diastology LVIDs:         4.00 cm   LV e' medial:    6.31 cm/s LV PW:         1.30 cm   LV E/e' medial:  9.6 LV IVS:        1.20 cm   LV e' lateral:   6.85 cm/s LVOT diam:     2.00 cm   LV E/e' lateral: 8.9 LV SV:         41 LV SV Index:   20 LVOT Area:     3.14 cm  RIGHT VENTRICLE             IVC RV S prime:     17.30 cm/s  IVC diam: 1.65 cm TAPSE (M-mode): 2.0 cm LEFT ATRIUM           Index        RIGHT ATRIUM           Index LA diam:      4.00 cm 1.97 cm/m   RA Area:     13.55 cm LA Vol (A2C): 47.8 ml 23.55 ml/m  RA Volume:   31.35 ml  15.45 ml/m LA Vol (A4C): 52.3 ml 25.77 ml/m  AORTIC VALVE AV Area (Vmax):    1.26 cm AV Area (Vmean):   1.26 cm AV Area (VTI):     1.41 cm AV Vmax:           147.86 cm/s AV Vmean:          93.020 cm/s AV VTI:            0.288 m AV Peak Grad:      8.7 mmHg AV Mean Grad:      4.0 mmHg LVOT Vmax:         59.20 cm/s LVOT Vmean:         37.200 cm/s LVOT VTI:          0.129 m LVOT/AV  VTI ratio: 0.45  AORTA Ao Root diam: 3.30 cm Ao Asc diam:  3.10 cm MITRAL VALVE               TRICUSPID VALVE MV Area (PHT): 3.65 cm    TR Peak grad:   35.8 mmHg MV Decel Time: 208 msec    TR Vmax:        299.00 cm/s MV E velocity: 60.80 cm/s MV A velocity: 68.10 cm/s  SHUNTS MV E/A ratio:  0.89        Systemic VTI:  0.13 m                            Systemic Diam: 2.00 cm Dorn Ross MD Electronically signed by Dorn Ross MD Signature Date/Time: 10/25/2024/3:10:21 PM    Final    DG Chest 2 View Result Date: 10/24/2024 CLINICAL DATA:  Chest pain. EXAM: CHEST - 2 VIEW COMPARISON:  02/15/2024. FINDINGS: The heart size and mediastinal contours are within normal limits. There is atherosclerotic calcification aorta. No consolidation, effusion, or pneumothorax is seen. A right internal jugular central venous catheter terminates over the right atrium. No acute osseous abnormality. IMPRESSION: No active cardiopulmonary disease. Electronically Signed   By: Leita Birmingham M.D.   On: 10/24/2024 14:52     Discharge Instructions: Discharge Instructions     Call MD for:  difficulty breathing, headache or visual disturbances   Complete by: As directed    Call MD for:  extreme fatigue   Complete by: As directed    Call MD for:  hives   Complete by: As directed    Call MD for:  persistant dizziness or light-headedness   Complete by: As directed    Call MD for:  persistant nausea and vomiting   Complete by: As directed    Call MD for:  redness, tenderness, or signs of infection (pain, swelling, redness, odor or green/yellow discharge around incision site)   Complete by: As directed    Call MD for:  severe uncontrolled pain   Complete by: As directed    Call MD for:  temperature >100.4   Complete by: As directed    Diet - low sodium heart healthy   Complete by: As directed    Discharge instructions   Complete by: As directed    You came to the hospital  for chest pain and you were diagnosed with an irregular rhythm called ventricular tachycardia.  We treated you with Coreg.    *For your Ventricular Tachycardia  -We have started you on these following medications:  -Coreg (carvedilol) 3.125 mg twice a day   -Please see PCP in 7 to 10 days -You will have a ZIO patch placed with cardiology in the outpatient setting: this is a heart monitor.   We have continued your home medications as you were taking them.    Follow-up appointments: Please visit your family doctor in 7 to 10 days Please follow up with the cardiology office, they will call and set this up with you.   If you have any questions or concerns please feel free to call: Internal medicine clinic at [phone]   If you have any of these following symptoms, please call us  or seek care at an emergency department: -Chest Pain -Difficulty Breathing -Syncope (passing out) -Drooping of face -Slurred speech -Sudden weakness in your leg or arm -Fever -Chills   We are glad that you are feeling better, it was  a pleasure to care for you!  Damien Lease DO   Increase activity slowly   Complete by: As directed        Signed: Damien Lease DO Viktoria King DO [facility] Internal Medicine - PGY2, PGY1 10/26/2024, 10:17 AM   Please contact the on call pager at: [phone]

## 2024-10-26 NOTE — Progress Notes (Signed)
 30d monitor ordered for PVCs/NSVT Dr. Sheena to read

## 2024-10-27 DIAGNOSIS — N186 End stage renal disease: Secondary | ICD-10-CM | POA: Diagnosis not present

## 2024-10-27 DIAGNOSIS — Z992 Dependence on renal dialysis: Secondary | ICD-10-CM | POA: Diagnosis not present

## 2024-10-29 DIAGNOSIS — Z992 Dependence on renal dialysis: Secondary | ICD-10-CM | POA: Diagnosis not present

## 2024-10-29 DIAGNOSIS — N2581 Secondary hyperparathyroidism of renal origin: Secondary | ICD-10-CM | POA: Diagnosis not present

## 2024-10-29 DIAGNOSIS — N186 End stage renal disease: Secondary | ICD-10-CM | POA: Diagnosis not present

## 2024-10-30 DIAGNOSIS — I679 Cerebrovascular disease, unspecified: Secondary | ICD-10-CM | POA: Diagnosis not present

## 2024-10-30 DIAGNOSIS — F0390 Unspecified dementia without behavioral disturbance: Secondary | ICD-10-CM | POA: Diagnosis not present

## 2024-10-31 DIAGNOSIS — Z992 Dependence on renal dialysis: Secondary | ICD-10-CM | POA: Diagnosis not present

## 2024-10-31 DIAGNOSIS — N2581 Secondary hyperparathyroidism of renal origin: Secondary | ICD-10-CM | POA: Diagnosis not present

## 2024-10-31 DIAGNOSIS — N186 End stage renal disease: Secondary | ICD-10-CM | POA: Diagnosis not present

## 2024-11-01 DIAGNOSIS — F0390 Unspecified dementia without behavioral disturbance: Secondary | ICD-10-CM | POA: Diagnosis not present

## 2024-11-02 DIAGNOSIS — Z992 Dependence on renal dialysis: Secondary | ICD-10-CM | POA: Diagnosis not present

## 2024-11-02 DIAGNOSIS — N2581 Secondary hyperparathyroidism of renal origin: Secondary | ICD-10-CM | POA: Diagnosis not present

## 2024-11-02 DIAGNOSIS — I493 Ventricular premature depolarization: Secondary | ICD-10-CM | POA: Diagnosis not present

## 2024-11-02 DIAGNOSIS — N186 End stage renal disease: Secondary | ICD-10-CM | POA: Diagnosis not present

## 2024-11-05 DIAGNOSIS — N2581 Secondary hyperparathyroidism of renal origin: Secondary | ICD-10-CM | POA: Diagnosis not present

## 2024-11-05 DIAGNOSIS — N186 End stage renal disease: Secondary | ICD-10-CM | POA: Diagnosis not present

## 2024-11-05 DIAGNOSIS — Z992 Dependence on renal dialysis: Secondary | ICD-10-CM | POA: Diagnosis not present

## 2024-11-09 NOTE — Progress Notes (Signed)
 Cardiology Office Note    Date:  11/16/2024  ID:  [name], DOB [birth year], MRN [id number] PCP:  Keren Vicenta BRAVO, MD  Cardiologist:  Dub Huntsman, DO  Electrophysiologist:  None   Chief Complaint: Follow up for NSVT   History of Present Illness: .    [name] is a 78 y.o. male with visit-pertinent history of ESRD on HD, hypertension, type 2 diabetes mellitus, COPD, PAD s/p bilateral common iliac stents, hypothyroidism, tobacco use and history of bradycardia-asystolic cardiac arrest in 01/2017 following anterior cervical fusion (thought to be related to vagotonia +/- metoprolol  intake).   On 10/25/2024 cardiology was consulted for an episode of chest pain during dialysis and a run of NSVT.  Patient presented with brief episode of chest pain during dialysis at [facility] 29[facility]28.  Patient denied any chest pain during his admission.  Echo indicated LVEF of 50 to 55%, no RWMA, normal RV, mild MR.  While in the ED patient had an episode of NSVT lasting 36 beats, was started on carvedilol  3.125 mg twice daily and tolerated well inpatient.  ZIO monitor was placed at discharge.  Today presents for follow-up with his wife.  He reports that he has been doing well overall.  Patient denies any chest pain, notes some intermittent shortness of breath, reports that he has been recovering from a cold has been noting improvement.  He denies any palpitations, presyncope or syncope.  He denies any increased lower extremity edema, orthopnea or PND.  Patient reports that he does not walk, is able to stand with assistance.  He attends dialysis on Tuesday, Thursday and Saturday.  Patient notes that he is a poor historian, per his wife he has Alzheimer's, she assist primarily with history.  Patient's cardiac monitor was placed on Saturday. ROS: .   Today he denies chest pain, shortness of breath, lower extremity edema, fatigue, palpitations, melena, hematuria, hemoptysis, diaphoresis, weakness, presyncope,  syncope, orthopnea, and PND.  All other systems are reviewed and otherwise negative. Studies Reviewed: SABRA   EKG:  EKG is ordered today, personally reviewed, demonstrating  EKG Interpretation Date/Time:  Monday November 16 2024 13:25:04 EST Ventricular Rate:  70 PR Interval:  144 QRS Duration:  90 QT Interval:  414 QTC Calculation: 447 R Axis:   21  Text Interpretation: Sinus rhythm with occasional Premature ventricular complexes Nonspecific T wave abnormality T wave inversion lateral lead  Confirmed by Mykela Mewborn [id number] on 11/16/2024 2:31:45 PM   CV Studies: Cardiac studies reviewed are outlined and summarized above. Otherwise please see EMR for full report. Cardiac Studies & Procedures   ______________________________________________________________________________________________   STRESS TESTS  MYOCARDIAL PERFUSION IMAGING 01/06/2019  Interpretation Summary  There was no ST segment deviation noted during stress.  There were frequent PVCs, ventricular couplets and ventricular salvo.  The perfusion study is normal.  Gated images not performed due to frequent ectopy. Consider 2D echo to assess further.  This is a low risk study.   ECHOCARDIOGRAM  ECHOCARDIOGRAM COMPLETE 10/25/2024  Narrative ECHOCARDIOGRAM REPORT    Patient Name:   Joseph Hebert. Date of Exam: 10/25/2024 Medical Rec #:  [id number]          Height:       70.0 in Accession #:    [id number]         Weight:       187.2 lb Date of Birth:  [birth year]          BSA:  2.029 m Patient Age:    78 years           BP:           166/76 mmHg Patient Gender: M                  HR:           71 bpm. Exam Location:  Inpatient  Procedure: 2D Echo, Cardiac Doppler and Color Doppler (Both Spectral and Color Flow Doppler were utilized during procedure).  Indications:    Chest Pain R07.9  History:        Patient has prior history of Echocardiogram examinations, most recent 01/27/2017. CAD, PAD, COPD and  ESRD, Arrythmias:PVC; Risk Factors:Hypertension, Diabetes and Dyslipidemia.  Sonographer:    Thea Norlander RCS Referring Phys: HUSAM M SALAH  IMPRESSIONS   1. Left ventricular ejection fraction, by estimation, is 50 to 55%. The left ventricle has low normal function. The left ventricle has no regional wall motion abnormalities. There is moderate left ventricular hypertrophy. Left ventricular diastolic parameters are indeterminate. 2. Right ventricular systolic function is normal. The right ventricular size is normal. 3. The mitral valve is abnormal. Mild mitral valve regurgitation. No evidence of mitral stenosis. 4. The aortic valve was not well visualized. Aortic valve regurgitation is mild. 5. The inferior vena cava is normal in size with greater than 50% respiratory variability, suggesting right atrial pressure of 3 mmHg.  FINDINGS Left Ventricle: Left ventricular ejection fraction, by estimation, is 50 to 55%. The left ventricle has low normal function. The left ventricle has no regional wall motion abnormalities. The left ventricular internal cavity size was normal in size. There is moderate left ventricular hypertrophy. Left ventricular diastolic parameters are indeterminate.  Right Ventricle: The right ventricular size is normal. Right vetricular wall thickness was not well visualized. Right ventricular systolic function is normal.  Left Atrium: Left atrial size was normal in size.  Right Atrium: Right atrial size was normal in size.  Pericardium: Trivial pericardial effusion is present. The pericardial effusion is circumferential.  Mitral Valve: The mitral valve is abnormal. Mild mitral valve regurgitation. No evidence of mitral valve stenosis.  Tricuspid Valve: The tricuspid valve is normal in structure. Tricuspid valve regurgitation is trivial. No evidence of tricuspid stenosis.  Aortic Valve: The aortic valve was not well visualized. Aortic valve regurgitation is mild.  Aortic valve mean gradient measures 4.0 mmHg. Aortic valve peak gradient measures 8.7 mmHg. Aortic valve area, by VTI measures 1.41 cm.  Pulmonic Valve: The pulmonic valve was not well visualized. Pulmonic valve regurgitation is not visualized. No evidence of pulmonic stenosis.  Aorta: The aortic root and ascending aorta are structurally normal, with no evidence of dilitation.  Venous: The inferior vena cava is normal in size with greater than 50% respiratory variability, suggesting right atrial pressure of 3 mmHg.  IAS/Shunts: No atrial level shunt detected by color flow Doppler.   LEFT VENTRICLE PLAX 2D LVIDd:         4.90 cm   Diastology LVIDs:         4.00 cm   LV e' medial:    6.31 cm/s LV PW:         1.30 cm   LV E/e' medial:  9.6 LV IVS:        1.20 cm   LV e' lateral:   6.85 cm/s LVOT diam:     2.00 cm   LV E/e' lateral: 8.9 LV SV:  41 LV SV Index:   20 LVOT Area:     3.14 cm   RIGHT VENTRICLE             IVC RV S prime:     17.30 cm/s  IVC diam: 1.65 cm TAPSE (M-mode): 2.0 cm  LEFT ATRIUM           Index        RIGHT ATRIUM           Index LA diam:      4.00 cm 1.97 cm/m   RA Area:     13.55 cm LA Vol (A2C): 47.8 ml 23.55 ml/m  RA Volume:   31.35 ml  15.45 ml/m LA Vol (A4C): 52.3 ml 25.77 ml/m AORTIC VALVE AV Area (Vmax):    1.26 cm AV Area (Vmean):   1.26 cm AV Area (VTI):     1.41 cm AV Vmax:           147.86 cm/s AV Vmean:          93.020 cm/s AV VTI:            0.288 m AV Peak Grad:      8.7 mmHg AV Mean Grad:      4.0 mmHg LVOT Vmax:         59.20 cm/s LVOT Vmean:        37.200 cm/s LVOT VTI:          0.129 m LVOT/AV VTI ratio: 0.45  AORTA Ao Root diam: 3.30 cm Ao Asc diam:  3.10 cm  MITRAL VALVE               TRICUSPID VALVE MV Area (PHT): 3.65 cm    TR Peak grad:   35.8 mmHg MV Decel Time: 208 msec    TR Vmax:        299.00 cm/s MV E velocity: 60.80 cm/s MV A velocity: 68.10 cm/s  SHUNTS MV E/A ratio:  0.89        Systemic VTI:   0.13 m Systemic Diam: 2.00 cm  Dorn Ross MD Electronically signed by Dorn Ross MD Signature Date/Time: 10/25/2024/3:10:21 PM    Final          ______________________________________________________________________________________________      Current Reported Medications:.    Current Meds  Medication Sig   aspirin  EC 81 MG tablet Take 81 mg by mouth daily. Swallow whole.   atorvastatin  (LIPITOR ) 80 MG tablet Take 80 mg by mouth at bedtime. (2100)   donepezil (ARICEPT) 5 MG tablet Take 5 mg by mouth at bedtime.   Febuxostat  80 MG TABS Take 80 mg by mouth at bedtime.   gabapentin  (NEURONTIN ) 300 MG capsule Take 300 mg by mouth at bedtime.   hydrALAZINE  (APRESOLINE ) 25 MG tablet Take 1 tablet (25 mg total) by mouth at bedtime.   levothyroxine  (SYNTHROID ) 75 MCG tablet Take 75 mcg by mouth daily.   memantine  (NAMENDA ) 10 MG tablet Take 1 tablet by mouth 2 (two) times daily.   SENSIPAR 30 MG tablet Take 30 mg by mouth every Monday, Wednesday, and Friday.   sevelamer  carbonate (RENVELA ) 800 MG tablet Take 800 mg by mouth 3 (three) times daily.   [DISCONTINUED] carvedilol  (COREG ) 3.125 MG tablet Take 1 tablet (3.125 mg total) by mouth 2 (two) times daily with a meal.    Physical Exam:    VS:  BP 116/78   Pulse 70   Ht 5' 10 (1.778 m)   Wt  186 lb (84.4 kg)   SpO2 95%   BMI 26.69 kg/m    Wt Readings from Last 3 Encounters:  11/16/24 186 lb (84.4 kg)  10/24/24 187 lb 2.7 oz (84.9 kg)  07/06/24 186 lb (84.4 kg)    GEN: Well nourished, well developed in no acute distress NECK: No JVD; No carotid bruits CARDIAC: RRR, no murmurs, rubs, gallops RESPIRATORY:  Clear to auscultation without rales, wheezing or rhonchi  ABDOMEN: Soft, non-tender, non-distended EXTREMITIES:  No edema; No acute deformity     Asessement and Plan:.    Chest pain: Patient with an episode of chest pain during dialysis earlier in the month.  Patient presented to the ED with mildly elevated  but flat troponins consistent with dialysis.  Echo indicated LVEF 50 to 55%, no RWMA, normal RV, mild MR. Today he denies chest pain, notes some occasional shortness of breath in setting of recent upper respiratory illness.  Patient's EKG with nonspecific T wave abnormalities, T wave inversions in lateral leads.  Discussed cardiac PET stress today with patient and his wife, they deferred ischemic evaluation at this time in favor of completing cardiac monitor.  We did review the risks and benefits of ischemic evaluation with cardiac PET stress and discussed cardiac catheterization, patient again deferred at this time, they would like to further discuss after monitor has resulted.  Reviewed ED precautions.  NSVT/pVCs: Patient with episode of NSVT while in the ED lasting 36 beats.  He was started on carvedilol  3.125 mg twice daily.  Cardiac monitor results are currently pending. Patient with allergy listed to metoprolol , history of bradycardia.  Patient believes that he has been tolerating carvedilol , his patient reports that he has not reported any increased dizziness or lightheadedness.  Discussed ischemic evaluation as noted above, deferred at this time.  Reviewed ED precautions.  ESRD: On HD Tuesday, Thursday and Saturday.  Followed by dialysis.  HTN: Blood pressure today 116/78.  Continue carvedilol  3.125 mg twice daily, hydralazine  nightly.  PAD: Followed by vascular surgery.    Disposition: F/u in [place] office in 4 weeks per patient request (location).   Signed, Malvika Tung D Dylin Ihnen, NP

## 2024-11-11 ENCOUNTER — Ambulatory Visit (HOSPITAL_COMMUNITY)

## 2024-11-11 ENCOUNTER — Ambulatory Visit: Admitting: Vascular Surgery

## 2024-11-14 DIAGNOSIS — N2581 Secondary hyperparathyroidism of renal origin: Secondary | ICD-10-CM | POA: Diagnosis not present

## 2024-11-14 DIAGNOSIS — N186 End stage renal disease: Secondary | ICD-10-CM | POA: Diagnosis not present

## 2024-11-15 DIAGNOSIS — N186 End stage renal disease: Secondary | ICD-10-CM | POA: Diagnosis not present

## 2024-11-15 DIAGNOSIS — Z992 Dependence on renal dialysis: Secondary | ICD-10-CM | POA: Diagnosis not present

## 2024-11-15 DIAGNOSIS — E1122 Type 2 diabetes mellitus with diabetic chronic kidney disease: Secondary | ICD-10-CM | POA: Diagnosis not present

## 2024-11-16 ENCOUNTER — Ambulatory Visit: Attending: Cardiology | Admitting: Cardiology

## 2024-11-16 ENCOUNTER — Encounter: Payer: Self-pay | Admitting: Cardiology

## 2024-11-16 VITALS — BP 116/78 | HR 70 | Ht 70.0 in | Wt 186.0 lb

## 2024-11-16 DIAGNOSIS — I10 Essential (primary) hypertension: Secondary | ICD-10-CM | POA: Diagnosis not present

## 2024-11-16 DIAGNOSIS — I493 Ventricular premature depolarization: Secondary | ICD-10-CM | POA: Diagnosis not present

## 2024-11-16 DIAGNOSIS — Z992 Dependence on renal dialysis: Secondary | ICD-10-CM | POA: Diagnosis not present

## 2024-11-16 DIAGNOSIS — N186 End stage renal disease: Secondary | ICD-10-CM

## 2024-11-16 DIAGNOSIS — I739 Peripheral vascular disease, unspecified: Secondary | ICD-10-CM

## 2024-11-16 MED ORDER — CARVEDILOL 3.125 MG PO TABS: 3.1250 mg | ORAL_TABLET | Freq: Two times a day (BID) | ORAL | 3 refills | Status: AC

## 2024-11-16 NOTE — Patient Instructions (Signed)
 Medication Instructions:  Your physician recommends that you continue on your current medications as directed. Please refer to the Current Medication list given to you today.  *If you need a refill on your cardiac medications before your next appointment, please call your pharmacy*  Lab Work: None  If you have labs (blood work) drawn today and your tests are completely normal, you will receive your results only by: MyChart Message (if you have MyChart) OR A paper copy in the mail If you have any lab test that is abnormal or we need to change your treatment, we will call you to review the results.  Testing/Procedures: None   Follow-Up: At [facility], you and your health needs are our priority.  As part of our continuing mission to provide you with exceptional heart care, our providers are all part of one team.  This team includes your primary Cardiologist (physician) and Advanced Practice Providers or APPs (Physician Assistants and Nurse Practitioners) who all work together to provide you with the care you need, when you need it.  Your next appointment:   4 - 6 weeks    Provider:    One of the providers at the [facility] location.   We recommend signing up for the patient portal called MyChart.  Sign up information is provided on this After Visit Summary.  MyChart is used to connect with patients for Virtual Visits (Telemedicine).  Patients are able to view lab/test results, encounter notes, upcoming appointments, etc.  Non-urgent messages can be sent to your provider as well.   To learn more about what you can do with MyChart, go to [email or web address].   Other Instructions None

## 2024-11-30 ENCOUNTER — Telehealth: Payer: Self-pay | Admitting: Cardiology

## 2024-11-30 NOTE — Telephone Encounter (Signed)
° °  Cardiac Monitor Alert  Date of alert:  11/30/2024   Patient Name: Joseph Hebert.  DOB: 11/20/1946  MRN: [id number]   [facility] Cardiologist: Kardie Tobb, DO  [facility] EP:  None    Monitor Information: Cardiac Event Monitor [[employer]]  Reason:  PVC Ordering provider:  Kardie Tobb   Alert Atrial Fibrillation/Flutter This is the 1st alert for this rhythm.  The patient has no hx of Atrial Fibrillation/Flutter.  The patient is not currently on anticoagulation.  Next Cardiology Appointment   Date:  1/14  Provider:  Krasowski  The patient could NOT be reached by telephone today.  Left message to return the call  Plans: A fib run was 17 secs and 14 secs. Continue to follow up with Dr. Krasowski as scheduled.   Other: Auto trigger 2:27 am New onset a fib 154 bpm  Rolin CHRISTELLA Qual[place], [place]  11/30/2024 9:21 AM

## 2024-11-30 NOTE — Telephone Encounter (Signed)
 Joseph Hebert calling to report critical results please advise

## 2024-11-30 NOTE — Telephone Encounter (Signed)
 Spoke with pt and his wife. Pt denies feeling anything at 2:26 this morning. He states he was probably watching tv at that time.

## 2024-12-02 ENCOUNTER — Encounter: Payer: Self-pay | Admitting: Vascular Surgery

## 2024-12-02 ENCOUNTER — Ambulatory Visit (HOSPITAL_COMMUNITY): Admission: RE | Admit: 2024-12-02 | Discharge: 2024-12-02 | Attending: Vascular Surgery

## 2024-12-02 ENCOUNTER — Ambulatory Visit: Admitting: Vascular Surgery

## 2024-12-02 VITALS — BP 118/75 | HR 75 | Temp 97.9°F

## 2024-12-02 DIAGNOSIS — N186 End stage renal disease: Secondary | ICD-10-CM

## 2024-12-02 DIAGNOSIS — Z992 Dependence on renal dialysis: Secondary | ICD-10-CM

## 2024-12-02 NOTE — Progress Notes (Signed)
 Patient ID: Joseph LELON Julius Mickey., male   DOB: 06-17-1946, 78 y.o.   MRN: [id number]  Reason for Consult: No chief complaint on file.   Referred by Keren Vicenta BRAVO, MD  Subjective:     HPI:  Joseph Sturgell. is a 78 y.o. male with history of end-stage renal disease previously on dialysis via left upper arm cephalic vein fistula which underwent revision with superficialization and branch ligation.  More recently underwent balloon angioplasty of the cephalic vein in the upper arm.  Fistula is now known to be occluded and he is dialyzing via catheter.  Currently dialyzing Tuesdays, Thursdays and Saturdays.  Past Medical History:  Diagnosis Date   Acute encephalopathy    Acute hypoxemic respiratory failure (HCC)    Acute pulmonary edema (HCC)    Acute respiratory failure with hypoxia (HCC)    Anemia    Arrhythmia    PVCs; takes metoprolol  daily   Arthritis    Asthma    Bilateral leg pain 06/16/2015   CAD (coronary artery disease)    minimal, nonobstructive   Cardiac asystole (HCC)    after back surgery in 2018   Cardiogenic shock (HCC)    Carotid artery occlusion    s/p CEA in 2011   Chronic back pain    s/p lumbar fusion   COPD (chronic obstructive pulmonary disease) (HCC)    Spirva daily and Albuterol  as needed   Dementia (HCC)    Dementia (HCC)    Depression    Diabetes mellitus without complication (HCC)    Type 2 diet controlled.Never been on meds, does not check blood sugar   ESRD (end stage renal disease) (HCC)    t-th-sat dialysis   GERD (gastroesophageal reflux disease)    Gout    takes Uloric  daily   HCAP (healthcare-associated pneumonia)    Head injury    as a child   History of colon polyps    benign   History of gastric ulcer    History of hiatal hernia    Hyperlipidemia    takes Atorvastatin  and Fenofibrate  daily   Hypertension    takes Lisinopril ,Amlodipine ,and Hydralazine  daily   Intermittent claudication 07/18/2015   s/p LLE bypass    Lumbosacral spondylosis with radiculopathy 02/17/2016   Nevus of choroid of left eye 12/08/2014   Numbness    lower left leg and upper right thigh   PAD (peripheral artery disease)    Pneumonia    hx of-couple of yrs ago   S/P arteriovenous (AV) fistula creation - Left UE. 02-14-2024. 02/14/2024   Serous retinal detachment of left eye 12/08/2014   Shortness of breath dyspnea    with exertion   Status post intraocular lens implant 12/08/2014   Tuberculosis 2006    9 months    Vertigo    Wears glasses    Family History  Problem Relation Age of Onset   Diabetes Mother    Hyperlipidemia Father    Hypertension Father    Other Father        Right Leg Amputation   Heart disease Sister        Aneyrism    Past Surgical History:  Procedure Laterality Date   A/V SHUNT INTERVENTION N/A 05/15/2024   Procedure: A/V SHUNT INTERVENTION;  Surgeon: Serene Gaile LELON, MD;  Location: [facility];  Service: Cardiovascular;  Laterality: N/A;   A/V SHUNT INTERVENTION Left 08/05/2024   Procedure: A/V SHUNT INTERVENTION;  Surgeon: Pearline Norman RAMAN, MD;  Location: [facility];  Service: Cardiovascular;  Laterality: Left;   ABDOMINAL AORTOGRAM W/LOWER EXTREMITY N/A 02/20/2019   Procedure: ABDOMINAL AORTOGRAM W/LOWER EXTREMITY;  Surgeon: Harvey Carlin BRAVO, MD;  Location: [facility];  Service: Cardiovascular;  Laterality: N/A;   ABDOMINAL EXPOSURE N/A 01/22/2017   Procedure: ABDOMINAL EXPOSURE;  Surgeon: Penne Lonni [place], MD;  Location: [facility] OR;  Service: Vascular;  Laterality: N/A;   ANTERIOR LAT LUMBAR FUSION N/A 01/22/2017   Procedure: LUMBAR THREE-FOUR, LUMBAR FOUR-FIVE ANTERIOR LATERAL INTERBODY FUSION;  Surgeon: Morene Hicks Ditty, MD;  Location: [facility];  Service: Neurosurgery;  Laterality: N/A;  L3-4 L4-5 Lateral interbody fusion   ANTERIOR LUMBAR FUSION N/A 01/22/2017   Procedure: LUMBAR FIVE-SACRUM ONE ANTERIOR LUMBAR INTERBODY FUSION WITH ABDOMINAL APPROACH BY DR COLORADO;  Surgeon:  Morene Hicks Ditty, MD;  Location: [facility] OR;  Service: Neurosurgery;  Laterality: N/A;   AV FISTULA PLACEMENT Left 02/14/2024   Procedure: LEFT ARM BRACHIOCEPHALIC ARTERIOVENOUS FISTULA CREATION;  Surgeon: [place] Penne Lonni, MD;  Location: [facility] OR;  Service: Vascular;  Laterality: Left;   CAROTID BODY TUMOR EXCISION Left 09/21/2015   Procedure: EXCISE LEFT NECK NODULE WITH LOCAL ;  Surgeon: Carlin BRAVO Harvey, MD;  Location: [facility] OR;  Service: Vascular;  Laterality: Left;   CAROTID ENDARTERECTOMY  10/31/2010   LEFT cea   cataract surgery Bilateral    COLONOSCOPY  10/1997   in [facility]   EYE SURGERY     FEMORAL-POPLITEAL BYPASS GRAFT Left 08/23/2015   Procedure: LEFT FEMORAL-POPLITEAL ARTERY BYPASS WITH GORETEX GRAFT;  Surgeon: Carlin BRAVO Harvey, MD;  Location: [facility] OR;  Service: Vascular;  Laterality: Left;   FEMORAL-POPLITEAL BYPASS GRAFT Left 02/23/2019   Procedure: REDO LEFT FEMORAL TO POPLITEAL ARTERY BYPASS GRAFT Using Propaten graft;  Surgeon: Harvey Carlin BRAVO, MD;  Location: [facility] OR;  Service: Vascular;  Laterality: Left;   FISTULA SUPERFICIALIZATION Left 06/10/2024   Procedure: FISTULA SUPERFICIALIZATION;  Surgeon: Serene Gaile ORN, MD;  Location: [facility];  Service: Vascular;  Laterality: Left;   IR FLUORO GUIDE CV LINE RIGHT  02/12/2024   IR THORACENTESIS ASP PLEURAL SPACE W/IMG GUIDE  02/13/2024   IR US  GUIDE VASC ACCESS RIGHT  02/12/2024   JOINT REPLACEMENT  1980   RIGHT  knee   LIGATION OF COMPETING BRANCHES OF ARTERIOVENOUS FISTULA Left 06/10/2024   Procedure: LIGATION OF COMPETING BRANCHES OF ARTERIOVENOUS FISTULA;  Surgeon: Serene Gaile ORN, MD;  Location: [facility];  Service: Vascular;  Laterality: Left;   LUMBAR EPIDURAL INJECTION     LUMBAR LAMINECTOMY/DECOMPRESSION MICRODISCECTOMY Right 02/17/2016   Procedure: Laminectomy and Foraminotomy - Lumbar four-five right;  Surgeon: Morene Hicks Ditty, MD;  Location: [facility];  Service: Neurosurgery;  Laterality: Right;  right   LUMBAR  PERCUTANEOUS PEDICLE SCREW 3 LEVEL N/A 01/22/2017   Procedure: LUMBAR THREE-SACRAL ONE PERCUTANEOUS PEDICLE SCREW FIXATION WITH ROBOTIC ASSISTANCE;  Surgeon: Morene Hicks Ditty, MD;  Location: [facility] OR;  Service: Neurosurgery;  Laterality: N/A;  L3 to S1 Percutaneous pedicle screw fixation   PERIPHERAL VASCULAR CATHETERIZATION N/A 06/24/2015   Procedure: Abdominal Aortogram;  Surgeon: Carlin BRAVO Harvey, MD;  Location: [facility] INVASIVE CV LAB;  Service: Cardiovascular;  Laterality: N/A;   Stents  08/08/1998   Bilateral iliofemoral  stents, [facility]   TONSILLECTOMY     VENOUS ANGIOPLASTY  05/15/2024   Procedure: VENOUS ANGIOPLASTY;  Surgeon: Serene Gaile ORN, MD;  Location: [facility];  Service: Cardiovascular;;   VENOUS ANGIOPLASTY  08/05/2024   Procedure: VENOUS ANGIOPLASTY;  Surgeon: Pearline Norman RAMAN, MD;  Location: [facility];  Service: Cardiovascular;;    Short Social History:  Social History   Tobacco Use   Smoking status: Every Day    Current packs/day: 1.00    Types: Cigarettes   Smokeless tobacco: Never   Tobacco comments:    1ppd  Substance Use Topics   Alcohol  use: No    Alcohol /week: 0.0 standard drinks of alcohol     Allergies[1]  Current Outpatient Medications  Medication Sig Dispense Refill   aspirin  EC 81 MG tablet Take 81 mg by mouth daily. Swallow whole.     atorvastatin  (LIPITOR ) 80 MG tablet Take 80 mg by mouth at bedtime. (2100)     carvedilol  (COREG ) 3.125 MG tablet Take 1 tablet (3.125 mg total) by mouth 2 (two) times daily with a meal. 180 tablet 3   donepezil (ARICEPT) 5 MG tablet Take 5 mg by mouth at bedtime.     Febuxostat  80 MG TABS Take 80 mg by mouth at bedtime.     gabapentin  (NEURONTIN ) 300 MG capsule Take 300 mg by mouth at bedtime.     hydrALAZINE  (APRESOLINE ) 25 MG tablet Take 1 tablet (25 mg total) by mouth at bedtime. 30 tablet 0   levothyroxine  (SYNTHROID ) 75 MCG tablet Take 75 mcg by mouth daily.     memantine  (NAMENDA ) 10 MG tablet Take 1 tablet  by mouth 2 (two) times daily.     SENSIPAR 30 MG tablet Take 30 mg by mouth every Monday, Wednesday, and Friday.     sevelamer  carbonate (RENVELA ) 800 MG tablet Take 800 mg by mouth 3 (three) times daily.     No current facility-administered medications for this visit.    Review of Systems  Constitutional:  Constitutional negative. HENT: HENT negative.  Eyes: Eyes negative.  Respiratory: Respiratory negative.  Cardiovascular: Cardiovascular negative.  GI: Gastrointestinal negative.  Musculoskeletal: Musculoskeletal negative.  Skin: Skin negative.  Neurological: Neurological negative. Hematologic: Hematologic/lymphatic negative.  Psychiatric: Psychiatric negative.        Objective:  Objective   Vitals:   12/02/24 1459  BP: 118/75  Pulse: 75  Temp: 97.9 F (36.6 C)  SpO2: 95%      Physical Exam HENT:     Head: Normocephalic.     Nose: Nose normal.  Eyes:     Pupils: Pupils are equal, round, and reactive to light.  Neck:     Comments: Right IJ catheter in place no evidence of infection Cardiovascular:     Rate and Rhythm: Normal rate.     Pulses: Normal pulses.  Pulmonary:     Effort: Pulmonary effort is normal.  Abdominal:     General: Abdomen is flat.  Musculoskeletal:        General: Normal range of motion.     Cervical back: Normal range of motion.  Skin:    General: Skin is warm.     Capillary Refill: Capillary refill takes less than 2 seconds.  Neurological:     General: No focal deficit present.     Mental Status: He is alert.  Psychiatric:        Mood and Affect: Mood normal.     Data: Right Pre-Dialysis Findings:  +-----------------------+----------+--------------------+---------+--------  +  Location              PSV (cm/s)Intralum. Diam. (cm)Waveform  Comments  +-----------------------+----------+--------------------+---------+--------  +  Brachial Antecub. [id number]        0.40  biphasic             +-----------------------+----------+--------------------+---------+--------  +  Radial Art at Wrist    34        0.23                triphasic           +-----------------------+----------+--------------------+---------+--------  +  Ulnar Art at Wrist     32        0.32                triphasic           +-----------------------+----------+--------------------+---------+--------     Left Pre-Dialysis Findings:  +-----------------------+----------+--------------------+----------+-------  -+  Location              PSV (cm/s)Intralum. Diam. (cm)Waveform   Comments  +-----------------------+----------+--------------------+----------+-------  -+  Brachial Antecub. [id number]        0.53                triphasic            +-----------------------+----------+--------------------+----------+-------  -+  Radial Art at Wrist    27        0.19                monophasicbrisk      +-----------------------+----------+--------------------+----------+-------  -+  Ulnar Art at Wrist     74        0.24                triphasic            +-----------------------+----------+--------------------+----------+-------  -+   Right Cephalic   Diameter (cm)Depth (cm)   Findings     +-----------------+-------------+----------+--------------+  Prox upper arm       0.14                               +-----------------+-------------+----------+--------------+  Mid upper arm        14.00                              +-----------------+-------------+----------+--------------+  Dist upper arm                          not visualized  +-----------------+-------------+----------+--------------+  Antecubital fossa                       not visualized  +-----------------+-------------+----------+--------------+  Prox forearm         0.17                               +-----------------+-------------+----------+--------------+  Mid forearm           0.14                               +-----------------+-------------+----------+--------------+  Dist forearm         0.15                               +-----------------+-------------+----------+--------------+   +-----------------+-------------+----------+--------------+  Right Basilic    Diameter (cm)Depth (cm)   Findings     +-----------------+-------------+----------+--------------+  Prox upper arm       0.27                               +-----------------+-------------+----------+--------------+  Mid upper arm        0.26                               +-----------------+-------------+----------+--------------+  Dist upper arm       0.22                               +-----------------+-------------+----------+--------------+  Antecubital fossa    0.14               not visualized  +-----------------+-------------+----------+--------------+  Prox forearm                            not visualized  +-----------------+-------------+----------+--------------+  Mid forearm                             not visualized  +-----------------+-------------+----------+--------------+   +-----------------+-------------+----------+--------------+  Left Cephalic    Diameter (cm)Depth (cm)   Findings     +-----------------+-------------+----------+--------------+  Prox upper arm                               AVF        +-----------------+-------------+----------+--------------+  Mid upper arm                                AVF        +-----------------+-------------+----------+--------------+  Dist upper arm                               AVF        +-----------------+-------------+----------+--------------+  Antecubital fossa                       not visualized  +-----------------+-------------+----------+--------------+  Prox forearm                            not visualized   +-----------------+-------------+----------+--------------+  Mid forearm                             not visualized  +-----------------+-------------+----------+--------------+  Dist forearm                            not visualized  +-----------------+-------------+----------+--------------+   +-----------------+-------------+----------+--------------+  Left Basilic     Diameter (cm)Depth (cm)   Findings     +-----------------+-------------+----------+--------------+  Prox upper arm                          not visualized  +-----------------+-------------+----------+--------------+  Mid upper arm        0.20                               +-----------------+-------------+----------+--------------+  Dist upper arm       0.10                               +-----------------+-------------+----------+--------------+  Antecubital fossa    0.09                               +-----------------+-------------+----------+--------------+  Prox forearm                            not visualized  +-----------------+-------------+----------+--------------+        Assessment/Plan:     78 year old male with failed left upper extremity cephalic vein fistula now dialyzing via catheter.  Most recent fistulogram demonstrated patent central veins.  Will plan to continue with left arm access given that he does not have suitable veins but today's ultrasound on either upper extremity.  He can continue aspirin  and we will plan for left arm graft on a nondialysis day in the near future.  All questions were answered he demonstrates good understanding.  Penne Lonni [place] MD [facility]      [1]  Allergies Allergen Reactions   Lopressor  [Metoprolol  Tartrate] Other (See Comments)    Severe bradycardia

## 2024-12-04 ENCOUNTER — Ambulatory Visit: Attending: Cardiology

## 2024-12-04 ENCOUNTER — Other Ambulatory Visit: Payer: Self-pay

## 2024-12-04 DIAGNOSIS — I493 Ventricular premature depolarization: Secondary | ICD-10-CM

## 2024-12-04 DIAGNOSIS — N186 End stage renal disease: Secondary | ICD-10-CM

## 2024-12-08 DIAGNOSIS — I493 Ventricular premature depolarization: Secondary | ICD-10-CM

## 2024-12-09 ENCOUNTER — Ambulatory Visit: Payer: Self-pay | Admitting: Cardiology

## 2024-12-11 MED ORDER — APIXABAN 5 MG PO TABS: 5.0000 mg | ORAL_TABLET | Freq: Two times a day (BID) | ORAL | 3 refills | Status: AC

## 2024-12-11 NOTE — Telephone Encounter (Signed)
 Eliquis  5 mg bid sent in to pt's pharmacy

## 2024-12-14 ENCOUNTER — Emergency Department (HOSPITAL_COMMUNITY)
Admission: EM | Admit: 2024-12-14 | Discharge: 2024-12-14 | Disposition: A | Discharge status: Home / Self Care | Attending: Emergency Medicine | Admitting: Emergency Medicine

## 2024-12-14 ENCOUNTER — Emergency Department (HOSPITAL_COMMUNITY)

## 2024-12-14 DIAGNOSIS — I2510 Atherosclerotic heart disease of native coronary artery without angina pectoris: Secondary | ICD-10-CM | POA: Insufficient documentation

## 2024-12-14 DIAGNOSIS — R059 Cough, unspecified: Secondary | ICD-10-CM | POA: Diagnosis not present

## 2024-12-14 DIAGNOSIS — N186 End stage renal disease: Secondary | ICD-10-CM | POA: Diagnosis not present

## 2024-12-14 DIAGNOSIS — R0789 Other chest pain: Secondary | ICD-10-CM | POA: Insufficient documentation

## 2024-12-14 DIAGNOSIS — J4489 Other specified chronic obstructive pulmonary disease: Secondary | ICD-10-CM | POA: Insufficient documentation

## 2024-12-14 DIAGNOSIS — Z79899 Other long term (current) drug therapy: Secondary | ICD-10-CM | POA: Insufficient documentation

## 2024-12-14 DIAGNOSIS — Z7901 Long term (current) use of anticoagulants: Secondary | ICD-10-CM | POA: Insufficient documentation

## 2024-12-14 DIAGNOSIS — Z96651 Presence of right artificial knee joint: Secondary | ICD-10-CM | POA: Diagnosis not present

## 2024-12-14 DIAGNOSIS — E1122 Type 2 diabetes mellitus with diabetic chronic kidney disease: Secondary | ICD-10-CM | POA: Diagnosis not present

## 2024-12-14 DIAGNOSIS — R531 Weakness: Secondary | ICD-10-CM | POA: Diagnosis not present

## 2024-12-14 DIAGNOSIS — I120 Hypertensive chronic kidney disease with stage 5 chronic kidney disease or end stage renal disease: Secondary | ICD-10-CM | POA: Insufficient documentation

## 2024-12-14 DIAGNOSIS — F0390 Unspecified dementia without behavioral disturbance: Secondary | ICD-10-CM | POA: Diagnosis not present

## 2024-12-14 DIAGNOSIS — Z992 Dependence on renal dialysis: Secondary | ICD-10-CM | POA: Diagnosis not present

## 2024-12-14 DIAGNOSIS — R079 Chest pain, unspecified: Secondary | ICD-10-CM | POA: Diagnosis present

## 2024-12-14 DIAGNOSIS — R0602 Shortness of breath: Secondary | ICD-10-CM | POA: Insufficient documentation

## 2024-12-14 LAB — CBC
HCT: 43.6 % (ref 39.0–52.0)
Hemoglobin: 13.7 g/dL (ref 13.0–17.0)
MCH: 32.9 pg (ref 26.0–34.0)
MCHC: 31.4 g/dL (ref 30.0–36.0)
MCV: 104.6 fL — ABNORMAL HIGH (ref 80.0–100.0)
Platelets: 155 K/uL (ref 150–400)
RBC: 4.17 MIL/uL — ABNORMAL LOW (ref 4.22–5.81)
RDW: 13.2 % (ref 11.5–15.5)
WBC: 7.3 K/uL (ref 4.0–10.5)
nRBC: 0 % (ref 0.0–0.2)

## 2024-12-14 LAB — BASIC METABOLIC PANEL WITH GFR
Anion gap: 14 (ref 5–15)
BUN: 42 mg/dL — ABNORMAL HIGH (ref 8–23)
CO2: 20 mmol/L — ABNORMAL LOW (ref 22–32)
Calcium: 8.6 mg/dL — ABNORMAL LOW (ref 8.9–10.3)
Chloride: 105 mmol/L (ref 98–111)
Creatinine, Ser: 4.43 mg/dL — ABNORMAL HIGH (ref 0.61–1.24)
GFR, Estimated: 13 mL/min — ABNORMAL LOW
Glucose, Bld: 105 mg/dL — ABNORMAL HIGH (ref 70–99)
Potassium: 4.5 mmol/L (ref 3.5–5.1)
Sodium: 139 mmol/L (ref 135–145)

## 2024-12-14 LAB — TROPONIN T, HIGH SENSITIVITY
Troponin T High Sensitivity: 72 ng/L — ABNORMAL HIGH (ref 0–19)
Troponin T High Sensitivity: 63 ng/L — ABNORMAL HIGH (ref 0–19)

## 2024-12-14 LAB — RESP PANEL BY RT-PCR (RSV, FLU A&B, COVID)  RVPGX2
Influenza A by PCR: NEGATIVE
Influenza B by PCR: NEGATIVE
Resp Syncytial Virus by PCR: NEGATIVE
SARS Coronavirus 2 by RT PCR: NEGATIVE

## 2024-12-14 NOTE — ED Provider Notes (Signed)
 " [place] EMERGENCY DEPARTMENT AT [facility] Provider Note  CSN: [id number] Arrival date & time: 12/14/24 1343  Chief Complaint(s) Shortness of Breath and Chest Pain  HPI Joseph Hebert. is a 78 y.o. male history of dementia, end-stage renal disease, COPD, CHF presenting to the emergency department with weakness.  Patient history is somewhat limited due to dementia.  He does not remember what happened.  Per EMS and wife heard from his HD center he was hooked up to the machine and then complained of chest pain and feel short of breath.  Wife reports that he has had some cough.  No fevers or chills.  Denies abdominal pain.  No clear urinary symptoms lately.  Did miss dialysis on Saturday, per wife it was because he refused to go to dialysis.  No other new symptoms.  Patient currently denies any complaints other than feeling heavy.  It is hard to determine exactly what he means by this but denies chest pain or shortness of breath.   Past Medical History Past Medical History:  Diagnosis Date   Acute encephalopathy    Acute hypoxemic respiratory failure (HCC)    Acute pulmonary edema (HCC)    Acute respiratory failure with hypoxia (HCC)    Anemia    Arrhythmia    PVCs; takes metoprolol  daily   Arthritis    Asthma    Bilateral leg pain 06/16/2015   CAD (coronary artery disease)    minimal, nonobstructive   Cardiac asystole (HCC)    after back surgery in 2018   Cardiogenic shock (HCC)    Carotid artery occlusion    s/p CEA in 2011   Chronic back pain    s/p lumbar fusion   COPD (chronic obstructive pulmonary disease) (HCC)    Spirva daily and Albuterol  as needed   Dementia (HCC)    Dementia (HCC)    Depression    Diabetes mellitus without complication (HCC)    Type 2 diet controlled.Never been on meds, does not check blood sugar   ESRD (end stage renal disease) (HCC)    t-th-sat dialysis   GERD (gastroesophageal reflux disease)    Gout    takes Uloric  daily    HCAP (healthcare-associated pneumonia)    Head injury    as a child   History of colon polyps    benign   History of gastric ulcer    History of hiatal hernia    Hyperlipidemia    takes Atorvastatin  and Fenofibrate  daily   Hypertension    takes Lisinopril ,Amlodipine ,and Hydralazine  daily   Intermittent claudication 07/18/2015   s/p LLE bypass   Lumbosacral spondylosis with radiculopathy 02/17/2016   Nevus of choroid of left eye 12/08/2014   Numbness    lower left leg and upper right thigh   PAD (peripheral artery disease)    Pneumonia    hx of-couple of yrs ago   S/P arteriovenous (AV) fistula creation - Left UE. 02-14-2024. 02/14/2024   Serous retinal detachment of left eye 12/08/2014   Shortness of breath dyspnea    with exertion   Status post intraocular lens implant 12/08/2014   Tuberculosis 2006    9 months    Vertigo    Wears glasses    Patient Active Problem List   Diagnosis Date Noted   Dialysis AV fistula malfunction 10/26/2024   Ventricular tachycardia (paroxysmal) (HCC) 10/24/2024   Nonspecific chest pain 10/24/2024   S/P arteriovenous (AV) fistula creation - Left UE. 02-14-2024. 02/14/2024  ESRD on dialysis (HCC) 02/12/2024   Dependent for wheelchair mobility 02/12/2024   Bilateral pleural effusion - s/p right thoracentesis on 02-13-2024 for 1000 ml. 02/12/2024   Hyperkalemia 02/12/2024   Metabolic acidosis 02/12/2024   Acute renal failure superimposed on stage 5 chronic kidney disease, not on chronic dialysis (HCC) 02/11/2024   History of anemia due to chronic kidney disease    Arrhythmia    Arthritis    Asthma    CAD (coronary artery disease)    Carotid artery occlusion    Chronic back pain    COPD (chronic obstructive pulmonary disease) (HCC)    Dementia (HCC)    Diabetes mellitus without complication (HCC)    Depression    GERD (gastroesophageal reflux disease)    Gout    Impaired memory    Numbness    Wears glasses    Vertigo    PVC (premature  ventricular contraction) 07/14/2020   Essential hypertension 01/01/2019   Hyperlipidemia 01/01/2019   PAD (peripheral artery disease) 01/01/2019   Acute respiratory failure with hypoxia (HCC)    Lumbosacral spondylosis with radiculopathy 02/17/2016   Intermittent claudication (HCC) 07/18/2015   Bilateral leg pain 06/16/2015   Serous retinal detachment of left eye 12/08/2014   Nevus of choroid of left eye 12/08/2014   Occlusion and stenosis of carotid artery without mention of cerebral infarction 01/01/2013   Home Medication(s) Prior to Admission medications  Medication Sig Start Date End Date Taking? Authorizing Provider  apixaban  (ELIQUIS ) 5 MG TABS tablet Take 1 tablet (5 mg total) by mouth 2 (two) times daily. 12/11/24   Tobb, Kardie, DO  atorvastatin  (LIPITOR ) 80 MG tablet Take 80 mg by mouth at bedtime. (2100)    [provider]  carvedilol  (COREG ) 3.125 MG tablet Take 1 tablet (3.125 mg total) by mouth 2 (two) times daily with a meal. 11/16/24   West, Katlyn D, NP  donepezil (ARICEPT) 5 MG tablet Take 5 mg by mouth at bedtime. 11/02/24   [provider]  Febuxostat  80 MG TABS Take 80 mg by mouth at bedtime.    [provider]  gabapentin  (NEURONTIN ) 300 MG capsule Take 300 mg by mouth at bedtime.    [provider]  hydrALAZINE  (APRESOLINE ) 25 MG tablet Take 1 tablet (25 mg total) by mouth at bedtime. 10/26/24   Kandis Perkins, DO  levothyroxine  (SYNTHROID ) 75 MCG tablet Take 75 mcg by mouth daily. 02/06/24   [provider]  memantine  (NAMENDA ) 10 MG tablet Take 1 tablet by mouth 2 (two) times daily. 10/29/23   [provider]  SENSIPAR 30 MG tablet Take 30 mg by mouth every Monday, Wednesday, and Friday. 05/13/24   [provider]  sevelamer  carbonate (RENVELA ) 800 MG tablet Take 800 mg by mouth 3 (three) times daily.    [provider]  Past Surgical History Past Surgical History:  Procedure Laterality Date   A/V SHUNT INTERVENTION N/A 05/15/2024   Procedure: A/V SHUNT INTERVENTION;  Surgeon: Serene Gaile ORN, MD;  Location: [facility];  Service: Cardiovascular;  Laterality: N/A;   A/V SHUNT INTERVENTION Left 08/05/2024   Procedure: A/V SHUNT INTERVENTION;  Surgeon: Pearline Norman RAMAN, MD;  Location: [facility];  Service: Cardiovascular;  Laterality: Left;   ABDOMINAL AORTOGRAM W/LOWER EXTREMITY N/A 02/20/2019   Procedure: ABDOMINAL AORTOGRAM W/LOWER EXTREMITY;  Surgeon: Harvey Carlin BRAVO, MD;  Location: [facility];  Service: Cardiovascular;  Laterality: N/A;   ABDOMINAL EXPOSURE N/A 01/22/2017   Procedure: ABDOMINAL EXPOSURE;  Surgeon: Penne Lonni [place], MD;  Location: [facility] OR;  Service: Vascular;  Laterality: N/A;   ANTERIOR LAT LUMBAR FUSION N/A 01/22/2017   Procedure: LUMBAR THREE-FOUR, LUMBAR FOUR-FIVE ANTERIOR LATERAL INTERBODY FUSION;  Surgeon: Morene Hicks Ditty, MD;  Location: [facility] OR;  Service: Neurosurgery;  Laterality: N/A;  L3-4 L4-5 Lateral interbody fusion   ANTERIOR LUMBAR FUSION N/A 01/22/2017   Procedure: LUMBAR FIVE-SACRUM ONE ANTERIOR LUMBAR INTERBODY FUSION WITH ABDOMINAL APPROACH BY DR COLORADO;  Surgeon: Morene Hicks Ditty, MD;  Location: [facility] OR;  Service: Neurosurgery;  Laterality: N/A;   AV FISTULA PLACEMENT Left 02/14/2024   Procedure: LEFT ARM BRACHIOCEPHALIC ARTERIOVENOUS FISTULA CREATION;  Surgeon: Colorado Penne Lonni, MD;  Location: [facility] OR;  Service: Vascular;  Laterality: Left;   CAROTID BODY TUMOR EXCISION Left 09/21/2015   Procedure: EXCISE LEFT NECK NODULE WITH LOCAL ;  Surgeon: Carlin BRAVO Harvey, MD;  Location: [facility] OR;  Service: Vascular;  Laterality: Left;   CAROTID ENDARTERECTOMY  10/31/2010   LEFT cea   cataract surgery Bilateral    COLONOSCOPY  10/1997   in [place]   EYE SURGERY     FEMORAL-POPLITEAL BYPASS GRAFT Left 08/23/2015    Procedure: LEFT FEMORAL-POPLITEAL ARTERY BYPASS WITH GORETEX GRAFT;  Surgeon: Carlin BRAVO Harvey, MD;  Location: [facility] OR;  Service: Vascular;  Laterality: Left;   FEMORAL-POPLITEAL BYPASS GRAFT Left 02/23/2019   Procedure: REDO LEFT FEMORAL TO POPLITEAL ARTERY BYPASS GRAFT Using Propaten graft;  Surgeon: Harvey Carlin BRAVO, MD;  Location: [facility] OR;  Service: Vascular;  Laterality: Left;   FISTULA SUPERFICIALIZATION Left 06/10/2024   Procedure: FISTULA SUPERFICIALIZATION;  Surgeon: Serene Gaile ORN, MD;  Location: [facility] OR;  Service: Vascular;  Laterality: Left;   IR FLUORO GUIDE CV LINE RIGHT  02/12/2024   IR THORACENTESIS ASP PLEURAL SPACE W/IMG GUIDE  02/13/2024   IR US  GUIDE VASC ACCESS RIGHT  02/12/2024   JOINT REPLACEMENT  1980   RIGHT  knee   LIGATION OF COMPETING BRANCHES OF ARTERIOVENOUS FISTULA Left 06/10/2024   Procedure: LIGATION OF COMPETING BRANCHES OF ARTERIOVENOUS FISTULA;  Surgeon: Serene Gaile ORN, MD;  Location: [facility] OR;  Service: Vascular;  Laterality: Left;   LUMBAR EPIDURAL INJECTION     LUMBAR LAMINECTOMY/DECOMPRESSION MICRODISCECTOMY Right 02/17/2016   Procedure: Laminectomy and Foraminotomy - Lumbar four-five right;  Surgeon: Morene Hicks Ditty, MD;  Location: [facility];  Service: Neurosurgery;  Laterality: Right;  right   LUMBAR PERCUTANEOUS PEDICLE SCREW 3 LEVEL N/A 01/22/2017   Procedure: LUMBAR THREE-SACRAL ONE PERCUTANEOUS PEDICLE SCREW FIXATION WITH ROBOTIC ASSISTANCE;  Surgeon: Morene Hicks Ditty, MD;  Location: [facility] OR;  Service: Neurosurgery;  Laterality: N/A;  L3 to S1 Percutaneous pedicle screw fixation   PERIPHERAL VASCULAR CATHETERIZATION N/A 06/24/2015   Procedure: Abdominal Aortogram;  Surgeon: Carlin BRAVO Harvey, MD;  Location: [facility] INVASIVE CV LAB;  Service:  Cardiovascular;  Laterality: N/A;   Stents  08/08/1998   Bilateral iliofemoral  stents, [facility]   TONSILLECTOMY     VENOUS ANGIOPLASTY  05/15/2024   Procedure: VENOUS ANGIOPLASTY;  Surgeon: Serene Gaile ORN,  MD;  Location: [facility];  Service: Cardiovascular;;   VENOUS ANGIOPLASTY  08/05/2024   Procedure: VENOUS ANGIOPLASTY;  Surgeon: Pearline Norman RAMAN, MD;  Location: [facility];  Service: Cardiovascular;;   Family History Family History  Problem Relation Age of Onset   Diabetes Mother    Hyperlipidemia Father    Hypertension Father    Other Father        Right Leg Amputation   Heart disease Sister        Aneyrism     Social History Social History[1] Allergies Lopressor  [metoprolol  tartrate]  Review of Systems Review of Systems  All other systems reviewed and are negative.   Physical Exam Vital Signs  I have reviewed the triage vital signs BP (!) 189/55 (BP Location: Right Arm)   Pulse 70   Temp 97.6 F (36.4 C) (Oral)   Resp 17   SpO2 100%  Physical Exam Vitals and nursing note reviewed.  Constitutional:      General: He is not in acute distress.    Appearance: Normal appearance. He is obese.  HENT:     Mouth/Throat:     Mouth: Mucous membranes are moist.  Eyes:     Conjunctiva/sclera: Conjunctivae normal.  Cardiovascular:     Rate and Rhythm: Normal rate and regular rhythm.  Pulmonary:     Effort: Pulmonary effort is normal. No respiratory distress.     Breath sounds: Normal breath sounds.  Abdominal:     General: Abdomen is flat.     Palpations: Abdomen is soft.     Tenderness: There is no abdominal tenderness.  Musculoskeletal:     Right lower leg: No edema.     Left lower leg: No edema.  Skin:    General: Skin is warm and dry.     Capillary Refill: Capillary refill takes less than 2 seconds.  Neurological:     Mental Status: He is alert. Mental status is at baseline.  Psychiatric:        Mood and Affect: Mood normal.        Behavior: Behavior normal.     ED Results and Treatments Labs (all labs ordered are listed, but only abnormal results are displayed) Labs Reviewed  BASIC METABOLIC PANEL WITH GFR - Abnormal; Notable for the following  components:      Result Value   CO2 20 (*)    Glucose, Bld 105 (*)    BUN 42 (*)    Creatinine, Ser 4.43 (*)    Calcium  8.6 (*)    GFR, Estimated 13 (*)    All other components within normal limits  CBC - Abnormal; Notable for the following components:   RBC 4.17 (*)    MCV 104.6 (*)    All other components within normal limits  TROPONIN T, HIGH SENSITIVITY - Abnormal; Notable for the following components:   Troponin T High Sensitivity 72 (*)    All other components within normal limits  TROPONIN T, HIGH SENSITIVITY - Abnormal; Notable for the following components:   Troponin T High Sensitivity 63 (*)    All other components within normal limits  RESP PANEL BY RT-PCR (RSV, FLU A&B, COVID)  RVPGX2  URINALYSIS, W/ REFLEX TO CULTURE (INFECTION SUSPECTED)  Radiology DG Chest 2 View Result Date: 12/14/2024 EXAM: 2 VIEW(S) XRAY OF THE CHEST 12/14/2024 03:03:00 PM COMPARISON: 10/24/2024 CLINICAL HISTORY: SOB/chest pain FINDINGS: LINES, TUBES AND DEVICES: Stable right dialysis catheter with tip overlying the right atrium. LUNGS AND PLEURA: No focal pulmonary opacity. No pleural effusion. No pneumothorax. HEART AND MEDIASTINUM: Aortic calcification. BONES AND SOFT TISSUES: No acute osseous abnormality. IMPRESSION: 1. No acute cardiopulmonary abnormality. Electronically signed by: Morgane Naveau MD 12/14/2024 05:18 PM EST RP Workstation: [id number]    Pertinent labs & imaging results that were available during my care of the patient were reviewed by me and considered in my medical decision making (see MDM for details).  Medications Ordered in ED Medications - No data to display                                                                                                                                   Procedures Procedures  (including critical care time)  Medical  Decision Making / ED Course   MDM:  78 year old presenting to the emergency department with episode of chest pain and shortness of breath while at dialysis.  Patient overall well-appearing, his examination is overall nonfocal.  His lungs are clear.  He has no lower extremity swelling.  He is in no respiratory distress.  History is difficult due to his underlying dementia, he does not remember what happened when he went to dialysis.  Currently appears without complaint.  His EKG has no acute process.  His troponin is elevated, will check delta troponin, possibly in the setting of end-stage renal disease, if this is stable lower concern for acute process such as ACS.  Considered other process such as pneumonia, chest x-ray obtained without evidence of acute process.  Flu and COVID testing is negative.  Will reassess.  Clinical Course as of 12/14/24 2216  Mon Dec 14, 2024  2215 Repeat troponin is stable/downtrending.  Patient is well-appearing.  He has been resting comfortably in the hall.  He has no evidence of any need for acute dialysis at this time.  Feel patient can be discharged to home.  Low concern for acute process.  Recommended return precautions and compliance with dialysis scheduling. [WS]    Clinical Course User Index [WS] Francesca Elsie CROME, MD     Additional history obtained: -Additional history obtained from family and ems -External records from outside source obtained and reviewed including: Chart review including previous notes, labs, imaging, consultation notes including prior notes    Lab Tests: -I ordered, reviewed, and interpreted labs.   The pertinent results include:   Labs Reviewed  BASIC METABOLIC PANEL WITH GFR - Abnormal; Notable for the following components:      Result Value   CO2 20 (*)    Glucose, Bld 105 (*)    BUN 42 (*)    Creatinine, Ser 4.43 (*)    Calcium  8.6 (*)    GFR, Estimated  13 (*)    All other components within normal limits  CBC -  Abnormal; Notable for the following components:   RBC 4.17 (*)    MCV 104.6 (*)    All other components within normal limits  TROPONIN T, HIGH SENSITIVITY - Abnormal; Notable for the following components:   Troponin T High Sensitivity 72 (*)    All other components within normal limits  TROPONIN T, HIGH SENSITIVITY - Abnormal; Notable for the following components:   Troponin T High Sensitivity 63 (*)    All other components within normal limits  RESP PANEL BY RT-PCR (RSV, FLU A&B, COVID)  RVPGX2  URINALYSIS, W/ REFLEX TO CULTURE (INFECTION SUSPECTED)    Notable for ESRD, downtrending troponin elevation in setting of ESRD  EKG   EKG Interpretation Date/Time:  Monday December 14 2024 13:50:23 EST Ventricular Rate:  78 PR Interval:  134 QRS Duration:  98 QT Interval:  426 QTC Calculation: 485 R Axis:   7  Text Interpretation: Sinus rhythm with occasional Premature ventricular complexes Possible Anterior infarct , age undetermined Abnormal ECG When compared with ECG of 16-Nov-2024 13:25, No significant change since last tracing Confirmed by Francesca Fallow ([id number]) on 12/14/2024 5:29:56 PM         Imaging Studies ordered: I ordered imaging studies including CXR On my interpretation imaging demonstrates no acute process I independently visualized and interpreted imaging. I agree with the radiologist interpretation   Medicines ordered and prescription drug management: No orders of the defined types were placed in this encounter.   -I have reviewed the patients home medicines and have made adjustments as needed  Reevaluation: After the interventions noted above, I reevaluated the patient and found that their symptoms have resolved  Co morbidities that complicate the patient evaluation  Past Medical History:  Diagnosis Date   Acute encephalopathy    Acute hypoxemic respiratory failure (HCC)    Acute pulmonary edema (HCC)    Acute respiratory failure with hypoxia (HCC)     Anemia    Arrhythmia    PVCs; takes metoprolol  daily   Arthritis    Asthma    Bilateral leg pain 06/16/2015   CAD (coronary artery disease)    minimal, nonobstructive   Cardiac asystole (HCC)    after back surgery in 2018   Cardiogenic shock (HCC)    Carotid artery occlusion    s/p CEA in 2011   Chronic back pain    s/p lumbar fusion   COPD (chronic obstructive pulmonary disease) (HCC)    Spirva daily and Albuterol  as needed   Dementia (HCC)    Dementia (HCC)    Depression    Diabetes mellitus without complication (HCC)    Type 2 diet controlled.Never been on meds, does not check blood sugar   ESRD (end stage renal disease) (HCC)    t-th-sat dialysis   GERD (gastroesophageal reflux disease)    Gout    takes Uloric  daily   HCAP (healthcare-associated pneumonia)    Head injury    as a child   History of colon polyps    benign   History of gastric ulcer    History of hiatal hernia    Hyperlipidemia    takes Atorvastatin  and Fenofibrate  daily   Hypertension    takes Lisinopril ,Amlodipine ,and Hydralazine  daily   Intermittent claudication 07/18/2015   s/p LLE bypass   Lumbosacral spondylosis with radiculopathy 02/17/2016   Nevus of choroid of left eye 12/08/2014   Numbness    lower  left leg and upper right thigh   PAD (peripheral artery disease)    Pneumonia    hx of-couple of yrs ago   S/P arteriovenous (AV) fistula creation - Left UE. 02-14-2024. 02/14/2024   Serous retinal detachment of left eye 12/08/2014   Shortness of breath dyspnea    with exertion   Status post intraocular lens implant 12/08/2014   Tuberculosis 2006    9 months    Vertigo    Wears glasses       Dispostion: Disposition decision including need for hospitalization was considered, and patient discharged from emergency department.    Final Clinical Impression(s) / ED Diagnoses Final diagnoses:  Atypical chest pain  Dementia, unspecified dementia severity, unspecified dementia type,  unspecified whether behavioral, psychotic, or mood disturbance or anxiety (HCC)     This chart was dictated using voice recognition software.  Despite best efforts to proofread,  errors can occur which can change the documentation meaning.     [1]  Social History Tobacco Use   Smoking status: Every Day    Current packs/day: 1.00    Types: Cigarettes   Smokeless tobacco: Never   Tobacco comments:    1ppd  Vaping Use   Vaping status: Never Used  Substance Use Topics   Alcohol  use: No    Alcohol /week: 0.0 standard drinks of alcohol    Drug use: No     Francesca Elsie CROME, MD 12/14/24 2216  "

## 2024-12-14 NOTE — ED Triage Notes (Signed)
 Pt BIB EMS from Dialysis. Pt missed 1 week of dialysis. Was just hooked up to machine but had not started treatment when started to feel SOB and having chest pain.   Sinus rhythm for EMS  EMS vital  120/80 80hr Cbg 127 95% Room air  Hx COPD  2.5 albuteral given.   No cough.

## 2024-12-14 NOTE — Discharge Instructions (Addendum)
 We evaluated you for your chest pain at dialysis.  Your testing in the emergency department is reassuring.  We do not know the exact cause of your symptoms but think it is safe to go home.  Please continue to go to your regularly scheduled dialysis appointments and return to the emergency department as needed.

## 2024-12-16 ENCOUNTER — Encounter (HOSPITAL_COMMUNITY)

## 2024-12-16 ENCOUNTER — Other Ambulatory Visit (HOSPITAL_COMMUNITY)

## 2024-12-16 ENCOUNTER — Ambulatory Visit

## 2024-12-23 ENCOUNTER — Telehealth (HOSPITAL_BASED_OUTPATIENT_CLINIC_OR_DEPARTMENT_OTHER): Payer: Self-pay | Admitting: *Deleted

## 2024-12-23 ENCOUNTER — Encounter (HOSPITAL_COMMUNITY): Payer: Self-pay | Admitting: Vascular Surgery

## 2024-12-23 NOTE — Telephone Encounter (Signed)
"  ° °  Pre-operative Risk Assessment    Patient Name: Joseph Hebert.  DOB: 09-Mar-1946 MRN: [id number]   Date of last office visit: 11/16/24 ROSABEL MOSE, NP Date of next office visit: 12/30/24 DR. KRASOWSKI (4-6 WK F/U PER KATLYN WEST, NP)   Request for Surgical Clearance    Procedure:  AVG PLACEMENT  Date of Surgery:  Clearance TBD                                Surgeon:  DR. PENNE COLORADO Surgeon's Group or Practice Name:  [facility] Phone number:  [phone] Fax number:  [phone]   Type of Clearance Requested:   - Medical ; FORM STATES CARDIAC CLEARANCE , DOES NOT STATE IF NEED PHARM CLEARANCE FOR BLOOD THINNER   Type of Anesthesia:  CHOICE   Additional requests/questions:    Signed, Vallerie Hentz   12/23/2024, 12:40 PM   "

## 2024-12-23 NOTE — Telephone Encounter (Signed)
 Will update all parties the pt has appt 12/30/24 Dr. Krasowski. Will be sure appt notes reflect pt needs preop clearance.

## 2024-12-23 NOTE — Telephone Encounter (Signed)
" ° °  Name: Joseph Hebert.  DOB: 11/19/1946  MRN: [id number]  Primary Cardiologist: Kardie Tobb, DO  Chart reviewed as part of pre-operative protocol coverage. Because of Joseph LEVITT Jr.'s past medical history and recent ED visit for chest pain and shortness of breath, he will require a follow-up in-office visit in order to better assess preoperative cardiovascular risk.  He has an appointment with Dr. Bernie on 1/14  Pre-op  covering staff: - Please schedule appointment and call patient to inform them. If patient already had an upcoming appointment within acceptable timeframe, please add pre-op  clearance to the appointment notes so provider is aware. - Please contact requesting surgeon's office via preferred method (i.e, phone, fax) to inform them of need for appointment prior to surgery.    [name] Louder, PA-C  12/23/2024, 1:26 PM   "

## 2024-12-25 ENCOUNTER — Ambulatory Visit (HOSPITAL_COMMUNITY): Admission: RE | Admit: 2024-12-25 | Source: Home / Self Care | Admitting: Vascular Surgery

## 2024-12-25 ENCOUNTER — Encounter (HOSPITAL_COMMUNITY): Admission: RE | Payer: Self-pay | Source: Home / Self Care

## 2024-12-25 SURGERY — INSERTION, GRAFT, ARTERIOVENOUS, UPPER EXTREMITY
Anesthesia: Choice | Laterality: Left

## 2024-12-29 ENCOUNTER — Other Ambulatory Visit: Payer: Self-pay

## 2024-12-29 DIAGNOSIS — N186 End stage renal disease: Secondary | ICD-10-CM | POA: Insufficient documentation

## 2024-12-29 DIAGNOSIS — I10 Essential (primary) hypertension: Secondary | ICD-10-CM | POA: Insufficient documentation

## 2024-12-29 DIAGNOSIS — D649 Anemia, unspecified: Secondary | ICD-10-CM | POA: Insufficient documentation

## 2024-12-30 ENCOUNTER — Encounter: Payer: Self-pay | Admitting: Cardiology

## 2024-12-30 ENCOUNTER — Ambulatory Visit: Attending: Cardiology | Admitting: Cardiology

## 2024-12-30 VITALS — BP 120/70 | HR 72 | Ht 70.0 in | Wt 186.0 lb

## 2024-12-30 DIAGNOSIS — I480 Paroxysmal atrial fibrillation: Secondary | ICD-10-CM | POA: Diagnosis not present

## 2024-12-30 DIAGNOSIS — I2510 Atherosclerotic heart disease of native coronary artery without angina pectoris: Secondary | ICD-10-CM

## 2024-12-30 DIAGNOSIS — I10 Essential (primary) hypertension: Secondary | ICD-10-CM | POA: Diagnosis not present

## 2024-12-30 DIAGNOSIS — Z9889 Other specified postprocedural states: Secondary | ICD-10-CM | POA: Diagnosis not present

## 2024-12-30 DIAGNOSIS — Z992 Dependence on renal dialysis: Secondary | ICD-10-CM

## 2024-12-30 DIAGNOSIS — N186 End stage renal disease: Secondary | ICD-10-CM | POA: Diagnosis not present

## 2024-12-30 DIAGNOSIS — I472 Ventricular tachycardia, unspecified: Secondary | ICD-10-CM

## 2024-12-30 DIAGNOSIS — Z01818 Encounter for other preprocedural examination: Secondary | ICD-10-CM

## 2024-12-30 NOTE — Patient Instructions (Signed)
Medication Instructions:  ?Your physician recommends that you continue on your current medications as directed. Please refer to the Current Medication list given to you today.  ?*If you need a refill on your cardiac medications before your next appointment, please call your pharmacy* ? ? ?Lab Work: ?None Ordered ?If you have labs (blood work) drawn today and your tests are completely normal, you will receive your results only by: ?MyChart Message (if you have MyChart) OR ?A paper copy in the mail ?If you have any lab test that is abnormal or we need to change your treatment, we will call you to review the results. ? ? ?Testing/Procedures: ?Your physician has requested that you have a lexiscan myoview. For further information please visit [email or web address]. Please follow instruction sheet, as given. ? ?The test will take approximately 3 to 4 hours to complete; you may bring reading material.  If someone comes with you to your appointment, they will need to remain in the main lobby due to limited space in the testing area. ? ?How to prepare for your Myocardial Perfusion Test: ?Do not eat or drink 3 hours prior to your test, except you may have water. ?Do not consume products containing caffeine (regular or decaffeinated) 12 hours prior to your test. (ex: coffee, chocolate, sodas, tea). ?Do bring a list of your current medications with you.  If not listed below, you may take your medications as normal. ?Do wear comfortable clothes (no dresses or overalls) and walking shoes, tennis shoes preferred (No heels or open toe shoes are allowed). ?Do NOT wear cologne, perfume, aftershave, or lotions (deodorant is allowed). ?If these instructions are not followed, your test will have to be rescheduled.   ? ? ?Follow-Up: ?At [facility], you and your health needs are our priority.  As part of our continuing mission to provide you with exceptional heart care, we have created designated Provider Care Teams.  These Care Teams  include your primary Cardiologist (physician) and Advanced Practice Providers (APPs -  Physician Assistants and Nurse Practitioners) who all work together to provide you with the care you need, when you need it. ? ?We recommend signing up for the patient portal called "MyChart".  Sign up information is provided on this After Visit Summary.  MyChart is used to connect with patients for Virtual Visits (Telemedicine).  Patients are able to view lab/test results, encounter notes, upcoming appointments, etc.  Non-urgent messages can be sent to your provider as well.   ?To learn more about what you can do with MyChart, go to [email or web address].   ? ?Your next appointment:   ?3 month(s) ? ?The format for your next appointment:   ?In Person ? ?Provider:   ?Jenne Campus, MD  ? ? ?Other Instructions ?NA  ?

## 2024-12-30 NOTE — Addendum Note (Signed)
 Addended by: ARLOA MALLORY D on: 12/30/2024 04:50 PM   Modules accepted: Orders

## 2024-12-30 NOTE — Progress Notes (Signed)
 " Cardiology Office Note:    Date:  12/30/2024   ID:  Joseph Hebert., DOB [birth year], MRN [id number]  PCP:  Keren Vicenta BRAVO, MD  Cardiologist:  Lamar Fitch, MD    Referring MD: Keren Vicenta BRAVO, MD   Chief Complaint  Patient presents with   Pre-op  Exam   Follow-up    History of Present Illness:    Joseph Hebert. is a 79 y.o. male past medical history significant for end-stage renal disease, he is on hemodialysis, he does have double-lumen catheter for more than a year.  Apparently he did have AV fistula on the left arm however that seems to be failing he is scheduled for what I understand right now and not a fistula in the same arm with some grafting done.  Additional problem includes bilateral, iliac stents, hypothyroidism, tobacco abuse which is still ongoing, he did have bradycardia with a systolic cardiac arrest in February 2019 following anterior cervical fusion.  It was felt to be related to metoprolol  it vagotonia, paroxysmal atrial fibrillation recently discovered, on anticoagulation.  He was referred to us  for more than the stent for evaluation before surgery for graft insertion on the left arm for dialysis access.  He comes to office with his wife who participated in decision making.  He does have some history of dementia and history is somewhat difficult to obtain from him.  He always answer with some jokes.  Denies having any chest pain recently but was in the emergency room few weeks ago because of episode of chest pain.  Troponin was flat, EKG showed no acute changes, echocardiogram showed ejection fraction 50 to 55% without segmental wall motion abnormality.  While he was in the emergency room he did have runs of nonsustained ventricular tachycardia and he was given meth carvedilol  3.125 after that.  Then he wore monitor which showed episodes of atrial fibrillation total burden 5%.  He denies have any complaints with the office he does not remember having any chest  pain before.  Past Medical History:  Diagnosis Date   Acute renal failure superimposed on stage 5 chronic kidney disease, not on chronic dialysis (HCC) 02/11/2024   Acute respiratory failure with hypoxia (HCC)    Anemia    Arrhythmia    PVCs; takes metoprolol  daily   Arthritis    Asthma    Bilateral leg pain 06/16/2015   Bilateral pleural effusion - s/p right thoracentesis on 02-13-2024 for 1000 ml. 02/12/2024   CAD (coronary artery disease)    minimal, nonobstructive   Carotid artery occlusion    s/p CEA in 2011   Chronic back pain    s/p lumbar fusion   COPD (chronic obstructive pulmonary disease) (HCC)    Spirva daily and Albuterol  as needed   Dementia (HCC)    Dependent for wheelchair mobility 02/12/2024   Depression    Diabetes mellitus without complication (HCC)    Type 2 diet controlled.Never been on meds, does not check blood sugar   Dialysis AV fistula malfunction 10/26/2024   ESRD (end stage renal disease) (HCC)    t-th-sat dialysis   ESRD on dialysis (HCC) 02/12/2024   Essential hypertension 01/01/2019   GERD (gastroesophageal reflux disease)    Gout    takes Uloric  daily   History of anemia due to chronic kidney disease    Hyperkalemia 02/12/2024   Hyperlipidemia    takes Atorvastatin  and Fenofibrate  daily   Hypertension    takes Lisinopril ,Amlodipine ,and Hydralazine  daily  Impaired memory    takes Namenda  daily     Intermittent claudication 07/18/2015   s/p LLE bypass   Lumbosacral spondylosis with radiculopathy 02/17/2016   Metabolic acidosis 02/12/2024   Nevus of choroid of left eye 12/08/2014   Nonspecific chest pain 10/24/2024   Numbness    lower left leg and upper right thigh   PAD (peripheral artery disease)    PVC (premature ventricular contraction) 07/14/2020   S/P arteriovenous (AV) fistula creation - Left UE. 02-14-2024. 02/14/2024   Serous retinal detachment of left eye 12/08/2014   Ventricular tachycardia (paroxysmal) (HCC) 10/24/2024    Vertigo    Wears glasses     Past Surgical History:  Procedure Laterality Date   A/V SHUNT INTERVENTION N/A 05/15/2024   Procedure: A/V SHUNT INTERVENTION;  Surgeon: Serene Gaile ORN, MD;  Location: [facility];  Service: Cardiovascular;  Laterality: N/A;   A/V SHUNT INTERVENTION Left 08/05/2024   Procedure: A/V SHUNT INTERVENTION;  Surgeon: Pearline Norman RAMAN, MD;  Location: [facility];  Service: Cardiovascular;  Laterality: Left;   ABDOMINAL AORTOGRAM W/LOWER EXTREMITY N/A 02/20/2019   Procedure: ABDOMINAL AORTOGRAM W/LOWER EXTREMITY;  Surgeon: Harvey Carlin BRAVO, MD;  Location: [facility];  Service: Cardiovascular;  Laterality: N/A;   ABDOMINAL EXPOSURE N/A 01/22/2017   Procedure: ABDOMINAL EXPOSURE;  Surgeon: Penne Lonni Colorado, MD;  Location: [facility] OR;  Service: Vascular;  Laterality: N/A;   ANTERIOR LAT LUMBAR FUSION N/A 01/22/2017   Procedure: LUMBAR THREE-FOUR, LUMBAR FOUR-FIVE ANTERIOR LATERAL INTERBODY FUSION;  Surgeon: Morene Hicks Ditty, MD;  Location: [facility] OR;  Service: Neurosurgery;  Laterality: N/A;  L3-4 L4-5 Lateral interbody fusion   ANTERIOR LUMBAR FUSION N/A 01/22/2017   Procedure: LUMBAR FIVE-SACRUM ONE ANTERIOR LUMBAR INTERBODY FUSION WITH ABDOMINAL APPROACH BY DR COLORADO;  Surgeon: Morene Hicks Ditty, MD;  Location: [facility] OR;  Service: Neurosurgery;  Laterality: N/A;   AV FISTULA PLACEMENT Left 02/14/2024   Procedure: LEFT ARM BRACHIOCEPHALIC ARTERIOVENOUS FISTULA CREATION;  Surgeon: Colorado Penne Lonni, MD;  Location: [facility] OR;  Service: Vascular;  Laterality: Left;   CAROTID BODY TUMOR EXCISION Left 09/21/2015   Procedure: EXCISE LEFT NECK NODULE WITH LOCAL ;  Surgeon: Carlin BRAVO Harvey, MD;  Location: [facility] OR;  Service: Vascular;  Laterality: Left;   CAROTID ENDARTERECTOMY  10/31/2010   LEFT cea   cataract surgery Bilateral    COLONOSCOPY  10/1997   in [facility]   EYE SURGERY     FEMORAL-POPLITEAL BYPASS GRAFT Left 08/23/2015   Procedure: LEFT FEMORAL-POPLITEAL  ARTERY BYPASS WITH GORETEX GRAFT;  Surgeon: Carlin BRAVO Harvey, MD;  Location: [facility] OR;  Service: Vascular;  Laterality: Left;   FEMORAL-POPLITEAL BYPASS GRAFT Left 02/23/2019   Procedure: REDO LEFT FEMORAL TO POPLITEAL ARTERY BYPASS GRAFT Using Propaten graft;  Surgeon: Harvey Carlin BRAVO, MD;  Location: [facility] OR;  Service: Vascular;  Laterality: Left;   FISTULA SUPERFICIALIZATION Left 06/10/2024   Procedure: FISTULA SUPERFICIALIZATION;  Surgeon: Serene Gaile ORN, MD;  Location: [facility] OR;  Service: Vascular;  Laterality: Left;   IR FLUORO GUIDE CV LINE RIGHT  02/12/2024   IR THORACENTESIS RIGHT ASP PLEURAL SPACE W/IMG GUIDE  02/13/2024   IR US  GUIDE VASC ACCESS RIGHT  02/12/2024   JOINT REPLACEMENT  1980   RIGHT  knee   LIGATION OF COMPETING BRANCHES OF ARTERIOVENOUS FISTULA Left 06/10/2024   Procedure: LIGATION OF COMPETING BRANCHES OF ARTERIOVENOUS FISTULA;  Surgeon: Serene Gaile ORN, MD;  Location: [facility] OR;  Service: Vascular;  Laterality: Left;   LUMBAR  EPIDURAL INJECTION     LUMBAR LAMINECTOMY/DECOMPRESSION MICRODISCECTOMY Right 02/17/2016   Procedure: Laminectomy and Foraminotomy - Lumbar four-five right;  Surgeon: Morene Hicks Ditty, MD;  Location: [facility];  Service: Neurosurgery;  Laterality: Right;  right   LUMBAR PERCUTANEOUS PEDICLE SCREW 3 LEVEL N/A 01/22/2017   Procedure: LUMBAR THREE-SACRAL ONE PERCUTANEOUS PEDICLE SCREW FIXATION WITH ROBOTIC ASSISTANCE;  Surgeon: Morene Hicks Ditty, MD;  Location: [facility] OR;  Service: Neurosurgery;  Laterality: N/A;  L3 to S1 Percutaneous pedicle screw fixation   PERIPHERAL VASCULAR CATHETERIZATION N/A 06/24/2015   Procedure: Abdominal Aortogram;  Surgeon: Carlin FORBES Haddock, MD;  Location: [facility] INVASIVE CV LAB;  Service: Cardiovascular;  Laterality: N/A;   Stents  08/08/1998   Bilateral iliofemoral  stents, [facility]   TONSILLECTOMY     VENOUS ANGIOPLASTY  05/15/2024   Procedure: VENOUS ANGIOPLASTY;  Surgeon: Serene Gaile ORN, MD;  Location: [facility];   Service: Cardiovascular;;   VENOUS ANGIOPLASTY  08/05/2024   Procedure: VENOUS ANGIOPLASTY;  Surgeon: Pearline Norman RAMAN, MD;  Location: [facility];  Service: Cardiovascular;;    Current Medications: Active Medications[1]   Allergies:   Lopressor  [metoprolol  tartrate]   Social History   Socioeconomic History   Marital status: Married    Spouse name: Not on file   Number of children: Not on file   Years of education: Not on file   Highest education level: Not on file  Occupational History   Not on file  Tobacco Use   Smoking status: Every Day    Current packs/day: 1.00    Types: Cigarettes   Smokeless tobacco: Never   Tobacco comments:    1ppd  Vaping Use   Vaping status: Never Used  Substance and Sexual Activity   Alcohol  use: No    Alcohol /week: 0.0 standard drinks of alcohol    Drug use: No   Sexual activity: Yes  Other Topics Concern   Not on file  Social History Narrative   Not on file   Social Drivers of Health   Tobacco Use: High Risk (12/30/2024)   Patient History    Smoking Tobacco Use: Every Day    Smokeless Tobacco Use: Never    Passive Exposure: Not on file  Financial Resource Strain: Not on file  Food Insecurity: No Food Insecurity (10/24/2024)   Epic    Worried About Programme Researcher, Broadcasting/film/video in the Last Year: Never true    [employer] of Food in the Last Year: Never true  Transportation Needs: No Transportation Needs (10/24/2024)   Epic    Lack of Transportation (Medical): No    Lack of Transportation (Non-Medical): No  Physical Activity: Not on file  Stress: Not on file  Social Connections: Moderately Integrated (10/24/2024)   Social Connection and Isolation Panel    Frequency of Communication with Friends and Family: More than three times a week    Frequency of Social Gatherings with Friends and Family: Once a week    Attends Religious Services: Never    Database Administrator or Organizations: Yes    Attends Banker Meetings: Never     Marital Status: Married  Depression (PHQ2-9): Not on file  Alcohol  Screen: Not on file  Housing: Low Risk (10/24/2024)   Epic    Unable to Pay for Housing in the Last Year: No    Number of Times Moved in the Last Year: 0    Homeless in the Last Year: No  Utilities: Not At Risk (  10/24/2024)   Epic    Threatened with loss of utilities: No  Health Literacy: Not on file     Family History: The patient's family history includes Diabetes in his mother; Heart disease in his sister; Hyperlipidemia in his father; Hypertension in his father; Other in his father. ROS:   Please see the history of present illness.    All 14 point review of systems negative except as described per history of present illness  EKGs/Labs/Other Studies Reviewed:         Recent Labs: 02/14/2024: ALT 6 10/24/2024: B Natriuretic Peptide 216.7 10/26/2024: Magnesium  2.2 12/14/2024: BUN 42; Creatinine, Ser 4.43; Hemoglobin 13.7; Platelets 155; Potassium 4.5; Sodium 139  Recent Lipid Panel    Component Value Date/Time   CHOL 83 10/25/2024 0552   TRIG 62 10/25/2024 0552   HDL 39 (L) 10/25/2024 0552   CHOLHDL 2.1 10/25/2024 0552   VLDL 12 10/25/2024 0552   LDLCALC 32 10/25/2024 0552    Physical Exam:    VS:  BP 120/70   Pulse 72   Ht 5' 10 (1.778 m)   Wt 186 lb (84.4 kg)   SpO2 97%   BMI 26.69 kg/m     Wt Readings from Last 3 Encounters:  12/30/24 186 lb (84.4 kg)  11/16/24 186 lb (84.4 kg)  10/24/24 187 lb 2.7 oz (84.9 kg)     GEN:  Well nourished, well developed in no acute distress HEENT: Normal NECK: No JVD; No carotid bruits LYMPHATICS: No lymphadenopathy CARDIAC: RRR, no murmurs, no rubs, no gallops RESPIRATORY:  Clear to auscultation without rales, wheezing or rhonchi  ABDOMEN: Soft, non-tender, non-distended MUSCULOSKELETAL:  No edema; No deformity  SKIN: Warm and dry LOWER EXTREMITIES: no swelling NEUROLOGIC:  Alert and oriented x 3 PSYCHIATRIC:  Normal affect   ASSESSMENT:    1.  Primary hypertension   2. Pre-op  evaluation   3. Coronary artery disease involving native coronary artery of native heart without angina pectoris   4. Ventricular tachycardia (paroxysmal) (HCC)   5. ESRD on dialysis (HCC)   6. S/P arteriovenous (AV) fistula creation - Left UE. 02-14-2024.   7. Paroxysmal atrial fibrillation (HCC)    PLAN:    In order of problems listed above:  Evaluation before surgery I am not sure exactly what current anesthesia he will have, however with his multiple risk factors as well as episode of chest pain in spite of the fact he does have some dementia I think there is some value in determining where the pain is coming from and which way to progress, therefore, I think doing Lexiscan  will be reasonable.  In the meantime we will continue present management. Paroxysmal atrial fibrillation maintaining sinus rhythm, continue anticoagulation, if anticoagulation need to be interrupted temporarily up to 2 days before surgical intervention will be acceptable. End-stage renal disease on hemodialysis. History of ventricular tachycardia on beta-blocker.  I do think he be candidate for ICD.   Medication Adjustments/Labs and Tests Ordered: Current medicines are reviewed at length with the patient today.  Concerns regarding medicines are outlined above.  Orders Placed This Encounter  Procedures   MYOCARDIAL PERFUSION IMAGING   EKG 12-Lead   Medication changes: No orders of the defined types were placed in this encounter.   Signed, Lamar DOROTHA Fitch, MD, [facility] 12/30/2024 4:37 PM    [facility]    [1]  Current Meds  Medication Sig   apixaban  (ELIQUIS ) 5 MG TABS tablet Take 1 tablet (5  mg total) by mouth 2 (two) times daily.   atorvastatin  (LIPITOR ) 80 MG tablet Take 80 mg by mouth at bedtime. (2100)   carvedilol  (COREG ) 3.125 MG tablet Take 1 tablet (3.125 mg total) by mouth 2 (two) times daily with a meal.   donepezil (ARICEPT) 5 MG tablet  Take 5 mg by mouth at bedtime.   Febuxostat  80 MG TABS Take 80 mg by mouth at bedtime.   gabapentin  (NEURONTIN ) 300 MG capsule Take 300 mg by mouth at bedtime.   hydrALAZINE  (APRESOLINE ) 25 MG tablet Take 1 tablet (25 mg total) by mouth at bedtime.   levothyroxine  (SYNTHROID ) 75 MCG tablet Take 75 mcg by mouth daily.   memantine  (NAMENDA ) 10 MG tablet Take 1 tablet by mouth 2 (two) times daily.   SENSIPAR 30 MG tablet Take 30 mg by mouth See admin instructions. Monday and Friday   sevelamer  carbonate (RENVELA ) 800 MG tablet Take 800 mg by mouth 3 (three) times daily.   "

## 2024-12-31 ENCOUNTER — Telehealth: Payer: Self-pay | Admitting: Cardiology

## 2024-12-31 NOTE — Telephone Encounter (Signed)
 Returned call to [facility]. Explained pts office visit yesterday and the need to know the anesthesia he would be having for his surgery.

## 2024-12-31 NOTE — Telephone Encounter (Signed)
 Joseph Hebert from [facility] calling to see why a pt surgery was canceled  1/9. Please advise.

## 2025-01-05 ENCOUNTER — Telehealth: Payer: Self-pay | Admitting: *Deleted

## 2025-01-05 NOTE — Telephone Encounter (Signed)
 [name] wife Ronal, given stress test instructions. Wife plans to stay with husband during test.

## 2025-01-06 ENCOUNTER — Ambulatory Visit: Attending: Cardiology

## 2025-01-06 DIAGNOSIS — Z01818 Encounter for other preprocedural examination: Secondary | ICD-10-CM

## 2025-01-06 MED ORDER — TECHNETIUM TC 99M TETROFOSMIN IV KIT
29.2000 | PACK | Freq: Once | INTRAVENOUS | Status: AC | PRN
  Administered 2025-01-06: 29.2 via INTRAVENOUS

## 2025-01-06 MED ORDER — REGADENOSON 0.4 MG/5ML IV SOLN
0.4000 mg | Freq: Once | INTRAVENOUS | Status: AC
  Administered 2025-01-06: 0.4 mg via INTRAVENOUS

## 2025-01-06 MED ORDER — TECHNETIUM TC 99M TETROFOSMIN IV KIT
11.0000 | PACK | Freq: Once | INTRAVENOUS | Status: AC | PRN
  Administered 2025-01-06: 11 via INTRAVENOUS

## 2025-01-08 LAB — MYOCARDIAL PERFUSION IMAGING
LV dias vol: 88 mL (ref 62–150)
LV sys vol: 45 mL
Nuc Stress EF: 48 %
Peak HR: 71 {beats}/min
Rest HR: 59 {beats}/min
Rest Nuclear Isotope Dose: 11 mCi
SDS: 4
SRS: 0
SSS: 4
ST Depression (mm): 0 mm
Stress Nuclear Isotope Dose: 29.2 mCi
TID: 0.82

## 2025-01-12 ENCOUNTER — Ambulatory Visit: Payer: Self-pay | Admitting: Cardiology

## 2025-01-15 ENCOUNTER — Other Ambulatory Visit: Payer: Self-pay

## 2025-01-15 DIAGNOSIS — N186 End stage renal disease: Secondary | ICD-10-CM

## 2025-02-03 ENCOUNTER — Ambulatory Visit (HOSPITAL_COMMUNITY)

## 2025-02-03 ENCOUNTER — Ambulatory Visit

## 2025-02-05 ENCOUNTER — Ambulatory Visit (HOSPITAL_COMMUNITY): Admit: 2025-02-05 | Admitting: Vascular Surgery

## 2025-03-04 ENCOUNTER — Ambulatory Visit: Admitting: Cardiology

## 2025-03-08 ENCOUNTER — Ambulatory Visit: Admitting: Cardiology
